# Patient Record
Sex: Male | Born: 1945 | Race: White | Hispanic: No | Marital: Married | Smoking: Former smoker
Health system: Southern US, Community
[De-identification: ages and names within clinical notes are randomized; demographics above are authoritative.]

## PROBLEM LIST (undated history)

## (undated) DIAGNOSIS — R7989 Other specified abnormal findings of blood chemistry: Secondary | ICD-10-CM

## (undated) DIAGNOSIS — R945 Abnormal results of liver function studies: Secondary | ICD-10-CM

## (undated) DIAGNOSIS — I350 Nonrheumatic aortic (valve) stenosis: Secondary | ICD-10-CM

## (undated) DIAGNOSIS — H269 Unspecified cataract: Secondary | ICD-10-CM

## (undated) DIAGNOSIS — R42 Dizziness and giddiness: Secondary | ICD-10-CM

## (undated) DIAGNOSIS — R0602 Shortness of breath: Secondary | ICD-10-CM

## (undated) DIAGNOSIS — H3581 Retinal edema: Secondary | ICD-10-CM

## (undated) DIAGNOSIS — Z953 Presence of xenogenic heart valve: Secondary | ICD-10-CM

## (undated) DIAGNOSIS — M1990 Unspecified osteoarthritis, unspecified site: Secondary | ICD-10-CM

## (undated) DIAGNOSIS — M48061 Spinal stenosis, lumbar region without neurogenic claudication: Secondary | ICD-10-CM

## (undated) DIAGNOSIS — K219 Gastro-esophageal reflux disease without esophagitis: Secondary | ICD-10-CM

## (undated) DIAGNOSIS — K08109 Complete loss of teeth, unspecified cause, unspecified class: Secondary | ICD-10-CM

## (undated) DIAGNOSIS — C61 Malignant neoplasm of prostate: Secondary | ICD-10-CM

## (undated) DIAGNOSIS — I10 Essential (primary) hypertension: Secondary | ICD-10-CM

## (undated) DIAGNOSIS — R0600 Dyspnea, unspecified: Secondary | ICD-10-CM

## (undated) DIAGNOSIS — F419 Anxiety disorder, unspecified: Secondary | ICD-10-CM

## (undated) DIAGNOSIS — R399 Unspecified symptoms and signs involving the genitourinary system: Secondary | ICD-10-CM

## (undated) DIAGNOSIS — I447 Left bundle-branch block, unspecified: Secondary | ICD-10-CM

## (undated) DIAGNOSIS — R739 Hyperglycemia, unspecified: Secondary | ICD-10-CM

## (undated) DIAGNOSIS — I442 Atrioventricular block, complete: Secondary | ICD-10-CM

## (undated) DIAGNOSIS — E039 Hypothyroidism, unspecified: Secondary | ICD-10-CM

## (undated) DIAGNOSIS — R011 Cardiac murmur, unspecified: Secondary | ICD-10-CM

## (undated) DIAGNOSIS — M5126 Other intervertebral disc displacement, lumbar region: Secondary | ICD-10-CM

## (undated) DIAGNOSIS — E785 Hyperlipidemia, unspecified: Secondary | ICD-10-CM

## (undated) HISTORY — DX: Dizziness and giddiness: R42

## (undated) HISTORY — DX: Other specified abnormal findings of blood chemistry: R79.89

## (undated) HISTORY — PX: CHOLECYSTECTOMY: SHX55

## (undated) HISTORY — DX: Abnormal results of liver function studies: R94.5

## (undated) HISTORY — DX: Hypothyroidism, unspecified: E03.9

## (undated) HISTORY — PX: COLONOSCOPY: SHX174

## (undated) HISTORY — DX: Unspecified osteoarthritis, unspecified site: M19.90

## (undated) HISTORY — PX: INGUINAL HERNIA REPAIR: SUR1180

## (undated) HISTORY — DX: Unspecified cataract: H26.9

## (undated) HISTORY — DX: Gastro-esophageal reflux disease without esophagitis: K21.9

## (undated) HISTORY — DX: Retinal edema: H35.81

## (undated) HISTORY — PX: EYE SURGERY: SHX253

## (undated) HISTORY — DX: Essential (primary) hypertension: I10

## (undated) HISTORY — DX: Hyperlipidemia, unspecified: E78.5

## (undated) HISTORY — PX: TONSILLECTOMY: SUR1361

## (undated) HISTORY — DX: Nonrheumatic aortic (valve) stenosis: I35.0

## (undated) HISTORY — DX: Atrioventricular block, complete: I44.2

## (undated) HISTORY — PX: POLYPECTOMY: SHX149

## (undated) HISTORY — DX: Hyperglycemia, unspecified: R73.9

## (undated) HISTORY — PX: CATARACT EXTRACTION W/ INTRAOCULAR LENS IMPLANT: SHX1309

---
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
## 2000-11-05 ENCOUNTER — Encounter: Payer: Self-pay | Admitting: Urology

## 2000-11-05 ENCOUNTER — Inpatient Hospital Stay (HOSPITAL_COMMUNITY): Admission: EM | Admit: 2000-11-05 | Discharge: 2000-11-11 | Payer: Self-pay | Admitting: Urology

## 2000-11-06 ENCOUNTER — Encounter: Payer: Self-pay | Admitting: Urology

## 2000-11-07 ENCOUNTER — Encounter: Payer: Self-pay | Admitting: Internal Medicine

## 2000-11-07 ENCOUNTER — Encounter: Payer: Self-pay | Admitting: Urology

## 2000-11-10 HISTORY — PX: CARDIAC CATHETERIZATION: SHX172

## 2000-12-29 ENCOUNTER — Other Ambulatory Visit: Admission: RE | Admit: 2000-12-29 | Discharge: 2000-12-29 | Payer: Self-pay | Admitting: Gastroenterology

## 2000-12-29 ENCOUNTER — Encounter (INDEPENDENT_AMBULATORY_CARE_PROVIDER_SITE_OTHER): Payer: Self-pay

## 2001-06-26 ENCOUNTER — Observation Stay (HOSPITAL_COMMUNITY): Admission: RE | Admit: 2001-06-26 | Discharge: 2001-06-27 | Payer: Self-pay | Admitting: General Surgery

## 2001-06-26 ENCOUNTER — Encounter: Payer: Self-pay | Admitting: General Surgery

## 2001-06-26 ENCOUNTER — Encounter (INDEPENDENT_AMBULATORY_CARE_PROVIDER_SITE_OTHER): Payer: Self-pay | Admitting: Specialist

## 2001-06-26 HISTORY — PX: LAPAROSCOPIC CHOLECYSTECTOMY: SUR755

## 2004-05-16 ENCOUNTER — Ambulatory Visit: Payer: Self-pay | Admitting: Internal Medicine

## 2004-07-25 ENCOUNTER — Ambulatory Visit: Payer: Self-pay | Admitting: Internal Medicine

## 2004-10-23 ENCOUNTER — Ambulatory Visit: Payer: Self-pay | Admitting: Internal Medicine

## 2004-10-29 ENCOUNTER — Ambulatory Visit: Payer: Self-pay | Admitting: Internal Medicine

## 2005-02-22 ENCOUNTER — Ambulatory Visit: Payer: Self-pay | Admitting: Internal Medicine

## 2005-04-30 ENCOUNTER — Ambulatory Visit: Payer: Self-pay | Admitting: Internal Medicine

## 2005-08-12 ENCOUNTER — Ambulatory Visit: Payer: Self-pay | Admitting: Internal Medicine

## 2005-08-26 ENCOUNTER — Ambulatory Visit: Payer: Self-pay | Admitting: Internal Medicine

## 2006-03-10 ENCOUNTER — Ambulatory Visit: Payer: Self-pay | Admitting: Internal Medicine

## 2006-03-27 ENCOUNTER — Ambulatory Visit: Payer: Self-pay | Admitting: Gastroenterology

## 2006-04-10 ENCOUNTER — Encounter: Payer: Self-pay | Admitting: Internal Medicine

## 2006-04-10 ENCOUNTER — Ambulatory Visit: Payer: Self-pay | Admitting: Gastroenterology

## 2006-05-05 ENCOUNTER — Ambulatory Visit: Payer: Self-pay | Admitting: Internal Medicine

## 2006-05-05 LAB — CONVERTED CEMR LAB
ALT: 39 units/L (ref 0–40)
AST: 41 units/L — ABNORMAL HIGH (ref 0–37)
Chol/HDL Ratio, serum: 3.8
Cholesterol: 163 mg/dL (ref 0–200)
HDL: 42.6 mg/dL (ref >39.0)
LDL DIRECT: 103.4 mg/dL
Total CK: 234 units/L (ref 7–195)
Triglyceride fasting, serum: 115 mg/dL (ref 0–149)
VLDL: 23 mg/dL (ref 0–40)

## 2006-06-06 ENCOUNTER — Ambulatory Visit: Payer: Self-pay | Admitting: Internal Medicine

## 2006-06-06 LAB — CONVERTED CEMR LAB: Total CK: 159 units/L (ref 7–195)

## 2006-06-10 HISTORY — PX: INGUINAL HERNIA REPAIR: SUR1180

## 2006-06-10 HISTORY — PX: CHOLECYSTECTOMY: SHX55

## 2006-07-29 ENCOUNTER — Ambulatory Visit: Payer: Self-pay | Admitting: Family Medicine

## 2006-08-20 ENCOUNTER — Ambulatory Visit: Payer: Self-pay | Admitting: Family Medicine

## 2007-04-08 DIAGNOSIS — I10 Essential (primary) hypertension: Secondary | ICD-10-CM | POA: Insufficient documentation

## 2007-04-13 ENCOUNTER — Ambulatory Visit: Payer: Self-pay | Admitting: Internal Medicine

## 2007-08-24 ENCOUNTER — Telehealth (INDEPENDENT_AMBULATORY_CARE_PROVIDER_SITE_OTHER): Payer: Self-pay | Admitting: *Deleted

## 2007-10-13 ENCOUNTER — Ambulatory Visit: Payer: Self-pay | Admitting: Internal Medicine

## 2007-10-13 DIAGNOSIS — E785 Hyperlipidemia, unspecified: Secondary | ICD-10-CM | POA: Insufficient documentation

## 2007-10-13 DIAGNOSIS — E039 Hypothyroidism, unspecified: Secondary | ICD-10-CM

## 2007-10-15 LAB — CONVERTED CEMR LAB
ALT: 59 units/L — ABNORMAL HIGH (ref 0–53)
AST: 52 units/L — ABNORMAL HIGH (ref 0–37)
Alkaline Phosphatase: 58 units/L (ref 39–117)
Total Bilirubin: 1.6 mg/dL — ABNORMAL HIGH (ref 0.3–1.2)

## 2008-04-07 ENCOUNTER — Ambulatory Visit: Payer: Self-pay | Admitting: Internal Medicine

## 2008-04-12 LAB — CONVERTED CEMR LAB
ALT: 45 units/L (ref 0–53)
AST: 41 units/L — ABNORMAL HIGH (ref 0–37)
Alkaline Phosphatase: 67 units/L (ref 39–117)
Bilirubin, Direct: 0.2 mg/dL (ref 0.0–0.3)
CO2: 27 meq/L (ref 19–32)
Chloride: 106 meq/L (ref 96–112)
Eosinophils Relative: 0.7 % (ref 0.0–5.0)
GFR calc Af Amer: 110 mL/min
Glucose, Bld: 95 mg/dL (ref 70–99)
HDL: 42.3 mg/dL (ref >39.0)
LDL Cholesterol: 79 mg/dL (ref 0–99)
Lymphocytes Relative: 22.8 % (ref 12.0–46.0)
Monocytes Relative: 9 % (ref 3.0–12.0)
Neutrophils Relative %: 67.3 % (ref 43.0–77.0)
Platelets: 240 10*3/uL (ref 150–400)
Potassium: 3.7 meq/L (ref 3.5–5.1)
RDW: 12 % (ref 11.5–14.6)
Saturation Ratios: 18.9 % — ABNORMAL LOW (ref 20.0–50.0)
Sodium: 142 meq/L (ref 135–145)
TSH: 3.08 microintl units/mL (ref 0.35–5.50)
Total CHOL/HDL Ratio: 3.5
Total Protein: 8.1 g/dL (ref 6.0–8.3)
Triglycerides: 126 mg/dL (ref 0–149)
WBC: 5.6 10*3/uL (ref 4.5–10.5)

## 2008-10-10 ENCOUNTER — Ambulatory Visit: Payer: Self-pay | Admitting: Internal Medicine

## 2008-10-13 ENCOUNTER — Telehealth (INDEPENDENT_AMBULATORY_CARE_PROVIDER_SITE_OTHER): Payer: Self-pay | Admitting: *Deleted

## 2008-10-13 LAB — CONVERTED CEMR LAB
PSA: 1 ng/mL (ref 0.10–4.00)
TSH: 4.52 microintl units/mL (ref 0.35–5.50)

## 2008-11-25 ENCOUNTER — Ambulatory Visit: Payer: Self-pay | Admitting: Internal Medicine

## 2009-04-13 ENCOUNTER — Ambulatory Visit: Payer: Self-pay | Admitting: Internal Medicine

## 2009-04-17 ENCOUNTER — Encounter (INDEPENDENT_AMBULATORY_CARE_PROVIDER_SITE_OTHER): Payer: Self-pay | Admitting: *Deleted

## 2009-06-10 DIAGNOSIS — Z8679 Personal history of other diseases of the circulatory system: Secondary | ICD-10-CM | POA: Insufficient documentation

## 2009-06-10 HISTORY — DX: Personal history of other diseases of the circulatory system: Z86.79

## 2009-10-11 ENCOUNTER — Ambulatory Visit: Payer: Self-pay | Admitting: Internal Medicine

## 2009-11-09 ENCOUNTER — Ambulatory Visit: Payer: Self-pay | Admitting: Internal Medicine

## 2009-11-09 ENCOUNTER — Encounter: Payer: Self-pay | Admitting: Internal Medicine

## 2009-11-09 ENCOUNTER — Ambulatory Visit: Payer: Self-pay

## 2009-11-09 ENCOUNTER — Ambulatory Visit (HOSPITAL_COMMUNITY): Admission: RE | Admit: 2009-11-09 | Discharge: 2009-11-09 | Payer: Self-pay | Admitting: Internal Medicine

## 2010-04-13 ENCOUNTER — Ambulatory Visit: Payer: Self-pay | Admitting: Internal Medicine

## 2010-04-13 DIAGNOSIS — M25519 Pain in unspecified shoulder: Secondary | ICD-10-CM | POA: Insufficient documentation

## 2010-04-17 LAB — CONVERTED CEMR LAB
ALT: 31 units/L (ref 0–53)
BUN: 13 mg/dL (ref 6–23)
Basophils Absolute: 0 10*3/uL (ref 0.0–0.1)
Basophils Relative: 0.5 % (ref 0.0–3.0)
Creatinine, Ser: 0.9 mg/dL (ref 0.4–1.5)
Eosinophils Absolute: 0 10*3/uL (ref 0.0–0.7)
Eosinophils Relative: 1.2 % (ref 0.0–5.0)
GFR calc non Af Amer: 89.08 mL/min (ref >60)
HDL: 41.9 mg/dL (ref >39.00)
Lymphs Abs: 1.1 10*3/uL (ref 0.7–4.0)
MCV: 94.1 fL (ref 78.0–100.0)
Monocytes Relative: 10.1 % (ref 3.0–12.0)
Neutro Abs: 2.1 10*3/uL (ref 1.4–7.7)
Neutrophils Relative %: 57.3 % (ref 43.0–77.0)
PSA: 1.16 ng/mL (ref 0.10–4.00)
Platelets: 215 10*3/uL (ref 150.0–400.0)
Total CHOL/HDL Ratio: 4
VLDL: 27.2 mg/dL (ref 0.0–40.0)
WBC: 3.7 10*3/uL — ABNORMAL LOW (ref 4.5–10.5)

## 2010-07-08 LAB — CONVERTED CEMR LAB
ALT: 42 units/L (ref 0–53)
ALT: 69 units/L — ABNORMAL HIGH (ref 0–53)
AST: 63 units/L — ABNORMAL HIGH (ref 0–37)
BUN: 11 mg/dL (ref 6–23)
Basophils Absolute: 0 10*3/uL (ref 0.0–0.1)
Basophils Relative: 0.4 % (ref 0.0–1.0)
CO2: 31 meq/L (ref 19–32)
Calcium: 9.5 mg/dL (ref 8.4–10.5)
Chloride: 103 meq/L (ref 96–112)
Cholesterol: 155 mg/dL (ref 0–200)
Creatinine, Ser: 1 mg/dL (ref 0.4–1.5)
Eosinophils Absolute: 0.1 10*3/uL (ref 0.0–0.6)
Eosinophils Relative: 1 % (ref 0.0–5.0)
Eosinophils Relative: 1.2 % (ref 0.0–5.0)
GFR calc Af Amer: 98 mL/min
GFR calc non Af Amer: 80.15 mL/min (ref >60)
GFR calc non Af Amer: 81 mL/min
Glucose, Bld: 101 mg/dL — ABNORMAL HIGH (ref 70–99)
Glucose, Bld: 109 mg/dL — ABNORMAL HIGH (ref 70–99)
HCT: 42.1 % (ref 39.0–52.0)
HCT: 46.7 % (ref 39.0–52.0)
HDL: 37.9 mg/dL — ABNORMAL LOW (ref >39.0)
Hemoglobin: 14.4 g/dL (ref 13.0–17.0)
Hemoglobin: 15.7 g/dL (ref 13.0–17.0)
LDL Cholesterol: 80 mg/dL (ref 0–99)
LDL Cholesterol: 81 mg/dL (ref 0–99)
Lymphocytes Relative: 15.4 % (ref 12.0–46.0)
Lymphs Abs: 1 10*3/uL (ref 0.7–4.0)
MCHC: 33.6 g/dL (ref 30.0–36.0)
MCV: 93.6 fL (ref 78.0–100.0)
Monocytes Absolute: 0.7 10*3/uL (ref 0.2–0.7)
Monocytes Relative: 11.7 % (ref 3.0–12.0)
Monocytes Relative: 8.8 % (ref 3.0–11.0)
Neutro Abs: 3.1 10*3/uL (ref 1.4–7.7)
Neutro Abs: 6 10*3/uL (ref 1.4–7.7)
Neutrophils Relative %: 74.4 % (ref 43.0–77.0)
PSA, Free Pct: 34 (ref >25)
PSA, Free: 0.3 ng/mL
PSA: 0.87 ng/mL (ref 0.10–4.00)
PSA: 1 ng/mL (ref 0.10–4.00)
Platelets: 251 10*3/uL (ref 150–400)
Potassium: 3.7 meq/L (ref 3.5–5.1)
Potassium: 3.8 meq/L (ref 3.5–5.1)
RBC: 4.98 M/uL (ref 4.22–5.81)
RDW: 11.9 % (ref 11.5–14.6)
RDW: 12.2 % (ref 11.5–14.6)
Sodium: 141 meq/L (ref 135–145)
Sodium: 142 meq/L (ref 135–145)
TSH: 3.05 microintl units/mL (ref 0.35–5.50)
TSH: 5.75 microintl units/mL — ABNORMAL HIGH (ref 0.35–5.50)
Total Bilirubin: 1.7 mg/dL — ABNORMAL HIGH (ref 0.3–1.2)
Total CHOL/HDL Ratio: 3
Total CHOL/HDL Ratio: 4.1
Total CK: 136 units/L (ref 7–195)
Triglycerides: 180 mg/dL — ABNORMAL HIGH (ref 0–149)
VLDL: 20.8 mg/dL (ref 0.0–40.0)
VLDL: 36 mg/dL (ref 0–40)
WBC: 4.7 10*3/uL (ref 4.5–10.5)
WBC: 8 10*3/uL (ref 4.5–10.5)

## 2010-07-10 NOTE — Assessment & Plan Note (Signed)
Summary: 6 MONTH FOLLOWUP///SPH   Vital Signs:  Patient profile:   65 year old male Height:      68 inches Weight:      192.8 pounds BMI:     29.42 Pulse rate:   74 / minute BP sitting:   140 / 92  Vitals Entered By: Shary Decamp (Oct 11, 2009 8:21 AM) CC: rov, fasting Comments  - pt is concerned because he has heard about lawsuits on TV [id number] Shary Decamp  Oct 11, 2009 8:21 AM    History of Present Illness: --pt is concerned because he has heard about lawsuits on TV [id number] --Bp slightly  elevated today, was elevated last time as well   Current Medications (verified): 1)  Levothyroxine Sodium 75 Mcg Tabs (Levothyroxine Sodium) .Marland Kitchen.. 1 By Mouth Once Daily 2)  Metoprolol Tartrate 50 Mg  Tabs (Metoprolol Tartrate) .Marland Kitchen.. 1 By Mouth Bid 3)  Nifedical Xl 30 Mg  Tb24 (Nifedipine) .Marland Kitchen.. 1 By Mouth Qd 4)  Aspirin 81 Mg  Tbec (Aspirin) 5)  Crestor 20 Mg  Tabs (Rosuvastatin Calcium) .Marland Kitchen.. 1 By Mouth Qd  Allergies (verified): 1)  ! * Zetia 2)  ! Lipitor  Past History:  Past Medical History: Reviewed history from 10/10/2008 and no changes required. Hypertension Hyperlipidemia abnormal LFTs Hypothyroidism  Past Surgical History: Reviewed history from 04/13/2007 and no changes required. Cholecystectomy Cardiac Cath. (11-10-00) Tonsillectomy Inguinal herniorrhaphy (B)  Social History: Reviewed history from 04/13/2009 and no changes required. Married 3 children Retired 12-09, enjoying!, traveling  exercise-- remains active diet- does try to eat healthy  tobacco--no ETOH--no   Review of Systems CV:  Denies chest pain or discomfort and swelling of feet; no dyspnea on exertion. Resp:  Denies cough. MS:  Denies muscle aches and cramps.  Physical Exam  General:  alert, well-developed, and well-nourished.   Lungs:  normal respiratory effort, no intercostal retractions, no accessory muscle use, and normal breath sounds.   Heart:  normal rate and regular rhythm.   II/VI systolic murmur, better heard at the  right upper sternum Extremities:  no pretibial edema bilaterally    Impression & Recommendations:  Problem # 1:  SYSTOLIC MURMUR (KXF-818.2)  patient is asymptomatic  check an echocardiogram  Orders: Cardiology Referral (Cardiology)  Problem # 2:  HYPOTHYROIDISM (ICD-244.9)  His updated medication list for this problem includes:    Levothyroxine Sodium 75 Mcg Tabs (Levothyroxine sodium) .Marland Kitchen... 1 by mouth once daily  Orders: Venipuncture (99371) TLB-TSH (Thyroid Stimulating Hormone) (84443-TSH)  Problem # 3:  HYPERLIPIDEMIA (ICD-272.4) printed material provided regards Crestor from up-to-date provided. At this point   I think he gets more benefits  than risks from cholesterol  medication.  His updated medication list for this problem includes:    Crestor 20 Mg Tabs (Rosuvastatin calcium) .Marland Kitchen... 1 by mouth qd  Labs Reviewed: SGOT: 64 (04/13/2009)   SGPT: 42 (04/13/2009)   HDL:44.50 (04/13/2009), 42.3 (04/07/2008)  LDL:80 (04/13/2009), 79 (04/07/2008)  Chol:145 (04/13/2009), 146 (04/07/2008)  Trig:104.0 (04/13/2009), 126 (04/07/2008)  Problem # 4:  HYPERTENSION (ICD-401.9) BP recheck today, 130/70.  No change His updated medication list for this problem includes:    Metoprolol Tartrate 50 Mg Tabs (Metoprolol tartrate) .Marland Kitchen... 1 by mouth bid    Nifedical Xl 30 Mg Tb24 (Nifedipine) .Marland Kitchen... 1 by mouth qd  BP today: 140/92 Prior BP: 160/90 (04/13/2009)  Labs Reviewed: K+: 3.7 (04/13/2009) Creat: : 1.0 (04/13/2009)   Chol: 145 (04/13/2009)   HDL: 44.50 (04/13/2009)  LDL: 80 (04/13/2009)   TG: 104.0 (04/13/2009)  Complete Medication List: 1)  Levothyroxine Sodium 75 Mcg Tabs (Levothyroxine sodium) .Marland Kitchen.. 1 by mouth once daily 2)  Metoprolol Tartrate 50 Mg Tabs (Metoprolol tartrate) .Marland Kitchen.. 1 by mouth bid 3)  Nifedical Xl 30 Mg Tb24 (Nifedipine) .Marland Kitchen.. 1 by mouth qd 4)  Aspirin 81 Mg Tbec (Aspirin) 5)  Crestor 20 Mg Tabs (Rosuvastatin calcium)  .Marland Kitchen.. 1 by mouth qd  Patient Instructions: 1)  Please schedule a follow-up appointment in 6 months .( fasting, yearly checkup)

## 2010-07-10 NOTE — Assessment & Plan Note (Signed)
Summary: yearly check & lab/cbs   Vital Signs:  Patient profile:   65 year old male Height:      68 inches Weight:      195.38 pounds Pulse rate:   78 / minute Pulse rhythm:   regular BP sitting:   126 / 82  (left arm) Cuff size:   large  Vitals Entered By: Army Fossa CMA (April 13, 2010 7:58 AM) CC: CPX, fasting  Comments c/o (L) shoulder pain x 1 year.  [facility] [address]   History of Present Illness: CPX left shoulder pain for one month, on -off,  worse after periods of inactivity.    Preventive Screening-Counseling & Management  Caffeine-Diet-Exercise     Does Patient Exercise: no  Current Medications (verified): 1)  Levothyroxine Sodium 75 Mcg Tabs (Levothyroxine Sodium) .Marland Kitchen.. 1 By Mouth Once Daily 2)  Metoprolol Tartrate 50 Mg  Tabs (Metoprolol Tartrate) .Marland Kitchen.. 1 By Mouth Bid 3)  Nifedical Xl 30 Mg  Tb24 (Nifedipine) .Marland Kitchen.. 1 By Mouth Qd 4)  Aspirin 81 Mg  Tbec (Aspirin) 5)  Crestor 20 Mg  Tabs (Rosuvastatin Calcium) .Marland Kitchen.. 1 By Mouth Qd  Allergies (verified): 1)  ! * Zetia 2)  ! Lipitor  Past History:  Past Medical History: Reviewed history from 10/10/2008 and no changes required. Hypertension Hyperlipidemia abnormal LFTs Hypothyroidism  Past Surgical History: Reviewed history from 04/13/2007 and no changes required. Cholecystectomy Cardiac Cath. (11-10-00) Tonsillectomy Inguinal herniorrhaphy (B)  Family History: Reviewed history from 04/07/2008 and no changes required.  Diabetes-- M , ?B CAD Male 1st degree relative <50, brother had CABG in his 42s stroke-- mother colon ca--no prostate ca--no  Social History: Married 3 children Retired 12-09 exercise-- remains active diet- -- regular  tobacco--no ETOH--no Does Patient Exercise:  no  Review of Systems General:  Denies fatigue, fever, and weight loss. CV:  Denies chest pain or discomfort and swelling of feet. Resp:  Denies cough and shortness of breath. GI:  Denies bloody stools,  nausea, and vomiting. GU:  Denies dysuria, hematuria, urinary frequency, and urinary hesitancy. Psych:  Denies anxiety and depression.  Physical Exam  General:  alert, well-developed, and well-nourished.   Neck:  no masses and no thyromegaly.   Lungs:  normal respiratory effort, no intercostal retractions, no accessory muscle use, and normal breath sounds.   Heart:  normal rate and regular rhythm.  Very mild  systolic murmur, better heard at the  right upper sternum Abdomen:  soft, non-tender, no distention, no masses, and no guarding.   Rectal:  No external abnormalities noted. Normal sphincter tone. No rectal masses or tenderness. Prostate:  Prostate gland firm and smooth, no enlargement, nodularity, tenderness, mass, asymmetry or induration. Msk:  right shoulder normal Left shoulder without deformities, slightly painful with elevation Extremities:  no lower  extremity edema   Impression & Recommendations:  Problem # 1:  HEALTH SCREENING (ICD-V70.0)  Td 2002 got a  flu shot  per  patient: Cscope x 2, last  04-2006, next 2014 (Dr. Arlyce Dice) diet and exercise discussed    Orders: Venipuncture (16109) TLB-BMP (Basic Metabolic Panel-BMET) (80048-METABOL) TLB-CBC Platelet - w/Differential (85025-CBCD) TLB-Lipid Panel (80061-LIPID) TLB-PSA (Prostate Specific Antigen) (84153-PSA) TLB-ALT (SGPT) (84460-ALT) TLB-AST (SGOT) (84450-SGOT) Specimen Handling (60454)  Problem # 2:  SHOULDER PAIN (ICD-719.41) left shoulder pain for one month, suspect rotator cuff tendinitis. Prednisone for a few days, Tylenol or Motrin. Will call in few weeks if no better for  referral His updated medication list for this problem  includes:    Aspirin 81 Mg Tbec (Aspirin)  Complete Medication List: 1)  Levothyroxine Sodium 75 Mcg Tabs (Levothyroxine sodium) .Marland Kitchen.. 1 by mouth once daily 2)  Metoprolol Tartrate 50 Mg Tabs (Metoprolol tartrate) .Marland Kitchen.. 1 by mouth bid 3)  Nifedical Xl 30 Mg Tb24 (Nifedipine)  .Marland Kitchen.. 1 by mouth qd 4)  Aspirin 81 Mg Tbec (Aspirin) 5)  Crestor 20 Mg Tabs (Rosuvastatin calcium) .Marland Kitchen.. 1 by mouth qd 6)  Prednisone 10 Mg Tabs (Prednisone) .... 4 by mouth x 2 days, 3x2,2x2, 1x2  Patient Instructions: 1)  take prednisone as prescribed for 8 days 2)  For pain take either Motrin 200 mg over-the-counter 2 tablets every 8 hours as needed. take with food 3)  You may also try Tylenol 500 mg 2 tablets every 6 hours as needed 4)  Call if the pain is not better in 3 weeks 5)  schedule a follow-up appointment in 6 months .  Prescriptions: PREDNISONE 10 MG TABS (PREDNISONE) 4 by mouth x 2 days, 3x2,2x2, 1x2  #20 x 0   Entered and Authorized by:   Elita Quick E. Paz MD   Signed by:   Nolon Rod. Paz MD on 04/13/2010   Method used:   [facility] to        [facility] [phone][facility] (retail)       [address]       [place], [place]  [address]       Ph: [phone]       Fax: [phone]   RxID:   [id number] CRESTOR 20 MG  TABS (ROSUVASTATIN CALCIUM) 1 by mouth qd  #30 Tablet x 6   Entered by:   Army Fossa CMA   Authorized by:   Nolon Rod. Paz MD   Signed by:   Army Fossa CMA on 04/13/2010   Method used:   [facility] to        [facility] [phone]* (retail)       [address]       [place], [place]  [address]       Ph: [phone]       Fax: [phone]   RxID:   [id number] METOPROLOL TARTRATE 50 MG  TABS (METOPROLOL TARTRATE) 1 by mouth bid  #60 Tablet x 6   Entered by:   Army Fossa CMA   Authorized by:   Nolon Rod. Paz MD   Signed by:   Army Fossa CMA on 04/13/2010   Method used:   [facility]ronically to        [facility] [phone]* (retail)       [address]       [place], [place]  [address]       Ph: [phone]       Fax: [phone]   RxID:   [id number] NIFEDICAL XL 30 MG  TB24 (NIFEDIPINE) 1 by mouth qd  #30 Tablet x 6   Entered by:   Army Fossa  CMA   Authorized by:   Nolon Rod. Paz MD   Signed by:   Army Fossa CMA on 04/13/2010   Method used:   Electronically to        [facility] [phone]* (retail)       [address]       [place],  [place]  [address]       Ph: [phone]       Fax: [phone]   RxID:   [id number] LEVOTHYROXINE SODIUM 75 MCG TABS (LEVOTHYROXINE SODIUM) 1 by mouth once daily  #30 Tablet x 6   Entered by:   Army Fossa CMA   Authorized by:   Nolon Rod. Paz MD   Signed by:   Army Fossa CMA on 04/13/2010   Method used:   Electronically to        [facility] [phone]* (retail)       [address]       [place], [place]  [address]       Ph: [phone]       Fax: [phone]   RxID:   [id number]    Orders Added: 1)  Venipuncture [17616] 2)  TLB-BMP (Basic Metabolic Panel-BMET) [80048-METABOL] 3)  TLB-CBC Platelet - w/Differential [85025-CBCD] 4)  TLB-Lipid Panel [80061-LIPID] 5)  TLB-PSA (Prostate Specific Antigen) [84153-PSA] 6)  TLB-ALT (SGPT) [84460-ALT] 7)  TLB-AST (SGOT) [84450-SGOT] 8)  Specimen Handling [99000] 9)  Est. Patient age 25-64 [99396]   Immunization History:  Influenza Immunization History:    Influenza:  historical (03/09/2010)   Immunization History:  Influenza Immunization History:    Influenza:  Historical (03/09/2010)    Risk Factors:  Exercise:  no

## 2010-08-25 ENCOUNTER — Encounter: Payer: Self-pay | Admitting: Internal Medicine

## 2010-10-15 ENCOUNTER — Encounter: Payer: Self-pay | Admitting: Internal Medicine

## 2010-10-15 ENCOUNTER — Ambulatory Visit (INDEPENDENT_AMBULATORY_CARE_PROVIDER_SITE_OTHER): Payer: 59 | Admitting: Internal Medicine

## 2010-10-15 ENCOUNTER — Telehealth: Payer: Self-pay | Admitting: Internal Medicine

## 2010-10-15 DIAGNOSIS — E785 Hyperlipidemia, unspecified: Secondary | ICD-10-CM

## 2010-10-15 DIAGNOSIS — I10 Essential (primary) hypertension: Secondary | ICD-10-CM

## 2010-10-15 DIAGNOSIS — E039 Hypothyroidism, unspecified: Secondary | ICD-10-CM

## 2010-10-15 NOTE — Telephone Encounter (Signed)
error 

## 2010-10-15 NOTE — Assessment & Plan Note (Signed)
Well controlled, no s/e. No change

## 2010-10-15 NOTE — Assessment & Plan Note (Signed)
BP today slt elevated, amb BPs wnl. No change

## 2010-10-15 NOTE — Progress Notes (Signed)
  Subjective:    Patient ID: Eric [name], male    DOB: 12-11-1945, 65 y.o.   MRN: [id number]  HPI 6 month office visit him a doing well.  Past Medical History  Diagnosis Date  . Hypertension   . Hyperlipidemia   . Abnormal LFTs   . Hypothyroidism     Past Surgical History  Procedure Date  . Cholecystectomy   . Cardiac catheterization 11/10/00  . Tonsillectomy   . Inguinal hernia repair     (B)      Review of Systems Good medication compliance with BP meds, ambulatory blood pressures are checked from time to time, they run ~ 128/75. Good medications compliance with thyroid medicine. Takes Crestor daily, denies any myalgias or other side effects.    Objective:   Physical Exam Alert oriented x3 No apparent distress Lungs, clear to auscultation bilaterally. Cardiovascular, regular rate and rhythm without murmur. Extremities, no edema       Assessment & Plan:

## 2010-10-15 NOTE — Assessment & Plan Note (Signed)
Good compliance Labs

## 2010-10-18 ENCOUNTER — Telehealth: Payer: Self-pay | Admitting: *Deleted

## 2010-10-18 NOTE — Telephone Encounter (Signed)
Pt is aware.  

## 2010-10-18 NOTE — Telephone Encounter (Signed)
Message left for patient to return my call.  

## 2010-10-18 NOTE — Telephone Encounter (Signed)
Message copied by Army Fossa on Thu Oct 18, 2010 10:50 AM ------      Message from: Eric [name]      Created: Wed Oct 17, 2010  5:07 PM       Advise patient:      Thyroid ok, cont same meds, rechecked when he comes back in 6 months

## 2010-10-26 NOTE — Discharge Summary (Signed)
[facility]  Patient:    Eric [name], Eric [name]                       MRN: [id number] Adm. Date:  [id number] Disc. Date: [id number] Attending:  Laqueta Jean                           Discharge Summary  FINAL DIAGNOSES: 1. Escherichia coli, left epididymo-orchitis. 2. Gallstones. 3. Elevated liver function tests. 4. Left inguinal hernia. 5. Abnormal electrocardiogram.  OPERATIONS:  Cardiac catheterization on November 10, 2000.  HISTORY OF PRESENT ILLNESS:  Eric [name] is a 65 year old white married male, [employer] employee, with a several-month history of left groin discomfort, felt secondary to a known left inguinal hernia.  The patient developed acute left testicular pain three days prior to admission, with increasing temperature of 101.3 the day prior to admission, treated with Tylenol.  On the day of admission the patient had a temperature of 104, with severe left testicular pain, inability to walk.  The patient had severe fever and chills and was admitted to the hospital for IV antibiotics.  ALLERGIES:  None known.  MEDICATIONS:  None.  PAST MEDICAL HISTORY: 1. Left inguinal hernia. 2. Recurrent upper respiratory infections.  TOBACCO/ALCOHOL:  Cigarettes, 48-pack-year history.  Alcohol, none.  SOCIAL HISTORY:  The patients mother died secondary to diabetic complications of end-stage renal disease.  Father died secondary to COPD.  PHYSICAL EXAMINATION:  Admission vital signs show a temperature of 104. Remaining physical examination is as noted on H&P of Nov 05, 2000.  ADMISSION LABORATORY DATA:  Office urine culture from Nov 05, 2000, showed E. coli, greater than 100,000, sensitive to Cipro.  In addition, note the patient has a white blood cell count of 11,700, hemoglobin 13.5, hematocrit 39.2, platelet count 241,000.  At discharge his white blood cell count was down at 9900, hemoglobin 12.3, hematocrit 36.4.  The PT on admission is  14.2, PTT is 37.  Serum sodium 133, potassium 3.2, chloride 100, CO2 23, BUN 13, creatinine 1.1.  Note the alkaline phosphatase elevated at 123, bilirubin at 1.9. Amylase 60, lipase 13.  Hemoglobin A1C 5.1, with repeat 5.2.  Cholesterol 225 fasting.  Triglycerides 169.  CK enzymes show an MB band of less than 0.3, repeat of 2.5 (both within normal limits).  The patients HDL cholesterol 40, cholesterol ratio of 5.6, LDL cholesterol 151 (average risk).  His hepatitis surface antigen is negative.  Hepatitis C antibody negative.  Blood cultures show no growth.  Urinalysis shows specific gravity 1.024, pH 5.5, hemoglobin large, ketones mild, nitrates positive, 4-6 white blood cells, 4-6 red blood cells, bacteria rare.  Note, iron is low at 33 mcg, and transferrin levels are low at 173.  ANA qualitative is pending.  HOSPITAL COURSE:  The patient was admitted to the hospital on IV antibiotics. He was seen by Dr. Romero Belling of the [facility] Internal Medicine service.  He was evaluated and found not to be a diabetic, but he did have glucose intolerance because of urosepsis.  In addition, the patient had an abnormal EKG, with cardiac catheterization on November 10, 2000, showing no evidence of cardiac disease.  The patient had liver function tests elevated and was found to have gallstones, with one gallstone possibly plugging the duct.  However, the patient was seen by Dr. Thornton Dales in consultation and will undergo elective cholecystectomy after he completely clears from his  testicular illness.  The patient will be discharged in the a.m. on Cipro 500 mg b.i.d. for three weeks.  He will be reevaluated to see if he can have his cholecystectomy.  He will eventually need to have his left inguinal hernia repaired as well.  The patient will be discharged on Lopressor and one aspirin per day as well as Cipro (Lopressor is 50 mg b.i.d.).  He is also given a prescription for Lorcet Plus p.r.n. pain.  He is  discharged in improved condition.  Planned discharge in the a.m. if cleared by appropriate services. DD:  11/10/00 TD:  11/11/00 Job: [id number]

## 2010-10-26 NOTE — H&P (Signed)
[facility]  Patient:    Eric [name], Eric [name]                       MRN: [id number] Adm. Date:  [id number] Attending:  Laqueta Jean CC:         Titus Dubin. Alwyn Ren, M.D. [facility]   History and Physical  HISTORY OF PRESENT ILLNESS:  Mr. Doubleday is a 65 year old, white, married male, [employer] employee, with a several month history of left groin discomfort, known left inguinal hernia, and acute discomfort on Sunday prior to evaluation, with Sunday night and Monday morning with temperature of 101.3. He treated himself with Tylenol, but because of increasing left testicular pain, saw Dr. Alwyn Ren on Tuesday morning, with a history of orchitis and referred to urology for evaluation.  ALLERGIES:  No known drug allergies.  MEDICATIONS:  Aleve p.r.n. and Tylenol p.r.n.  PAST MEDICAL HISTORY: 1. Left inguinal hernia. 2. Upper respiratory infections.  SOCIAL HISTORY:  Tobacco:  48-pack-year history.  Alcohol:  None.  FAMILY HISTORY:  Mother died secondary to diabetic nephropathy.  Father died secondary to COPD.  ADMISSION PHYSICAL EXAMINATION:  VITAL SIGNS:  Temperature 104.5, heart rate 125, respiratory rate 20, blood pressure 141/78.  Weight 180 pounds.  HEENT:  PERRLA.  EOM full.  NECK:  Supple, nontender, and no nodes.  LUNGS:  Chest clear to auscultation and percussion.  ABDOMEN:  Soft, positive bowel sounds without organomegaly or masses.  GENITALIA:  Testicles descended bilaterally.  The left testicle is erythematous, hard, and very tender.  I cannot separate the epididymis from the testicle.  The mass measures approximately 7 cm.  RECTAL:  Normal sphincter tone.  Prostate 3+, tender.  There is no blood or masses.  EXTREMITIES:  Without cyanosis or edema.  NEUROLOGIC:  Physiologic.  ADMITTING IMPRESSION:  Acute left orchitis. DD:  11/10/00 TD:  11/11/00 Job: [id number]

## 2010-10-26 NOTE — Op Note (Signed)
[facility]  Patient:    Eric [name], Eric [name] Visit Number: [id number] MRN: [id number]          Service Type: SUR Location: [id number] Attending Physician:  Henrene Dodge Dictated by:   Anselm Pancoast. Zachery Dakins, M.D. Proc. Date: 06/26/01 Admit Date:  06/26/2001 Discharge Date: 06/27/2001   CC:         Sigmund I. Patsi Sears, M.D.   Operative Report  PREOPERATIVE DIAGNOSES: 1. Chronic cholecystitis with stones. 2. Left inguinal hernia, direct.  OPERATION: 1. Laparoscopic cholecystectomy with cholangiogram. 2. Left inguinal hernia direct open with mesh reinforcement.  SURGEON:  Anselm Pancoast. Zachery Dakins, M.D.  ASSISTANT:  Zigmund Daniel, M.D.  ANESTHESIA:  General.  HISTORY:  Eric [name] is a 65 year old male, whom I first met approximately six months ago when was admitted by Dr. Patsi Sears with severe left epididymitis.  The patient had had epigastric pain, whether it was though to be referral or not was originally not sure, but he was evaluated and found to have stones within a gallbladder with some impacted in the cystic neck, even though he was not acutely tender in the right upper quadrant.  Because of the severe epididymitis, we elected not to proceed on with surgery at that time. He has ultimately developed a direct left inguinal hernia and has been followed in the office.  His testicle has returned to normal size, and he desired to have procedures done simultaneously if possible.  We elected to proceed with the cholecystectomy first with the laparoscope and if there was one or no bile spillage and felt to be a very low instance of infection, we would proceed on with the left inguinal hernia.  The patient understands this, and he was given 3 g of Unasyn preoperatively.  DESCRIPTION OF PROCEDURE:  The patient was taken to the operative suite, PAS stockings, and then positioned on the OR table, induction of general anesthesia  endotracheal tube.  The abdomen, and he is quite hairy, was shaved around his umbilicus, his subxiphoid area, and the left inguinal hernia and then prepped with Betadine surgical solution and draped in a sterile manner for the gallbladder.  A small incision was made, the fascia identified below the umbilicus and then this picked up with two Kochers, a small opening made into the peritoneal cavity.  Hasson cannula was introduced after a traction suture had been placed and pursestring, and then the upper 10 mm trocar was placed under direct vision in the subxiphoid area and anesthetized the fascia, and the two lateral 5 mm ports were placed by Dr. Orson Slick.  The patient has a gallbladder that is very high-lying because of his body shape, and the gallbladder did have significant adhesions around it, but it was not acutely inflamed at this time.  We carefully peeled the omentum off of the gallbladder, then identified the proximal portion of the gallbladder.  There was a cystic duct stone right at the junction of the cystic duct, and this was kind of pushed back into the gallbladder, and then there was a small cystic duct more proximally.  The cystic artery was identified, doubly clipped proximally, singly distally, and divided.  Then a catheter was inserted in the cystic duct.  We had placed a clip across the neck of the gallbladder after the stone had been pushed back in and then the hook catheter held in place with a clip, and an x-ray was obtained.  There was good flow into the extrahepatic biliary system.  It was slightly dilated but received flow into the duodenum, and the radiologist looked at it on the monitor since we were not able to copy films because of a malfunction in the machine.  He thought it was unremarkable even though the common bile duct was slightly dilated.  The catheter was removed.  The cystic duct was triply clipped, divided, and then the posterior branch of the cystic  artery was identified, doubly clipped proximally, singly distally, divided, and then the gallbladder was removed from its bed using the hook electrocautery.  I placed the gallbladder in an EndoCatch bag, thoroughly irrigated and obtained good hemostasis with the cautery and then switched the camera to the upper 10 mm port and withdrew the gallbladder within the EndoCatch bag at the umbilicus.  I then placed a second figure-of-eight suture in the fascia at the umbilicus and then tied both.  The irrigating fluid was carefully aspirated, and there was no evidence of any bleeding, and then the carbon dioxide was released, and the subcutaneous wounds were closed with 4-0 Vicryl and Benzoin and Steri-Strips on the skin. I did place a fascia in the anterior fascia and the subxiphoid area with 0 Vicryl also.  Next, we completely changed all instruments, re-scrubbed, draped, and gowned, and then I proceeded with the left inguinal herniorrhaphy with the nurse assisting me.  The inguinal incision was made, sharp dissection down through the adipose tissue and external oblique identified.  This was opened through the external ring.  The ilioinguinal nerve was retracted and protected, and the hernia was a direct hernia sac that was separated from the cord structures.  A Penrose drain had been placed around the cord structures, and then we basically closed the floor with kind of a running 2-0 Vicryl in kind of a modified Shouldice type repair, and then a piece of Prolene mesh shaped like a sail was placed, reinforcing the floor, starting at the symphysis pubis, suturing at the inguinal ligament inferiorly with continuous 2-0 Prolene and interrupted 2-0 Prolene on the superior end flap.  The two tails were then placed around the internal ring to create a reinforced internal ring, and I took care to make this tight to prevent swelling of the testicle.  Next the external oblique was closed with a running 3-0  Vicryl. The ilioinguinal, of course, comes out at the internal ring under the superior  flap of the fascia on top of the mesh that had been placed up under this at the conjoined tendon and rectus fascia.  The Scarpas fascia was closed with a running 3-0 Vicryl.  I did anesthetize the floor in the external area to hopefully minimize the pain postoperatively.  The Scarpas was closed with interrupted 4-0 Vicryl, 4-0 Vicryl subcuticular and Benzoin and Steri-Strips on the skin incision of the hernia repair.  The patient tolerated the procedure well and was sent to the recovery room and extubated in satisfactory condition postoperative condition.  The patient will stay overnight, hopefully will not have too much discomfort.  I am planning to keep him on Kefzol for approximately 24 hours, and I did ask them to culture the bile in the gallbladder, even though we did not have any spillage of bile significantly. Dictated by:   Anselm Pancoast. Zachery Dakins, M.D. Attending Physician:  Henrene Dodge DD:  06/26/01 TD:  06/28/01 Job: [id number]

## 2010-10-26 NOTE — Cardiovascular Report (Signed)
[place]  Patient:    Eric [name], Eric [name]                       MRN: [id number] Proc. Date: 11/10/00 Adm. Date:  [id number] Attending:  Laqueta Jean CC:         Lacretia Leigh. Quintella Reichert, M.D.  Nathen May, M.D., [facility]  Sigmund I. Patsi Sears, M.D.  Cardiopulmonary Laboratory   Cardiac Catheterization  PROCEDURES PERFORMED:  Cardiac catheterization.  CLINICAL HISTORY:  The patient is 65 years old and was admitted to the hospital with epididymis.  While in the hospital, he had an abnormal liver function test and I believe was found to have gallstone and plans were made for cholecystectomy at some time in the future by Dr. Zachery Dakins.  We were asked to see him because of an abnormal ECG with fairly significant ST depression in the lateral precordial leads.  He has had no chest pain.  DESCRIPTION OF PROCEDURE:  The procedure was performed via the right femoral artery using an arterial sheath and 6 French preformed coronary catheters.  A front wall arterial puncture was performed and Omnipaque contrast was used.  T The patient tolerated the procedure well and left the laboratory in satisfactory condition.  RESULTS:  The left main coronary artery:  The left main coronary artery is free of significant disease.  Left anterior descending:  The left anterior descending artery gave rise to three diagonal branches and three septal perforators.  These and the LAD proper were free of significant disease.  Circumflex artery:  The circumflex artery gave rise to an intermediate branch, an atrial branch, a small marginal branch and two posterolateral branches. These vessels were free of significant disease.  Right coronary artery:  The right coronary was a moderately large vessel that gave rise to two right ventricular branches, the posterior descending and two posterolateral branches.  This vessel was free of significant disease.  LEFT  VENTRICULOGRAPHY:  The left ventriculogram was performed in the RAO projection showed good wall motion with no evidence of hypokinesis.  The estimated ejection fraction was 60%.  CONCLUSIONS:  Normal coronary angiography and left ventricular wall motion.  RECOMMENDATIONS:  Reassurance.  The patient does have tachycardia and we will plan to obtain a thyroid test to rule out hypothyroidism.  ADDENDUM:  The aortic pressure was 158/97 with a mean of 124, and left ventricular pressure was 158/18. DD:  11/10/00 TD:  11/10/00 Job: [id number]

## 2010-10-26 NOTE — Discharge Summary (Signed)
[facility]  Patient:    Eric [name], Eric [name]                       MRN: [id number] Adm. Date:  [id number] Disc. Date: 11/11/00 Attending:  Laqueta Jean CC:         Sigmund I. Patsi Sears, M.D.  Claretta Fraise, M.D.  Barbette Hair. Arlyce Dice, M.D. [facility]  Anselm Pancoast. Zachery Dakins, M.D.   Discharge Summary  ADDENDUM:  HISTORY OF PRESENT ILLNESS AND HOSPITAL COURSE:  Eric [name] is a 65 year old gentleman who was admitted by Dr. Patsi Sears for epididymitis and had a complicated medical course including evaluation for chest pain with cardiac catheterization and evaluation by primary care for abnormal liver functions and gallstones seen on CT scan.  General medicine consult was obtained by Dr. Patsi Sears for the above problems, and a complete evaluation, including an ultrasound of the liver, which showed no parenchymal lesions, and a laboratory evaluation for hepatitis B and C, were negative.  Further evaluation of his persistently low-grade elevated liver functions included ceruloplasmin, which was negative, and AMA, which was negative, an iron level and TIBC, which revealed iron deficiency with mild anemia.  CEA and PSA are pending at the time of his discharge.  Open issues at the time of discharge include:  Persistently elevated liver functions without known etiology.  A follow-up of the CEA and PSA will be done by Dr. Baldo Daub, who has been set up as his primary doctor, and he is referred to Dr. Melvia Heaps for evaluation of his iron deficiency anemia and probable colonoscopy, given his age and risks.  A further evaluation of his persistently elevated liver functions may include a liver biopsy at the discretion of Dr. Arlyce Dice.  His asymptomatic gallstones will be followed up by Dr. Zachery Dakins.  He was given the number of [facility] to set up a follow-up appointment for an elective cholecystectomy.  He will follow up for  general medical care and for his hypertension as well as his hyperlipidemia with a low HDL cholesterol of 20 with Dr. Baldo Daub at our [facility] office. DD:  11/11/00 TD:  11/11/00 Job: [id number]

## 2010-12-11 ENCOUNTER — Other Ambulatory Visit: Payer: Self-pay | Admitting: Internal Medicine

## 2011-01-22 ENCOUNTER — Other Ambulatory Visit: Payer: Self-pay | Admitting: Internal Medicine

## 2011-01-23 NOTE — Telephone Encounter (Signed)
Rx Done . 

## 2011-04-15 ENCOUNTER — Encounter: Payer: Self-pay | Admitting: Internal Medicine

## 2011-04-15 ENCOUNTER — Ambulatory Visit (INDEPENDENT_AMBULATORY_CARE_PROVIDER_SITE_OTHER): Payer: Medicare Other | Admitting: Internal Medicine

## 2011-04-15 DIAGNOSIS — Z0000 Encounter for general adult medical examination without abnormal findings: Secondary | ICD-10-CM

## 2011-04-15 DIAGNOSIS — Z125 Encounter for screening for malignant neoplasm of prostate: Secondary | ICD-10-CM

## 2011-04-15 DIAGNOSIS — Z23 Encounter for immunization: Secondary | ICD-10-CM

## 2011-04-15 DIAGNOSIS — E039 Hypothyroidism, unspecified: Secondary | ICD-10-CM

## 2011-04-15 DIAGNOSIS — I359 Nonrheumatic aortic valve disorder, unspecified: Secondary | ICD-10-CM

## 2011-04-15 DIAGNOSIS — I10 Essential (primary) hypertension: Secondary | ICD-10-CM

## 2011-04-15 DIAGNOSIS — E785 Hyperlipidemia, unspecified: Secondary | ICD-10-CM

## 2011-04-15 DIAGNOSIS — I350 Nonrheumatic aortic (valve) stenosis: Secondary | ICD-10-CM

## 2011-04-15 HISTORY — DX: Encounter for general adult medical examination without abnormal findings: Z00.00

## 2011-04-15 HISTORY — DX: Nonrheumatic aortic (valve) stenosis: I35.0

## 2011-04-15 LAB — COMPREHENSIVE METABOLIC PANEL
AST: 35 U/L (ref 0–37)
Alkaline Phosphatase: 62 U/L (ref 39–117)
BUN: 12 mg/dL (ref 6–23)
Calcium: 9.2 mg/dL (ref 8.4–10.5)
Creatinine, Ser: 0.9 mg/dL (ref 0.4–1.5)

## 2011-04-15 LAB — LIPID PANEL
Cholesterol: 150 mg/dL (ref 0–200)
LDL Cholesterol: 68 mg/dL (ref 0–99)
Triglycerides: 178 mg/dL — ABNORMAL HIGH (ref 0.0–149.0)
VLDL: 35.6 mg/dL (ref 0.0–40.0)

## 2011-04-15 LAB — CBC WITH DIFFERENTIAL/PLATELET
Eosinophils Relative: 1.7 % (ref 0.0–5.0)
HCT: 45.2 % (ref 39.0–52.0)
Lymphocytes Relative: 24.1 % (ref 12.0–46.0)
Lymphs Abs: 1.2 10*3/uL (ref 0.7–4.0)
Monocytes Relative: 9.5 % (ref 3.0–12.0)
Platelets: 229 10*3/uL (ref 150.0–400.0)
WBC: 5.2 10*3/uL (ref 4.5–10.5)

## 2011-04-15 LAB — PSA: PSA: 1.34 ng/mL (ref 0.10–4.00)

## 2011-04-15 LAB — TSH: TSH: 3.46 u[IU]/mL (ref 0.35–5.50)

## 2011-04-15 NOTE — Assessment & Plan Note (Signed)
mild per echo 11-2009  asx

## 2011-04-15 NOTE — Assessment & Plan Note (Signed)
Due for labs

## 2011-04-15 NOTE — Progress Notes (Signed)
  Subjective:    Patient ID: Eric [name], male    DOB: 05/25/1946, 65 y.o.   MRN: [id number]  HPI Here for Medicare AWV:  1. Risk factors based on Past M, S, F history: reviewed 2. Physical Activities: active, yard work, no regular exercise  3. Depression/mood:  No problems noted or reported 4. Hearing:  No problemss noted or reported  5. ADL's:  Independent  6. Fall Risk: low 7. home Safety: does feel safe at home  8. Height, weight, &visual acuity: see VS, uses glasses , due for a exam , encouraged to visit his eye doctor 9. Counseling: provided 10. Labs ordered based on risk factors: if needed  11. Referral Coordination: if needed 12.  Care Plan, see assessment and plan  13.   Cognitive Assessment: Motor skills and cognition  Normal  In addition, today we discussed the following: Cholesterol--good medication compliance, no apparent side effects Hypothyroidism, good medication compliance, due for labs Hypertension--good medication compliance, ambulatory blood pressures in the 130/70.   Past Medical History  Diagnosis Date  . Hypertension   . Hyperlipidemia   . Abnormal LFTs   . Hypothyroidism   . Aortic stenosis 04/15/2011   Past Surgical History  Procedure Date  . Cholecystectomy   . Cardiac catheterization 11/10/00  . Tonsillectomy   . Inguinal hernia repair     (B)    Family History:  Diabetes-- M , ?B CAD - brother had CABG in his 40s, deceased stroke-- mother colon ca--no prostate ca--no  Social History: Married 3 children, his son does mountain bike Retired 12-09 exercise-- remains active diet- -- regular  tobacco--no ETOH--no    Review of Systems  Respiratory: Negative for cough and shortness of breath.   Cardiovascular: Negative for chest pain, palpitations and leg swelling.  Gastrointestinal: Negative for abdominal pain and blood in stool.  Genitourinary: Negative for dysuria, hematuria and difficulty urinating.       Objective:   Physical  Exam  Constitutional: He is oriented to person, place, and time. He appears well-developed and well-nourished. No distress.  HENT:  Head: Normocephalic and atraumatic.  Neck: No thyromegaly present.       Normal carotid pulses  Cardiovascular: Normal rate, regular rhythm and normal heart sounds.        Trace systolic murmur  Pulmonary/Chest: Effort normal and breath sounds normal. No respiratory distress. He has no wheezes. He has no rales.  Abdominal: Soft. He exhibits no distension. There is no tenderness. There is no rebound and no guarding.  Genitourinary: Rectum normal and prostate normal.  Musculoskeletal: He exhibits no edema.  Neurological: He is alert and oriented to person, place, and time.  Skin: He is not diaphoretic.  Psychiatric: He has a normal mood and affect. His behavior is normal. Thought content normal.       Assessment & Plan:

## 2011-04-15 NOTE — Assessment & Plan Note (Signed)
+  FH CAD, LDL  Goal < 100, labs

## 2011-04-15 NOTE — Assessment & Plan Note (Signed)
Td 2002 got a  flu shot at work per  patient: Cscope x 2, last  04-2006, next 2014 (Dr. Arlyce Dice) diet and exercise discussed  labs

## 2011-04-15 NOTE — Assessment & Plan Note (Signed)
At goal, labs  

## 2011-04-16 ENCOUNTER — Encounter: Payer: Self-pay | Admitting: Internal Medicine

## 2011-07-09 ENCOUNTER — Other Ambulatory Visit: Payer: Self-pay | Admitting: Internal Medicine

## 2011-07-10 NOTE — Telephone Encounter (Signed)
Refill done.  

## 2011-09-05 ENCOUNTER — Other Ambulatory Visit: Payer: Self-pay | Admitting: Internal Medicine

## 2011-09-05 NOTE — Telephone Encounter (Signed)
Refill done.  

## 2011-10-14 ENCOUNTER — Encounter: Payer: Self-pay | Admitting: Internal Medicine

## 2011-10-14 ENCOUNTER — Ambulatory Visit (INDEPENDENT_AMBULATORY_CARE_PROVIDER_SITE_OTHER): Payer: Medicare Other | Admitting: Internal Medicine

## 2011-10-14 VITALS — BP 134/80 | HR 61 | Temp 97.9°F | Wt 192.0 lb

## 2011-10-14 DIAGNOSIS — E785 Hyperlipidemia, unspecified: Secondary | ICD-10-CM

## 2011-10-14 DIAGNOSIS — I10 Essential (primary) hypertension: Secondary | ICD-10-CM

## 2011-10-14 DIAGNOSIS — E039 Hypothyroidism, unspecified: Secondary | ICD-10-CM

## 2011-10-14 LAB — TSH: TSH: 3.21 u[IU]/mL (ref 0.35–5.50)

## 2011-10-14 NOTE — Assessment & Plan Note (Signed)
Good control, no change. 

## 2011-10-14 NOTE — Assessment & Plan Note (Signed)
Good medication compliance, labs 

## 2011-10-14 NOTE — Assessment & Plan Note (Signed)
Good medication compliance, excellent control. Previous cholesterol panel discussed with the patient. No change

## 2011-10-14 NOTE — Progress Notes (Signed)
  Subjective:    Patient ID: Russella Dar, male    DOB: 03/19/1946, 66 y.o.   MRN: [id number]  HPI Routine office visit Good compliance with medications, no apparent side effects, ambulatory BPs around 124/78. He remains physically active.  Past Medical History  Diagnosis Date  . Hypertension   . Hyperlipidemia   . Abnormal LFTs   . Hypothyroidism   . Aortic stenosis 04/15/2011     Review of Systems No chest pain or shortness of breath. No lower extremity edema. No nausea, vomiting, diarrhea.    Objective:   Physical Exam  General -- alert, well-developed, and not overweight appearing. No apparent distress.  lungs -- normal respiratory effort, no intercostal retractions, no accessory muscle use, and normal breath sounds.   Heart-- normal rate, regular rhythm, no murmur, and no gallop.   Extremities-- no pretibial edema bilaterally  Neurologic-- alert & oriented X3 and strength normal in all extremities. Psych-- Cognition and judgment appear intact. Alert and cooperative with normal attention span and concentration.  not anxious appearing and not depressed appearing.       Assessment & Plan:

## 2011-10-15 ENCOUNTER — Encounter: Payer: Self-pay | Admitting: *Deleted

## 2012-02-03 ENCOUNTER — Other Ambulatory Visit: Payer: Self-pay | Admitting: Internal Medicine

## 2012-02-03 NOTE — Telephone Encounter (Signed)
Refill done.  

## 2012-04-15 ENCOUNTER — Encounter: Payer: Self-pay | Admitting: Internal Medicine

## 2012-04-15 ENCOUNTER — Ambulatory Visit (INDEPENDENT_AMBULATORY_CARE_PROVIDER_SITE_OTHER): Payer: Medicare Other | Admitting: Internal Medicine

## 2012-04-15 VITALS — BP 142/86 | HR 70 | Temp 98.0°F | Ht 68.0 in | Wt 196.0 lb

## 2012-04-15 DIAGNOSIS — Z23 Encounter for immunization: Secondary | ICD-10-CM

## 2012-04-15 DIAGNOSIS — I10 Essential (primary) hypertension: Secondary | ICD-10-CM

## 2012-04-15 DIAGNOSIS — I359 Nonrheumatic aortic valve disorder, unspecified: Secondary | ICD-10-CM

## 2012-04-15 DIAGNOSIS — E785 Hyperlipidemia, unspecified: Secondary | ICD-10-CM

## 2012-04-15 DIAGNOSIS — Z0000 Encounter for general adult medical examination without abnormal findings: Secondary | ICD-10-CM

## 2012-04-15 DIAGNOSIS — I350 Nonrheumatic aortic (valve) stenosis: Secondary | ICD-10-CM

## 2012-04-15 DIAGNOSIS — E039 Hypothyroidism, unspecified: Secondary | ICD-10-CM

## 2012-04-15 LAB — ALT: ALT: 28 U/L (ref 0–53)

## 2012-04-15 LAB — TSH: TSH: 1.88 u[IU]/mL (ref 0.35–5.50)

## 2012-04-15 LAB — BASIC METABOLIC PANEL
Calcium: 9.2 mg/dL (ref 8.4–10.5)
GFR: 80.33 mL/min (ref >60.00)
Glucose, Bld: 104 mg/dL — ABNORMAL HIGH (ref 70–99)
Sodium: 141 mEq/L (ref 135–145)

## 2012-04-15 LAB — LIPID PANEL
Cholesterol: 143 mg/dL (ref 0–200)
HDL: 40.9 mg/dL (ref >39.00)

## 2012-04-15 LAB — AST: AST: 32 U/L (ref 0–37)

## 2012-04-15 MED ORDER — ZOSTER VACCINE LIVE 19400 UNT/0.65ML ~~LOC~~ SOLR: 0.6500 mL | Freq: Once | SUBCUTANEOUS | Status: DC

## 2012-04-15 NOTE — Assessment & Plan Note (Addendum)
Td 2012 got a  flu shot already Pneumonia shot zostavax-- Rx provided, benefits discussed  per  patient: Cscope x 2, last  04-2006, next 2014 (Dr. Arlyce Dice) diet and exercise discussed  labs  Has a family history of heart disease, currently asymptomatic, on aspirin, plan is to continue controlling his CV RF

## 2012-04-15 NOTE — Assessment & Plan Note (Addendum)
BP slightly elevated today but not ambulatory BPs. No change, labs , EKG (NSR, no acute changes)

## 2012-04-15 NOTE — Patient Instructions (Addendum)

## 2012-04-15 NOTE — Assessment & Plan Note (Signed)
Good compliance with medication, labs 

## 2012-04-15 NOTE — Assessment & Plan Note (Signed)
History of aortic stenosis per echocardiogram 2011, he does have a murmur, currently asymptomatic

## 2012-04-15 NOTE — Assessment & Plan Note (Signed)
good medication compliance Labs  

## 2012-04-15 NOTE — Progress Notes (Signed)
Subjective:    Patient ID: Eric [name], male    DOB: 1945/09/25, 66 y.o.   MRN: [id number]  HPI Here for Medicare AWV: 1. Risk factors based on Past M, S, F history: reviewed 2. Physical Activities: active, yard work, occ walk  3. Depression/mood:  No problemss noted or reported  4. Hearing:  No problemss noted or reported  5. ADL's:  Independent  6. Fall Risk: low risk, see instructions  7. home Safety: does feelsafe at home  8. Height, weight, &visual acuity: see VS, due for an eye check, plans to schedule one 9. Counseling: provided 10. Labs ordered based on risk factors: if needed  11. Referral Coordination: if needed 12.  Care Plan, see assessment and plan  13.   Cognitive Assessment: motor & cognition intact  In addition, today we discussed the following: Hypertension, good medication compliance, BP today slightly elevated but ambulatory blood pressures within normal. Hypothyroidism, good medication compliance High cholesterol, on Crestor, good compliance, no apparent side effects.  Past Medical History  Diagnosis Date  . Hypertension   . Hyperlipidemia   . Abnormal LFTs   . Hypothyroidism   . Aortic stenosis 04/15/2011   Past Surgical History  Procedure Date  . Cholecystectomy   . Cardiac catheterization 11/10/00  . Tonsillectomy   . Inguinal hernia repair     (B)   History   Social History  . Marital Status: Married    Spouse Name: N/A    Number of Children: 3  . Years of Education: N/A   Occupational History  . retired [employer]    05/2008   Social History Main Topics  . Smoking status: Former Smoker    Quit date: 04/15/1990  . Smokeless tobacco: Never Used     Comment: used to smoke 2 ppd  . Alcohol Use: No     Comment: no ETOH since the 90s  . Drug Use: No  . Sexually Active: Not on file   Other Topics Concern  . Not on file   Social History Narrative   Exercise:remains active ------Diet: trying to improve  ----3 children, one of  them does mountain biking   Family History  Problem Relation Age of Onset  . Diabetes Mother   . Diabetes Brother     ?  . Coronary artery disease Brother     Male 1st degree relative >50, had CABG in his 35s  . Stroke Mother   . Colon cancer Neg Hx   . Prostate cancer Neg Hx      Review of Systems No chest pain or shortness of breath. Does not get dizzy or weak and when he exercises. No nausea, vomiting, diarrhea. No dysuria gross what.     Objective:   Physical Exam General -- alert, well-developed, and well-nourished.   Neck --no thyromegaly , normal carotid pulse Lungs -- normal respiratory effort, no intercostal retractions, no accessory muscle use, and normal breath sounds.   Heart-- normal rate, regular rhythm,+ syst  murmur, and no gallop.   Abdomen--soft, non-tender, no distention, no masses, no HSM, no guarding, and no rigidity. No bruit.   Extremities-- no pretibial edema bilaterally Rectal-- No external abnormalities noted. Normal sphincter tone. No rectal masses or tenderness. Brown stool  Prostate:  Prostate gland firm and smooth, no enlargement, nodularity, tenderness, mass, asymmetry or induration. Neurologic-- alert & oriented X3 and strength normal in all extremities. Psych-- Cognition and judgment appear intact. Alert and cooperative with normal attention span and  concentration.  not anxious appearing and not depressed appearing.       Assessment & Plan:

## 2012-04-16 ENCOUNTER — Encounter: Payer: Self-pay | Admitting: Internal Medicine

## 2012-06-05 ENCOUNTER — Other Ambulatory Visit: Payer: Self-pay | Admitting: Internal Medicine

## 2012-06-08 NOTE — Telephone Encounter (Signed)
Refill done.  

## 2013-03-24 ENCOUNTER — Encounter: Payer: Self-pay | Admitting: Gastroenterology

## 2013-04-15 ENCOUNTER — Other Ambulatory Visit: Payer: Self-pay

## 2013-04-16 ENCOUNTER — Telehealth: Payer: Self-pay

## 2013-04-16 NOTE — Telephone Encounter (Signed)
Medication List and allergies: updated and reviewed  90 day supply/mail order: na Local prescriptions: [facility] [address]  Immunizations due: shingles (will get after first of year with new insurance)  A/P:   No changes to FH or PSH Flu vaccine at work Tdap--04/2011 Pneumonia--04/2012 [facility]--11/07--Dr Kaplan--due--will schedule after first of year with new insurance PSA--04/2011--1.34  To Discuss with Provider: Dr Allyne Gee at [facility] should have e-mail to pcp

## 2013-04-19 ENCOUNTER — Encounter: Payer: Self-pay | Admitting: Internal Medicine

## 2013-04-19 ENCOUNTER — Ambulatory Visit (INDEPENDENT_AMBULATORY_CARE_PROVIDER_SITE_OTHER): Payer: Medicare Other | Admitting: Internal Medicine

## 2013-04-19 VITALS — BP 138/80 | HR 61 | Temp 98.0°F | Ht 68.0 in | Wt 197.2 lb

## 2013-04-19 DIAGNOSIS — E039 Hypothyroidism, unspecified: Secondary | ICD-10-CM

## 2013-04-19 DIAGNOSIS — Z125 Encounter for screening for malignant neoplasm of prostate: Secondary | ICD-10-CM

## 2013-04-19 DIAGNOSIS — Z0000 Encounter for general adult medical examination without abnormal findings: Secondary | ICD-10-CM

## 2013-04-19 DIAGNOSIS — E785 Hyperlipidemia, unspecified: Secondary | ICD-10-CM

## 2013-04-19 DIAGNOSIS — I10 Essential (primary) hypertension: Secondary | ICD-10-CM

## 2013-04-19 LAB — TSH: TSH: 2.18 u[IU]/mL (ref 0.35–5.50)

## 2013-04-19 LAB — CBC WITH DIFFERENTIAL/PLATELET
Eosinophils Relative: 1 % (ref 0.0–5.0)
HCT: 44.8 % (ref 39.0–52.0)
Hemoglobin: 15.6 g/dL (ref 13.0–17.0)
Lymphocytes Relative: 27.8 % (ref 12.0–46.0)
Lymphs Abs: 1.3 10*3/uL (ref 0.7–4.0)
Monocytes Relative: 10.2 % (ref 3.0–12.0)
Neutro Abs: 2.7 10*3/uL (ref 1.4–7.7)
RBC: 4.96 Mil/uL (ref 4.22–5.81)
WBC: 4.5 10*3/uL (ref 4.5–10.5)

## 2013-04-19 LAB — COMPREHENSIVE METABOLIC PANEL
BUN: 10 mg/dL (ref 6–23)
CO2: 27 mEq/L (ref 19–32)
Calcium: 9.4 mg/dL (ref 8.4–10.5)
Creatinine, Ser: 0.9 mg/dL (ref 0.4–1.5)
GFR: 86.07 mL/min (ref >60.00)
Glucose, Bld: 113 mg/dL — ABNORMAL HIGH (ref 70–99)
Total Bilirubin: 0.9 mg/dL (ref 0.3–1.2)

## 2013-04-19 LAB — LIPID PANEL
Cholesterol: 146 mg/dL (ref 0–200)
LDL Cholesterol: 80 mg/dL (ref 0–99)
Triglycerides: 94 mg/dL (ref 0.0–149.0)
VLDL: 18.8 mg/dL (ref 0.0–40.0)

## 2013-04-19 MED ORDER — LEVOTHYROXINE SODIUM 75 MCG PO TABS: ORAL_TABLET | ORAL | Status: DC

## 2013-04-19 MED ORDER — METOPROLOL TARTRATE 50 MG PO TABS: ORAL_TABLET | ORAL | Status: DC

## 2013-04-19 MED ORDER — ROSUVASTATIN CALCIUM 20 MG PO TABS: ORAL_TABLET | ORAL | Status: DC

## 2013-04-19 MED ORDER — NIFEDIPINE ER OSMOTIC RELEASE 30 MG PO TB24: ORAL_TABLET | ORAL | Status: DC

## 2013-04-19 NOTE — Assessment & Plan Note (Addendum)
Td 2012 Had a flu shot   Pneumonia shot 2013 zostavax-- plans to do it next year per  patient: Cscope x 2, last  04-2006, next 2014 (Dr. Myles Lipps plans to schedule after first of year with new insurance DRE (-) today, check a PSA diet and exercise discussed  labs  + FH CAD,currently asymptomatic, on aspirin, plan is to continue controlling his CV RF

## 2013-04-19 NOTE — Patient Instructions (Addendum)
Get your blood work before you leave  Next visit in 1 year for a physical exam  . Fasting Please make an appointment    Check the  blood pressure 2 or 3 times a month   be sure it is between 110/60 and 140/85. Ideal blood pressure is 120/80. If it is consistently higher or lower, let me know    Fall Prevention and Home Safety Falls cause injuries and can affect all age groups. It is possible to use preventive measures to significantly decrease the likelihood of falls. There are many simple measures which can make your home safer and prevent falls. OUTDOORS  Repair cracks and edges of walkways and driveways.  Remove high doorway thresholds.  Trim shrubbery on the main path into your home.  Have good outside lighting.  Clear walkways of tools, rocks, debris, and clutter.  Check that handrails are not broken and are securely fastened. Both sides of steps should have handrails.  Have leaves, snow, and ice cleared regularly.  Use sand or salt on walkways during winter months.  In the garage, clean up grease or oil spills. BATHROOM  Install night lights.  Install grab bars by the toilet and in the tub and shower.  Use non-skid mats or decals in the tub or shower.  Place a plastic non-slip stool in the shower to sit on, if needed.  Keep floors dry and clean up all water on the floor immediately.  Remove soap buildup in the tub or shower on a regular basis.  Secure bath mats with non-slip, double-sided rug tape.  Remove throw rugs and tripping hazards from the floors. BEDROOMS  Install night lights.  Make sure a bedside light is easy to reach.  Do not use oversized bedding.  Keep a telephone by your bedside.  Have a firm chair with side arms to use for getting dressed.  Remove throw rugs and tripping hazards from the floor. KITCHEN  Keep handles on pots and pans turned toward the center of the stove. Use back burners when possible.  Clean up spills quickly and  allow time for drying.  Avoid walking on wet floors.  Avoid hot utensils and knives.  Position shelves so they are not too high or low.  Place commonly used objects within easy reach.  If necessary, use a sturdy step stool with a grab bar when reaching.  Keep electrical cables out of the way.  Do not use floor polish or wax that makes floors slippery. If you must use wax, use non-skid floor wax.  Remove throw rugs and tripping hazards from the floor. STAIRWAYS  Never leave objects on stairs.  Place handrails on both sides of stairways and use them. Fix any loose handrails. Make sure handrails on both sides of the stairways are as long as the stairs.  Check carpeting to make sure it is firmly attached along stairs. Make repairs to worn or loose carpet promptly.  Avoid placing throw rugs at the top or bottom of stairways, or properly secure the rug with carpet tape to prevent slippage. Get rid of throw rugs, if possible.  Have an electrician put in a light switch at the top and bottom of the stairs. OTHER FALL PREVENTION TIPS  Wear low-heel or rubber-soled shoes that are supportive and fit well. Wear closed toe shoes.  When using a stepladder, make sure it is fully opened and both spreaders are firmly locked. Do not climb a closed stepladder.  Add color or contrast paint   or tape to grab bars and handrails in your home. Place contrasting color strips on first and last steps.  Learn and use mobility aids as needed. Install an electrical emergency response system.  Turn on lights to avoid dark areas. Replace light bulbs that burn out immediately. Get light switches that glow.  Arrange furniture to create clear pathways. Keep furniture in the same place.  Firmly attach carpet with non-skid or double-sided tape.  Eliminate uneven floor surfaces.  Select a carpet pattern that does not visually hide the edge of steps.  Be aware of all pets. OTHER HOME SAFETY TIPS  Set the  water temperature for 120 F (48.8 C).  Keep emergency numbers on or near the telephone.  Keep smoke detectors on every level of the home and near sleeping areas. Document Released: 05/17/2002 Document Revised: 11/26/2011 Document Reviewed: 08/16/2011 [employer] Patient Information 2014 [employer].    

## 2013-04-19 NOTE — Progress Notes (Signed)
Subjective:    Patient ID: Eric [name], male    DOB: [birth year], 67 y.o.   MRN: [id number]  HPI  Here for Medicare AWV: 1. Risk factors based on Past M, S, F history: reviewed 2. Physical Activities: active, yard work, occ walk   3. Depression/mood:  Neg screen  4. Hearing:  No problemss noted or reported   5. ADL's:  Independent   6. Fall Risk: low risk, see instructions   7. home Safety: does feelsafe at home   8. Height, weight, &visual acuity: see VS, sees Dr Allyne Gee, dx w/ mac degeneration  9. Counseling: provided 10. Labs ordered based on risk factors: if needed   11. Referral Coordination: if needed 12.  Care Plan, see assessment and plan   13.   Cognitive Assessment: motor & cognition intact  In addition, today we discussed the following: Hypertension--good medication compliance, ambulatory BPs 130/80 High cholesterol-- on Crestor, no apparent side effects.  Past Medical History  Diagnosis Date  . Hypertension   . Hyperlipidemia   . Abnormal LFTs   . Hypothyroidism   . Aortic stenosis 04/15/2011  . Macular degeneration     f/u w/ Dr Allyne Gee   Past Surgical History  Procedure Laterality Date  . Cholecystectomy    . Cardiac catheterization  11/10/00  . Tonsillectomy    . Inguinal hernia repair      (B)   History   Social History  . Marital Status: Married    Spouse Name: N/A    Number of Children: 3  . Years of Education: N/A   Occupational History  . retired [employer]    05/2008   Social History Main Topics  . Smoking status: Former Smoker    Quit date: 04/15/1990  . Smokeless tobacco: Never Used     Comment: used to smoke 2 ppd  . Alcohol Use: No     Comment: no ETOH since the 90s  . Drug Use: No  . Sexual Activity: Not on file   Other Topics Concern  . Not on file   Social History Narrative   3 children, 7 g-kids                Family History  Problem Relation Age of Onset  . Diabetes Mother   . Diabetes Brother     ?   . Coronary artery disease Brother     Male 1st degree relative >50, had CABG in his 15s  . Stroke Mother   . Colon cancer Neg Hx   . Prostate cancer Neg Hx     Review of Systems  No  CP, SOB, lower est edema Denies  nausea, vomiting diarrhea Denies  blood in the stools No dysuria, gross hematuria, difficulty urinating         Objective:   Physical Exam BP 138/80  Pulse 61  Temp(Src) 98 F (36.7 C)  Ht 5\' 8"  (1.727 m)  Wt 197 lb 3.2 oz (89.449 kg)  BMI 29.99 kg/m2  SpO2 97% General -- alert, well-developed, NAD.  Neck --no thyromegaly , normal carotid pulse Lungs -- normal respiratory effort, no intercostal retractions, no accessory muscle use, and normal breath sounds.  Heart-- normal rate, regular rhythm, trace murmur.  Abdomen-- Not distended, good bowel sounds,soft, non-tender. No rebound or rigidity. No mass,organomegaly. Rectal-- No external abnormalities noted. Normal sphincter tone. No rectal masses or tenderness. Brown stool, Hemoccult negative  Prostate--Prostate gland firm and smooth, no enlargement, nodularity, tenderness, mass, asymmetry  or induration. Extremities-- no pretibial edema bilaterally  Neurologic--  alert & oriented X3. Speech normal, gait normal, strength normal in all extremities.  Psych-- Cognition and judgment appear intact. Cooperative with normal attention span and concentration. No anxious appearing, no depressed appearing.     Assessment & Plan:

## 2013-04-19 NOTE — Progress Notes (Signed)
Pre visit review using our clinic review tool, if applicable. No additional management support is needed unless otherwise documented below in the visit note. 

## 2013-04-19 NOTE — Assessment & Plan Note (Signed)
Labs

## 2013-04-19 NOTE — Assessment & Plan Note (Signed)
On crestor, doing well, labs

## 2013-04-19 NOTE — Assessment & Plan Note (Signed)
well controlled , no change, labs

## 2013-04-23 ENCOUNTER — Ambulatory Visit: Payer: 59

## 2013-04-23 DIAGNOSIS — R7309 Other abnormal glucose: Secondary | ICD-10-CM

## 2013-04-23 LAB — HEMOGLOBIN A1C: Hgb A1c MFr Bld: 5.5 % (ref 4.6–6.5)

## 2013-04-27 ENCOUNTER — Telehealth: Payer: Self-pay | Admitting: Gastroenterology

## 2013-04-27 NOTE — Telephone Encounter (Signed)
The recommendations are colonoscopy followup 5-10 years after last negative colonoscopy.  He certainly can wait to 2017 or choose to do it earlier

## 2013-04-27 NOTE — Telephone Encounter (Signed)
Patient aware.

## 2013-04-27 NOTE — Telephone Encounter (Signed)
Pts last colon done in 2007, there were no polyps found. Pt had colon in 2002 and there was an adenomatous polyp. Pt received a recall letter but he was under the impression that he did not need one until 2017. Please advise.

## 2013-06-02 ENCOUNTER — Other Ambulatory Visit: Payer: Self-pay | Admitting: Internal Medicine

## 2014-03-10 ENCOUNTER — Telehealth: Payer: Self-pay | Admitting: *Deleted

## 2014-03-10 NOTE — Telephone Encounter (Signed)
Please advise 

## 2014-03-10 NOTE — Telephone Encounter (Signed)
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  Please state that he is physically OK to drive.   He is supposed to drive 46/10/352 and other times as activities are scheduled, but needs before to be cleared through the church office.  Best number to reach pt, [phone].

## 2014-03-11 NOTE — Telephone Encounter (Signed)
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

## 2014-03-11 NOTE — Telephone Encounter (Signed)
Spoke with Pt, informed him letter was ready. He requested I mail letter to his home address. Letter mailed to Pt as requested.

## 2014-04-21 ENCOUNTER — Encounter: Payer: Self-pay | Admitting: Internal Medicine

## 2014-04-21 ENCOUNTER — Ambulatory Visit (INDEPENDENT_AMBULATORY_CARE_PROVIDER_SITE_OTHER): Payer: 59 | Admitting: Internal Medicine

## 2014-04-21 VITALS — BP 145/83 | HR 66 | Temp 98.0°F | Ht 68.0 in | Wt 196.1 lb

## 2014-04-21 DIAGNOSIS — E785 Hyperlipidemia, unspecified: Secondary | ICD-10-CM

## 2014-04-21 DIAGNOSIS — Z23 Encounter for immunization: Secondary | ICD-10-CM

## 2014-04-21 DIAGNOSIS — I350 Nonrheumatic aortic (valve) stenosis: Secondary | ICD-10-CM

## 2014-04-21 DIAGNOSIS — Z0000 Encounter for general adult medical examination without abnormal findings: Secondary | ICD-10-CM

## 2014-04-21 DIAGNOSIS — E038 Other specified hypothyroidism: Secondary | ICD-10-CM

## 2014-04-21 DIAGNOSIS — R42 Dizziness and giddiness: Secondary | ICD-10-CM

## 2014-04-21 DIAGNOSIS — I10 Essential (primary) hypertension: Secondary | ICD-10-CM

## 2014-04-21 HISTORY — DX: Dizziness and giddiness: R42

## 2014-04-21 LAB — CBC WITH DIFFERENTIAL/PLATELET
BASOS ABS: 0 10*3/uL (ref 0.0–0.1)
Basophils Relative: 0.3 % (ref 0.0–3.0)
EOS ABS: 0 10*3/uL (ref 0.0–0.7)
Eosinophils Relative: 0.7 % (ref 0.0–5.0)
HCT: 45.9 % (ref 39.0–52.0)
Hemoglobin: 15.4 g/dL (ref 13.0–17.0)
Lymphocytes Relative: 25.1 % (ref 12.0–46.0)
Lymphs Abs: 1.3 10*3/uL (ref 0.7–4.0)
MCHC: 33.6 g/dL (ref 30.0–36.0)
MCV: 92.6 fl (ref 78.0–100.0)
Monocytes Absolute: 0.6 10*3/uL (ref 0.1–1.0)
Monocytes Relative: 10.9 % (ref 3.0–12.0)
Neutro Abs: 3.3 10*3/uL (ref 1.4–7.7)
Neutrophils Relative %: 63 % (ref 43.0–77.0)
Platelets: 237 10*3/uL (ref 150.0–400.0)
RBC: 4.96 Mil/uL (ref 4.22–5.81)
RDW: 13.1 % (ref 11.5–15.5)
WBC: 5.2 10*3/uL (ref 4.0–10.5)

## 2014-04-21 LAB — COMPREHENSIVE METABOLIC PANEL
ALT: 42 U/L (ref 0–53)
AST: 44 U/L — AB (ref 0–37)
Albumin: 3.9 g/dL (ref 3.5–5.2)
Alkaline Phosphatase: 66 U/L (ref 39–117)
BILIRUBIN TOTAL: 1.4 mg/dL — AB (ref 0.2–1.2)
BUN: 12 mg/dL (ref 6–23)
CHLORIDE: 106 meq/L (ref 96–112)
CO2: 20 mEq/L (ref 19–32)
CREATININE: 1.1 mg/dL (ref 0.4–1.5)
Calcium: 9.5 mg/dL (ref 8.4–10.5)
GFR: 71.45 mL/min (ref >60.00)
Glucose, Bld: 115 mg/dL — ABNORMAL HIGH (ref 70–99)
Potassium: 3.7 mEq/L (ref 3.5–5.1)
Sodium: 140 mEq/L (ref 135–145)
Total Protein: 7.7 g/dL (ref 6.0–8.3)

## 2014-04-21 LAB — LIPID PANEL
CHOL/HDL RATIO: 4
Cholesterol: 144 mg/dL (ref 0–200)
HDL: 36.9 mg/dL — AB (ref >39.00)
LDL Cholesterol: 79 mg/dL (ref 0–99)
NonHDL: 107.1
TRIGLYCERIDES: 141 mg/dL (ref 0.0–149.0)
VLDL: 28.2 mg/dL (ref 0.0–40.0)

## 2014-04-21 LAB — TSH: TSH: 2.14 u[IU]/mL (ref 0.35–4.50)

## 2014-04-21 MED ORDER — NIFEDIPINE ER OSMOTIC RELEASE 30 MG PO TB24: ORAL_TABLET | ORAL | Status: DC

## 2014-04-21 MED ORDER — LEVOTHYROXINE SODIUM 75 MCG PO TABS: ORAL_TABLET | ORAL | Status: DC

## 2014-04-21 MED ORDER — ROSUVASTATIN CALCIUM 20 MG PO TABS: ORAL_TABLET | ORAL | Status: DC

## 2014-04-21 MED ORDER — METOPROLOL TARTRATE 50 MG PO TABS: ORAL_TABLET | ORAL | Status: DC

## 2014-04-21 NOTE — Assessment & Plan Note (Signed)
Good compliance of medications, check a TSH

## 2014-04-21 NOTE — Progress Notes (Signed)
Subjective:    Patient ID: Eric [name], male    DOB: 1945/11/26, 68 y.o.   MRN: [id number]  DOS:  04/21/2014 Type of visit - description :   Here for Medicare AWV: 1. Risk factors based on Past M, S, F history: reviewed 2. Physical Activities: active, yard work, occ walk   3. Depression/mood:  Neg screen  4. Hearing:  No problemss noted or reported   5. ADL's:  Independent   6. Fall Risk: low risk, see instructions   7. home Safety: does feelsafe at home   8. Height, weight, &visual acuity: see VS, sees Dr Baird Cancer, dx w/ mac degeneration, vision stable, on injections 9. Counseling: provided 10. Labs ordered based on risk factors: if needed   11. Referral Coordination: if needed 12.  Care Plan, see assessment and plan   13.   Cognitive Assessment: motor & cognition intact and appropriate for age   In addition, today we discussed the following: Hyperlipidemia, good compliance with Crestor Hypertension, good compliance with medications, he check his blood pressure from time to time and is always within normal, in the 130/70 range. Thyroid, good compliance with Synthroid 2 weeks ago for one day he felt "off balance", that day when he got up and tried to walk he felt slightly dizzy, symptoms lasted a few seconds and then he was able to walk without problems. Denies headaches, double vision, slurred speech, motor deficits. Symptoms resolved   ROS No  CP, SOB Denies  nausea, vomiting diarrhea, blood in the stools (-) cough, sputum production (-) wheezing, chest congestion No dysuria, gross hematuria, difficulty urinating     Past Medical History  Diagnosis Date  . Hypertension   . Hyperlipidemia   . Abnormal LFTs   . Hypothyroidism   . Aortic stenosis 04/15/2011  . Macular degeneration     f/u w/ Dr Baird Cancer    Past Surgical History  Procedure Laterality Date  . Cholecystectomy    . Cardiac catheterization  11/10/00  . Tonsillectomy    . Inguinal hernia repair      (B)      History   Social History  . Marital Status: Married    Spouse Name: N/A    Number of Children: 3  . Years of Education: N/A   Occupational History  . retired     05/2008   Social History Main Topics  . Smoking status: Former Smoker    Quit date: 04/15/1990  . Smokeless tobacco: Never Used     Comment: used to smoke 2 ppd  . Alcohol Use: No     Comment: no ETOH since the 90s  . Drug Use: No  . Sexual Activity: Not on file   Other Topics Concern  . Not on file   Social History Narrative   3 children, 7 g-kids                  Family History  Problem Relation Age of Onset  . Diabetes Mother   . Diabetes Brother     ?  . Coronary artery disease Brother     Male 1st degree relative >50, had CABG in his 10s  . Stroke Mother   . Colon cancer Neg Hx   . Prostate cancer Neg Hx       Medication List       This list is accurate as of: 04/21/14  2:41 PM.  Always use your most recent med list.  aspirin 81 MG tablet  Take 81 mg by mouth daily.     levothyroxine 75 MCG tablet  Commonly known as:  SYNTHROID, LEVOTHROID  TAKE 1 TABLET BY MOUTH ONCE DAILY     metoprolol 50 MG tablet  Commonly known as:  LOPRESSOR  TAKE 1 TABLET BY MOUTH TWICE A DAY     NIFEdipine 30 MG 24 hr tablet  Commonly known as:  PROCARDIA-XL/ADALAT-CC/NIFEDICAL-XL  TAKE 1 TABLET BY MOUTH EVERY DAY     rosuvastatin 20 MG tablet  Commonly known as:  CRESTOR  TAKE 1 TABLET BY MOUTH EVERY DAY           Objective:   Physical Exam BP 145/83 mmHg  Pulse 66  Temp(Src) 98 F (36.7 C) (Oral)  Ht 5\' 8"  (1.727 m)  Wt 196 lb 2 oz (88.962 kg)  BMI 29.83 kg/m2  SpO2 94%  General -- alert, well-developed, NAD.  Neck --no thyromegaly , normal carotid pulse, auscultation of  the carotids with a murmur, I think radiated  from the heart. HEENT-- Not pale.   Lungs -- normal respiratory effort, no intercostal retractions, no accessory muscle use, and normal breath sounds.   Heart-- normal rate, regular rhythm, + holosystolic soft murmur , slightly more pronounced than last year?Marland Kitchen  Abdomen-- Not distended, good bowel sounds,soft, non-tender. Extremities-- no pretibial edema bilaterally  Neurologic--  alert & oriented X3. Speech normal, gait appropriate for age, strength symmetric and appropriate for age.  DTRs symmetric. EOMI, Romberg negative Psych-- Cognition and judgment appear intact. Cooperative with normal attention span and concentration. No anxious or depressed appearing.     Assessment & Plan:

## 2014-04-21 NOTE — Assessment & Plan Note (Signed)
Continue metoprolol and extended release nifedipine, labs. Reports good ambulatory BPs

## 2014-04-21 NOTE — Assessment & Plan Note (Signed)
Td 2012 Had a flu shot  Pneumonia shot 2013 prevnar-- today zostavax--recommended , plans to get at the pharmacy  per patient: Cscope x 2, last 04-2006, next due 2014 (Dr. 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

## 2014-04-21 NOTE — Assessment & Plan Note (Signed)
Good compliance with Crestor, check a FLP

## 2014-04-21 NOTE — Assessment & Plan Note (Signed)
Dizziness, Episode of dizziness as described above, likely peripheral issue. He has a history of a aortic stenosis, murmurs seems to be slightly more pronounced today. He has a bruit and a carotid arteries likely related to aortic stenosis. Plan: Echo, carotid ultrasound. Otherwise observation

## 2014-04-21 NOTE — Assessment & Plan Note (Signed)
See comments under dizziness, will check an EKG as well

## 2014-04-21 NOTE — Patient Instructions (Signed)
Get your blood work before you leave    Please come back to the office in 6 months for a routine check up       Preventive Care for Adults Ages 49 and over  Blood pressure check.** / Every 1 to 2 years.  Lipid and cholesterol check.**/ Every 5 years beginning at age 68.  Lung cancer screening. / Every year if you are aged 12-80 years and have a 30-pack-year history of smoking and currently smoke or have quit within the past 15 years. Yearly screening is stopped once you have quit smoking for at least 15 years or develop a health problem that would prevent you from having lung cancer treatment.  Fecal occult blood test (FOBT) of stool. / Every year beginning at age 75 and continuing until age 46. You may not have to do this test if you get a colonoscopy every 10 years.  Flexible sigmoidoscopy** or colonoscopy.** / Every 5 years for a flexible sigmoidoscopy or every 10 years for a colonoscopy beginning at age 47 and continuing until age 41.  Hepatitis C blood test.** / For all people born from [birth year] through 1965 and any individual with known risks for hepatitis C.  Abdominal aortic aneurysm (AAA) screening.** / A one-time screening for ages 5 to 70 years who are current or former smokers.  Skin self-exam. / Monthly.  Influenza vaccine. / Every year.  Tetanus, diphtheria, and acellular pertussis (Tdap/Td) vaccine.** / 1 dose of Td every 10 years.  Varicella vaccine.** / Consult your health care provider.  Zoster vaccine.** / 1 dose for adults aged 10 years or older.  Pneumococcal 13-valent conjugate (PCV13) vaccine.** / Consult your health care provider.  Pneumococcal polysaccharide (PPSV23) vaccine.** / 1 dose for all adults aged 65 years and older.  Meningococcal vaccine.** / Consult your health care provider.  Hepatitis A vaccine.** / Consult your health care provider.  Hepatitis B vaccine.** / Consult your health care provider.  Haemophilus influenzae type b (Hib)  vaccine.** / Consult your health care provider. **Family history and personal history of risk and conditions may change your health care provider's recommendations. Document Released: 07/23/2001 Document Revised: 06/01/2013 Document Reviewed: 10/22/2010 [facility] Patient Information 2015 [place], [place]. This information is not intended to replace advice given to you by your health care provider. Make sure you discuss any questions you have with your health care provider.    Fall Prevention and Home Safety Falls cause injuries and can affect all age groups. It is possible to use preventive measures to significantly decrease the likelihood of falls. There are many simple measures which can make your home safer and prevent falls. OUTDOORS  Repair cracks and edges of walkways and driveways.  Remove high doorway thresholds.  Trim shrubbery on the main path into your home.  Have good outside lighting.  Clear walkways of tools, rocks, debris, and clutter.  Check that handrails are not broken and are securely fastened. Both sides of steps should have handrails.  Have leaves, snow, and ice cleared regularly.  Use sand or salt on walkways during winter months.  In the garage, clean up grease or oil spills. BATHROOM  Install night lights.  Install grab bars by the toilet and in the tub and shower.  Use non-skid mats or decals in the tub or shower.  Place a plastic non-slip stool in the shower to sit on, if needed.  Keep floors dry and clean up all water on the floor immediately.  Remove soap buildup in  the tub or shower on a regular basis.  Secure bath mats with non-slip, double-sided rug tape.  Remove throw rugs and tripping hazards from the floors. BEDROOMS  Install night lights.  Make sure a bedside light is easy to reach.  Do not use oversized bedding.  Keep a telephone by your bedside.  Have a firm chair with side arms to use for getting dressed.  Remove throw rugs  and tripping hazards from the floor. KITCHEN  Keep handles on pots and pans turned toward the center of the stove. Use back burners when possible.  Clean up spills quickly and allow time for drying.  Avoid walking on wet floors.  Avoid hot utensils and knives.  Position shelves so they are not too high or low.  Place commonly used objects within easy reach.  If necessary, use a sturdy step stool with a grab bar when reaching.  Keep electrical cables out of the way.  Do not use floor polish or wax that makes floors slippery. If you must use wax, use non-skid floor wax.  Remove throw rugs and tripping hazards from the floor. STAIRWAYS  Never leave objects on stairs.  Place handrails on both sides of stairways and use them. Fix any loose handrails. Make sure handrails on both sides of the stairways are as long as the stairs.  Check carpeting to make sure it is firmly attached along stairs. Make repairs to worn or loose carpet promptly.  Avoid placing throw rugs at the top or bottom of stairways, or properly secure the rug with carpet tape to prevent slippage. Get rid of throw rugs, if possible.  Have an electrician put in a light switch at the top and bottom of the stairs. OTHER FALL PREVENTION TIPS  Wear low-heel or rubber-soled shoes that are supportive and fit well. Wear closed toe shoes.  When using a stepladder, make sure it is fully opened and both spreaders are firmly locked. Do not climb a closed stepladder.  Add color or contrast paint or tape to grab bars and handrails in your home. Place contrasting color strips on first and last steps.  Learn and use mobility aids as needed. Install an electrical emergency response system.  Turn on lights to avoid dark areas. Replace light bulbs that burn out immediately. Get light switches that glow.  Arrange furniture to create clear pathways. Keep furniture in the same place.  Firmly attach carpet with non-skid or  double-sided tape.  Eliminate uneven floor surfaces.  Select a carpet pattern that does not visually hide the edge of steps.  Be aware of all pets. OTHER HOME SAFETY TIPS  Set the water temperature for 120 F (48.8 C).  Keep emergency numbers on or near the telephone.  Keep smoke detectors on every level of the home and near sleeping areas. Document Released: 05/17/2002 Document Revised: 11/26/2011 Document Reviewed: 08/16/2011 [facility] Patient Information 2015 [place], [place]. This information is not intended to replace advice given to you by your health care provider. Make sure you discuss any questions you have with your health care provider.

## 2014-04-21 NOTE — Progress Notes (Signed)
Pre visit review using our clinic review tool, if applicable. No additional management support is needed unless otherwise documented below in the visit note. 

## 2014-05-02 ENCOUNTER — Ambulatory Visit (HOSPITAL_COMMUNITY)
Admission: RE | Admit: 2014-05-02 | Discharge: 2014-05-02 | Disposition: A | Discharge status: Home / Self Care | Payer: Medicare Other | Source: Ambulatory Visit | Attending: Internal Medicine | Admitting: Internal Medicine

## 2014-05-02 ENCOUNTER — Other Ambulatory Visit (HOSPITAL_COMMUNITY): Payer: Self-pay | Admitting: Internal Medicine

## 2014-05-02 DIAGNOSIS — E785 Hyperlipidemia, unspecified: Secondary | ICD-10-CM | POA: Insufficient documentation

## 2014-05-02 DIAGNOSIS — R011 Cardiac murmur, unspecified: Secondary | ICD-10-CM

## 2014-05-02 DIAGNOSIS — I10 Essential (primary) hypertension: Secondary | ICD-10-CM | POA: Insufficient documentation

## 2014-05-02 DIAGNOSIS — R42 Dizziness and giddiness: Secondary | ICD-10-CM

## 2014-05-02 DIAGNOSIS — I359 Nonrheumatic aortic valve disorder, unspecified: Secondary | ICD-10-CM

## 2014-05-02 DIAGNOSIS — I350 Nonrheumatic aortic (valve) stenosis: Secondary | ICD-10-CM | POA: Diagnosis present

## 2014-05-02 NOTE — Progress Notes (Signed)
  Echocardiogram 2D Echocardiogram has been performed.  Darlina Sicilian M 05/02/2014, 2:49 PM

## 2014-05-02 NOTE — Progress Notes (Signed)
VASCULAR LAB PRELIMINARY  PRELIMINARY  PRELIMINARY  PRELIMINARY  Carotid Dopplers completed.    Preliminary report:  1-39% ICA stenosis.  Vertebral artery flow is antegrade.   Burdette Forehand, RVT 05/02/2014, 2:48 PM

## 2014-05-09 ENCOUNTER — Other Ambulatory Visit: Payer: Self-pay | Admitting: Internal Medicine

## 2014-10-17 ENCOUNTER — Ambulatory Visit: Payer: 59 | Admitting: Internal Medicine

## 2014-10-20 ENCOUNTER — Encounter: Payer: Self-pay | Admitting: Internal Medicine

## 2014-10-20 ENCOUNTER — Ambulatory Visit (INDEPENDENT_AMBULATORY_CARE_PROVIDER_SITE_OTHER): Payer: Medicare Other | Admitting: Internal Medicine

## 2014-10-20 VITALS — BP 128/82 | HR 64 | Temp 98.2°F | Ht 68.0 in | Wt 192.4 lb

## 2014-10-20 DIAGNOSIS — R739 Hyperglycemia, unspecified: Secondary | ICD-10-CM

## 2014-10-20 DIAGNOSIS — R42 Dizziness and giddiness: Secondary | ICD-10-CM

## 2014-10-20 DIAGNOSIS — I350 Nonrheumatic aortic (valve) stenosis: Secondary | ICD-10-CM | POA: Diagnosis not present

## 2014-10-20 DIAGNOSIS — E038 Other specified hypothyroidism: Secondary | ICD-10-CM | POA: Diagnosis not present

## 2014-10-20 DIAGNOSIS — I10 Essential (primary) hypertension: Secondary | ICD-10-CM

## 2014-10-20 HISTORY — DX: Hyperglycemia, unspecified: R73.9

## 2014-10-20 LAB — HEMOGLOBIN A1C: HEMOGLOBIN A1C: 5.6 % (ref 4.6–6.5)

## 2014-10-20 LAB — TSH: TSH: 4.74 u[IU]/mL — ABNORMAL HIGH (ref 0.35–4.50)

## 2014-10-20 NOTE — Assessment & Plan Note (Addendum)
Was seen a few months ago with dizziness, symptoms resolved.  A ECHO was done November 2015, aortic stenosis was stable. A carotid ultrasound was also done, showing 0-39% bilaterally.

## 2014-10-20 NOTE — Progress Notes (Signed)
Subjective:    Patient ID: Eric [name], male    DOB: 06-08-1946, 70 y.o.   MRN: [id number]  DOS:  10/20/2014 Type of visit - description : rov Interval history: At the time of his physical exam a few months ago he complained of dizziness, symptoms resolved. Medication list reviewed, good compliance, no apparent side effects. Labs reviewed, due for a TSH, he has hyperglycemia, will recheck the A1c as well.  Review of Systems He remains active without problems, denies chest pain, difficulty breathing, syncope or presyncope. No vomiting or diarrhea. Has experienced nausea a couple of times in the last few months.  Past Medical History  Diagnosis Date  . Hypertension   . Hyperlipidemia   . Abnormal LFTs   . Hypothyroidism   . Aortic stenosis 04/15/2011  . Macular degeneration     f/u w/ Dr Baird Cancer    Past Surgical History  Procedure Laterality Date  . Cholecystectomy    . Cardiac catheterization  11/10/00  . Tonsillectomy    . Inguinal hernia repair      (B)    History   Social History  . Marital Status: Married    Spouse Name: N/A  . Number of Children: 3  . Years of Education: N/A   Occupational History  . retired     05/2008   Social History Main Topics  . Smoking status: Former Smoker    Quit date: 04/15/1990  . Smokeless tobacco: Never Used     Comment: used to smoke 2 ppd  . Alcohol Use: No     Comment: no ETOH since the 90s  . Drug Use: No  . Sexual Activity: Not on file   Other Topics Concern  . Not on file   Social History Narrative   3 children, 7 g-kids                     Medication List       This list is accurate as of: 10/20/14  5:43 PM.  Always use your most recent med list.               aspirin 81 MG tablet  Take 81 mg by mouth daily.     levothyroxine 75 MCG tablet  Commonly known as:  SYNTHROID, LEVOTHROID  TAKE 1 TABLET BY MOUTH ONCE DAILY     metoprolol 50 MG tablet  Commonly known as:  LOPRESSOR  TAKE 1 TABLET  BY MOUTH TWICE A DAY     MULTIVITAMIN MEN PO  Take 1 tablet by mouth daily.     NIFEdipine 30 MG 24 hr tablet  Commonly known as:  PROCARDIA-XL/ADALAT-CC/NIFEDICAL-XL  TAKE 1 TABLET BY MOUTH EVERY DAY     rosuvastatin 20 MG tablet  Commonly known as:  CRESTOR  TAKE 1 TABLET BY MOUTH EVERY DAY           Objective:   Physical Exam BP 128/82 mmHg  Pulse 64  Temp(Src) 98.2 F (36.8 C) (Oral)  Ht 5\' 8"  (1.727 m)  Wt 192 lb 6 oz (87.261 kg)  BMI 29.26 kg/m2  SpO2 97% General:   Well developed, well nourished . NAD.  HEENT:  Normocephalic . Face symmetric, atraumatic Carotid pulses bilaterally Lungs:  CTA B Normal respiratory effort, no intercostal retractions, no accessory muscle use. Heart: RRR,  + syst murmur.  No pretibial edema bilaterally  Skin: Not pale. Not jaundice Neurologic:  alert & oriented X3.  Speech normal, gait appropriate  for age and unassisted Psych--  Cognition and judgment appear intact.  Cooperative with normal attention span and concentration.  Behavior appropriate. No anxious or depressed appearing.       Assessment & Plan:

## 2014-10-20 NOTE — Patient Instructions (Signed)
Get your blood work before you leave    Come back to the office by 04-2015   for a physical exam  Please schedule an appointment at the front desk    Come back fasting        

## 2014-10-20 NOTE — Assessment & Plan Note (Signed)
Well-controlled, no change 

## 2014-10-20 NOTE — Progress Notes (Signed)
Pre visit review using our clinic review tool, if applicable. No additional management support is needed unless otherwise documented below in the visit note. 

## 2014-10-20 NOTE — Assessment & Plan Note (Signed)
Resolved

## 2014-10-20 NOTE — Assessment & Plan Note (Signed)
Mild hyperglycemia noted, check A1c

## 2014-10-20 NOTE — Assessment & Plan Note (Signed)
Check a TSH, refill medications

## 2014-10-24 MED ORDER — LEVOTHYROXINE SODIUM 100 MCG PO TABS: 100.0000 ug | ORAL_TABLET | Freq: Every day | ORAL | Status: DC

## 2014-10-24 NOTE — Addendum Note (Signed)
Addended by: Wilfrid Lund on: 10/24/2014 08:10 AM   Modules accepted: Orders, Medications

## 2014-11-28 ENCOUNTER — Other Ambulatory Visit (INDEPENDENT_AMBULATORY_CARE_PROVIDER_SITE_OTHER): Payer: Medicare Other

## 2014-11-28 DIAGNOSIS — E038 Other specified hypothyroidism: Secondary | ICD-10-CM

## 2014-11-28 LAB — TSH: TSH: 1.6 u[IU]/mL (ref 0.35–4.50)

## 2014-11-29 NOTE — Addendum Note (Signed)
Addended by: Wilfrid Lund on: 11/29/2014 08:17 AM   Modules accepted: Orders, Medications

## 2015-01-20 ENCOUNTER — Other Ambulatory Visit: Payer: Self-pay | Admitting: Internal Medicine

## 2015-01-24 ENCOUNTER — Other Ambulatory Visit (INDEPENDENT_AMBULATORY_CARE_PROVIDER_SITE_OTHER): Payer: Medicare Other

## 2015-01-24 DIAGNOSIS — E038 Other specified hypothyroidism: Secondary | ICD-10-CM

## 2015-02-06 ENCOUNTER — Telehealth: Payer: Self-pay | Admitting: Internal Medicine

## 2015-02-06 NOTE — Telephone Encounter (Signed)
Noted. Awaiting TSH redraw.

## 2015-02-06 NOTE — Telephone Encounter (Signed)
Per chart, TSH was collected on 01/26/2015, but no results in chart. Can we get results from main lab? Thank you.

## 2015-02-06 NOTE — Telephone Encounter (Signed)
Relation to SW:FUXN Call back number:[phone]   Reason for call:  Patient inquiring about lab results

## 2015-02-06 NOTE — Telephone Encounter (Signed)
Labs were drawn but it was not enough blood to do the TSH.  Shanon Brow called and [facility] informing the pt that we need to redraw the lab for the TSH.//AB/CMA

## 2015-02-16 ENCOUNTER — Other Ambulatory Visit (INDEPENDENT_AMBULATORY_CARE_PROVIDER_SITE_OTHER): Payer: Medicare Other

## 2015-02-16 ENCOUNTER — Other Ambulatory Visit: Payer: Self-pay

## 2015-02-16 DIAGNOSIS — Z1159 Encounter for screening for other viral diseases: Secondary | ICD-10-CM

## 2015-02-16 DIAGNOSIS — E038 Other specified hypothyroidism: Secondary | ICD-10-CM

## 2015-02-16 LAB — TSH: TSH: 0.81 u[IU]/mL (ref 0.35–4.50)

## 2015-02-17 LAB — HEPATITIS C ANTIBODY: HCV AB: NEGATIVE

## 2015-04-04 ENCOUNTER — Ambulatory Visit (INDEPENDENT_AMBULATORY_CARE_PROVIDER_SITE_OTHER): Payer: Medicare Other | Admitting: Behavioral Health

## 2015-04-04 DIAGNOSIS — Z23 Encounter for immunization: Secondary | ICD-10-CM | POA: Diagnosis not present

## 2015-04-04 NOTE — Progress Notes (Signed)
Pre visit review using our clinic review tool, if applicable. No additional management support is needed unless otherwise documented below in the visit note. 

## 2015-04-24 ENCOUNTER — Other Ambulatory Visit: Payer: Self-pay | Admitting: Internal Medicine

## 2015-04-26 ENCOUNTER — Telehealth: Payer: Self-pay

## 2015-04-26 NOTE — Telephone Encounter (Signed)
Pre Visit call completed with patient.  

## 2015-04-27 ENCOUNTER — Encounter: Payer: Self-pay | Admitting: Internal Medicine

## 2015-04-27 ENCOUNTER — Ambulatory Visit (INDEPENDENT_AMBULATORY_CARE_PROVIDER_SITE_OTHER): Payer: Medicare Other | Admitting: Internal Medicine

## 2015-04-27 VITALS — BP 126/78 | HR 65 | Temp 97.7°F | Ht 68.0 in | Wt 192.4 lb

## 2015-04-27 DIAGNOSIS — I10 Essential (primary) hypertension: Secondary | ICD-10-CM

## 2015-04-27 DIAGNOSIS — E785 Hyperlipidemia, unspecified: Secondary | ICD-10-CM

## 2015-04-27 DIAGNOSIS — Z0000 Encounter for general adult medical examination without abnormal findings: Secondary | ICD-10-CM

## 2015-04-27 DIAGNOSIS — Z09 Encounter for follow-up examination after completed treatment for conditions other than malignant neoplasm: Secondary | ICD-10-CM

## 2015-04-27 DIAGNOSIS — E039 Hypothyroidism, unspecified: Secondary | ICD-10-CM | POA: Diagnosis not present

## 2015-04-27 DIAGNOSIS — K08109 Complete loss of teeth, unspecified cause, unspecified class: Secondary | ICD-10-CM | POA: Insufficient documentation

## 2015-04-27 HISTORY — DX: Encounter for follow-up examination after completed treatment for conditions other than malignant neoplasm: Z09

## 2015-04-27 LAB — ALT: ALT: 52 U/L (ref 0–53)

## 2015-04-27 LAB — BASIC METABOLIC PANEL
BUN: 11 mg/dL (ref 6–23)
CO2: 25 meq/L (ref 19–32)
CREATININE: 0.97 mg/dL (ref 0.40–1.50)
Calcium: 9.6 mg/dL (ref 8.4–10.5)
Chloride: 105 mEq/L (ref 96–112)
GFR: 81.5 mL/min (ref >60.00)
Glucose, Bld: 106 mg/dL — ABNORMAL HIGH (ref 70–99)
Potassium: 3.9 mEq/L (ref 3.5–5.1)
SODIUM: 140 meq/L (ref 135–145)

## 2015-04-27 LAB — LIPID PANEL
CHOL/HDL RATIO: 3
Cholesterol: 143 mg/dL (ref 0–200)
HDL: 47.4 mg/dL (ref >39.00)
LDL Cholesterol: 71 mg/dL (ref 0–99)
NONHDL: 96.07
Triglycerides: 123 mg/dL (ref 0.0–149.0)
VLDL: 24.6 mg/dL (ref 0.0–40.0)

## 2015-04-27 LAB — TSH: TSH: 0.2 u[IU]/mL — ABNORMAL LOW (ref 0.35–4.50)

## 2015-04-27 LAB — AST: AST: 47 U/L — ABNORMAL HIGH (ref 0–37)

## 2015-04-27 MED ORDER — NIFEDIPINE ER OSMOTIC RELEASE 30 MG PO TB24: 30.0000 mg | ORAL_TABLET | Freq: Every day | ORAL | Status: DC

## 2015-04-27 MED ORDER — METOPROLOL TARTRATE 50 MG PO TABS: 50.0000 mg | ORAL_TABLET | Freq: Two times a day (BID) | ORAL | Status: DC

## 2015-04-27 NOTE — Progress Notes (Signed)
Subjective:    Patient ID: Eric [name], male    DOB: 26-Jul-1945, 69 y.o.   MRN: [id number]  DOS:  04/27/2015 Type of visit - description :     Here for Medicare AWV: 1. Risk factors based on Past M, S, F history: reviewed 2. Physical Activities: active, yard work, occ walk   3. Depression/mood:  Neg screen  4. Hearing:  No problemss noted or reported   5. ADL's:  Independent   6. Fall Risk: no recent falls, see instructions   7. home Safety: does feelsafe at home   8. Height, weight, &visual acuity: see VS, sees Dr Baird Cancer, dx w/ mac degeneration, vision stable, on injections 9. Counseling: provided 10. Labs ordered based on risk factors: if needed   11. Referral Coordination: if needed 12.Care Plan, see assessment and plan   13. Cognitive Assessment: motor & cognition intact and appropriate for age  61. Providers list updated 15. HC-POA discussed   In addition, today we discussed the following: Hypothyroidism: Good compliance with medications High cholesterol: Taking generic Crestor, no apparent side effects HTN: Good compliance of medicines: BPs usually very good, has not check in 2 months    Review of Systems  Constitutional: No fever. No chills. No unexplained wt changes. No unusual sweats  HEENT: No dental problems, no ear discharge, no facial swelling, no voice changes. No eye discharge, no eye  redness , no  intolerance to light   Respiratory: No wheezing , no  difficulty breathing. No cough , no mucus production  Cardiovascular: No CP, no leg swelling , no  Palpitations  GI: no nausea, no vomiting, no diarrhea , no  abdominal pain.  No blood in the stools. No dysphagia, no odynophagia    Endocrine: No polyphagia, no polyuria , no polydipsia  GU: No dysuria, gross hematuria, difficulty urinating. No urinary urgency, no frequency.  Musculoskeletal: No joint swellings or unusual aches or pains  Skin: No change in the color of the skin, palor , no   Rash  Allergic, immunologic: No environmental allergies , no  food allergies  Neurological: No dizziness no  syncope. No headaches. No diplopia, no slurred, no slurred speech, no motor deficits, no facial  Numbness  Hematological: No enlarged lymph nodes, no easy bruising , no unusual bleedings  Psychiatry: No suicidal ideas, no hallucinations, no beavior problems, no confusion.  No unusual/severe anxiety, no depression  Past Medical History  Diagnosis Date  . Hypertension   . Hyperlipidemia   . Abnormal LFTs   . Hypothyroidism   . Aortic stenosis 04/15/2011  . Macular degeneration     f/u w/ Dr Baird Cancer    Past Surgical History  Procedure Laterality Date  . Cholecystectomy    . Cardiac catheterization  11/10/00  . Tonsillectomy    . Inguinal hernia repair      (B)    Social History   Social History  . Marital Status: Married    Spouse Name: N/A  . Number of Children: 3  . Years of Education: N/A   Occupational History  . retired     05/2008   Social History Main Topics  . Smoking status: Former Smoker    Quit date: 04/15/1990  . Smokeless tobacco: Never Used     Comment: used to smoke 2 ppd  . Alcohol Use: No     Comment: no ETOH since the 90s  . Drug Use: No  . Sexual Activity: Not on file   Other  Topics Concern  . Not on file   Social History Narrative   3 children, 7 g-kids                  Family History  Problem Relation Age of Onset  . Diabetes Mother   . Diabetes Brother     ?  . Coronary artery disease Brother     Male 1st degree relative >50, had CABG in his 82s  . Stroke Mother   . Colon cancer Neg Hx   . Prostate cancer Neg Hx        Medication List       This list is accurate as of: 04/27/15  5:15 PM.  Always use your most recent med list.               aspirin 81 MG tablet  Take 81 mg by mouth daily.     levothyroxine 100 MCG tablet  Commonly known as:  SYNTHROID, LEVOTHROID  Take 1 tablet (100 mcg total) by mouth  daily.     metoprolol 50 MG tablet  Commonly known as:  LOPRESSOR  Take 1 tablet (50 mg total) by mouth 2 (two) times daily.     MULTIVITAMIN MEN PO  Take 1 tablet by mouth daily.     NIFEdipine 30 MG 24 hr tablet  Commonly known as:  PROCARDIA-XL/ADALAT-CC/NIFEDICAL-XL  Take 1 tablet (30 mg total) by mouth daily.     rosuvastatin 20 MG tablet  Commonly known as:  CRESTOR  Take 1 tablet (20 mg total) by mouth daily.           Objective:   Physical Exam BP 126/78 mmHg  Pulse 65  Temp(Src) 97.7 F (36.5 C) (Oral)  Ht 5\' 8"  (1.727 m)  Wt 192 lb 6 oz (87.261 kg)  BMI 29.26 kg/m2  SpO2 97% General:   Well developed, well nourished . NAD.  Neck:    No  thyromegaly   HEENT:  Normocephalic . Face symmetric, atraumatic Lungs:  CTA B Normal respiratory effort, no intercostal retractions, no accessory muscle use. Heart: RRR,  + systolic murmur.  No pretibial edema bilaterally  Abdomen:  Not distended, soft, non-tender. No rebound or rigidity. No bruit Skin: Exposed areas without rash. Not pale. Not jaundice Neurologic:  alert & oriented X3.  Speech normal, gait appropriate for age and unassisted Strength symmetric and appropriate for age.  Psych: Cognition and judgment appear intact.  Cooperative with normal attention span and concentration.  Behavior appropriate. No anxious or depressed appearing.    Assessment & Plan:   Assessment > HTN Hyperlipidemia Hypothyroidism Increase LFTs Aortic stenosis dx  2012, last echo 04-2014 AoS stable Macular degeneration +FH CAD Brother age 21s  PLAN HTN: Well-controlled, continue present care, check a BMP Hyperlipidemia: On generic Crestor, tolerates well, check labs Hypothyroidism, due for a TSH Ao stenosis: Asymptomatic Mac.  degeneration: Reportedly responding well to treatment. RTC one year. May need to get his TSH check in few months

## 2015-04-27 NOTE — Patient Instructions (Signed)
Get your blood work before you leave     Please consider visit these websites for more information:  www.begintheconversation.org  theconversationproject.org  Next visit in one year, fasting for a physical exam.    Fall Prevention and Home Safety Falls cause injuries and can affect all age groups. It is possible to use preventive measures to significantly decrease the likelihood of falls. There are many simple measures which can make your home safer and prevent falls. OUTDOORS  Repair cracks and edges of walkways and driveways.  Remove high doorway thresholds.  Trim shrubbery on the main path into your home.  Have good outside lighting.  Clear walkways of tools, rocks, debris, and clutter.  Check that handrails are not broken and are securely fastened. Both sides of steps should have handrails.  Have leaves, snow, and ice cleared regularly.  Use sand or salt on walkways during winter months.  In the garage, clean up grease or oil spills. BATHROOM  Install night lights.  Install grab bars by the toilet and in the tub and shower.  Use non-skid mats or decals in the tub or shower.  Place a plastic non-slip stool in the shower to sit on, if needed.  Keep floors dry and clean up all water on the floor immediately.  Remove soap buildup in the tub or shower on a regular basis.  Secure bath mats with non-slip, double-sided rug tape.  Remove throw rugs and tripping hazards from the floors. BEDROOMS  Install night lights.  Make sure a bedside light is easy to reach.  Do not use oversized bedding.  Keep a telephone by your bedside.  Have a firm chair with side arms to use for getting dressed.  Remove throw rugs and tripping hazards from the floor. KITCHEN  Keep handles on pots and pans turned toward the center of the stove. Use back burners when possible.  Clean up spills quickly and allow time for drying.  Avoid walking on wet floors.  Avoid hot utensils  and knives.  Position shelves so they are not too high or low.  Place commonly used objects within easy reach.  If necessary, use a sturdy step stool with a grab bar when reaching.  Keep electrical cables out of the way.  Do not use floor polish or wax that makes floors slippery. If you must use wax, use non-skid floor wax.  Remove throw rugs and tripping hazards from the floor. STAIRWAYS  Never leave objects on stairs.  Place handrails on both sides of stairways and use them. Fix any loose handrails. Make sure handrails on both sides of the stairways are as long as the stairs.  Check carpeting to make sure it is firmly attached along stairs. Make repairs to worn or loose carpet promptly.  Avoid placing throw rugs at the top or bottom of stairways, or properly secure the rug with carpet tape to prevent slippage. Get rid of throw rugs, if possible.  Have an electrician put in a light switch at the top and bottom of the stairs. OTHER FALL PREVENTION TIPS  Wear low-heel or rubber-soled shoes that are supportive and fit well. Wear closed toe shoes.  When using a stepladder, make sure it is fully opened and both spreaders are firmly locked. Do not climb a closed stepladder.  Add color or contrast paint or tape to grab bars and handrails in your home. Place contrasting color strips on first and last steps.  Learn and use mobility aids as needed. Install an  electrical emergency response system.  Turn on lights to avoid dark areas. Replace light bulbs that burn out immediately. Get light switches that glow.  Arrange furniture to create clear pathways. Keep furniture in the same place.  Firmly attach carpet with non-skid or double-sided tape.  Eliminate uneven floor surfaces.  Select a carpet pattern that does not visually hide the edge of steps.  Be aware of all pets. OTHER HOME SAFETY TIPS  Set the water temperature for 120 F (48.8 C).  Keep emergency numbers on or near  the telephone.  Keep smoke detectors on every level of the home and near sleeping areas. Document Released: 05/17/2002 Document Revised: 11/26/2011 Document Reviewed: 08/16/2011 [facility] Patient Information 2015 [place], [place]. This information is not intended to replace advice given to you by your health care provider. Make sure you discuss any questions you have with your health care provider.   Preventive Care for Adults Ages 52 and over  Blood pressure check.** / Every 1 to 2 years.  Lipid and cholesterol check.**/ Every 5 years beginning at age 48.  Lung cancer screening. / Every year if you are aged 20-80 years and have a 30-pack-year history of smoking and currently smoke or have quit within the past 15 years. Yearly screening is stopped once you have quit smoking for at least 15 years or develop a health problem that would prevent you from having lung cancer treatment.  Fecal occult blood test (FOBT) of stool. / Every year beginning at age 46 and continuing until age 56. You may not have to do this test if you get a colonoscopy every 10 years.  Flexible sigmoidoscopy** or colonoscopy.** / Every 5 years for a flexible sigmoidoscopy or every 10 years for a colonoscopy beginning at age 73 and continuing until age 14.  Hepatitis C blood test.** / For all people born from [birth year] through 1965 and any individual with known risks for hepatitis C.  Abdominal aortic aneurysm (AAA) screening.** / A one-time screening for ages 68 to 91 years who are current or former smokers.  Skin self-exam. / Monthly.  Influenza vaccine. / Every year.  Tetanus, diphtheria, and acellular pertussis (Tdap/Td) vaccine.** / 1 dose of Td every 10 years.  Varicella vaccine.** / Consult your health care provider.  Zoster vaccine.** / 1 dose for adults aged 22 years or older.  Pneumococcal 13-valent conjugate (PCV13) vaccine.** / Consult your health care provider.  Pneumococcal polysaccharide (PPSV23)  vaccine.** / 1 dose for all adults aged 21 years and older.  Meningococcal vaccine.** / Consult your health care provider.  Hepatitis A vaccine.** / Consult your health care provider.  Hepatitis B vaccine.** / Consult your health care provider.  Haemophilus influenzae type b (Hib) vaccine.** / Consult your health care provider. **Family history and personal history of risk and conditions may change your health care provider's recommendations. Document Released: 07/23/2001 Document Revised: 06/01/2013 Document Reviewed: 10/22/2010 [facility] Patient Information 2015 [place], [place]. This information is not intended to replace advice given to you by your health care provider. Make sure you discuss any questions you have with your health care provider.

## 2015-04-27 NOTE — Assessment & Plan Note (Addendum)
Td 2012 Had a flu shot  Pneumonia shot 2013 prevnar-- 2015 zostavax--2016  per patient: Cscope x 2, last 04-2006, next due 2014 (Dr. Deatra Ina pt states he talked w/ GI 10-14, plans to postpone it until 2017, encouraged to do it on early 2017.  DRE and PSA  (-)2014, PSAs stable for years, reassess in 2017. diet and exercise discussed + FH CAD,currently asymptomatic, on aspirin, plan is to continue controlling his CV RF  Labs

## 2015-04-27 NOTE — Assessment & Plan Note (Signed)
HTN: Well-controlled, continue present care, check a BMP Hyperlipidemia: On generic Crestor, tolerates well, check labs Hypothyroidism, due for a TSH Ao stenosis: Asymptomatic Mac.  degeneration: Reportedly responding well to treatment. RTC one year. May need to get his TSH check in few months

## 2015-04-27 NOTE — Progress Notes (Signed)
Pre visit review using our clinic review tool, if applicable. No additional management support is needed unless otherwise documented below in the visit note. 

## 2015-05-01 MED ORDER — LEVOTHYROXINE SODIUM 100 MCG PO TABS: 100.0000 ug | ORAL_TABLET | Freq: Every day | ORAL | Status: DC

## 2015-05-01 NOTE — Addendum Note (Signed)
Addended by: Wilfrid Lund on: 05/01/2015 08:21 AM   Modules accepted: Orders

## 2015-05-20 ENCOUNTER — Other Ambulatory Visit: Payer: Self-pay | Admitting: Internal Medicine

## 2015-05-21 ENCOUNTER — Other Ambulatory Visit: Payer: Self-pay | Admitting: Internal Medicine

## 2015-05-21 DIAGNOSIS — I350 Nonrheumatic aortic (valve) stenosis: Secondary | ICD-10-CM

## 2015-05-25 ENCOUNTER — Other Ambulatory Visit: Payer: Self-pay | Admitting: Internal Medicine

## 2015-05-26 ENCOUNTER — Encounter: Payer: Self-pay | Admitting: Internal Medicine

## 2015-06-11 HISTORY — PX: COLONOSCOPY: SHX174

## 2015-06-11 HISTORY — PX: POLYPECTOMY: SHX149

## 2015-06-27 ENCOUNTER — Other Ambulatory Visit: Payer: Self-pay

## 2015-06-27 ENCOUNTER — Ambulatory Visit (HOSPITAL_COMMUNITY): Payer: Medicare Other | Attending: Cardiology

## 2015-06-27 DIAGNOSIS — I352 Nonrheumatic aortic (valve) stenosis with insufficiency: Secondary | ICD-10-CM | POA: Diagnosis not present

## 2015-06-27 DIAGNOSIS — I10 Essential (primary) hypertension: Secondary | ICD-10-CM | POA: Diagnosis not present

## 2015-06-27 DIAGNOSIS — I350 Nonrheumatic aortic (valve) stenosis: Secondary | ICD-10-CM | POA: Insufficient documentation

## 2015-08-01 ENCOUNTER — Other Ambulatory Visit (INDEPENDENT_AMBULATORY_CARE_PROVIDER_SITE_OTHER): Payer: Medicare Other

## 2015-08-01 DIAGNOSIS — E785 Hyperlipidemia, unspecified: Secondary | ICD-10-CM

## 2015-08-01 DIAGNOSIS — E039 Hypothyroidism, unspecified: Secondary | ICD-10-CM

## 2015-08-01 LAB — AST: AST: 32 U/L (ref 0–37)

## 2015-08-01 LAB — ALT: ALT: 31 U/L (ref 0–53)

## 2015-08-01 LAB — TSH: TSH: 0.24 u[IU]/mL — AB (ref 0.35–4.50)

## 2015-08-04 ENCOUNTER — Ambulatory Visit (INDEPENDENT_AMBULATORY_CARE_PROVIDER_SITE_OTHER): Payer: Medicare Other | Admitting: Medical

## 2015-08-04 ENCOUNTER — Encounter: Payer: Self-pay | Admitting: Medical

## 2015-08-04 VITALS — BP 122/76 | HR 77 | Temp 98.8°F | Ht 68.0 in | Wt 188.8 lb

## 2015-08-04 DIAGNOSIS — R05 Cough: Secondary | ICD-10-CM

## 2015-08-04 DIAGNOSIS — J111 Influenza due to unidentified influenza virus with other respiratory manifestations: Secondary | ICD-10-CM | POA: Diagnosis not present

## 2015-08-04 DIAGNOSIS — R059 Cough, unspecified: Secondary | ICD-10-CM

## 2015-08-04 LAB — POCT INFLUENZA A/B
INFLUENZA B, POC: NEGATIVE
Influenza A, POC: POSITIVE — AB

## 2015-08-04 MED ORDER — AZITHROMYCIN 250 MG PO TABS: ORAL_TABLET | ORAL | Status: DC

## 2015-08-04 MED ORDER — OSELTAMIVIR PHOSPHATE 75 MG PO CAPS: 75.0000 mg | ORAL_CAPSULE | Freq: Two times a day (BID) | ORAL | Status: DC

## 2015-08-04 MED ORDER — BENZONATATE 100 MG PO CAPS
100.0000 mg | ORAL_CAPSULE | Freq: Three times a day (TID) | ORAL | Status: DC | PRN
Start: 2015-08-04 — End: 2016-03-06

## 2015-08-04 MED ORDER — LEVOTHYROXINE SODIUM 88 MCG PO TABS: 88.0000 ug | ORAL_TABLET | Freq: Every day | ORAL | Status: DC

## 2015-08-04 NOTE — Progress Notes (Signed)
Pre visit review using our clinic review tool, if applicable. No additional management support is needed unless otherwise documented below in the visit note. 

## 2015-08-04 NOTE — Addendum Note (Signed)
Addended by: Damita Dunnings D on: 08/04/2015 09:05 AM   Modules accepted: Orders, Medications

## 2015-08-04 NOTE — Patient Instructions (Addendum)
For flu syndrome will go ahead and rx tamiflu. My MA will give you the results of the test. But I am treating you clinically.  For cough benzonatate.  If you have any secondary bacterial type infection symptoms as discussed then start azithromycin.  You mentioned 2 loose stools today. Keep in mind antibiotic may loosen stools further. If you fill azithromycin then recommend starting probiotics. If any severe diarrhea with antibiotic then let us know.  For body aches you could use alleve otc.  Follow up in 7 days any persisting symptoms or as needed.  Note his flu test did come back +.

## 2015-08-04 NOTE — Progress Notes (Signed)
Subjective:    Patient ID: Eric [name], male    DOB: 04/28/1946, 70 y.o.   MRN: [id number]  HPI  Pt with coughing since Wednesday. Cough is dry. Pt has runny nose. No sinus pressure. No ear pain. Pt felt feverish yesterday. Sweating yesterday. Some body aches.  Pt states son sick. But has not been diagnosed.    Review of Systems  Constitutional: Positive for fever, diaphoresis and fatigue.  HENT: Positive for congestion and rhinorrhea. Negative for ear pain, sneezing and sore throat.   Respiratory: Positive for cough. Negative for chest tightness, shortness of breath and wheezing.   Cardiovascular: Negative for chest pain and palpitations.  Gastrointestinal: Negative for abdominal pain.       2 mild loose stools today.  Musculoskeletal: Negative for back pain.  Skin: Negative for rash.  Hematological: Negative for adenopathy. Does not bruise/bleed easily.  Psychiatric/Behavioral: Negative for behavioral problems and confusion.    Past Medical History  Diagnosis Date  . Hypertension   . Hyperlipidemia   . Abnormal LFTs   . Hypothyroidism   . Aortic stenosis 04/15/2011  . Macular degeneration     f/u w/ Dr Baird Cancer    Social History   Social History  . Marital Status: Married    Spouse Name: N/A  . Number of Children: 3  . Years of Education: N/A   Occupational History  . retired     05/2008   Social History Main Topics  . Smoking status: Former Smoker    Quit date: 04/15/1990  . Smokeless tobacco: Never Used     Comment: used to smoke 2 ppd  . Alcohol Use: No     Comment: no ETOH since the 90s  . Drug Use: No  . Sexual Activity: Not on file   Other Topics Concern  . Not on file   Social History Narrative   3 children, 7 g-kids                 Past Surgical History  Procedure Laterality Date  . Cholecystectomy    . Cardiac catheterization  11/10/00  . Tonsillectomy    . Inguinal hernia repair      (B)    Family History  Problem Relation  Age of Onset  . Diabetes Mother   . Diabetes Brother     ?  . Coronary artery disease Brother     Male 1st degree relative >50, had CABG in his 3s  . Stroke Mother   . Colon cancer Neg Hx   . Prostate cancer Neg Hx     Allergies  Allergen Reactions  . Atorvastatin     REACTION: myalgia  . Ezetimibe     REACTION: myalgia    Current Outpatient Prescriptions on File Prior to Visit  Medication Sig Dispense Refill  . aspirin 81 MG tablet Take 81 mg by mouth daily.      Marland Kitchen levothyroxine (SYNTHROID, LEVOTHROID) 88 MCG tablet Take 1 tablet (88 mcg total) by mouth daily before breakfast. 30 tablet 3  . metoprolol (LOPRESSOR) 50 MG tablet Take 1 tablet (50 mg total) by mouth 2 (two) times daily. 60 tablet 12  . NIFEdipine (PROCARDIA-XL/ADALAT-CC/NIFEDICAL-XL) 30 MG 24 hr tablet Take 1 tablet (30 mg total) by mouth daily. 30 tablet 12  . rosuvastatin (CRESTOR) 20 MG tablet Take 1 tablet (20 mg total) by mouth daily. 30 tablet 12   No current facility-administered medications on file prior to visit.    BP 122/76  mmHg  Pulse 77  Temp(Src) 98.8 F (37.1 C) (Oral)  Ht 5\' 8"  (1.727 m)  Wt 188 lb 12.8 oz (85.639 kg)  BMI 28.71 kg/m2  SpO2 98%       Objective:   Physical Exam  General  Mental Status - Alert. General Appearance - Well groomed. Not in acute distress.  Skin Rashes- No Rashes.  HEENT Head- Normal. Ear Auditory Canal - Left- Normal. Right - Normal.Tympanic Membrane- Left- Normal. Right- Normal. Eye Sclera/Conjunctiva- Left- Normal. Right- Normal. Nose & Sinuses Nasal Mucosa- Left-  Boggy and Congested. Right-  Boggy and  Congested.Bilateral maxillary and frontal sinus pressure. Mouth & Throat Lips: Upper Lip- Normal: no dryness, cracking, pallor, cyanosis, or vesicular eruption. Lower Lip-Normal: no dryness, cracking, pallor, cyanosis or vesicular eruption. Buccal Mucosa- Bilateral- No Aphthous ulcers. Oropharynx- No Discharge or Erythema. Tonsils:  Characteristics- Bilateral- No Erythema or Congestion. Size/Enlargement- Bilateral- No enlargement. Discharge- bilateral-None.  Neck Neck- Supple. No Masses.   Chest and Lung Exam Auscultation: Breath Sounds:-Clear even and unlabored.  Cardiovascular Auscultation:Rythm- Regular, rate and rhythm. Murmurs & Other Heart Sounds:Ausculatation of the heart reveal- No Murmurs.  Lymphatic Head & Neck General Head & Neck Lymphatics: Bilateral: Description- faint sore rt submandibar node but not swollen.       Assessment & Plan:  For flu syndrome will go ahead and rx tamiflu. My MA will give you the results of the test. But I am treating you clinically.  For cough benzonatate.  If you have any secondary bacterial type infection symptoms as discussed then start azithromycin.  You mentioned 2 loose stools today. Keep in mind antibiotic may loosen stools further. If you fill azithromycin then recommend starting probiotics. If any severe diarrhea with antibiotic then let us know.  For body aches you could use alleve otc.  Follow up in 7 days any persisting symptoms or as needed.

## 2015-11-16 ENCOUNTER — Telehealth: Payer: Self-pay

## 2015-11-16 DIAGNOSIS — E039 Hypothyroidism, unspecified: Secondary | ICD-10-CM

## 2015-11-16 NOTE — Telephone Encounter (Signed)
TSH ordered  My Chart message sent

## 2015-11-16 NOTE — Telephone Encounter (Signed)
-----   Message from Colon Branch, MD sent at 11/15/2015  5:41 PM EDT ----- Regarding: TSH Due for TSH, please put a order in, send a message via mychart, needs to come to the office for labs.

## 2015-11-17 NOTE — Telephone Encounter (Signed)
Patient scheduled for Monday 11/20/14 at 8am for labs only

## 2015-11-20 ENCOUNTER — Other Ambulatory Visit (INDEPENDENT_AMBULATORY_CARE_PROVIDER_SITE_OTHER): Payer: Medicare Other

## 2015-11-20 DIAGNOSIS — E039 Hypothyroidism, unspecified: Secondary | ICD-10-CM

## 2015-11-20 LAB — TSH: TSH: 0.47 u[IU]/mL (ref 0.35–4.50)

## 2015-11-30 ENCOUNTER — Other Ambulatory Visit: Payer: Self-pay | Admitting: Internal Medicine

## 2016-03-06 ENCOUNTER — Encounter: Payer: Self-pay | Admitting: Internal Medicine

## 2016-03-06 ENCOUNTER — Ambulatory Visit (INDEPENDENT_AMBULATORY_CARE_PROVIDER_SITE_OTHER): Payer: Medicare Other | Admitting: Internal Medicine

## 2016-03-06 VITALS — BP 122/68 | HR 91 | Temp 98.1°F | Resp 14 | Ht 68.0 in | Wt 194.4 lb

## 2016-03-06 DIAGNOSIS — J209 Acute bronchitis, unspecified: Secondary | ICD-10-CM | POA: Diagnosis not present

## 2016-03-06 MED ORDER — ALBUTEROL SULFATE HFA 108 (90 BASE) MCG/ACT IN AERS: 2.0000 | INHALATION_SPRAY | Freq: Four times a day (QID) | RESPIRATORY_TRACT | 1 refills | Status: DC | PRN

## 2016-03-06 MED ORDER — DOXYCYCLINE HYCLATE 100 MG PO TABS: 100.0000 mg | ORAL_TABLET | Freq: Two times a day (BID) | ORAL | 0 refills | Status: DC

## 2016-03-06 NOTE — Progress Notes (Signed)
Pre visit review using our clinic review tool, if applicable. No additional management support is needed unless otherwise documented below in the visit note. 

## 2016-03-06 NOTE — Progress Notes (Signed)
Subjective:    Patient ID: Eric [name], male    DOB: 1945/12/06, 70 y.o.   MRN: [id number]  DOS:  03/06/2016 Type of visit - description : Acute Interval history: Symptoms started approximately 2 weeks ago, wife also affected: Sore throat, then chest and sinus congestion, cough, very intense at times. Occasionally has a small amount of greenish sputum production. Taking OTCs without much help.   Review of Systems Had a low-grade temperature the first day, no chills. No nausea, vomiting, diarrhea Mild wheezing noted.  Past Medical History:  Diagnosis Date  . Abnormal LFTs   . Aortic stenosis 04/15/2011  . Hyperlipidemia   . Hypertension   . Hypothyroidism   . Macular degeneration    f/u w/ Dr Baird Cancer    Past Surgical History:  Procedure Laterality Date  . CARDIAC CATHETERIZATION  11/10/00  . CHOLECYSTECTOMY    . INGUINAL HERNIA REPAIR     (B)  . TONSILLECTOMY      Social History   Social History  . Marital status: Married    Spouse name: N/A  . Number of children: 3  . Years of education: N/A   Occupational History  . retired     05/2008   Social History Main Topics  . Smoking status: Former Smoker    Quit date: 04/15/1990  . Smokeless tobacco: Never Used     Comment: used to smoke 2 ppd  . Alcohol use No     Comment: no ETOH since the 90s  . Drug use: No  . Sexual activity: Not on file   Other Topics Concern  . Not on file   Social History Narrative   3 children, 7 g-kids                     Medication List       Accurate as of 03/06/16  2:47 PM. Always use your most recent med list.          aspirin 81 MG tablet Take 81 mg by mouth daily.   azithromycin 250 MG tablet Commonly known as:  ZITHROMAX Take 2 tablets by mouth on day 1, followed by 1 tablet by mouth daily for 4 days.   benzonatate 100 MG capsule Commonly known as:  TESSALON Take 1 capsule (100 mg total) by mouth 3 (three) times daily as needed.   levothyroxine 88  MCG tablet Commonly known as:  SYNTHROID, LEVOTHROID Take 1 tablet (88 mcg total) by mouth daily before breakfast.   metoprolol 50 MG tablet Commonly known as:  LOPRESSOR Take 1 tablet (50 mg total) by mouth 2 (two) times daily.   NIFEdipine 30 MG 24 hr tablet Commonly known as:  PROCARDIA-XL/ADALAT-CC/NIFEDICAL-XL Take 1 tablet (30 mg total) by mouth daily.   oseltamivir 75 MG capsule Commonly known as:  TAMIFLU Take 1 capsule (75 mg total) by mouth 2 (two) times daily.   rosuvastatin 20 MG tablet Commonly known as:  CRESTOR Take 1 tablet (20 mg total) by mouth daily.          Objective:   Physical Exam BP 122/68 (BP Location: Left Arm, Patient Position: Sitting, Cuff Size: Normal)   Pulse 91   Temp 98.1 F (36.7 C) (Oral)   Resp 14   Ht 5\' 8"  (1.727 m)   Wt 194 lb 6 oz (88.2 kg)   SpO2 96%   BMI 29.55 kg/m  General:   Well developed, well nourished . NAD.  HEENT:  Normocephalic .  Face symmetric, atraumatic. TMs normal, throat symmetric and not red, nose not congested, sinuses no TTP Lungs:  End expiratory wheezing, scattered rhonchi, no crackles. No distress. Normal respiratory effort, no intercostal retractions, no accessory muscle use. Heart: RRR,  + systolic murmur.  No pretibial edema bilaterally  Skin: Not pale. Not jaundice Neurologic:  alert & oriented X3.  Speech normal, gait appropriate for age and unassisted Psych--  Cognition and judgment appear intact.  Cooperative with normal attention span and concentration.  Behavior appropriate. No anxious or depressed appearing.      Assessment & Plan:   Assessment > HTN Hyperlipidemia Hypothyroidism Increase LFTs Aortic stenosis dx  2012, last echo 04-2014 AoS stable Macular degeneration +FH CAD Brother age 57s  PLAN Bronchitis with mild bronchospasm: See instructions.

## 2016-03-06 NOTE — Patient Instructions (Signed)
Rest, fluids , tylenol  For cough:  Take Mucinex DM twice a day as needed until better If wheezing or persistent cough: use albuterol   For nasal congestion: Use OTC Nasocort or Flonase : 2 nasal sprays on each side of the nose in the morning until you feel better    Take the antibiotic as prescribed -- doxycycline   Call if not gradually better over the next  10 days  Call anytime if the symptoms are severe

## 2016-03-07 NOTE — Assessment & Plan Note (Signed)
Bronchitis with mild bronchospasm: See instructions.

## 2016-03-28 ENCOUNTER — Ambulatory Visit (INDEPENDENT_AMBULATORY_CARE_PROVIDER_SITE_OTHER): Payer: Medicare Other | Admitting: Behavioral Health

## 2016-03-28 DIAGNOSIS — Z23 Encounter for immunization: Secondary | ICD-10-CM

## 2016-04-18 ENCOUNTER — Encounter: Payer: Self-pay | Admitting: Gastroenterology

## 2016-04-26 ENCOUNTER — Ambulatory Visit (INDEPENDENT_AMBULATORY_CARE_PROVIDER_SITE_OTHER): Payer: Medicare Other | Admitting: Internal Medicine

## 2016-04-26 ENCOUNTER — Encounter: Payer: Self-pay | Admitting: Internal Medicine

## 2016-04-26 VITALS — BP 126/76 | HR 70 | Temp 97.8°F | Resp 14 | Ht 68.0 in | Wt 193.4 lb

## 2016-04-26 DIAGNOSIS — I10 Essential (primary) hypertension: Secondary | ICD-10-CM | POA: Diagnosis not present

## 2016-04-26 DIAGNOSIS — Z125 Encounter for screening for malignant neoplasm of prostate: Secondary | ICD-10-CM

## 2016-04-26 DIAGNOSIS — E785 Hyperlipidemia, unspecified: Secondary | ICD-10-CM | POA: Diagnosis not present

## 2016-04-26 DIAGNOSIS — E038 Other specified hypothyroidism: Secondary | ICD-10-CM

## 2016-04-26 DIAGNOSIS — Z0000 Encounter for general adult medical examination without abnormal findings: Secondary | ICD-10-CM | POA: Diagnosis not present

## 2016-04-26 LAB — LIPID PANEL
CHOLESTEROL: 152 mg/dL (ref 0–200)
HDL: 53.4 mg/dL (ref >39.00)
LDL Cholesterol: 80 mg/dL (ref 0–99)
NonHDL: 98.99
TRIGLYCERIDES: 94 mg/dL (ref 0.0–149.0)
Total CHOL/HDL Ratio: 3
VLDL: 18.8 mg/dL (ref 0.0–40.0)

## 2016-04-26 LAB — PSA: PSA: 2.21 ng/mL (ref 0.10–4.00)

## 2016-04-26 LAB — TSH: TSH: 1.2 u[IU]/mL (ref 0.35–4.50)

## 2016-04-26 LAB — BASIC METABOLIC PANEL
BUN: 13 mg/dL (ref 6–23)
CHLORIDE: 107 meq/L (ref 96–112)
CO2: 26 mEq/L (ref 19–32)
Calcium: 9.6 mg/dL (ref 8.4–10.5)
Creatinine, Ser: 0.91 mg/dL (ref 0.40–1.50)
GFR: 87.48 mL/min (ref >60.00)
GLUCOSE: 117 mg/dL — AB (ref 70–99)
POTASSIUM: 4 meq/L (ref 3.5–5.1)
SODIUM: 142 meq/L (ref 135–145)

## 2016-04-26 LAB — AST: AST: 35 U/L (ref 0–37)

## 2016-04-26 LAB — ALT: ALT: 25 U/L (ref 0–53)

## 2016-04-26 NOTE — Progress Notes (Signed)
Pre visit review using our clinic review tool, if applicable. No additional management support is needed unless otherwise documented below in the visit note. 

## 2016-04-26 NOTE — Progress Notes (Signed)
Subjective:    Patient ID: Eric [name], male    DOB: 1945-12-21, 70 y.o.   MRN: [id number]  DOS:  04/26/2016 Type of visit - description : cpx Interval history: No concerns, he has been more physically active lately without any problems. We also addressed chronic issues.   Review of Systems Constitutional: No fever. No chills. No unexplained wt changes. No unusual sweats  HEENT: No dental problems, no ear discharge, no facial swelling, no voice changes. No eye discharge, no eye  redness , no  intolerance to light   Respiratory: No wheezing , no  difficulty breathing. No cough , no mucus production  Cardiovascular: No CP, no leg swelling , no  Palpitations  GI: no nausea, no vomiting, no diarrhea , no  abdominal pain.  No blood in the stools. No dysphagia, no odynophagia    Endocrine: No polyphagia, no polyuria , no polydipsia  GU: No dysuria, gross hematuria, difficulty urinating. No urinary urgency, no frequency.  Musculoskeletal: Occasional muscle cramps, he thinks related to increase physical activity, no "flulike" myalgias   Skin: No change in the color of the skin, palor , no  Rash  Allergic, immunologic: No environmental allergies , no  food allergies  Neurological: No dizziness no  syncope. No headaches. No diplopia, no slurred, no slurred speech, no motor deficits, no facial  Numbness  Hematological: No enlarged lymph nodes, no easy bruising , no unusual bleedings  Psychiatry: No suicidal ideas, no hallucinations, no beavior problems, no confusion.  No unusual/severe anxiety, no depression   Past Medical History:  Diagnosis Date  . Abnormal LFTs   . Aortic stenosis 04/15/2011  . Hyperlipidemia   . Hypertension   . Hypothyroidism   . Macular degeneration    f/u w/ Dr Baird Cancer    Past Surgical History:  Procedure Laterality Date  . CARDIAC CATHETERIZATION  11/10/00  . CHOLECYSTECTOMY    . INGUINAL HERNIA REPAIR     (B)  . TONSILLECTOMY      Social  History   Social History  . Marital status: Married    Spouse name: N/A  . Number of children: 3  . Years of education: N/A   Occupational History  . retired     05/2008   Social History Main Topics  . Smoking status: Former Smoker    Quit date: 04/15/1990  . Smokeless tobacco: Never Used     Comment: used to smoke 2 ppd  . Alcohol use No     Comment: no ETOH since the 90s  . Drug use: No  . Sexual activity: Not on file   Other Topics Concern  . Not on file   Social History Narrative   3 children, 7 g-kids                  Family History  Problem Relation Age of Onset  . Diabetes Mother   . Stroke Mother   . Diabetes Brother     ?  . Coronary artery disease Brother     Male 1st degree relative >50, had CABG in his 61s  . Colon cancer Neg Hx   . Prostate cancer Neg Hx       Medication List       Accurate as of 04/26/16  9:05 AM. Always use your most recent med list.          albuterol 108 (90 Base) MCG/ACT inhaler Commonly known as:  VENTOLIN HFA Inhale 2 puffs into the  lungs every 6 (six) hours as needed for wheezing or shortness of breath.   aspirin 81 MG tablet Take 81 mg by mouth daily.   doxycycline 100 MG tablet Commonly known as:  VIBRA-TABS Take 1 tablet (100 mg total) by mouth 2 (two) times daily.   levothyroxine 88 MCG tablet Commonly known as:  SYNTHROID, LEVOTHROID Take 1 tablet (88 mcg total) by mouth daily before breakfast.   metoprolol 50 MG tablet Commonly known as:  LOPRESSOR Take 1 tablet (50 mg total) by mouth 2 (two) times daily.   NIFEdipine 30 MG 24 hr tablet Commonly known as:  PROCARDIA-XL/ADALAT-CC/NIFEDICAL-XL Take 1 tablet (30 mg total) by mouth daily.   rosuvastatin 20 MG tablet Commonly known as:  CRESTOR Take 1 tablet (20 mg total) by mouth daily.          Objective:   Physical Exam BP 126/76 (BP Location: Left Arm, Patient Position: Sitting, Cuff Size: Normal)   Pulse 70   Temp 97.8 F (36.6 C)  (Oral)   Resp 14   Ht 5\' 8"  (1.727 m)   Wt 193 lb 6 oz (87.7 kg)   SpO2 98%   BMI 29.40 kg/m   General:   Well developed, well nourished . NAD.  Neck: No  thyromegaly  HEENT:  Normocephalic . Face symmetric, atraumatic Lungs:  CTA B Normal respiratory effort, no intercostal retractions, no accessory muscle use. Heart: RRR,  + systolic murmur.  No pretibial edema bilaterally  Abdomen:  Not distended, soft, non-tender. No rebound or rigidity.   Rectal:  External abnormalities: none. Normal sphincter tone. No rectal masses or tenderness.  Stool brown  Prostate: Prostate gland firm and smooth, no enlargement, nodularity, tenderness, mass, asymmetry or induration.  Skin: Exposed areas without rash. Not pale. Not jaundice Neurologic:  alert & oriented X3.  Speech normal, gait appropriate for age and unassisted Strength symmetric and appropriate for age.  Psych: Cognition and judgment appear intact.  Cooperative with normal attention span and concentration.  Behavior appropriate. No anxious or depressed appearing.    Assessment & Plan:   Assessment HTN Hyperlipidemia Hypothyroidism Increase LFTs Ultrasound 2002 done for increased LFTs: Normal liver; Hep C negative 02-16-15 Aortic stenosis dx  2012, last echo 04-2014 AoS stable Macular degeneration +FH CAD Brother age 40s  PLAN HTN: Continue Procardia, Lopressor. BP is controlled Hyperlipidemia: Continue Crestor, checking labs Hypothyroidism: Continue Synthroid Aortic stenosis: Asx, murmur seems stable. Encouraged to call if he ever has chest pain, dizziness, DOB, presyncope. RTC one year

## 2016-04-26 NOTE — Assessment & Plan Note (Addendum)
Td 2012; Pneumonia shot 2013;  prevnar-- 2015;  zostavax--2016; had a flu shot  per patient: Cscope x 2, last 04-2006, has a cscope schedule for May 22 2017  DRE normal today, check a PSA  + FH CAD,currently asx, on aspirin  Labs: BMP, AST, ALT, FLP, TSH, PSA  diet and exercise discussed, he is doing great.  Discuss healthcare power of attorney and fall prevention

## 2016-04-26 NOTE — Patient Instructions (Signed)
Get your blood work before you leave   Get a healthcare power of attorney  Next visit in one year   Fall Prevention and [place] cause injuries and can affect all age groups. It is possible to use preventive measures to significantly decrease the likelihood of falls. There are many simple measures which can make your home safer and prevent falls. OUTDOORS  Repair cracks and edges of walkways and driveways.  Remove high doorway thresholds.  Trim shrubbery on the main path into your home.  Have good outside lighting.  Clear walkways of tools, rocks, debris, and clutter.  Check that handrails are not broken and are securely fastened. Both sides of steps should have handrails.  Have leaves, snow, and ice cleared regularly.  Use sand or salt on walkways during winter months.  In the garage, clean up grease or oil spills. BATHROOM  Install night lights.  Install grab bars by the toilet and in the tub and shower.  Use non-skid mats or decals in the tub or shower.  Place a plastic non-slip stool in the shower to sit on, if needed.  Keep floors dry and clean up all water on the floor immediately.  Remove soap buildup in the tub or shower on a regular basis.  Secure bath mats with non-slip, double-sided rug tape.  Remove throw rugs and tripping hazards from the floors. BEDROOMS  Install night lights.  Make sure a bedside light is easy to reach.  Do not use oversized bedding.  Keep a telephone by your bedside.  Have a firm chair with side arms to use for getting dressed.  Remove throw rugs and tripping hazards from the floor. KITCHEN  Keep handles on pots and pans turned toward the center of the stove. Use back burners when possible.  Clean up spills quickly and allow time for drying.  Avoid walking on wet floors.  Avoid hot utensils and knives.  Position shelves so they are not too high or low.  Place commonly used objects within easy reach.  If  necessary, use a sturdy step stool with a grab bar when reaching.  Keep electrical cables out of the way.  Do not use floor polish or wax that makes floors slippery. If you must use wax, use non-skid floor wax.  Remove throw rugs and tripping hazards from the floor. STAIRWAYS  Never leave objects on stairs.  Place handrails on both sides of stairways and use them. Fix any loose handrails. Make sure handrails on both sides of the stairways are as long as the stairs.  Check carpeting to make sure it is firmly attached along stairs. Make repairs to worn or loose carpet promptly.  Avoid placing throw rugs at the top or bottom of stairways, or properly secure the rug with carpet tape to prevent slippage. Get rid of throw rugs, if possible.  Have an electrician put in a light switch at the top and bottom of the stairs. OTHER FALL PREVENTION TIPS  Wear low-heel or rubber-soled shoes that are supportive and fit well. Wear closed toe shoes.  When using a stepladder, make sure it is fully opened and both spreaders are firmly locked. Do not climb a closed stepladder.  Add color or contrast paint or tape to grab bars and handrails in your home. Place contrasting color strips on first and last steps.  Learn and use mobility aids as needed. Install an electrical emergency response system.  Turn on lights to avoid dark areas. Replace light  bulbs that burn out immediately. Get light switches that glow.  Arrange furniture to create clear pathways. Keep furniture in the same place.  Firmly attach carpet with non-skid or double-sided tape.  Eliminate uneven floor surfaces.  Select a carpet pattern that does not visually hide the edge of steps.  Be aware of all pets. OTHER HOME SAFETY TIPS  Set the water temperature for 120 F (48.8 C).  Keep emergency numbers on or near the telephone.  Keep smoke detectors on every level of the home and near sleeping areas. Document Released: 05/17/2002  Document Revised: 11/26/2011 Document Reviewed: 08/16/2011 [facility] Patient Information 2015 [place], [place]. This information is not intended to replace advice given to you by your health care provider. Make sure you discuss any questions you have with your health care provider.   Preventive Care for Adults Ages 74 and over  Blood pressure check.** / Every 1 to 2 years.  Lipid and cholesterol check.**/ Every 5 years beginning at age 35.  Lung cancer screening. / Every year if you are aged 54-80 years and have a 30-pack-year history of smoking and currently smoke or have quit within the past 15 years. Yearly screening is stopped once you have quit smoking for at least 15 years or develop a health problem that would prevent you from having lung cancer treatment.  Fecal occult blood test (FOBT) of stool. / Every year beginning at age 66 and continuing until age 11. You may not have to do this test if you get a colonoscopy every 10 years.  Flexible sigmoidoscopy** or colonoscopy.** / Every 5 years for a flexible sigmoidoscopy or every 10 years for a colonoscopy beginning at age 58 and continuing until age 3.  Hepatitis C blood test.** / For all people born from [birth year] through 1965 and any individual with known risks for hepatitis C.  Abdominal aortic aneurysm (AAA) screening.** / A one-time screening for ages 62 to 12 years who are current or former smokers.  Skin self-exam. / Monthly.  Influenza vaccine. / Every year.  Tetanus, diphtheria, and acellular pertussis (Tdap/Td) vaccine.** / 1 dose of Td every 10 years.  Varicella vaccine.** / Consult your health care provider.  Zoster vaccine.** / 1 dose for adults aged 87 years or older.  Pneumococcal 13-valent conjugate (PCV13) vaccine.** / Consult your health care provider.  Pneumococcal polysaccharide (PPSV23) vaccine.** / 1 dose for all adults aged 68 years and older.  Meningococcal vaccine.** / Consult your health care  provider.  Hepatitis A vaccine.** / Consult your health care provider.  Hepatitis B vaccine.** / Consult your health care provider.  Haemophilus influenzae type b (Hib) vaccine.** / Consult your health care provider. **Family history and personal history of risk and conditions may change your health care provider's recommendations. Document Released: 07/23/2001 Document Revised: 06/01/2013 Document Reviewed: 10/22/2010 [facility] Patient Information 2015 [place], [place]. This information is not intended to replace advice given to you by your health care provider. Make sure you discuss any questions you have with your health care provider.

## 2016-04-28 NOTE — Assessment & Plan Note (Signed)
HTN: Continue Procardia, Lopressor. BP is controlled Hyperlipidemia: Continue Crestor, checking labs Hypothyroidism: Continue Synthroid Aortic stenosis: Asx, murmur seems stable. Encouraged to call if he ever has chest pain, dizziness, DOB, presyncope. RTC one year

## 2016-04-30 ENCOUNTER — Telehealth: Payer: Self-pay | Admitting: Internal Medicine

## 2016-04-30 MED ORDER — LEVOTHYROXINE SODIUM 88 MCG PO TABS: 88.0000 ug | ORAL_TABLET | Freq: Every day | ORAL | 12 refills | Status: DC

## 2016-04-30 MED ORDER — NIFEDIPINE ER OSMOTIC RELEASE 30 MG PO TB24: 30.0000 mg | ORAL_TABLET | Freq: Every day | ORAL | 12 refills | Status: DC

## 2016-04-30 MED ORDER — METOPROLOL TARTRATE 50 MG PO TABS: 50.0000 mg | ORAL_TABLET | Freq: Two times a day (BID) | ORAL | 12 refills | Status: DC

## 2016-04-30 MED ORDER — ROSUVASTATIN CALCIUM 20 MG PO TABS: 20.0000 mg | ORAL_TABLET | Freq: Every day | ORAL | 12 refills | Status: DC

## 2016-04-30 NOTE — Addendum Note (Signed)
Addended by: Damita Dunnings D on: 04/30/2016 11:51 AM   Modules accepted: Orders

## 2016-04-30 NOTE — Telephone Encounter (Signed)
Rxs sent

## 2016-04-30 NOTE — Telephone Encounter (Signed)
Caller name: Tysen Relationship to patient: self Can be reached: [phone] Pharmacy:[facility] #[id number] - [place], [place]  Reason for call: Pt requesting 1 year refills in 30 day supply be sent in for his meds: rosuvastatin, nefedipine, metoprolol, and levothyroxine.

## 2016-05-06 ENCOUNTER — Ambulatory Visit (AMBULATORY_SURGERY_CENTER): Payer: Self-pay | Admitting: *Deleted

## 2016-05-06 VITALS — Ht 68.0 in | Wt 194.6 lb

## 2016-05-06 DIAGNOSIS — Z86010 Personal history of colonic polyps: Secondary | ICD-10-CM

## 2016-05-06 MED ORDER — NA SULFATE-K SULFATE-MG SULF 17.5-3.13-1.6 GM/177ML PO SOLN: 1.0000 | Freq: Once | ORAL | 0 refills | Status: AC

## 2016-05-06 NOTE — Progress Notes (Signed)
No egg or soy allergy known to patient  No issues with past sedation with any surgeries  or procedures, no intubation problems  No diet pills per patient No home 02 use per patient  No blood thinners per patient  Pt denies issues with constipation  No A fib or A flutter   emmi declined'   

## 2016-05-09 ENCOUNTER — Encounter: Payer: Self-pay | Admitting: Gastroenterology

## 2016-05-21 ENCOUNTER — Other Ambulatory Visit: Payer: Self-pay | Admitting: Internal Medicine

## 2016-05-22 ENCOUNTER — Encounter: Payer: Self-pay | Admitting: Gastroenterology

## 2016-05-22 ENCOUNTER — Ambulatory Visit (AMBULATORY_SURGERY_CENTER): Payer: Medicare Other | Admitting: Gastroenterology

## 2016-05-22 VITALS — BP 130/71 | HR 67 | Temp 98.6°F | Resp 12 | Ht 68.0 in | Wt 194.0 lb

## 2016-05-22 DIAGNOSIS — Z1211 Encounter for screening for malignant neoplasm of colon: Secondary | ICD-10-CM

## 2016-05-22 DIAGNOSIS — Z1212 Encounter for screening for malignant neoplasm of rectum: Secondary | ICD-10-CM

## 2016-05-22 DIAGNOSIS — D123 Benign neoplasm of transverse colon: Secondary | ICD-10-CM | POA: Diagnosis not present

## 2016-05-22 DIAGNOSIS — Z86010 Personal history of colonic polyps: Secondary | ICD-10-CM

## 2016-05-22 DIAGNOSIS — D124 Benign neoplasm of descending colon: Secondary | ICD-10-CM

## 2016-05-22 MED ORDER — SODIUM CHLORIDE 0.9 % IV SOLN: 500.0000 mL | INTRAVENOUS | Status: DC

## 2016-05-22 NOTE — Op Note (Signed)
[facility] Patient Name: Eric [name] Procedure Date: 05/22/2016 1:57 PM MRN: [id number] Endoscopist: Mallie Mussel L. Loletha Carrow , MD Age: 70 Referring MD:  Date of Birth: Jun 25, 1945 Gender: Male Account #: [id number] Procedure:                Colonoscopy Indications:              Screening for colorectal malignant neoplasm (no                            polyps on colonoscopy 04/2006) Medicines:                Monitored Anesthesia Care Procedure:                Pre-Anesthesia Assessment:                           - Prior to the procedure, a History and Physical                            was performed, and patient medications and                            allergies were reviewed. The patient's tolerance of                            previous anesthesia was also reviewed. The risks                            and benefits of the procedure and the sedation                            options and risks were discussed with the patient.                            All questions were answered, and informed consent                            was obtained. Anticoagulants: The patient has taken                            aspirin. It was decided not to withhold this                            medication prior to the procedure. ASA Grade                            Assessment: III - A patient with severe systemic                            disease. After reviewing the risks and benefits,                            the patient was deemed in satisfactory condition to  undergo the procedure.                           After obtaining informed consent, the colonoscope                            was passed under direct vision. Throughout the                            procedure, the patient's blood pressure, pulse, and                            oxygen saturations were monitored continuously. The                            Model CF-HQ190L [id number]) scope was introduced                          through the anus and advanced to the the cecum,                            identified by appendiceal orifice and ileocecal                            valve. The colonoscopy was performed without                            difficulty. The patient tolerated the procedure                            well. The quality of the bowel preparation was                            excellent. The ileocecal valve, appendiceal                            orifice, and rectum were photographed. The bowel                            preparation used was SUPREP. Scope In: 2:05:54 PM Scope Out: 2:19:35 PM Scope Withdrawal Time: 0 hours 10 minutes 8 seconds  Total Procedure Duration: 0 hours 13 minutes 41 seconds  Findings:                 The perianal and digital rectal examinations were                            normal.                           Four sessile polyps were found in the proximal                            descending colon, mid transverse colon and distal  transverse colon. The polyps were 4 to 6 mm in                            size. These polyps were removed with a cold snare.                            Resection and retrieval were complete.                           Multiple medium-mouthed diverticula were found from                            transverse colon to rectum.                           Internal hemorrhoids were found during retroflexion                            and during anoscopy. The hemorrhoids were large and                            Grade I (internal hemorrhoids that do not prolapse).                           The exam was otherwise without abnormality. Complications:            No immediate complications. Estimated Blood Loss:     Estimated blood loss: none. Impression:               - Four 4 to 6 mm polyps in the proximal descending                            colon, in the mid transverse colon and in the                             distal transverse colon, removed with a cold snare.                            Resected and retrieved.                           - Diverticulosis from transverse colon to rectum.                           - Internal hemorrhoids.                           - The examination was otherwise normal. Recommendation:           - Patient has a contact number available for                            emergencies. The signs and symptoms of potential  delayed complications were discussed with the                            patient. Return to normal activities tomorrow.                            Written discharge instructions were provided to the                            patient.                           - Resume previous diet.                           - No aspirin, ibuprofen, naproxen, or other                            non-steroidal anti-inflammatory drugs for 3 days                            after polyp removal.                           - Await pathology results.                           - Repeat colonoscopy is recommended for                            surveillance. The colonoscopy date will be                            determined after pathology results from today's                            exam become available for review. Henry L. Loletha Carrow, MD 05/22/2016 2:24:24 PM This report has been signed electronically.

## 2016-05-22 NOTE — Progress Notes (Signed)
Called to room to assist during endoscopic procedure.  Patient ID and intended procedure confirmed with present staff. Received instructions for my participation in the procedure from the performing physician.  

## 2016-05-22 NOTE — Progress Notes (Signed)
Spontaneous respirations throughout. VSS. Resting comfortably. To PACU on room air. Report to  RN. 

## 2016-05-22 NOTE — Patient Instructions (Signed)
YOU HAD AN ENDOSCOPIC PROCEDURE TODAY AT [facility]:   Refer to the procedure report that was given to you for any specific questions about what was found during the examination.  If the procedure report does not answer your questions, please call your gastroenterologist to clarify.  If you requested that your care partner not be given the details of your procedure findings, then the procedure report has been included in a sealed envelope for you to review at your convenience later.  YOU SHOULD EXPECT: Some feelings of bloating in the abdomen. Passage of more gas than usual.  Walking can help get rid of the air that was put into your GI tract during the procedure and reduce the bloating. If you had a lower endoscopy (such as a colonoscopy or flexible sigmoidoscopy) you may notice spotting of blood in your stool or on the toilet paper. If you underwent a bowel prep for your procedure, you may not have a normal bowel movement for a few days.  Please Note:  You might notice some irritation and congestion in your nose or some drainage.  This is from the oxygen used during your procedure.  There is no need for concern and it should clear up in a day or so.  SYMPTOMS TO REPORT IMMEDIATELY:   Following lower endoscopy (colonoscopy or flexible sigmoidoscopy):  Excessive amounts of blood in the stool  Significant tenderness or worsening of abdominal pains  Swelling of the abdomen that is new, acute  Fever of 100F or higher    For urgent or emergent issues, a gastroenterologist can be reached at any hour by calling [phone].   DIET:  We do recommend a small meal at first, but then you may proceed to your regular diet.  Drink plenty of fluids but you should avoid alcoholic beverages for 24 hours.  ACTIVITY:  You should plan to take it easy for the rest of today and you should NOT DRIVE or use heavy machinery until tomorrow (because of the sedation medicines used during the test).     FOLLOW UP: Our staff will call the number listed on your records the next business day following your procedure to check on you and address any questions or concerns that you may have regarding the information given to you following your procedure. If we do not reach you, we will leave a message.  However, if you are feeling well and you are not experiencing any problems, there is no need to return our call.  We will assume that you have returned to your regular daily activities without incident.  If any biopsies were taken you will be contacted by phone or by letter within the next 1-3 weeks.  Please call us at [phone] if you have not heard about the biopsies in 3 weeks.    SIGNATURES/CONFIDENTIALITY: You and/or your care partner have signed paperwork which will be entered into your electronic medical record.  These signatures attest to the fact that that the information above on your After Visit Summary has been reviewed and is understood.  Full responsibility of the confidentiality of this discharge information lies with you and/or your care-partner.   No Aspirin,Ibuprofen,naproxen,or other non-steroidal anti-inflammatory drugs for 3 days resume remainder of medications. Information given on polyps, diverticulosis and hemorrhoids.

## 2016-05-23 ENCOUNTER — Telehealth: Payer: Self-pay

## 2016-05-23 NOTE — Telephone Encounter (Signed)
  Follow up Call-  Call back number 05/22/2016  Post procedure Call Back phone  # [phone]  Permission to leave phone message Yes  Some recent data might be hidden     Patient questions:  Do you have a fever, pain , or abdominal swelling? No. Pain Score  0 *  Have you tolerated food without any problems? Yes.    Have you been able to return to your normal activities? Yes.    Do you have any questions about your discharge instructions: Diet   No. Medications  No. Follow up visit  No.  Do you have questions or concerns about your Care? No.  Actions: * If pain score is 4 or above: No action needed, pain <4.

## 2016-05-28 ENCOUNTER — Other Ambulatory Visit: Payer: Self-pay | Admitting: Internal Medicine

## 2016-05-29 ENCOUNTER — Encounter: Payer: Self-pay | Admitting: Gastroenterology

## 2016-06-10 HISTORY — PX: EYE SURGERY: SHX253

## 2016-06-10 HISTORY — PX: CATARACT EXTRACTION W/ INTRAOCULAR LENS IMPLANT: SHX1309

## 2016-07-15 ENCOUNTER — Telehealth: Payer: Self-pay | Admitting: *Deleted

## 2016-07-15 NOTE — Telephone Encounter (Signed)
Declines

## 2016-09-23 ENCOUNTER — Telehealth: Payer: Self-pay | Admitting: Internal Medicine

## 2016-09-23 NOTE — Telephone Encounter (Signed)
Noted  

## 2016-09-23 NOTE — Telephone Encounter (Signed)
Patient called stating he is experiencing elevated BP's. Taken twice this morning, reading 161/85 and 156/81. Also is having hard time breathing. Transferred to [facility]

## 2016-09-23 NOTE — Telephone Encounter (Signed)
[facility] - Client [place] [facility] Patient Name: Eric [name] DOB: 1946-04-13 Initial Comment Caller states been having trouble breathing, bp 161/85 at 10 this am Nurse Assessment Nurse: Chesley Noon, RN, Lattie Haw Date/Time (Eastern Time): 09/23/2016 10:20:58 AM Confirm and document reason for call. If symptomatic, describe symptoms. ---Caller states he had some difficulty breathing on Saturday night that has resolved, but his blood pressure has been up and down. Last BP was 161/85. He does take blood pressure medication. He does not have SOB at this time or other symptoms he is concerned about. Does the patient have any new or worsening symptoms? ---Yes Will a triage be completed? ---Yes Related visit to physician within the last 2 weeks? ---No Does the PT have any chronic conditions? (i.e. diabetes, asthma, etc.) ---Yes List chronic conditions. ---hypertension Is this a behavioral health or substance abuse call? ---No Guidelines Guideline Title Affirmed Question Affirmed Notes High Blood Pressure Systolic BP >= 790 OR Diastolic >= 383 Final Disposition User See PCP When Office is Open (within 3 days) Chesley Noon, RN, Lattie Haw Comments Caller notified of appt time for tomorrow at 0800 with Mackie Pai, he states he will be there. Referrals REFERRED TO PCP OFFICE Disagree/Comply: Comply

## 2016-09-24 ENCOUNTER — Ambulatory Visit (HOSPITAL_BASED_OUTPATIENT_CLINIC_OR_DEPARTMENT_OTHER)
Admission: RE | Admit: 2016-09-24 | Discharge: 2016-09-24 | Disposition: A | Discharge status: Home / Self Care | Payer: Medicare Other | Source: Ambulatory Visit | Attending: Medical | Admitting: Medical

## 2016-09-24 ENCOUNTER — Encounter: Payer: Self-pay | Admitting: Medical

## 2016-09-24 ENCOUNTER — Ambulatory Visit (INDEPENDENT_AMBULATORY_CARE_PROVIDER_SITE_OTHER): Payer: Medicare Other | Admitting: Medical

## 2016-09-24 VITALS — BP 150/84 | HR 61 | Temp 98.5°F | Resp 16 | Ht 68.0 in | Wt 194.8 lb

## 2016-09-24 DIAGNOSIS — E785 Hyperlipidemia, unspecified: Secondary | ICD-10-CM

## 2016-09-24 DIAGNOSIS — R0600 Dyspnea, unspecified: Secondary | ICD-10-CM

## 2016-09-24 DIAGNOSIS — I10 Essential (primary) hypertension: Secondary | ICD-10-CM

## 2016-09-24 LAB — COMPREHENSIVE METABOLIC PANEL
ALT: 24 U/L (ref 0–53)
AST: 32 U/L (ref 0–37)
Albumin: 4.6 g/dL (ref 3.5–5.2)
Alkaline Phosphatase: 62 U/L (ref 39–117)
BUN: 12 mg/dL (ref 6–23)
CHLORIDE: 105 meq/L (ref 96–112)
CO2: 28 meq/L (ref 19–32)
Calcium: 9.6 mg/dL (ref 8.4–10.5)
Creatinine, Ser: 0.95 mg/dL (ref 0.40–1.50)
GFR: 83.14 mL/min (ref >60.00)
GLUCOSE: 110 mg/dL — AB (ref 70–99)
POTASSIUM: 3.8 meq/L (ref 3.5–5.1)
Sodium: 139 mEq/L (ref 135–145)
Total Bilirubin: 0.9 mg/dL (ref 0.2–1.2)
Total Protein: 7.8 g/dL (ref 6.0–8.3)

## 2016-09-24 LAB — CBC WITH DIFFERENTIAL/PLATELET
BASOS ABS: 0 10*3/uL (ref 0.0–0.1)
Basophils Relative: 0.4 % (ref 0.0–3.0)
EOS ABS: 0 10*3/uL (ref 0.0–0.7)
Eosinophils Relative: 0.7 % (ref 0.0–5.0)
HCT: 45.7 % (ref 39.0–52.0)
HEMOGLOBIN: 15.2 g/dL (ref 13.0–17.0)
Lymphocytes Relative: 21.9 % (ref 12.0–46.0)
Lymphs Abs: 1.2 10*3/uL (ref 0.7–4.0)
MCHC: 33.3 g/dL (ref 30.0–36.0)
MCV: 92.9 fl (ref 78.0–100.0)
MONO ABS: 0.6 10*3/uL (ref 0.1–1.0)
MONOS PCT: 10.6 % (ref 3.0–12.0)
NEUTROS PCT: 66.4 % (ref 43.0–77.0)
Neutro Abs: 3.8 10*3/uL (ref 1.4–7.7)
PLATELETS: 230 10*3/uL (ref 150.0–400.0)
RBC: 4.92 Mil/uL (ref 4.22–5.81)
RDW: 13.1 % (ref 11.5–15.5)
WBC: 5.7 10*3/uL (ref 4.0–10.5)

## 2016-09-24 LAB — LIPID PANEL
Cholesterol: 157 mg/dL (ref 0–200)
HDL: 49.5 mg/dL (ref >39.00)
LDL Cholesterol: 76 mg/dL (ref 0–99)
NONHDL: 107.75
Total CHOL/HDL Ratio: 3
Triglycerides: 157 mg/dL — ABNORMAL HIGH (ref 0.0–149.0)
VLDL: 31.4 mg/dL (ref 0.0–40.0)

## 2016-09-24 LAB — TROPONIN I: TNIDX: 0.01 ug/l (ref 0.00–0.06)

## 2016-09-24 LAB — BRAIN NATRIURETIC PEPTIDE: Pro B Natriuretic peptide (BNP): 96 pg/mL (ref 0.0–100.0)

## 2016-09-24 MED ORDER — LOSARTAN POTASSIUM 50 MG PO TABS: 50.0000 mg | ORAL_TABLET | Freq: Every day | ORAL | 0 refills | Status: DC

## 2016-09-24 NOTE — Progress Notes (Signed)
Subjective:    Patient ID: Eric [name], male    DOB: [birth year], 71 y.o.   MRN: [id number]  HPI   Pt in for some recent bp fluctuations. Most of systolic readings in 176-160 range. But high was 173/85. His low was 140/80. Total readings 14 readings. No obcious cardiac or neurologic type signs or symptoms  Pt states Saturday night he layed down and felt like he could not get a breath(he felt claustrophobic). When he stood up and walked around felt fine. When he partially reclined felt fine. Then later layed flat on his back and he felt fine. Onset of symptoms did not start with falling asleep but rather when he intialy layed down to sleep..    Pt denies any heart burn. Denies any chest pain. No fever.No cough and no wheezing. No gross motor or sensory function deficits.   Remote hx of smoking but stopped smoking at 40 years.  Symptoms of sob lastedd 5-15 minutes at best then resolved completely.  Review of Systems  Constitutional: Negative for chills, fatigue and fever.  HENT: Negative for congestion, ear pain, sinus pain, sinus pressure and sore throat.   Respiratory: Positive for shortness of breath. Negative for cough and wheezing.        None presently.  Cardiovascular: Negative for chest pain, palpitations and leg swelling.  Gastrointestinal: Negative for abdominal pain and blood in stool.  Musculoskeletal: Negative for back pain and neck stiffness.  Skin: Negative for rash.  Neurological: Negative for dizziness, speech difficulty, weakness, numbness and headaches.  Hematological: Negative for adenopathy. Does not bruise/bleed easily.  Psychiatric/Behavioral: Negative for behavioral problems, confusion, dysphoric mood, sleep disturbance and suicidal ideas.    Past Medical History:  Diagnosis Date  . Abnormal LFTs   . Aortic stenosis 04/15/2011  . Hyperlipidemia   . Hypertension   . Hypothyroidism   . Macular edema    Dr  Sherlynn Stalls      Social History   Social  History  . Marital status: Married    Spouse name: N/A  . Number of children: 3  . Years of education: N/A   Occupational History  . 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 Smoking status: Former Smoker    Quit date: 04/15/1990  . Smokeless tobacco: Never Used     Comment: used to smoke 2 ppd  . Alcohol use No     Comment: no ETOH since the 90s  . Drug use: No  . Sexual activity: Not on file   Other Topics Concern  . Not on file   Social History Narrative   3 children, 7 g-kids                 Past Surgical History:  Procedure Laterality Date  . CARDIAC CATHETERIZATION  11/10/00  . CHOLECYSTECTOMY    . INGUINAL HERNIA REPAIR     (B)  . POLYPECTOMY    . TONSILLECTOMY      Family History  Problem Relation Age of Onset  . Diabetes Mother   . Stroke Mother   . Diabetes Brother     ?  . Coronary artery disease Brother     Male 1st degree relative >50, had CABG in his 36s  . Colon cancer Neg Hx   . Prostate cancer Neg Hx   . Colon polyps Neg Hx   . Esophageal cancer Neg Hx   . Rectal  cancer Neg Hx   . Stomach cancer Neg Hx     Allergies  Allergen Reactions  . Atorvastatin     REACTION: myalgia  . Ezetimibe     REACTION: myalgia    Current Outpatient Prescriptions on File Prior to Visit  Medication Sig Dispense Refill  . aspirin 81 MG tablet Take 81 mg by mouth daily.      Marland Kitchen levothyroxine (SYNTHROID, LEVOTHROID) 88 MCG tablet Take 1 tablet (88 mcg total) by mouth daily before breakfast. 30 tablet 12  . metoprolol (LOPRESSOR) 50 MG tablet Take 1 tablet (50 mg total) by mouth 2 (two) times daily. 60 tablet 12  . NIFEdipine (PROCARDIA-XL/ADALAT-CC/NIFEDICAL-XL) 30 MG 24 hr tablet Take 1 tablet (30 mg total) by mouth daily. 30 tablet 12  . rosuvastatin (CRESTOR) 20 MG tablet Take 1 tablet (20 mg total) by mouth daily. 30 tablet 12   Current Facility-Administered Medications on File Prior to Visit  Medication Dose Route  Frequency Provider Last Rate Last Dose  . 0.9 %  sodium chloride infusion  500 mL Intravenous Continuous Nelida Meuse III, MD        BP (!) 154/68 (BP Location: Right Arm, Patient Position: Sitting, Cuff Size: Large)   Pulse 61   Temp 98.5 F (36.9 C) (Oral)   Resp 16   Ht 5\' 8"  (1.727 m)   Wt 194 lb 12.8 oz (88.4 kg)   SpO2 96%   BMI 29.62 kg/m       Objective:   Physical Exam   General- No acute distress. Pleasant patient. Neck- Full range of motion, no jvd Lungs- Clear, even and unlabored. Heart- regular rate and rhythm. Neurologic- CNII- XII grossly intact.  Abdomen- soft, nt, nd, +bs. No rebound or guarding. Back- no cva tenderness. Lower ext- no pedal edema. Negative homans signs. Calfs symmetric.      Assessment & Plan:  Sinus bradycardia. v2 lead looked different than prior/artifact like due to fact lead would not stick on chest. On repeat  ekg v2 looked normal.(no ischemic change)  For recent htn/higher level than normal we need to give other bp med. The trend recently is too high. Rx losartan to use one tab a day.  Check bp daily and bring in reading. Follow up for bp check in 10 days.  For recent dyspnea that was transient we need to get cbc, cmp, bnp, troponin and cxr.  If you get severe sob episode again then recommend ED evaluation. Other test more extensive may need to be done in that event.  Follow up in 10 days or as needed  Pt declined rx of albuterol inhaler. Remote hx of smoking many years ago per pt.   Eric [name], Eric Miller, PA-C

## 2016-09-24 NOTE — Progress Notes (Signed)
Pre visit review using our clinic review tool, if applicable. No additional management support is needed unless otherwise documented below in the visit note. 

## 2016-09-24 NOTE — Patient Instructions (Addendum)
For recent htn/higher level than normal we need to give other bp med. The trend recently is too high. Rx losartan to use one tab a day.  Check bp daily and bring in reading. Follow up for bp check in 10 days.  For recent dyspnea that was transient we need to get cbc, cmp, bnp, troponin and cxr.  If you get severe sob episode again then recommend ED evaluation. Other test more extensive may need to be done in that event.  Follow up in 10 days or as needed

## 2016-09-26 ENCOUNTER — Telehealth: Payer: Self-pay | Admitting: *Deleted

## 2016-09-26 NOTE — Telephone Encounter (Signed)
-----   Message from Mackie Pai, PA-C sent at 09/25/2016 10:49 PM EDT ----- On lab review shows normal kidney function. Mild sugar elevation by 11 points . Cholesterol looks good. Only very mild elevated triglycerides. Heart failure protein is negative/very low/normal. No anemia and normal infection fighting cells. No explanation found for pt transient shortness of breath the other day. How are doing. Any further episodes.

## 2016-09-26 NOTE — Telephone Encounter (Signed)
Patient notified of results.  He is feeling a lot better.  No stumbling and no dizziness.  He wanted to update [place] on his bp.  This morning it was 145/80.  A few readings from other mornings 159/77 and 160/83.  He has been keeping a log.  He stated that it has been going down since he has started the new medication.

## 2016-09-27 NOTE — Telephone Encounter (Signed)
Sounds like on right track. Needs about a week to see full effect. Call pt on Monday and see what weekend bp readings were?

## 2016-10-03 NOTE — Progress Notes (Addendum)
Subjective:   Eric [name] is a 71 y.o. male who presents for Medicare Annual/Subsequent preventive examination.  Review of Systems:  No ROS.  Medicare Wellness Visit.  Cardiac Risk Factors include: advanced age (>53men, >58 women);hypertension;male gender;dyslipidemia  Sleep patterns: gets up 0-1 times nightly to void and sleeps 7 hours nightly. Has occasional restless nights, but overall no sleep issues.  Home Safety/Smoke Alarms: Feels safe in home. Smoke alarms in place.   Living environment; residence and Firearm Safety: Lives w/ wife. 1-story house/ trailer, firearms stored safely. Seat Belt Safety/Bike Helmet: Wears seat belt.   Counseling:   Eye Exam- Dr. Dorise Bullion specialist, and Dr. Eduard Clos Burns-optometrist Dental- Full dentures, does not follow w/ dentist regularly.  Male:   CCS- last 05/22/16. 4 polyps removed, diverticulosis, internal hemorrhoids. 3 year recall.     PSA-  Lab Results  Component Value Date   PSA 2.21 04/26/2016   PSA 1.41 04/19/2013   PSA 1.34 04/15/2011         Objective:    Vitals: BP (!) 144/91 (BP Location: Left Arm, Patient Position: Sitting, Cuff Size: Normal)   Pulse 69   Temp 98.4 F (36.9 C) (Oral)   Resp 16   Ht 5\' 8"  (1.727 m)   Wt 197 lb 12.8 oz (89.7 kg)   SpO2 96%   BMI 30.08 kg/m   Body mass index is 30.08 kg/m.  Tobacco History  Smoking Status  . Former Smoker  . Quit date: 04/15/1990  Smokeless Tobacco  . Never Used    Comment: used to smoke 2 ppd     Counseling given: Not Answered   Past Medical History:  Diagnosis Date  . Abnormal LFTs   . Aortic stenosis 04/15/2011  . Hyperlipidemia   . Hypertension   . Hypothyroidism   . Macular edema    Dr  Sherlynn Stalls    Past Surgical History:  Procedure Laterality Date  . CARDIAC CATHETERIZATION  11/10/00  . CHOLECYSTECTOMY    . INGUINAL HERNIA REPAIR     (B)  . POLYPECTOMY    . TONSILLECTOMY     Family History  Problem Relation Age of Onset  .  Diabetes Mother   . Stroke Mother   . Diabetes Brother     ?  . Coronary artery disease Brother     Male 1st degree relative >50, had CABG in his 6s  . Colon cancer Neg Hx   . Prostate cancer Neg Hx   . Colon polyps Neg Hx   . Esophageal cancer Neg Hx   . Rectal cancer Neg Hx   . Stomach cancer Neg Hx    History  Sexual Activity  . Sexual activity: Not on file    Outpatient Encounter Prescriptions as of 10/04/2016  Medication Sig  . aspirin 81 MG tablet Take 81 mg by mouth daily.    Marland Kitchen levothyroxine (SYNTHROID, LEVOTHROID) 88 MCG tablet Take 1 tablet (88 mcg total) by mouth daily before breakfast.  . metoprolol (LOPRESSOR) 50 MG tablet Take 1 tablet (50 mg total) by mouth 2 (two) times daily.  Marland Kitchen NIFEdipine (PROCARDIA-XL/ADALAT-CC/NIFEDICAL-XL) 30 MG 24 hr tablet Take 1 tablet (30 mg total) by mouth daily.  . rosuvastatin (CRESTOR) 20 MG tablet Take 1 tablet (20 mg total) by mouth daily.  . [DISCONTINUED] losartan (COZAAR) 50 MG tablet Take 1 tablet (50 mg total) by mouth daily.  Marland Kitchen losartan (COZAAR) 100 MG tablet Take 1 tablet (100 mg total) by mouth daily.  . [  DISCONTINUED] 0.9 %  sodium chloride infusion    No facility-administered encounter medications on file as of 10/04/2016.     Activities of Daily Living In your present state of health, do you have any difficulty performing the following activities: 10/04/2016 04/26/2016  Hearing? N N  Vision? N N  Difficulty concentrating or making decisions? N N  Walking or climbing stairs? N N  Dressing or bathing? N N  Doing errands, shopping? N N  Preparing Food and eating ? N -  Using the Toilet? N -  Do you have problems with loss of bowel control? N -  Managing your Medications? N -  Managing your Finances? N -  Housekeeping or managing your Housekeeping? N -  Some recent data might be hidden    Patient Care Team: Colon Branch, MD as PCP - General Sherlynn Stalls, MD as Consulting Physician (Ophthalmology)   Assessment:      Physical assessment deferred to PCP.  Exercise Activities and Dietary recommendations Current Exercise Habits: The patient does not participate in regular exercise at present, Exercise limited by: None identified. During the warmer months he stays active doing yard work. He also tries to park far away when running errands as a means of increasing his activity. He knows about Silver Sneakers benefits but states that type of exercise 'doesn't work for me.'  Diet (meal preparation, eat out, water intake, caffeinated beverages, dairy products, fruits and vegetables): in general, a "healthy" diet  , well balanced, on average, 2-3 meals per day, low salt. Eats out frequently and usually tries to split meal in half and take the rest to go. Has been working on decreasing sweets and overall cleaning up his diet-more fresh fruits and vegetables and more water. He usually eats breakfast, a small/snack lunch, and a larger meal for supper. He is trying to watch portion sizes of meat.   Goals    . Increase physical activity      Fall Risk Fall Risk  10/04/2016 04/26/2016 04/27/2015 04/21/2014 04/19/2013  Falls in the past year? No No No No No   Depression Screen PHQ 2/9 Scores 10/04/2016 04/26/2016 04/27/2015 04/21/2014  PHQ - 2 Score 1 0 0 0    Cognitive Function MMSE - Mini Mental State Exam 10/04/2016  Orientation to time 5  Orientation to Place 5  Registration 3  Attention/ Calculation 5  Recall 2  Language- name 2 objects 2  Language- repeat 1  Language- follow 3 step command 3  Language- read & follow direction 1  Write a sentence 1  Copy design 1  Total score 29        Immunization History  Administered Date(s) Administered  . Influenza Split 03/11/2013, 02/17/2014  . Influenza Whole 04/13/2007, 03/15/2008, 03/10/2012  . Influenza, High Dose Seasonal PF 04/04/2015, 03/28/2016  . Pneumococcal Conjugate-13 04/21/2014  . Pneumococcal Polysaccharide-23 04/15/2012  . Td 06/10/1996,  06/10/2000  . Tdap 04/15/2011  . Zoster 03/29/2015   Screening Tests Health Maintenance  Topic Date Due  . INFLUENZA VACCINE  01/08/2017  . COLONOSCOPY  05/23/2019  . TETANUS/TDAP  04/14/2021  . Hepatitis C Screening  Completed  . PNA vac Low Risk Adult  Completed      Plan:    Follow-up w/ PCP as directed.  Bring a copy of your advance directives to your next office visit.  Eat heart healthy diet (full of fruits, vegetables, whole grains, lean protein, water--limit salt, fat, and sugar intake) and increase physical activity as  tolerated.  I have personally reviewed and noted the following in the patient's chart:   . Medical and social history . Use of alcohol, tobacco or illicit drugs  . Current medications and supplements . Functional ability and status . Nutritional status . Physical activity . Advanced directives . List of other physicians . Vitals . Screenings to include cognitive, depression, and falls . Referrals and appointments  In addition, I have reviewed and discussed with patient certain preventive protocols, quality metrics, and best practice recommendations. A written personalized care plan for preventive services was provided to patient.     Dorrene German, RN  10/04/2016  Kathlene November, MD   Pt has upcoming wellness exam today. Pt bp have improved a little bit with mild decrease in diastolic. Sytolic did not come down much. Pt started on losartan 50 mg a day. Pt bp reading today 141/91. His bp readings high systolic 160-109  range.  Will increase losartan to 100 mg. He states mild dry cough. Maybe allergy. Doubt med cough. Can use claritin otc. If cough lingers or worsen consider stopping losartan. Cough more concern with ACE.  I have reviewed and agree with  RN assessment and plan today.  Very quick assessment of pt bp today before I sent him over for wellness. Decided not to charge separate visit. Just welllness charge.

## 2016-10-04 ENCOUNTER — Encounter: Payer: Self-pay | Admitting: Medical

## 2016-10-04 ENCOUNTER — Ambulatory Visit (INDEPENDENT_AMBULATORY_CARE_PROVIDER_SITE_OTHER): Payer: Medicare Other | Admitting: Medical

## 2016-10-04 VITALS — BP 144/91 | HR 69 | Temp 98.4°F | Resp 16 | Ht 68.0 in | Wt 197.8 lb

## 2016-10-04 DIAGNOSIS — Z0000 Encounter for general adult medical examination without abnormal findings: Secondary | ICD-10-CM | POA: Diagnosis not present

## 2016-10-04 DIAGNOSIS — I10 Essential (primary) hypertension: Secondary | ICD-10-CM

## 2016-10-04 MED ORDER — LOSARTAN POTASSIUM 100 MG PO TABS: 100.0000 mg | ORAL_TABLET | Freq: Every day | ORAL | 0 refills | Status: DC

## 2016-10-04 NOTE — Patient Instructions (Addendum)
  Eric [name] , Thank you for taking time to come for your Medicare Wellness Visit. I appreciate your ongoing commitment to your health goals. Please review the following plan we discussed and let me know if I can assist you in the future.   Bring a copy of your advance directives to your next office visit.  Per [name]: Continue checking your blood pressure at home daily and send the reading via MyChart in 10 days.  These are the goals we discussed: Goals    . Increase physical activity       This is a list of the screening recommended for you and due dates:  Health Maintenance  Topic Date Due  . Flu Shot  01/08/2017  . Colon Cancer Screening  05/23/2019  . Tetanus Vaccine  04/14/2021  .  Hepatitis C: One time screening is recommended by Center for Disease Control  (CDC) for  adults born from [birth year] through 1965.   Completed  . Pneumonia vaccines  Completed

## 2016-10-18 ENCOUNTER — Telehealth: Payer: Self-pay | Admitting: Internal Medicine

## 2016-10-18 ENCOUNTER — Encounter: Payer: Self-pay | Admitting: Medical

## 2016-10-18 NOTE — Telephone Encounter (Signed)
Patient sent a message, BPs are very variable. I haven't seen him since November, recommend to write down his readings and come back for a checkup next week. ER if BP is consistently more than 180 or if he has headache, chest pain, nausea vomiting.

## 2016-10-18 NOTE — Telephone Encounter (Signed)
Saguier, [name], PA-C  to [employer]       1:20 PM  Eric [name],   I am glad it is coming down some however the upper range numbers of 160 still concern. If you could drop off daily readings by Wednesday of this coming week that would be helpful. I am still think losartan is good idea. If your highs were 140-150 top number then questionable. But spikes into 160 range is bit concerning. Keep checking bp's. Have a good weekend.   Thanks,  Saguier, [name], PA-C    This MyChart message has not been read.

## 2016-10-23 ENCOUNTER — Telehealth: Payer: Self-pay | Admitting: Medical

## 2016-10-23 ENCOUNTER — Telehealth: Payer: Self-pay | Admitting: Internal Medicine

## 2016-10-23 NOTE — Telephone Encounter (Signed)
error 

## 2016-10-23 NOTE — Telephone Encounter (Signed)
Pt dropped off white envelope with BP reading for provider to see, since provider asked pt to bring in reading. Document put at front office tray.

## 2016-10-23 NOTE — Telephone Encounter (Signed)
BP Readings document forwarded to Mackie Pai, PA-C/SLS 05/16

## 2016-10-29 NOTE — Telephone Encounter (Signed)
I reviewed all of pt bp readings. Approximated 2/3 reading are good the other 1/3 is on high side. I gave him 30 tabs of losartan and he is about to run out. To get tighter control of his highs  if he is agreeable I could rx combination of losartan with low dose diuretic. Dose 100 mg of losartan and 12.5 mg of hctz. It would be combination tablet. Would you see if he is agreeable to adding the diuretic.   Tell him thanks for getting all the readings.   If by chance he is reluctant to add diuretic then probably good idea to keep checking readings maybe just one time a day but see Dr. Larose Kells in a couple of weeks.   Let me know what he decides.

## 2016-11-01 MED ORDER — LOSARTAN POTASSIUM-HCTZ 100-12.5 MG PO TABS: 1.0000 | ORAL_TABLET | Freq: Every day | ORAL | 3 refills | Status: DC

## 2016-11-01 NOTE — Telephone Encounter (Signed)
Sent in combination losartan/hctz to pt pharmacy.

## 2016-11-01 NOTE — Telephone Encounter (Addendum)
Pt agrees to the combination pill of losartan + hctz.

## 2016-11-12 ENCOUNTER — Telehealth: Payer: Self-pay | Admitting: Internal Medicine

## 2016-11-12 DIAGNOSIS — I10 Essential (primary) hypertension: Secondary | ICD-10-CM

## 2016-11-12 NOTE — Telephone Encounter (Signed)
Recently, BP was elevated and a couple weeks ago HCTZ was added. Please asked patient how he is doing. If he is doing well and BPs are good, then simply schedule a BMP DX HTN, refill medications and follow-up with me November 2018 as planned. If he is not doing well or the BPs are elevated, schedule office visit with me within the next week.

## 2016-11-12 NOTE — Telephone Encounter (Signed)
BP readings were dropped off and forwarded to [place] on 10/23/2016 (see telephone note).

## 2016-11-12 NOTE — Telephone Encounter (Signed)
MyChart message sent to Pt.

## 2016-11-12 NOTE — Telephone Encounter (Signed)
Okay, please schedule a BMP

## 2016-11-18 NOTE — Addendum Note (Signed)
Addended byDamita Dunnings D on: 11/18/2016 01:10 PM   Modules accepted: Orders

## 2016-11-21 ENCOUNTER — Other Ambulatory Visit (INDEPENDENT_AMBULATORY_CARE_PROVIDER_SITE_OTHER): Payer: Medicare Other

## 2016-11-21 DIAGNOSIS — I10 Essential (primary) hypertension: Secondary | ICD-10-CM

## 2016-11-21 LAB — BASIC METABOLIC PANEL
BUN: 18 mg/dL (ref 6–23)
CALCIUM: 9.8 mg/dL (ref 8.4–10.5)
CO2: 29 meq/L (ref 19–32)
CREATININE: 1.1 mg/dL (ref 0.40–1.50)
Chloride: 100 mEq/L (ref 96–112)
GFR: 70.17 mL/min (ref >60.00)
GLUCOSE: 115 mg/dL — AB (ref 70–99)
Potassium: 3.4 mEq/L — ABNORMAL LOW (ref 3.5–5.1)
Sodium: 139 mEq/L (ref 135–145)

## 2017-02-17 ENCOUNTER — Other Ambulatory Visit: Payer: Self-pay | Admitting: Medical

## 2017-03-18 ENCOUNTER — Ambulatory Visit (INDEPENDENT_AMBULATORY_CARE_PROVIDER_SITE_OTHER): Payer: Medicare Other

## 2017-03-18 DIAGNOSIS — Z23 Encounter for immunization: Secondary | ICD-10-CM | POA: Diagnosis not present

## 2017-05-06 ENCOUNTER — Encounter: Payer: Self-pay | Admitting: Internal Medicine

## 2017-05-06 ENCOUNTER — Ambulatory Visit (INDEPENDENT_AMBULATORY_CARE_PROVIDER_SITE_OTHER): Payer: Medicare Other | Admitting: Internal Medicine

## 2017-05-06 VITALS — BP 116/72 | HR 77 | Temp 98.2°F | Resp 14 | Ht 68.0 in | Wt 196.5 lb

## 2017-05-06 DIAGNOSIS — Z0000 Encounter for general adult medical examination without abnormal findings: Secondary | ICD-10-CM | POA: Diagnosis not present

## 2017-05-06 DIAGNOSIS — E038 Other specified hypothyroidism: Secondary | ICD-10-CM | POA: Diagnosis not present

## 2017-05-06 DIAGNOSIS — I10 Essential (primary) hypertension: Secondary | ICD-10-CM | POA: Diagnosis not present

## 2017-05-06 DIAGNOSIS — R739 Hyperglycemia, unspecified: Secondary | ICD-10-CM

## 2017-05-06 LAB — BASIC METABOLIC PANEL
BUN: 16 mg/dL (ref 6–23)
CALCIUM: 9.8 mg/dL (ref 8.4–10.5)
CO2: 27 mEq/L (ref 19–32)
Chloride: 102 mEq/L (ref 96–112)
Creatinine, Ser: 1.11 mg/dL (ref 0.40–1.50)
GFR: 69.35 mL/min (ref >60.00)
Glucose, Bld: 118 mg/dL — ABNORMAL HIGH (ref 70–99)
POTASSIUM: 3.6 meq/L (ref 3.5–5.1)
SODIUM: 140 meq/L (ref 135–145)

## 2017-05-06 LAB — TSH: TSH: 1.87 u[IU]/mL (ref 0.35–4.50)

## 2017-05-06 LAB — HEMOGLOBIN A1C: HEMOGLOBIN A1C: 5.7 % (ref 4.6–6.5)

## 2017-05-06 LAB — MAGNESIUM: Magnesium: 2.2 mg/dL (ref 1.5–2.5)

## 2017-05-06 NOTE — Progress Notes (Signed)
maPre visit review using our clinic review tool, if applicable. No additional management support is needed unless otherwise documented below in the visit note.

## 2017-05-06 NOTE — Progress Notes (Signed)
Subjective:    Patient ID: Eric [name], male    DOB: 30-Jun-1945, 71 y.o.   MRN: [id number]  DOS:  05/06/2017 Type of visit - description : CPX Interval history: In general feels well. Reports increasing cramps over the last 2 months, at the feet and calves, worse at night. HTN: Currently on losartan HCT, Procardia, metoprolol.  Ambulatory BPs very good. Reports occasional back pain when he bends over, no radiation. Aortic stenosis: Denies presyncope, dizziness, weakness  Review of Systems   Other than above, a 14 point review of systems is negative    Past Medical History:  Diagnosis Date  . Abnormal LFTs   . Aortic stenosis 04/15/2011  . Hyperlipidemia   . Hypertension   . Hypothyroidism   . Macular edema    Dr  Sherlynn Stalls     Past Surgical History:  Procedure Laterality Date  . CARDIAC CATHETERIZATION  11/10/00  . CHOLECYSTECTOMY    . INGUINAL HERNIA REPAIR     (B)  . POLYPECTOMY    . TONSILLECTOMY      Social History   Socioeconomic History  . Marital status: Married    Spouse name: Not on file  . Number of children: 3  . Years of education: Not on file  . Highest education level: Not on file  Social Needs  . Financial resource strain: Not on file  . Food insecurity - worry: Not on file  . Food insecurity - inability: Not on file  . Transportation needs - medical: Not on file  . Transportation needs - non-medical: Not on file  Occupational History  . Occupation: retired- used to work for [employer]: 05/2008  Tobacco Use  . Smoking status: Former Smoker    Last attempt to quit: 04/15/1990    Years since quitting: 27.0  . Smokeless tobacco: Never Used  . Tobacco comment: used to smoke 2 ppd  Substance and Sexual Activity  . Alcohol use: No    Comment: no ETOH since the 90s  . Drug use: No  . Sexual activity: Not on file  Other Topics Concern  . Not on file  Social History Narrative   Lives w/ wife   3 children, 7 g-kids              Family History  Problem Relation Age of Onset  . Diabetes Mother   . Stroke Mother   . Diabetes Brother        ?  . Coronary artery disease Brother        Male 1st degree relative >50, had CABG in his 69s  . Colon cancer Neg Hx   . Prostate cancer Neg Hx   . Colon polyps Neg Hx   . Esophageal cancer Neg Hx   . Rectal cancer Neg Hx   . Stomach cancer Neg Hx      Allergies as of 05/06/2017      Reactions   Atorvastatin Other (See Comments)   REACTION: myalgia   Ezetimibe Other (See Comments)   REACTION: myalgia      Medication List        Accurate as of 05/06/17  5:08 PM. Always use your most recent med list.          aspirin 81 MG tablet Take 81 mg by mouth daily.   levothyroxine 88 MCG tablet Commonly known as:  SYNTHROID, LEVOTHROID Take 1 tablet (88 mcg total) by mouth daily before breakfast.  losartan-hydrochlorothiazide 100-12.5 MG tablet Commonly known as:  HYZAAR TAKE 1 TABLET BY MOUTH EVERY DAY   metoprolol tartrate 50 MG tablet Commonly known as:  LOPRESSOR Take 1 tablet (50 mg total) by mouth 2 (two) times daily.   NIFEdipine 30 MG 24 hr tablet Commonly known as:  PROCARDIA-XL/ADALAT-CC/NIFEDICAL-XL Take 1 tablet (30 mg total) by mouth daily.   rosuvastatin 20 MG tablet Commonly known as:  CRESTOR Take 1 tablet (20 mg total) by mouth daily.          Objective:   Physical Exam BP 116/72 (BP Location: Left Arm, Patient Position: Sitting, Cuff Size: Small)   Pulse 77   Temp 98.2 F (36.8 C) (Oral)   Resp 14   Ht 5\' 8"  (1.727 m)   Wt 196 lb 8 oz (89.1 kg)   SpO2 96%   BMI 29.88 kg/m  General:   Well developed, well nourished . NAD.  Neck: No  thyromegaly  HEENT:  Normocephalic . Face symmetric, atraumatic Lungs:  CTA B Normal respiratory effort, no intercostal retractions, no accessory muscle use. Heart: RRR,   + systolic murmur.  No pretibial edema bilaterally  Abdomen:  Not distended, soft, non-tender. No rebound or  rigidity.   Skin: Exposed areas without rash. Not pale. Not jaundice Neurologic:  alert & oriented X3.  Speech normal, gait appropriate for age and unassisted Strength symmetric and appropriate for age.  Psych: Cognition and judgment appear intact.  Cooperative with normal attention span and concentration.  Behavior appropriate. No anxious or depressed appearing.     Assessment & Plan:   Assessment HTN Hyperlipidemia Hypothyroidism Increase LFTs Ultrasound 2002 done for increased LFTs: Normal liver; Hep C negative 02-16-15 Aortic stenosis dx  2012, last echo 06-2015, recheck 2 years.  See report Macular degeneration, cataracts  +FH CAD Brother age 51s  PLAN HTN: Since last year, he continue with Procardia and Lopressor, losartan was added on April 2018 and subsequently HCTZ.  Currently on Hyzaar. BP is well controlled, checking BMP Mg Leg cramps: Symptoms become bothersome 2 months ago.  Checking electrolytes, noting that he started HCTZ few months ago.  Recommend tonic water at night and Mg supplements.  Consider Flexeril Hyperlipidemia: On Crestor, well-controlled per last FLP Hypothyroidism: On Synthroid, check TSH. Aortic stenosis: Asx.  Next echo 06-2017. Recheck A1c RTC 4 months

## 2017-05-06 NOTE — Assessment & Plan Note (Addendum)
Td 2012; Pneumonia shot 2013;  prevnar-- 2015;  zostavax:2016; shingrix discussed; had a flu shot -CCS: per patient: Cscope x 2, last 04-2006, cscope 12- 2018, next per GI -prostate ca screening: DRE normal 2017,  PSA went from 1.42 to 2.2, recheck psa today + FH CAD,currently asx, on aspirin  Labs:   BMP, magnesium, PSA, A1c, TSH -diet and exercise: counseled

## 2017-05-06 NOTE — Patient Instructions (Addendum)
GO TO THE LAB : Get the blood work     GO TO THE FRONT DESK Schedule your next appointment for a checkup in 4 months  For leg cramps:  try tonic water at night, you can also take over-the-counter magnesium supplements.

## 2017-05-06 NOTE — Assessment & Plan Note (Signed)
HTN: Since last year, he continue with Procardia and Lopressor, losartan was added on April 2018 and subsequently HCTZ.  Currently on Hyzaar. BP is well controlled, checking BMP Mg Leg cramps: Symptoms become bothersome 2 months ago.  Checking electrolytes, noting that he started HCTZ few months ago.  Recommend tonic water at night and Mg supplements.  Consider Flexeril Hyperlipidemia: On Crestor, well-controlled per last FLP Hypothyroidism: On Synthroid, check TSH. Aortic stenosis: Asx.  Next echo 06-2017. Recheck A1c RTC 4 months

## 2017-05-19 ENCOUNTER — Other Ambulatory Visit: Payer: Self-pay | Admitting: Internal Medicine

## 2017-06-17 ENCOUNTER — Other Ambulatory Visit: Payer: Self-pay | Admitting: Medical

## 2017-07-23 ENCOUNTER — Telehealth: Payer: Self-pay

## 2017-07-23 MED ORDER — OSELTAMIVIR PHOSPHATE 75 MG PO CAPS: 75.0000 mg | ORAL_CAPSULE | Freq: Every day | ORAL | 0 refills | Status: DC

## 2017-07-23 NOTE — Telephone Encounter (Signed)
Pt's wife today seen w/ flu-like sx's per Dr. Larose Kells send Tamiflu 75mg  1 tab daily, #10 and 0RF. Rx sent to [facility].

## 2017-09-03 ENCOUNTER — Ambulatory Visit: Payer: Medicare Other | Admitting: Internal Medicine

## 2017-09-03 ENCOUNTER — Encounter: Payer: Self-pay | Admitting: Internal Medicine

## 2017-09-03 VITALS — BP 118/68 | HR 76 | Temp 97.8°F | Resp 14 | Ht 68.0 in | Wt 190.5 lb

## 2017-09-03 DIAGNOSIS — E785 Hyperlipidemia, unspecified: Secondary | ICD-10-CM

## 2017-09-03 DIAGNOSIS — I10 Essential (primary) hypertension: Secondary | ICD-10-CM

## 2017-09-03 DIAGNOSIS — I350 Nonrheumatic aortic (valve) stenosis: Secondary | ICD-10-CM

## 2017-09-03 LAB — LIPID PANEL
CHOLESTEROL: 142 mg/dL (ref 0–200)
HDL: 46.9 mg/dL (ref >39.00)
LDL Cholesterol: 66 mg/dL (ref 0–99)
NonHDL: 95.32
Total CHOL/HDL Ratio: 3
Triglycerides: 149 mg/dL (ref 0.0–149.0)
VLDL: 29.8 mg/dL (ref 0.0–40.0)

## 2017-09-03 LAB — CBC WITH DIFFERENTIAL/PLATELET
BASOS PCT: 0.4 % (ref 0.0–3.0)
Basophils Absolute: 0 10*3/uL (ref 0.0–0.1)
EOS PCT: 1.3 % (ref 0.0–5.0)
Eosinophils Absolute: 0.1 10*3/uL (ref 0.0–0.7)
HCT: 40.8 % (ref 39.0–52.0)
HEMOGLOBIN: 14 g/dL (ref 13.0–17.0)
Lymphocytes Relative: 27.4 % (ref 12.0–46.0)
Lymphs Abs: 1.2 10*3/uL (ref 0.7–4.0)
MCHC: 34.4 g/dL (ref 30.0–36.0)
MCV: 91.8 fl (ref 78.0–100.0)
MONO ABS: 0.5 10*3/uL (ref 0.1–1.0)
MONOS PCT: 11 % (ref 3.0–12.0)
Neutro Abs: 2.6 10*3/uL (ref 1.4–7.7)
Neutrophils Relative %: 59.9 % (ref 43.0–77.0)
Platelets: 245 10*3/uL (ref 150.0–400.0)
RBC: 4.44 Mil/uL (ref 4.22–5.81)
RDW: 13.1 % (ref 11.5–15.5)
WBC: 4.4 10*3/uL (ref 4.0–10.5)

## 2017-09-03 LAB — COMPREHENSIVE METABOLIC PANEL
ALK PHOS: 56 U/L (ref 39–117)
ALT: 19 U/L (ref 0–53)
AST: 26 U/L (ref 0–37)
Albumin: 4.1 g/dL (ref 3.5–5.2)
BUN: 16 mg/dL (ref 6–23)
CHLORIDE: 102 meq/L (ref 96–112)
CO2: 32 meq/L (ref 19–32)
Calcium: 9.7 mg/dL (ref 8.4–10.5)
Creatinine, Ser: 1.06 mg/dL (ref 0.40–1.50)
GFR: 73.07 mL/min (ref >60.00)
GLUCOSE: 118 mg/dL — AB (ref 70–99)
POTASSIUM: 3.9 meq/L (ref 3.5–5.1)
SODIUM: 141 meq/L (ref 135–145)
TOTAL PROTEIN: 7.5 g/dL (ref 6.0–8.3)
Total Bilirubin: 0.9 mg/dL (ref 0.2–1.2)

## 2017-09-03 NOTE — Progress Notes (Signed)
Subjective:    Patient ID: Eric [name], male    DOB: [birth year], 72 y.o.   MRN: [id number]  DOS:  09/03/2017 Type of visit - description : rov Interval history: HTN: Reports excellent ambulatory BPs Last visit he had leg cramps, resolved after he increase his water intake. Good compliance with meds.   Review of Systems Denies chest pain, difficulty breathing. No palpitations, no DOE or fainty feeling.  Past Medical History:  Diagnosis Date  . Abnormal LFTs   . Aortic stenosis 04/15/2011  . Hyperlipidemia   . Hypertension   . Hypothyroidism   . Macular edema    Dr  Sherlynn Stalls     Past Surgical History:  Procedure Laterality Date  . CARDIAC CATHETERIZATION  11/10/00  . CHOLECYSTECTOMY    . INGUINAL HERNIA REPAIR     (B)  . POLYPECTOMY    . TONSILLECTOMY      Social History   Socioeconomic History  . Marital status: Married    Spouse name: Not on file  . Number of children: 3  . Years of education: Not on file  . Highest education level: Not on file  Occupational History  . Occupation: retired- used to work for [employer]: 05/2008  Social Needs  . Financial resource strain: Not on file  . Food insecurity:    Worry: Not on file    Inability: Not on file  . Transportation needs:    Medical: Not on file    Non-medical: Not on file  Tobacco Use  . Smoking status: Former Smoker    Last attempt to quit: 04/15/1990    Years since quitting: 27.4  . Smokeless tobacco: Never Used  . Tobacco comment: used to smoke 2 ppd  Substance and Sexual Activity  . Alcohol use: No    Comment: no ETOH since the 90s  . Drug use: No  . Sexual activity: Not on file  Lifestyle  . Physical activity:    Days per week: Not on file    Minutes per session: Not on file  . Stress: Not on file  Relationships  . Social connections:    Talks on phone: Not on file    Gets together: Not on file    Attends religious service: Not on file    Active member of club or  organization: Not on file    Attends meetings of clubs or organizations: Not on file    Relationship status: Not on file  . Intimate partner violence:    Fear of current or ex partner: Not on file    Emotionally abused: Not on file    Physically abused: Not on file    Forced sexual activity: Not on file  Other Topics Concern  . Not on file  Social History Narrative   Lives w/ wife   3 children, 7 g-kids                Allergies as of 09/03/2017      Reactions   Atorvastatin Other (See Comments)   REACTION: myalgia   Ezetimibe Other (See Comments)   REACTION: myalgia      Medication List        Accurate as of 09/03/17  6:31 PM. Always use your most recent med list.          aspirin 81 MG tablet Take 81 mg by mouth daily.   levothyroxine 88 MCG tablet Commonly known as:  SYNTHROID, LEVOTHROID TAKE  1 TABLET BY MOUTH EVERY MORNING BEFORE BREAKFAST   losartan-hydrochlorothiazide 100-12.5 MG tablet Commonly known as:  HYZAAR Take 1 tablet by mouth daily.   metoprolol tartrate 50 MG tablet Commonly known as:  LOPRESSOR TAKE 1 TABLET (50 MG TOTAL) BY MOUTH 2 (TWO) TIMES DAILY.   NIFEdipine 30 MG 24 hr tablet Commonly known as:  PROCARDIA-XL/ADALAT-CC/NIFEDICAL-XL TAKE 1 TABLET BY MOUTH DAILY   rosuvastatin 20 MG tablet Commonly known as:  CRESTOR TAKE 1 TABLET BY MOUTH DAILY          Objective:   Physical Exam BP 118/68 (BP Location: Left Arm, Patient Position: Sitting, Cuff Size: Small)   Pulse 76   Temp 97.8 F (36.6 C) (Oral)   Resp 14   Ht 5\' 8"  (1.727 m)   Wt 190 lb 8 oz (86.4 kg)   SpO2 98%   BMI 28.97 kg/m   General:   Well developed, well nourished . NAD.  HEENT:  Normocephalic . Face symmetric, atraumatic Lungs:  CTA B Normal respiratory effort, no intercostal retractions, no accessory muscle use. Heart: RRR, + systolic murmur.  No pretibial edema bilaterally  Skin: Not pale. Not jaundice Neurologic:  alert & oriented X3.  Speech  normal, gait appropriate for age and unassisted Psych--  Cognition and judgment appear intact.  Cooperative with normal attention span and concentration.  Behavior appropriate. No anxious or depressed appearing.      Assessment & Plan:   Assessment HTN Hyperlipidemia Hypothyroidism Increase LFTs Ultrasound 2002 done for increased LFTs: Normal liver; Hep C negative 02-16-15 Aortic stenosis dx  2012, last echo 06-2015, recheck 2 years.  See report Macular degeneration, cataracts  +FH CAD Brother age 92s  PLAN HTN: Well-controlled on Hyzaar, Lopressor and nifedipine.  Checking a CMP and CBC High cholesterol: On Crestor, checking FLP. Aortic stenosis: Asx, recheck echocardiogram. RTC, CPX by 11 or 05-2018.

## 2017-09-03 NOTE — Assessment & Plan Note (Signed)
HTN: Well-controlled on Hyzaar, Lopressor and nifedipine.  Checking a CMP and CBC High cholesterol: On Crestor, checking FLP. Aortic stenosis: Asx, recheck echocardiogram. RTC, CPX by 11 or 05-2018.

## 2017-09-03 NOTE — Progress Notes (Signed)
Pre visit review using our clinic review tool, if applicable. No additional management support is needed unless otherwise documented below in the visit note. 

## 2017-09-03 NOTE — Patient Instructions (Signed)
GO TO THE LAB : Get the blood work     GO TO THE FRONT DESK Schedule your next appointment for a yearly checkup by November or December 2019  We will schedule a echocardiogram of your heart

## 2017-09-08 ENCOUNTER — Ambulatory Visit (HOSPITAL_COMMUNITY): Payer: Medicare Other | Attending: Internal Medicine

## 2017-09-08 ENCOUNTER — Other Ambulatory Visit: Payer: Self-pay

## 2017-09-08 DIAGNOSIS — I350 Nonrheumatic aortic (valve) stenosis: Secondary | ICD-10-CM | POA: Diagnosis not present

## 2017-09-08 DIAGNOSIS — E785 Hyperlipidemia, unspecified: Secondary | ICD-10-CM | POA: Insufficient documentation

## 2017-09-08 DIAGNOSIS — I083 Combined rheumatic disorders of mitral, aortic and tricuspid valves: Secondary | ICD-10-CM | POA: Insufficient documentation

## 2017-09-08 DIAGNOSIS — I10 Essential (primary) hypertension: Secondary | ICD-10-CM | POA: Diagnosis not present

## 2017-09-24 DIAGNOSIS — H3581 Retinal edema: Secondary | ICD-10-CM | POA: Insufficient documentation

## 2017-09-24 DIAGNOSIS — R7989 Other specified abnormal findings of blood chemistry: Secondary | ICD-10-CM | POA: Insufficient documentation

## 2017-09-24 DIAGNOSIS — R945 Abnormal results of liver function studies: Secondary | ICD-10-CM | POA: Insufficient documentation

## 2017-09-29 NOTE — Progress Notes (Signed)
Cardiology Office Note:    Date:  09/30/2017   ID:  Eric [name], DOB 12/11/1945, MRN [id number]  PCP:  Colon Branch, MD  Cardiologist:  Shirlee More, MD   Referring MD: Colon Branch, MD  ASSESSMENT:    1. Nonrheumatic aortic valve stenosis   2. Essential hypertension   3. Hyperlipidemia, unspecified hyperlipidemia type   4. Bicuspid aortic valve   5. Preop cardiovascular exam    PLAN:    In order of problems listed above:  1. Clinically he appears to have a bicuspid aortic valve first onset of murmur 15 years ago and recently has progressed rapidly in the last 2 years.  Hemodynamically valve stenosis is severe and he is symptomatic with shortness of breath exercise intolerance and subtle symptoms of heart failure.  After review of options risk and benefits he will undergo right and left heart catheterization with Dr. Burt Knack and then clinical decision making regarding valvular intervention with his low risk and probable bicuspid valve.  He had previous coronary angiography in 2012 that was normal.  He has no anginal discomfort.  He has no teeth and does not require oral surgery evaluation.  He may require CTA of the chest and surgery is being considered.  I looked at the echocardiogram and just could not see the descending aorta well. 2. Stable continue current treatment including ARB diuretic and calcium channel blocker 3. Stable continue treatment with high intensity statin 4. See discussion above 5. EKG and labs will be done prior to cardiac catheterization.  Next appointment one month   Medication Adjustments/Labs and Tests Ordered: Current medicines are reviewed at length with the patient today.  Concerns regarding medicines are outlined above.  Orders Placed This Encounter  Procedures  . DG Chest 2 View  . CBC  . Basic metabolic panel  . Protime-INR  . EKG 12-Lead   No orders of the defined types were placed in this encounter.    Chief Complaint  Patient presents  with  . New Patient (Initial Visit)  . Cardiac Valve Problem    History of Present Illness:    Eric [name] is a 72 y.o. male who is being seen today for the evaluation of hemodynamically severe aortic stenosis which has progressed from moderate 2 yrs ago at the request of Colon Branch, MD. He was noted to have mild AS on echo 11/09/09 with P/M 20/10 mm hg and a murmur noted 04/15/12. He had normal coronary arteriography in 2012.  First time he is aware he had a heart murmur was approximately 15 years ago.  There is no family history of a bicuspid aortic valve.  In the last year he has not felt as well he had attributed to aging and now is unable to cut the grass without having to stop and rest with fatigue and shortness of breath and notices intermittent edema.  He also has had lightheadedness but no syncope he has had no chest pain.  He has had difficulty being breathless supine and previously he has had episodes of fairly typical PND.  His physical examination is consistent with his echocardiogram severe aortic stenosis and he is symptomatic.  Clinically and especially after looking at his echo I think he has a bicuspid valve and the difficult issue will be the best intervention as he will be low risk from a bicuspid valve and may be best served with surgical aortic valve replacement over T AVR.  He will be scheduled for right and  left heart catheterization.  He has dentures and has had no fever or chills Past Medical History:  Diagnosis Date  . Abnormal LFTs   . Aortic stenosis 04/15/2011  . Dizziness 04/21/2014  . Hyperglycemia 10/20/2014  . Hyperlipidemia   . Hypertension   . Hypothyroidism   . Macular edema    Dr  Sherlynn Stalls     Past Surgical History:  Procedure Laterality Date  . CARDIAC CATHETERIZATION  11/10/00  . CHOLECYSTECTOMY    . INGUINAL HERNIA REPAIR     (B)  . POLYPECTOMY    . TONSILLECTOMY      Current Medications: Current Meds  Medication Sig  . aspirin 81 MG  tablet Take 81 mg by mouth daily.    Marland Kitchen levothyroxine (SYNTHROID, LEVOTHROID) 88 MCG tablet TAKE 1 TABLET BY MOUTH EVERY MORNING BEFORE BREAKFAST  . losartan-hydrochlorothiazide (HYZAAR) 100-12.5 MG tablet Take 1 tablet by mouth daily.  . metoprolol tartrate (LOPRESSOR) 50 MG tablet TAKE 1 TABLET (50 MG TOTAL) BY MOUTH 2 (TWO) TIMES DAILY.  Marland Kitchen NIFEdipine (PROCARDIA-XL/ADALAT-CC/NIFEDICAL-XL) 30 MG 24 hr tablet TAKE 1 TABLET BY MOUTH DAILY  . rosuvastatin (CRESTOR) 20 MG tablet TAKE 1 TABLET BY MOUTH DAILY  . tobramycin (TOBREX) 0.3 % ophthalmic solution PUT 1 DROP IN RIGHT EYE 4X DAILY, BEGIN 1 DAY PRIOR TO TREATMENT,DAY OF, & DAY AFTER     Allergies:   Atorvastatin and Ezetimibe   Social History   Socioeconomic History  . Marital status: Married    Spouse name: Not on file  . Number of children: 3  . Years of education: Not on file  . Highest education level: Not on file  Occupational History  . Occupation: retired- used to work for [employer]: 05/2008  Social Needs  . Financial resource strain: Not on file  . Food insecurity:    Worry: Not on file    Inability: Not on file  . Transportation needs:    Medical: Not on file    Non-medical: Not on file  Tobacco Use  . Smoking status: Former Smoker    Last attempt to quit: 04/15/1990    Years since quitting: 27.4  . Smokeless tobacco: Never Used  . Tobacco comment: used to smoke 2 ppd  Substance and Sexual Activity  . Alcohol use: No    Comment: no ETOH since the 90s  . Drug use: No  . Sexual activity: Not on file  Lifestyle  . Physical activity:    Days per week: Not on file    Minutes per session: Not on file  . Stress: Not on file  Relationships  . Social connections:    Talks on phone: Not on file    Gets together: Not on file    Attends religious service: Not on file    Active member of club or organization: Not on file    Attends meetings of clubs or organizations: Not on file    Relationship status: Not  on file  Other Topics Concern  . Not on file  Social History Narrative   Lives w/ wife   3 children, 7 g-kids               Family History: The patient's family history includes COPD in his father; Coronary artery disease in his brother; Diabetes in his brother and mother; Emphysema in his father; Stroke in his mother. There is no history of Colon cancer, Prostate cancer, Colon polyps, Esophageal cancer, Rectal cancer, or  Stomach cancer.  ROS:   ROS Please see the history of present illness.     All other systems reviewed and are negative.  EKGs/Labs/Other Studies Reviewed:    The following studies were reviewed today:  ECHO TTE 09/08/17:  I reviewed the echo and the valve appears bicuspid on SAX, the ascending aorta is not well visualized - Left ventricle: The cavity size was normal. Wall thickness was   normal. Systolic function was normal. The estimated ejection   fraction was in the range of 55% to 60%. - Aortic valve: AV is thickened, calcified with restricted motion   Peak and mean gradients through the valve are 68 and 43 mm Hg   respectively consistent with severe AS. There was mild   regurgitation. -Aortic valve:  AV is thickened, calcified with restricted motion Peak and mean gradients through the valve are 68 and 43 mm Hg respectively consistent with severe AS.  Doppler:  There was mild regurgitation.    VTI ratio of LVOT to aortic valve: 0.23. Valve area (VTI): 0.74 cm^2. Indexed valve area (VTI): 0.36 cm^2/m^2. Peak velocity ratio of LVOT to aortic valve: 0.22. Valve area (Vmax): 0.69 cm^2. Indexed valve area (Vmax): 0.34 cm^2/m^2. Mean velocity ratio of LVOT to aortic valve: 0.21. Valve area (Vmean): 0.67 cm^2. Indexed valve area (Vmean): 0.32 cm^2/m^2.    Mean gradient (S): 39 mm Hg. Peak gradient (S): 68 mm Hg. - Mitral valve: There was mild regurgitation. Impressions:  - Compared to echo from 2017, mean gradient is increased (23 to 43   mm Hg) AS now  severe.  Left heart cath 10/23/10:  LEFT VENTRICULOGRAPHY:  The left ventriculogram was performed in the RAO projection showed good wall motion with no evidence of hypokinesis.  The estimated ejection fraction was 60%. CONCLUSIONS:  Normal coronary angiography and left ventricular wall motion.  EKG:  EKG is  ordered today.  The ekg ordered today demonstrates sinus rhythm normal  Recent Labs: 05/06/2017: Magnesium 2.2; TSH 1.87 09/03/2017: ALT 19; BUN 16; Creatinine, Ser 1.06; Hemoglobin 14.0; Platelets 245.0; Potassium 3.9; Sodium 141  Recent Lipid Panel    Component Value Date/Time   CHOL 142 09/03/2017 1037   TRIG 149.0 09/03/2017 1037   TRIG 115 05/05/2006 0913   HDL 46.90 09/03/2017 1037   CHOLHDL 3 09/03/2017 1037   VLDL 29.8 09/03/2017 1037   LDLCALC 66 09/03/2017 1037   LDLDIRECT 76.5 04/07/2008 1057    Physical Exam:    VS:  BP 124/76 (BP Location: Right Arm, Patient Position: Sitting, Cuff Size: Normal)   Pulse 63   Ht 5\' 8"  (1.727 m)   Wt 189 lb 1.9 oz (85.8 kg)   SpO2 98%   BMI 28.76 kg/m     Wt Readings from Last 3 Encounters:  09/30/17 189 lb 1.9 oz (85.8 kg)  09/03/17 190 lb 8 oz (86.4 kg)  05/06/17 196 lb 8 oz (89.1 kg)     GEN:  Well nourished, well developed in no acute distress HEENT: Normal NECK: No JVD; No carotid bruits LYMPHATICS: No lymphadenopathy CARDIAC: 3/6 harsh late peaking SEM aortic area to carotids with single P2 and decreased carotid upstroke RRR,  RESPIRATORY:  Clear to auscultation without rales, wheezing or rhonchi  ABDOMEN: Soft, non-tender, non-distended MUSCULOSKELETAL:  No edema; No deformity  SKIN: Warm and dry NEUROLOGIC:  Alert and oriented x 3 PSYCHIATRIC:  Normal affect     Signed, Shirlee More, MD  09/30/2017 11:47 AM    [facility] 

## 2017-09-29 NOTE — H&P (View-Only) (Signed)
Cardiology Office Note:    Date:  09/30/2017   ID:  Eric [name], DOB [birth year], MRN [id number]  PCP:  Colon Branch, MD  Cardiologist:  Shirlee More, MD   Referring MD: Colon Branch, MD  ASSESSMENT:    1. Nonrheumatic aortic valve stenosis   2. Essential hypertension   3. Hyperlipidemia, unspecified hyperlipidemia type   4. Bicuspid aortic valve   5. Preop cardiovascular exam    PLAN:    In order of problems listed above:  1. Clinically he appears to have a bicuspid aortic valve first onset of murmur 15 years ago and recently has progressed rapidly in the last 2 years.  Hemodynamically valve stenosis is severe and he is symptomatic with shortness of breath exercise intolerance and subtle symptoms of heart failure.  After review of options risk and benefits he will undergo right and left heart catheterization with Dr. Burt Knack and then clinical decision making regarding valvular intervention with his low risk and probable bicuspid valve.  He had previous coronary angiography in 2012 that was normal.  He has no anginal discomfort.  He has no teeth and does not require oral surgery evaluation.  He may require CTA of the chest and surgery is being considered.  I looked at the echocardiogram and just could not see the descending aorta well. 2. Stable continue current treatment including ARB diuretic and calcium channel blocker 3. Stable continue treatment with high intensity statin 4. See discussion above 5. EKG and labs will be done prior to cardiac catheterization.  Next appointment one month   Medication Adjustments/Labs and Tests Ordered: Current medicines are reviewed at length with the patient today.  Concerns regarding medicines are outlined above.  Orders Placed This Encounter  Procedures  . DG Chest 2 View  . CBC  . Basic metabolic panel  . Protime-INR  . EKG 12-Lead   No orders of the defined types were placed in this encounter.    Chief Complaint  Patient presents  with  . New Patient (Initial Visit)  . Cardiac Valve Problem    History of Present Illness:    Eric [name] is a 72 y.o. male who is being seen today for the evaluation of hemodynamically severe aortic stenosis which has progressed from moderate 2 yrs ago at the request of Colon Branch, MD. He was noted to have mild AS on echo 11/09/09 with P/M 20/10 mm hg and a murmur noted 04/15/12. He had normal coronary arteriography in 2012.  First time he is aware he had a heart murmur was approximately 15 years ago.  There is no family history of a bicuspid aortic valve.  In the last year he has not felt as well he had attributed to aging and now is unable to cut the grass without having to stop and rest with fatigue and shortness of breath and notices intermittent edema.  He also has had lightheadedness but no syncope he has had no chest pain.  He has had difficulty being breathless supine and previously he has had episodes of fairly typical PND.  His physical examination is consistent with his echocardiogram severe aortic stenosis and he is symptomatic.  Clinically and especially after looking at his echo I think he has a bicuspid valve and the difficult issue will be the best intervention as he will be low risk from a bicuspid valve and may be best served with surgical aortic valve replacement over T AVR.  He will be scheduled for right and  left heart catheterization.  He has dentures and has had no fever or chills Past Medical History:  Diagnosis Date  . Abnormal LFTs   . Aortic stenosis 04/15/2011  . Dizziness 04/21/2014  . Hyperglycemia 10/20/2014  . Hyperlipidemia   . Hypertension   . Hypothyroidism   . Macular edema    Dr  Sherlynn Stalls     Past Surgical History:  Procedure Laterality Date  . CARDIAC CATHETERIZATION  11/10/00  . CHOLECYSTECTOMY    . INGUINAL HERNIA REPAIR     (B)  . POLYPECTOMY    . TONSILLECTOMY      Current Medications: Current Meds  Medication Sig  . aspirin 81 MG  tablet Take 81 mg by mouth daily.    Marland Kitchen levothyroxine (SYNTHROID, LEVOTHROID) 88 MCG tablet TAKE 1 TABLET BY MOUTH EVERY MORNING BEFORE BREAKFAST  . losartan-hydrochlorothiazide (HYZAAR) 100-12.5 MG tablet Take 1 tablet by mouth daily.  . metoprolol tartrate (LOPRESSOR) 50 MG tablet TAKE 1 TABLET (50 MG TOTAL) BY MOUTH 2 (TWO) TIMES DAILY.  Marland Kitchen NIFEdipine (PROCARDIA-XL/ADALAT-CC/NIFEDICAL-XL) 30 MG 24 hr tablet TAKE 1 TABLET BY MOUTH DAILY  . rosuvastatin (CRESTOR) 20 MG tablet TAKE 1 TABLET BY MOUTH DAILY  . tobramycin (TOBREX) 0.3 % ophthalmic solution PUT 1 DROP IN RIGHT EYE 4X DAILY, BEGIN 1 DAY PRIOR TO TREATMENT,DAY OF, & DAY AFTER     Allergies:   Atorvastatin and Ezetimibe   Social History   Socioeconomic History  . Marital status: Married    Spouse name: Not on file  . Number of children: 3  . Years of education: Not on file  . Highest education level: Not on file  Occupational History  . Occupation: retired- used to work for [employer]: 05/2008  Social Needs  . Financial resource strain: Not on file  . Food insecurity:    Worry: Not on file    Inability: Not on file  . Transportation needs:    Medical: Not on file    Non-medical: Not on file  Tobacco Use  . Smoking status: Former Smoker    Last attempt to quit: 04/15/1990    Years since quitting: 27.4  . Smokeless tobacco: Never Used  . Tobacco comment: used to smoke 2 ppd  Substance and Sexual Activity  . Alcohol use: No    Comment: no ETOH since the 90s  . Drug use: No  . Sexual activity: Not on file  Lifestyle  . Physical activity:    Days per week: Not on file    Minutes per session: Not on file  . Stress: Not on file  Relationships  . Social connections:    Talks on phone: Not on file    Gets together: Not on file    Attends religious service: Not on file    Active member of club or organization: Not on file    Attends meetings of clubs or organizations: Not on file    Relationship status: Not  on file  Other Topics Concern  . Not on file  Social History Narrative   Lives w/ wife   3 children, 7 g-kids               Family History: The patient's family history includes COPD in his father; Coronary artery disease in his brother; Diabetes in his brother and mother; Emphysema in his father; Stroke in his mother. There is no history of Colon cancer, Prostate cancer, Colon polyps, Esophageal cancer, Rectal cancer, or  Stomach cancer.  ROS:   ROS Please see the history of present illness.     All other systems reviewed and are negative.  EKGs/Labs/Other Studies Reviewed:    The following studies were reviewed today:  ECHO TTE 09/08/17:  I reviewed the echo and the valve appears bicuspid on SAX, the ascending aorta is not well visualized - Left ventricle: The cavity size was normal. Wall thickness was   normal. Systolic function was normal. The estimated ejection   fraction was in the range of 55% to 60%. - Aortic valve: AV is thickened, calcified with restricted motion   Peak and mean gradients through the valve are 68 and 43 mm Hg   respectively consistent with severe AS. There was mild   regurgitation. -Aortic valve:  AV is thickened, calcified with restricted motion Peak and mean gradients through the valve are 68 and 43 mm Hg respectively consistent with severe AS.  Doppler:  There was mild regurgitation.    VTI ratio of LVOT to aortic valve: 0.23. Valve area (VTI): 0.74 cm^2. Indexed valve area (VTI): 0.36 cm^2/m^2. Peak velocity ratio of LVOT to aortic valve: 0.22. Valve area (Vmax): 0.69 cm^2. Indexed valve area (Vmax): 0.34 cm^2/m^2. Mean velocity ratio of LVOT to aortic valve: 0.21. Valve area (Vmean): 0.67 cm^2. Indexed valve area (Vmean): 0.32 cm^2/m^2.    Mean gradient (S): 39 mm Hg. Peak gradient (S): 68 mm Hg. - Mitral valve: There was mild regurgitation. Impressions:  - Compared to echo from 2017, mean gradient is increased (23 to 43   mm Hg) AS now  severe.  Left heart cath 10/23/10:  LEFT VENTRICULOGRAPHY:  The left ventriculogram was performed in the RAO projection showed good wall motion with no evidence of hypokinesis.  The estimated ejection fraction was 60%. CONCLUSIONS:  Normal coronary angiography and left ventricular wall motion.  EKG:  EKG is  ordered today.  The ekg ordered today demonstrates sinus rhythm normal  Recent Labs: 05/06/2017: Magnesium 2.2; TSH 1.87 09/03/2017: ALT 19; BUN 16; Creatinine, Ser 1.06; Hemoglobin 14.0; Platelets 245.0; Potassium 3.9; Sodium 141  Recent Lipid Panel    Component Value Date/Time   CHOL 142 09/03/2017 1037   TRIG 149.0 09/03/2017 1037   TRIG 115 05/05/2006 0913   HDL 46.90 09/03/2017 1037   CHOLHDL 3 09/03/2017 1037   VLDL 29.8 09/03/2017 1037   LDLCALC 66 09/03/2017 1037   LDLDIRECT 76.5 04/07/2008 1057    Physical Exam:    VS:  BP 124/76 (BP Location: Right Arm, Patient Position: Sitting, Cuff Size: Normal)   Pulse 63   Ht 5\' 8"  (1.727 m)   Wt 189 lb 1.9 oz (85.8 kg)   SpO2 98%   BMI 28.76 kg/m     Wt Readings from Last 3 Encounters:  09/30/17 189 lb 1.9 oz (85.8 kg)  09/03/17 190 lb 8 oz (86.4 kg)  05/06/17 196 lb 8 oz (89.1 kg)     GEN:  Well nourished, well developed in no acute distress HEENT: Normal NECK: No JVD; No carotid bruits LYMPHATICS: No lymphadenopathy CARDIAC: 3/6 harsh late peaking SEM aortic area to carotids with single P2 and decreased carotid upstroke RRR,  RESPIRATORY:  Clear to auscultation without rales, wheezing or rhonchi  ABDOMEN: Soft, non-tender, non-distended MUSCULOSKELETAL:  No edema; No deformity  SKIN: Warm and dry NEUROLOGIC:  Alert and oriented x 3 PSYCHIATRIC:  Normal affect     Signed, Shirlee More, MD  09/30/2017 11:47 AM    [facility] 

## 2017-09-30 ENCOUNTER — Ambulatory Visit: Payer: Medicare Other | Admitting: Cardiology

## 2017-09-30 ENCOUNTER — Encounter: Payer: Self-pay | Admitting: Cardiology

## 2017-09-30 ENCOUNTER — Ambulatory Visit (HOSPITAL_BASED_OUTPATIENT_CLINIC_OR_DEPARTMENT_OTHER)
Admission: RE | Admit: 2017-09-30 | Discharge: 2017-09-30 | Disposition: A | Discharge status: Home / Self Care | Payer: Medicare Other | Source: Ambulatory Visit | Attending: Cardiology | Admitting: Cardiology

## 2017-09-30 VITALS — BP 124/76 | HR 63 | Ht 68.0 in | Wt 189.1 lb

## 2017-09-30 DIAGNOSIS — Q231 Congenital insufficiency of aortic valve: Secondary | ICD-10-CM

## 2017-09-30 DIAGNOSIS — I350 Nonrheumatic aortic (valve) stenosis: Secondary | ICD-10-CM | POA: Diagnosis not present

## 2017-09-30 DIAGNOSIS — E785 Hyperlipidemia, unspecified: Secondary | ICD-10-CM

## 2017-09-30 DIAGNOSIS — I10 Essential (primary) hypertension: Secondary | ICD-10-CM | POA: Diagnosis not present

## 2017-09-30 DIAGNOSIS — Z01810 Encounter for preprocedural cardiovascular examination: Secondary | ICD-10-CM | POA: Diagnosis not present

## 2017-09-30 DIAGNOSIS — Q2381 Bicuspid aortic valve: Secondary | ICD-10-CM

## 2017-09-30 NOTE — Patient Instructions (Addendum)
Medication Instructions:  Your physician recommends that you continue on your current medications as directed. Please refer to the Current Medication list given to you today.  Labwork: Your physician recommends that you return for lab work in: 3 days. CBC, BMP, pt/inr  Testing/Procedures: You had an EKG today.  A chest x-ray takes a picture of the organs and structures inside the chest, including the heart, lungs, and blood vessels. This test can show several things, including, whether the heart is enlarges; whether fluid is building up in the lungs; and whether pacemaker / defibrillator leads are still in place.  Your physician has requested that you have a cardiac catheterization. Cardiac catheterization is used to diagnose and/or treat various heart conditions. Doctors may recommend this procedure for a number of different reasons. The most common reason is to evaluate chest pain. Chest pain can be a symptom of coronary artery disease (CAD), and cardiac catheterization can show whether plaque is narrowing or blocking your heart's arteries. This procedure is also used to evaluate the valves, as well as measure the blood flow and oxygen levels in different parts of your heart. For further information please visit [employer]uge[employer]iesta.tn. Please follow instruction sheet, as given.    [facility] [address], [place] [address] Dept: [phone] Loc: [phone]  Hicks Feick  09/30/2017  You are scheduled for a Cardiac Catheterization on Thursday, May 9 with Dr. [name].  1. Please arrive at the [facility] (Main Entrance A) at [facility]: [address] [place], [place] [address] at 5:30 AM (two hours before your procedure to ensure your preparation). Free valet parking service is available.   Special note: Every effort is made to have your procedure done on time. Please  understand that emergencies sometimes delay scheduled procedures.  2. Diet: Do not eat or drink anything after midnight prior to your procedure except sips of water to take medications.  3. Labs: You will return Friday for lab work at our office.  4. Medication instructions in preparation for your procedure:  On the morning of your procedure, take your Aspirin and any morning medicines NOT listed above.  You may use sips of water.  5. Plan for one night stay--bring personal belongings. 6. Bring a current list of your medications and current insurance cards. 7. You MUST have a responsible person to drive you home. 8. Someone MUST be with you the first 24 hours after you arrive home or your discharge will be delayed. 9. Please wear clothes that are easy to get on and off and wear slip-on shoes.  Thank you for allowing [place] to care for you!   -- [facility]  Follow-Up: Your physician recommends that you schedule a follow-up appointment in: 5 weeks.  Any Other Special Instructions Will Be Listed Below (If Applicable).     If you need a refill on your cardiac medications before your next appointment, please call your pharmacy.    Aortic Valve Stenosis Aortic valve stenosis is a narrowing of the aortic valve. The aortic valve opens and closes to regulate blood flow between the lower left chamber of the heart (left ventricle) and the blood vessel that leads away from the heart (aorta). When the aortic valve becomes narrow, it makes it difficult for the heart to pump blood into the aorta, which causes the heart to work harder. The extra work can weaken the heart over  time. Aortic valve stenosis can range from mild to severe. If untreated, it can become more severe over time and can lead to heart failure. What are the causes? This condition may be caused by:  Buildup of calcium around and on the valve. This can occur with aging. This is the most common cause  of aortic valve stenosis.  Birth defect.  Rheumatic fever.  Radiation to the chest.  What increases the risk? You may be more likely to develop this condition if:  You are over the age of 73.  You were born with an abnormal bicuspid valve.  What are the signs or symptoms? You may have no symptoms until your condition becomes severe. It may take 10-20 years for mild or moderate aortic valve stenosis to become severe. Symptoms may include:  Shortness of breath. This may get worse during physical activity.  Feeling unusually weak and tired (fatigue).  Extreme discomfort in the chest, neck, or arm (angina).  A heartbeat that is irregular or faster than normal (palpitations).  Dizziness or fainting. This may happen when you get physically tired or after you take certain heart medicines, such as nitroglycerin.  How is this diagnosed? This condition may be diagnosed with:  A physical exam.  Echocardiogram. This is a type of imaging test that uses sound waves (ultrasound) to make an image of your heart. There are two types that may be used: ? Transthoracic echocardiogram (TTE). This type of echocardiogram is noninvasive, and it is usually done first. ? Transesophageal echocardiogram (TEE). This type of echocardiogram is done by passing a flexible tube down your esophagus. The heart and the esophagus are close to each other, so your health care provider can take very clear, detailed pictures of the heart using this type of test.  Cardiac catheterization. In this procedure, a thin, flexible tube (catheter) is passed through a large vein in your neck, groin, or arm. This procedure provides information about arteries, structures, blood pressure, and oxygen levels in your heart.  Electrocardiogram (ECG). This records the electrical impulses of your heart and assesses heart function.  Stress tests. These are tests that evaluate the blood supply to your heart and your heart's response to  exercise.  Blood tests.  You may work with a health care provider who specializes in the heart (cardiologist). How is this treated? Treatment depends on how severe your condition is and what your symptoms are. You will need to have your heart checked regularly to make sure that your condition is not getting worse or causing serious problems. If your condition is mild, no treatment may be needed. Treatment may include:  Medicines that help keep your heart rate regular.  Medicines that thin your blood (anticoagulants) to prevent the formation of blood clots.  Antibiotic medicines to help prevent infection.  Surgery to replace your aortic valve. This is the most common treatment for aortic valve stenosis. Several types of surgeries are available. The surgery may be done through a large incision over your heart (open heart surgery), or it may be done using a minimally invasive technique (transcatheter aortic valve replacement, or TAVR).  Follow these instructions at home: Lifestyle   Limit alcohol intake to no more than 1 drink per day for nonpregnant women and 2 drinks per day for men. One drink equals 12 oz of beer, 5 oz of wine, or 1 oz of hard liquor.  Do not use any tobacco products, such as cigarettes, chewing tobacco, or e-cigarettes. If you need help  quitting, ask your health care provider.  Work with your health care provider to manage your blood pressure and cholesterol.  Maintain a healthy weight. Eating and drinking  Follow instructions from your health care provider about eating or drinking restrictions. ? Limit how much caffeine you drink. Caffeine can affect your heart's rate and rhythm.  Drink enough fluid to keep your urine clear or pale yellow.  Eat a heart-healthy diet. This should include plenty of fresh fruits and vegetables. If you eat meat, it should be lean cuts. Avoid foods that are: ? High in salt, saturated fat, or sugar. ? Canned or highly  processed. ? Fried. Activity  Return to your normal activities as told by your health care provider. Ask your health care provider what activities are safe for you.  Exercise regularly, as told by your health care provider. Ask your health care provider what types of exercise are safe for you.  If your aortic valve stenosis is mild, you may need to avoid only very intense physical activity. The more severe your aortic valve stenosis is, the more activities you may need to avoid. General instructions  Take over-the-counter and prescription medicines only as told by your health care provider.  If you are a woman and you plan to become pregnant, talk with your health care provider before you become pregnant.  Tell all health care providers who care for you that you have aortic valve stenosis.  Keep all follow-up visits as told by your health care provider. This is important. Contact a health care provider if:  You have a fever. Get help right away if:  You develop chest pain or tightness.  You develop shortness of breath or difficulty breathing.  You feel light-headed.  You feel like you might faint.  Your heartbeat is irregular or faster than normal. These symptoms may represent a serious problem that is an emergency. Do not wait to see if the symptoms will go away. Get medical help right away. Call your local emergency services (911 in the [place] Do not drive yourself to the hospital. This information is not intended to replace advice given to you by your health care provider. Make sure you discuss any questions you have with your health care provider. Document Released: 02/23/2003 Document Revised: 11/02/2015 Document Reviewed: 04/30/2015 Elsevier Interactive Patient Education  2017 [employer].

## 2017-10-04 LAB — BASIC METABOLIC PANEL
BUN / CREAT RATIO: 12 (ref 10–24)
BUN: 14 mg/dL (ref 8–27)
CHLORIDE: 102 mmol/L (ref 96–106)
CO2: 24 mmol/L (ref 20–29)
CREATININE: 1.18 mg/dL (ref 0.76–1.27)
Calcium: 9.3 mg/dL (ref 8.6–10.2)
GFR calc non Af Amer: 62 mL/min/{1.73_m2} (ref >59)
GFR, EST AFRICAN AMERICAN: 71 mL/min/{1.73_m2} (ref >59)
GLUCOSE: 106 mg/dL — AB (ref 65–99)
Potassium: 3.4 mmol/L — ABNORMAL LOW (ref 3.5–5.2)
SODIUM: 141 mmol/L (ref 134–144)

## 2017-10-04 LAB — CBC
Hematocrit: 41.4 % (ref 37.5–51.0)
Hemoglobin: 13.6 g/dL (ref 13.0–17.7)
MCH: 31.3 pg (ref 26.6–33.0)
MCHC: 32.9 g/dL (ref 31.5–35.7)
MCV: 95 fL (ref 79–97)
PLATELETS: 226 10*3/uL (ref 150–379)
RBC: 4.34 x10E6/uL (ref 4.14–5.80)
RDW: 13.8 % (ref 12.3–15.4)
WBC: 4.4 10*3/uL (ref 3.4–10.8)

## 2017-10-04 LAB — PROTIME-INR
INR: 1 (ref 0.8–1.2)
Prothrombin Time: 10.8 s (ref 9.1–12.0)

## 2017-10-06 ENCOUNTER — Ambulatory Visit: Payer: Medicare Other | Admitting: *Deleted

## 2017-10-14 ENCOUNTER — Telehealth: Payer: Self-pay | Admitting: *Deleted

## 2017-10-14 NOTE — Telephone Encounter (Addendum)
Catheterization scheduled at [facility] for: Thursday Oct 16, 2017 7:30 AM Verify arrival time and place: [place] Entrance A at: 5:30 AM  No solid food after midnight prior to cath, clear liquids until 5 AM day of procedure. Verify allergies in Epic Verify no diabetes medications.  Hold: Losartan /HCTZ AM of procedure.  AM meds can be  taken pre-cath with sip of water including: ASA 81 mg  Confirm patient has responsible person to drive home post procedure and observe patient for 24 hours: yes

## 2017-10-14 NOTE — Telephone Encounter (Signed)
Pt contacted pre-catheterization scheduled at [facility] for: Thursday Oct 16, 2017 7:30 AM Verified arrival time and place: [place] Entrance A at: 5:30 AM  No solid food after midnight prior to cath, clear liquids until 5 AM day of procedure. Verified allergies in Epic Verified no diabetes medications.  Hold: Losartan-HCTZ AM of cath  AM meds can be  taken pre-cath with sip of water including: ASA 81 mg  Confirmed patient has responsible person to drive home post procedure and observe patient for 24 hours: yes

## 2017-10-16 ENCOUNTER — Ambulatory Visit (HOSPITAL_COMMUNITY)
Admission: RE | Admit: 2017-10-16 | Discharge: 2017-10-16 | Disposition: A | Discharge status: Home / Self Care | Payer: Medicare Other | Source: Ambulatory Visit | Attending: Cardiovascular Disease | Admitting: Cardiovascular Disease

## 2017-10-16 ENCOUNTER — Ambulatory Visit (HOSPITAL_COMMUNITY)
Admission: RE | Disposition: A | Discharge status: Home / Self Care | Payer: Self-pay | Source: Ambulatory Visit | Attending: Cardiovascular Disease

## 2017-10-16 ENCOUNTER — Encounter (HOSPITAL_COMMUNITY): Payer: Self-pay | Admitting: Cardiovascular Disease

## 2017-10-16 DIAGNOSIS — Z9889 Other specified postprocedural states: Secondary | ICD-10-CM | POA: Diagnosis not present

## 2017-10-16 DIAGNOSIS — Z79890 Hormone replacement therapy (postmenopausal): Secondary | ICD-10-CM | POA: Diagnosis not present

## 2017-10-16 DIAGNOSIS — Z823 Family history of stroke: Secondary | ICD-10-CM | POA: Insufficient documentation

## 2017-10-16 DIAGNOSIS — Z87891 Personal history of nicotine dependence: Secondary | ICD-10-CM | POA: Insufficient documentation

## 2017-10-16 DIAGNOSIS — Z79899 Other long term (current) drug therapy: Secondary | ICD-10-CM | POA: Diagnosis not present

## 2017-10-16 DIAGNOSIS — E039 Hypothyroidism, unspecified: Secondary | ICD-10-CM | POA: Insufficient documentation

## 2017-10-16 DIAGNOSIS — Z7982 Long term (current) use of aspirin: Secondary | ICD-10-CM | POA: Diagnosis not present

## 2017-10-16 DIAGNOSIS — Q231 Congenital insufficiency of aortic valve: Secondary | ICD-10-CM

## 2017-10-16 DIAGNOSIS — Z9049 Acquired absence of other specified parts of digestive tract: Secondary | ICD-10-CM | POA: Diagnosis not present

## 2017-10-16 DIAGNOSIS — Z8249 Family history of ischemic heart disease and other diseases of the circulatory system: Secondary | ICD-10-CM | POA: Insufficient documentation

## 2017-10-16 DIAGNOSIS — I350 Nonrheumatic aortic (valve) stenosis: Secondary | ICD-10-CM | POA: Diagnosis present

## 2017-10-16 DIAGNOSIS — I10 Essential (primary) hypertension: Secondary | ICD-10-CM | POA: Insufficient documentation

## 2017-10-16 DIAGNOSIS — E785 Hyperlipidemia, unspecified: Secondary | ICD-10-CM | POA: Insufficient documentation

## 2017-10-16 HISTORY — PX: RIGHT/LEFT HEART CATH AND CORONARY ANGIOGRAPHY: CATH118266

## 2017-10-16 LAB — POCT I-STAT 3, ART BLOOD GAS (G3+)
BICARBONATE: 26.2 mmol/L (ref 20.0–28.0)
O2 Saturation: 71 %
PO2 ART: 39 mmHg — AB (ref 83.0–108.0)
TCO2: 28 mmol/L (ref 22–32)
pCO2 arterial: 47.2 mmHg (ref 32.0–48.0)
pH, Arterial: 7.352 (ref 7.350–7.450)

## 2017-10-16 LAB — POCT I-STAT 3, VENOUS BLOOD GAS (G3P V)
Acid-Base Excess: 1 mmol/L (ref 0.0–2.0)
BICARBONATE: 26.7 mmol/L (ref 20.0–28.0)
O2 Saturation: 97 %
PH VEN: 7.371 (ref 7.250–7.430)
TCO2: 28 mmol/L (ref 22–32)
pCO2, Ven: 46.1 mmHg (ref 44.0–60.0)
pO2, Ven: 90 mmHg — ABNORMAL HIGH (ref 32.0–45.0)

## 2017-10-16 LAB — POTASSIUM: Potassium: 3.7 mmol/L (ref 3.5–5.1)

## 2017-10-16 SURGERY — RIGHT/LEFT HEART CATH AND CORONARY ANGIOGRAPHY
Anesthesia: LOCAL

## 2017-10-16 MED ORDER — ASPIRIN 81 MG PO CHEW: 81.0000 mg | CHEWABLE_TABLET | ORAL | Status: DC

## 2017-10-16 MED ORDER — VERAPAMIL HCL 2.5 MG/ML IV SOLN
INTRAVENOUS | Status: AC
  Filled 2017-10-16: qty 2

## 2017-10-16 MED ORDER — SODIUM CHLORIDE 0.9% FLUSH: 3.0000 mL | INTRAVENOUS | Status: DC | PRN

## 2017-10-16 MED ORDER — LIDOCAINE HCL (PF) 1 % IJ SOLN
INTRAMUSCULAR | Status: DC | PRN
  Administered 2017-10-16: 2 mL via INTRADERMAL
  Administered 2017-10-16: 1 mL via INTRADERMAL

## 2017-10-16 MED ORDER — MIDAZOLAM HCL 2 MG/2ML IJ SOLN
INTRAMUSCULAR | Status: AC
  Filled 2017-10-16: qty 2

## 2017-10-16 MED ORDER — FENTANYL CITRATE (PF) 100 MCG/2ML IJ SOLN
INTRAMUSCULAR | Status: DC | PRN
  Administered 2017-10-16 (×2): 25 ug via INTRAVENOUS

## 2017-10-16 MED ORDER — HEPARIN (PORCINE) IN NACL 2-0.9 UNITS/ML
INTRAMUSCULAR | Status: AC | PRN
  Administered 2017-10-16 (×2): 500 mL

## 2017-10-16 MED ORDER — SODIUM CHLORIDE 0.9 % WEIGHT BASED INFUSION
3.0000 mL/kg/h | INTRAVENOUS | Status: AC
  Administered 2017-10-16: 3 mL/kg/h via INTRAVENOUS

## 2017-10-16 MED ORDER — SODIUM CHLORIDE 0.9 % IV SOLN: 250.0000 mL | INTRAVENOUS | Status: DC | PRN

## 2017-10-16 MED ORDER — VERAPAMIL HCL 2.5 MG/ML IV SOLN
INTRAVENOUS | Status: DC | PRN
  Administered 2017-10-16: 10 mL via INTRA_ARTERIAL

## 2017-10-16 MED ORDER — MIDAZOLAM HCL 2 MG/2ML IJ SOLN
INTRAMUSCULAR | Status: DC | PRN
  Administered 2017-10-16 (×2): 1 mg via INTRAVENOUS

## 2017-10-16 MED ORDER — SODIUM CHLORIDE 0.9 % WEIGHT BASED INFUSION: 1.0000 mL/kg/h | INTRAVENOUS | Status: DC

## 2017-10-16 MED ORDER — HEPARIN (PORCINE) IN NACL 1000-0.9 UT/500ML-% IV SOLN
INTRAVENOUS | Status: AC
  Filled 2017-10-16: qty 1000

## 2017-10-16 MED ORDER — HEPARIN SODIUM (PORCINE) 1000 UNIT/ML IJ SOLN
INTRAMUSCULAR | Status: AC
  Filled 2017-10-16: qty 1

## 2017-10-16 MED ORDER — SODIUM CHLORIDE 0.9% FLUSH: 3.0000 mL | Freq: Two times a day (BID) | INTRAVENOUS | Status: DC

## 2017-10-16 MED ORDER — LIDOCAINE HCL (PF) 1 % IJ SOLN
INTRAMUSCULAR | Status: AC
  Filled 2017-10-16: qty 30

## 2017-10-16 MED ORDER — IOHEXOL 350 MG/ML SOLN
INTRAVENOUS | Status: DC | PRN
  Administered 2017-10-16: 30 mL via INTRA_ARTERIAL

## 2017-10-16 MED ORDER — HEPARIN SODIUM (PORCINE) 1000 UNIT/ML IJ SOLN
INTRAMUSCULAR | Status: DC | PRN
  Administered 2017-10-16: 5000 [IU] via INTRAVENOUS

## 2017-10-16 MED ORDER — FENTANYL CITRATE (PF) 100 MCG/2ML IJ SOLN
INTRAMUSCULAR | Status: AC
  Filled 2017-10-16: qty 2

## 2017-10-16 SURGICAL SUPPLY — 13 items
CATH BALLN WEDGE 5F 110CM (CATHETERS) ×2 IMPLANT
CATH INFINITI 5 FR JL3.5 (CATHETERS) ×2 IMPLANT
CATH INFINITI JR4 5F (CATHETERS) ×2 IMPLANT
DEVICE RAD COMP TR BAND LRG (VASCULAR PRODUCTS) ×2 IMPLANT
GUIDEWIRE .025 260CM (WIRE) ×2 IMPLANT
GUIDEWIRE INQWIRE 1.5J.035X260 (WIRE) ×1 IMPLANT
INQWIRE 1.5J .035X260CM (WIRE) ×2
KIT HEART LEFT (KITS) ×2 IMPLANT
PACK CARDIAC CATHETERIZATION (CUSTOM PROCEDURE TRAY) ×2 IMPLANT
SHEATH GLIDE SLENDER 4/5FR (SHEATH) ×2 IMPLANT
SHEATH RAIN RADIAL 21G 6FR (SHEATH) ×2 IMPLANT
TRANSDUCER W/STOPCOCK (MISCELLANEOUS) ×2 IMPLANT
TUBING CIL FLEX 10 FLL-RA (TUBING) ×2 IMPLANT

## 2017-10-16 NOTE — Interval H&P Note (Signed)
History and Physical Interval Note:  10/16/2017 8:29 AM  Eric [name] Cancer  has presented today for surgery, with the diagnosis of as  The various methods of treatment have been discussed with the patient and family. After consideration of risks, benefits and other options for treatment, the patient has consented to  Procedure(s): RIGHT/LEFT HEART CATH AND CORONARY ANGIOGRAPHY (N/A) as a surgical intervention .  The patient's history has been reviewed, patient examined, no change in status, stable for surgery.  I have reviewed the patient's chart and labs.  Questions were answered to the patient's satisfaction.     [name]

## 2017-10-16 NOTE — Discharge Instructions (Signed)

## 2017-10-17 ENCOUNTER — Telehealth: Payer: Self-pay

## 2017-10-17 DIAGNOSIS — I350 Nonrheumatic aortic (valve) stenosis: Secondary | ICD-10-CM

## 2017-10-17 NOTE — Telephone Encounter (Signed)
TAVR CT studies ordered. Once scheduled, will arrange TCTS appointment with Dr. Roxy Manns or [facility].

## 2017-10-17 NOTE — Telephone Encounter (Signed)
-----   Message from [name], MD sent at 10/16/2017 12:47 PM EDT ----- Eric [name] - this is a Shirlee More patient who was referred for cath this morning. He has severe AS, probably bicuspid. Family had lots of questions about TAVR. I set expectations that he would like be treated with conventional surgery.   Can you order TAVR CTA studies and then refer to Dr Roxy Manns or Cyndia Bent for surgical eval?  thx - Ronalee Belts

## 2017-10-21 MED FILL — Heparin Sod (Porcine)-NaCl IV Soln 1000 Unit/500ML-0.9%: INTRAVENOUS | Qty: 1000 | Status: AC

## 2017-10-21 NOTE — Telephone Encounter (Signed)
Scheduled patient for TAVR CTA studies 6/3 at 1100. Patient will come prior to appointment for BMET on 5/31 and pick up instruction letter.  Referral placed for Dr. Roxy Manns or Dr. Cyndia Bent to be scheduled after CT scan. Patient agrees with treatment plan.

## 2017-10-22 NOTE — Telephone Encounter (Signed)
Patient has OV with Dr. Roxy Manns 6/4.

## 2017-10-23 ENCOUNTER — Telehealth: Payer: Self-pay | Admitting: Cardiology

## 2017-10-23 NOTE — Telephone Encounter (Signed)
No need he is in good hands

## 2017-10-23 NOTE — Telephone Encounter (Signed)
New message:        Pt's wife is calling to see if appt on 5/29 is still necessary since everything is going a different way than planned. Pt has not had his heart cath.

## 2017-10-23 NOTE — Telephone Encounter (Signed)
Patient advised no need to follow-up with Dr. Bettina Gavia at this time. Advised appointment has been cancelled. Patient verbalized understanding. No further questions.

## 2017-10-23 NOTE — Addendum Note (Signed)
Addended by: Harland German A on: 10/23/2017 01:08 PM   Modules accepted: Orders

## 2017-10-23 NOTE — Telephone Encounter (Signed)
Patient had cardiac catheterization on 5/9. Patient is scheduled for cardiac CTA and appointment with Dr. Roxy Manns. Patient wants to know if he still needs to follow-up with Dr. Bettina Gavia as he is being referred to cardiothoracic surgery. Please advise.

## 2017-11-05 ENCOUNTER — Ambulatory Visit: Payer: Medicare Other | Admitting: Cardiology

## 2017-11-07 ENCOUNTER — Other Ambulatory Visit: Payer: Medicare Other | Admitting: *Deleted

## 2017-11-07 DIAGNOSIS — I350 Nonrheumatic aortic (valve) stenosis: Secondary | ICD-10-CM

## 2017-11-07 LAB — BASIC METABOLIC PANEL
BUN / CREAT RATIO: 13 (ref 10–24)
BUN: 16 mg/dL (ref 8–27)
CALCIUM: 9.6 mg/dL (ref 8.6–10.2)
CHLORIDE: 99 mmol/L (ref 96–106)
CO2: 26 mmol/L (ref 20–29)
Creatinine, Ser: 1.27 mg/dL (ref 0.76–1.27)
GFR calc non Af Amer: 56 mL/min/{1.73_m2} — ABNORMAL LOW (ref >59)
GFR, EST AFRICAN AMERICAN: 65 mL/min/{1.73_m2} (ref >59)
GLUCOSE: 106 mg/dL — AB (ref 65–99)
POTASSIUM: 3.7 mmol/L (ref 3.5–5.2)
Sodium: 141 mmol/L (ref 134–144)

## 2017-11-10 ENCOUNTER — Ambulatory Visit (HOSPITAL_COMMUNITY)
Admission: RE | Admit: 2017-11-10 | Discharge: 2017-11-10 | Disposition: A | Discharge status: Home / Self Care | Payer: Medicare Other | Source: Ambulatory Visit | Attending: Cardiovascular Disease | Admitting: Cardiovascular Disease

## 2017-11-10 DIAGNOSIS — I350 Nonrheumatic aortic (valve) stenosis: Secondary | ICD-10-CM

## 2017-11-10 DIAGNOSIS — N400 Enlarged prostate without lower urinary tract symptoms: Secondary | ICD-10-CM | POA: Insufficient documentation

## 2017-11-10 DIAGNOSIS — I700 Atherosclerosis of aorta: Secondary | ICD-10-CM | POA: Diagnosis not present

## 2017-11-10 DIAGNOSIS — R918 Other nonspecific abnormal finding of lung field: Secondary | ICD-10-CM | POA: Diagnosis not present

## 2017-11-10 DIAGNOSIS — K5730 Diverticulosis of large intestine without perforation or abscess without bleeding: Secondary | ICD-10-CM | POA: Diagnosis not present

## 2017-11-10 DIAGNOSIS — I2510 Atherosclerotic heart disease of native coronary artery without angina pectoris: Secondary | ICD-10-CM | POA: Diagnosis not present

## 2017-11-10 DIAGNOSIS — I77811 Abdominal aortic ectasia: Secondary | ICD-10-CM | POA: Diagnosis not present

## 2017-11-10 MED ORDER — IOPAMIDOL (ISOVUE-370) INJECTION 76%
INTRAVENOUS | Status: AC
  Filled 2017-11-10: qty 100

## 2017-11-10 MED ORDER — IOPAMIDOL (ISOVUE-370) INJECTION 76%
100.0000 mL | Freq: Once | INTRAVENOUS | Status: AC | PRN
  Administered 2017-11-10: 100 mL via INTRAVENOUS

## 2017-11-11 ENCOUNTER — Encounter: Payer: Self-pay | Admitting: Thoracic Surgery (Cardiothoracic Vascular Surgery)

## 2017-11-11 ENCOUNTER — Institutional Professional Consult (permissible substitution): Payer: Medicare Other | Admitting: Thoracic Surgery (Cardiothoracic Vascular Surgery)

## 2017-11-11 VITALS — BP 132/76 | HR 70 | Resp 20 | Ht 68.0 in | Wt 192.0 lb

## 2017-11-11 DIAGNOSIS — I350 Nonrheumatic aortic (valve) stenosis: Secondary | ICD-10-CM

## 2017-11-11 DIAGNOSIS — Q231 Congenital insufficiency of aortic valve: Secondary | ICD-10-CM

## 2017-11-11 NOTE — Patient Instructions (Addendum)
Avoid all strenuous activity and avoid excessive heat between now and the time of surgery  Stop taking aspirin 2 weeks prior to your surgery  Continue taking all other medications without change through the day before surgery.  Have nothing to eat or drink after midnight the night before surgery.  On the morning of surgery take only Synthroid and Metoprolol with a sip of water.

## 2017-11-11 NOTE — Progress Notes (Signed)
[facility]  MULTIDISCIPLINARY HEART VALVE CLINIC  CARDIOTHORACIC SURGERY CONSULTATION REPORT  Referring Provider is Bettina Gavia, Hilton Cork, MD PCP is Colon Branch, MD  Chief Complaint  Patient presents with  . Aortic Stenosis    Surgical eval, Cardiac Cath 10/16/17, ECHO 09/08/17, Cardiac CT 11/10/17, CTA C/A/P 11/10/17    HPI:  Patient is a 72 year old male with history of aortic stenosis, hypertension, hyperlipidemia, and hypothyroidism who has been referred for surgical consultation to discuss treatment options for management of severe symptomatic aortic stenosis.  Patient states that he was first noted to have a heart murmur on routine physical exam more than 15 years ago.  He has been followed regularly by his primary care physician, Dr. Kathlene November.  Echocardiogram performed in 2011 reportedly revealed mild aortic stenosis with normal left ventricular function.  Over the past 6 months the patient has developed progressive symptoms of exertional shortness of breath and fatigue.  Follow-up transthoracic echocardiogram performed September 08, 2017 revealed severe aortic stenosis with preserved left ventricular systolic function.  Peak velocity across aortic valve was reported 4.1 m/s corresponding to mean transvalvular gradient estimated 39 mmHg.  The DVI was reported 0.22 with aortic valve area calculated 0.69 cm.  Patient was referred for cardiology consultation and evaluated recently by Dr. Bettina Gavia.  He was subsequently referred to the multidisciplinary heart valve clinic and underwent diagnostic cardiac catheterization by Dr. Burt Knack on Oct 16, 2017.  Catheterization revealed widely patent coronary arteries with no significant coronary artery disease.  There are normal right-sided pressures.  CT angiography was performed and the patient was referred for elective surgical consultation.  The patient is married and lives locally in [place] with his wife.  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  He has remained entirely functionally independent and reasonably active physically although he admits that he does not exercise regularly.  He states that he has not had any significant physical problems until over the past 6 months when he has developed progressive symptoms of exertional shortness of breath and fatigue.  He now gets short of breath intermittently with relatively low level activity and occasionally if he tries to lay flat in bed.  He has had frequent dizzy spells without any syncopal episodes.  He denies any history of chest pain or chest tightness either with activity or at rest.  He has had some mild lower extremity edema.  Past Medical History:  Diagnosis Date  . Abnormal LFTs   . Aortic stenosis 04/15/2011  . Dizziness 04/21/2014  . Hyperglycemia 10/20/2014  . Hyperlipidemia   . Hypertension   . Hypothyroidism   . Macular edema    Dr  Sherlynn Stalls     Past Surgical History:  Procedure Laterality Date  . CARDIAC CATHETERIZATION  11/10/00  . CHOLECYSTECTOMY    . INGUINAL HERNIA REPAIR     (B)  . POLYPECTOMY    . RIGHT/LEFT HEART CATH AND CORONARY ANGIOGRAPHY N/A 10/16/2017   Procedure: RIGHT/LEFT HEART CATH AND CORONARY ANGIOGRAPHY;  Surgeon: [name], MD;  Location: [facility];  Service: Cardiovascular;  Laterality: N/A;  . TONSILLECTOMY      Family History  Problem Relation Age of Onset  . Diabetes Mother   . Stroke Mother   . Diabetes Brother        ?  . Coronary artery disease Brother        Male 1st  degree relative >50, had CABG in his 72s  . Emphysema Father   . COPD Father   . Colon cancer Neg Hx   . Prostate cancer Neg Hx   . Colon polyps Neg Hx   . Esophageal cancer Neg Hx   . Rectal cancer Neg Hx   . Stomach cancer Neg Hx     Social History   Socioeconomic History  . Marital status: Married    Spouse name: Not on file  . Number of children: 3  .  Years of education: Not on file  . Highest education level: Not on file  Occupational History  . Occupation: retired- used to work for [employer]: 05/2008  Social Needs  . Financial resource strain: Not on file  . Food insecurity:    Worry: Not on file    Inability: Not on file  . Transportation needs:    Medical: Not on file    Non-medical: Not on file  Tobacco Use  . Smoking status: Former Smoker    Last attempt to quit: 04/15/1990    Years since quitting: 27.5  . Smokeless tobacco: Never Used  . Tobacco comment: used to smoke 2 ppd  Substance and Sexual Activity  . Alcohol use: No    Comment: no ETOH since the 90s  . Drug use: No  . Sexual activity: Not on file  Lifestyle  . Physical activity:    Days per week: Not on file    Minutes per session: Not on file  . Stress: Not on file  Relationships  . Social connections:    Talks on phone: Not on file    Gets together: Not on file    Attends religious service: Not on file    Active member of club or organization: Not on file    Attends meetings of clubs or organizations: Not on file    Relationship status: Not on file  . Intimate partner violence:    Fear of current or ex partner: Not on file    Emotionally abused: Not on file    Physically abused: Not on file    Forced sexual activity: Not on file  Other Topics Concern  . Not on file  Social History Narrative   Lives w/ wife   3 children, 7 g-kids              Current Outpatient Medications  Medication Sig Dispense Refill  . aspirin 81 MG tablet Take 81 mg by mouth daily.      Marland Kitchen levothyroxine (SYNTHROID, LEVOTHROID) 88 MCG tablet TAKE 1 TABLET BY MOUTH EVERY MORNING BEFORE BREAKFAST 30 tablet 6  . losartan-hydrochlorothiazide (HYZAAR) 100-12.5 MG tablet Take 1 tablet by mouth daily. 30 tablet 5  . metoprolol tartrate (LOPRESSOR) 50 MG tablet TAKE 1 TABLET (50 MG TOTAL) BY MOUTH 2 (TWO) TIMES DAILY. 60 tablet 6  . NIFEdipine  (PROCARDIA-XL/ADALAT-CC/NIFEDICAL-XL) 30 MG 24 hr tablet TAKE 1 TABLET BY MOUTH DAILY 30 tablet 6  . rosuvastatin (CRESTOR) 20 MG tablet TAKE 1 TABLET BY MOUTH DAILY 30 tablet 6  . tobramycin (TOBREX) 0.3 % ophthalmic solution PUT 1 DROP IN RIGHT EYE 4X DAILY, BEGIN 1 DAY PRIOR TO TREATMENT,DAY OF, & DAY AFTER  5   No current facility-administered medications for this visit.     Allergies  Allergen Reactions  . Atorvastatin Other (See Comments)    REACTION: myalgia  . Ezetimibe Other (See Comments)    REACTION: myalgia  Review of Systems:   General:  normal appetite, decreased energy, no weight gain, no weight loss, no fever  Cardiac:  no chest pain with exertion, no chest pain at rest, + SOB with exertion, no resting SOB, no PND, + orthopnea, no palpitations, no arrhythmia, no atrial fibrillation, + LE edema, + dizzy spells, no syncope  Respiratory:  + exertional shortness of breath, no home oxygen, no productive cough, + dry cough, no bronchitis, no wheezing, no hemoptysis, no asthma, no pain with inspiration or cough, no sleep apnea, no CPAP at night  GI:   no difficulty swallowing, no reflux, no frequent heartburn, no hiatal hernia, no abdominal pain, no constipation, no diarrhea, no hematochezia, no hematemesis, no melena  GU:   no dysuria,  + frequency, no urinary tract infection, no hematuria, no enlarged prostate, no kidney stones, no kidney disease  Vascular:  no pain suggestive of claudication, + pain in feet, + leg cramps, no varicose veins, no DVT, no non-healing foot ulcer  Neuro:   no stroke, no TIA's, no seizures, no headaches, no temporary blindness one eye,  no slurred speech, no peripheral neuropathy, no chronic pain, no instability of gait, no memory/cognitive dysfunction  Musculoskeletal: no arthritis, no joint swelling, no myalgias, no difficulty walking, normal mobility   Skin:   no rash, no itching, no skin infections, no pressure sores or  ulcerations  Psych:   no anxiety, no depression, no nervousness, no unusual recent stress  Eyes:   + blurry vision, no floaters, + recent vision changes, + wears glasses or contacts  ENT:   no hearing loss, no loose or painful teeth, full set dentures  Hematologic:  + easy bruising, no abnormal bleeding, no clotting disorder, no frequent epistaxis  Endocrine:  no diabetes, does not check CBG's at home           Physical Exam:   BP 132/76   Pulse 70   Resp 20   Ht 5\' 8"  (1.727 m)   Wt 192 lb (87.1 kg)   SpO2 97% Comment: RA  BMI 29.19 kg/m   General:    well-appearing  HEENT:  Unremarkable   Neck:   no JVD, no bruits, no adenopathy   Chest:   clear to auscultation, symmetrical breath sounds, no wheezes, no rhonchi   CV:   RRR, grade IV/VI crescendo/decrescendo murmur heard best at RSB,  no diastolic murmur  Abdomen:  soft, non-tender, no masses   Extremities:  warm, well-perfused, pulses diminished but palpable, no LE edema  Rectal/GU  Deferred  Neuro:   Grossly non-focal and symmetrical throughout  Skin:   Clean and dry, no rashes, no breakdown   Diagnostic Tests:  Transthoracic Echocardiography  Patient:    Eric [name], [place] MR #:       [id number] Study Date: 09/08/2017 Gender:     M Age:        51 Height:     172.7 cm Weight:     86.4 kg BSA:        2.06 m^2 Pt. Status: Room:   ATTENDING    Dorris Carnes, M.D.  ORDERING     [facility] [place], [facility]  cc:  ------------------------------------------------------------------- LV EF: 55% -   60%  ------------------------------------------------------------------- Indications:      I35.0 Aortic valve stenosis.  ------------------------------------------------------------------- History:   Risk factors:  Hypertension. Dyslipidemia.  ------------------------------------------------------------------- Study Conclusions  -  Left  ventricle: The cavity size was normal. Wall thickness was   normal. Systolic function was normal. The estimated ejection   fraction was in the range of 55% to 60%. - Aortic valve: AV is thickened, calcified with restricted motion   Peak and mean gradients through the valve are 68 and 43 mm Hg   respectively consistent with severe AS. There was mild   regurgitation. - Mitral valve: There was mild regurgitation.  Impressions:  - Compared to echo from 2017, mean gradient is increased (23 to 43   mm Hg) AS now severe.  ------------------------------------------------------------------- Study data:  Comparison was made to the study of 06/27/2015.  Study status:  Routine.  Procedure:  The patient reported no pain pre or post test. Transthoracic echocardiography. Image quality was adequate.  Study completion:  There were no complications. Transthoracic echocardiography.  M-mode, complete 2D, spectral Doppler, and color Doppler.  Birthdate:  Patient birthdate: 1946-05-31.  Age:  Patient is 72 yr old.  Sex:  Gender: male. BMI: 29 kg/m^2.  Blood pressure:     134/77  Patient status: Outpatient.  Study date:  Study date: 09/08/2017. Study time: 01:11 PM.  Location:  [facility]  -------------------------------------------------------------------  ------------------------------------------------------------------- Left ventricle:  The cavity size was normal. Wall thickness was normal. Systolic function was normal. The estimated ejection fraction was in the range of 55% to 60%.  ------------------------------------------------------------------- Aortic valve:  AV is thickened, calcified with restricted motion Peak and mean gradients through the valve are 68 and 43 mm Hg respectively consistent with severe AS.  Doppler:  There was mild regurgitation.    VTI ratio of LVOT to aortic valve: 0.23. Valve area (VTI): 0.74 cm^2. Indexed valve area (VTI): 0.36 cm^2/m^2. Peak velocity  ratio of LVOT to aortic valve: 0.22. Valve area (Vmax): 0.69 cm^2. Indexed valve area (Vmax): 0.34 cm^2/m^2. Mean velocity ratio of LVOT to aortic valve: 0.21. Valve area (Vmean): 0.67 cm^2. Indexed valve area (Vmean): 0.32 cm^2/m^2.    Mean gradient (S): 39 mm Hg. Peak gradient (S): 68 mm Hg.  ------------------------------------------------------------------- Aorta:  The aorta was normal, not dilated, and non-diseased.  ------------------------------------------------------------------- Mitral valve:   Mildly thickened leaflets .  Doppler:  There was mild regurgitation.    Valve area by pressure half-time: 3.33 cm^2. Indexed valve area by pressure half-time: 1.62 cm^2/m^2.    Peak gradient (D): 3 mm Hg.  ------------------------------------------------------------------- Left atrium:  The atrium was normal in size.  ------------------------------------------------------------------- Right ventricle:  The cavity size was normal. Wall thickness was normal. Systolic function was normal.  ------------------------------------------------------------------- Pulmonic valve:    Structurally normal valve.   Cusp separation was normal.  Doppler:  Transvalvular velocity was within the normal range. There was no regurgitation.  ------------------------------------------------------------------- Tricuspid valve:   Structurally normal valve.   Leaflet separation was normal.  Doppler:  Transvalvular velocity was within the normal range. There was mild regurgitation.  ------------------------------------------------------------------- Right atrium:  The atrium was normal in size.  ------------------------------------------------------------------- Pericardium:  There was no pericardial effusion.   ------------------------------------------------------------------- Post procedure conclusions Ascending Aorta:  - The aorta was normal, not dilated, and  non-diseased.  ------------------------------------------------------------------- Measurements   Left ventricle                           Value          Reference  LV ID, ED, PLAX chordal  43    mm       43 - 52  LV ID, ES, PLAX chordal                  29    mm       23 - 38  LV fx shortening, PLAX chordal           33    %        >=29  LV PW thickness, ED                      11    mm       ----------  IVS/LV PW ratio, ED                      0.91           <=1.3  Stroke volume, 2D                        75    ml       ----------  Stroke volume/bsa, 2D                    36    ml/m^2   ----------  LV e&', lateral                           9.57  cm/s     ----------  LV E/e&', lateral                         8.57           ----------  LV e&', medial                            6.09  cm/s     ----------  LV E/e&', medial                          13.46          ----------  LV e&', average                           7.83  cm/s     ----------  LV E/e&', average                         10.47          ----------    Ventricular septum                       Value          Reference  IVS thickness, ED                        10    mm       ----------    LVOT                                     Value          Reference  LVOT ID, S  20    mm       ----------  LVOT area                                3.14  cm^2     ----------  LVOT peak velocity, S                    90.2  cm/s     ----------  LVOT mean velocity, S                    61.9  cm/s     ----------  LVOT VTI, S                              23.9  cm       ----------    Aortic valve                             Value          Reference  Aortic valve peak velocity, S            412   cm/s     ----------  Aortic valve mean velocity, S            291   cm/s     ----------  Aortic valve VTI, S                      102   cm       ----------  Aortic mean gradient, S                  39    mm Hg     ----------  Aortic peak gradient, S                  68    mm Hg    ----------  VTI ratio, LVOT/AV                       0.23           ----------  Aortic valve area, VTI                   0.74  cm^2     ----------  Aortic valve area/bsa, VTI               0.36  cm^2/m^2 ----------  Velocity ratio, peak, LVOT/AV            0.22           ----------  Aortic valve area, peak velocity         0.69  cm^2     ----------  Aortic valve area/bsa, peak              0.34  cm^2/m^2 ----------  velocity  Velocity ratio, mean, LVOT/AV            0.21           ----------  Aortic valve area, mean velocity         0.67  cm^2     ----------  Aortic valve area/bsa, mean              0.32  cm^2/m^2 ----------  velocity  Aortic regurg peak velocity              270   cm/s     ----------  Aortic regurg pressure half-time         609   ms       ----------  Aortic regurg peak gradient              29    mm Hg    ----------    Aorta                                    Value          Reference  Aortic root ID, ED                       27    mm       ----------    Left atrium                              Value          Reference  LA ID, A-P, ES                           35    mm       ----------  LA ID/bsa, A-P                           1.7   cm/m^2   <=2.2  LA volume, S                             32    ml       ----------  LA volume/bsa, S                         15.6  ml/m^2   ----------  LA volume, ES, 1-p A4C                   32.6  ml       ----------  LA volume/bsa, ES, 1-p A4C               15.8  ml/m^2   ----------  LA volume, ES, 1-p A2C                   31.6  ml       ----------  LA volume/bsa, ES, 1-p A2C               15.4  ml/m^2   ----------    Mitral valve                             Value          Reference  Mitral E-wave peak velocity              82    cm/s     ----------  Mitral A-wave peak velocity              82    cm/s     ----------  Mitral deceleration time  218   ms        150 - 230  Mitral peak gradient, D                  3     mm Hg    ----------  Mitral E/A ratio, peak                   1              ----------  Mitral valve area, PHT, DP               3.33  cm^2     ----------  Mitral valve area/bsa, PHT, DP           1.62  cm^2/m^2 ----------    Pulmonary arteries                       Value          Reference  PA pressure, S, DP                       29    mm Hg    <=30    Tricuspid valve                          Value          Reference  Tricuspid regurg peak velocity           253   cm/s     ----------  Tricuspid peak RV-RA gradient            26    mm Hg    ----------    Right atrium                             Value          Reference  RA ID, S-I, ES, A4C              (H)     51.3  mm       34 - 49  RA area, ES, A4C                         11.6  cm^2     8.3 - 19.5  RA volume, ES, A/L                       22.5  ml       ----------  RA volume/bsa, ES, A/L                   10.9  ml/m^2   ----------    Systemic veins                           Value          Reference  Estimated CVP                            3     mm Hg    ----------    Right ventricle                          Value  Reference  RV pressure, S, DP                       29    mm Hg    <=30  RV s&', lateral, S                        8.38  cm/s     ----------  Legend: (L)  and  (H)  mark values outside specified reference range.  ------------------------------------------------------------------- Prepared and Electronically Authenticated by  Dorris Carnes, M.D. 2019-04-01T19:32:07   RIGHT/LEFT HEART CATH AND CORONARY ANGIOGRAPHY  Conclusion   1. Known severe aortic stenosis by noninvasive assessment 2. Widely patent coronary arteries with minor luminal irregularity (right dominant) 3. Normal right-sided intracardiac pressures  Plan: CTA studies, cardiac surgical evaluation for aortic valve replacement  Indications   Severe aortic stenosis [I35.0  (ICD-10-CM)]  Procedural Details/Technique   Technical Details INDICATION: Severe symptomatic aortic stenosis. 72 yo male with bicuspid aortic valve who has developed severe, Stage D1, aortic stenosis with exertional lightheadedness and dyspnea. Echo shows peak and mean transvalvular gradients of 68 and 43 mmHg demonstrating progression of aortic stenosis into the severe range associated with classic symptoms.   PROCEDURAL DETAILS: The right wrist was prepped, draped, and anesthetized with 1% lidocaine. Using the modified Seldinger technique, a 5/6 French Slender sheath was introduced into the right radial artery. 3 mg of verapamil was administered through the sheath, weight-based unfractionated heparin was administered intravenously. Standard Judkins catheters were used for selective coronary angiography. Attempts were not made to cross the aortic valve. Catheter exchanges were performed over an exchange length guidewire. There were no immediate procedural complications. A TR band was used for radial hemostasis at the completion of the procedure. The patient was transferred to the post catheterization recovery area for further monitoring.   Estimated blood loss <50 mL.  During this procedure the patient was administered the following to achieve and maintain moderate conscious sedation: Versed 2 mg, Fentanyl 50 mcg, while the patient's heart rate, blood pressure, and oxygen saturation were continuously monitored. The period of conscious sedation was 23 minutes, of which I was present face-to-face 100% of this time.  Coronary Findings   Diagnostic  Dominance: Right  Left Main  Vessel is angiographically normal.  Left Anterior Descending  The vessel exhibits minimal luminal irregularities. The LAD wraps the LV apex. The vessel has minor irregularity and is patent throughout.  Ramus Intermedius  The vessel exhibits minimal luminal irregularities.  Left Circumflex  The vessel exhibits minimal  luminal irregularities.  Right Coronary Artery  Vessel is moderate in size. Vessel is angiographically normal. Dominant RCA, no significant stenosis  Intervention   No interventions have been documented.  Left Heart   Aortic Valve There is severe aortic valve stenosis. The aortic valve is calcified. There is restricted aortic valve motion. Known severe AS by echo. The valve is heavily calcified and restricted on plane fluoroscopy. The valve is not crossed during the procedure.  Coronary Diagrams   Diagnostic Diagram       Implants    No implant documentation for this case.  MERGE Images   Show images for CARDIAC CATHETERIZATION   Link to Procedure Log   Procedure Log    Hemo Data    Most Recent Value  Fick Cardiac Output 5.61 L/min  Fick Cardiac Output Index 2.76 (L/min)/BSA  RA A Wave 11 mmHg  RA V Wave 8 mmHg  RA Mean 7 mmHg  RV Systolic Pressure 32 mmHg  RV Diastolic Pressure 7 mmHg  RV EDP 11 mmHg  PA Systolic Pressure 34 mmHg  PA Diastolic Pressure 13 mmHg  PA Mean 20 mmHg  PW A Wave 22 mmHg  PW V Wave 20 mmHg  PW Mean 17 mmHg  AO Systolic Pressure 751 mmHg  AO Diastolic Pressure 63 mmHg  AO Mean 85 mmHg  QP/QS 1  TPVR Index 7.24 HRUI  TSVR Index 30.74 HRUI  PVR SVR Ratio 0.04  TPVR/TSVR Ratio 0.24    Cardiac TAVR CT  TECHNIQUE: The patient was scanned on a Siemens 192 scanner. A 120 kV retrospective scan was triggered in the descending thoracic aorta at 111 HU's. Gantry rotation speed was 270 msecs and collimation was .9 mm. No beta blockade or nitro were given. The 3D data set was reconstructed in 5% intervals of the R-R cycle. Systolic and diastolic phases were analyzed on a dedicated work station using MPR, MIP and VRT modes. The patient received 80 cc of contrast.  FINDINGS: Aortic Valve: Tri leaflet and calcified with restricted leaflet motion  Aorta: Non aneurysmal with mild calcific atherosclerosis Arch not visualized on this  study  Sino-tubular Junction: 25.5 mm  Ascending Thoracic Aorta: 28 mm  Sinus of Valsalva Measurements:  Non-coronary: 28.2 mm  Right  -coronary: 27 mm  Left - coronary: 28.4 mm  Coronary Artery Height above Annulus:  Left Main: 14.4 mm above annulus  Right Coronary: 16.5 mm above annulus  Virtual Basal Annulus Measurements:  Maximum/Minimum Diameter: 27.1 mm x 22.2 mm  Perimeter: 77.7 mm  Area: 449 mm2  Coronary Arteries: Sufficient height above annulus for deployment  Optimum Fluoroscopic Angle for Delivery: LAO 10 degrees Caudal 2 degrees  IMPRESSION: 1. Aortic Stenosis with tri leaflet valve annulus of 449 mm2 suitable for a 26 mm Sapien 3 valve  2. Normal aortic root with no aneurysm and mild atherosclerosis 2.8 cm  3. Optimum angiographic angle for deployment LAO 10 Caudal 2 degrees  4.  Coronary arteries sufficient height above annulus for deployment  5.  No LAA thrombus  Jenkins Rouge   Electronically Signed   By: Jenkins Rouge M.D.   On: 11/10/2017 12:06       CTA ABDOMEN AND PELVIS WITHOUT AND WITH CONTRAST  TECHNIQUE: Multidetector CT imaging of the abdomen and pelvis was performed using the standard protocol during bolus administration of intravenous contrast. Multiplanar reconstructed images and MIPs were obtained and reviewed to evaluate the vascular anatomy.  CONTRAST:  100 cc ISOVUE-370 IOPAMIDOL (ISOVUE-370) INJECTION 76%, 168mL ISOVUE-370 IOPAMIDOL (ISOVUE-370) INJECTION 76%  COMPARISON:  09/30/2017 chest radiograph.  FINDINGS: CTA CHEST FINDINGS  Cardiovascular: Normal heart size. No significant pericardial effusion/thickening. Three-vessel coronary atherosclerosis. Severe thickening and calcification of the aortic valve. Atherosclerotic nonaneurysmal thoracic aorta. Normal caliber pulmonary arteries. No central pulmonary emboli.  Mediastinum/Nodes: Subcentimeter hypodense right thyroid  lobe nodule. Unremarkable esophagus. No pathologically enlarged axillary, mediastinal or hilar lymph nodes.  Lungs/Pleura: No pneumothorax. No pleural effusion. No acute consolidative airspace disease or lung masses. Two tiny solid pulmonary nodules, largest 3 mm in the left upper lobe (series 16/image 48).  Musculoskeletal: No aggressive appearing focal osseous lesions. Moderate thoracic spondylosis.  CTA ABDOMEN AND PELVIS FINDINGS  Hepatobiliary: Normal liver size. A few scattered subcentimeter hypervascular liver foci (for example series 15/image 65 at the liver dome) are too small to characterize. No additional liver lesions. Cholecystectomy. Bile ducts are within normal post cholecystectomy limits.  CBD diameter 8 mm.  Pancreas: Normal, with no mass or duct dilation.  Spleen: Normal size. No mass.  Adrenals/Urinary Tract: Normal adrenals. Subcentimeter hypodense renal cortical lesion in the anterior upper left kidney is too small to characterize. Otherwise normal kidneys, with no hydronephrosis. Normal bladder.  Stomach/Bowel: Normal non-distended stomach. Normal caliber small bowel with no small bowel wall thickening. Normal visualized appendix. Scattered mild colonic diverticulosis, most prominent in the proximal sigmoid colon, with no large bowel wall thickening or pericolonic fat stranding.  Vascular/Lymphatic: Atherosclerotic abdominal aorta with ectatic 2.8 cm infrarenal abdominal aorta. Retroaortic left renal vein. No pathologically enlarged lymph nodes in the abdomen or pelvis.  Reproductive: Mildly enlarged prostate with nonspecific internal prostatic calcifications.  Other: No pneumoperitoneum, ascites or focal fluid collection. Small fat containing right inguinal hernia.  Musculoskeletal: No aggressive appearing focal osseous lesions. Mild lumbar spondylosis.  VASCULAR MEASUREMENTS PERTINENT TO TAVR:  AORTA:  Minimal Aortic  Diameter-13.8 x 13.7 mm  Severity of Aortic Calcification-moderate  RIGHT PELVIS:  Right Common Iliac Artery -  Minimal Diameter-7.7 x 7.7 mm  Tortuosity-mild  Calcification-mild  Right External Iliac Artery -  Minimal Diameter-7.4 x 7.3 mm  Tortuosity-moderate  Calcification-mild  Right Common Femoral Artery -  Minimal Diameter-8.2 x 8.0 mm  Tortuosity-mild  Calcification-mild  LEFT PELVIS:  Left Common Iliac Artery -  Minimal Diameter-8.2 x 6.8 mm  Tortuosity-moderate  Calcification-moderate  Left External Iliac Artery -  Minimal Diameter-7.5 x 7.5 mm  Tortuosity-mild  Calcification-mild  Left Common Femoral Artery -  Minimal Diameter-7.1 x 5.5 mm  Tortuosity-mild  Calcification-mild  Review of the MIP images confirms the above findings.  IMPRESSION: 1. Vascular findings and measurements pertinent to potential TAVR procedure, as detailed above. 2. Severe thickening and calcification of the aortic valve, compatible with the reported clinical history of severe aortic stenosis. 3. Tiny scattered solid pulmonary nodules, largest 3 mm. No follow-up needed if patient is low-risk (and has no known or suspected primary neoplasm). Non-contrast chest CT can be considered in 12 months if patient is high-risk. This recommendation follows the consensus statement: Guidelines for Management of Incidental Pulmonary Nodules Detected on CT Images:From the Fleischner Society 2017; published online before print (10.1148/radiol.[id number]). 4. Three-vessel coronary atherosclerosis. 5. Aortic Atherosclerosis (ICD10-I70.0). 6. 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 Recommend follow-up aortic ultrasound in 5 years. This recommendation follows ACR consensus guidelines: White Paper of the ACR Incidental Findings Committee II on Vascular Findings. J Am Coll Radiol 2013; 10:789-794. 7. Subcentimeter  hypervascular liver foci, too small to characterize, probably benign transient foci of enhancement, for which no follow-up is required unless the patient has risk factors for liver malignancy, in which case a follow-up MRI abdomen without and with IV contrast may be performed in 3-6 months. This recommendation follows ACR consensus guidelines: Managing Incidental Findings on Abdominal CT: White Paper of the ACR Incidental Findings Committee. J Am Coll Radiol 2010;7:754-773. 8. Mild colonic diverticulosis. 9. Mildly enlarged prostate.   Electronically Signed   By: Ilona Sorrel M.D.   On: 11/10/2017 12:15    STS Risk Calculator  Procedure: Isolated AVR CALCULATE   Risk of Mortality:  0.727% Renal Failure:  0.769% Permanent Stroke:  0.673% Prolonged Ventilation:  3.577% DSW Infection:  0.108% Reoperation:  3.050% Morbidity or Mortality:  6.485% Short Length of Stay:  60.054% Long Length of Stay:  2.065%     Impression:  Patient has stage D severe symptomatic aortic stenosis.  He presents with  progressive symptoms of exertional shortness of breath and fatigue consistent with chronic diastolic congestive heart failure, New York Heart Association functional class IIb.  He has also been having some intermittent dizzy spells without syncope.  I have personally reviewed the patient's recent transthoracic echocardiogram, diagnostic cardiac catheterization, and CT angiograms.  Echocardiogram reveals normal left ventricular systolic function with severe aortic stenosis.  The aortic valve appears trileaflet with severe thickening, calcification, and restricted leaflet mobility involving all 3 leaflets.  Peak velocity across aortic valve measured greater than 4.1 m/s corresponding to mean transvalvular gradient estimated 39 mmHg and aortic valve area calculated less than 0.7 cm.  Diagnostic cardiac catheterization revealed normal coronary artery anatomy with no significant  coronary artery disease.  Right-sided pressures were normal.  There is no question the patient needs aortic valve replacement.  Risks associated with conventional surgery should be relatively low.  CT angiography reveals normal-appearing ascending thoracic aorta with normal size aortic root and no contraindications to peripheral arterial cannulation for surgery.   Plan:  The patient and his family were counseled at length regarding treatment alternatives for management of severe aortic stenosis including continued medical therapy versus proceeding with aortic valve replacement in the near future.  The natural history of aortic stenosis was reviewed, as was long term prognosis with medical therapy alone.  Surgical options were discussed at length including conventional surgical aortic valve replacement through either a full median sternotomy or using minimally invasive techniques.  Other alternatives including rapid-deployment bioprosthetic tissue valve replacement, transcatheter aortic valve replacement, patch enlargement of the aortic root, and stentless porcine aortic root replacement were discussed.  Current indications for use of transcatheter aortic valve replacement were explained at length.  Discussion was held comparing the relative risks of mechanical valve replacement with need for lifelong anticoagulation versus use of a bioprosthetic tissue valve and the associated potential for late structural valve deterioration and failure.  This discussion was placed in the context of the patient's particular circumstances, and as a result the patient specifically requests that their valve be replaced using a bioprosthetic tissue valve .  The potential advantages and disadvantages associated with use of a rapid-deployment bioprosthetic aortic valve were discussed, including the risks of paravalvular leak, need for permanent pacemaker placement, and expectations for long-term durability.  The patient understands  and accepts all potential associated risks of surgery including but not limited to risk of death, stroke, myocardial infarction, congestive heart failure, respiratory failure, renal failure, pneumonia, bleeding requiring blood transfusion and or reexploration, arrhythmia, heart block or bradycardia requiring permanent pacemaker, aortic dissection or other major vascular complication, pleural effusions or other delayed complications related to continued congestive heart failure, and other late complications related to valve replacement including structural valve deterioration and failure, thrombosis, endocarditis, or paravalvular leak.  We tentatively plan to proceed with aortic valve replacement using a stented bioprosthetic tissue valve via right minithoracotomy approach on December 17, 2017.  Patient will return to our office for follow-up prior to surgery on December 15, 2017.  All questions answered.  I spent in excess of 90 minutes during the conduct of this office consultation and >50% of this time involved direct face-to-face encounter with the patient for counseling and/or coordination of their care.    Valentina Gu. Roxy Manns, MD 11/11/2017 2:03 PM

## 2017-11-12 ENCOUNTER — Other Ambulatory Visit: Payer: Self-pay | Admitting: *Deleted

## 2017-11-12 DIAGNOSIS — I350 Nonrheumatic aortic (valve) stenosis: Secondary | ICD-10-CM

## 2017-12-08 DIAGNOSIS — Z8679 Personal history of other diseases of the circulatory system: Secondary | ICD-10-CM

## 2017-12-08 DIAGNOSIS — R399 Unspecified symptoms and signs involving the genitourinary system: Secondary | ICD-10-CM | POA: Insufficient documentation

## 2017-12-08 HISTORY — DX: Personal history of other diseases of the circulatory system: Z86.79

## 2017-12-12 NOTE — Pre-Procedure Instructions (Signed)
Eric [name]  12/12/2017    Your procedure is scheduled on Wednesday, July 10.  Report to [facility] Admitting at 8:30 AM                 Your surgery or procedure is scheduled for 8:30 AM   Call this number if you have problems the morning of surgery:[phone] pre- op desk   Remember:  Do not eat or drink after midnight Tuesday, July 9.  Take these medicines the morning of surgery with A SIP OF WATER : levothyroxine (SYNTHROID, LEVOTHROID) metoprolol tartrate (LOPRESSOR)   NIFEdipine (PROCARDIA-XL/ADALAT-CC/NIFEDICAL-XL)  Aspirin per Dr Eric [name] instructions.  STOP taking  Aspirin Products (Goody Powder, Excedrin Migraine), Ibuprofen (Advil), Naproxen (Aleve), Vitamins and Herbal Products (ie Fish Oil) Special instructions:   [name]- Preparing For Surgery  Before surgery, you can play an important role. Because skin is not sterile, your skin needs to be as free of germs as possible. You can reduce the number of germs on your skin by washing with CHG (chlorahexidine gluconate) Soap before surgery.  CHG is an antiseptic cleaner which kills germs and bonds with the skin to continue killing germs even after washing.    Oral Hygiene is also important to reduce your risk of infection.  Remember - BRUSH YOUR TEETH THE MORNING OF SURGERY WITH YOUR REGULAR TOOTHPASTE  Please do not use if you have an allergy to CHG or antibacterial soaps. If your skin becomes reddened/irritated stop using the CHG.  Do not shave (including legs and underarms) for at least 48 hours prior to first CHG shower. It is OK to shave your face.  Please follow these instructions carefully.   1. Shower the NIGHT BEFORE SURGERY and the MORNING OF SURGERY with CHG.   2. If you chose to wash your hair, wash your hair first as usual with your normal shampoo.  3. After you shampoo, rinse your hair and body thoroughly to remove the shampoo.    Wash your face and private area with the soap you use at  home, then rinse. 4. Use CHG as you would any other liquid soap. You can apply CHG directly to the skin and wash gently with a scrungie or a clean washcloth.   5. Apply the CHG Soap to your body ONLY FROM THE NECK DOWN.  Do not use on open wounds or open sores. Avoid contact with your eyes, ears, mouth and genitals (private parts).   6. Wash thoroughly, paying special attention to the area where your surgery will be performed.  7. Thoroughly rinse your body with warm water from the neck down.  8. DO NOT shower/wash with your normal soap after using and rinsing off the CHG Soap.  9. Pat yourself dry with a CLEAN TOWEL.  10. Wear CLEAN PAJAMAS to bed the night before surgery, wear comfortable clothes the morning of surgery  11. Place CLEAN SHEETS on your bed the night of your first shower and DO NOT SLEEP WITH PETS.  Day of Surgery: Shower as above Do not apply any deodorants/lotions, powders or colognes..  Please wear clean clothes to the hospital/surgery center.   Remember to brush your teeth WITH YOUR REGULAR TOOTHPASTE.  Do not wear jewelry, make-up or nail polish.  Do not shave 48 hours prior to surgery.  Men may shave face and neck.  Do not bring valuables to the hospital.  [facility] is not responsible for any belongings or valuables.  Contacts, dentures  or bridgework may not be worn into surgery.  Leave your suitcase in the car.  After surgery it may be brought to your room.  For patients admitted to the hospital, discharge time will be determined by your treatment team.  Please read over the following fact sheets that you were given.  Patient Instructions for Mupirocin Application, Surgical Site Infections.

## 2017-12-12 NOTE — Pre-Procedure Instructions (Signed)
Eric [name]  12/12/2017      [id number] [place], [place] [address] [place] [address] Phone: [phone] Fax: [phone]    Your procedure is scheduled on  Wednesday 12/17/17  Report to [facility] Admitting at 630 A.M.  Call this number if you have problems the morning of surgery:  [phone]   Remember:  Do not eat or drink after midnight.     Take these medicines the morning of surgery with A SIP OF WATER- LEVOTHYROXINE, METOPROLOL(LOPRESSOR), NIFEDIPINE (PROCARDIA), EYE DROPS   7 days prior to surgery STOP taking any Aspirin(unless otherwise instructed by your surgeon), Aleve, Naproxen, Ibuprofen, Motrin, Advil, Goody's, BC's, all herbal medications, fish oil, and all vitamins   Do not wear jewelry, make-up or nail polish.  Do not wear lotions, powders, or perfumes, or deodorant.  Do not shave 48 hours prior to surgery.  Men may shave face and neck.  Do not bring valuables to the hospital.  [facility] is not responsible for any belongings or valuables.  Contacts, dentures or bridgework may not be worn into surgery.  Leave your suitcase in the car.  After surgery it may be brought to your room.  For patients admitted to the hospital, discharge time will be determined by your treatment team.  Patients discharged the day of surgery will not be allowed to drive home.   Name and phone number of your driver:    Special instructions:  [place] - Preparing for Surgery  Before surgery, you can play an important role.  Because skin is not sterile, your skin needs to be as free of germs as possible.  You can reduce the number of germs on you skin by washing with CHG (chlorahexidine gluconate) soap before surgery.  CHG is an antiseptic cleaner which kills germs and bonds with the skin to continue killing germs even after washing.  Oral Hygiene is also important in reducing the risk of infection.  Remember to brush your  teeth with your regular toothpaste the morning of surgery.  Please DO NOT use if you have an allergy to CHG or antibacterial soaps.  If your skin becomes reddened/irritated stop using the CHG and inform your nurse when you arrive at Short Stay.  Do not shave (including legs and underarms) for at least 48 hours prior to the first CHG shower.  You may shave your face.  Please follow these instructions carefully:   1.  Shower with CHG Soap the night before surgery and the morning of Surgery.  2.  If you choose to wash your hair, wash your hair first as usual with your normal shampoo.  3.  After you shampoo, rinse your hair and body thoroughly to remove the shampoo. 4.  Use CHG as you would any other liquid soap.  You can apply chg directly to the skin and wash gently with a      scrungie or washcloth.           5.  Apply the CHG Soap to your body ONLY FROM THE NECK DOWN.   Do not use on open wounds or open sores. Avoid contact with your eyes, ears, mouth and genitals (private parts).  Wash genitals (private parts) with your normal soap.  6.  Wash thoroughly, paying special attention to the area where your surgery will be performed.  7.  Thoroughly rinse your body with warm water from the neck down.  8.  DO  NOT shower/wash with your normal soap after using and rinsing off the CHG Soap.  9.  Pat yourself dry with a clean towel.            10.  Wear clean pajamas.            11.  Place clean sheets on your bed the night of your first shower and do not sleep with pets.  Day of Surgery  Do not apply any lotions/deoderants the morning of surgery.   Please wear clean clothes to the hospital/surgery center. Remember to brush your teeth with toothpaste.     Please read over the following fact sheets that you were given. MRSA Information and Surgical Site Infection Prevention

## 2017-12-15 ENCOUNTER — Other Ambulatory Visit: Payer: Self-pay

## 2017-12-15 ENCOUNTER — Encounter (HOSPITAL_COMMUNITY): Payer: Self-pay | Admitting: *Deleted

## 2017-12-15 ENCOUNTER — Ambulatory Visit (HOSPITAL_BASED_OUTPATIENT_CLINIC_OR_DEPARTMENT_OTHER)
Admission: RE | Admit: 2017-12-15 | Discharge: 2017-12-15 | Disposition: A | Discharge status: Home / Self Care | Payer: Medicare Other | Source: Ambulatory Visit | Attending: Thoracic Surgery (Cardiothoracic Vascular Surgery) | Admitting: Thoracic Surgery (Cardiothoracic Vascular Surgery)

## 2017-12-15 ENCOUNTER — Encounter: Payer: Self-pay | Admitting: Thoracic Surgery (Cardiothoracic Vascular Surgery)

## 2017-12-15 ENCOUNTER — Encounter (HOSPITAL_COMMUNITY)
Admission: RE | Admit: 2017-12-15 | Discharge: 2017-12-15 | Disposition: A | Discharge status: Home / Self Care | Payer: Medicare Other | Source: Ambulatory Visit | Attending: Thoracic Surgery (Cardiothoracic Vascular Surgery) | Admitting: Thoracic Surgery (Cardiothoracic Vascular Surgery)

## 2017-12-15 ENCOUNTER — Ambulatory Visit: Payer: Medicare Other | Admitting: Thoracic Surgery (Cardiothoracic Vascular Surgery)

## 2017-12-15 ENCOUNTER — Ambulatory Visit (HOSPITAL_COMMUNITY)
Admission: RE | Admit: 2017-12-15 | Discharge: 2017-12-15 | Disposition: A | Discharge status: Home / Self Care | Payer: Medicare Other | Source: Ambulatory Visit | Attending: Thoracic Surgery (Cardiothoracic Vascular Surgery) | Admitting: Thoracic Surgery (Cardiothoracic Vascular Surgery)

## 2017-12-15 VITALS — BP 112/71 | HR 71 | Resp 20 | Ht 68.0 in | Wt 189.0 lb

## 2017-12-15 DIAGNOSIS — I350 Nonrheumatic aortic (valve) stenosis: Secondary | ICD-10-CM

## 2017-12-15 DIAGNOSIS — Z87891 Personal history of nicotine dependence: Secondary | ICD-10-CM | POA: Insufficient documentation

## 2017-12-15 DIAGNOSIS — E039 Hypothyroidism, unspecified: Secondary | ICD-10-CM

## 2017-12-15 DIAGNOSIS — Z01818 Encounter for other preprocedural examination: Secondary | ICD-10-CM | POA: Insufficient documentation

## 2017-12-15 DIAGNOSIS — I10 Essential (primary) hypertension: Secondary | ICD-10-CM | POA: Insufficient documentation

## 2017-12-15 DIAGNOSIS — Z953 Presence of xenogenic heart valve: Secondary | ICD-10-CM | POA: Insufficient documentation

## 2017-12-15 DIAGNOSIS — E785 Hyperlipidemia, unspecified: Secondary | ICD-10-CM | POA: Insufficient documentation

## 2017-12-15 DIAGNOSIS — R942 Abnormal results of pulmonary function studies: Secondary | ICD-10-CM | POA: Insufficient documentation

## 2017-12-15 DIAGNOSIS — Z7982 Long term (current) use of aspirin: Secondary | ICD-10-CM | POA: Insufficient documentation

## 2017-12-15 DIAGNOSIS — R739 Hyperglycemia, unspecified: Secondary | ICD-10-CM | POA: Insufficient documentation

## 2017-12-15 DIAGNOSIS — Z79890 Hormone replacement therapy (postmenopausal): Secondary | ICD-10-CM

## 2017-12-15 DIAGNOSIS — F419 Anxiety disorder, unspecified: Secondary | ICD-10-CM

## 2017-12-15 DIAGNOSIS — Q231 Congenital insufficiency of aortic valve: Secondary | ICD-10-CM

## 2017-12-15 DIAGNOSIS — Z79899 Other long term (current) drug therapy: Secondary | ICD-10-CM

## 2017-12-15 LAB — PULMONARY FUNCTION TEST
DL/VA % pred: 93 %
DL/VA: 4.19 ml/min/mmHg/L
DLCO UNC % PRED: 68 %
DLCO unc: 20.85 ml/min/mmHg
FEF 25-75 Post: 2.58 L/sec
FEF 25-75 Pre: 3.12 L/sec
FEF2575-%Change-Post: -17 %
FEF2575-%PRED-POST: 115 %
FEF2575-%Pred-Pre: 139 %
FEV1-%CHANGE-POST: -1 %
FEV1-%PRED-POST: 92 %
FEV1-%Pred-Pre: 93 %
FEV1-POST: 2.76 L
FEV1-PRE: 2.8 L
FEV1FVC-%CHANGE-POST: -3 %
FEV1FVC-%Pred-Pre: 114 %
FEV6-%Change-Post: 1 %
FEV6-%PRED-PRE: 86 %
FEV6-%Pred-Post: 88 %
FEV6-PRE: 3.35 L
FEV6-Post: 3.41 L
FEV6FVC-%Change-Post: 0 %
FEV6FVC-%PRED-PRE: 106 %
FEV6FVC-%Pred-Post: 105 %
FVC-%CHANGE-POST: 2 %
FVC-%PRED-PRE: 81 %
FVC-%Pred-Post: 83 %
FVC-POST: 3.43 L
FVC-PRE: 3.35 L
POST FEV6/FVC RATIO: 99 %
PRE FEV6/FVC RATIO: 100 %
Post FEV1/FVC ratio: 80 %
Pre FEV1/FVC ratio: 83 %
RV % PRED: 94 %
RV: 2.28 L
TLC % pred: 83 %
TLC: 5.62 L

## 2017-12-15 LAB — URINALYSIS, ROUTINE W REFLEX MICROSCOPIC
Bacteria, UA: NONE SEEN
Bilirubin Urine: NEGATIVE
GLUCOSE, UA: NEGATIVE mg/dL
Ketones, ur: NEGATIVE mg/dL
Leukocytes, UA: NEGATIVE
Nitrite: NEGATIVE
PROTEIN: NEGATIVE mg/dL
SPECIFIC GRAVITY, URINE: 1.009 (ref 1.005–1.030)
pH: 6 (ref 5.0–8.0)

## 2017-12-15 LAB — COMPREHENSIVE METABOLIC PANEL
ALK PHOS: 58 U/L (ref 38–126)
ALT: 23 U/L (ref 0–44)
AST: 41 U/L (ref 15–41)
Albumin: 4.2 g/dL (ref 3.5–5.0)
Anion gap: 11 (ref 5–15)
BILIRUBIN TOTAL: 1.2 mg/dL (ref 0.3–1.2)
BUN: 20 mg/dL (ref 8–23)
CALCIUM: 9.9 mg/dL (ref 8.9–10.3)
CHLORIDE: 106 mmol/L (ref 98–111)
CO2: 24 mmol/L (ref 22–32)
CREATININE: 1.21 mg/dL (ref 0.61–1.24)
GFR, EST NON AFRICAN AMERICAN: 58 mL/min — AB (ref >60)
Glucose, Bld: 115 mg/dL — ABNORMAL HIGH (ref 70–99)
Potassium: 3.6 mmol/L (ref 3.5–5.1)
Sodium: 141 mmol/L (ref 135–145)
Total Protein: 7.6 g/dL (ref 6.5–8.1)

## 2017-12-15 LAB — BLOOD GAS, ARTERIAL
ACID-BASE EXCESS: 2.3 mmol/L — AB (ref 0.0–2.0)
Bicarbonate: 26.2 mmol/L (ref 20.0–28.0)
DRAWN BY: 4498211
FIO2: 21
O2 Saturation: 97.2 %
PH ART: 7.44 (ref 7.350–7.450)
Patient temperature: 98.6
pCO2 arterial: 39.1 mmHg (ref 32.0–48.0)
pO2, Arterial: 90.2 mmHg (ref 83.0–108.0)

## 2017-12-15 LAB — ABO/RH: ABO/RH(D): A POS

## 2017-12-15 LAB — TYPE AND SCREEN
ABO/RH(D): A POS
Antibody Screen: NEGATIVE

## 2017-12-15 LAB — PROTIME-INR
INR: 1.02
Prothrombin Time: 13.3 seconds (ref 11.4–15.2)

## 2017-12-15 LAB — CBC
HEMATOCRIT: 41.5 % (ref 39.0–52.0)
HEMOGLOBIN: 13.7 g/dL (ref 13.0–17.0)
MCH: 30.7 pg (ref 26.0–34.0)
MCHC: 33 g/dL (ref 30.0–36.0)
MCV: 93 fL (ref 78.0–100.0)
PLATELETS: 228 10*3/uL (ref 150–400)
RBC: 4.46 MIL/uL (ref 4.22–5.81)
RDW: 12.4 % (ref 11.5–15.5)
WBC: 5.4 10*3/uL (ref 4.0–10.5)

## 2017-12-15 LAB — SURGICAL PCR SCREEN
MRSA, PCR: NEGATIVE
Staphylococcus aureus: POSITIVE — AB

## 2017-12-15 LAB — APTT: APTT: 28 s (ref 24–36)

## 2017-12-15 LAB — HEMOGLOBIN A1C
HEMOGLOBIN A1C: 5.5 % (ref 4.8–5.6)
MEAN PLASMA GLUCOSE: 111.15 mg/dL

## 2017-12-15 MED ORDER — ALBUTEROL SULFATE (2.5 MG/3ML) 0.083% IN NEBU
2.5000 mg | INHALATION_SOLUTION | Freq: Once | RESPIRATORY_TRACT | Status: AC
  Administered 2017-12-15: 2.5 mg via RESPIRATORY_TRACT

## 2017-12-15 MED ORDER — DIAZEPAM 5 MG PO TABS: 5.0000 mg | ORAL_TABLET | Freq: Four times a day (QID) | ORAL | 0 refills | Status: DC | PRN

## 2017-12-15 NOTE — Progress Notes (Signed)
[address]       [place] [address]             [phone]     CARDIOTHORACIC SURGERY OFFICE NOTE  Referring Provider is Josue Hector, MD PCP is Colon Branch, MD   HPI:  Patient returns the office today with tentative plans to proceed with aortic valve replacement later this week.  He was originally seen in consultation on November 11, 2017.  He reports no new problems or complaints.   Current Outpatient Medications  Medication Sig Dispense Refill  . aspirin 81 MG tablet Take 81 mg by mouth daily.      Marland Kitchen levothyroxine (SYNTHROID, LEVOTHROID) 88 MCG tablet TAKE 1 TABLET BY MOUTH EVERY MORNING BEFORE BREAKFAST 30 tablet 6  . losartan-hydrochlorothiazide (HYZAAR) 100-12.5 MG tablet Take 1 tablet by mouth daily. 30 tablet 5  . metoprolol tartrate (LOPRESSOR) 50 MG tablet TAKE 1 TABLET (50 MG TOTAL) BY MOUTH 2 (TWO) TIMES DAILY. 60 tablet 6  . NIFEdipine (PROCARDIA-XL/ADALAT-CC/NIFEDICAL-XL) 30 MG 24 hr tablet TAKE 1 TABLET BY MOUTH DAILY 30 tablet 6  . rosuvastatin (CRESTOR) 20 MG tablet TAKE 1 TABLET BY MOUTH DAILY 30 tablet 6  . tobramycin (TOBREX) 0.3 % ophthalmic solution PUT 1 DROP IN RIGHT EYE 4X DAILY, BEGIN 1 DAY PRIOR TO TREATMENT,DAY OF, & DAY AFTER  5   No current facility-administered medications for this visit.       Physical Exam:   BP 112/71   Pulse 71   Resp 20   Ht 5\' 8"  (1.727 m)   Wt 189 lb (85.7 kg)   SpO2 96% Comment: RA  BMI 28.74 kg/m   General:  Well-appearing  Chest:   Clear to auscultation  CV:   Regular rate and rhythm with prominent systolic murmur  Incisions:  n/a  Abdomen:  Soft nontender  Extremities:  Warm and well-perfused  Diagnostic Tests:  n/a   Impression:  Patient has stage D severe symptomatic aortic stenosis.  He presents with progressive symptoms of exertional shortness of breath and fatigue consistent with chronic diastolic congestive heart failure, New York Heart Association functional class IIb.   He has also been having some intermittent dizzy spells without syncope.  I have personally reviewed the patient's recent transthoracic echocardiogram, diagnostic cardiac catheterization, and CT angiograms.  Echocardiogram reveals normal left ventricular systolic function with severe aortic stenosis.  The aortic valve appears trileaflet with severe thickening, calcification, and restricted leaflet mobility involving all 3 leaflets.  Peak velocity across aortic valve measured greater than 4.1 m/s corresponding to mean transvalvular gradient estimated 39 mmHg and aortic valve area calculated less than 0.7 cm.  Diagnostic cardiac catheterization revealed normal coronary artery anatomy with no significant coronary artery disease.  Right-sided pressures were normal.  There is no question the patient needs aortic valve replacement.  Risks associated with conventional surgery should be relatively low.  CT angiography reveals normal-appearing ascending thoracic aorta with normal size aortic root and no contraindications to peripheral arterial cannulation for surgery.   Plan:  The patient and his family were again counseled at length regarding treatment alternatives for management of severe aortic stenosis including continued medical therapy versus proceeding with aortic valve replacement in the near future.  The natural history of aortic stenosis was reviewed, as was long term prognosis with medical therapy alone.  Surgical options were discussed at length including conventional surgical aortic valve replacement through either a full median sternotomy or  using minimally invasive techniques.  Other alternatives including rapid-deployment bioprosthetic tissue valve replacement, transcatheter aortic valve replacement, patch enlargement of the aortic root, and stentless porcine aortic root replacement were discussed.  Current indications for use of transcatheter aortic valve replacement were explained at length.   Discussion was held comparing the relative risks of mechanical valve replacement with need for lifelong anticoagulation versus use of a bioprosthetic tissue valve and the associated potential for late structural valve deterioration and failure.  This discussion was placed in the context of the patient's particular circumstances, and as a result the patient specifically requests that their valve be replaced using a bioprosthetic tissue valve .  The potential advantages and disadvantages associated with use of a rapid-deployment bioprosthetic aortic valve were discussed, including the risks of paravalvular leak, need for permanent pacemaker placement, and expectations for long-term durability.  The patient understands and accepts all potential associated risks of surgery including but not limited to risk of death, stroke, myocardial infarction, congestive heart failure, respiratory failure, renal failure, pneumonia, bleeding requiring blood transfusion and or reexploration, arrhythmia, heart block or bradycardia requiring permanent pacemaker, aortic dissection or other major vascular complication, pleural effusions or other delayed complications related to continued congestive heart failure, and other late complications related to valve replacement including structural valve deterioration and failure, thrombosis, endocarditis, or paravalvular leak.  Expectations for the day of surgery and his postoperative convalescence have been reviewed at length.  All the questions have been answered.  I spent in excess of 15 minutes during the conduct of this office consultation and >50% of this time involved direct face-to-face encounter with the patient for counseling and/or coordination of their care.    Valentina Gu. Roxy Manns, MD 12/15/2017 1:03 PM

## 2017-12-15 NOTE — Progress Notes (Signed)
Mupirocin Ointment [facility] for positive PCR of Staph. Pt notified of results and need to pick up Rx.

## 2017-12-15 NOTE — Patient Instructions (Signed)
Stop taking aspirin  Continue taking all other medications without change through the day before surgery.  Have nothing to eat or drink after midnight the night before surgery.  On the morning of surgery take only levothyroxine and metoprolol with a sip of water.  You may use your eye drops.

## 2017-12-15 NOTE — Progress Notes (Signed)
Pre-op Cardiac Surgery  Carotid Findings:  Bilateral 1-39% ICA stenosis, antegrade vertebral flow.   Upper Extremity Right Left  Brachial Pressures 140, Tri 134, Tri  Radial Waveforms Tri Tri  Ulnar Waveforms Tri Tri  Palmar Arch (Allen's Test) waveform reverses with radial compression and is nearly unchanged with ulnar compression.   waveform reverses with radial compression and is nearly unchanged with ulnar compression.     [place], RVT 11:00 AM  12/15/2017

## 2017-12-16 ENCOUNTER — Other Ambulatory Visit: Payer: Self-pay | Admitting: Internal Medicine

## 2017-12-16 MED ORDER — MAGNESIUM SULFATE 50 % IJ SOLN
40.0000 meq | INTRAMUSCULAR | Status: DC
  Filled 2017-12-16: qty 9.85

## 2017-12-16 MED ORDER — SODIUM CHLORIDE 0.9 % IV SOLN
INTRAVENOUS | Status: AC
  Administered 2017-12-17: 1.2 [IU]/h via INTRAVENOUS
  Filled 2017-12-16: qty 1

## 2017-12-16 MED ORDER — KENNESTONE BLOOD CARDIOPLEGIA (KBC) MANNITOL SYRINGE (20%, 32ML)
32.0000 mL | Freq: Once | INTRAVENOUS | Status: DC
  Filled 2017-12-16: qty 32

## 2017-12-16 MED ORDER — KENNESTONE BLOOD CARDIOPLEGIA VIAL
13.0000 mL | Freq: Once | Status: DC
  Filled 2017-12-16: qty 13

## 2017-12-16 MED ORDER — PLASMA-LYTE 148 IV SOLN
INTRAVENOUS | Status: DC
  Filled 2017-12-16: qty 2.5

## 2017-12-16 MED ORDER — TRANEXAMIC ACID 1000 MG/10ML IV SOLN
1.5000 mg/kg/h | INTRAVENOUS | Status: AC
  Administered 2017-12-17: 1.5 mg/kg/h via INTRAVENOUS
  Filled 2017-12-16: qty 25

## 2017-12-16 MED ORDER — VANCOMYCIN HCL 1000 MG IV SOLR
INTRAVENOUS | Status: DC
  Filled 2017-12-16: qty 1000

## 2017-12-16 MED ORDER — SODIUM CHLORIDE 0.9 % IV SOLN
30.0000 ug/min | INTRAVENOUS | Status: AC
  Administered 2017-12-17: 20 ug/min via INTRAVENOUS
  Filled 2017-12-16: qty 2

## 2017-12-16 MED ORDER — DOPAMINE-DEXTROSE 3.2-5 MG/ML-% IV SOLN
0.0000 ug/kg/min | INTRAVENOUS | Status: DC
  Filled 2017-12-16: qty 250

## 2017-12-16 MED ORDER — DEXMEDETOMIDINE HCL IN NACL 400 MCG/100ML IV SOLN
0.1000 ug/kg/h | INTRAVENOUS | Status: AC
  Administered 2017-12-17: .4 ug/kg/h via INTRAVENOUS
  Filled 2017-12-16: qty 100

## 2017-12-16 MED ORDER — MILRINONE LACTATE IN DEXTROSE 20-5 MG/100ML-% IV SOLN
0.3750 ug/kg/min | INTRAVENOUS | Status: DC
  Filled 2017-12-16: qty 100

## 2017-12-16 MED ORDER — NITROGLYCERIN IN D5W 200-5 MCG/ML-% IV SOLN
2.0000 ug/min | INTRAVENOUS | Status: DC
  Filled 2017-12-16: qty 250

## 2017-12-16 MED ORDER — POTASSIUM CHLORIDE 2 MEQ/ML IV SOLN
80.0000 meq | INTRAVENOUS | Status: DC
  Filled 2017-12-16: qty 40

## 2017-12-16 MED ORDER — VANCOMYCIN HCL 10 G IV SOLR
1250.0000 mg | INTRAVENOUS | Status: AC
  Administered 2017-12-17: 1250 mg via INTRAVENOUS
  Filled 2017-12-16: qty 1250

## 2017-12-16 MED ORDER — CEFUROXIME SODIUM 1.5 G IV SOLR
1.5000 g | INTRAVENOUS | Status: AC
  Administered 2017-12-17: 1.5 g via INTRAVENOUS
  Filled 2017-12-16: qty 1.5

## 2017-12-16 MED ORDER — HEPARIN SODIUM (PORCINE) 1000 UNIT/ML IJ SOLN
INTRAMUSCULAR | Status: DC
  Filled 2017-12-16: qty 30

## 2017-12-16 MED ORDER — TRANEXAMIC ACID (OHS) BOLUS VIA INFUSION
15.0000 mg/kg | INTRAVENOUS | Status: AC
  Administered 2017-12-17: 1285.5 mg via INTRAVENOUS
  Filled 2017-12-16: qty 1286

## 2017-12-16 MED ORDER — SODIUM CHLORIDE 0.9 % IV SOLN
750.0000 mg | INTRAVENOUS | Status: AC
  Administered 2017-12-17: 750 mg via INTRAVENOUS
  Filled 2017-12-16: qty 750

## 2017-12-16 MED ORDER — TRANEXAMIC ACID (OHS) PUMP PRIME SOLUTION
2.0000 mg/kg | INTRAVENOUS | Status: DC
  Filled 2017-12-16: qty 1.71

## 2017-12-16 MED ORDER — EPINEPHRINE PF 1 MG/ML IJ SOLN
0.0000 ug/min | INTRAVENOUS | Status: DC
  Filled 2017-12-16: qty 4

## 2017-12-16 NOTE — Anesthesia Preprocedure Evaluation (Addendum)
Anesthesia Evaluation  Patient identified by MRN, date of birth, ID band Patient awake    Reviewed: Allergy & Precautions, NPO status , Patient's Chart, lab work & pertinent test results  History of Anesthesia Complications Negative for: history of anesthetic complications  Airway Mallampati: I  TM Distance: >3 FB Neck ROM: Full    Dental  (+) Edentulous Upper, Edentulous Lower   Pulmonary former smoker,    breath sounds clear to auscultation       Cardiovascular hypertension, Pt. on medications and Pt. on home beta blockers (-) angina(-) CAD + Valvular Problems/Murmurs (severe AS) AS  Rhythm:Regular Rate:Normal + Systolic murmurs 6/06 ECHO: EF 55-60%, severe AS with peak grad 68 mmHg, mean grad 43 mmHg, mild AI   Neuro/Psych Anxiety    GI/Hepatic negative GI ROS, Neg liver ROS,   Endo/Other  Hypothyroidism   Renal/GU negative Renal ROS     Musculoskeletal   Abdominal   Peds  Hematology negative hematology ROS (+)   Anesthesia Other Findings   Reproductive/Obstetrics                            Anesthesia Physical Anesthesia Plan  ASA: III  Anesthesia Plan: General   Post-op Pain Management:    Induction: Intravenous  PONV Risk Score and Plan: 2 and Treatment may vary due to age or medical condition  Airway Management Planned: Double Lumen EBT  Additional Equipment: Arterial line, PA Cath, TEE and Ultrasound Guidance Line Placement  Intra-op Plan:   Post-operative Plan: Post-operative intubation/ventilation  Informed Consent: I have reviewed the patients History and Physical, chart, labs and discussed the procedure including the risks, benefits and alternatives for the proposed anesthesia with the patient or authorized representative who has indicated his/her understanding and acceptance.   Dental advisory given  Plan Discussed with: CRNA and Surgeon  Anesthesia Plan  Comments: (Plan routine monitors, A line, PA cath , GETA with TEE and post op ventilation)        Anesthesia Quick Evaluation

## 2017-12-17 ENCOUNTER — Inpatient Hospital Stay (HOSPITAL_COMMUNITY)
Admission: RE | Admit: 2017-12-17 | Discharge: 2017-12-23 | DRG: 220 | Disposition: A | Discharge status: Home / Self Care | Payer: Medicare Other | Source: Ambulatory Visit | Attending: Thoracic Surgery (Cardiothoracic Vascular Surgery) | Admitting: Thoracic Surgery (Cardiothoracic Vascular Surgery)

## 2017-12-17 ENCOUNTER — Encounter (HOSPITAL_COMMUNITY)
Admission: RE | Disposition: A | Discharge status: Home / Self Care | Payer: Self-pay | Source: Ambulatory Visit | Attending: Thoracic Surgery (Cardiothoracic Vascular Surgery)

## 2017-12-17 ENCOUNTER — Other Ambulatory Visit: Payer: Self-pay

## 2017-12-17 ENCOUNTER — Inpatient Hospital Stay (HOSPITAL_COMMUNITY): Payer: Medicare Other | Admitting: Anesthesiology

## 2017-12-17 ENCOUNTER — Encounter (HOSPITAL_COMMUNITY): Payer: Self-pay | Admitting: Anesthesiology

## 2017-12-17 ENCOUNTER — Inpatient Hospital Stay (HOSPITAL_COMMUNITY): Payer: Medicare Other

## 2017-12-17 DIAGNOSIS — D696 Thrombocytopenia, unspecified: Secondary | ICD-10-CM | POA: Diagnosis not present

## 2017-12-17 DIAGNOSIS — R109 Unspecified abdominal pain: Secondary | ICD-10-CM

## 2017-12-17 DIAGNOSIS — E785 Hyperlipidemia, unspecified: Secondary | ICD-10-CM | POA: Diagnosis present

## 2017-12-17 DIAGNOSIS — Z79899 Other long term (current) drug therapy: Secondary | ICD-10-CM

## 2017-12-17 DIAGNOSIS — Z888 Allergy status to other drugs, medicaments and biological substances status: Secondary | ICD-10-CM

## 2017-12-17 DIAGNOSIS — I10 Essential (primary) hypertension: Secondary | ICD-10-CM | POA: Diagnosis present

## 2017-12-17 DIAGNOSIS — Z87891 Personal history of nicotine dependence: Secondary | ICD-10-CM | POA: Diagnosis not present

## 2017-12-17 DIAGNOSIS — J9811 Atelectasis: Secondary | ICD-10-CM | POA: Diagnosis not present

## 2017-12-17 DIAGNOSIS — R7989 Other specified abnormal findings of blood chemistry: Secondary | ICD-10-CM | POA: Diagnosis not present

## 2017-12-17 DIAGNOSIS — D62 Acute posthemorrhagic anemia: Secondary | ICD-10-CM | POA: Diagnosis not present

## 2017-12-17 DIAGNOSIS — I350 Nonrheumatic aortic (valve) stenosis: Secondary | ICD-10-CM

## 2017-12-17 DIAGNOSIS — Q231 Congenital insufficiency of aortic valve: Secondary | ICD-10-CM

## 2017-12-17 DIAGNOSIS — E039 Hypothyroidism, unspecified: Secondary | ICD-10-CM | POA: Diagnosis present

## 2017-12-17 DIAGNOSIS — I110 Hypertensive heart disease with heart failure: Secondary | ICD-10-CM | POA: Diagnosis present

## 2017-12-17 DIAGNOSIS — Z7982 Long term (current) use of aspirin: Secondary | ICD-10-CM | POA: Diagnosis not present

## 2017-12-17 DIAGNOSIS — Z9889 Other specified postprocedural states: Secondary | ICD-10-CM

## 2017-12-17 DIAGNOSIS — Z953 Presence of xenogenic heart valve: Secondary | ICD-10-CM

## 2017-12-17 DIAGNOSIS — R112 Nausea with vomiting, unspecified: Secondary | ICD-10-CM | POA: Diagnosis not present

## 2017-12-17 DIAGNOSIS — I447 Left bundle-branch block, unspecified: Secondary | ICD-10-CM | POA: Diagnosis present

## 2017-12-17 DIAGNOSIS — I5032 Chronic diastolic (congestive) heart failure: Secondary | ICD-10-CM | POA: Diagnosis present

## 2017-12-17 HISTORY — DX: Anxiety disorder, unspecified: F41.9

## 2017-12-17 HISTORY — PX: AORTIC VALVE REPLACEMENT: SHX41

## 2017-12-17 HISTORY — DX: Dyspnea, unspecified: R06.00

## 2017-12-17 HISTORY — DX: Presence of xenogenic heart valve: Z95.3

## 2017-12-17 HISTORY — PX: TEE WITHOUT CARDIOVERSION: SHX5443

## 2017-12-17 HISTORY — DX: Cardiac murmur, unspecified: R01.1

## 2017-12-17 LAB — POCT I-STAT, CHEM 8
BUN: 18 mg/dL (ref 8–23)
BUN: 15 mg/dL (ref 8–23)
BUN: 16 mg/dL (ref 8–23)
BUN: 15 mg/dL (ref 8–23)
BUN: 16 mg/dL (ref 8–23)
BUN: 14 mg/dL (ref 8–23)
BUN: 14 mg/dL (ref 8–23)
CALCIUM ION: 1.25 mmol/L (ref 1.15–1.40)
CALCIUM ION: 1.18 mmol/L (ref 1.15–1.40)
CALCIUM ION: 1.01 mmol/L — AB (ref 1.15–1.40)
CALCIUM ION: 1.08 mmol/L — AB (ref 1.15–1.40)
CHLORIDE: 101 mmol/L (ref 98–111)
CREATININE: 0.8 mg/dL (ref 0.61–1.24)
CREATININE: 0.9 mg/dL (ref 0.61–1.24)
CREATININE: 0.9 mg/dL (ref 0.61–1.24)
CREATININE: 1 mg/dL (ref 0.61–1.24)
Calcium, Ion: 1.05 mmol/L — ABNORMAL LOW (ref 1.15–1.40)
Calcium, Ion: 1.07 mmol/L — ABNORMAL LOW (ref 1.15–1.40)
Calcium, Ion: 1.1 mmol/L — ABNORMAL LOW (ref 1.15–1.40)
Chloride: 101 mmol/L (ref 98–111)
Chloride: 103 mmol/L (ref 98–111)
Chloride: 100 mmol/L (ref 98–111)
Chloride: 99 mmol/L (ref 98–111)
Chloride: 101 mmol/L (ref 98–111)
Chloride: 106 mmol/L (ref 98–111)
Creatinine, Ser: 1.1 mg/dL (ref 0.61–1.24)
Creatinine, Ser: 1 mg/dL (ref 0.61–1.24)
Creatinine, Ser: 0.9 mg/dL (ref 0.61–1.24)
GLUCOSE: 118 mg/dL — AB (ref 70–99)
GLUCOSE: 130 mg/dL — AB (ref 70–99)
GLUCOSE: 123 mg/dL — AB (ref 70–99)
Glucose, Bld: 104 mg/dL — ABNORMAL HIGH (ref 70–99)
Glucose, Bld: 113 mg/dL — ABNORMAL HIGH (ref 70–99)
Glucose, Bld: 135 mg/dL — ABNORMAL HIGH (ref 70–99)
Glucose, Bld: 108 mg/dL — ABNORMAL HIGH (ref 70–99)
HCT: 29 % — ABNORMAL LOW (ref 39.0–52.0)
HCT: 22 % — ABNORMAL LOW (ref 39.0–52.0)
HCT: 21 % — ABNORMAL LOW (ref 39.0–52.0)
HEMATOCRIT: 26 % — AB (ref 39.0–52.0)
HEMATOCRIT: 29 % — AB (ref 39.0–52.0)
HEMATOCRIT: 22 % — AB (ref 39.0–52.0)
HEMATOCRIT: 25 % — AB (ref 39.0–52.0)
HEMOGLOBIN: 9.9 g/dL — AB (ref 13.0–17.0)
HEMOGLOBIN: 8.8 g/dL — AB (ref 13.0–17.0)
HEMOGLOBIN: 9.9 g/dL — AB (ref 13.0–17.0)
HEMOGLOBIN: 7.5 g/dL — AB (ref 13.0–17.0)
HEMOGLOBIN: 7.1 g/dL — AB (ref 13.0–17.0)
Hemoglobin: 7.5 g/dL — ABNORMAL LOW (ref 13.0–17.0)
Hemoglobin: 8.5 g/dL — ABNORMAL LOW (ref 13.0–17.0)
POTASSIUM: 3.6 mmol/L (ref 3.5–5.1)
POTASSIUM: 3.8 mmol/L (ref 3.5–5.1)
Potassium: 3.3 mmol/L — ABNORMAL LOW (ref 3.5–5.1)
Potassium: 3.5 mmol/L (ref 3.5–5.1)
Potassium: 4 mmol/L (ref 3.5–5.1)
Potassium: 4.1 mmol/L (ref 3.5–5.1)
Potassium: 3.4 mmol/L — ABNORMAL LOW (ref 3.5–5.1)
SODIUM: 143 mmol/L (ref 135–145)
SODIUM: 141 mmol/L (ref 135–145)
SODIUM: 141 mmol/L (ref 135–145)
SODIUM: 137 mmol/L (ref 135–145)
Sodium: 137 mmol/L (ref 135–145)
Sodium: 140 mmol/L (ref 135–145)
Sodium: 142 mmol/L (ref 135–145)
TCO2: 30 mmol/L (ref 22–32)
TCO2: 27 mmol/L (ref 22–32)
TCO2: 28 mmol/L (ref 22–32)
TCO2: 29 mmol/L (ref 22–32)
TCO2: 29 mmol/L (ref 22–32)
TCO2: 23 mmol/L (ref 22–32)
TCO2: 24 mmol/L (ref 22–32)

## 2017-12-17 LAB — GLUCOSE, CAPILLARY
GLUCOSE-CAPILLARY: 125 mg/dL — AB (ref 70–99)
GLUCOSE-CAPILLARY: 109 mg/dL — AB (ref 70–99)
GLUCOSE-CAPILLARY: 115 mg/dL — AB (ref 70–99)
Glucose-Capillary: 122 mg/dL — ABNORMAL HIGH (ref 70–99)
Glucose-Capillary: 105 mg/dL — ABNORMAL HIGH (ref 70–99)
Glucose-Capillary: 109 mg/dL — ABNORMAL HIGH (ref 70–99)

## 2017-12-17 LAB — POCT I-STAT 3, ART BLOOD GAS (G3+)
Acid-Base Excess: 2 mmol/L (ref 0.0–2.0)
Bicarbonate: 26.4 mmol/L (ref 20.0–28.0)
O2 Saturation: 100 %
PH ART: 7.457 — AB (ref 7.350–7.450)
PO2 ART: 406 mmHg — AB (ref 83.0–108.0)
TCO2: 28 mmol/L (ref 22–32)
pCO2 arterial: 37.4 mmHg (ref 32.0–48.0)

## 2017-12-17 LAB — CBC
HEMATOCRIT: 29.7 % — AB (ref 39.0–52.0)
HEMATOCRIT: 27.7 % — AB (ref 39.0–52.0)
HEMOGLOBIN: 9.8 g/dL — AB (ref 13.0–17.0)
Hemoglobin: 9 g/dL — ABNORMAL LOW (ref 13.0–17.0)
MCH: 30.8 pg (ref 26.0–34.0)
MCH: 30.4 pg (ref 26.0–34.0)
MCHC: 33 g/dL (ref 30.0–36.0)
MCHC: 32.5 g/dL (ref 30.0–36.0)
MCV: 93.4 fL (ref 78.0–100.0)
MCV: 93.6 fL (ref 78.0–100.0)
Platelets: 155 10*3/uL (ref 150–400)
Platelets: 132 10*3/uL — ABNORMAL LOW (ref 150–400)
RBC: 3.18 MIL/uL — AB (ref 4.22–5.81)
RBC: 2.96 MIL/uL — ABNORMAL LOW (ref 4.22–5.81)
RDW: 12.5 % (ref 11.5–15.5)
RDW: 12.7 % (ref 11.5–15.5)
WBC: 10.3 10*3/uL (ref 4.0–10.5)
WBC: 7.5 10*3/uL (ref 4.0–10.5)

## 2017-12-17 LAB — MAGNESIUM: MAGNESIUM: 3 mg/dL — AB (ref 1.7–2.4)

## 2017-12-17 LAB — HEMOGLOBIN AND HEMATOCRIT, BLOOD
HEMATOCRIT: 24 % — AB (ref 39.0–52.0)
Hemoglobin: 8.2 g/dL — ABNORMAL LOW (ref 13.0–17.0)

## 2017-12-17 LAB — CREATININE, SERUM
CREATININE: 1.11 mg/dL (ref 0.61–1.24)
GFR calc Af Amer: >60 mL/min (ref >60)

## 2017-12-17 LAB — POCT I-STAT 4, (NA,K, GLUC, HGB,HCT)
GLUCOSE: 117 mg/dL — AB (ref 70–99)
HCT: 28 % — ABNORMAL LOW (ref 39.0–52.0)
Hemoglobin: 9.5 g/dL — ABNORMAL LOW (ref 13.0–17.0)
POTASSIUM: 3.6 mmol/L (ref 3.5–5.1)
SODIUM: 143 mmol/L (ref 135–145)

## 2017-12-17 LAB — PLATELET COUNT: PLATELETS: 138 10*3/uL — AB (ref 150–400)

## 2017-12-17 LAB — PROTIME-INR
INR: 1.53
Prothrombin Time: 18.3 seconds — ABNORMAL HIGH (ref 11.4–15.2)

## 2017-12-17 LAB — APTT: aPTT: 39 seconds — ABNORMAL HIGH (ref 24–36)

## 2017-12-17 SURGERY — REPLACEMENT, AORTIC VALVE, MINIMALLY INVASIVE
Anesthesia: General | Site: Chest

## 2017-12-17 MED ORDER — ONDANSETRON HCL 4 MG/2ML IJ SOLN
INTRAMUSCULAR | Status: AC
  Filled 2017-12-17: qty 2

## 2017-12-17 MED ORDER — FENTANYL CITRATE (PF) 250 MCG/5ML IJ SOLN
INTRAMUSCULAR | Status: DC | PRN
  Administered 2017-12-17: 50 ug via INTRAVENOUS
  Administered 2017-12-17: 500 ug via INTRAVENOUS
  Administered 2017-12-17: 50 ug via INTRAVENOUS
  Administered 2017-12-17: 150 ug via INTRAVENOUS
  Administered 2017-12-17: 100 ug via INTRAVENOUS
  Administered 2017-12-17: 50 ug via INTRAVENOUS

## 2017-12-17 MED ORDER — POTASSIUM CHLORIDE 10 MEQ/50ML IV SOLN
10.0000 meq | INTRAVENOUS | Status: AC
  Administered 2017-12-17 (×3): 10 meq via INTRAVENOUS

## 2017-12-17 MED ORDER — ALBUMIN HUMAN 5 % IV SOLN
INTRAVENOUS | Status: DC | PRN
  Administered 2017-12-17 (×2): via INTRAVENOUS

## 2017-12-17 MED ORDER — PROTAMINE SULFATE 10 MG/ML IV SOLN
INTRAVENOUS | Status: DC | PRN
  Administered 2017-12-17 (×2): 40 mg via INTRAVENOUS
  Administered 2017-12-17: 30 mg via INTRAVENOUS
  Administered 2017-12-17: 40 mg via INTRAVENOUS
  Administered 2017-12-17: 50 mg via INTRAVENOUS
  Administered 2017-12-17: 20 mg via INTRAVENOUS
  Administered 2017-12-17 (×2): 40 mg via INTRAVENOUS

## 2017-12-17 MED ORDER — PHENYLEPHRINE 40 MCG/ML (10ML) SYRINGE FOR IV PUSH (FOR BLOOD PRESSURE SUPPORT)
PREFILLED_SYRINGE | INTRAVENOUS | Status: DC | PRN
  Administered 2017-12-17 (×7): 80 ug via INTRAVENOUS

## 2017-12-17 MED ORDER — ACETAMINOPHEN 650 MG RE SUPP
650.0000 mg | Freq: Once | RECTAL | Status: AC
  Administered 2017-12-17: 650 mg via RECTAL

## 2017-12-17 MED ORDER — LACTATED RINGERS IV SOLN
INTRAVENOUS | Status: DC | PRN
  Administered 2017-12-17: 08:00:00 via INTRAVENOUS

## 2017-12-17 MED ORDER — MIDAZOLAM HCL 2 MG/2ML IJ SOLN: 2.0000 mg | INTRAMUSCULAR | Status: DC | PRN

## 2017-12-17 MED ORDER — FENTANYL CITRATE (PF) 250 MCG/5ML IJ SOLN
INTRAMUSCULAR | Status: AC
  Filled 2017-12-17: qty 5

## 2017-12-17 MED ORDER — PHENYLEPHRINE 40 MCG/ML (10ML) SYRINGE FOR IV PUSH (FOR BLOOD PRESSURE SUPPORT)
PREFILLED_SYRINGE | INTRAVENOUS | Status: AC
  Filled 2017-12-17: qty 10

## 2017-12-17 MED ORDER — PROPOFOL 10 MG/ML IV BOLUS
INTRAVENOUS | Status: AC
  Filled 2017-12-17: qty 20

## 2017-12-17 MED ORDER — ONDANSETRON HCL 4 MG/2ML IJ SOLN
INTRAMUSCULAR | Status: DC | PRN
  Administered 2017-12-17: 4 mg via INTRAVENOUS

## 2017-12-17 MED ORDER — MAGNESIUM SULFATE 4 GM/100ML IV SOLN
4.0000 g | Freq: Once | INTRAVENOUS | Status: AC
  Administered 2017-12-17: 4 g via INTRAVENOUS
  Filled 2017-12-17: qty 100

## 2017-12-17 MED ORDER — SODIUM CHLORIDE 0.9 % IV SOLN
INTRAVENOUS | Status: DC
  Administered 2017-12-17: 15:00:00 via INTRAVENOUS

## 2017-12-17 MED ORDER — LACTATED RINGERS IV SOLN: 500.0000 mL | Freq: Once | INTRAVENOUS | Status: DC | PRN

## 2017-12-17 MED ORDER — ACETAMINOPHEN 160 MG/5ML PO SOLN: 1000.0000 mg | Freq: Four times a day (QID) | ORAL | Status: DC

## 2017-12-17 MED ORDER — MORPHINE SULFATE (PF) 2 MG/ML IV SOLN: 1.0000 mg | INTRAVENOUS | Status: DC | PRN

## 2017-12-17 MED ORDER — SODIUM CHLORIDE 0.9% FLUSH
3.0000 mL | Freq: Two times a day (BID) | INTRAVENOUS | Status: DC
  Administered 2017-12-18: 10 mL via INTRAVENOUS
  Administered 2017-12-18 – 2017-12-22 (×4): 3 mL via INTRAVENOUS

## 2017-12-17 MED ORDER — SODIUM CHLORIDE 0.9% FLUSH: 3.0000 mL | INTRAVENOUS | Status: DC | PRN

## 2017-12-17 MED ORDER — DEXMEDETOMIDINE HCL IN NACL 200 MCG/50ML IV SOLN
0.0000 ug/kg/h | INTRAVENOUS | Status: DC
  Filled 2017-12-17: qty 50

## 2017-12-17 MED ORDER — LACTATED RINGERS IV SOLN
INTRAVENOUS | Status: DC | PRN
  Administered 2017-12-17 (×2): via INTRAVENOUS

## 2017-12-17 MED ORDER — SODIUM CHLORIDE 0.9 % IV SOLN
INTRAVENOUS | Status: DC
  Filled 2017-12-17: qty 1

## 2017-12-17 MED ORDER — CHLORHEXIDINE GLUCONATE 0.12 % MT SOLN
15.0000 mL | Freq: Two times a day (BID) | OROMUCOSAL | Status: DC
  Administered 2017-12-18 – 2017-12-19 (×3): 15 mL via OROMUCOSAL
  Filled 2017-12-17 (×2): qty 15

## 2017-12-17 MED ORDER — ARTIFICIAL TEARS OPHTHALMIC OINT
TOPICAL_OINTMENT | OPHTHALMIC | Status: DC | PRN
  Administered 2017-12-17: 1 via OPHTHALMIC

## 2017-12-17 MED ORDER — LEVOTHYROXINE SODIUM 88 MCG PO TABS
88.0000 ug | ORAL_TABLET | Freq: Every day | ORAL | Status: DC
  Administered 2017-12-18 – 2017-12-23 (×6): 88 ug via ORAL
  Filled 2017-12-17 (×6): qty 1

## 2017-12-17 MED ORDER — DEXAMETHASONE SODIUM PHOSPHATE 10 MG/ML IJ SOLN
INTRAMUSCULAR | Status: AC
  Filled 2017-12-17: qty 1

## 2017-12-17 MED ORDER — PANTOPRAZOLE SODIUM 40 MG PO TBEC
40.0000 mg | DELAYED_RELEASE_TABLET | Freq: Every day | ORAL | Status: DC
  Administered 2017-12-19 – 2017-12-23 (×5): 40 mg via ORAL
  Filled 2017-12-17 (×5): qty 1

## 2017-12-17 MED ORDER — ALBUMIN HUMAN 5 % IV SOLN
250.0000 mL | INTRAVENOUS | Status: AC | PRN
  Administered 2017-12-17 – 2017-12-18 (×3): 250 mL via INTRAVENOUS
  Filled 2017-12-17: qty 250

## 2017-12-17 MED ORDER — HEMOSTATIC AGENTS (NO CHARGE) OPTIME
TOPICAL | Status: DC | PRN
  Administered 2017-12-17 (×2): 1 via TOPICAL

## 2017-12-17 MED ORDER — BISACODYL 10 MG RE SUPP: 10.0000 mg | Freq: Every day | RECTAL | Status: DC

## 2017-12-17 MED ORDER — DEXAMETHASONE SODIUM PHOSPHATE 10 MG/ML IJ SOLN
INTRAMUSCULAR | Status: DC | PRN
  Administered 2017-12-17: 4 mg via INTRAVENOUS

## 2017-12-17 MED ORDER — LACTATED RINGERS IV SOLN: INTRAVENOUS | Status: DC

## 2017-12-17 MED ORDER — METOPROLOL TARTRATE 12.5 MG HALF TABLET: 12.5000 mg | ORAL_TABLET | Freq: Once | ORAL | Status: DC

## 2017-12-17 MED ORDER — MORPHINE SULFATE (PF) 2 MG/ML IV SOLN
1.0000 mg | INTRAVENOUS | Status: DC | PRN
  Administered 2017-12-17 – 2017-12-18 (×3): 2 mg via INTRAVENOUS
  Filled 2017-12-17 (×3): qty 1

## 2017-12-17 MED ORDER — SUGAMMADEX SODIUM 500 MG/5ML IV SOLN
INTRAVENOUS | Status: DC | PRN
  Administered 2017-12-17: 200 mg via INTRAVENOUS

## 2017-12-17 MED ORDER — DEXTROSE 5 % IV SOLN
INTRAVENOUS | Status: DC | PRN
  Administered 2017-12-17: 25 ug/min via INTRAVENOUS

## 2017-12-17 MED ORDER — ORAL CARE MOUTH RINSE: 15.0000 mL | Freq: Two times a day (BID) | OROMUCOSAL | Status: DC

## 2017-12-17 MED ORDER — CHLORHEXIDINE GLUCONATE CLOTH 2 % EX PADS
6.0000 | MEDICATED_PAD | Freq: Every day | CUTANEOUS | Status: AC
  Administered 2017-12-18 – 2017-12-22 (×4): 6 via TOPICAL

## 2017-12-17 MED ORDER — NITROGLYCERIN IN D5W 200-5 MCG/ML-% IV SOLN: 0.0000 ug/min | INTRAVENOUS | Status: DC

## 2017-12-17 MED ORDER — HEPARIN SODIUM (PORCINE) 1000 UNIT/ML IJ SOLN
INTRAMUSCULAR | Status: DC | PRN
  Administered 2017-12-17: 30000 [IU] via INTRAVENOUS

## 2017-12-17 MED ORDER — CHLORHEXIDINE GLUCONATE 0.12 % MT SOLN
15.0000 mL | Freq: Once | OROMUCOSAL | Status: AC
  Administered 2017-12-17: 15 mL via OROMUCOSAL
  Filled 2017-12-17: qty 15

## 2017-12-17 MED ORDER — DOCUSATE SODIUM 100 MG PO CAPS
200.0000 mg | ORAL_CAPSULE | Freq: Every day | ORAL | Status: DC
  Administered 2017-12-18 – 2017-12-23 (×6): 200 mg via ORAL
  Filled 2017-12-17 (×6): qty 2

## 2017-12-17 MED ORDER — ROCURONIUM BROMIDE 10 MG/ML (PF) SYRINGE
PREFILLED_SYRINGE | INTRAVENOUS | Status: AC
  Filled 2017-12-17: qty 10

## 2017-12-17 MED ORDER — CHLORHEXIDINE GLUCONATE 0.12 % MT SOLN
15.0000 mL | OROMUCOSAL | Status: AC
  Administered 2017-12-17: 15 mL via OROMUCOSAL

## 2017-12-17 MED ORDER — PROTAMINE SULFATE 10 MG/ML IV SOLN
INTRAVENOUS | Status: AC
  Filled 2017-12-17: qty 5

## 2017-12-17 MED ORDER — GLYCOPYRROLATE PF 0.2 MG/ML IJ SOSY
PREFILLED_SYRINGE | INTRAMUSCULAR | Status: AC
  Filled 2017-12-17: qty 1

## 2017-12-17 MED ORDER — CHLORHEXIDINE GLUCONATE 4 % EX LIQD: 30.0000 mL | CUTANEOUS | Status: DC

## 2017-12-17 MED ORDER — FENTANYL CITRATE (PF) 250 MCG/5ML IJ SOLN
INTRAMUSCULAR | Status: AC
  Filled 2017-12-17: qty 20

## 2017-12-17 MED ORDER — ASPIRIN 81 MG PO CHEW: 324.0000 mg | CHEWABLE_TABLET | Freq: Every day | ORAL | Status: DC

## 2017-12-17 MED ORDER — FAMOTIDINE IN NACL 20-0.9 MG/50ML-% IV SOLN
20.0000 mg | Freq: Two times a day (BID) | INTRAVENOUS | Status: DC
  Administered 2017-12-17: 20 mg via INTRAVENOUS

## 2017-12-17 MED ORDER — METOPROLOL TARTRATE 5 MG/5ML IV SOLN
2.5000 mg | INTRAVENOUS | Status: DC | PRN
  Administered 2017-12-20 – 2017-12-21 (×3): 5 mg via INTRAVENOUS
  Filled 2017-12-17 (×3): qty 5

## 2017-12-17 MED ORDER — SODIUM CHLORIDE 0.9 % IV SOLN
250.0000 mL | INTRAVENOUS | Status: DC
  Administered 2017-12-19: 250 mL via INTRAVENOUS

## 2017-12-17 MED ORDER — TRAMADOL HCL 50 MG PO TABS
50.0000 mg | ORAL_TABLET | ORAL | Status: DC | PRN
  Administered 2017-12-18 – 2017-12-20 (×2): 100 mg via ORAL
  Filled 2017-12-17 (×2): qty 2

## 2017-12-17 MED ORDER — VANCOMYCIN HCL 1000 MG IV SOLR
INTRAVENOUS | Status: DC | PRN
  Administered 2017-12-17: 1000 mL

## 2017-12-17 MED ORDER — INSULIN REGULAR BOLUS VIA INFUSION
0.0000 [IU] | Freq: Three times a day (TID) | INTRAVENOUS | Status: DC
  Filled 2017-12-17: qty 10

## 2017-12-17 MED ORDER — MIDAZOLAM HCL 5 MG/5ML IJ SOLN
INTRAMUSCULAR | Status: DC | PRN
  Administered 2017-12-17: 2 mg via INTRAVENOUS
  Administered 2017-12-17: 1 mg via INTRAVENOUS
  Administered 2017-12-17: 2 mg via INTRAVENOUS
  Administered 2017-12-17: 3 mg via INTRAVENOUS

## 2017-12-17 MED ORDER — ROCURONIUM BROMIDE 10 MG/ML (PF) SYRINGE
PREFILLED_SYRINGE | INTRAVENOUS | Status: DC | PRN
  Administered 2017-12-17 (×3): 50 mg via INTRAVENOUS

## 2017-12-17 MED ORDER — SODIUM CHLORIDE 0.9 % IV SOLN
INTRAVENOUS | Status: DC | PRN
  Administered 2017-12-17: 13:00:00 via INTRAVENOUS

## 2017-12-17 MED ORDER — PHENYLEPHRINE HCL-NACL 20-0.9 MG/250ML-% IV SOLN
0.0000 ug/min | INTRAVENOUS | Status: DC
  Filled 2017-12-17: qty 250

## 2017-12-17 MED ORDER — ACETAMINOPHEN 500 MG PO TABS
1000.0000 mg | ORAL_TABLET | Freq: Four times a day (QID) | ORAL | Status: AC
  Administered 2017-12-17 – 2017-12-22 (×13): 1000 mg via ORAL
  Filled 2017-12-17 (×13): qty 2

## 2017-12-17 MED ORDER — SUGAMMADEX SODIUM 500 MG/5ML IV SOLN
INTRAVENOUS | Status: AC
  Filled 2017-12-17: qty 5

## 2017-12-17 MED ORDER — HEPARIN SODIUM (PORCINE) 1000 UNIT/ML IJ SOLN
INTRAMUSCULAR | Status: AC
  Filled 2017-12-17: qty 1

## 2017-12-17 MED ORDER — 0.9 % SODIUM CHLORIDE (POUR BTL) OPTIME
TOPICAL | Status: DC | PRN
  Administered 2017-12-17: 5000 mL

## 2017-12-17 MED ORDER — MIDAZOLAM HCL 10 MG/2ML IJ SOLN
INTRAMUSCULAR | Status: AC
  Filled 2017-12-17: qty 2

## 2017-12-17 MED ORDER — VANCOMYCIN HCL IN DEXTROSE 1-5 GM/200ML-% IV SOLN
1000.0000 mg | Freq: Once | INTRAVENOUS | Status: AC
  Administered 2017-12-17: 1000 mg via INTRAVENOUS
  Filled 2017-12-17: qty 200

## 2017-12-17 MED ORDER — BISACODYL 5 MG PO TBEC
10.0000 mg | DELAYED_RELEASE_TABLET | Freq: Every day | ORAL | Status: DC
Start: 2017-12-18 — End: 2017-12-23
  Administered 2017-12-18 – 2017-12-23 (×6): 10 mg via ORAL
  Filled 2017-12-17 (×7): qty 2

## 2017-12-17 MED ORDER — ACETAMINOPHEN 160 MG/5ML PO SOLN: 650.0000 mg | Freq: Once | ORAL | Status: AC

## 2017-12-17 MED ORDER — ONDANSETRON HCL 4 MG/2ML IJ SOLN
4.0000 mg | Freq: Four times a day (QID) | INTRAMUSCULAR | Status: DC | PRN
  Administered 2017-12-18 – 2017-12-20 (×5): 4 mg via INTRAVENOUS
  Filled 2017-12-17 (×5): qty 2

## 2017-12-17 MED ORDER — PROPOFOL 10 MG/ML IV BOLUS
INTRAVENOUS | Status: DC | PRN
  Administered 2017-12-17: 30 mg via INTRAVENOUS

## 2017-12-17 MED ORDER — PROTAMINE SULFATE 10 MG/ML IV SOLN
INTRAVENOUS | Status: AC
  Filled 2017-12-17: qty 25

## 2017-12-17 MED ORDER — ASPIRIN EC 325 MG PO TBEC
325.0000 mg | DELAYED_RELEASE_TABLET | Freq: Every day | ORAL | Status: DC
  Administered 2017-12-18 – 2017-12-23 (×6): 325 mg via ORAL
  Filled 2017-12-17 (×6): qty 1

## 2017-12-17 MED ORDER — OXYCODONE HCL 5 MG PO TABS
5.0000 mg | ORAL_TABLET | ORAL | Status: DC | PRN
  Administered 2017-12-17 – 2017-12-20 (×6): 10 mg via ORAL
  Filled 2017-12-17 (×6): qty 2

## 2017-12-17 MED ORDER — GLYCOPYRROLATE PF 0.2 MG/ML IJ SOSY
PREFILLED_SYRINGE | INTRAMUSCULAR | Status: DC | PRN
  Administered 2017-12-17: .2 mg via INTRAVENOUS

## 2017-12-17 MED ORDER — SODIUM CHLORIDE 0.45 % IV SOLN
INTRAVENOUS | Status: DC | PRN
  Administered 2017-12-17: 15:00:00 via INTRAVENOUS
  Administered 2017-12-17: 20 mL/h via INTRAVENOUS

## 2017-12-17 MED ORDER — SODIUM CHLORIDE 0.9 % IV SOLN
1.5000 g | Freq: Two times a day (BID) | INTRAVENOUS | Status: AC
  Administered 2017-12-17 – 2017-12-19 (×4): 1.5 g via INTRAVENOUS
  Filled 2017-12-17 (×4): qty 1.5

## 2017-12-17 SURGICAL SUPPLY — 93 items
ADAPTER CARDIO PERF ANTE/RETRO (ADAPTER) ×3 IMPLANT
ADH SKN CLS APL DERMABOND .7 (GAUZE/BANDAGES/DRESSINGS) ×2
BAG DECANTER FOR FLEXI CONT (MISCELLANEOUS) ×3 IMPLANT
BATTERY MAXDRIVER (MISCELLANEOUS) ×3 IMPLANT
BLADE SURG ROTATE 9660 (MISCELLANEOUS) ×3 IMPLANT
CANISTER SUCT 3000ML PPV (MISCELLANEOUS) ×3 IMPLANT
CANNULA FEM VENOUS REMOTE 22FR (CANNULA) ×3 IMPLANT
CANNULA GUNDRY RCSP 15FR (MISCELLANEOUS) ×3 IMPLANT
CANNULA OPTISITE PERFUSION 16F (CANNULA) ×3 IMPLANT
CANNULA OPTISITE PERFUSION 18F (CANNULA) IMPLANT
CANNULA SUMP PERICARDIAL (CANNULA) ×6 IMPLANT
CATH ENDOVENT PULMONARY (CATHETERS) IMPLANT
CATH HEART VENT LEFT (CATHETERS) ×2 IMPLANT
CELLS DAT CNTRL 66122 CELL SVR (MISCELLANEOUS) ×2 IMPLANT
CONN ST 1/4X3/8  BEN (MISCELLANEOUS) ×2
CONN ST 1/4X3/8 BEN (MISCELLANEOUS) ×4 IMPLANT
CONNECTOR 1/2X3/8X1/2 3 WAY (MISCELLANEOUS) ×1
CONNECTOR 1/2X3/8X1/2 3WAY (MISCELLANEOUS) ×2 IMPLANT
CONT SPEC 4OZ CLIKSEAL STRL BL (MISCELLANEOUS) ×6 IMPLANT
COVER BACK TABLE 24X17X13 BIG (DRAPES) ×3 IMPLANT
CRADLE DONUT ADULT HEAD (MISCELLANEOUS) ×3 IMPLANT
DERMABOND ADVANCED (GAUZE/BANDAGES/DRESSINGS) ×2
DERMABOND ADVANCED .7 DNX12 (GAUZE/BANDAGES/DRESSINGS) ×4 IMPLANT
DEVICE PMI PUNCTURE CLOSURE (MISCELLANEOUS) ×3 IMPLANT
DEVICE TROCAR PUNCTURE CLOSURE (ENDOMECHANICALS) ×3 IMPLANT
DRAIN CHANNEL 32F RND 10.7 FF (WOUND CARE) ×6 IMPLANT
DRAPE BILATERAL SPLIT (DRAPES) ×3 IMPLANT
DRAPE CV SPLIT W-CLR ANES SCRN (DRAPES) ×3 IMPLANT
DRAPE INCISE IOBAN 66X45 STRL (DRAPES) ×9 IMPLANT
DRAPE SLUSH/WARMER DISC (DRAPES) ×3 IMPLANT
DRSG AQUACEL AG ADV 3.5X10 (GAUZE/BANDAGES/DRESSINGS) ×3 IMPLANT
ELECT BLADE 4.0 EZ CLEAN MEGAD (MISCELLANEOUS) ×3
ELECT BLADE 6.5 EXT (BLADE) ×3 IMPLANT
ELECT REM PT RETURN 9FT ADLT (ELECTROSURGICAL) ×6
ELECTRODE BLDE 4.0 EZ CLN MEGD (MISCELLANEOUS) ×2 IMPLANT
ELECTRODE REM PT RTRN 9FT ADLT (ELECTROSURGICAL) ×4 IMPLANT
FELT TEFLON 1X6 (MISCELLANEOUS) ×6 IMPLANT
FEMORAL VENOUS CANN RAP (CANNULA) IMPLANT
GAUZE SPONGE 4X4 12PLY STRL (GAUZE/BANDAGES/DRESSINGS) ×3 IMPLANT
GLOVE BIO SURGEON STRL SZ 6.5 (GLOVE) ×18 IMPLANT
GLOVE ORTHO TXT STRL SZ7.5 (GLOVE) ×9 IMPLANT
GLOVE SURG SS PI 6.0 STRL IVOR (GLOVE) ×3 IMPLANT
GOWN STRL REUS W/ TWL LRG LVL3 (GOWN DISPOSABLE) ×8 IMPLANT
GOWN STRL REUS W/TWL LRG LVL3 (GOWN DISPOSABLE) ×12
IV NS 1000ML (IV SOLUTION) ×3
IV NS 1000ML BAXH (IV SOLUTION) ×2 IMPLANT
IV NS IRRIG 3000ML ARTHROMATIC (IV SOLUTION) ×3 IMPLANT
KIT BASIN OR (CUSTOM PROCEDURE TRAY) ×6 IMPLANT
KIT CATH SUCT 8FR (CATHETERS) ×3 IMPLANT
KIT DILATOR VASC 18G NDL (KITS) ×3 IMPLANT
KIT DRAINAGE VACCUM ASSIST (KITS) ×3 IMPLANT
KIT SUCTION CATH 14FR (SUCTIONS) ×3 IMPLANT
KIT SUT CK MINI COMBO 4X17 (Prosthesis & Implant Heart) ×3 IMPLANT
KIT TURNOVER KIT B (KITS) ×3 IMPLANT
LEAD PACING MYOCARDI (MISCELLANEOUS) ×3 IMPLANT
LINE VENT (MISCELLANEOUS) ×3 IMPLANT
NEEDLE AORTIC ROOT 14G 7F (CATHETERS) ×3 IMPLANT
NS IRRIG 1000ML POUR BTL (IV SOLUTION) ×15 IMPLANT
PACK OPEN HEART (CUSTOM PROCEDURE TRAY) ×3 IMPLANT
PAD ARMBOARD 7.5X6 YLW CONV (MISCELLANEOUS) ×6 IMPLANT
PAD ELECT DEFIB RADIOL ZOLL (MISCELLANEOUS) ×3 IMPLANT
PLATE RIB X SHAPE 10HOLE (Plate) ×3 IMPLANT
RTRCTR WOUND ALEXIS 18CM MED (MISCELLANEOUS) ×3
SCREW STERNAL 2.3X17MM (Screw) ×3 IMPLANT
SCREW STERNAL LOCK 2.3MM (Screw) ×24 IMPLANT
SET CANNULATION TOURNIQUET (MISCELLANEOUS) ×3 IMPLANT
SET CARDIOPLEGIA MPS 5001102 (MISCELLANEOUS) ×3 IMPLANT
SET IRRIG TUBING LAPAROSCOPIC (IRRIGATION / IRRIGATOR) ×3 IMPLANT
SOLUTION ANTI FOG 6CC (MISCELLANEOUS) ×3 IMPLANT
SUT BONE WAX W31G (SUTURE) ×3 IMPLANT
SUT ETHIBOND X763 2 0 SH 1 (SUTURE) ×21 IMPLANT
SUT GORETEX CV 4 TH 22 36 (SUTURE) ×3 IMPLANT
SUT GORETEX CV4 TH-18 (SUTURE) ×6 IMPLANT
SUT PROLENE 3 0 SH1 36 (SUTURE) ×9 IMPLANT
SUT PROLENE 4 0 RB 1 (SUTURE) ×4
SUT PROLENE 4-0 RB1 .5 CRCL 36 (SUTURE) ×8 IMPLANT
SUT PROLENE 6 0 C 1 30 (SUTURE) ×3 IMPLANT
SUT SILK  1 MH (SUTURE) ×1
SUT SILK 1 MH (SUTURE) ×2 IMPLANT
SUT TEM PAC WIRE 2 0 SH (SUTURE) ×6 IMPLANT
SYSTEM SAHARA CHEST DRAIN ATS (WOUND CARE) ×3 IMPLANT
TAPE CLOTH SURG 4X10 WHT LF (GAUZE/BANDAGES/DRESSINGS) ×3 IMPLANT
TAPE PAPER 2X10 WHT MICROPORE (GAUZE/BANDAGES/DRESSINGS) ×3 IMPLANT
TOWEL GREEN STERILE (TOWEL DISPOSABLE) ×3 IMPLANT
TOWEL GREEN STERILE FF (TOWEL DISPOSABLE) ×3 IMPLANT
TRAY FOLEY SLVR 16FR TEMP STAT (SET/KITS/TRAYS/PACK) ×3 IMPLANT
TROCAR XCEL BLADELESS 5X75MML (TROCAR) IMPLANT
TROCAR XCEL NON-BLD 11X100MML (ENDOMECHANICALS) ×6 IMPLANT
TUBE SUCT INTRACARD DLP 20F (MISCELLANEOUS) ×3 IMPLANT
UNDERPAD 30X30 (UNDERPADS AND DIAPERS) ×3 IMPLANT
VALVE SYSTEM EDWS INTUITY 23A (Prosthesis & Implant Heart) ×3 IMPLANT
VENT LEFT HEART 12002 (CATHETERS) ×3
WATER STERILE IRR 1000ML POUR (IV SOLUTION) ×6 IMPLANT

## 2017-12-17 NOTE — Progress Notes (Signed)
  Echocardiogram Echocardiogram Transesophageal has been performed.  [name] 12/17/2017, 10:40 AM

## 2017-12-17 NOTE — Anesthesia Procedure Notes (Signed)
Arterial Line Insertion Start/End7/03/2018 7:45 AM, 12/17/2017 8:00 AM Performed by: CRNA  Patient location: Pre-op. Preanesthetic checklist: patient identified, IV checked, site marked, risks and benefits discussed, surgical consent, monitors and equipment checked, pre-op evaluation, timeout performed and anesthesia consent Lidocaine 1% used for infiltration and patient sedated Left, radial was placed Catheter size: 20 G Hand hygiene performed , maximum sterile barriers used  and Seldinger technique used Allen's test indicative of satisfactory collateral circulation Attempts: 2 Procedure performed without using ultrasound guided technique. Following insertion, dressing applied and Biopatch. Post procedure assessment: normal  Patient tolerated the procedure well with no immediate complications.

## 2017-12-17 NOTE — Anesthesia Procedure Notes (Signed)
Procedure Name: Intubation Date/Time: 12/17/2017 9:10 AM Performed by: Renato Shin, CRNA Pre-anesthesia Checklist: Patient identified, Emergency Drugs available, Suction available and Patient being monitored Patient Re-evaluated:Patient Re-evaluated prior to induction Oxygen Delivery Method: Circle system utilized Preoxygenation: Pre-oxygenation with 100% oxygen Induction Type: IV induction Ventilation: Oral airway inserted - appropriate to patient size and Two handed mask ventilation required Laryngoscope Size: Miller and 3 Grade View: Grade I Endobronchial tube: Double lumen EBT and EBT position confirmed by fiberoptic bronchoscope and 39 Fr Number of attempts: 1 Airway Equipment and Method: Rigid stylet and Fiberoptic brochoscope Placement Confirmation: ETT inserted through vocal cords under direct vision,  positive ETCO2,  CO2 detector and breath sounds checked- equal and bilateral Tube secured with: Tape Dental Injury: Teeth and Oropharynx as per pre-operative assessment

## 2017-12-17 NOTE — Brief Op Note (Signed)
12/17/2017  2:06 PM  PATIENT:  Eric [name]  72 y.o. male  PRE-OPERATIVE DIAGNOSIS:  AS  POST-OPERATIVE DIAGNOSIS:  AS  PROCEDURE:  Procedure(s) with comments: MINIMALLY INVASIVE AORTIC VALVE REPLACEMENT (AVR) [ using Edwards Intuity Valve Size 7mm (N/A) - MINI THORACOTOMY TRANSESOPHAGEAL ECHOCARDIOGRAM (TEE) (N/A)  SURGEON:  Surgeon(s) and Role:    Rexene Alberts, MD - Primary  PHYSICIAN ASSISTANT:  Nicholes Rough, PA-C   ANESTHESIA:   general  EBL:  650 mL    BLOOD ADMINISTERED:none  DRAINS: ROUTINE   LOCAL MEDICATIONS USED:  NONE  SPECIMEN:  Source of Specimen:  AORTIC VALVE LEAFLETS  DISPOSITION OF SPECIMEN:  PATHOLOGY  COUNTS:  YES  TOURNIQUET:  * No tourniquets in log *  DICTATION: .Dragon Dictation  PLAN OF CARE: Admit to inpatient   PATIENT DISPOSITION:  ICU - intubated and hemodynamically stable.   Delay start of Pharmacological VTE agent (>24hrs) due to surgical blood loss or risk of bleeding: yes

## 2017-12-17 NOTE — Anesthesia Postprocedure Evaluation (Signed)
Anesthesia Post Note  Patient: Eric [name]  Procedure(s) Performed: MINIMALLY INVASIVE AORTIC VALVE REPLACEMENT (AVR) [ using Edwards Intuity Valve Size 41mm (N/A Chest) TRANSESOPHAGEAL ECHOCARDIOGRAM (TEE) (N/A )     Patient location during evaluation: SICU Anesthesia Type: General Level of consciousness: awake and alert, oriented and patient cooperative Pain management: pain level controlled Vital Signs Assessment: post-procedure vital signs reviewed and stable Respiratory status: spontaneous breathing, nonlabored ventilation, respiratory function stable and patient connected to nasal cannula oxygen Cardiovascular status: stable (remains on low-dose phenylephrine infusion) Postop Assessment: no apparent nausea or vomiting Anesthetic complications: no    Last Vitals:  Vitals:   12/17/17 1529 12/17/17 1530  BP:  (!) 154/71  Pulse:  91  Resp:  (!) 27  Temp:  36.7 C  SpO2: 97% 93%    Last Pain:  Vitals:   12/17/17 0654  TempSrc:   PainSc: 0-No pain                 Bhavya Grand,E. Deaunna Olarte

## 2017-12-17 NOTE — OR Nursing (Signed)
13:30 - 45 minute call to SICU charge nurse

## 2017-12-17 NOTE — Transfer of Care (Signed)
Immediate Anesthesia Transfer of Care Note  Patient: Eric [name]  Procedure(s) Performed: MINIMALLY INVASIVE AORTIC VALVE REPLACEMENT (AVR) [ using Edwards Intuity Valve Size 66mm (N/A Chest) TRANSESOPHAGEAL ECHOCARDIOGRAM (TEE) (N/A )  Patient Location: SICU  Anesthesia Type:General  Level of Consciousness: awake, drowsy and patient cooperative  Airway & Oxygen Therapy: Patient Spontanous Breathing and Patient connected to face mask oxygen  Post-op Assessment: Report given to RN, Post -op Vital signs reviewed and stable and Patient moving all extremities X 4  Post vital signs: Reviewed and stable  Last Vitals:  Vitals Value Taken Time  BP    Temp    Pulse    Resp    SpO2      Last Pain:  Vitals:   12/17/17 0654  TempSrc:   PainSc: 0-No pain      Patients Stated Pain Goal: 3 (36/92/23 0097)  Complications: No apparent anesthesia complications

## 2017-12-17 NOTE — H&P (Signed)
[address]       [place] [address]             [phone]          CARDIOTHORACIC SURGERY HISTORY AND PHYSICAL EXAM  Referring Provider is [name], MD PCP is Colon Branch, MD      Chief Complaint  Patient presents with  . Aortic Stenosis    Surgical eval, Cardiac Cath 10/16/17, ECHO 09/08/17, Cardiac CT 11/10/17, CTA C/A/P 11/10/17    HPI:  Patient is a 72 year old male with history of aortic stenosis, hypertension, hyperlipidemia, and hypothyroidism who has been referred for surgical consultation to discuss treatment options for management of severe symptomatic aortic stenosis.  Patient states that he was first noted to have a heart murmur on routine physical exam more than 15 years ago.  He has been followed regularly by his primary care physician, Dr. Kathlene November.  Echocardiogram performed in 2011 reportedly revealed mild aortic stenosis with normal left ventricular function.  Over the past 6 months the patient has developed progressive symptoms of exertional shortness of breath and fatigue.  Follow-up transthoracic echocardiogram performed September 08, 2017 revealed severe aortic stenosis with preserved left ventricular systolic function.  Peak velocity across aortic valve was reported 4.1 m/s corresponding to mean transvalvular gradient estimated 39 mmHg.  The DVI was reported 0.22 with aortic valve area calculated 0.69 cm.  Patient was referred for cardiology consultation and evaluated recently by Dr. Bettina Gavia.  He was subsequently referred to the multidisciplinary heart valve clinic and underwent diagnostic cardiac catheterization by Dr. Burt Knack on Oct 16, 2017.  Catheterization revealed widely patent coronary arteries with no significant coronary artery disease.  There are normal right-sided pressures.  CT angiography was performed and the patient was referred for elective surgical consultation.  The patient is married and lives locally in [place] with his  wife.  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  He has remained entirely functionally independent and reasonably active physically although he admits that he does not exercise regularly.  He states that he has not had any significant physical problems until over the past 6 months when he has developed progressive symptoms of exertional shortness of breath and fatigue.  He now gets short of breath intermittently with relatively low level activity and occasionally if he tries to lay flat in bed.  He has had frequent dizzy spells without any syncopal episodes.  He denies any history of chest pain or chest tightness either with activity or at rest.  He has had some mild lower extremity edema.  Patient returns the office today with tentative plans to proceed with aortic valve replacement later this week.  He was originally seen in consultation on November 11, 2017.  He reports no new problems or complaints.     Past Medical History:  Diagnosis Date  . Abnormal LFTs   . Anxiety   . Aortic stenosis 04/15/2011  . Dizziness 04/21/2014  . Dyspnea   . Heart murmur   . Hyperglycemia 10/20/2014  . Hyperlipidemia   . Hypertension   . Hypothyroidism   . Macular edema    Dr  Sherlynn Stalls     Past Surgical History:  Procedure Laterality Date  . CARDIAC CATHETERIZATION  11/10/00  . CATARACT EXTRACTION W/ INTRAOCULAR LENS IMPLANT     RIGHT EYE  . CHOLECYSTECTOMY    .  INGUINAL HERNIA REPAIR     (B)  . POLYPECTOMY    . RIGHT/LEFT HEART CATH AND CORONARY ANGIOGRAPHY N/A 10/16/2017   Procedure: RIGHT/LEFT HEART CATH AND CORONARY ANGIOGRAPHY;  Surgeon: [name], MD;  Location: [facility];  Service: Cardiovascular;  Laterality: N/A;  . TONSILLECTOMY      Family History  Problem Relation Age of Onset  . Diabetes Mother   . Stroke Mother   . Diabetes Brother        ?  . Coronary artery disease Brother         Male 1st degree relative >50, had CABG in his 24s  . Emphysema Father   . COPD Father   . Colon cancer Neg Hx   . Prostate cancer Neg Hx   . Colon polyps Neg Hx   . Esophageal cancer Neg Hx   . Rectal cancer Neg Hx   . Stomach cancer Neg Hx     Social History Social History   Tobacco Use  . Smoking status: Former Smoker    Last attempt to quit: 04/15/1990    Years since quitting: 27.6  . Smokeless tobacco: Never Used  . Tobacco comment: used to smoke 2 ppd  Substance Use Topics  . Alcohol use: No    Comment: no ETOH since the 90s  . Drug use: No    Prior to Admission medications   Medication Sig Start Date End Date Taking? Authorizing Provider  aspirin 81 MG tablet Take 81 mg by mouth daily.     Yes [provider]  tobramycin (TOBREX) 0.3 % ophthalmic solution PUT 1 DROP IN RIGHT EYE 4X DAILY, BEGIN 1 DAY PRIOR TO TREATMENT,DAY OF, & DAY AFTER 09/10/17  Yes [provider]  diazepam (VALIUM) 5 MG tablet Take 1 tablet (5 mg total) by mouth every 6 (six) hours as needed for anxiety (on the night before surgery). 12/15/17   Rexene Alberts, MD  levothyroxine (SYNTHROID, LEVOTHROID) 88 MCG tablet Take 1 tablet (88 mcg total) by mouth daily before breakfast. 12/16/17   Colon Branch, MD  losartan-hydrochlorothiazide (HYZAAR) 100-12.5 MG tablet Take 1 tablet by mouth daily. 12/16/17   Colon Branch, MD  metoprolol tartrate (LOPRESSOR) 50 MG tablet Take 1 tablet (50 mg total) by mouth 2 (two) times daily. 12/16/17   Colon Branch, MD  NIFEdipine (PROCARDIA-XL/ADALAT-CC/NIFEDICAL-XL) 30 MG 24 hr tablet Take 1 tablet (30 mg total) by mouth daily. 12/16/17   Colon Branch, MD  rosuvastatin (CRESTOR) 20 MG tablet Take 1 tablet (20 mg total) by mouth daily. 12/16/17   Colon Branch, MD    Allergies  Allergen Reactions  . Atorvastatin Other (See Comments)    REACTION: myalgia  . Ezetimibe Other (See Comments)    REACTION: myalgia     Review of Systems:              General:                       normal appetite, decreased energy, no weight gain, no weight loss, no fever             Cardiac:                       no chest pain with exertion, no chest pain at rest, + SOB with exertion, no resting SOB, no PND, + orthopnea, no palpitations, no arrhythmia, no atrial fibrillation, + LE  edema, + dizzy spells, no syncope             Respiratory:                 + exertional shortness of breath, no home oxygen, no productive cough, + dry cough, no bronchitis, no wheezing, no hemoptysis, no asthma, no pain with inspiration or cough, no sleep apnea, no CPAP at night             GI:                               no difficulty swallowing, no reflux, no frequent heartburn, no hiatal hernia, no abdominal pain, no constipation, no diarrhea, no hematochezia, no hematemesis, no melena             GU:                              no dysuria,  + frequency, no urinary tract infection, no hematuria, no enlarged prostate, no kidney stones, no kidney disease             Vascular:                     no pain suggestive of claudication, + pain in feet, + leg cramps, no varicose veins, no DVT, no non-healing foot ulcer             Neuro:                         no stroke, no TIA's, no seizures, no headaches, no temporary blindness one eye,  no slurred speech, no peripheral neuropathy, no chronic pain, no instability of gait, no memory/cognitive dysfunction             Musculoskeletal:         no arthritis, no joint swelling, no myalgias, no difficulty walking, normal mobility              Skin:                            no rash, no itching, no skin infections, no pressure sores or ulcerations             Psych:                         no anxiety, no depression, no nervousness, no unusual recent stress             Eyes:                           + blurry vision, no floaters, + recent vision changes, + wears glasses or contacts             ENT:                            no hearing loss, no loose or painful  teeth, full set dentures             Hematologic:               + easy bruising, no abnormal bleeding, no clotting disorder, no frequent epistaxis  Endocrine:                   no diabetes, does not check CBG's at home                                                       Physical Exam:              BP 132/76   Pulse 70   Resp 20   Ht 5\' 8"  (1.727 m)   Wt 192 lb (87.1 kg)   SpO2 97% Comment: RA  BMI 29.19 kg/m              General:                        well-appearing             HEENT:                       Unremarkable              Neck:                           no JVD, no bruits, no adenopathy              Chest:                          clear to auscultation, symmetrical breath sounds, no wheezes, no rhonchi              CV:                              RRR, grade IV/VI crescendo/decrescendo murmur heard best at RSB,  no diastolic murmur             Abdomen:                    soft, non-tender, no masses              Extremities:                 warm, well-perfused, pulses diminished but palpable, no LE edema             Rectal/GU                   Deferred             Neuro:                         Grossly non-focal and symmetrical throughout             Skin:                            Clean and dry, no rashes, no breakdown   Diagnostic Tests:  Transthoracic Echocardiography  Patient: Shaw, Dobek MR #: [id number] Study Date: 09/08/2017 Gender: M Age: 27 Height: 172.7 cm Weight: 86.4 kg BSA: 2.06 m^2 Pt. Status: Room:  ATTENDING Dorris Carnes, M.D. [facility]  PERFORMING [facility], Outpatient  cc:  ------------------------------------------------------------------- LV EF: 55% - 60%  ------------------------------------------------------------------- Indications: I35.0 Aortic valve  stenosis.  ------------------------------------------------------------------- History: Risk factors: Hypertension. Dyslipidemia.  ------------------------------------------------------------------- Study Conclusions  - Left ventricle: The cavity size was normal. Wall thickness was normal. Systolic function was normal. The estimated ejection fraction was in the range of 55% to 60%. - Aortic valve: AV is thickened, calcified with restricted motion Peak and mean gradients through the valve are 68 and 43 mm Hg respectively consistent with severe AS. There was mild regurgitation. - Mitral valve: There was mild regurgitation.  Impressions:  - Compared to echo from 2017, mean gradient is increased (23 to 43 mm Hg) AS now severe.  ------------------------------------------------------------------- Study data: Comparison was made to the study of 06/27/2015. Study status: Routine. Procedure: The patient reported no pain pre or post test. Transthoracic echocardiography. Image quality was adequate. Study completion: There were no complications. Transthoracic echocardiography. M-mode, complete 2D, spectral Doppler, and color Doppler. Birthdate: Patient birthdate: 04-Jan-1946. Age: Patient is 72 yr old. Sex: Gender: male. BMI: 29 kg/m^2. Blood pressure: 134/77 Patient status: Outpatient. Study date: Study date: 09/08/2017. Study time: 01:11 PM. Location: [facility]  -------------------------------------------------------------------  ------------------------------------------------------------------- Left ventricle: The cavity size was normal. Wall thickness was normal. Systolic function was normal. The estimated ejection fraction was in the range of 55% to 60%.  ------------------------------------------------------------------- Aortic valve: AV is thickened, calcified with restricted motion Peak and mean gradients through the  valve are 68 and 43 mm Hg respectively consistent with severe AS. Doppler: There was mild regurgitation. VTI ratio of LVOT to aortic valve: 0.23. Valve area (VTI): 0.74 cm^2. Indexed valve area (VTI): 0.36 cm^2/m^2. Peak velocity ratio of LVOT to aortic valve: 0.22. Valve area (Vmax): 0.69 cm^2. Indexed valve area (Vmax): 0.34 cm^2/m^2. Mean velocity ratio of LVOT to aortic valve: 0.21. Valve area (Vmean): 0.67 cm^2. Indexed valve area (Vmean): 0.32 cm^2/m^2. Mean gradient (S): 39 mm Hg. Peak gradient (S): 68 mm Hg.  ------------------------------------------------------------------- Aorta: The aorta was normal, not dilated, and non-diseased.  ------------------------------------------------------------------- Mitral valve: Mildly thickened leaflets . Doppler: There was mild regurgitation. Valve area by pressure half-time: 3.33 cm^2. Indexed valve area by pressure half-time: 1.62 cm^2/m^2. Peak gradient (D): 3 mm Hg.  ------------------------------------------------------------------- Left atrium: The atrium was normal in size.  ------------------------------------------------------------------- Right ventricle: The cavity size was normal. Wall thickness was normal. Systolic function was normal.  ------------------------------------------------------------------- Pulmonic valve: Structurally normal valve. Cusp separation was normal. Doppler: Transvalvular velocity was within the normal range. There was no regurgitation.  ------------------------------------------------------------------- Tricuspid valve: Structurally normal valve. Leaflet separation was normal. Doppler: Transvalvular velocity was within the normal range. There was mild regurgitation.  ------------------------------------------------------------------- Right atrium: The atrium was normal in  size.  ------------------------------------------------------------------- Pericardium: There was no pericardial effusion.  ------------------------------------------------------------------- Post procedure conclusions Ascending Aorta:  - The aorta was normal, not dilated, and non-diseased.  ------------------------------------------------------------------- Measurements  Left ventricle Value Reference LV ID, ED, PLAX chordal 43 mm 43 - 52 LV ID, ES, PLAX chordal 29 mm 23 - 38 LV fx shortening, PLAX chordal 33 % >=29 LV PW thickness, ED 11 mm ---------- IVS/LV PW ratio, ED 0.91 <=1.3 Stroke volume, 2D 75 ml ---------- Stroke volume/bsa, 2D 36 ml/m^2 ---------- LV e&', lateral 9.57 cm/s ---------- LV E/e&', lateral 8.57 ---------- LV e&', medial 6.09 cm/s ---------- LV E/e&', medial 13.46 ---------- LV e&', average 7.83 cm/s ---------- LV E/e&', average 10.47 ----------  Ventricular septum Value Reference IVS thickness, ED 10 mm ----------  LVOT Value Reference LVOT ID, S 20 mm ---------- LVOT area 3.14 cm^2 ---------- LVOT peak velocity, S 90.2 cm/s ---------- LVOT mean velocity, S 61.9 cm/s ---------- LVOT VTI, S 23.9 cm ----------  Aortic valve  Value Reference Aortic valve peak velocity, S 412 cm/s ---------- Aortic valve mean velocity, S 291 cm/s ---------- Aortic valve VTI, S 102 cm ---------- Aortic mean gradient, S 39 mm Hg ---------- Aortic peak gradient, S 68 mm Hg ---------- VTI ratio, LVOT/AV 0.23 ---------- Aortic valve area, VTI 0.74 cm^2 ---------- Aortic valve area/bsa, VTI 0.36 cm^2/m^2 ---------- Velocity ratio, peak, LVOT/AV 0.22 ---------- Aortic valve area, peak velocity 0.69 cm^2 ---------- Aortic valve area/bsa, peak 0.34 cm^2/m^2 ---------- velocity Velocity ratio, mean, LVOT/AV 0.21 ---------- Aortic valve area, mean velocity 0.67 cm^2 ---------- Aortic valve area/bsa, mean 0.32 cm^2/m^2 ---------- velocity Aortic regurg peak velocity 270 cm/s ---------- Aortic regurg pressure half-time 609 ms ---------- Aortic regurg peak gradient 29 mm Hg ----------  Aorta Value Reference Aortic root ID, ED 27 mm ----------  Left atrium Value Reference LA ID, A-P, ES 35 mm ---------- LA ID/bsa, A-P 1.7 cm/m^2 <=2.2 LA volume, S 32 ml ---------- LA volume/bsa, S 15.6 ml/m^2 ---------- LA volume, ES, 1-p A4C 32.6 ml ---------- LA volume/bsa, ES, 1-p A4C 15.8 ml/m^2 ---------- LA volume, ES, 1-p A2C 31.6 ml ---------- LA  volume/bsa, ES, 1-p A2C 15.4 ml/m^2 ----------  Mitral valve Value Reference Mitral E-wave peak velocity 82 cm/s ---------- Mitral A-wave peak velocity 82 cm/s ---------- Mitral deceleration time 218 ms 150 - 230 Mitral peak gradient, D 3 mm Hg ---------- Mitral E/A ratio, peak 1 ---------- Mitral valve area, PHT, DP 3.33 cm^2 ---------- Mitral valve area/bsa, PHT, DP 1.62 cm^2/m^2 ----------  Pulmonary arteries Value Reference PA pressure, S, DP 29 mm Hg <=30  Tricuspid valve Value Reference Tricuspid regurg peak velocity 253 cm/s ---------- Tricuspid peak RV-RA gradient 26 mm Hg ----------  Right atrium Value Reference RA ID, S-I, ES, A4C (H) 51.3 mm 34 - 49 RA area, ES, A4C 11.6 cm^2 8.3 - 19.5 RA volume, ES, A/L 22.5 ml ---------- RA volume/bsa, ES, A/L 10.9 ml/m^2 ----------  Systemic veins Value Reference Estimated CVP 3 mm Hg ----------  Right ventricle Value Reference RV pressure, S, DP 29 mm Hg <=30 RV s&', lateral, S 8.38 cm/s ----------  Legend: (L) and (H) mark values outside specified reference range.  ------------------------------------------------------------------- Prepared and Electronically Authenticated by  Dorris Carnes, M.D. 2019-04-01T19:32:07   RIGHT/LEFT HEART CATH AND CORONARY ANGIOGRAPHY  Conclusion    1. Known severe aortic stenosis by noninvasive assessment 2. Widely patent coronary arteries with minor luminal irregularity (right dominant) 3. Normal right-sided intracardiac pressures  Plan: CTA studies, cardiac surgical evaluation for aortic valve replacement  Indications   Severe aortic stenosis [I35.0 (ICD-10-CM)]  Procedural Details/Technique   Technical Details INDICATION: Severe symptomatic aortic stenosis. 72 yo male with bicuspid aortic valve who has developed severe, Stage D1, aortic stenosis with exertional lightheadedness and dyspnea. Echo shows peak and mean transvalvular gradients of 68 and 43 mmHg demonstrating progression of aortic stenosis into the severe range associated with classic symptoms.   PROCEDURAL DETAILS: The right wrist was prepped, draped, and anesthetized with 1% lidocaine. Using the modified Seldinger technique, a 5/6 French Slender sheath was introduced into the right radial artery. 3 mg of verapamil was administered through the sheath, weight-based unfractionated heparin was administered intravenously. Standard Judkins catheters were used for selective coronary angiography. Attempts were not made to cross the aortic valve.  Catheter exchanges were performed over an exchange length guidewire. There were no immediate procedural complications. A TR band was used for radial hemostasis at the completion of the procedure. The patient was transferred to the post catheterization recovery area for further monitoring.   Estimated blood loss <50 mL.  During this procedure the patient was administered the following to achieve and maintain moderate conscious sedation: Versed 2 mg, Fentanyl 50 mcg, while the patient's heart rate, blood pressure, and oxygen saturation were continuously monitored. The period of conscious sedation was 23 minutes, of which I was present face-to-face 100% of this time.  Coronary Findings   Diagnostic  Dominance: Right  Left Main  Vessel  is angiographically normal.  Left Anterior Descending  The vessel exhibits minimal luminal irregularities. The LAD wraps the LV apex. The vessel has minor irregularity and is patent throughout.  Ramus Intermedius  The vessel exhibits minimal luminal irregularities.  Left Circumflex  The vessel exhibits minimal luminal irregularities.  Right Coronary Artery  Vessel is moderate in size. Vessel is angiographically normal. Dominant RCA, no significant stenosis  Intervention   No interventions have been documented.  Left Heart   Aortic Valve There is severe aortic valve stenosis. The aortic valve is calcified. There is restricted aortic valve motion. Known severe AS by echo. The valve is heavily calcified and restricted on plane fluoroscopy. The valve is not crossed during the procedure.  Coronary Diagrams   Diagnostic Diagram       Implants    No implant documentation for this case.  MERGE Images   Show images for CARDIAC CATHETERIZATION   Link to Procedure Log   Procedure Log    Hemo Data    Most Recent Value  Fick Cardiac Output 5.61 L/min  Fick Cardiac Output Index 2.76 (L/min)/BSA  RA A Wave 11 mmHg  RA V Wave 8 mmHg  RA Mean 7 mmHg  RV Systolic Pressure 32 mmHg  RV Diastolic Pressure 7 mmHg  RV EDP 11 mmHg  PA Systolic Pressure 34 mmHg  PA Diastolic Pressure 13 mmHg  PA Mean 20 mmHg  PW A Wave 22 mmHg  PW V Wave 20 mmHg  PW Mean 17 mmHg  AO Systolic Pressure 338 mmHg  AO Diastolic Pressure 63 mmHg  AO Mean 85 mmHg  QP/QS 1  TPVR Index 7.24 HRUI  TSVR Index 30.74 HRUI  PVR SVR Ratio 0.04  TPVR/TSVR Ratio 0.24    Cardiac TAVR CT  TECHNIQUE: The patient was scanned on a Siemens 192 scanner. A 120 kV retrospective scan was triggered in the descending thoracic aorta at 111 HU's. Gantry rotation speed was 270 msecs and collimation was .9 mm. No beta blockade or nitro were given. The 3D data set was reconstructed in 5% intervals of the R-R cycle.  Systolic and diastolic phases were analyzed on a dedicated work station using MPR, MIP and VRT modes. The patient received 80 cc of contrast.  FINDINGS: Aortic Valve: Tri leaflet and calcified with restricted leaflet motion  Aorta: Non aneurysmal with mild calcific atherosclerosis Arch not visualized on this study  Sino-tubular Junction: 25.5 mm  Ascending Thoracic Aorta: 28 mm  Sinus of Valsalva Measurements:  Non-coronary: 28.2 mm  Right -coronary: 27 mm  Left - coronary: 28.4 mm  Coronary Artery Height above Annulus:  Left Main: 14.4 mm above annulus  Right Coronary: 16.5 mm above annulus  Virtual Basal Annulus Measurements:  Maximum/Minimum Diameter: 27.1 mm x 22.2 mm  Perimeter: 77.7 mm  Area:  449 mm2  Coronary Arteries: Sufficient height above annulus for deployment  Optimum Fluoroscopic Angle for Delivery: LAO 10 degrees Caudal 2 degrees  IMPRESSION: 1. Aortic Stenosis with tri leaflet valve annulus of 449 mm2 suitable for a 26 mm Sapien 3 valve  2. Normal aortic root with no aneurysm and mild atherosclerosis 2.8 cm  3. Optimum angiographic angle for deployment LAO 10 Caudal 2 degrees  4. Coronary arteries sufficient height above annulus for deployment  5. No LAA thrombus  Jenkins Rouge   Electronically Signed By: Jenkins Rouge M.D. On: 11/10/2017 12:06       CTA ABDOMEN AND PELVIS WITHOUT AND WITH CONTRAST  TECHNIQUE: Multidetector CT imaging of the abdomen and pelvis was performed using the standard protocol during bolus administration of intravenous contrast. Multiplanar reconstructed images and MIPs were obtained and reviewed to evaluate the vascular anatomy.  CONTRAST: 100 cc ISOVUE-370 IOPAMIDOL (ISOVUE-370) INJECTION 76%, 131mL ISOVUE-370 IOPAMIDOL (ISOVUE-370) INJECTION 76%  COMPARISON: 09/30/2017 chest radiograph.  FINDINGS: CTA CHEST FINDINGS  Cardiovascular: Normal heart size.  No significant pericardial effusion/thickening. Three-vessel coronary atherosclerosis. Severe thickening and calcification of the aortic valve. Atherosclerotic nonaneurysmal thoracic aorta. Normal caliber pulmonary arteries. No central pulmonary emboli.  Mediastinum/Nodes: Subcentimeter hypodense right thyroid lobe nodule. Unremarkable esophagus. No pathologically enlarged axillary, mediastinal or hilar lymph nodes.  Lungs/Pleura: No pneumothorax. No pleural effusion. No acute consolidative airspace disease or lung masses. Two tiny solid pulmonary nodules, largest 3 mm in the left upper lobe (series 16/image 48).  Musculoskeletal: No aggressive appearing focal osseous lesions. Moderate thoracic spondylosis.  CTA ABDOMEN AND PELVIS FINDINGS  Hepatobiliary: Normal liver size. A few scattered subcentimeter hypervascular liver foci (for example series 15/image 65 at the liver dome) are too small to characterize. No additional liver lesions. Cholecystectomy. Bile ducts are within normal post cholecystectomy limits. CBD diameter 8 mm.  Pancreas: Normal, with no mass or duct dilation.  Spleen: Normal size. No mass.  Adrenals/Urinary Tract: Normal adrenals. Subcentimeter hypodense renal cortical lesion in the anterior upper left kidney is too small to characterize. Otherwise normal kidneys, with no hydronephrosis. Normal bladder.  Stomach/Bowel: Normal non-distended stomach. Normal caliber small bowel with no small bowel wall thickening. Normal visualized appendix. Scattered mild colonic diverticulosis, most prominent in the proximal sigmoid colon, with no large bowel wall thickening or pericolonic fat stranding.  Vascular/Lymphatic: Atherosclerotic abdominal aorta with ectatic 2.8 cm infrarenal abdominal aorta. Retroaortic left renal vein. No pathologically enlarged lymph nodes in the abdomen or pelvis.  Reproductive: Mildly enlarged prostate with nonspecific  internal prostatic calcifications.  Other: No pneumoperitoneum, ascites or focal fluid collection. Small fat containing right inguinal hernia.  Musculoskeletal: No aggressive appearing focal osseous lesions. Mild lumbar spondylosis.  VASCULAR MEASUREMENTS PERTINENT TO TAVR:  AORTA:  Minimal Aortic Diameter-13.8 x 13.7 mm  Severity of Aortic Calcification-moderate  RIGHT PELVIS:  Right Common Iliac Artery -  Minimal Diameter-7.7 x 7.7 mm  Tortuosity-mild  Calcification-mild  Right External Iliac Artery -  Minimal Diameter-7.4 x 7.3 mm  Tortuosity-moderate  Calcification-mild  Right Common Femoral Artery -  Minimal Diameter-8.2 x 8.0 mm  Tortuosity-mild  Calcification-mild  LEFT PELVIS:  Left Common Iliac Artery -  Minimal Diameter-8.2 x 6.8 mm  Tortuosity-moderate  Calcification-moderate  Left External Iliac Artery -  Minimal Diameter-7.5 x 7.5 mm  Tortuosity-mild  Calcification-mild  Left Common Femoral Artery -  Minimal Diameter-7.1 x 5.5 mm  Tortuosity-mild  Calcification-mild  Review of the MIP images confirms the above findings.  IMPRESSION: 1. Vascular findings and  measurements pertinent to potential TAVR procedure, as detailed above. 2. Severe thickening and calcification of the aortic valve, compatible with the reported clinical history of severe aortic stenosis. 3. Tiny scattered solid pulmonary nodules, largest 3 mm. No follow-up needed if patient is low-risk (and has no known or suspected primary neoplasm). Non-contrast chest CT can be considered in 12 months if patient is high-risk. This recommendation follows the consensus statement: Guidelines for Management of Incidental Pulmonary Nodules Detected on CT Images:From the Fleischner Society 2017; published online before print (10.1148/radiol.[id number]). 4. Three-vessel coronary atherosclerosis. 5. Aortic Atherosclerosis  (ICD10-I70.0). 6. 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 Recommend follow-up aortic ultrasound in 5 years. This recommendation follows ACR consensus guidelines: White Paper of the ACR Incidental Findings Committee II on Vascular Findings. J Am Coll Radiol 2013; 10:789-794. 7. Subcentimeter hypervascular liver foci, too small to characterize, probably benign transient foci of enhancement, for which no follow-up is required unless the patient has risk factors for liver malignancy, in which case a follow-up MRI abdomen without and with IV contrast may be performed in 3-6 months. This recommendation follows ACR consensus guidelines: Managing Incidental Findings on Abdominal CT: White Paper of the ACR Incidental Findings Committee. J Am Coll Radiol 2010;7:754-773. 8. Mild colonic diverticulosis. 9. Mildly enlarged prostate.   Electronically Signed By: Ilona Sorrel M.D. On: 11/10/2017 12:15    STS Risk Calculator  Procedure: Isolated AVR CALCULATE   Risk of Mortality:  0.727% Renal Failure:  0.769% Permanent Stroke:  0.673% Prolonged Ventilation:  3.577% DSW Infection:  0.108% Reoperation:  3.050% Morbidity or Mortality:  6.485% Short Length of Stay:  60.054% Long Length of Stay:  2.065%     Impression:  Patient has stage D severe symptomatic aortic stenosis. He presents with progressive symptoms of exertional shortness of breath and fatigue consistent with chronic diastolic congestive heart failure, [place] Heart Association functional class IIb.He has also been having some intermittent dizzy spells without syncope. I have personally reviewed the patient's recent transthoracic echocardiogram, diagnostic cardiac catheterization, and CT angiograms. Echocardiogram reveals normal left ventricular systolic function with severe aortic stenosis. The aortic valve appears trileaflet with severe thickening,  calcification, and restricted leaflet mobility involving all 3 leaflets. Peak velocity across aortic valve measured greater than 4.1 m/s corresponding to mean transvalvular gradient estimated 39 mmHg and aortic valve area calculated less than 0.7 cm. Diagnostic cardiac catheterization revealed normal coronary artery anatomy with no significant coronary artery disease. Right-sided pressures were normal. There is no question the patient needs aortic valve replacement. Risks associated with conventional surgery should be relatively low. CT angiography reveals normal-appearing ascending thoracic aorta with normal size aortic root and no contraindications to peripheral arterial cannulation for surgery.   Plan:  The patientand his family were againcounseled at length regarding treatment alternatives for management of severe aortic stenosis including continued medical therapy versus proceeding with aortic valve replacement in the near future. The natural history of aortic stenosis was reviewed, as was long term prognosis with medical therapy alone. Surgical options were discussed at length including conventional surgical aortic valve replacement through either a full median sternotomy or using minimally invasive techniques. Other alternatives including rapid-deployment bioprosthetic tissue valve replacement, transcatheter aortic valve replacement, patch enlargement of the aortic root, andstentless porcine aortic root replacement were discussed.Current indications for use of transcatheter aortic valve replacement were explained at length. Discussion was held comparing the relative risks of mechanical valve replacement with need for lifelong anticoagulation versus use of  a bioprosthetic tissue valve and the associated potential for late structural valve deterioration and failure. This discussion was placed in the context of the patient's particular circumstances, and as a result the patient  specifically requests that their valve be replaced using a bioprosthetic tissue valve. The potential advantages and disadvantages associated with use of a rapid-deployment bioprosthetic aortic valve were discussed, including the risks of paravalvular leak, need for permanent pacemaker placement, and expectations for long-term durability. The patient understands and accepts all potential associated risks of surgery including but not limited to risk of death, stroke, myocardial infarction, congestive heart failure, respiratory failure, renal failure, pneumonia, bleeding requiring blood transfusion and or reexploration, arrhythmia, heart block or bradycardia requiring permanent pacemaker, aortic dissection or other major vascular complication, pleural effusions or other delayed complications related to continued congestive heart failure, and other late complications related to valve replacement including structural valve deterioration and failure, thrombosis, endocarditis, or paravalvular leak.  Expectations for the day of surgery and his postoperative convalescence have been reviewed at length.  All the questions have been answered.    Valentina Gu. Roxy Manns, MD 12/15/2017 1:03 PM

## 2017-12-17 NOTE — Anesthesia Procedure Notes (Addendum)
Central Venous Catheter Insertion Performed by: Annye Asa, MD, anesthesiologist Start/End7/03/2018 7:49 AM, 12/17/2017 8:25 AM Patient location: Pre-op. Preanesthetic checklist: patient identified, IV checked, site marked, risks and benefits discussed, surgical consent, monitors and equipment checked, pre-op evaluation, timeout performed and anesthesia consent Position: Trendelenburg Lidocaine 1% used for infiltration and patient sedated Hand hygiene performed , maximum sterile barriers used  and Seldinger technique used Catheter size: 8.5 Fr PA cath was placed.Sheath introducer Swan type:thermodilution Procedure performed using ultrasound guided (LIJ [place] and stick, but unable to pass guidewire on multiple attempts) technique. Ultrasound Notes:anatomy identified, needle tip was noted to be adjacent to the nerve/plexus identified and image(s) printed for medical record Attempts: 3 Following insertion, line sutured, dressing applied and Biopatch. Post procedure assessment: blood return through all ports, free fluid flow and no air  Patient tolerated the procedure well with no immediate complications. Additional procedure comments: PA catheter:  Routine monitors. Timeout, sterile prep, drape, FBP R neck.  Trendelenburg position.  1% Lido local, finder and trocar LIJ 1st pass with US guidance, unable to pass guidewire on multiple attempts. Re-prep and drape L subclavian, 1% lidocaine local and trocar into subclavia easily, Cordis placed over J wire. PA catheter in easily.  Sterile dressing applied.  Patient tolerated well, VSS.  Jenita Seashore, MD.          Dema Severin cannulation

## 2017-12-17 NOTE — Interval H&P Note (Signed)
History and Physical Interval Note:  12/17/2017 7:24 AM  Eric [name]  has presented today for surgery, with the diagnosis of AS  The various methods of treatment have been discussed with the patient and family. After consideration of risks, benefits and other options for treatment, the patient has consented to  Procedure(s) with comments: MINIMALLY INVASIVE AORTIC VALVE REPLACEMENT (AVR) (N/A) - MINI THORACOTOMY TRANSESOPHAGEAL ECHOCARDIOGRAM (TEE) (N/A) as a surgical intervention .  The patient's history has been reviewed, patient examined, no change in status, stable for surgery.  I have reviewed the patient's chart and labs.  Questions were answered to the patient's satisfaction.     Rexene Alberts

## 2017-12-17 NOTE — Op Note (Addendum)
CARDIOTHORACIC SURGERY OPERATIVE NOTE  Date of Procedure:  12/17/2017  Preoperative Diagnosis: Severe Aortic Stenosis   Postoperative Diagnosis: Same   Procedure:    Minimally Invasive Aortic Valve Replacement Right Anterior Mini-thoracotomy Edwards Intuity Elite Rapid Deployment Stented Bovine Pericardial Tissue Valve (size 23 mm, catalog #[id number], serial #[id number]) Plating of right 2nd costal cartilage (KLS titanium plate)   Surgeon: Valentina Gu. Roxy Manns, MD  Assistant: Nicholes Rough, PA-C  Anesthesia: Midge Minium, MD  Operative Findings:  Severe aortic stenosis  Mild aortic insufficiency  Normal LV systolic function            BRIEF CLINICAL NOTE AND INDICATIONS FOR SURGERY  Patient is a 72 year old male with history of aortic stenosis, hypertension, hyperlipidemia, and hypothyroidism who has been referred for surgical consultation to discuss treatment options for management of severe symptomatic aortic stenosis.  Patient states that he was first noted to have a heart murmur on routine physical exam more than 15 years ago. He has been followed regularly by his primary care physician, Dr. Kathlene November.Echocardiogram performed in 2011 reportedly revealed mild aortic stenosis with normal left ventricular function. Over the past 6 months the patient has developed progressive symptoms of exertional shortness of breath and fatigue. Follow-up transthoracic echocardiogram performed September 08, 2017 revealed severe aortic stenosis with preserved left ventricular systolic function. Peak velocity across aortic valve was reported 4.1 m/s corresponding to mean transvalvular gradient estimated 39 mmHg. The DVI was reported 0.22 with aortic valve area calculated 0.69 cm. Patient was referred for cardiology consultation and evaluated recently by Dr. Bettina Gavia. He was subsequently referred to the multidisciplinary heart valve clinic and underwent diagnostic cardiac catheterization by Dr.  Burt Knack on Oct 16, 2017. Catheterization revealed widely patent coronary arteries with no significant coronary artery disease. There are normal right-sided pressures. CT angiography was performed and the patient was referred for elective surgical consultation.  The patient has been seen in consultation and counseled at length regarding the indications, risks and potential benefits of surgery.  All questions have been answered, and the patient provides full informed consent for the operation as described.    DETAILS OF THE OPERATIVE PROCEDURE  Preparation:  The patient is brought to the operating room on the above mentioned date and central monitoring was established by the anesthesia team including placement of Swan-Ganz catheter through the left internal jugular vein.  A radial arterial line is placed. The patient is placed in the supine position on the operating table.  Intravenous antibiotics are administered. General endotracheal anesthesia is induced uneventfully. The patient is initially intubated using a dual lumen endotracheal tube.  A Foley catheter is placed.  Baseline transesophageal echocardiogram was performed.  Findings were notable for severe aortic stenosis, mild aortic insufficiency and normal LV systolic function.  The patient is placed in the supine position with their neck gently extended and turned to the left.   The patient's right neck, chest, abdomen, both groins, and both lower extremities are prepared and draped in a sterile manner. A time out procedure is performed.   Surgical Approach:  A right miniature anteriorthoracotomy incision is performed. The incision is placed immediately over the 3rd intercostal space.  The muscle fibers of the pectoralis major muscle are split longitudinally and the right pleural space is entered through the 3rd intercostal space. The 2nd costal cartilage is divided at it's insertion onto the sternum.  The right internal mammary artery and  veins are ligated and divided.  A soft tissue retractor is placed.  Two 11 mm ports are placed through separate stab incisions inferiorly. The right pleural space is insufflated continuously with carbon dioxide gas through the posterior port during the remainder of the operation.     Extracorporeal Cardiopulmonary Bypass and Myocardial Protection:  A small incision is made in the right inguinal crease and the anterior surface of the right common femoral artery and right common femoral vein are identified.  The patient is placed in Trendelenburg position. The right internal jugular vein is cannulated with Seldinger technique and a guidewire advanced into the right atrium. The patient is heparinized systemically.  A 14 French pediatric femoral venous cannula is placed through the right internal jugular vein into the superior vena cava.  Pursestring sutures are placed on the anterior surface of the right common femoral vein and right common femoral artery. The right common femoral vein is cannulated with the Seldinger technique and a guidewire is advanced under transesophageal echocardiogram guidance through the right atrium. The femoral vein is cannulated with a long 22 French femoral venous cannula. The right common femoral artery is cannulated with Seldinger technique and a flexible guidewire is advanced until it can be appreciated intraluminally in the descending thoracic aorta on transesophageal echocardiogram. The femoral artery is cannulated with an 18 French femoral arterial cannula.  Adequate heparinization is verified.      The entire pre-bypass portion of the operation was notable for stable hemodynamics.  Cardiopulmonary bypass was begun.  Vacuum assist venous drainage is utilized. An incision is made in the pericardium over the ascending aorta and extended in both directions. Silk traction sutures are placed to retract the pericardium.  Venous drainage and exposure are notably excellent. A  retrograde cardioplegia cannula is placed through the right atrium into the coronary sinus using transesophageal echocardiogram guidance.  A left ventricular vent is placed through the right superior pulmonary vein.  An antegrade cardioplegia cannula is placed in the ascending aorta.    The patient is cooled to 28C systemic temperature.  The aortic cross clamp is applied and cardioplegia is delivered initially in an antegrade fashion through the aortic root using modified [place] cold blood cardioplegia ([place] blood cardioplegia protocol).   The initial cardioplegic arrest is rapid with early diastolic arrest.  Additional cardioplegia is administered through the coronary sinus catheter.  Myocardial protection was felt to be excellent.   Aortic Valve Replacement:  An oblique transverse aortotomy incision was performed.   The aortic valve was inspected and notable for a trileaflet aortic valve with severe aortic stenosis.  The aortic valve leaflets were excised sharply and the aortic annulus decalcified.  Decalcification was notably straightforward.  The aortic annulus was sized to accept a 23 mm prosthesis.  The aortic root and left ventricle were irrigated with copious cold saline solution.  Aortic valve replacement was performed using an [employer] rapid deployment pericardial tissue valve (size 23 mm, model #8300AB, serial # [id number]).  The valve was rinsed in saline for manufacture recommendations. A total of 4 individual guiding sutures are placed through the aortic annulus, each at the corresponding nadir of each sinus of Valsalva with 2 sutures straddling the nadir of the non-coronary sinus.  The valve was attached to the delivery device and the 4 guiding sutures placed through the valve sewing cuff. The valve is lowered into place. Care is taken to insure that the valve is completely seated in the annulus. Each of the guide sutures are secured using Cor knot clips.  The valve stent  is deployed by inflating the deployment balloon to 4.5 atm pressure and holding for 10 seconds. The balloon is deflated the valve holder sutures cut In the deployment system removed.   The valve is carefully inspected to make sure that it is seated appropriately. Endoscopic visualization through the valve is utilized to inspect the left ventricular outflow tract. Aortic root was filled with saline to make sure the valve is competent. Rewarming is begun.   Procedure Completion:  The aortotomy was closed using a 2-layer closure of running 4-0 Prolene suture.  One final dose of warm retrograde "reanimation dose" cardioplegia was administered retrograde through the coronary sinus catheter while all air was evacuated through the aortic root.  The aortic cross clamp was removed after a total cross clamp time of 73 minutes.  Epicardial pacing wires are fixed to the right ventricular outflow tract and to the right atrial appendage. The patient is rewarmed to 37C temperature.  The pericardial sac was drained using a 28 French Bard drain placed through the anterior port incision.   The patient is weaned and disconnected from cardiopulmonary bypass.  The patient's rhythm at separation from bypass was AV paced.  The patient was weaned from bypass without any inotropic support. Total cardiopulmonary bypass time for the operation was 115 minutes.  Followup transesophageal echocardiogram performed after separation from bypass revealed   The femoral arterial and venous cannulae were removed uneventfully. There was a palpable pulse in the distal right common femoral artery after removal of the cannula.  Protamine was administered to reverse the anticoagulation.   The right internal jugular venous cannula is removed and manual pressure held for 15 minutes.  Single lung ventilation was begun. The aortotomy closure was inspected for hemostasis. The right pleural space is irrigated with saline solution and inspected for  hemostasis. The right pleural space was drained using a 28 French Bard drain placed through the posterior port incision. The 2nd costal cartilage is replaced using a small universal [employer] plate.  The miniature thoracotomy incision was closed in multiple layers in routine fashion. The right groin incision was inspected for hemostasis and closed in multiple layers in routine fashion.   The post-bypass portion of the operation was notable for stable rhythm and hemodynamics.   No blood products were administered during the operation.   Disposition:  The patient tolerated the procedure well.  The patient was extubated in the operating room and transported to the surgical intensive care unit in stable condition. There were no intraoperative complications. All sponge instrument and needle counts are verified correct at completion of the operation.     Valentina Gu. Roxy Manns MD 12/17/2017 2:07 PM

## 2017-12-17 NOTE — Progress Notes (Signed)
Patient ID: Eric [name], male   DOB: 1946-04-07, 72 y.o.   MRN: [id number]  T[facility] Evening Rounds:   Hemodynamically stable  CI = 2.1  Extubated in the OR, awake and alert  Urine output good  CT output low  CBC    Component Value Date/Time   WBC 10.3 12/17/2017 1449   RBC 3.18 (L) 12/17/2017 1449   HGB 9.8 (L) 12/17/2017 1449   HGB 13.6 10/03/2017 0830   HCT 29.7 (L) 12/17/2017 1449   HCT 41.4 10/03/2017 0830   PLT 155 12/17/2017 1449   PLT 226 10/03/2017 0830   MCV 93.4 12/17/2017 1449   MCV 95 10/03/2017 0830   MCH 30.8 12/17/2017 1449   MCHC 33.0 12/17/2017 1449   RDW 12.5 12/17/2017 1449   RDW 13.8 10/03/2017 0830   LYMPHSABS 1.2 09/03/2017 1037   MONOABS 0.5 09/03/2017 1037   EOSABS 0.1 09/03/2017 1037   BASOSABS 0.0 09/03/2017 1037     BMET    Component Value Date/Time   NA 143 12/17/2017 1448   NA 141 11/07/2017 0809   K 3.6 12/17/2017 1448   CL 101 12/17/2017 1312   CO2 24 12/15/2017 1153   GLUCOSE 117 (H) 12/17/2017 1448   BUN 14 12/17/2017 1312   BUN 16 11/07/2017 0809   CREATININE 0.90 12/17/2017 1312   CALCIUM 9.9 12/15/2017 1153   GFRNONAA 58 (L) 12/15/2017 1153   GFRAA >60 12/15/2017 1153     A/P:  Stable postop course. Continue current plans

## 2017-12-18 ENCOUNTER — Encounter (HOSPITAL_COMMUNITY): Payer: Self-pay | Admitting: Thoracic Surgery (Cardiothoracic Vascular Surgery)

## 2017-12-18 ENCOUNTER — Inpatient Hospital Stay (HOSPITAL_COMMUNITY): Payer: Medicare Other

## 2017-12-18 DIAGNOSIS — I447 Left bundle-branch block, unspecified: Secondary | ICD-10-CM

## 2017-12-18 HISTORY — DX: Left bundle-branch block, unspecified: I44.7

## 2017-12-18 LAB — POCT I-STAT 3, ART BLOOD GAS (G3+)
ACID-BASE DEFICIT: 2 mmol/L (ref 0.0–2.0)
Bicarbonate: 24.6 mmol/L (ref 20.0–28.0)
O2 SAT: 96 %
PCO2 ART: 50 mmHg — AB (ref 32.0–48.0)
PH ART: 7.298 — AB (ref 7.350–7.450)
TCO2: 26 mmol/L (ref 22–32)
pO2, Arterial: 94 mmHg (ref 83.0–108.0)

## 2017-12-18 LAB — GLUCOSE, CAPILLARY
GLUCOSE-CAPILLARY: 132 mg/dL — AB (ref 70–99)
Glucose-Capillary: 98 mg/dL (ref 70–99)
Glucose-Capillary: 120 mg/dL — ABNORMAL HIGH (ref 70–99)
Glucose-Capillary: 141 mg/dL — ABNORMAL HIGH (ref 70–99)
Glucose-Capillary: 153 mg/dL — ABNORMAL HIGH (ref 70–99)
Glucose-Capillary: 130 mg/dL — ABNORMAL HIGH (ref 70–99)

## 2017-12-18 LAB — CBC
HCT: 25.1 % — ABNORMAL LOW (ref 39.0–52.0)
HCT: 26.6 % — ABNORMAL LOW (ref 39.0–52.0)
HEMOGLOBIN: 8.2 g/dL — AB (ref 13.0–17.0)
Hemoglobin: 8.4 g/dL — ABNORMAL LOW (ref 13.0–17.0)
MCH: 30.7 pg (ref 26.0–34.0)
MCH: 30.7 pg (ref 26.0–34.0)
MCHC: 32.7 g/dL (ref 30.0–36.0)
MCHC: 31.6 g/dL (ref 30.0–36.0)
MCV: 94 fL (ref 78.0–100.0)
MCV: 97.1 fL (ref 78.0–100.0)
PLATELETS: 142 10*3/uL — AB (ref 150–400)
Platelets: 109 10*3/uL — ABNORMAL LOW (ref 150–400)
RBC: 2.67 MIL/uL — AB (ref 4.22–5.81)
RBC: 2.74 MIL/uL — ABNORMAL LOW (ref 4.22–5.81)
RDW: 13 % (ref 11.5–15.5)
RDW: 13.4 % (ref 11.5–15.5)
WBC: 5.6 10*3/uL (ref 4.0–10.5)
WBC: 11.2 10*3/uL — AB (ref 4.0–10.5)

## 2017-12-18 LAB — BASIC METABOLIC PANEL
Anion gap: 9 (ref 5–15)
BUN: 14 mg/dL (ref 8–23)
CHLORIDE: 107 mmol/L (ref 98–111)
CO2: 23 mmol/L (ref 22–32)
Calcium: 7.2 mg/dL — ABNORMAL LOW (ref 8.9–10.3)
Creatinine, Ser: 1.18 mg/dL (ref 0.61–1.24)
GFR calc non Af Amer: >60 mL/min (ref >60)
Glucose, Bld: 135 mg/dL — ABNORMAL HIGH (ref 70–99)
POTASSIUM: 3.8 mmol/L (ref 3.5–5.1)
SODIUM: 139 mmol/L (ref 135–145)

## 2017-12-18 LAB — CREATININE, SERUM
Creatinine, Ser: 1.72 mg/dL — ABNORMAL HIGH (ref 0.61–1.24)
GFR, EST AFRICAN AMERICAN: 44 mL/min — AB (ref >60)
GFR, EST NON AFRICAN AMERICAN: 38 mL/min — AB (ref >60)

## 2017-12-18 LAB — POCT I-STAT, CHEM 8
BUN: 20 mg/dL (ref 8–23)
CHLORIDE: 99 mmol/L (ref 98–111)
CREATININE: 1.6 mg/dL — AB (ref 0.61–1.24)
Calcium, Ion: 1.1 mmol/L — ABNORMAL LOW (ref 1.15–1.40)
GLUCOSE: 155 mg/dL — AB (ref 70–99)
HCT: 24 % — ABNORMAL LOW (ref 39.0–52.0)
Hemoglobin: 8.2 g/dL — ABNORMAL LOW (ref 13.0–17.0)
Potassium: 4.2 mmol/L (ref 3.5–5.1)
Sodium: 137 mmol/L (ref 135–145)
TCO2: 23 mmol/L (ref 22–32)

## 2017-12-18 LAB — MAGNESIUM
MAGNESIUM: 2.6 mg/dL — AB (ref 1.7–2.4)
MAGNESIUM: 2.6 mg/dL — AB (ref 1.7–2.4)

## 2017-12-18 MED ORDER — FUROSEMIDE 10 MG/ML IJ SOLN
20.0000 mg | Freq: Once | INTRAMUSCULAR | Status: AC
  Administered 2017-12-18: 20 mg via INTRAVENOUS
  Filled 2017-12-18: qty 2

## 2017-12-18 MED ORDER — POTASSIUM CHLORIDE 10 MEQ/50ML IV SOLN
10.0000 meq | INTRAVENOUS | Status: AC
  Administered 2017-12-18 (×3): 10 meq via INTRAVENOUS
  Filled 2017-12-18 (×3): qty 50

## 2017-12-18 MED ORDER — KETOROLAC TROMETHAMINE 15 MG/ML IJ SOLN
15.0000 mg | Freq: Four times a day (QID) | INTRAMUSCULAR | Status: DC
  Administered 2017-12-18 (×2): 15 mg via INTRAVENOUS
  Filled 2017-12-18 (×2): qty 1

## 2017-12-18 MED ORDER — INSULIN ASPART 100 UNIT/ML ~~LOC~~ SOLN
0.0000 [IU] | SUBCUTANEOUS | Status: DC
  Administered 2017-12-18 – 2017-12-19 (×7): 2 [IU] via SUBCUTANEOUS

## 2017-12-18 MED FILL — Heparin Sodium (Porcine) Inj 1000 Unit/ML: INTRAMUSCULAR | Qty: 30 | Status: AC

## 2017-12-18 MED FILL — Heparin Sodium (Porcine) Inj 1000 Unit/ML: INTRAMUSCULAR | Qty: 2500 | Status: AC

## 2017-12-18 MED FILL — Potassium Chloride Inj 2 mEq/ML: INTRAVENOUS | Qty: 40 | Status: AC

## 2017-12-18 MED FILL — Magnesium Sulfate Inj 50%: INTRAMUSCULAR | Qty: 10 | Status: AC

## 2017-12-18 NOTE — Progress Notes (Signed)
Pt c/o nausea while walking and looked like he felt a little light headed.  Pt sat down in chair and zofran was administered. Pt thinks it may have been due to the food he ate at lunch. He ambulated the entire unit 370 ft and is back in bed visiting with family at bedside.

## 2017-12-18 NOTE — Progress Notes (Signed)
      [address]       [place] [address]             [phone]      POD # 1 AVR  Nausea earlier  BP (!) 94/58   Pulse 84   Temp 97.9 F (36.6 C) (Oral)   Resp (!) 34   Ht 5\' 8"  (1.727 m)   Wt 196 lb 10.4 oz (89.2 kg)   SpO2 (!) 87%   BMI 29.90 kg/m    4L [place]= 93%  Intake/Output Summary (Last 24 hours) at 12/18/2017 1831 Last data filed at 12/18/2017 1800 Gross per 24 hour  Intake 1697.56 ml  Output 1945 ml  Net -247.44 ml   Creatinine 1.6 Hct= 24  Low UO  Will bladder scan and replace Foley if needed Dc Toradol  Remo Lipps C. Roxan Hockey, MD [facility] [phone]

## 2017-12-18 NOTE — Progress Notes (Signed)
Per rounding TCTS surgeon, if patient requires I&O, it is OK to place foley since pt has had low UOP and creat is up.

## 2017-12-18 NOTE — Progress Notes (Signed)
[address]       [place] [address]             [phone]        CARDIOTHORACIC SURGERY PROGRESS NOTE   R1 Day Post-Op Procedure(s) (LRB): MINIMALLY INVASIVE AORTIC VALVE REPLACEMENT (AVR) [ using Edwards Intuity Valve Size 71mm (N/A) TRANSESOPHAGEAL ECHOCARDIOGRAM (TEE) (N/A)  Subjective: Looks great.  Feels sore in chest and upper back.  No SOB  Objective: Vital signs: BP Readings from Last 1 Encounters:  12/18/17 (!) 101/54   Pulse Readings from Last 1 Encounters:  12/18/17 80   Resp Readings from Last 1 Encounters:  12/18/17 (!) 27   Temp Readings from Last 1 Encounters:  12/18/17 99.1 F (37.3 C)    Hemodynamics: PAP: (11-34)/(-4-14) 25/8 CO:  [4.2 L/min-8.7 L/min] 8.7 L/min CI:  [2.1 L/min/m2-4.3 L/min/m2] 4.3 L/min/m2  Physical Exam:  Rhythm:   sinus  Breath sounds: clear  Heart sounds:  RRR  Incisions:  Dressings dry, intact  Abdomen:  Soft, non-distended, non-tender  Extremities:  Warm, well-perfused  Chest tubes:  low volume thin serosanguinous output, no air leak    Intake/Output from previous day: 07/10 0701 - 07/11 0700 In: 6385.8 [I.V.:5072.9; Blood:225; IV Piggyback:1087.9] Out: 9147 [Urine:3150; Blood:650; Chest Tube:750] Intake/Output this shift: No intake/output data recorded.  Lab Results:  CBC: Recent Labs    12/17/17 2030 12/17/17 2046 12/18/17 0334  WBC 7.5  --  5.6  HGB 9.0* 8.5* 8.2*  HCT 27.7* 25.0* 25.1*  PLT 132*  --  109*    BMET:  Recent Labs    12/15/17 1153  12/17/17 2046 12/18/17 0334  NA 141   < > 142 139  K 3.6   < > 3.8 3.8  CL 106   < > 106 107  CO2 24  --   --  23  GLUCOSE 115*   < > 108* 135*  BUN 20   < > 14 14  CREATININE 1.21   < > 1.00 1.18  CALCIUM 9.9  --   --  7.2*   < > = values in this interval not displayed.     PT/INR:   Recent Labs    12/17/17 1449  LABPROT 18.3*  INR 1.53    CBG (last 3)  Recent Labs    12/17/17 2045 12/17/17 2128  12/17/17 2233  GLUCAP 105* 109* 98    ABG    Component Value Date/Time   PHART 7.457 (H) 12/17/2017 1105   PCO2ART 37.4 12/17/2017 1105   PO2ART 406.0 (H) 12/17/2017 1105   HCO3 26.4 12/17/2017 1105   TCO2 24 12/17/2017 2046   O2SAT 100.0 12/17/2017 1105    CXR: Looks clear, mild bibasilar ATX  EKG: NSR w/out acute ischemic changes, LBBB (new)   Assessment/Plan: S/P Procedure(s) (LRB): MINIMALLY INVASIVE AORTIC VALVE REPLACEMENT (AVR) [ using Edwards Intuity Valve Size 70mm (N/A) TRANSESOPHAGEAL ECHOCARDIOGRAM (TEE) (N/A)  Overall doing very well POD1 Maintaining NSR w/ stable hemodynamics, no drips Breathing comfortably w/ O2 sats 97% on 3 L/min and CXR looks good Expected post op acute blood loss anemia, Hgb 8.2 this morning Expected post op atelectasis, very mild Chronic diastolic CHF with expected post-op volume excess, weight reportedly 7 lbs > preop LBBB (new) Post op thrombocytopenia, mild   Mobilize  Diuresis  D/C lines  Add toradol for pain control  Hold beta blockers for now  Watch anemia   Rexene Alberts, MD 12/18/2017 8:13  AM

## 2017-12-19 ENCOUNTER — Inpatient Hospital Stay (HOSPITAL_COMMUNITY): Payer: Medicare Other

## 2017-12-19 LAB — CBC
HEMATOCRIT: 25.6 % — AB (ref 39.0–52.0)
Hemoglobin: 8.2 g/dL — ABNORMAL LOW (ref 13.0–17.0)
MCH: 31.1 pg (ref 26.0–34.0)
MCHC: 32 g/dL (ref 30.0–36.0)
MCV: 97 fL (ref 78.0–100.0)
PLATELETS: 133 10*3/uL — AB (ref 150–400)
RBC: 2.64 MIL/uL — AB (ref 4.22–5.81)
RDW: 13.7 % (ref 11.5–15.5)
WBC: 9.6 10*3/uL (ref 4.0–10.5)

## 2017-12-19 LAB — BASIC METABOLIC PANEL
Anion gap: 6 (ref 5–15)
BUN: 22 mg/dL (ref 8–23)
CHLORIDE: 104 mmol/L (ref 98–111)
CO2: 26 mmol/L (ref 22–32)
CREATININE: 1.6 mg/dL — AB (ref 0.61–1.24)
Calcium: 7.5 mg/dL — ABNORMAL LOW (ref 8.9–10.3)
GFR calc non Af Amer: 42 mL/min — ABNORMAL LOW (ref >60)
GFR, EST AFRICAN AMERICAN: 48 mL/min — AB (ref >60)
Glucose, Bld: 132 mg/dL — ABNORMAL HIGH (ref 70–99)
POTASSIUM: 3.8 mmol/L (ref 3.5–5.1)
Sodium: 136 mmol/L (ref 135–145)

## 2017-12-19 LAB — GLUCOSE, CAPILLARY
GLUCOSE-CAPILLARY: 147 mg/dL — AB (ref 70–99)
Glucose-Capillary: 140 mg/dL — ABNORMAL HIGH (ref 70–99)

## 2017-12-19 MED ORDER — SODIUM CHLORIDE 0.9% FLUSH: 3.0000 mL | INTRAVENOUS | Status: DC | PRN

## 2017-12-19 MED ORDER — SODIUM CHLORIDE 0.9 % IV SOLN: 250.0000 mL | INTRAVENOUS | Status: DC | PRN

## 2017-12-19 MED ORDER — POTASSIUM CHLORIDE 10 MEQ/50ML IV SOLN
10.0000 meq | INTRAVENOUS | Status: AC
  Administered 2017-12-19 (×3): 10 meq via INTRAVENOUS
  Filled 2017-12-19 (×3): qty 50

## 2017-12-19 MED ORDER — METOCLOPRAMIDE HCL 5 MG/ML IJ SOLN
10.0000 mg | Freq: Four times a day (QID) | INTRAMUSCULAR | Status: AC
  Administered 2017-12-19 – 2017-12-20 (×3): 10 mg via INTRAVENOUS
  Filled 2017-12-19 (×5): qty 2

## 2017-12-19 MED ORDER — MOVING RIGHT ALONG BOOK
Freq: Once | Status: AC
  Administered 2017-12-19: 07:00:00
  Filled 2017-12-19: qty 1

## 2017-12-19 MED ORDER — SODIUM CHLORIDE 0.9% FLUSH
3.0000 mL | Freq: Two times a day (BID) | INTRAVENOUS | Status: DC
  Administered 2017-12-19: 3 mL via INTRAVENOUS
  Administered 2017-12-19: 10 mL via INTRAVENOUS
  Administered 2017-12-20 – 2017-12-22 (×6): 3 mL via INTRAVENOUS

## 2017-12-19 MED ORDER — FUROSEMIDE 10 MG/ML IJ SOLN
20.0000 mg | Freq: Once | INTRAMUSCULAR | Status: AC
  Administered 2017-12-19: 20 mg via INTRAVENOUS
  Filled 2017-12-19: qty 2

## 2017-12-19 MED ORDER — ROSUVASTATIN CALCIUM 10 MG PO TABS
20.0000 mg | ORAL_TABLET | Freq: Every day | ORAL | Status: DC
  Administered 2017-12-20 – 2017-12-23 (×4): 20 mg via ORAL
  Filled 2017-12-19 (×4): qty 2

## 2017-12-19 MED FILL — Lidocaine HCl(Cardiac) IV PF Soln Pref Syr 100 MG/5ML (2%): INTRAVENOUS | Qty: 25 | Status: AC

## 2017-12-19 MED FILL — Mannitol IV Soln 20%: INTRAVENOUS | Qty: 1000 | Status: AC

## 2017-12-19 MED FILL — Sodium Chloride IV Soln 0.9%: INTRAVENOUS | Qty: 2000 | Status: AC

## 2017-12-19 MED FILL — Heparin Sodium (Porcine) Inj 1000 Unit/ML: INTRAMUSCULAR | Qty: 10 | Status: AC

## 2017-12-19 MED FILL — Sodium Bicarbonate IV Soln 8.4%: INTRAVENOUS | Qty: 50 | Status: AC

## 2017-12-19 MED FILL — Electrolyte-R (PH 7.4) Solution: INTRAVENOUS | Qty: 4000 | Status: AC

## 2017-12-19 NOTE — Discharge Instructions (Signed)
Aortic Valve Replacement, Care After °Refer to this sheet in the next few weeks. These instructions provide you with information about caring for yourself after your procedure. Your health care provider may also give you more specific instructions. Your treatment has been planned according to current medical practices, but problems sometimes occur. Call your health care provider if you have any problems or questions after your procedure. °What can I expect after the procedure? °After the procedure, it is common to have: °· Pain around your incision area. °· A small amount of blood or clear fluid coming from your incision. ° °Follow these instructions at home: °Eating and drinking ° °· Follow instructions from your health care provider about eating or drinking restrictions. °? Limit alcohol intake to no more than 1 drink per day for nonpregnant women and 2 drinks per day for men. One drink equals 12 oz of beer, 5 oz of wine, or 1½ oz of hard liquor. °? Limit how much caffeine you drink. Caffeine can affect your heart's rate and rhythm. °· Drink enough fluid to keep your urine clear or pale yellow. °· Eat a heart-healthy diet. This should include plenty of fresh fruits and vegetables. If you eat meat, it should be lean cuts. Avoid foods that are: °? High in salt, saturated fat, or sugar. °? Canned or highly processed. °? Fried. °Activity °· Return to your normal activities as told by your health care provider. Ask your health care provider what activities are safe for you. °· Exercise regularly once you have recovered, as told by your health care provider. °· Avoid sitting for more than 2 hours at a time without moving. Get up and move around at least once every 1-2 hours. This helps to prevent blood clots in the legs. °· Do not lift anything that is heavier than 10 lb (4.5 kg) until your health care provider approves. °· Avoid pushing or pulling things with your arms until your health care provider approves. This  includes pulling on handrails to help you climb stairs. °Incision care ° °· Follow instructions from your health care provider about how to take care of your incision. Make sure you: °? Wash your hands with soap and water before you change your bandage (dressing). If soap and water are not available, use hand sanitizer. °? Change your dressing as told by your health care provider. °? Leave stitches (sutures), skin glue, or adhesive strips in place. These skin closures may need to stay in place for 2 weeks or longer. If adhesive strip edges start to loosen and curl up, you may trim the loose edges. Do not remove adhesive strips completely unless your health care provider tells you to do that. °· Check your incision area every day for signs of infection. Check for: °? More redness, swelling, or pain. °? More fluid or blood. °? Warmth. °? Pus or a bad smell. °Medicines °· Take over-the-counter and prescription medicines only as told by your health care provider. °· If you were prescribed an antibiotic medicine, take it as told by your health care provider. Do not stop taking the antibiotic even if you start to feel better. °Travel °· Avoid airplane travel for as long as told by your health care provider. °· When you travel, bring a list of your medicines and a record of your medical history with you. Carry your medicines with you. °Driving °· Ask your health care provider when it is safe for you to drive. Do not drive until your health   care provider approves. °· Do not drive or operate heavy machinery while taking prescription pain medicine. °Lifestyle ° °· Do not use any tobacco products, such as cigarettes, chewing tobacco, or e-cigarettes. If you need help quitting, ask your health care provider. °· Resume sexual activity as told by your health care provider. Do not use medicines for erectile dysfunction unless your health care provider approves, if this applies. °· Work with your health care provider to keep your  blood pressure and cholesterol under control, and to manage any other heart conditions that you have. °· Maintain a healthy weight. °General instructions °· Do not take baths, swim, or use a hot tub until your health care provider approves. °· Do not strain to have a bowel movement. °· Avoid crossing your legs while sitting down. °· Check your temperature every day for a fever. A fever may be a sign of infection. °· If you are a woman and you plan to become pregnant, talk with your health care provider before you become pregnant. °· Wear compression stockings if your health care provider instructs you to do this. These stockings help to prevent blood clots and reduce swelling in your legs. °· Tell all health care providers who care for you that you have an artificial (prosthetic) aortic valve. If you have or have had heart disease or endocarditis, tell all health care providers about these conditions as well. °· Keep all follow-up visits as told by your health care provider. This is important. °Contact a health care provider if: °· You develop a skin rash. °· You experience sudden, unexplained changes in your weight. °· You have more redness, swelling, or pain around your incision. °· You have more fluid or blood coming from your incision. °· Your incision feels warm to the touch. °· You have pus or a bad smell coming from your incision. °· You have a fever. °Get help right away if: °· You develop chest pain that is different from the pain coming from your incision. °· You develop shortness of breath or difficulty breathing. °· You start to feel light-headed. °These symptoms may represent a serious problem that is an emergency. Do not wait to see if the symptoms will go away. Get medical help right away. Call your local emergency services (911 in the [place] Do not drive yourself to the hospital. °This information is not intended to replace advice given to you by your health care provider. Make sure you discuss any  questions you have with your health care provider. °Document Released: 12/13/2004 Document Revised: 11/02/2015 Document Reviewed: 04/30/2015 °Elsevier Interactive Patient Education © 2017 [employer] ° °

## 2017-12-19 NOTE — Discharge Summary (Signed)
Physician Discharge Summary  Patient ID: Eric [name] MRN: [id number] DOB/AGE: Nov 04, 1945 72 y.o.  Admit date: 12/17/2017 Discharge date: 12/24/2017  Admission Diagnoses: Patient Active Problem List   Diagnosis Date Noted  . 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 Abnormal LFTs   . Macular edema   . PCP NOTES >>>>>> 04/27/2015  . Hyperglycemia 10/20/2014  . Dizziness 04/21/2014  . Annual physical exam 04/15/2011  . Aortic stenosis 04/15/2011  . Hypothyroidism 10/13/2007  . Hyperlipidemia 10/13/2007  . 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  Patient states that he was first noted to have a heart murmur on routine physical exam more than 15 years ago. He has been followed regularly by his primary care physician, Dr. Kathlene November.Echocardiogram performed in 2011 reportedly revealed mild aortic stenosis with normal left ventricular function. Over the past 6 months the patient has developed progressive symptoms of exertional shortness of breath and fatigue. Follow-up transthoracic echocardiogram performed September 08, 2017 revealed severe aortic stenosis with preserved left ventricular systolic function. Peak velocity across aortic valve was reported 4.1 m/s corresponding to mean transvalvular gradient estimated 39 mmHg. The DVI was reported 0.22 with aortic valve area calculated 0.69 cm. Patient was referred for cardiology consultation and evaluated recently by Dr. Bettina Gavia. He was subsequently referred to the  multidisciplinary heart valve clinic and underwent diagnostic cardiac catheterization by Dr. Burt Knack on Oct 16, 2017. Catheterization revealed widely patent coronary arteries with no significant coronary artery disease. There are normal right-sided pressures. CT angiography was performed and the patient was referred for elective surgical consultation.  The patient is married and lives locally in [place] with his wife. 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 He has remained entirely functionally independent and reasonably active physically although he admits that he does not exercise regularly. He states that he has not had any significant physical problems until over the past 6 months when he has developed progressive symptoms of exertional shortness of breath and fatigue. He now gets short of breath intermittently with relatively low level activity and occasionally if he tries to lay flat in bed. He has had frequent dizzy spells without any syncopal episodes. He denies any history of chest pain or chest tightness either with activity or at rest. He has had some mild lower extremity edema.  Patient returns the office today with tentative plans to proceed with aortic valve replacement later this week. He was originally seen in consultation on November 11, 2017. He reports no new problems or complaints.    Hospital Course:   On 12/17/2017 Mr. Cada underwent a minimally invasive aortic valve replacement with Dr. Roxy Manns.  He tolerated the procedure well and was transferred to the surgical ICU for continued care.  He was extubated in a timely manner. Postop day 1 he continued to progress.  We discontinued his lines began to mobilize the patient.  We added Toradol for pain control.  He remained hemodynamically stable on no vasodilators.  He did have some expected acute blood loss anemia.  He also  had some expected  postoperative volume excess.  He was diagnosed with a new left bundle branch block and we held beta-blockers at this time.  Postop day 2 we discontinued his chest tubes.  He did have some postoperative nausea and vomiting, therefore we continue clear liquids and watch his abdominal exam.  We ordered a KUB for further evaluation.  We started the patient on Reglan.  Postop day 3 he went into atrial fibrillation with RVR.  He spontaneously converted without treatment.  We started a low-dose metoprolol and continue to monitor his rhythm closely.  We continue to wean his oxygen requirements.  Postop day 4 we were able to increase his metoprolol for better heart rate and rhythm control.  We were able to advance his diet since he had not had any nausea or vomiting for over 12 hours.  Postop day 5 we discontinued his epicardial pacing wires since his rhythm had remained stable.  We continued a diuretic regimen for fluid overload.  Postop day 6 we increase the metoprolol to 50 mg twice daily.  We held off on starting his losartan/HCTZ for now.  He now is down to his baseline weight therefore we did not discharge him on any diuretics.  Today, he was ambulating with limited assistance, his incisions were healing well, he was tolerating room air, and he is ready for discharge home.   Consults: None  Significant Diagnostic Studies:   CLINICAL DATA:  Chest pain  EXAM: PORTABLE CHEST 1 VIEW  COMPARISON:  December 18, 2017  FINDINGS: Swan-Ganz catheter has been removed. Cordis tip is in the left innominate vein. There is a chest tube on the right. Temporary pacemaker wires are attached to the right heart. There is no evident pneumothorax. There is bibasilar atelectasis. No consolidation. Heart is mildly enlarged with pulmonary vascularity normal. Patient is status post aortic valve replacement. Postoperative changes noted in the right medial mid thorax with screw and plate fixation.  IMPRESSION: Bibasilar  atelectasis. Tube and catheter positions as described without pneumothorax. Stable cardiac prominence. No new opacity.   Electronically Signed   By: Lowella Grip III M.D.   On: 12/19/2017 08:24  Treatments:  CARDIOTHORACIC SURGERY OPERATIVE NOTE  Date of Procedure:                12/17/2017  Preoperative Diagnosis:      Severe Aortic Stenosis   Postoperative Diagnosis:    Same   Procedure:        Minimally Invasive Aortic Valve Replacement Right Anterior Mini-thoracotomy Edwards Intuity Elite Rapid Deployment Stented Bovine Pericardial Tissue Valve (size 23 mm, catalog #[id number], serial #[id number]) Plating of right 2nd costal cartilage (KLS titanium plate)              Surgeon:        Valentina Gu. Roxy Manns, MD  Assistant:       Nicholes Rough, PA-C  Anesthesia:    Midge Minium, MD  Operative Findings: ? Severe aortic stenosis ? Mild aortic insufficiency ? Normal LV systolic function      Discharge Exam: Blood pressure (!) 156/84, pulse (!) 118, temperature 99.5 F (37.5 C), temperature source Oral, resp. rate (!) 23, height 5\' 8"  (1.727 m), weight 85.6 kg (188 lb 11.2 oz), SpO2 98 %.   General appearance: alert, cooperative and no distress Heart: regular rate and rhythm, S1, S2 normal, no murmur, click, rub or gallop Lungs: clear to auscultation bilaterally Abdomen: soft, non-tender; bowel sounds normal; no masses,  no organomegaly Extremities: extremities normal, atraumatic, no cyanosis or edema Wound: clean and dry    Disposition: Discharge disposition: 01-Home or Self Care       Discharge Instructions    Discharge patient   Complete by:  As directed    Discharge disposition:  01-Home or Self Care   Discharge patient date:  12/23/2017     Allergies as of 12/23/2017      Reactions   Atorvastatin Other (See Comments)   REACTION: myalgia   Ezetimibe Other (See Comments)   REACTION: myalgia      Medication List    STOP taking these  medications   aspirin 81 MG tablet Replaced by:  aspirin 325 MG EC tablet   diazepam 5 MG tablet Commonly known as:  VALIUM   losartan-hydrochlorothiazide 100-12.5 MG tablet Commonly known as:  HYZAAR   NIFEdipine 30 MG 24 hr tablet Commonly known as:  PROCARDIA-XL/ADALAT-CC/NIFEDICAL-XL   tobramycin 0.3 % ophthalmic solution Commonly known as:  TOBREX     TAKE these medications   aspirin 325 MG EC tablet Take 1 tablet (325 mg total) by mouth daily. Replaces:  aspirin 81 MG tablet   levothyroxine 88 MCG tablet Commonly known as:  SYNTHROID, LEVOTHROID Take 1 tablet (88 mcg total) by mouth daily before breakfast.   metoprolol tartrate 50 MG tablet Commonly known as:  LOPRESSOR Take 1 tablet (50 mg total) by mouth 2 (two) times daily.   oxyCODONE 5 MG immediate release tablet Commonly known as:  Oxy IR/ROXICODONE Take 1 tablet (5 mg total) by mouth every 6 (six) hours as needed for severe pain.   rosuvastatin 20 MG tablet Commonly known as:  CRESTOR Take 1 tablet (20 mg total) by mouth daily.      Follow-up Information    Colon Branch, MD. Call in 1 day(s).   Specialty:  Internal Medicine Contact information: [address] [place] [address] [phone]        Rexene Alberts, MD Follow up.   Specialty:  Cardiothoracic Surgery Why:  Your routine follow-up appointment is on 01/12/2018 at 2:30pm. Please arrive at 2:00pm for a chest xray which is located at [facility] which is on the first floor of our building Contact information: [address] [place] [address] [phone]        [name], MD Follow up.   Specialty:  Cardiology Why:  Dr. Bettina Gavia 7/24 @4 :20 pm ([facility]).  Contact information: [place] [address] [phone]          The patient has been discharged on:   1.Beta Blocker:  Yes [  X ]                              No   [   ]                               If No, reason:  2.Ace Inhibitor/ARB: Yes [   ]                                     No  [ X   ]  If No, reason: titration of beta blocker  3.Statin:   Yes [ x  ]                  No  [   ]                  If No, reason:  4.Ecasa:  Yes  [ x  ]                  No   [   ]                  If No, reason:    Signed: Elgie Collard 12/24/2017, 12:26 PM

## 2017-12-19 NOTE — Plan of Care (Signed)
  Problem: Health Behavior/Discharge Planning: Goal: Ability to manage health-related needs will improve Outcome: Progressing   Problem: Clinical Measurements: Goal: Diagnostic test results will improve Outcome: Progressing Goal: Respiratory complications will improve Outcome: Progressing Goal: Cardiovascular complication will be avoided Outcome: Progressing   Problem: Elimination: Goal: Will not experience complications related to urinary retention Outcome: Progressing   Problem: Activity: Goal: Risk for activity intolerance will decrease Outcome: Progressing   Problem: Skin Integrity: Goal: Wound healing without signs and symptoms of infection Outcome: Progressing   Problem: Nutrition: Goal: Adequate nutrition will be maintained Outcome: Not Progressing   Problem: Elimination: Goal: Will not experience complications related to bowel motility Outcome: Not Progressing

## 2017-12-19 NOTE — Plan of Care (Signed)
  Problem: Health Behavior/Discharge Planning: Goal: Ability to manage health-related needs will improve Outcome: Progressing   Problem: Clinical Measurements: Goal: Ability to maintain clinical measurements within normal limits will improve Outcome: Progressing Goal: Diagnostic test results will improve Outcome: Progressing Goal: Respiratory complications will improve Outcome: Progressing Goal: Cardiovascular complication will be avoided Outcome: Progressing   Problem: Activity: Goal: Risk for activity intolerance will decrease Outcome: Progressing   Problem: Nutrition: Goal: Adequate nutrition will be maintained Outcome: Progressing   Problem: Elimination: Goal: Will not experience complications related to urinary retention Outcome: Progressing   Problem: Education: Goal: Knowledge of the prescribed therapeutic regimen will improve Outcome: Progressing   Problem: Activity: Goal: Risk for activity intolerance will decrease Outcome: Progressing   Problem: Clinical Measurements: Goal: Postoperative complications will be avoided or minimized Outcome: Progressing   Problem: Respiratory: Goal: Respiratory status will improve Outcome: Progressing   Problem: Skin Integrity: Goal: Wound healing without signs and symptoms of infection Outcome: Progressing Goal: Risk for impaired skin integrity will decrease Outcome: Progressing   Problem: Urinary Elimination: Goal: Ability to achieve and maintain adequate renal perfusion and functioning will improve Outcome: Progressing

## 2017-12-19 NOTE — Progress Notes (Signed)
[address]       [place] [address]             [phone]        CARDIOTHORACIC SURGERY PROGRESS NOTE   R2 Days Post-Op Procedure(s) (LRB): MINIMALLY INVASIVE AORTIC VALVE REPLACEMENT (AVR) [ using [employer] Valve Size 63mm (N/A) TRANSESOPHAGEAL ECHOCARDIOGRAM (TEE) (N/A)  Subjective: Looks good and feels well.  Minimal pain.  Some nausea overnight.  Denies abdominal pain  Objective: Vital signs: BP Readings from Last 1 Encounters:  12/19/17 (!) 119/57   Pulse Readings from Last 1 Encounters:  12/19/17 75   Resp Readings from Last 1 Encounters:  12/19/17 (!) 24   Temp Readings from Last 1 Encounters:  12/19/17 97.8 F (36.6 C) (Oral)    Hemodynamics: PAP: (37)/(21) 37/21 CO:  [6.4 L/min] 6.4 L/min CI:  [3.1 L/min/m2] 3.1 L/min/m2  Physical Exam:  Rhythm:   sinus  Breath sounds: clear  Heart sounds:  RRR  Incisions:  Dressings dry, intact  Abdomen:  Soft, non-distended, non-tender  Extremities:  Warm, well-perfused  Chest tubes:  decreasing volume thin serosanguinous output, no air leak    Intake/Output from previous day: 07/11 0701 - 07/12 0700 In: 1890 [P.O.:1440; IV Piggyback:450] Out: 1090 [Urine:650; Chest Tube:440] Intake/Output this shift: No intake/output data recorded.  Lab Results:  CBC: Recent Labs    12/18/17 1707 12/18/17 1725 12/19/17 0429  WBC 11.2*  --  9.6  HGB 8.4* 8.2* 8.2*  HCT 26.6* 24.0* 25.6*  PLT 142*  --  133*    BMET:  Recent Labs    12/18/17 0334  12/18/17 1725 12/19/17 0429  NA 139  --  137 136  K 3.8  --  4.2 3.8  CL 107  --  99 104  CO2 23  --   --  26  GLUCOSE 135*  --  155* 132*  BUN 14  --  20 22  CREATININE 1.18   < > 1.60* 1.60*  CALCIUM 7.2*  --   --  7.5*   < > = values in this interval not displayed.     PT/INR:   Recent Labs    12/17/17 1449  LABPROT 18.3*  INR 1.53    CBG (last 3)  Recent Labs    12/18/17 2038 12/19/17 0011 12/19/17 0346  GLUCAP  130* 140* 147*    ABG    Component Value Date/Time   PHART 7.298 (L) 12/17/2017 1518   PCO2ART 50.0 (H) 12/17/2017 1518   PO2ART 94.0 12/17/2017 1518   HCO3 24.6 12/17/2017 1518   TCO2 23 12/18/2017 1725   ACIDBASEDEF 2.0 12/17/2017 1518   O2SAT 96.0 12/17/2017 1518    CXR: Clear. Some gaseous distension of stomach  Assessment/Plan: S/P Procedure(s) (LRB): MINIMALLY INVASIVE AORTIC VALVE REPLACEMENT (AVR) [ using [employer] Valve Size 48mm (N/A) TRANSESOPHAGEAL ECHOCARDIOGRAM (TEE) (N/A)  Doing well POD2 Maintaining NSR w/ stable BP Breathing comfortably w/ O2 sats 92% on 2 L/min Expected post op acute blood loss anemia, Hgb stable 8.2  Expected post op atelectasis, very mild Chronic diastolic CHF with expected post-op volume excess, weight up and reportedly 12 lbs > preop Post op elevated serum creatinine, likely due to prerenal azotemia +/- acute kidney injury caused by ATN, UOP adequate LBBB (new) Post op thrombocytopenia, mild   Mobilize  Diuresis D/C tubes later today or tomorrow, depending on output Restart low dose metoprolol in 1-2 days and watch for increased heart  block if rhythm stable Try Reglan Continue clear liquids for now and watch abdominal exam Transfer 4E   Rexene Alberts, MD 12/19/2017 8:15 AM

## 2017-12-20 ENCOUNTER — Inpatient Hospital Stay (HOSPITAL_COMMUNITY): Payer: Medicare Other

## 2017-12-20 LAB — CBC
HCT: 29.8 % — ABNORMAL LOW (ref 39.0–52.0)
Hemoglobin: 9.5 g/dL — ABNORMAL LOW (ref 13.0–17.0)
MCH: 31 pg (ref 26.0–34.0)
MCHC: 31.9 g/dL (ref 30.0–36.0)
MCV: 97.4 fL (ref 78.0–100.0)
PLATELETS: 221 10*3/uL (ref 150–400)
RBC: 3.06 MIL/uL — AB (ref 4.22–5.81)
RDW: 13.5 % (ref 11.5–15.5)
WBC: 12.6 10*3/uL — ABNORMAL HIGH (ref 4.0–10.5)

## 2017-12-20 LAB — BASIC METABOLIC PANEL
ANION GAP: 8 (ref 5–15)
BUN: 15 mg/dL (ref 8–23)
CALCIUM: 8.1 mg/dL — AB (ref 8.9–10.3)
CO2: 27 mmol/L (ref 22–32)
Chloride: 103 mmol/L (ref 98–111)
Creatinine, Ser: 1.26 mg/dL — ABNORMAL HIGH (ref 0.61–1.24)
GFR calc Af Amer: >60 mL/min (ref >60)
GFR, EST NON AFRICAN AMERICAN: 56 mL/min — AB (ref >60)
GLUCOSE: 141 mg/dL — AB (ref 70–99)
Potassium: 3.9 mmol/L (ref 3.5–5.1)
SODIUM: 138 mmol/L (ref 135–145)

## 2017-12-20 LAB — MAGNESIUM: MAGNESIUM: 2.4 mg/dL (ref 1.7–2.4)

## 2017-12-20 MED ORDER — AMIODARONE LOAD VIA INFUSION
150.0000 mg | Freq: Once | INTRAVENOUS | Status: DC
  Filled 2017-12-20: qty 83.34

## 2017-12-20 MED ORDER — AMIODARONE HCL IN DEXTROSE 360-4.14 MG/200ML-% IV SOLN: 60.0000 mg/h | INTRAVENOUS | Status: DC

## 2017-12-20 MED ORDER — AMIODARONE HCL IN DEXTROSE 360-4.14 MG/200ML-% IV SOLN
INTRAVENOUS | Status: AC
  Filled 2017-12-20: qty 200

## 2017-12-20 MED ORDER — METOPROLOL TARTRATE 12.5 MG HALF TABLET
12.5000 mg | ORAL_TABLET | Freq: Two times a day (BID) | ORAL | Status: DC
  Administered 2017-12-20 (×2): 12.5 mg via ORAL
  Filled 2017-12-20 (×2): qty 1

## 2017-12-20 MED ORDER — AMIODARONE HCL IN DEXTROSE 360-4.14 MG/200ML-% IV SOLN: 30.0000 mg/h | INTRAVENOUS | Status: DC

## 2017-12-20 NOTE — Progress Notes (Signed)
Both CT removed per order. Pt tolerated well. Pt instructed on one hour bedrest. Will continue to monitor.  Clyde Canterbury, RN

## 2017-12-20 NOTE — Progress Notes (Addendum)
[address]       [place] [address]             [phone]      3 Days Post-Op Procedure(s) (LRB): MINIMALLY INVASIVE AORTIC VALVE REPLACEMENT (AVR) [ using [employer] Valve Size 17mm (N/A) TRANSESOPHAGEAL ECHOCARDIOGRAM (TEE) (N/A) Subjective: Feels nauseated this morning and isn't having much success with clears.   Objective: Vital signs in last 24 hours: Temp:  [97.9 F (36.6 C)-98.5 F (36.9 C)] 98.5 F (36.9 C) (07/13 0821) Pulse Rate:  [73-93] 80 (07/13 0821) Cardiac Rhythm: Normal sinus rhythm;Bundle branch block (07/13 0700) Resp:  [14-30] 17 (07/13 0821) BP: (104-150)/(51-73) 127/67 (07/13 0821) SpO2:  [91 %-96 %] 96 % (07/13 0821)     Intake/Output from previous day: 07/12 0701 - 07/13 0700 In: 510 [P.O.:360; IV Piggyback:150] Out: 1380 [Urine:1250; Chest Tube:130] Intake/Output this shift: Total I/O In: -  Out: 400 [Urine:400]  General appearance: alert, cooperative and no distress Heart: regular rate and rhythm, S1, S2 normal, no murmur, click, rub or gallop Lungs: clear to auscultation bilaterally Abdomen: soft, non-tender; bowel sounds normal; no masses,  no organomegaly Extremities: extremities normal, atraumatic, no cyanosis or edema Wound: clean and dry  Lab Results: Recent Labs    12/19/17 0429 12/20/17 0500  WBC 9.6 12.6*  HGB 8.2* 9.5*  HCT 25.6* 29.8*  PLT 133* 221   BMET:  Recent Labs    12/19/17 0429 12/20/17 0500  NA 136 138  K 3.8 3.9  CL 104 103  CO2 26 27  GLUCOSE 132* 141*  BUN 22 15  CREATININE 1.60* 1.26*  CALCIUM 7.5* 8.1*    PT/INR:  Recent Labs    12/17/17 1449  LABPROT 18.3*  INR 1.53   ABG    Component Value Date/Time   PHART 7.298 (L) 12/17/2017 1518   HCO3 24.6 12/17/2017 1518   TCO2 23 12/18/2017 1725   ACIDBASEDEF 2.0 12/17/2017 1518   O2SAT 96.0 12/17/2017 1518   CBG (last 3)  Recent Labs    12/18/17 2038 12/19/17 0011 12/19/17 0346  GLUCAP 130* 140* 147*     Assessment/Plan: S/P Procedure(s) (LRB): MINIMALLY INVASIVE AORTIC VALVE REPLACEMENT (AVR) [ using [employer] Valve Size 28mm (N/A) TRANSESOPHAGEAL ECHOCARDIOGRAM (TEE) (N/A)  1. CV-Afib with RVR this morning. Orders for Amio bolus and drip given but patient converted spontaneously without treatment. Now in NSR in the 80s with stable LBBB. Will start low-dose metoprolol and monitor rhythm closely.  2. Pulm-tolerating 2L [place]. CXR shows right pleural effusion with atelectasis. Await official read. 3. Renal-No patient weights since 7/11. Will reorder. Creatinine 1.26, electrolytes okay. Will order another Mag level.  4. Endo-blood glucose level well controlled on current regimen. Continue Synthroid for Hypothyroidism 5. GI-continue clear liquids. Still having n/v. KUB negative for obstruction. Continue reglan 6. H and H stable at 9.5/29.8, platelets 221k  Plan: Start low-dose BB for better rhythm control. Continue clears and treat nausea with zofran. Continue reglan for GI mobility. Hold off on Amio for now...this will likely not help his GI symptoms.    LOS: 3 days    Elgie Collard 12/20/2017  Cr decreased slightly Still with nausea Brief afib with rvr last night, converted before any rx, started beta blocker  D/c chest tubes Dg Chest 2 View  Result Date: 12/20/2017 CLINICAL DATA:  Status post aortic valve replacement. EXAM: CHEST - 2 VIEW COMPARISON:  12/19/2017 FINDINGS: The heart size and mediastinal contours  are stable. Stable appearance of prosthetic aortic valve and reconstruction plate after mini thoracotomy. Right chest tube remains without pneumothorax. Stable right basilar atelectasis. Slightly improved aeration of the left lung base. Tiny bilateral posterior pleural effusions. No pneumothorax. Stable appearance of epicardial pacing leads. IMPRESSION: Stable right basilar atelectasis. Improved aeration at the left lung base. Tiny bilateral posterior pleural effusions. No  pneumothorax. Electronically Signed   By: Aletta Edouard M.D.   On: 12/20/2017 09:47   Dg Abd 2 Views  Result Date: 12/20/2017 CLINICAL DATA:  Nausea and vomiting after aortic valve replacement surgery. EXAM: ABDOMEN - 2 VIEW COMPARISON:  None. FINDINGS: Scattered air in nondilated colon and small bowel without evidence of bowel obstruction or significant ileus. No free air identified. No abnormal calcifications. Bony structures are unremarkable. IMPRESSION: No significant findings. Electronically Signed   By: Aletta Edouard M.D.   On: 12/20/2017 09:49    I have seen and examined Eric [name] and agree with the above assessment  and plan.  Grace Isaac MD Beeper [phone] Office [phone] 12/20/2017 11:27 AM

## 2017-12-20 NOTE — Progress Notes (Signed)
  Amiodarone Drug - Drug Interaction Consult Note  Recommendations: Monitor for muscle pain/weakness.  Amiodarone is metabolized by the cytochrome P450 system and therefore has the potential to cause many drug interactions. Amiodarone has an average plasma half-life of 50 days (range 20 to 100 days).   There is potential for drug interactions to occur several weeks or months after stopping treatment and the onset of drug interactions may be slow after initiating amiodarone.   [x]  Statins: [ROSUVASTATIN] Increased risk of myopathy. Simvastatin- restrict dose to 20mg  daily. Other statins: counsel patients to report any muscle pain or weakness immediately.  []  Anticoagulants: Amiodarone can increase anticoagulant effect. Consider warfarin dose reduction. Patients should be monitored closely and the dose of anticoagulant altered accordingly, remembering that amiodarone levels take several weeks to stabilize.  []  Antiepileptics: Amiodarone can increase plasma concentration of phenytoin, the dose should be reduced. Note that small changes in phenytoin dose can result in large changes in levels. Monitor patient and counsel on signs of toxicity.  []  Beta blockers: increased risk of bradycardia, AV block and myocardial depression. Sotalol - avoid concomitant use.  []   Calcium channel blockers (diltiazem and verapamil): increased risk of bradycardia, AV block and myocardial depression.  []   Cyclosporine: Amiodarone increases levels of cyclosporine. Reduced dose of cyclosporine is recommended.  []  Digoxin dose should be halved when amiodarone is started.  []  Diuretics: increased risk of cardiotoxicity if hypokalemia occurs.  []  Oral hypoglycemic agents (glyburide, glipizide, glimepiride): increased risk of hypoglycemia. Patient's glucose levels should be monitored closely when initiating amiodarone therapy.   []  Drugs that prolong the QT interval:  Torsades de pointes risk may be increased with  concurrent use - avoid if possible.  Monitor QTc, also keep magnesium/potassium WNL if concurrent therapy can't be avoided. Marland Kitchen Antibiotics: e.g. fluoroquinolones, erythromycin. . Antiarrhythmics: e.g. quinidine, procainamide, disopyramide, sotalol. . Antipsychotics: e.g. phenothiazines, haloperidol.  . Lithium, tricyclic antidepressants, and methadone.   Thank you,  Wynona Neat, PharmD, BCPS  12/20/2017 5:21 AM

## 2017-12-20 NOTE — Progress Notes (Signed)
CARDIAC REHAB PHASE I   PRE:  Rate/Rhythm: 90 sinus rhythm  BP:  Supine:   Sitting: 140/68  Standing:    SaO2: 98% 2L  MODE:  Ambulation: 150  ft   POST:  Rate/Rhythem: 95 sinus, brief afib RVR-166 upon sitting  BP:  Supine:   Sitting: 136/78  Standing:    SaO2: 95% 2L  1058-1144 Pt ambulated in hallway x1 assist using rolling walker, Chest tube in place.  Pt asymptomatic. Pt denies N/V during rehab visit.  Pt returned to bed, call light in reach.  Brief afib RVR run upon returning to bed.  Pt asymptomatic, RN aware. Pt instructed in proper IS use. Understanding verbalized.     [employer]

## 2017-12-20 NOTE — Progress Notes (Signed)
While attempting to get patient's daily weight, patient sat on side of bed, became nauseous and started heaving bringing up clear sputum. Patient went into rapid afib with HR up to 180s. HR fluctuated between 150's-170's. Patient given 5mg  metoprolol and scheduled reglan. Notified Dr. Servando Snare. Orders for amio gtt given. Patient converted to sinus rhythm before amio gtt could be started. HR remains 80's. Patient resting at this time. Daily weight was not done at this time d/t rapid HR. amio supplies in room in case it's needed. Will continue to monitor.

## 2017-12-21 ENCOUNTER — Inpatient Hospital Stay (HOSPITAL_COMMUNITY): Payer: Medicare Other

## 2017-12-21 MED ORDER — METOPROLOL TARTRATE 25 MG PO TABS
25.0000 mg | ORAL_TABLET | Freq: Two times a day (BID) | ORAL | Status: DC
  Administered 2017-12-21 – 2017-12-22 (×4): 25 mg via ORAL
  Filled 2017-12-21 (×4): qty 1

## 2017-12-21 NOTE — Progress Notes (Signed)
Pt went to bathroom and went into a.fib RVR 160's sustaining. Pt only complaint is " a little tightness in chest". 5mg  IV metoprolol given. Pt converted to NSR rate 80-90's. Will continue to monitor.  Clyde Canterbury, RN

## 2017-12-21 NOTE — Progress Notes (Signed)
Patient HR sustaining in the 150's; PRN Metoprolol 5mg  given; VSS; Will continue to monitor

## 2017-12-21 NOTE — Progress Notes (Addendum)
      [address]       [name] [address]             [phone]      4 Days Post-Op Procedure(s) (LRB): MINIMALLY INVASIVE AORTIC VALVE REPLACEMENT (AVR) [ using [employer] Valve Size 65mm (N/A) TRANSESOPHAGEAL ECHOCARDIOGRAM (TEE) (N/A) Subjective: Feels better this morning. Asking to advance diet. Plans to be more active today.   Objective: Vital signs in last 24 hours: Temp:  [98.2 F (36.8 C)-98.4 F (36.9 C)] 98.3 F (36.8 C) (07/14 0432) Pulse Rate:  [82-95] 91 (07/14 0432) Cardiac Rhythm: Normal sinus rhythm;Bundle branch block (07/14 0700) Resp:  [19-28] 19 (07/14 0432) BP: (119-145)/(66-103) 127/67 (07/14 0432) SpO2:  [92 %-99 %] 94 % (07/14 0432) Weight:  [88 kg (194 lb 0.1 oz)-89.9 kg (198 lb 3.1 oz)] 88 kg (194 lb 0.1 oz) (07/14 0432)     Intake/Output from previous day: 07/13 0701 - 07/14 0700 In: -  Out: 820 [Urine:800; Chest Tube:20] Intake/Output this shift: No intake/output data recorded.  General appearance: alert, cooperative and no distress Heart: regular rate and rhythm, S1, S2 normal, no murmur, click, rub or gallop Lungs: clear to auscultation bilaterally Abdomen: soft, non-tender; bowel sounds normal; no masses,  no organomegaly Extremities: extremities normal, atraumatic, no cyanosis or edema Wound: clean and dry  Lab Results: Recent Labs    12/19/17 0429 12/20/17 0500  WBC 9.6 12.6*  HGB 8.2* 9.5*  HCT 25.6* 29.8*  PLT 133* 221   BMET:  Recent Labs    12/19/17 0429 12/20/17 0500  NA 136 138  K 3.8 3.9  CL 104 103  CO2 26 27  GLUCOSE 132* 141*  BUN 22 15  CREATININE 1.60* 1.26*  CALCIUM 7.5* 8.1*    PT/INR: No results for input(s): LABPROT, INR in the last 72 hours. ABG    Component Value Date/Time   PHART 7.298 (L) 12/17/2017 1518   HCO3 24.6 12/17/2017 1518   TCO2 23 12/18/2017 1725   ACIDBASEDEF 2.0 12/17/2017 1518   O2SAT 96.0 12/17/2017 1518   CBG (last 3)  Recent Labs     12/18/17 2038 12/19/17 0011 12/19/17 0346  GLUCAP 130* 140* 147*    Assessment/Plan: S/P Procedure(s) (LRB): MINIMALLY INVASIVE AORTIC VALVE REPLACEMENT (AVR) [ using [employer] Valve Size 23mm (N/A) TRANSESOPHAGEAL ECHOCARDIOGRAM (TEE) (N/A)  1. CV-Afib this morning rate 150s. Converted with IV lopressor.  Now in NSR in the 80s with stable LBBB. Increase BB to 25mg  BID.  2. Pulm-tolerating room air with good oxygen saturation. CXR shows no pneumothorax s/p pleural chest tube removal.  3. Renal-Remains 5 lbs fluid overloaded. Creatinine 1.26, electrolytes okay.  Mag 2.4.  4. Endo-blood glucose level well controlled on current regimen. Continue Synthroid for Hypothyroidism 5. GI-advance diet today. No vomiting/nausea for 12 hours.  KUB negative for obstruction. Continue reglan 6. H and H stable at 9.5/29.8, platelets 221k  Plan: increase metoprolol for better rhythm and HR control. Continue to work on [employer]. Ambulate in the halls. Encouraged incentive spirometer. Possibly home Tuesday if he continued to progress. EPW out tomorrow if rhythm remains stable.      LOS: 4 days    Elgie Collard 12/21/2017  Recurrent afib , now sinus  Nausea has improved  I have seen and examined Eric [name] and agree with the above assessment  and plan.  Grace Isaac MD Beeper [phone] Office [phone] 12/21/2017 1:15 PM

## 2017-12-22 MED ORDER — POTASSIUM CHLORIDE CRYS ER 20 MEQ PO TBCR
40.0000 meq | EXTENDED_RELEASE_TABLET | Freq: Once | ORAL | Status: AC
  Administered 2017-12-22: 40 meq via ORAL
  Filled 2017-12-22: qty 2

## 2017-12-22 MED ORDER — FUROSEMIDE 10 MG/ML IJ SOLN
40.0000 mg | Freq: Once | INTRAMUSCULAR | Status: AC
  Administered 2017-12-22: 40 mg via INTRAVENOUS
  Filled 2017-12-22: qty 4

## 2017-12-22 NOTE — Progress Notes (Addendum)
Pt EPW removed per MD order. Upon first attempt unable to pull wires. Chest tube suture was removed to be able to pull wires out successfully. Wires removed successfully by Charge,RN. Pt tolerated well;VSS. Steri strips applied. Pt educated to remain on bedrest until 1:56pm.  Will continue to monitor pt.   Chauncey Reading

## 2017-12-22 NOTE — Progress Notes (Signed)
0511-0211 Pt walked earlier with daughter and I observed him walking again with daughter after our education. No rolling walker needed. Ed completed with pt and daughter who voiced understanding. Reviewed restrictions, IS, ex ed and gave heart healthy diet and recommended low sodium foods. Discussed CRP 2 and pt would like referral to [facility]. Cannot write order for Dr Bettina Gavia so will send card of interest to [facility] program. Graylon Good RN BSN 12/22/2017 11:15 AM

## 2017-12-22 NOTE — Care Management Important Message (Signed)
Important Message  Patient Details  Name: Eric [name] MRN: [id number] Date of Birth: 03-18-1946   Medicare Important Message Given:  Yes    Eric [name] P Alvo 12/22/2017, 3:15 PM

## 2017-12-22 NOTE — Progress Notes (Addendum)
      [address]       [place] [address]             [phone]     CARDIOTHORACIC SURGERY PROGRESS NOTE  5 Days Post-Op  S/P Procedure(s) (LRB): MINIMALLY INVASIVE AORTIC VALVE REPLACEMENT (AVR) [ using [employer] Valve Size 75mm (N/A) TRANSESOPHAGEAL ECHOCARDIOGRAM (TEE) (N/A)  Subjective: Looks good and reports feeling much better.  Appetite improved.  Ambulating well.  Objective: Vital signs in last 24 hours: Temp:  [98.1 F (36.7 C)-98.8 F (37.1 C)] 98.6 F (37 C) (07/15 0834) Pulse Rate:  [72-89] 84 (07/15 0834) Cardiac Rhythm: Normal sinus rhythm (07/15 0834) Resp:  [13-24] 13 (07/15 0834) BP: (106-141)/(61-74) 138/64 (07/15 0834) SpO2:  [91 %-95 %] 95 % (07/15 0834) Weight:  [193 lb 2 oz (87.6 kg)] 193 lb 2 oz (87.6 kg) (07/15 0300)  Physical Exam:  Rhythm:   sinus  Breath sounds: clear  Heart sounds:  RRR  Incisions:  Clean and dry  Abdomen:  Soft, non-distended, non-tender  Extremities:  Warm, well-perfused    Intake/Output from previous day: 07/14 0701 - 07/15 0700 In: 480 [P.O.:480] Out: 501 [Urine:500; Stool:1] Intake/Output this shift: No intake/output data recorded.  Lab Results: Recent Labs    12/20/17 0500  WBC 12.6*  HGB 9.5*  HCT 29.8*  PLT 221   BMET:  Recent Labs    12/20/17 0500  NA 138  K 3.9  CL 103  CO2 27  GLUCOSE 141*  BUN 15  CREATININE 1.26*  CALCIUM 8.1*    CBG (last 3)  No results for input(s): GLUCAP in the last 72 hours. PT/INR:  No results for input(s): LABPROT, INR in the last 72 hours.  CXR:  CHEST - 2 VIEW  COMPARISON:  12/20/2017  FINDINGS: Normal heart size. Right chest tube has been removed. No pneumothorax visualized. Areas of platelike atelectasis noted within the right midlung and right base. There is a left pleural effusion which appears unchanged from previous exam.  IMPRESSION: 1. No pneumothorax after chest tube removal. 2. Left lung platelike  atelectasis.   Electronically Signed   By: Kerby Moors M.D.   On: 12/21/2017 08:22  Assessment/Plan: S/P Procedure(s) (LRB): MINIMALLY INVASIVE AORTIC VALVE REPLACEMENT (AVR) [ using [employer] Valve Size 68mm (N/A) TRANSESOPHAGEAL ECHOCARDIOGRAM (TEE) (N/A)  Doing well Maintaining NSR - no further Afib last 24 hrs BP well controlled Ambulating well   D/C pacing wires  Mobilize  Diuresis  Continue metoprolol 25 mg bid but will not restart losartan/HCTZ unless BP increases further  D/C home tomorrow if rhythm stable  Start warfarin if post-op Afib recurrs  Rexene Alberts, MD 12/22/2017 8:46 AM

## 2017-12-23 LAB — BASIC METABOLIC PANEL
Anion gap: 8 (ref 5–15)
BUN: 12 mg/dL (ref 8–23)
CO2: 30 mmol/L (ref 22–32)
CREATININE: 1.06 mg/dL (ref 0.61–1.24)
Calcium: 8.4 mg/dL — ABNORMAL LOW (ref 8.9–10.3)
Chloride: 104 mmol/L (ref 98–111)
GFR calc non Af Amer: >60 mL/min (ref >60)
Glucose, Bld: 102 mg/dL — ABNORMAL HIGH (ref 70–99)
POTASSIUM: 3.3 mmol/L — AB (ref 3.5–5.1)
Sodium: 142 mmol/L (ref 135–145)

## 2017-12-23 LAB — CBC
HEMATOCRIT: 26 % — AB (ref 39.0–52.0)
HEMOGLOBIN: 8.3 g/dL — AB (ref 13.0–17.0)
MCH: 30.7 pg (ref 26.0–34.0)
MCHC: 31.9 g/dL (ref 30.0–36.0)
MCV: 96.3 fL (ref 78.0–100.0)
PLATELETS: 229 10*3/uL (ref 150–400)
RBC: 2.7 MIL/uL — AB (ref 4.22–5.81)
RDW: 13.5 % (ref 11.5–15.5)
WBC: 5 10*3/uL (ref 4.0–10.5)

## 2017-12-23 MED ORDER — METOPROLOL TARTRATE 50 MG PO TABS
50.0000 mg | ORAL_TABLET | Freq: Two times a day (BID) | ORAL | Status: DC
  Administered 2017-12-23: 50 mg via ORAL
  Filled 2017-12-23: qty 1

## 2017-12-23 MED ORDER — ASPIRIN 325 MG PO TBEC: 325.0000 mg | DELAYED_RELEASE_TABLET | Freq: Every day | ORAL | 0 refills | Status: DC

## 2017-12-23 MED ORDER — POTASSIUM CHLORIDE CRYS ER 20 MEQ PO TBCR
40.0000 meq | EXTENDED_RELEASE_TABLET | Freq: Once | ORAL | Status: AC
  Administered 2017-12-23: 40 meq via ORAL
  Filled 2017-12-23: qty 2

## 2017-12-23 MED ORDER — OXYCODONE HCL 5 MG PO TABS: 5.0000 mg | ORAL_TABLET | Freq: Four times a day (QID) | ORAL | 0 refills | Status: DC | PRN

## 2017-12-23 NOTE — Progress Notes (Signed)
D/c instructions given to patient and family. Prescriptions given. All questions answered. IV and central line removed. Telemetry removed. Wife to escort pt home.  Clyde Canterbury, RN

## 2017-12-23 NOTE — Care Management Note (Signed)
Case Management Note Marvetta Gibbons RN, BSN Unit 4E-Case Manager [phone]  Patient Details  Name: Eric [name] MRN: [id number] Date of Birth: [birth year]  Subjective/Objective:    Pt admitted s/p mini AVR                Action/Plan: PTA pt lived at home with spouse, plan to return home no CM needs noted for transition home.   Expected Discharge Date:  12/23/17               Expected Discharge Plan:  Home/Self Care  In-House Referral:  NA  Discharge planning Services  CM Consult  Post Acute Care Choice:  NA Choice offered to:  NA  DME Arranged:    DME Agency:     HH Arranged:    HH Agency:     Status of Service:  Completed, signed off  If discussed at [employer] of Stay Meetings, dates discussed:    Additional Comments:  Dawayne Patricia, RN 12/23/2017, 3:09 PM

## 2017-12-23 NOTE — Progress Notes (Addendum)
      [address]       ,[place] [address]             [phone]      6 Days Post-Op Procedure(s) (LRB): MINIMALLY INVASIVE AORTIC VALVE REPLACEMENT (AVR) [ using [employer]uity Valve Size 72mm (N/A) TRANSESOPHAGEAL ECHOCARDIOGRAM (TEE) (N/A) Subjective: Feels okay this morning and ready to go home.  Objective: Vital signs in last 24 hours: Temp:  [98.3 F (36.8 C)-99.2 F (37.3 C)] 98.5 F (36.9 C) (07/16 0525) Pulse Rate:  [84-98] 98 (07/16 0525) Cardiac Rhythm: Normal sinus rhythm (07/16 0100) Resp:  [13-33] 16 (07/16 0525) BP: (121-152)/(55-76) 152/76 (07/16 0525) SpO2:  [94 %-98 %] 98 % (07/16 0525) Weight:  [85.6 kg (188 lb 11.2 oz)] 85.6 kg (188 lb 11.2 oz) (07/16 0525)     Intake/Output from previous day: 07/15 0701 - 07/16 0700 In: 118 [P.O.:118] Out: 400 [Urine:400] Intake/Output this shift: No intake/output data recorded.  General appearance: alert, cooperative and no distress Heart: regular rate and rhythm, S1, S2 normal, no murmur, click, rub or gallop Lungs: clear to auscultation bilaterally Abdomen: soft, non-tender; bowel sounds normal; no masses,  no organomegaly Extremities: extremities normal, atraumatic, no cyanosis or edema Wound: clean and dry  Lab Results: Recent Labs    12/23/17 0536  WBC 5.0  HGB 8.3*  HCT 26.0*  PLT 229   BMET:  Recent Labs    12/23/17 0536  NA 142  K 3.3*  CL 104  CO2 30  GLUCOSE 102*  BUN 12  CREATININE 1.06  CALCIUM 8.4*    PT/INR: No results for input(s): LABPROT, INR in the last 72 hours. ABG    Component Value Date/Time   PHART 7.298 (L) 12/17/2017 1518   HCO3 24.6 12/17/2017 1518   TCO2 23 12/18/2017 1725   ACIDBASEDEF 2.0 12/17/2017 1518   O2SAT 96.0 12/17/2017 1518   CBG (last 3)  No results for input(s): GLUCAP in the last 72 hours.  Assessment/Plan: S/P Procedure(s) (LRB): MINIMALLY INVASIVE AORTIC VALVE REPLACEMENT (AVR) [ using [employer] Valve Size 38mm  (N/A) TRANSESOPHAGEAL ECHOCARDIOGRAM (TEE) (N/A)   1. CV-Post-op afib over the weekend. Now in NSR in the 80s with stable LBBB. Continue Metoprolol 25mg  BID. BP has been climbing a little 152/76 this morning. May need to start low-dose losartan-hydrochlorothiazide. 2. Pulm-tolerating room air with good oxygen saturation. CXR shows no pneumothorax s/p pleural chest tube removal.  3. Renal-Now down to baseline weight. Creatinine 1.06, electrolytes okay. 4. Endo-blood glucose level well controlled on current regimen. Continue Synthroid for Hypothyroidism 5. GI-tolerating most foods.  KUB negative for obstruction.Continue reglan 6. H and H stable at 8.3/26.0, platelets 229k  Plan: Home today with family. No coumadin since rhythm is stable. May need better BP control.     LOS: 6 days    Elgie Collard 12/23/2017  I have seen and examined the patient and agree with the assessment and plan as outlined.  D/C home today.  Increase metoprolol to 50 mg bid.  Continue to hold Losartan/HCTZ for now and watch BP - may need to resume soon.  Instructions given.  Rexene Alberts, MD 12/23/2017 8:17 AM

## 2017-12-25 ENCOUNTER — Telehealth: Payer: Self-pay

## 2017-12-25 NOTE — Telephone Encounter (Signed)
thx

## 2017-12-25 NOTE — Telephone Encounter (Signed)
Hospital follow up call made to patient per Dr. Kathlene November. Patient states he is doing well at this time. Would like to thank Dr. Larose Kells for referring him to Cardiology. States the referral was "Right on Time". Patient states he is just trying to rebuild his energy. Has appointments scheduled with Cardiology and Surgeon in the near future, Patient states he does not need to follow up with this office at this time but will call us when he feels he does.

## 2017-12-26 ENCOUNTER — Telehealth: Payer: Self-pay

## 2017-12-26 NOTE — Telephone Encounter (Signed)
Patient called this morning concerned about his temperature.  He is s/p Mini AVR 12/17/17 and was just discharged from the hospital.  States that it was 99.6 last night.  Checked again when he woke up 99.3, and lastly before he called the office 98.6.  Advised patient that temperature was not high enough to be of concern at this point.  He states that his incision sites look great with no signs/ symptoms of infection.  Advised patient to give [place] a call back if his temperature changes or his incision sites change.  He acknowledged receipt.

## 2017-12-27 ENCOUNTER — Encounter (HOSPITAL_COMMUNITY): Payer: Self-pay | Admitting: Emergency Medicine

## 2017-12-27 ENCOUNTER — Emergency Department (HOSPITAL_COMMUNITY)
Admission: EM | Admit: 2017-12-27 | Discharge: 2017-12-27 | Disposition: A | Discharge status: Home / Self Care | Payer: Medicare Other | Source: Home / Self Care | Attending: Emergency Medicine | Admitting: Emergency Medicine

## 2017-12-27 ENCOUNTER — Emergency Department (HOSPITAL_COMMUNITY): Payer: Medicare Other

## 2017-12-27 DIAGNOSIS — T148XXA Other injury of unspecified body region, initial encounter: Secondary | ICD-10-CM

## 2017-12-27 DIAGNOSIS — Y658 Other specified misadventures during surgical and medical care: Secondary | ICD-10-CM

## 2017-12-27 DIAGNOSIS — Z7982 Long term (current) use of aspirin: Secondary | ICD-10-CM

## 2017-12-27 DIAGNOSIS — I10 Essential (primary) hypertension: Secondary | ICD-10-CM

## 2017-12-27 DIAGNOSIS — Z79899 Other long term (current) drug therapy: Secondary | ICD-10-CM | POA: Insufficient documentation

## 2017-12-27 DIAGNOSIS — I952 Hypotension due to drugs: Secondary | ICD-10-CM | POA: Diagnosis not present

## 2017-12-27 DIAGNOSIS — E039 Hypothyroidism, unspecified: Secondary | ICD-10-CM

## 2017-12-27 DIAGNOSIS — Z954 Presence of other heart-valve replacement: Secondary | ICD-10-CM

## 2017-12-27 DIAGNOSIS — Z950 Presence of cardiac pacemaker: Secondary | ICD-10-CM | POA: Insufficient documentation

## 2017-12-27 DIAGNOSIS — J90 Pleural effusion, not elsewhere classified: Secondary | ICD-10-CM

## 2017-12-27 DIAGNOSIS — T8189XA Other complications of procedures, not elsewhere classified, initial encounter: Secondary | ICD-10-CM | POA: Insufficient documentation

## 2017-12-27 DIAGNOSIS — L24A9 Irritant contact dermatitis due friction or contact with other specified body fluids: Secondary | ICD-10-CM

## 2017-12-27 MED ORDER — FUROSEMIDE 20 MG PO TABS
40.0000 mg | ORAL_TABLET | Freq: Every day | ORAL | Status: DC
  Administered 2017-12-27: 40 mg via ORAL
  Filled 2017-12-27: qty 2

## 2017-12-27 NOTE — ED Notes (Addendum)
Chuck placed over bandage between gown and bandage to prevent weeping from soiling sheets.

## 2017-12-27 NOTE — Discharge Instructions (Signed)
The fluid draining from your incision

## 2017-12-27 NOTE — ED Provider Notes (Signed)
[facility] EMERGENCY DEPARTMENT Provider Note   CSN: [id number] Arrival date & time: 12/27/17  2025     History   Chief Complaint Chief Complaint  Patient presents with  . Post-op Problem    HPI Eric [name] is a 72 y.o. male with a medical history of HTN, hypothyroidism, and aortic stenosis s/p AV replacement via a mini thoracotomy approach on 12/17/2017 who presents for profuse drainage from his surgical site that began this morning. He reports that he was discharged from the hospital 7/17 without any post-op complications. On 7/15 his epicardial pacemaker wires were removed and steri-strips were applied to the incisions. He had scant drainage from his incisions after this procedure. He states that this morning he noticed a large volume of drainage coming from one incision in particular on his chest. He describes the fluid as a couple drops of blood diluted by a lot of water. The fluid soaked through his shirt and his sheets. Since coming to the ED, it has also soaked through an ABD. He denies any pain, redness, or pus at the incisions. He denies fevers and chills. Has some residual pain from surgery, but no worsening in his pain level since discharge. He reports following post-op wound care instructions and gently increasing physical activity as tolerated.   Past Medical History:  Diagnosis Date  . Abnormal LFTs   . Anxiety   . Aortic stenosis 04/15/2011  . Dizziness 04/21/2014  . Dyspnea   . Heart murmur   . Hyperglycemia 10/20/2014  . Hyperlipidemia   . Hypertension   . Hypothyroidism   . Macular edema    Dr  Sherlynn Stalls   . 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 Abnormal LFTs   . Macular edema   . PCP NOTES >>>>>> 04/27/2015  . Hyperglycemia 10/20/2014  . Dizziness 04/21/2014  . Annual physical exam 04/15/2011  . Aortic stenosis 04/15/2011  . Hypothyroidism 10/13/2007  . Hyperlipidemia 10/13/2007  . Hypertension 04/08/2007    Past Surgical History:  Procedure Laterality Date  . AORTIC VALVE REPLACEMENT N/A 12/17/2017   Procedure: MINIMALLY INVASIVE AORTIC VALVE REPLACEMENT (AVR) [ using Edwards Intuity Valve Size 41mm;  Surgeon: Rexene Alberts, MD;  Location: [facility];  Service: Open Heart Surgery;  Laterality: N/A;  MINI THORACOTOMY  . CARDIAC CATHETERIZATION  11/10/00  . CATARACT EXTRACTION W/ INTRAOCULAR LENS IMPLANT     RIGHT EYE  . CHOLECYSTECTOMY    . INGUINAL HERNIA REPAIR     (B)  . POLYPECTOMY    . RIGHT/LEFT HEART CATH AND CORONARY ANGIOGRAPHY N/A 10/16/2017   Procedure: RIGHT/LEFT HEART CATH AND CORONARY ANGIOGRAPHY;  Surgeon: [name], MD;  Location: [facility];  Service: Cardiovascular;  Laterality: N/A;  . TEE WITHOUT CARDIOVERSION N/A 12/17/2017   Procedure: TRANSESOPHAGEAL ECHOCARDIOGRAM (TEE);  Surgeon: Rexene Alberts, MD;  Location: [facility];  Service: Open Heart Surgery;  Laterality: N/A;  . TONSILLECTOMY          Home Medications    Prior to Admission medications   Medication Sig Start Date End Date Taking? Authorizing Provider  aspirin EC 325 MG EC tablet Take 1 tablet (325 mg total) by mouth daily. 12/23/17   Harriet Pho,  Terance Hart, PA-C  levothyroxine (SYNTHROID, LEVOTHROID) 88 MCG tablet Take 1 tablet (88 mcg total) by mouth daily before breakfast. 12/16/17   Colon Branch, MD  metoprolol tartrate (LOPRESSOR) 50 MG tablet Take 1 tablet (50 mg total) by mouth 2 (two) times daily. 12/16/17   Colon Branch, MD  oxyCODONE (OXY IR/ROXICODONE) 5 MG immediate release tablet Take 1 tablet (5 mg total) by mouth every 6 (six) hours as needed for severe pain. 12/23/17   Elgie Collard, PA-C  rosuvastatin (CRESTOR) 20  MG tablet Take 1 tablet (20 mg total) by mouth daily. 12/16/17   Colon Branch, MD    Family History Family History  Problem Relation Age of Onset  . Diabetes Mother   . Stroke Mother   . Diabetes Brother        ?  . Coronary artery disease Brother        Male 1st degree relative >50, had CABG in his 55s  . Emphysema Father   . COPD Father   . Colon cancer Neg Hx   . Prostate cancer Neg Hx   . Colon polyps Neg Hx   . Esophageal cancer Neg Hx   . Rectal cancer Neg Hx   . Stomach cancer Neg Hx     Social History Social History   Tobacco Use  . Smoking status: Former Smoker    Last attempt to quit: 04/15/1990    Years since quitting: 27.7  . Smokeless tobacco: Never Used  . Tobacco comment: used to smoke 2 ppd  Substance Use Topics  . Alcohol use: No    Comment: no ETOH since the 90s  . Drug use: No     Allergies   Atorvastatin and Ezetimibe   Review of Systems Review of Systems  Constitutional: Negative for chills and fever.  HENT: Negative for rhinorrhea and sore throat.   Respiratory: Positive for cough and shortness of breath.   Cardiovascular:       Mild post-op chest wall pain. Endorses ankle swelling.  Gastrointestinal: Negative for abdominal pain, nausea and vomiting.  Genitourinary: Negative for difficulty urinating and dysuria.  Musculoskeletal: Negative for back pain and neck stiffness.  Skin: Positive for wound. Negative for rash.  Neurological: Positive for headaches. Negative for dizziness and light-headedness.     Physical Exam Updated Vital Signs BP (!) 152/72   Pulse 91   Temp 98.1 F (36.7 C) (Oral)   Resp (!) 26   Ht 5' 8.5" (1.74 m)   Wt 85.7 kg (189 lb)   SpO2 96%   BMI 28.32 kg/m   Physical Exam  Constitutional: He is oriented to person, place, and time. He appears well-developed and well-nourished. No distress.  HENT:  Head: Normocephalic and atraumatic.  Eyes: Pupils are equal, round, and reactive to light. EOM are normal.    Neck: Normal range of motion.  Healing ecchymoses on right neck  Cardiovascular: Normal rate and regular rhythm. Exam reveals no gallop and no friction rub.  No murmur heard. Pulmonary/Chest: Effort normal and breath sounds normal. He has no wheezes. He has no rales.  Musculoskeletal: Normal range of motion.  Trace pedal edema.  Neurological: He is alert and oriented to person, place, and time.  Skin: Skin is warm and dry.  Four incisions on right chest wall. Two with steri-strips intact. All four wounds appear clean, dry, and intact without erythema, pus, induration, or fluctuance. Healing ecchymoses on chest wall.  Psychiatric: He has a normal  mood and affect. His behavior is normal.     ED Treatments / Results  Labs (all labs ordered are listed, but only abnormal results are displayed) Labs Reviewed - No data to display  EKG None  Radiology Dg Chest 2 View  Result Date: 12/27/2017 CLINICAL DATA:  Recent aortic valve replacement. Postop drainage from the right chest. EXAM: CHEST - 2 VIEW COMPARISON:  12/21/2017 FINDINGS: Sequelae of aortic valve replacement are again identified. Epicardial pacing wires have been removed. The cardiomediastinal silhouette is unchanged with persistent increased soft tissue density in the right paratracheal and right hilar regions. Underlying heart size is likely normal. Aortic atherosclerosis is noted. There is improved aeration of the lung bases with decreased atelectasis. Small pleural effusions are present, and there is some loculated fluid in the right minor fissure. No pneumothorax is identified. A plate and screws are noted at the level of the right second costal cartilage, and right upper quadrant abdominal surgical clips are noted. IMPRESSION: Postsurgical changes with improved aeration of the lung bases. Persistent small pleural effusions. Electronically Signed   By: Logan Bores M.D.   On: 12/27/2017 08:32    Procedures Procedures (including  critical care time)  Medications Ordered in ED Medications  furosemide (LASIX) tablet 40 mg (40 mg Oral Given 12/27/17 9163)     Initial Impression / Assessment and Plan / ED Course  I have reviewed the triage vital signs and the nursing notes.  Pertinent labs & imaging results that were available during my care of the patient were reviewed by me and considered in my medical decision making (see chart for details).  Eric [name] is a 72 y.o. male with a medical history of HTN, hypothyroidism, and aortic stenosis s/p AV replacement via a mini thoracotomy approach on 12/17/2017 who presents for profuse drainage from a surgical incision. Upon arrival to the ED, pt is afebrile and hemodynamically stable. Physical exam shows four incisions on right chest wall that appear clean, dry, and intact without erythema, pus, induration, or fluctuance. No evidence of infection. CXR showed postsurgical changes with persistent small pleural effusions. Wound culture obtained and pending. Pt evaluated by Dr. Prescott Gum of cardiothoracic surgery who believes the fluid is coming from the pleural effusions and prescribed the patient 5 days of lasix therapy. He also provided the patient with wound care materials and f/u instructions.   Final Clinical Impressions(s) / ED Diagnoses   Final diagnoses:  Wound drainage  Pleural effusion    ED Discharge Orders    None       Dorrell, Andree Elk, MD 12/27/17 1116    Isla Pence, MD 12/27/17 [phone]

## 2017-12-27 NOTE — ED Notes (Signed)
Patient transported to X-ray 

## 2017-12-27 NOTE — Progress Notes (Signed)
  Subjective: Patient examined, chest x-ray images reviewed. Patient recently discharged after aortic valve replacement for aortic stenosis via anterior small thoracotomy.  Patient has been doing well and progressing but had sudden onset of serosanguineous drainage from chest tube sites at the lower right costal margin.  The drainage saturated his clothing, sheets. No  history of fever.  Good appetite.   Mild edema.  His blood pressures been elevated at home 155/70 he will resume his preop losartan dose.  He was not discharged home on Lasix and he will be given 40 mg daily for 5 days.  Normal sinus rhythm Objective: Vital signs in last 24 hours: Temp:  [98.1 F (36.7 C)] 98.1 F (36.7 C) (07/20 0630) Pulse Rate:  [81-102] 89 (07/20 0939) Cardiac Rhythm: Normal sinus rhythm (07/20 0702) Resp:  [18-29] 20 (07/20 0939) BP: (147-160)/(65-84) 155/76 (07/20 0939) SpO2:  [94 %-97 %] 96 % (07/20 0939) Weight:  [189 lb (85.7 kg)] 189 lb (85.7 kg) (07/20 0630)  Hemodynamic parameters for last 24 hours:    Intake/Output from previous day: No intake/output data recorded. Intake/Output this shift: No intake/output data recorded.  Normal sinus rhythm Aortic valve sounds fine chest x-ray shows minimal pleural effusion.  CXR with min R effusion change the patient's dressing.  Lab Results: No results for input(s): WBC, HGB, HCT, PLT in the last 72 hours. BMET: No results for input(s): NA, K, CL, CO2, GLUCOSE, BUN, CREATININE, CALCIUM in the last 72 hours.  PT/INR: No results for input(s): LABPROT, INR in the last 72 hours. ABG    Component Value Date/Time   PHART 7.298 (L) 12/17/2017 1518   HCO3 24.6 12/17/2017 1518   TCO2 23 12/18/2017 1725   ACIDBASEDEF 2.0 12/17/2017 1518   O2SAT 96.0 12/17/2017 1518   CBG (last 3)  No results for input(s): GLUCAP in the last 72 hours.  Assessment/Plan: S/P  I changed the patients dressing and provided him with materials for home Lasix 40 mg/day  called to his pharm resume preop losartan dose Move up his postop office appt to July 29   LOS: 0 days    Eric [name] 12/27/2017

## 2017-12-27 NOTE — ED Triage Notes (Signed)
Pt c/o serosanguinous fluid leaking from surgical site to R chest. Aortic Valve replacement 12/17/17, pt discharged 12/23/17. No complications noted, no leakage until last night. Pt arrived with tshirt and bandage saturated, ABD pad applied in triage, dressing saturated upon arrival to room. Pt denies pain, denied shob. A & O.

## 2017-12-29 LAB — AEROBIC CULTURE W GRAM STAIN (SUPERFICIAL SPECIMEN): Gram Stain: NONE SEEN

## 2017-12-30 ENCOUNTER — Encounter (HOSPITAL_COMMUNITY): Payer: Self-pay | Admitting: Emergency Medicine

## 2017-12-30 ENCOUNTER — Inpatient Hospital Stay (HOSPITAL_COMMUNITY)
Admission: EM | Admit: 2017-12-30 | Discharge: 2018-01-01 | DRG: 312 | Disposition: A | Discharge status: Home / Self Care | Payer: Medicare Other | Attending: Internal Medicine | Admitting: Internal Medicine

## 2017-12-30 ENCOUNTER — Emergency Department (HOSPITAL_COMMUNITY): Payer: Medicare Other

## 2017-12-30 ENCOUNTER — Other Ambulatory Visit: Payer: Self-pay

## 2017-12-30 DIAGNOSIS — N179 Acute kidney failure, unspecified: Secondary | ICD-10-CM | POA: Diagnosis present

## 2017-12-30 DIAGNOSIS — N182 Chronic kidney disease, stage 2 (mild): Secondary | ICD-10-CM | POA: Diagnosis present

## 2017-12-30 DIAGNOSIS — I5032 Chronic diastolic (congestive) heart failure: Secondary | ICD-10-CM | POA: Diagnosis present

## 2017-12-30 DIAGNOSIS — E039 Hypothyroidism, unspecified: Secondary | ICD-10-CM | POA: Diagnosis present

## 2017-12-30 DIAGNOSIS — Z7982 Long term (current) use of aspirin: Secondary | ICD-10-CM | POA: Diagnosis not present

## 2017-12-30 DIAGNOSIS — Y658 Other specified misadventures during surgical and medical care: Secondary | ICD-10-CM

## 2017-12-30 DIAGNOSIS — E785 Hyperlipidemia, unspecified: Secondary | ICD-10-CM | POA: Diagnosis present

## 2017-12-30 DIAGNOSIS — T8189XA Other complications of procedures, not elsewhere classified, initial encounter: Secondary | ICD-10-CM | POA: Diagnosis present

## 2017-12-30 DIAGNOSIS — R000 Tachycardia, unspecified: Secondary | ICD-10-CM

## 2017-12-30 DIAGNOSIS — I959 Hypotension, unspecified: Secondary | ICD-10-CM | POA: Diagnosis not present

## 2017-12-30 DIAGNOSIS — T501X5A Adverse effect of loop [high-ceiling] diuretics, initial encounter: Secondary | ICD-10-CM | POA: Diagnosis present

## 2017-12-30 DIAGNOSIS — E860 Dehydration: Secondary | ICD-10-CM | POA: Diagnosis present

## 2017-12-30 DIAGNOSIS — Z79890 Hormone replacement therapy (postmenopausal): Secondary | ICD-10-CM | POA: Diagnosis not present

## 2017-12-30 DIAGNOSIS — I350 Nonrheumatic aortic (valve) stenosis: Secondary | ICD-10-CM | POA: Diagnosis present

## 2017-12-30 DIAGNOSIS — I130 Hypertensive heart and chronic kidney disease with heart failure and stage 1 through stage 4 chronic kidney disease, or unspecified chronic kidney disease: Secondary | ICD-10-CM | POA: Diagnosis present

## 2017-12-30 DIAGNOSIS — Z87891 Personal history of nicotine dependence: Secondary | ICD-10-CM

## 2017-12-30 DIAGNOSIS — E861 Hypovolemia: Secondary | ICD-10-CM | POA: Diagnosis present

## 2017-12-30 DIAGNOSIS — R740 Nonspecific elevation of levels of transaminase and lactic acid dehydrogenase [LDH]: Secondary | ICD-10-CM | POA: Diagnosis not present

## 2017-12-30 DIAGNOSIS — I952 Hypotension due to drugs: Principal | ICD-10-CM | POA: Diagnosis present

## 2017-12-30 DIAGNOSIS — Z8249 Family history of ischemic heart disease and other diseases of the circulatory system: Secondary | ICD-10-CM | POA: Diagnosis not present

## 2017-12-30 DIAGNOSIS — I472 Ventricular tachycardia: Secondary | ICD-10-CM | POA: Diagnosis not present

## 2017-12-30 DIAGNOSIS — Z953 Presence of xenogenic heart valve: Secondary | ICD-10-CM

## 2017-12-30 DIAGNOSIS — D649 Anemia, unspecified: Secondary | ICD-10-CM | POA: Diagnosis present

## 2017-12-30 DIAGNOSIS — I447 Left bundle-branch block, unspecified: Secondary | ICD-10-CM | POA: Diagnosis not present

## 2017-12-30 DIAGNOSIS — A419 Sepsis, unspecified organism: Secondary | ICD-10-CM | POA: Diagnosis not present

## 2017-12-30 DIAGNOSIS — I361 Nonrheumatic tricuspid (valve) insufficiency: Secondary | ICD-10-CM | POA: Diagnosis not present

## 2017-12-30 HISTORY — DX: Left bundle-branch block, unspecified: I44.7

## 2017-12-30 HISTORY — DX: Ventricular tachycardia: I47.2

## 2017-12-30 HISTORY — DX: Tachycardia, unspecified: R00.0

## 2017-12-30 LAB — I-STAT CHEM 8, ED
BUN: 20 mg/dL (ref 8–23)
CALCIUM ION: 1.05 mmol/L — AB (ref 1.15–1.40)
CREATININE: 1.7 mg/dL — AB (ref 0.61–1.24)
Chloride: 99 mmol/L (ref 98–111)
GLUCOSE: 145 mg/dL — AB (ref 70–99)
HEMATOCRIT: 32 % — AB (ref 39.0–52.0)
Hemoglobin: 10.9 g/dL — ABNORMAL LOW (ref 13.0–17.0)
Potassium: 3.8 mmol/L (ref 3.5–5.1)
Sodium: 137 mmol/L (ref 135–145)
TCO2: 29 mmol/L (ref 22–32)

## 2017-12-30 LAB — URINALYSIS, ROUTINE W REFLEX MICROSCOPIC
BILIRUBIN URINE: NEGATIVE
GLUCOSE, UA: NEGATIVE mg/dL
HGB URINE DIPSTICK: NEGATIVE
KETONES UR: NEGATIVE mg/dL
Leukocytes, UA: NEGATIVE
Nitrite: NEGATIVE
PH: 5 (ref 5.0–8.0)
Protein, ur: NEGATIVE mg/dL
Specific Gravity, Urine: 1.009 (ref 1.005–1.030)

## 2017-12-30 LAB — COMPREHENSIVE METABOLIC PANEL
ALBUMIN: 3.1 g/dL — AB (ref 3.5–5.0)
ALK PHOS: 157 U/L — AB (ref 38–126)
ALT: 282 U/L — AB (ref 0–44)
AST: 257 U/L — AB (ref 15–41)
Anion gap: 13 (ref 5–15)
BILIRUBIN TOTAL: 0.9 mg/dL (ref 0.3–1.2)
BUN: 18 mg/dL (ref 8–23)
CALCIUM: 8.9 mg/dL (ref 8.9–10.3)
CO2: 24 mmol/L (ref 22–32)
CREATININE: 1.68 mg/dL — AB (ref 0.61–1.24)
Chloride: 101 mmol/L (ref 98–111)
GFR calc Af Amer: 46 mL/min — ABNORMAL LOW (ref >60)
GFR calc non Af Amer: 39 mL/min — ABNORMAL LOW (ref >60)
GLUCOSE: 143 mg/dL — AB (ref 70–99)
Potassium: 3.8 mmol/L (ref 3.5–5.1)
SODIUM: 138 mmol/L (ref 135–145)
Total Protein: 6.5 g/dL (ref 6.5–8.1)

## 2017-12-30 LAB — I-STAT CG4 LACTIC ACID, ED
Lactic Acid, Venous: 2.07 mmol/L (ref 0.5–1.9)
Lactic Acid, Venous: 1.14 mmol/L (ref 0.5–1.9)

## 2017-12-30 LAB — BRAIN NATRIURETIC PEPTIDE: B Natriuretic Peptide: 217.9 pg/mL — ABNORMAL HIGH (ref 0.0–100.0)

## 2017-12-30 LAB — PROTIME-INR
INR: 1.14
Prothrombin Time: 14.5 seconds (ref 11.4–15.2)

## 2017-12-30 LAB — TYPE AND SCREEN
ABO/RH(D): A POS
Antibody Screen: NEGATIVE

## 2017-12-30 LAB — CBC
HCT: 38.8 % — ABNORMAL LOW (ref 39.0–52.0)
HEMOGLOBIN: 12.1 g/dL — AB (ref 13.0–17.0)
MCH: 29.9 pg (ref 26.0–34.0)
MCHC: 31.2 g/dL (ref 30.0–36.0)
MCV: 95.8 fL (ref 78.0–100.0)
Platelets: 423 10*3/uL — ABNORMAL HIGH (ref 150–400)
RBC: 4.05 MIL/uL — ABNORMAL LOW (ref 4.22–5.81)
RDW: 13.8 % (ref 11.5–15.5)
WBC: 7.1 10*3/uL (ref 4.0–10.5)

## 2017-12-30 LAB — MRSA PCR SCREENING: MRSA by PCR: NEGATIVE

## 2017-12-30 LAB — I-STAT TROPONIN, ED: Troponin i, poc: 0 ng/mL (ref 0.00–0.08)

## 2017-12-30 MED ORDER — VANCOMYCIN HCL IN DEXTROSE 750-5 MG/150ML-% IV SOLN
750.0000 mg | Freq: Two times a day (BID) | INTRAVENOUS | Status: DC
  Administered 2017-12-30 – 2017-12-31 (×2): 750 mg via INTRAVENOUS
  Filled 2017-12-30 (×3): qty 150

## 2017-12-30 MED ORDER — METOPROLOL TARTRATE 25 MG PO TABS
25.0000 mg | ORAL_TABLET | Freq: Two times a day (BID) | ORAL | Status: DC
  Administered 2017-12-30 – 2017-12-31 (×3): 25 mg via ORAL
  Filled 2017-12-30 (×3): qty 1

## 2017-12-30 MED ORDER — HEPARIN SODIUM (PORCINE) 5000 UNIT/ML IJ SOLN: 5000.0000 [IU] | Freq: Three times a day (TID) | INTRAMUSCULAR | Status: DC

## 2017-12-30 MED ORDER — METOPROLOL TARTRATE 50 MG PO TABS: 50.0000 mg | ORAL_TABLET | Freq: Two times a day (BID) | ORAL | Status: DC

## 2017-12-30 MED ORDER — HEPARIN SODIUM (PORCINE) 5000 UNIT/ML IJ SOLN
5000.0000 [IU] | Freq: Three times a day (TID) | INTRAMUSCULAR | Status: DC
  Filled 2017-12-30: qty 1

## 2017-12-30 MED ORDER — LEVOTHYROXINE SODIUM 88 MCG PO TABS
88.0000 ug | ORAL_TABLET | Freq: Every day | ORAL | Status: DC
  Administered 2017-12-31 – 2018-01-01 (×2): 88 ug via ORAL
  Filled 2017-12-30 (×2): qty 1

## 2017-12-30 MED ORDER — SODIUM CHLORIDE 0.9 % IV BOLUS
1000.0000 mL | Freq: Once | INTRAVENOUS | Status: AC
  Administered 2017-12-30: 1000 mL via INTRAVENOUS

## 2017-12-30 MED ORDER — PIPERACILLIN-TAZOBACTAM 3.375 G IVPB 30 MIN
3.3750 g | Freq: Once | INTRAVENOUS | Status: AC
  Administered 2017-12-30: 3.375 g via INTRAVENOUS
  Filled 2017-12-30: qty 50

## 2017-12-30 MED ORDER — VANCOMYCIN HCL IN DEXTROSE 1-5 GM/200ML-% IV SOLN: 1000.0000 mg | Freq: Once | INTRAVENOUS | Status: DC

## 2017-12-30 MED ORDER — SODIUM CHLORIDE 0.9 % IV BOLUS
500.0000 mL | Freq: Once | INTRAVENOUS | Status: AC
  Administered 2017-12-30: 1000 mL via INTRAVENOUS

## 2017-12-30 MED ORDER — SODIUM CHLORIDE 0.9 % IV SOLN
INTRAVENOUS | Status: DC
  Administered 2017-12-30 – 2017-12-31 (×2): via INTRAVENOUS

## 2017-12-30 MED ORDER — PIPERACILLIN-TAZOBACTAM 3.375 G IVPB
3.3750 g | Freq: Three times a day (TID) | INTRAVENOUS | Status: DC
  Administered 2017-12-30 – 2018-01-01 (×5): 3.375 g via INTRAVENOUS
  Filled 2017-12-30 (×7): qty 50

## 2017-12-30 MED ORDER — ROSUVASTATIN CALCIUM 20 MG PO TABS
20.0000 mg | ORAL_TABLET | Freq: Every day | ORAL | Status: DC
  Administered 2017-12-31 – 2018-01-01 (×2): 20 mg via ORAL
  Filled 2017-12-30 (×2): qty 1

## 2017-12-30 MED ORDER — VANCOMYCIN HCL IN DEXTROSE 1-5 GM/200ML-% IV SOLN
1000.0000 mg | Freq: Once | INTRAVENOUS | Status: AC
  Administered 2017-12-30: 1000 mg via INTRAVENOUS
  Filled 2017-12-30: qty 200

## 2017-12-30 MED ORDER — PIPERACILLIN-TAZOBACTAM 3.375 G IVPB 30 MIN
3.3750 g | Freq: Once | INTRAVENOUS | Status: AC
  Administered 2017-12-30: 3.375 g via INTRAVENOUS

## 2017-12-30 NOTE — Progress Notes (Signed)
Pharmacy Antibiotic Note  Eric [name] is a 72 y.o. male admitted on 12/30/2017 with sepsis.   Plan: Zosyn iv 3.375 gm iv q8h Vanc 750 mg q12h Monitor renal fx cx vt prn  Height: 5' 8.5" (174 cm) Weight: 189 lb (85.7 kg) IBW/kg (Calculated) : 69.55  No data recorded.  Recent Labs  Lab 12/30/17 1250 12/30/17 1310 12/30/17 1614  WBC 7.1  --   --   CREATININE 1.68* 1.70*  --   LATICACIDVEN  --  2.07* 1.14    Estimated Creatinine Clearance: 42.8 mL/min (A) (by C-G formula based on SCr of 1.7 mg/dL (H)).    Allergies  Allergen Reactions  . Atorvastatin Other (See Comments)    REACTION: myalgia  . Ezetimibe Other (See Comments)    REACTION: myalgia   Levester Fresh, PharmD, BCPS, BCCCP Clinical Pharmacist [phone]  Please check AMION for all [facility] numbers  12/30/2017 4:42 PM

## 2017-12-30 NOTE — ED Triage Notes (Signed)
Pt to ED via [facility] from home with c/o low blood pressure and fast heart rate.  Pt was cardioverted per EMS enroute after receiving VERSED 5mg  IM- due to no IV access On arrival rhythm is sinus tach, converted enroute after cardioversion at 100 j.

## 2017-12-30 NOTE — Progress Notes (Signed)
While rounding in ED. Visited with patient to provide spiritual support.  Wife at bedside. Patient requested prayer.  Request was met. Continued support with ministry of presence, hospitality and empathetic listening.  Will follow as needed.    12/30/17 1600  Clinical Encounter Type  Visited With Patient and family together;Health care provider  Visit Type Initial;Spiritual support;ED;Trauma  Referral From Nurse  Spiritual Encounters  Spiritual Needs Prayer;Emotional  Stress Factors  Patient Stress Factors None identified  Family Stress Factors None identified  Cristopher [place], [facility], Pager [phone]

## 2017-12-30 NOTE — ED Provider Notes (Signed)
[facility] EMERGENCY DEPARTMENT Provider Note   CSN: [id number] Arrival date & time: 12/30/17  1238     History   Chief Complaint Chief Complaint  Patient presents with  . Hypotension  . SVT    HPI Eric [name] is a 72 y.o. male.  HPI Had aortic valve replacement with a bio prosthetic valve on 7/10.  Was seen in the emergency department 3 days ago for drainage from his incision sites.  Noted to have bilateral pleural effusions.  Was evaluated by cardiothoracic surgery at that time and recommended starting him on Lasix.  Patient states that today he felt lightheaded and dizzy especially with sitting or standing.  He checked his blood pressure and noted that it was low.  EMS was called.  When EMS arrived patient's blood pressure was in the 60s and 70s.  Had heart rate in the 170s with wide-complex rhythm.  Given IM Versed and electrically converted to normal sinus rhythm with 100 J.  Patient states his dizziness is improved.  He denies any chest pain or shortness of breath.  No recent fever or chills. Past Medical History:  Diagnosis Date  . Abnormal LFTs   . Anxiety   . Aortic stenosis 04/15/2011  . Dizziness 04/21/2014  . Dyspnea   . Heart murmur   . Hyperglycemia 10/20/2014  . Hyperlipidemia   . Hypertension   . Hypothyroidism   . Macular edema    Dr  Sherlynn Stalls   . 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 Abnormal LFTs   . Macular edema   . PCP NOTES >>>>>> 04/27/2015  . Hyperglycemia 10/20/2014  . Dizziness 04/21/2014  . Annual physical exam 04/15/2011  . Aortic stenosis 04/15/2011  . Hypothyroidism 10/13/2007  . Hyperlipidemia 10/13/2007  .  Hypertension 04/08/2007    Past Surgical History:  Procedure Laterality Date  . AORTIC VALVE REPLACEMENT N/A 12/17/2017   Procedure: MINIMALLY INVASIVE AORTIC VALVE REPLACEMENT (AVR) [ using Edwards Intuity Valve Size 58mm;  Surgeon: Rexene Alberts, MD;  Location: [facility];  Service: Open Heart Surgery;  Laterality: N/A;  MINI THORACOTOMY  . CARDIAC CATHETERIZATION  11/10/00  . CATARACT EXTRACTION W/ INTRAOCULAR LENS IMPLANT     RIGHT EYE  . CHOLECYSTECTOMY    . INGUINAL HERNIA REPAIR     (B)  . POLYPECTOMY    . RIGHT/LEFT HEART CATH AND CORONARY ANGIOGRAPHY N/A 10/16/2017   Procedure: RIGHT/LEFT HEART CATH AND CORONARY ANGIOGRAPHY;  Surgeon: [name], MD;  Location: [facility];  Service: Cardiovascular;  Laterality: N/A;  . TEE WITHOUT CARDIOVERSION N/A 12/17/2017   Procedure: TRANSESOPHAGEAL ECHOCARDIOGRAM (TEE);  Surgeon: Rexene Alberts, MD;  Location: [facility];  Service: Open Heart Surgery;  Laterality: N/A;  . TONSILLECTOMY          Home Medications    Prior to Admission medications   Medication Sig Start Date End Date Taking? Authorizing Provider  aspirin EC 325 MG EC tablet Take 1 tablet (325 mg total) by mouth daily. 12/23/17  Yes Conte, Tessa N, PA-C  furosemide (LASIX) 40 MG tablet Take 40 mg by mouth daily. For 5 days Started on Sunday 12/28/17 12/27/17  Yes [provider]  levothyroxine (SYNTHROID, [place])  88 MCG tablet Take 1 tablet (88 mcg total) by mouth daily before breakfast. 12/16/17  Yes Paz, Alda Berthold, MD  losartan-hydrochlorothiazide (HYZAAR) 100-12.5 MG tablet Take 1 tablet by mouth daily.   Yes [provider]  metoprolol tartrate (LOPRESSOR) 50 MG tablet Take 1 tablet (50 mg total) by mouth 2 (two) times daily. 12/16/17  Yes Paz, Alda Berthold, MD  oxyCODONE (OXY IR/ROXICODONE) 5 MG immediate release tablet Take 1 tablet (5 mg total) by mouth every 6 (six) hours as needed for severe pain. 12/23/17  Yes Conte, Tessa N, PA-C  rosuvastatin (CRESTOR)  20 MG tablet Take 1 tablet (20 mg total) by mouth daily. 12/16/17  Yes Colon Branch, MD    Family History Family History  Problem Relation Age of Onset  . Diabetes Mother   . Stroke Mother   . Diabetes Brother        ?  . Coronary artery disease Brother        Male 1st degree relative >50, had CABG in his 62s  . Emphysema Father   . COPD Father   . Colon cancer Neg Hx   . Prostate cancer Neg Hx   . Colon polyps Neg Hx   . Esophageal cancer Neg Hx   . Rectal cancer Neg Hx   . Stomach cancer Neg Hx     Social History Social History   Tobacco Use  . Smoking status: Former Smoker    Last attempt to quit: 04/15/1990    Years since quitting: 27.7  . Smokeless tobacco: Never Used  . Tobacco comment: used to smoke 2 ppd  Substance Use Topics  . Alcohol use: No    Comment: no ETOH since the 90s  . Drug use: No     Allergies   Atorvastatin and Ezetimibe   Review of Systems Review of Systems  Constitutional: Positive for fatigue. Negative for chills and fever.  HENT: Negative for trouble swallowing.   Eyes: Negative for visual disturbance.  Respiratory: Negative for cough and shortness of breath.   Cardiovascular: Negative for chest pain, palpitations and leg swelling.  Gastrointestinal: Negative for abdominal pain, diarrhea, nausea and vomiting.  Genitourinary: Positive for frequency. Negative for dysuria and flank pain.  Musculoskeletal: Negative for back pain, myalgias, neck pain and neck stiffness.  Skin: Positive for wound. Negative for rash.  Neurological: Positive for dizziness, weakness and light-headedness. Negative for numbness and headaches.  All other systems reviewed and are negative.    Physical Exam Updated Vital Signs BP 93/63   Pulse 81   Resp 19   Ht 5' 8.5" (1.74 m)   Wt 85.7 kg (189 lb)   SpO2 100%   BMI 28.32 kg/m   Physical Exam  Constitutional: He is oriented to person, place, and time. He appears well-developed and well-nourished. No  distress.  HENT:  Head: Normocephalic and atraumatic.  Mouth/Throat: Oropharynx is clear and moist.  Eyes: Pupils are equal, round, and reactive to light. EOM are normal.  Neck: Normal range of motion. Neck supple. No JVD present.  Cardiovascular: Regular rhythm.  Tachycardia  Pulmonary/Chest: Effort normal and breath sounds normal. No stridor. No respiratory distress. He has no wheezes. He has no rales. He exhibits no tenderness.  Abdominal: Soft. Bowel sounds are normal. There is no tenderness. There is no rebound and no guarding.  Musculoskeletal: Normal range of motion. He exhibits no edema or tenderness.  Distal pulses are 2+.  No lower extremity swelling, asymmetry or tenderness.  Lymphadenopathy:    He has no cervical adenopathy.  Neurological: He is alert and oriented to person, place, and time.  Moves all extremities without focal deficit.  Sensation intact.  Skin: Skin is warm and dry. Capillary refill takes less than 2 seconds. No rash noted. He is not diaphoretic. No erythema. There is pallor.  Psychiatric: He has a normal mood and affect. His behavior is normal.  Nursing note and vitals reviewed.    ED Treatments / Results  Labs (all labs ordered are listed, but only abnormal results are displayed) Labs Reviewed  CBC - Abnormal; Notable for the following components:      Result Value   RBC 4.05 (*)    Hemoglobin 12.1 (*)    HCT 38.8 (*)    Platelets 423 (*)    All other components within normal limits  COMPREHENSIVE METABOLIC PANEL - Abnormal; Notable for the following components:   Glucose, Bld 143 (*)    Creatinine, Ser 1.68 (*)    Albumin 3.1 (*)    AST 257 (*)    ALT 282 (*)    Alkaline Phosphatase 157 (*)    GFR calc non Af Amer 39 (*)    GFR calc Af Amer 46 (*)    All other components within normal limits  BRAIN NATRIURETIC PEPTIDE - Abnormal; Notable for the following components:   B Natriuretic Peptide 217.9 (*)    All other components within normal  limits  I-STAT CHEM 8, ED - Abnormal; Notable for the following components:   Creatinine, Ser 1.70 (*)    Glucose, Bld 145 (*)    Calcium, Ion 1.05 (*)    Hemoglobin 10.9 (*)    HCT 32.0 (*)    All other components within normal limits  I-STAT CG4 LACTIC ACID, ED - Abnormal; Notable for the following components:   Lactic Acid, Venous 2.07 (*)    All other components within normal limits  CULTURE, BLOOD (ROUTINE X 2)  CULTURE, BLOOD (ROUTINE X 2)  PROTIME-INR  URINALYSIS, ROUTINE W REFLEX MICROSCOPIC  I-STAT TROPONIN, ED  I-STAT CG4 LACTIC ACID, ED  TYPE AND SCREEN    EKG EKG Interpretation  Date/Time:  Tuesday December 30 2017 14:16:26 EDT Ventricular Rate:  83 PR Interval:    QRS Duration: 144 QT Interval:  443 QTC Calculation: 521 R Axis:   51 Text Interpretation:   NSR LBBB  llation Left bundle branch block Confirmed by Julianne Rice [id number]) on 12/30/2017 3:14:03 PM   Radiology Dg Chest Portable 1 View  Result Date: 12/30/2017 CLINICAL DATA:  Tachycardia and hypotension. Status post cardioversion. History of aortic valve replacement. EXAM: PORTABLE CHEST 1 VIEW COMPARISON:  PA and lateral chest x-ray of December 27, 2017 FINDINGS: The lungs are reasonably well inflated. There is a small left pleural effusion. There is persistent density adjacent to the minor fissure. The heart is top-normal in size. The pulmonary vascularity is normal. There is calcification in the wall of the aortic arch. A. plate and screw are present over the right second costo sternal junction. IMPRESSION: Improving appearance of the infiltrate or atelectasis in the right mid lung. Persistent small left pleural effusion. No CHF. Electronically Signed   By: Arissa Fagin  [place] M.D.   On: 12/30/2017 13:11    Procedures Procedures (including critical care time)  Medications Ordered in ED Medications  sodium chloride 0.9 % bolus 1,000 mL (1,000 mLs Intravenous New Bag/Given 12/30/17 1414)  vancomycin (VANCOCIN)  IVPB 1000 mg/200 mL premix (1,000  mg Intravenous New Bag/Given 12/30/17 1432)  sodium chloride 0.9 % bolus 500 mL (0 mLs Intravenous Stopped 12/30/17 1413)  piperacillin-tazobactam (ZOSYN) IVPB 3.375 g (3.375 g Intravenous New Bag/Given 12/30/17 1425)   CRITICAL CARE Performed by: Julianne Rice Total critical care time: 40 minutes Critical care time was exclusive of separately billable procedures and treating other patients. Critical care was necessary to treat or prevent imminent or life-threatening deterioration. Critical care was time spent personally by me on the following activities: development of treatment plan with patient and/or surrogate as well as nursing, discussions with consultants, evaluation of patient's response to treatment, examination of patient, obtaining history from patient or surrogate, ordering and performing treatments and interventions, ordering and review of laboratory studies, ordering and review of radiographic studies, pulse oximetry and re-evaluation of patient's condition.   Initial Impression / Assessment and Plan / ED Course  I have reviewed the triage vital signs and the nursing notes.  Pertinent labs & imaging results that were available during my care of the patient were reviewed by me and considered in my medical decision making (see chart for details).     Blood pressure has significantly improved with IV fluids.  Believe patient is likely very dehydrated given acute kidney injury and hemoconcentration.  Patient had culture drawn from pleural fluid when he was seen 3 days ago.  Grew Pseudomonas.  Wife says patient has been having low-grade fever.  Blood cultures drawn and broad coverage with antibiotics initiated.  Discussed with hospitalist will see patient in emergency department and admit.  Final Clinical Impressions(s) / ED Diagnoses   Final diagnoses:  Hypotension, unspecified hypotension type  AKI (acute kidney injury) [facility])  Dehydration    ED  Discharge Orders    None       Julianne Rice, MD 12/30/17 1515

## 2017-12-30 NOTE — Consult Note (Addendum)
[address]       [place], [address]             [phone]          CARDIOTHORACIC SURGERY CONSULTATION REPORT  PCP is Colon Branch, MD Primary Cardiologist is Shirlee More, MD  Reason for consultation:  tachycardia  HPI:  Patient is a 72 year old male well known to me with history of aortic stenosis, hypertension, hyperlipidemia, and hypothyroidism who recently underwent aortic valve replacement using a stented bovine pericardial bioprosthetic tissue valve on December 17, 2017 via right anterior mini thoracotomy for severe symptomatic aortic stenosis.  The patient's early postoperative recovery was notable for the development of left bundle branch block.  For this reason his beta-blocker was slowly resumed.  Other medications for hypertension utilized preoperatively were not resumed because his blood pressure remained relatively low.  He otherwise recovered uneventfully and was discharged home on the sixth postoperative day.  By the time he was discharged from the hospital his weight was at baseline and there was no sign of fluid overload.  He was seen in the emergency department by Dr. Prescott Gum on December 27, 2017 after a sudden onset of spontaneous drainage of serosanguineous fluid from his right chest via previous chest tube insertion site.  The patient states that he was feeling quite well prior to that time but fluid simply drained spontaneously, causing some concern.  Chest x-ray performed at that time revealed improved radiographic appearance of the lungs with minimal residual pleural effusion.  Swab cultures were obtained from the skin surrounding the chest tube incision which subsequently grew Pseudomonas aeruginosa.  The patient was instructed to restart taking losartan at that time and he was given a prescription for Lasix.  Since that time the patient has reported feeling more tired and weak.  This morning he suddenly became very weak and dizzy.  He checked his blood  pressure and it was low.  EMS was called.  On arrival the patient was notably tachycardic in a wide-complex rhythm with heart rate in 170s, blood pressure 60s and 70s.  He was cardioverted to sinus rhythm with 100 J and his dizziness immediately improved.  He was brought to the emergency department and has been admitted by the medical team.  He has not been seen by cardiology.  He denies fevers or chills.  He has had scant drainage from his chest tube insertion site since he was seen in the emergency department on July 20.  He has no pain in his chest.  He feels much better since cardioversion.  He has been made NPO and started on broad-spectrum antibiotics.  Cardiothoracic surgical consultation was requested.    Past Medical History:  Diagnosis Date  . Abnormal LFTs   . Anxiety   . Aortic stenosis 04/15/2011  . Dizziness 04/21/2014  . Dyspnea   . Heart murmur   . Hyperglycemia 10/20/2014  . Hyperlipidemia   . Hypertension   . Hypothyroidism   . Macular edema    Dr  Sherlynn Stalls   . 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 AORTIC VALVE REPLACEMENT N/A 12/17/2017   Procedure: MINIMALLY INVASIVE AORTIC VALVE REPLACEMENT (AVR) [ using Edwards Intuity Valve Size 6mm;  Surgeon: Rexene Alberts, MD;  Location: [facility]  OR;  Service: Open Heart Surgery;  Laterality: N/A;  MINI THORACOTOMY  . CARDIAC CATHETERIZATION  11/10/00  . CATARACT EXTRACTION W/ INTRAOCULAR LENS IMPLANT     RIGHT EYE  . CHOLECYSTECTOMY    . INGUINAL HERNIA REPAIR     (B)  . POLYPECTOMY    . RIGHT/LEFT HEART CATH AND CORONARY ANGIOGRAPHY N/A 10/16/2017   Procedure: RIGHT/LEFT HEART CATH AND CORONARY ANGIOGRAPHY;  Surgeon: [name], MD;  Location: [facility];  Service: Cardiovascular;  Laterality: N/A;    . TEE WITHOUT CARDIOVERSION N/A 12/17/2017   Procedure: TRANSESOPHAGEAL ECHOCARDIOGRAM (TEE);  Surgeon: Rexene Alberts, MD;  Location: [facility];  Service: Open Heart Surgery;  Laterality: N/A;  . TONSILLECTOMY      Family History  Problem Relation Age of Onset  . Diabetes Mother   . Stroke Mother   . Diabetes Brother        ?  . Coronary artery disease Brother        Male 1st degree relative >50, had CABG in his 45s  . Emphysema Father   . COPD Father   . Colon cancer Neg Hx   . Prostate cancer Neg Hx   . Colon polyps Neg Hx   . Esophageal cancer Neg Hx   . Rectal cancer Neg Hx   . Stomach cancer Neg Hx     Social History   Socioeconomic History  . Marital status: Married    Spouse name: Not on file  . Number of children: 3  . Years of education: Not on file  . Highest education level: Not on file  Occupational History  . Occupation: retired- used to work for [employer]: 05/2008  Social Needs  . Financial resource strain: Not on file  . Food insecurity:    Worry: Not on file    Inability: Not on file  . Transportation needs:    Medical: Not on file    Non-medical: Not on file  Tobacco Use  . Smoking status: Former Smoker    Last attempt to quit: 04/15/1990    Years since quitting: 27.7  . Smokeless tobacco: Never Used  . Tobacco comment: used to smoke 2 ppd  Substance and Sexual Activity  . Alcohol use: No    Comment: no ETOH since the 90s  . Drug use: No  . Sexual activity: Not on file  Lifestyle  . Physical activity:    Days per week: Not on file    Minutes per session: Not on file  . Stress: Not on file  Relationships  . Social connections:    Talks on phone: Not on file    Gets together: Not on file    Attends religious service: Not on file    Active member of club or organization: Not on file    Attends meetings of clubs or organizations: Not on file    Relationship status: Not on file  . Intimate partner violence:    Fear of current or  ex partner: Not on file    Emotionally abused: Not on file    Physically abused: Not on file    Forced sexual activity: Not on file  Other Topics Concern  . Not on file  Social History Narrative   Lives w/ wife   3 children, 7 g-kids              Prior to Admission medications   Medication Sig Start Date End Date Taking?  Authorizing Provider  aspirin EC 325 MG EC tablet Take 1 tablet (325 mg total) by mouth daily. 12/23/17  Yes Conte, Tessa N, PA-C  furosemide (LASIX) 40 MG tablet Take 40 mg by mouth daily. For 5 days Started on Sunday 12/28/17 12/27/17  Yes [provider]  levothyroxine (SYNTHROID, LEVOTHROID) 88 MCG tablet Take 1 tablet (88 mcg total) by mouth daily before breakfast. 12/16/17  Yes Paz, Alda Berthold, MD  losartan-hydrochlorothiazide (HYZAAR) 100-12.5 MG tablet Take 1 tablet by mouth daily.   Yes [provider]  metoprolol tartrate (LOPRESSOR) 50 MG tablet Take 1 tablet (50 mg total) by mouth 2 (two) times daily. 12/16/17  Yes Paz, Alda Berthold, MD  oxyCODONE (OXY IR/ROXICODONE) 5 MG immediate release tablet Take 1 tablet (5 mg total) by mouth every 6 (six) hours as needed for severe pain. 12/23/17  Yes Conte, Tessa N, PA-C  rosuvastatin (CRESTOR) 20 MG tablet Take 1 tablet (20 mg total) by mouth daily. 12/16/17  Yes Colon Branch, MD    Current Facility-Administered Medications  Medication Dose Route Frequency Provider Last Rate Last Dose  . 0.9 %  sodium chloride infusion   Intravenous Continuous Kayleen Memos, DO 75 mL/hr at 12/30/17 1803    . heparin injection 5,000 Units  5,000 Units Subcutaneous Q8H Hall, Carole N, DO      . [START ON 12/31/2017] levothyroxine (SYNTHROID, LEVOTHROID) tablet 88 mcg  88 mcg Oral QAC breakfast Hall, Carole N, DO      . piperacillin-tazobactam (ZOSYN) IVPB 3.375 g  3.375 g Intravenous Once Dunstan, DO 100 mL/hr at 12/30/17 1805 3.375 g at 12/30/17 1805  . piperacillin-tazobactam (ZOSYN) IVPB 3.375 g  3.375 g Intravenous Q8H  Wynell Balloon, RPH      . [START ON 12/31/2017] rosuvastatin (CRESTOR) tablet 20 mg  20 mg Oral Daily Hall, Carole N, DO      . vancomycin (VANCOCIN) IVPB 750 mg/150 ml premix  750 mg Intravenous Q12H Wynell Balloon, RPH 150 mL/hr at 12/30/17 1807 750 mg at 12/30/17 1807    Allergies  Allergen Reactions  . Atorvastatin Other (See Comments)    REACTION: myalgia  . Ezetimibe Other (See Comments)    REACTION: myalgia      Review of Systems:   Per HPI - remainder non-contributory     Physical Exam:   BP 107/67 (BP Location: Left Arm)   Pulse 85   Temp 97.8 F (36.6 C) (Oral)   Resp 20   Ht 5\' 9"  (1.753 m)   Wt 181 lb (82.1 kg)   SpO2 100%   BMI 26.73 kg/m   General:    well-appearing  HEENT:  Unremarkable   Neck:   no JVD, no bruits, no adenopathy   Chest:   clear to auscultation, symmetrical breath sounds, no wheezes, no rhonchi   Incisions:  Mini-thoracotomy healing nicely, very slight erythema around chest tube insertion sites with scant serous drainage noted on dressing  CV:   RRR, no murmur   Abdomen:  soft, non-tender, no masses   Extremities:  warm, well-perfused, pulses palpable, no lower extremity edema  Rectal/GU  Deferred  Neuro:   Grossly non-focal and symmetrical throughout  Skin:   Clean and dry, no rashes, no breakdown  Diagnostic Tests:  Lab Results: Recent Labs    12/30/17 1250 12/30/17 1310  WBC 7.1  --   HGB 12.1* 10.9*  HCT 38.8* 32.0*  PLT 423*  --    BMET:  Recent Labs    12/30/17 1250 12/30/17 1310  NA 138 137  K 3.8 3.8  CL 101 99  CO2 24  --   GLUCOSE 143* 145*  BUN 18 20  CREATININE 1.68* 1.70*  CALCIUM 8.9  --     CBG (last 3)  No results for input(s): GLUCAP in the last 72 hours. PT/INR:   Recent Labs    12/30/17 1250  LABPROT 14.5  INR 1.14    CXR:  PORTABLE CHEST 1 VIEW  COMPARISON:  PA and lateral chest x-ray of December 27, 2017  FINDINGS: The lungs are reasonably well inflated. There is a small  left pleural effusion. There is persistent density adjacent to the minor fissure. The heart is top-normal in size. The pulmonary vascularity is normal. There is calcification in the wall of the aortic arch. A. plate and screw are present over the right second costo sternal junction.  IMPRESSION: Improving appearance of the infiltrate or atelectasis in the right mid lung. Persistent small left pleural effusion. No CHF.   Electronically Signed   By: [name]  Martinique M.D.   On: 12/30/2017 13:11    Impression:  Patient with sudden onset wide-complex tachycardia in the setting of recent cardiac surgery and pre-existing left bundle branch block.  This seems most likely atrial tachycardia, atrial fibrillation or atrial flutter, but ventricular tachycardia remains a possibility.  Patient underwent cardioversion by EMS and has been maintaining sinus rhythm ever since.  There are absolutely no signs to suggest the presence of infection.  Acute kidney injury is likely secondary to hypotension related to recent resumption of ARB and Lasix several days ago and subsequent tachycardia.   Plan:  We will ask for cardiology consultation and get ECHO to r/o pericardial effusion.  Consider starting heparin or DOAC if no effusion. Resume beta-blocker at reduced dose and stop both losartan and Lasix.  Consider adding amiodarone.  Will get chest CT scan to rule out the presence of significant residual pleural effusion or other signs of infection.  Continue empiric antibiotics for now, but I really do not think the patient has any signs of infection and antibiotics should be stopped within 72 hours if all cultures remain negative.  I am reluctant to treat anything based upon a swab culture of skin surrounding an incision that does not have other signs of cellulitis or purulent drainage.  Will follow closely.   Valentina Gu. Roxy Manns, MD 12/30/2017 6:15 PM

## 2017-12-30 NOTE — Consult Note (Signed)
Cardiology Consultation:   Patient ID: Eric [name]; [id number]; 28-Apr-1946   Admit date: 12/30/2017 Date of Consult: 12/30/2017  Primary Care Provider: Colon Branch, MD Primary Cardiologist: Shirlee More, MD  Primary Electrophysiologist:  none   Patient Profile:   Eric [name] is a 72 y.o. male with a hx of mini thoracotomy aortic valve replacement who is being seen today for the evaluation of wide-complex tachycardia at the request of Dr. Roxy Manns.  History of Present Illness:   Eric [name] 72 year old male with recent minithoracotomy aortic valve replacement by Dr. Roxy Manns discharged on day 6 postoperative doing well until serous drainage was noted from wound site.  Came into the emergency room.  Seen by T CTS and restarted losartan and Lasix.  Began to feel increasingly weak and ended up getting evaluated by EMS where his blood pressure was 70 systolic and his heart rate was approximately 170 bpm.  He was shocked in the field.  This led to restoration of normal sinus rhythm.  Post aortic valve replacement, new left bundle branch block was noted with no other significant conduction defects.  He was placed on metoprolol postoperatively with reasonable blood pressure.  His losartan immediately postoperatively as well as hydrochlorothiazide were held.  No fevers, no leukocytosis.  There was concern originally for possible sepsis however this does not seem to be the case.  He is surrounded by his family, wife, son, grandkids.  Overall is feeling much better.  Past Medical History:  Diagnosis Date  . Abnormal LFTs   . Anxiety   . Aortic stenosis 04/15/2011  . Dizziness 04/21/2014  . Dyspnea   . Heart murmur   . Hyperglycemia 10/20/2014  . Hyperlipidemia   . Hypertension   . Hypothyroidism   . Left bundle branch block (LBBB) 12/18/2017  . Macular edema    Dr  Eric [name]   . 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 AORTIC VALVE REPLACEMENT N/A 12/17/2017   Procedure: MINIMALLY INVASIVE AORTIC VALVE REPLACEMENT (AVR) [ using Edwards Intuity Valve Size 36mm;  Surgeon: Rexene Alberts, MD;  Location: [place];  Service: Open Heart Surgery;  Laterality: N/A;  MINI THORACOTOMY  . CARDIAC CATHETERIZATION  11/10/00  . CATARACT EXTRACTION W/ INTRAOCULAR LENS IMPLANT     RIGHT EYE  . CHOLECYSTECTOMY    . INGUINAL HERNIA REPAIR     (B)  . POLYPECTOMY    . RIGHT/LEFT HEART CATH AND CORONARY ANGIOGRAPHY N/A 10/16/2017   Procedure: RIGHT/LEFT HEART CATH AND CORONARY ANGIOGRAPHY;  Surgeon: [name], MD;  Location: [place] [facility];  Service: Cardiovascular;  Laterality: N/A;  . TEE WITHOUT CARDIOVERSION N/A 12/17/2017   Procedure: TRANSESOPHAGEAL ECHOCARDIOGRAM (TEE);  Surgeon: Rexene Alberts, MD;  Location: [place];  Service: Open Heart Surgery;  Laterality: N/A;  . TONSILLECTOMY       Home Medications:  Prior to Admission medications   Medication Sig Start Date End Date Taking? Authorizing Provider  aspirin EC 325 MG EC tablet Take 1 tablet (325 mg total) by mouth daily. 12/23/17  Yes Conte, Tessa N, PA-C  furosemide (LASIX) 40 MG tablet Take 40 mg by mouth daily. For 5 days Started on Sunday 12/28/17 12/27/17  Yes [provider]  levothyroxine (SYNTHROID, LEVOTHROID) 88 MCG tablet Take 1 tablet (88 mcg total) by mouth  daily before breakfast. 12/16/17  Yes Paz, Alda Berthold, MD  losartan-hydrochlorothiazide (HYZAAR) 100-12.5 MG tablet Take 1 tablet by mouth daily.   Yes [provider]  metoprolol tartrate (LOPRESSOR) 50 MG tablet Take 1 tablet (50 mg total) by mouth 2 (two) times daily. 12/16/17  Yes Paz, Alda Berthold, MD  oxyCODONE (OXY IR/ROXICODONE) 5 MG immediate release tablet Take 1 tablet (5 mg total) by mouth every 6 (six) hours as needed for severe  pain. 12/23/17  Yes Conte, Tessa N, PA-C  rosuvastatin (CRESTOR) 20 MG tablet Take 1 tablet (20 mg total) by mouth daily. 12/16/17  Yes Colon Branch, MD    Inpatient Medications: Scheduled Meds: . [START ON 12/31/2017] levothyroxine  88 mcg Oral QAC breakfast  . metoprolol tartrate  25 mg Oral BID  . [START ON 12/31/2017] rosuvastatin  20 mg Oral Daily   Continuous Infusions: . sodium chloride 75 mL/hr at 12/30/17 1803  . piperacillin-tazobactam (ZOSYN)  IV    . vancomycin 750 mg (12/30/17 1807)   PRN Meds:   Allergies:    Allergies  Allergen Reactions  . Atorvastatin Other (See Comments)    REACTION: myalgia  . Ezetimibe Other (See Comments)    REACTION: myalgia    Social History:   Social History   Socioeconomic History  . Marital status: Married    Spouse name: Not on file  . Number of children: 3  . Years of education: Not on file  . Highest education level: Not on file  Occupational History  . Occupation: retired- used to work for [employer]: 05/2008  Social Needs  . Financial resource strain: Not on file  . Food insecurity:    Worry: Not on file    Inability: Not on file  . Transportation needs:    Medical: Not on file    Non-medical: Not on file  Tobacco Use  . Smoking status: Former Smoker    Last attempt to quit: 04/15/1990    Years since quitting: 27.7  . Smokeless tobacco: Never Used  . Tobacco comment: used to smoke 2 ppd  Substance and Sexual Activity  . Alcohol use: No    Comment: no ETOH since the 90s  . Drug use: No  . Sexual activity: Not on file  Lifestyle  . Physical activity:    Days per week: Not on file    Minutes per session: Not on file  . Stress: Not on file  Relationships  . Social connections:    Talks on phone: Not on file    Gets together: Not on file    Attends religious service: Not on file    Active member of club or organization: Not on file    Attends meetings of clubs or organizations: Not on file     Relationship status: Not on file  . Intimate partner violence:    Fear of current or ex partner: Not on file    Emotionally abused: Not on file    Physically abused: Not on file    Forced sexual activity: Not on file  Other Topics Concern  . Not on file  Social History Narrative   Lives w/ wife   3 children, 7 g-kids              Family History:    Family History  Problem Relation Age of Onset  . Diabetes Mother   . Stroke Mother   . Diabetes Brother        ?  Marland Kitchen  Coronary artery disease Brother        Male 1st degree relative >50, had CABG in his 44s  . Emphysema Father   . COPD Father   . Colon cancer Neg Hx   . Prostate cancer Neg Hx   . Colon polyps Neg Hx   . Esophageal cancer Neg Hx   . Rectal cancer Neg Hx   . Stomach cancer Neg Hx      ROS:  Please see the history of present illness.  All other ROS reviewed and negative.     Physical Exam/Data:   Vitals:   12/30/17 1625 12/30/17 1630 12/30/17 1715 12/30/17 1815  BP: 97/64 (!) 104/59 107/67 (!) 108/59  Pulse: 72 81 85 88  Resp: 17 (!) 21 20 20   Temp:   97.8 F (36.6 C)   TempSrc:   Oral   SpO2: 98% 100% 100% 99%  Weight:   181 lb (82.1 kg)   Height:   5\' 9"  (1.753 m)     Intake/Output Summary (Last 24 hours) at 12/30/2017 1906 Last data filed at 12/30/2017 1807 Gross per 24 hour  Intake 2000 ml  Output 380 ml  Net 1620 ml   Filed Weights   12/30/17 1310 12/30/17 1715  Weight: 189 lb (85.7 kg) 181 lb (82.1 kg)   Body mass index is 26.73 kg/m.  General:  Well nourished, well developed, in no acute distress HEENT: normal Lymph: no adenopathy Neck: no JVD Endocrine:  No thryomegaly Vascular: No carotid bruits; FA pulses 2+ bilaterally without bruits  Cardiac:  normal S1, S2; RRR; no murmur, thoracotomy site healing, minimal drainage Lungs:  clear to auscultation bilaterally, no wheezing, rhonchi or rales  Abd: soft, nontender, no hepatomegaly  Ext: no edema Musculoskeletal:  No deformities,  BUE and BLE strength normal and equal Skin: warm and dry  Neuro:  CNs 2-12 intact, no focal abnormalities noted Psych:  Normal affect   EKG:  The EKG was personally reviewed and demonstrates: Sinus rhythm 83 with left bundle branch block on 12/30/2017.  Similar ECG noted on 12/18/2017.  Telemetry:  Telemetry was personally reviewed and demonstrates: Interventricular conduction delay  Relevant CV Studies: Preop echocardiogram showed severe aortic stenosis ejection fraction with mild mitral regurgitation.  Laboratory Data:  Chemistry Recent Labs  Lab 12/30/17 1250 12/30/17 1310  NA 138 137  K 3.8 3.8  CL 101 99  CO2 24  --   GLUCOSE 143* 145*  BUN 18 20  CREATININE 1.68* 1.70*  CALCIUM 8.9  --   GFRNONAA 39*  --   GFRAA 46*  --   ANIONGAP 13  --     Recent Labs  Lab 12/30/17 1250  PROT 6.5  ALBUMIN 3.1*  AST 257*  ALT 282*  ALKPHOS 157*  BILITOT 0.9   Hematology Recent Labs  Lab 12/30/17 1250 12/30/17 1310  WBC 7.1  --   RBC 4.05*  --   HGB 12.1* 10.9*  HCT 38.8* 32.0*  MCV 95.8  --   MCH 29.9  --   MCHC 31.2  --   RDW 13.8  --   PLT 423*  --    Cardiac EnzymesNo results for input(s): TROPONINI in the last 168 hours.  Recent Labs  Lab 12/30/17 1305  TROPIPOC 0.00    BNP Recent Labs  Lab 12/30/17 1250  BNP 217.9*    DDimer No results for input(s): DDIMER in the last 168 hours.  Radiology/Studies:  Dg Chest 2 View  Result  Date: 12/27/2017 CLINICAL DATA:  Recent aortic valve replacement. Postop drainage from the right chest. EXAM: CHEST - 2 VIEW COMPARISON:  12/21/2017 FINDINGS: Sequelae of aortic valve replacement are again identified. Epicardial pacing wires have been removed. The cardiomediastinal silhouette is unchanged with persistent increased soft tissue density in the right paratracheal and right hilar regions. Underlying heart size is likely normal. Aortic atherosclerosis is noted. There is improved aeration of the lung bases with decreased  atelectasis. Small pleural effusions are present, and there is some loculated fluid in the right minor fissure. No pneumothorax is identified. A plate and screws are noted at the level of the right second costal cartilage, and right upper quadrant abdominal surgical clips are noted. IMPRESSION: Postsurgical changes with improved aeration of the lung bases. Persistent small pleural effusions. Electronically Signed   By: Logan Bores M.D.   On: 12/27/2017 08:32   Dg Chest Portable 1 View  Result Date: 12/30/2017 CLINICAL DATA:  Tachycardia and hypotension. Status post cardioversion. History of aortic valve replacement. EXAM: PORTABLE CHEST 1 VIEW COMPARISON:  PA and lateral chest x-ray of December 27, 2017 FINDINGS: The lungs are reasonably well inflated. There is a small left pleural effusion. There is persistent density adjacent to the minor fissure. The heart is top-normal in size. The pulmonary vascularity is normal. There is calcification in the wall of the aortic arch. A. plate and screw are present over the right second costo sternal junction. IMPRESSION: Improving appearance of the infiltrate or atelectasis in the right mid lung. Persistent small left pleural effusion. No CHF. Electronically Signed   By: [name]  Martinique M.D.   On: 12/30/2017 13:11    Assessment and Plan:   Wide-complex tachycardia - We do not have access to EMS strips but it was thought that his heart rate was 170 bpm at the time of DC cardioversion.  His son verifies this heart rate.  He does have an underlying left bundle branch block and it is certainly possible that he may have been experiencing a supraventricular tachycardia or perhaps atrial arrhythmia that was regular and mimicked ventricular tachycardia hence prompting DC cardioversion (although 170 bpm is unusual atrial arrhythmia rate).  Also, with the addition of his at home antihypertensives, losartan, diuretic, it is possible at this may have exacerbated his hypotension in the  setting of this arrhythmia.  Currently he is in sinus rhythm.  If we could prove that he was in fact in atrial flutter for instance, we would place him on full anticoagulation.  I am hesitant to do this without explicit proof.  We will also check an echocardiogram to ensure that he has proper function and no evidence of pericardial effusion also which could exacerbate hypotension.  I do not want to start anticoagulation in case there is an effusion present.  Continue to hold losartan and diuretic.  I agree with use of metoprolol at this time.  Aortic valve replacement bioprosthetic -Appears stable.    Left bundle branch block -New conduction abnormality following aortic valve surgery, known potential aberration following this surgery.  Watch for any signs of further deterioration of conduction disease.  We will follow  For questions or updates, please contact [place] Please consult [email or web address] for contact info under Cardiology/STEMI.   Signed, Candee Furbish, MD  12/30/2017 7:06 PM

## 2017-12-30 NOTE — H&P (Addendum)
History and Physical  Eric [name] HDQ:[id number] DOB: [birth year] DOA: 12/30/2017  Referring physician: Dr Lita Mains  PCP: Colon Branch, MD  Outpatient Specialists: Cardiothoracic Surgery Patient coming from: Home  Chief Complaint: Generalized weakness and hypotension  HPI: Eric [name] is a 72 y.o. male with medical history significant for severe aortic stenosis status post aortic valve replacement, hypothyroidism, hypertension, presented to ED [facility] from home via EMS with complaints of generalized weakness and hypotension.  States his current medications are being adjusted by his CTS outpatient and has been checking his blood pressure at home daily. This morning it was low in the 11H systolically felt dizzy.  Denies any chest pain. Reports drainage from his right chest wall sutures.  When EMS arrived heart rate was in the 170s with wide-complex tachycardia and blood pressure in the 70s. Was cardioverted successfully to sinus rhythm.  ED Course: Upon presentation to the ED patient appears dry with hypotension systolic blood pressure less than 90, heart rate greater than 110 and respiratory rate greater than 30. IV fluid boluses given.  Body fluid from chest wall done on 12/17/17 grew Pseudomonas.  Patient started on IV antibiotics empirically for suspected sepsis.  Review of Systems: Review of systems as noted in the HPI. All other systems reviewed and are negative.   Past Medical History:  Diagnosis Date  . Abnormal LFTs   . Anxiety   . Aortic stenosis 04/15/2011  . Dizziness 04/21/2014  . Dyspnea   . Heart murmur   . Hyperglycemia 10/20/2014  . Hyperlipidemia   . Hypertension   . Hypothyroidism   . Macular edema    Dr  Eric [name]   . 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 AORTIC VALVE REPLACEMENT N/A 12/17/2017   Procedure: MINIMALLY INVASIVE AORTIC VALVE REPLACEMENT (AVR) [ using Edwards Intuity Valve Size 79mm;  Surgeon: Eric Alberts, MD;  Location: [place];  Service: Open Heart Surgery;  Laterality: N/A;  MINI THORACOTOMY  . CARDIAC CATHETERIZATION  11/10/00  . CATARACT EXTRACTION W/ INTRAOCULAR LENS IMPLANT     RIGHT EYE  . CHOLECYSTECTOMY    . INGUINAL HERNIA REPAIR     (B)  . POLYPECTOMY    . RIGHT/LEFT HEART CATH AND CORONARY ANGIOGRAPHY N/A 10/16/2017   Procedure: RIGHT/LEFT HEART CATH AND CORONARY ANGIOGRAPHY;  Surgeon: [name], MD;  Location: [place] [facility];  Service: Cardiovascular;  Laterality: N/A;  . TEE WITHOUT CARDIOVERSION N/A 12/17/2017   Procedure: TRANSESOPHAGEAL ECHOCARDIOGRAM (TEE);  Surgeon: Eric Alberts, MD;  Location: [place];  Service: Open Heart Surgery;  Laterality: N/A;  . TONSILLECTOMY      Social History:  reports that he quit smoking about 27 years ago. He has never used smokeless tobacco. He reports that he does not drink alcohol or use drugs.   Allergies  Allergen Reactions  . Atorvastatin Other (See Comments)    REACTION: myalgia  . Ezetimibe Other (See Comments)    REACTION: myalgia    Family History  Problem Relation Age of Onset  . Diabetes Mother   . Stroke Mother   . Diabetes Brother        ?  . Coronary artery disease Brother        Male 1st degree relative >50, had CABG in his 15s  . Emphysema Father   .  COPD Father   . Colon cancer Neg Hx   . Prostate cancer Neg Hx   . Colon polyps Neg Hx   . Esophageal cancer Neg Hx   . Rectal cancer Neg Hx   . Stomach cancer Neg Hx      Prior to Admission medications   Medication Sig Start Date End Date Taking? Authorizing Provider  aspirin EC 325 MG EC tablet Take 1 tablet (325 mg total) by mouth daily. 12/23/17  Yes Conte, Tessa N, PA-C  furosemide (LASIX) 40 MG tablet Take 40 mg by mouth daily. For 5 days Started on Sunday 12/28/17 12/27/17   Yes [provider]  levothyroxine (SYNTHROID, LEVOTHROID) 88 MCG tablet Take 1 tablet (88 mcg total) by mouth daily before breakfast. 12/16/17  Yes Paz, Alda Berthold, MD  losartan-hydrochlorothiazide (HYZAAR) 100-12.5 MG tablet Take 1 tablet by mouth daily.   Yes [provider]  metoprolol tartrate (LOPRESSOR) 50 MG tablet Take 1 tablet (50 mg total) by mouth 2 (two) times daily. 12/16/17  Yes Paz, Alda Berthold, MD  oxyCODONE (OXY IR/ROXICODONE) 5 MG immediate release tablet Take 1 tablet (5 mg total) by mouth every 6 (six) hours as needed for severe pain. 12/23/17  Yes Conte, Tessa N, PA-C  rosuvastatin (CRESTOR) 20 MG tablet Take 1 tablet (20 mg total) by mouth daily. 12/16/17  Yes Colon Branch, MD    Physical Exam: BP 102/62   Pulse 85   Resp (!) 25   Ht 5' 8.5" (1.74 m)   Wt 85.7 kg (189 lb)   SpO2 100%   BMI 28.32 kg/m   . General: 72 y.o. year-old male well developed well nourished in no acute distress.  Alert and oriented x3. . Cardiovascular: Regular rate and rhythm with no rubs or gallops.  No thyromegaly or JVD noted.  No lower extremity edema. 2/4 pulses in all 4 extremities. Chest wall scars post cardiothoracic surgery. Marland Kitchen Respiratory: Clear to auscultation with no wheezes or rales. Good inspiratory effort. . Abdomen: Soft nontender nondistended with normal bowel sounds x4 quadrants. . Muskuloskeletal: No cyanosis, clubbing or edema noted bilaterally . Neuro: CN II-XII intact, strength, sensation, reflexes . Skin: No ulcerative lesions noted. Scars noted on right side of chest wall with mild serosanguinous drainage. Marland Kitchen Psychiatry: Judgement and insight appear normal. Mood is appropriate for condition and setting          Labs on Admission:  Basic Metabolic Panel: Recent Labs  Lab 12/30/17 1250 12/30/17 1310  NA 138 137  K 3.8 3.8  CL 101 99  CO2 24  --   GLUCOSE 143* 145*  BUN 18 20  CREATININE 1.68* 1.70*  CALCIUM 8.9  --    Liver Function Tests: Recent Labs    Lab 12/30/17 1250  AST 257*  ALT 282*  ALKPHOS 157*  BILITOT 0.9  PROT 6.5  ALBUMIN 3.1*   No results for input(s): LIPASE, AMYLASE in the last 168 hours. No results for input(s): AMMONIA in the last 168 hours. CBC: Recent Labs  Lab 12/30/17 1250 12/30/17 1310  WBC 7.1  --   HGB 12.1* 10.9*  HCT 38.8* 32.0*  MCV 95.8  --   PLT 423*  --    Cardiac Enzymes: No results for input(s): CKTOTAL, CKMB, CKMBINDEX, TROPONINI in the last 168 hours.  BNP (last 3 results) Recent Labs    12/30/17 1250  BNP 217.9*    ProBNP (last 3 results) No results for input(s): PROBNP in the last  8760 hours.  CBG: No results for input(s): GLUCAP in the last 168 hours.  Radiological Exams on Admission: Dg Chest Portable 1 View  Result Date: 12/30/2017 CLINICAL DATA:  Tachycardia and hypotension. Status post cardioversion. History of aortic valve replacement. EXAM: PORTABLE CHEST 1 VIEW COMPARISON:  PA and lateral chest x-ray of December 27, 2017 FINDINGS: The lungs are reasonably well inflated. There is a small left pleural effusion. There is persistent density adjacent to the minor fissure. The heart is top-normal in size. The pulmonary vascularity is normal. There is calcification in the wall of the aortic arch. A. plate and screw are present over the right second costo sternal junction. IMPRESSION: Improving appearance of the infiltrate or atelectasis in the right mid lung. Persistent small left pleural effusion. No CHF. Electronically Signed   By: [name]  Martinique M.D.   On: 12/30/2017 13:11    EKG: I independently viewed the EKG done and my findings are as followed:  SR 87, prolonged QTC 521 and left bundle branch block.  Assessment/Plan Present on Admission: . Sepsis ([place])  Active Problems:   Sepsis ([place])  Sepsis with unclear etiology Recent cardiothoracic surgery with chest wall body fluid growing Pseudomonas IV Zosyn started empirically; IV vancomycin also started until MRSA screen is  negative then will DC vancomycin Maintain map greater than 65 IV fluid hydration Monitor vital signs closely Blood cultures x 2 in process Obtain CBC in the am  Generalized weakness/hypotension Suspect 2/2 to sepsis and dehydration Obtain ortosthatic vital signs PT/OT to assess Fall precautions  Status post aortic valve replacement Recent surgery Cardiothoracic surgery consulted by ED physician  Recent wide-complex tachycardia status post cardioversion en route via EMS CTS consulted by ED physician due to recent procedure Continue to monitor closely on telemetry  QTC prolongation Twelve-lead EKG done on 12/30/2017 independently reviewed revealed sinus rhythm rate of 87 with left bundle branch block and QTC 521 Avoid QTC prolonging agents Repeat 12 lead EKG in the am  Small left pleural effusion Independently reviewed CXR done on 12/30/17 which revealed clear lungs with smalll left pleural effusion.  AKI on CKD 2 Suspect prerenal from dehydration Baseline creatinine 1.0 with GFR greater than 60 Creatinine on presentation 1.70 Avoid nephrotoxic agents/hypotension/dehydration Continue IV fluid hydration Repeat BMP in the morning  Hypothyroidism Continue levothyroxine  Hypertension Hypotension at this time Hold all antihypertensive medications  Chronic diastolic CHF Last 2D echo done on 12/17/2017 revealed LVEF 60 to 65% with normal wall motion  Risk: High risk for decompensation due to severe sepsis of unknown etiology, wide-complex tachycardia status post cardioversion, multiple comorbidities and advanced age.   DVT prophylaxis: Subcu heparin 3 times daily   Code Status: Full code  Family Communication: Wife at bedside.  All questions answered to her satisfaction.  Disposition Plan: Stepdown unit  Consults called: Cardiothoracic surgery consulted by ED physician  Admission status: Inpatient status  Patient will require at least 2 midnights for further testing  and treatment of present condition.    Kayleen Memos MD [facility] Pager [phone]  If 7PM-7AM, please contact night-coverage [email or web address] Password [facility]  12/30/2017, 4:02 PM

## 2017-12-31 ENCOUNTER — Inpatient Hospital Stay (HOSPITAL_COMMUNITY): Payer: Medicare Other

## 2017-12-31 ENCOUNTER — Ambulatory Visit: Payer: Medicare Other | Admitting: Cardiology

## 2017-12-31 DIAGNOSIS — A419 Sepsis, unspecified organism: Secondary | ICD-10-CM

## 2017-12-31 DIAGNOSIS — I472 Ventricular tachycardia: Secondary | ICD-10-CM

## 2017-12-31 DIAGNOSIS — I361 Nonrheumatic tricuspid (valve) insufficiency: Secondary | ICD-10-CM

## 2017-12-31 DIAGNOSIS — R740 Nonspecific elevation of levels of transaminase and lactic acid dehydrogenase [LDH]: Secondary | ICD-10-CM

## 2017-12-31 DIAGNOSIS — N179 Acute kidney failure, unspecified: Secondary | ICD-10-CM

## 2017-12-31 DIAGNOSIS — I959 Hypotension, unspecified: Secondary | ICD-10-CM

## 2017-12-31 LAB — BASIC METABOLIC PANEL
Anion gap: 8 (ref 5–15)
BUN: 17 mg/dL (ref 8–23)
CHLORIDE: 103 mmol/L (ref 98–111)
CO2: 28 mmol/L (ref 22–32)
CREATININE: 1.55 mg/dL — AB (ref 0.61–1.24)
Calcium: 8.2 mg/dL — ABNORMAL LOW (ref 8.9–10.3)
GFR calc Af Amer: 50 mL/min — ABNORMAL LOW (ref >60)
GFR calc non Af Amer: 43 mL/min — ABNORMAL LOW (ref >60)
Glucose, Bld: 110 mg/dL — ABNORMAL HIGH (ref 70–99)
Potassium: 4.1 mmol/L (ref 3.5–5.1)
Sodium: 139 mmol/L (ref 135–145)

## 2017-12-31 LAB — CBC
HEMATOCRIT: 29.1 % — AB (ref 39.0–52.0)
HEMOGLOBIN: 9 g/dL — AB (ref 13.0–17.0)
MCH: 30.1 pg (ref 26.0–34.0)
MCHC: 30.9 g/dL (ref 30.0–36.0)
MCV: 97.3 fL (ref 78.0–100.0)
Platelets: 360 10*3/uL (ref 150–400)
RBC: 2.99 MIL/uL — ABNORMAL LOW (ref 4.22–5.81)
RDW: 13.7 % (ref 11.5–15.5)
WBC: 7.7 10*3/uL (ref 4.0–10.5)

## 2017-12-31 LAB — MAGNESIUM: Magnesium: 2.3 mg/dL (ref 1.7–2.4)

## 2017-12-31 LAB — ECHOCARDIOGRAM COMPLETE
Height: 69 in
Weight: 2895.96 oz

## 2017-12-31 NOTE — Progress Notes (Signed)
[facility] PROGRESS NOTE  Eric [name] YTK:[id number] DOB: 11/19/1945 DOA: 12/30/2017  PCP: Colon Branch, MD  Brief History/Interval Summary: 72 year old Caucasian male with a past medical history of severe aortic stenosis status post aortic valve replacement earlier this year, hypothyroidism, hypertension presented with generalized weakness and hypotension.  He apparently was started on his Lasix recently and his outpatient medications were adjusted recently.  Patient also had some drainage from his right chest wall sutures.  Some of the cultures positive for Pseudomonas.  However patient denied any fever.  While EMS was transporting him they found that his heart rate was in the 170s.  They noted wide-complex tachycardia.  He was apparently cardioverted and converted to sinus rhythm.  Cardiology and cardiothoracic surgery consulted.  Reason for Visit: Hypotension likely induced by medications.  Tachyarrhythmia  Consultants: Cardiology.  Cardiothoracic surgery  Procedures:  Transthoracic echocardiogram Study Conclusions  - Left ventricle: Abnormal septal motion Systolic function was   normal. The estimated ejection fraction was in the range of 50%   to 55%. - Aortic valve: Intuity Elite pericardial tissue valve. Normal   appearing prosthetic AVR with no peri valvular regurgitation   Valve area (VTI): 1.4 cm^2. Valve area (Vmean): 1.55 cm^2. - Mitral valve: Calcified annulus. Mildly thickened leaflets . - Left atrium: The atrium was mildly dilated. - Right atrium: The atrium was mildly dilated. - Atrial septum: No defect or patent foramen ovale was identified. - Pulmonic valve: Peak gradient (S): 11 mm Hg  Antibiotics: Patient started on vancomycin and Zosyn at admission  Subjective/Interval History: Patient states that he feels much better this morning.  He ambulated with physical therapy without any problems.  Denies any chest pain shortness of breath.  Oozing from his  chest wall wounds has significantly improved.  ROS: Denies any nausea vomiting.  Objective:  Vital Signs  Vitals:   12/31/17 0318 12/31/17 0733 12/31/17 0900 12/31/17 1134  BP: 123/64 (!) 114/54 117/62 113/65  Pulse: 80 78 93 85  Resp: 18 (!) 23  (!) 27  Temp: 97.9 F (36.6 C) 97.6 F (36.4 C)  97.8 F (36.6 C)  TempSrc: Oral Oral  Oral  SpO2: 97% 98%  99%  Weight:      Height:        Intake/Output Summary (Last 24 hours) at 12/31/2017 1201 Last data filed at 12/31/2017 1100 Gross per 24 hour  Intake 2050 ml  Output 1680 ml  Net 370 ml   Filed Weights   12/30/17 1310 12/30/17 1715  Weight: 85.7 kg (189 lb) 82.1 kg (181 lb)    General appearance: alert, cooperative, appears stated age and no distress Head: Normocephalic, without obvious abnormality, atraumatic Resp: clear to auscultation bilaterally Cardio: regular rate and rhythm, S1, S2 normal, no murmur, click, rub or gallop GI: soft, non-tender; bowel sounds normal; no masses,  no organomegaly Extremities: extremities normal, atraumatic, no cyanosis or edema Pulses: 2+ and symmetric Skin: Wounds over the right chest wall anteriorly without any active oozing.  No surrounding erythema.  No warmth to touch. Neurologic: No focal neurological deficits.  Lab Results:  Data Reviewed: I have personally reviewed following labs and imaging studies  CBC: Recent Labs  Lab 12/30/17 1250 12/30/17 1310 12/31/17 0243  WBC 7.1  --  7.7  HGB 12.1* 10.9* 9.0*  HCT 38.8* 32.0* 29.1*  MCV 95.8  --  97.3  PLT 423*  --  557    Basic Metabolic Panel: Recent Labs  Lab 12/30/17 1250  12/30/17 1310 12/31/17 0243  NA 138 137 139  K 3.8 3.8 4.1  CL 101 99 103  CO2 24  --  28  GLUCOSE 143* 145* 110*  BUN 18 20 17   CREATININE 1.68* 1.70* 1.55*  CALCIUM 8.9  --  8.2*  MG  --   --  2.3    GFR: Estimated Creatinine Clearance: 43.7 mL/min (A) (by C-G formula based on SCr of 1.55 mg/dL (H)).  Liver Function  Tests: Recent Labs  Lab 12/30/17 1250  AST 257*  ALT 282*  ALKPHOS 157*  BILITOT 0.9  PROT 6.5  ALBUMIN 3.1*    Coagulation Profile: Recent Labs  Lab 12/30/17 1250  INR 1.14     Recent Results (from the past 240 hour(s))  Aerobic Culture (superficial specimen)     Status: None   Collection Time: 12/27/17  9:38 AM  Result Value Ref Range Status   Specimen Description CHEST  Final   Special Requests RIGHT  Final   Gram Stain   Final    NO WBC SEEN NO ORGANISMS SEEN Performed at [facility], [place] [address] [place], [place] [address]    Culture FEW PSEUDOMONAS AERUGINOSA  Final   Report Status 12/29/2017 FINAL  Final   Organism ID, Bacteria PSEUDOMONAS AERUGINOSA  Final      Susceptibility   Pseudomonas aeruginosa - MIC*    CEFTAZIDIME <=1 SENSITIVE Sensitive     CIPROFLOXACIN <=0.25 SENSITIVE Sensitive     GENTAMICIN <=1 SENSITIVE Sensitive     IMIPENEM 2 SENSITIVE Sensitive     PIP/TAZO <=4 SENSITIVE Sensitive     CEFEPIME <=1 SENSITIVE Sensitive     * FEW PSEUDOMONAS AERUGINOSA  MRSA PCR Screening     Status: None   Collection Time: 12/30/17  5:14 PM  Result Value Ref Range Status   MRSA by PCR NEGATIVE NEGATIVE Final    Comment:        The GeneXpert MRSA Assay (FDA approved for NASAL specimens only), is one component of a comprehensive MRSA colonization surveillance program. It is not intended to diagnose MRSA infection nor to guide or monitor treatment for MRSA infections. Performed at [facility], [place] [address] [place], [place] [address]       Radiology Studies: Ct Chest Wo Contrast  Result Date: 12/31/2017 CLINICAL DATA:  Possible pleural effusion EXAM: CT CHEST WITHOUT CONTRAST TECHNIQUE: Multidetector CT imaging of the chest was performed following the standard protocol without IV contrast. COMPARISON:  Radiograph 12/30/2017, CT 11/10/2017 FINDINGS: Cardiovascular: Limited evaluation without intravenous contrast. Nonaneurysmal  aorta. Moderate aortic atherosclerosis. Coronary vascular calcification. Aortic valve replacement. Borderline cardiomegaly. Trace pericardial effusion. Mediastinum/Nodes: Midline trachea. No thyroid mass. Soft tissue stranding within the right anterior mediastinum, likely due to recent operative status. Mildly prominent right paratracheal lymph node measuring up to 1 cm. Low right paratracheal lymph node measures 8 mm. Esophagus within normal limits. Small foci of gas within the right cardio phrenic fat with linear gas tracking to the right pleural surface anteriorly and into the subcutaneous soft tissues of the anterior chest wall. Lungs/Pleura: Small bilateral pleural effusions. Pleural effusion on the left appears slightly dense. No significant pneumothorax. Mild apical scarring. Small amount of loculated fluid along the right pulmonary fissure. Upper Abdomen: Surgical clips at the gallbladder fossa. No acute abnormality. Musculoskeletal: Status post plate and screw fixation across the right anterior second rib cartilage and sternum. Adjacent soft tissue thickening consistent with operative change. IMPRESSION: 1. Small bilateral  pleural effusions. Small amount of loculated pleural effusion along the right pulmonary fissure. Slight increased density in left pleural fluid possibly due to small amount of hemorrhagic material. 2. Interval postsurgical changes of the mediastinum. Linear gas collection extending from the right cardio phrenic fat to the soft tissues of the right anterior chest wall, likely related to previous chest tube placement. No significant right pneumothorax. Aortic Atherosclerosis (ICD10-I70.0). Electronically Signed   By: Donavan Foil M.D.   On: 12/31/2017 01:28   Dg Chest Portable 1 View  Result Date: 12/30/2017 CLINICAL DATA:  Tachycardia and hypotension. Status post cardioversion. History of aortic valve replacement. EXAM: PORTABLE CHEST 1 VIEW COMPARISON:  PA and lateral chest x-ray of  December 27, 2017 FINDINGS: The lungs are reasonably well inflated. There is a small left pleural effusion. There is persistent density adjacent to the minor fissure. The heart is top-normal in size. The pulmonary vascularity is normal. There is calcification in the wall of the aortic arch. A. plate and screw are present over the right second costo sternal junction. IMPRESSION: Improving appearance of the infiltrate or atelectasis in the right mid lung. Persistent small left pleural effusion. No CHF. Electronically Signed   By: [name]  Martinique M.D.   On: 12/30/2017 13:11     Medications:  Scheduled: . heparin injection (subcutaneous)  5,000 Units Subcutaneous Q8H  . levothyroxine  88 mcg Oral QAC breakfast  . metoprolol tartrate  25 mg Oral BID  . rosuvastatin  20 mg Oral Daily   Continuous: . sodium chloride 75 mL/hr at 12/30/17 1924  . piperacillin-tazobactam (ZOSYN)  IV 3.375 g (12/31/17 0631)  . vancomycin 750 mg (12/31/17 0603)   PRN:  Assessment/Plan:    Concern for sepsis Patient presented with generalized weakness and hypotension.  Considering recent positive wound cultures there was concern that this could be sepsis.  However patient does not have any erythema around his wounds.  These wounds do not look actively infected.  It is quite possible his hypotension was medication induced.  Patient was started on broad-spectrum antibiotics.  His MRSA PCR is negative.  We will stop the vancomycin.  Continue Zosyn until results of blood cultures are available.  Hypotension Most likely secondary to medications.  Patient was started on furosemide recently.  He was also started back on his ARB recently.  His creatinine is noted to be higher than his baseline so there might be an element of dehydration as well.  Blood pressures have improved.  Inciting agents have been held.  Status post aortic valve replacement This was done recently by Dr. Ricard Dillon with cardiothoracic surgery.  They are  following.  Wide-complex tachycardia status post cardioversion Unfortunately rhythm strips not available.  Cardiology is following.  Echocardiogram is as above.  Patient is currently on beta-blocker.  No further recurrence.  QT prolongation Monitor.  Left pleural effusion Stable compared to previous films.  Management per cardiothoracic surgery.  Acute kidney injury on chronic kidney disease stage II Baseline creatinine around 1.  Creatinine on presentation was 1.7.  Patient recently started on furosemide.  These have been held.  Creatinine shows improvement.  Continue to monitor urine output.  Normocytic anemia Drop in hemoglobin is dilutional.  His hemoglobin at presentation was likely hemoconcentrated.  No evidence of overt bleeding.  Hypothyroidism Continue home medications.  Essential hypertension Patient was hypotensive at presentation.  Medications on hold.  Chronic diastolic CHF Seems to be stable currently.  He was actually more on the hypovolemic side at  the time of admission.  Abnormal LFTs Most likely secondary to hypotension.  Recheck tomorrow morning.   DVT Prophylaxis: Cutaneous heparin    Code Status: Full code Family Communication: Discussed with the patient Disposition Plan: Management as outlined above.    LOS: 1 day   [facility] Pager [phone] 12/31/2017, 12:01 PM  If 7PM-7AM, please contact night-coverage at [email or web address], password [facility]

## 2017-12-31 NOTE — Progress Notes (Addendum)
[address]       [place] [address]             [phone]     CARDIOTHORACIC SURGERY PROGRESS NOTE  Subjective: Reports feeling "great".  No pain.  No SOB.  No fevers.  Objective: Vital signs in last 24 hours: Temp:  [97.6 F (36.4 C)-98.7 F (37.1 C)] 97.6 F (36.4 C) (07/24 0733) Pulse Rate:  [72-91] 78 (07/24 0733) Cardiac Rhythm: Normal sinus rhythm;Bundle branch block (07/24 0702) Resp:  [14-34] 23 (07/24 0733) BP: (81-123)/(51-67) 114/54 (07/24 0733) SpO2:  [95 %-100 %] 98 % (07/24 0733) Weight:  [181 lb (82.1 kg)-189 lb (85.7 kg)] 181 lb (82.1 kg) (07/23 1715)  Physical Exam:  Rhythm:   sinus  Breath sounds: clear  Heart sounds:  RRR  Incisions:  Clean and dry, healing nicely.  No drainage from chest tube incision overnight.  No significant surrounding cellulitis nor other signs of infection.  Abdomen:  Soft, non-distended, non-tender  Extremities:  Warm, well-perfused    Intake/Output from previous day: 07/23 0701 - 07/24 0700 In: 2050 [I.V.:1500; IV Piggyback:550] Out: 27 [Urine:830] Intake/Output this shift: Total I/O In: -  Out: 250 [Urine:250]  Lab Results: Recent Labs    12/30/17 1250 12/30/17 1310 12/31/17 0243  WBC 7.1  --  7.7  HGB 12.1* 10.9* 9.0*  HCT 38.8* 32.0* 29.1*  PLT 423*  --  360   BMET:  Recent Labs    12/30/17 1250 12/30/17 1310 12/31/17 0243  NA 138 137 139  K 3.8 3.8 4.1  CL 101 99 103  CO2 24  --  28  GLUCOSE 143* 145* 110*  BUN 18 20 17   CREATININE 1.68* 1.70* 1.55*  CALCIUM 8.9  --  8.2*    CBG (last 3)  No results for input(s): GLUCAP in the last 72 hours. PT/INR:   Recent Labs    12/30/17 1250  LABPROT 14.5  INR 1.14    CXR:  CT CHEST WITHOUT CONTRAST  TECHNIQUE: Multidetector CT imaging of the chest was performed following the standard protocol without IV contrast.  COMPARISON:  Radiograph 12/30/2017, CT 11/10/2017  FINDINGS: Cardiovascular: Limited evaluation  without intravenous contrast. Nonaneurysmal aorta. Moderate aortic atherosclerosis. Coronary vascular calcification. Aortic valve replacement. Borderline cardiomegaly. Trace pericardial effusion.  Mediastinum/Nodes: Midline trachea. No thyroid mass. Soft tissue stranding within the right anterior mediastinum, likely due to recent operative status. Mildly prominent right paratracheal lymph node measuring up to 1 cm. Low right paratracheal lymph node measures 8 mm. Esophagus within normal limits. Small foci of gas within the right cardio phrenic fat with linear gas tracking to the right pleural surface anteriorly and into the subcutaneous soft tissues of the anterior chest wall.  Lungs/Pleura: Small bilateral pleural effusions. Pleural effusion on the left appears slightly dense. No significant pneumothorax. Mild apical scarring. Small amount of loculated fluid along the right pulmonary fissure.  Upper Abdomen: Surgical clips at the gallbladder fossa. No acute abnormality.  Musculoskeletal: Status post plate and screw fixation across the right anterior second rib cartilage and sternum. Adjacent soft tissue thickening consistent with operative change.  IMPRESSION: 1. Small bilateral pleural effusions. Small amount of loculated pleural effusion along the right pulmonary fissure. Slight increased density in left pleural fluid possibly due to small amount of hemorrhagic material. 2. Interval postsurgical changes of the mediastinum. Linear gas collection extending from the right cardio phrenic fat to the soft tissues of the right anterior chest  wall, likely related to previous chest tube placement. No significant right pneumothorax.  Aortic Atherosclerosis (ICD10-I70.0).   Electronically Signed   By: Donavan Foil M.D.   On: 12/31/2017 01:28  Assessment/Plan:  Await f/u echocardiogram.  Agree w/ plan outlined by Dr Marlou Porch.  No heparin nor other anticoagulation until ECHO  done and reviewed.  Mobilize.  Rexene Alberts, MD 12/31/2017 8:12 AM

## 2017-12-31 NOTE — Care Management Note (Signed)
Case Management Note  Patient Details  Name: Eric [name] MRN: [id number] Date of Birth: 12/05/1945  Subjective/Objective:                 Patient admitted from home w wife, wide complex tachycardia, recent TAVR, chest wall sutures oozing but not appearing infected. Per notes patient feels better today, continuing with zosyn until cultures read out. Cleared by PT OT. No home needs identified at this time.    Action/Plan:   Expected Discharge Date:                  Expected Discharge Plan:  Home/Self Care  In-House Referral:     Discharge planning Services  CM Consult  Post Acute Care Choice:    Choice offered to:     DME Arranged:    DME Agency:     HH Arranged:    HH Agency:     Status of Service:  In process, will continue to follow  If discussed at Long Length of Stay Meetings, dates discussed:    Additional Comments:  Carles Collet, RN 12/31/2017, 2:12 PM

## 2017-12-31 NOTE — Progress Notes (Signed)
  Echocardiogram 2D Echocardiogram has been performed.  Eric [name] 12/31/2017, 9:04 AM

## 2017-12-31 NOTE — Evaluation (Signed)
Occupational Therapy Evaluation Patient Details Name: Eric [name] MRN: [id number] DOB: 01-02-1946 Today's Date: 12/31/2017    History of Present Illness 72 y.o. male with medical history significant for severe aortic stenosis status post aortic valve replacement, hypothyroidism, hypertension, presented to ED [facility] from home via EMS with complaints of generalized weakness and hypotension.   Clinical Impression   PTA, pt was living with his wife and was independent. Pt currently performing ADLs and functional mobility at supervision level near baseline function. Pt denies any SOB, pain, and dizziness. VSS throughout (see general comments). Recommend dc home once medically stable per physician. All acute OT needs met and will sign off.     Follow Up Recommendations  No OT follow up;Supervision - Intermittent    Equipment Recommendations  None recommended by OT    Recommendations for Other Services PT consult     Precautions / Restrictions Precautions Precautions: None Restrictions Weight Bearing Restrictions: No      Mobility Bed Mobility Overal bed mobility: Independent                Transfers Overall transfer level: Needs assistance Equipment used: None Transfers: Sit to/from Stand Sit to Stand: Supervision         General transfer comment: Supervision for safety during first time OOB    Balance Overall balance assessment: No apparent balance deficits (not formally assessed)                                         ADL either performed or assessed with clinical judgement   ADL Overall ADL's : Needs assistance/impaired                                       General ADL Comments: Pt performing ADLs at supervision level. Pt presenting near baseline level but reports he just feels more tired that normal.       Vision Baseline Vision/History: Wears glasses Patient Visual Report: No change from baseline       Perception      Praxis      Pertinent Vitals/Pain Pain Assessment: No/denies pain     Hand Dominance Right   Extremity/Trunk Assessment Upper Extremity Assessment Upper Extremity Assessment: Overall WFL for tasks assessed   Lower Extremity Assessment Lower Extremity Assessment: Defer to PT evaluation   Cervical / Trunk Assessment Cervical / Trunk Assessment: Normal   Communication Communication Communication: No difficulties   Cognition Arousal/Alertness: Awake/alert Behavior During Therapy: WFL for tasks assessed/performed Overall Cognitive Status: Within Functional Limits for tasks assessed                                 General Comments: Very pleasant and motivated to participate in therapy   General Comments  BP 133/69 after activity. SpO2 97 O2 on RA. HR 94-85. RR 20-30s    Exercises     Shoulder Instructions      Home Living Family/patient expects to be discharged to:: Private residence Living Arrangements: Spouse/significant other Available Help at Discharge: Family;Available 24 hours/day Type of Home: House Home Access: Level entry     Home Layout: One level     Bathroom Shower/Tub: Tub/shower unit;Curtain   Biochemist, clinical: Standard     Home Equipment:  Walker - 2 wheels;Bedside commode;Shower seat(All DME is in storage and was pt's mothers)          Prior Functioning/Environment Level of Independence: Independent        Comments: ADLs, IADLs, and driving. 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[employer] Problem List: Decreased activity tolerance      OT Treatment/Interventions:      OT Goals(Current goals can be found in the care plan section) Acute Rehab OT Goals Patient Stated Goal: "Return to home soon." OT Goal Formulation: All assessment and education complete, DC therapy  OT Frequency:     Barriers to D/C:            Co-evaluation              AM-PAC PT "6 Clicks" Daily Activity     Outcome Measure Help from  another person eating meals?: None Help from another person taking care of personal grooming?: None Help from another person toileting, which includes using toliet, bedpan, or urinal?: None Help from another person bathing (including washing, rinsing, drying)?: None Help from another person to put on and taking off regular upper body clothing?: None Help from another person to put on and taking off regular lower body clothing?: None 6 Click Score: 24   End of Session Nurse Communication: Mobility status  Activity Tolerance: Patient tolerated treatment well Patient left: in chair;with call bell/phone within reach  OT Visit Diagnosis: Muscle weakness (generalized) (M62.81)                Time: 9892-1194 OT Time Calculation (min): 20 min Charges:  OT General Charges $OT Visit: 1 Visit OT Evaluation $OT Eval Low Complexity: 1 Low G-Codes:     [place], OTR/L Acute Rehab Pager: [phone] Office: [place] 12/31/2017, 9:41 AM

## 2017-12-31 NOTE — Plan of Care (Signed)
Continue with care plan

## 2017-12-31 NOTE — Evaluation (Signed)
Physical Therapy Evaluation and Discharge Patient Details Name: Eric [name] MRN: [id number] DOB: 12-24-1945 Today's Date: 12/31/2017   History of Present Illness  72 y.o. male with medical history significant for severe aortic stenosis status post aortic valve replacement, hypothyroidism, hypertension, presented to ED [facility] from home via EMS with complaints of generalized weakness and hypotension.  Clinical Impression  Pt functioning near baseline. Pt able to tolerate ambulation outside and back with VSS and no episodes of LOB or instability. Pt safe to discharge home from mobility stand point. Pt with no further acute PT needs at this time. PT signing off. Please re-consult if needed in future.    Follow Up Recommendations No PT follow up    Equipment Recommendations  None recommended by PT    Recommendations for Other Services       Precautions / Restrictions Precautions Precautions: Other (comment) Precaution Comments: R chest incision from s/p AVR Restrictions Weight Bearing Restrictions: No      Mobility  Bed Mobility Overal bed mobility: Independent             General bed mobility comments: pt up in chair upon PT arrival  Transfers Overall transfer level: Modified independent Equipment used: None Transfers: Sit to/from Stand Sit to Stand: Modified independent (Device/Increase time)         General transfer comment: pt with good technique, no instability  Ambulation/Gait Ambulation/Gait assistance: Supervision Gait Distance (Feet): 500 Feet(x2) Assistive device: None Gait Pattern/deviations: WFL(Within Functional Limits) Gait velocity: wfl Gait velocity interpretation: >2.62 ft/sec, indicative of community ambulatory General Gait Details: no episodes of LOB or instability, VSS stable, SpO2 >95% on RA,  HR <95  Stairs            Wheelchair Mobility    Modified Rankin (Stroke Patients Only)       Balance Overall balance assessment: No  apparent balance deficits (not formally assessed)                                           Pertinent Vitals/Pain Pain Assessment: No/denies pain    Home Living Family/patient expects to be discharged to:: Private residence Living Arrangements: Spouse/significant other Available Help at Discharge: Family;Available 24 hours/day Type of Home: House Home Access: Level entry     Home Layout: One level Home Equipment: Walker - 2 wheels;Bedside commode;Shower seat(All DME is in storage and was pt's mothers)      Prior Function Level of Independence: Independent         Comments: ADLs, IADLs, and driving. 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   Dominant Hand: Right    Extremity/Trunk Assessment   Upper Extremity Assessment Upper Extremity Assessment: Overall WFL for tasks assessed    Lower Extremity Assessment Lower Extremity Assessment: Overall WFL for tasks assessed    Cervical / Trunk Assessment Cervical / Trunk Assessment: Normal  Communication   Communication: No difficulties  Cognition Arousal/Alertness: Awake/alert Behavior During Therapy: WFL for tasks assessed/performed Overall Cognitive Status: Within Functional Limits for tasks assessed                                 General Comments: Very pleasant and motivated to participate in therapy      General Comments General comments (skin integrity, edema, etc.): VSS  Exercises     Assessment/Plan    PT Assessment Patent does not need any further PT services  PT Problem List         PT Treatment Interventions      PT Goals (Current goals can be found in the Care Plan section)  Acute Rehab PT Goals Patient Stated Goal: "Return to home soon." PT Goal Formulation: All assessment and education complete, DC therapy    Frequency     Barriers to discharge        Co-evaluation               AM-PAC PT "6 Clicks" Daily Activity   Outcome Measure Difficulty turning over in bed (including adjusting bedclothes, sheets and blankets)?: None Difficulty moving from lying on back to sitting on the side of the bed? : None Difficulty sitting down on and standing up from a chair with arms (e.g., wheelchair, bedside commode, etc,.)?: None Help needed moving to and from a bed to chair (including a wheelchair)?: None Help needed walking in hospital room?: None Help needed climbing 3-5 steps with a railing? : A Little 6 Click Score: 23    End of Session Equipment Utilized During Treatment: (none) Activity Tolerance: Patient tolerated treatment well Patient left: (sitting EOB with MD) Nurse Communication: Mobility status(staff to amb outside with wife) PT Visit Diagnosis: Muscle weakness (generalized) (M62.81)    Time: 9935-7017 PT Time Calculation (min) (ACUTE ONLY): 23 min   Charges:         PT G CodesKittie Plater, PT, DPT Pager #: [phone] Office #: [phone]   B[place]ter 12/31/2017, 10:17 AM

## 2017-12-31 NOTE — Progress Notes (Signed)
Progress Note  Patient Name: Eric [name] Date of Encounter: 12/31/2017  Primary Cardiologist: Shirlee More, MD   Subjective   Feels well, no CP, no SOB  Inpatient Medications    Scheduled Meds: . heparin injection (subcutaneous)  5,000 Units Subcutaneous Q8H  . levothyroxine  88 mcg Oral QAC breakfast  . metoprolol tartrate  25 mg Oral BID  . rosuvastatin  20 mg Oral Daily   Continuous Infusions: . sodium chloride 75 mL/hr at 12/30/17 1924  . piperacillin-tazobactam (ZOSYN)  IV 3.375 g (12/30/17 2120)  . vancomycin 750 mg (12/31/17 0603)   PRN Meds:    Vital Signs    Vitals:   12/30/17 2300 12/30/17 2321 12/31/17 0300 12/31/17 0318  BP: (!) 106/56 (!) 106/56  123/64  Pulse: 88 87 81 80  Resp: (!) 27 14 20 18   Temp:  98 F (36.7 C)  97.9 F (36.6 C)  TempSrc:  Oral  Oral  SpO2: 97% 97% 98% 97%  Weight:      Height:        Intake/Output Summary (Last 24 hours) at 12/31/2017 0625 Last data filed at 12/31/2017 0300 Gross per 24 hour  Intake 2050 ml  Output 830 ml  Net 1220 ml   Filed Weights   12/30/17 1310 12/30/17 1715  Weight: 189 lb (85.7 kg) 181 lb (82.1 kg)    Telemetry    NSR, LBBB - Personally Reviewed  ECG    NSR LBBB - Personally Reviewed  Physical Exam   GEN: No acute distress.   Neck: No JVD Cardiac: RRR, no murmurs, rubs, or gallops. Dressing noted Respiratory: Clear to auscultation bilaterally. GI: Soft, nontender, non-distended  MS: No edema; No deformity. Neuro:  Nonfocal  Psych: Normal affect   Labs    Chemistry Recent Labs  Lab 12/30/17 1250 12/30/17 1310 12/31/17 0243  NA 138 137 139  K 3.8 3.8 4.1  CL 101 99 103  CO2 24  --  28  GLUCOSE 143* 145* 110*  BUN 18 20 17   CREATININE 1.68* 1.70* 1.55*  CALCIUM 8.9  --  8.2*  PROT 6.5  --   --   ALBUMIN 3.1*  --   --   AST 257*  --   --   ALT 282*  --   --   ALKPHOS 157*  --   --   BILITOT 0.9  --   --   GFRNONAA 39*  --  43*  GFRAA 46*  --  50*  ANIONGAP 13   --  8     Hematology Recent Labs  Lab 12/30/17 1250 12/30/17 1310 12/31/17 0243  WBC 7.1  --  7.7  RBC 4.05*  --  2.99*  HGB 12.1* 10.9* 9.0*  HCT 38.8* 32.0* 29.1*  MCV 95.8  --  97.3  MCH 29.9  --  30.1  MCHC 31.2  --  30.9  RDW 13.8  --  13.7  PLT 423*  --  360    Cardiac EnzymesNo results for input(s): TROPONINI in the last 168 hours.  Recent Labs  Lab 12/30/17 1305  TROPIPOC 0.00     BNP Recent Labs  Lab 12/30/17 1250  BNP 217.9*     DDimer No results for input(s): DDIMER in the last 168 hours.   Radiology    Ct Chest Wo Contrast  Result Date: 12/31/2017 CLINICAL DATA:  Possible pleural effusion EXAM: CT CHEST WITHOUT CONTRAST TECHNIQUE: Multidetector CT imaging of the chest was performed following  the standard protocol without IV contrast. COMPARISON:  Radiograph 12/30/2017, CT 11/10/2017 FINDINGS: Cardiovascular: Limited evaluation without intravenous contrast. Nonaneurysmal aorta. Moderate aortic atherosclerosis. Coronary vascular calcification. Aortic valve replacement. Borderline cardiomegaly. Trace pericardial effusion. Mediastinum/Nodes: Midline trachea. No thyroid mass. Soft tissue stranding within the right anterior mediastinum, likely due to recent operative status. Mildly prominent right paratracheal lymph node measuring up to 1 cm. Low right paratracheal lymph node measures 8 mm. Esophagus within normal limits. Small foci of gas within the right cardio phrenic fat with linear gas tracking to the right pleural surface anteriorly and into the subcutaneous soft tissues of the anterior chest wall. Lungs/Pleura: Small bilateral pleural effusions. Pleural effusion on the left appears slightly dense. No significant pneumothorax. Mild apical scarring. Small amount of loculated fluid along the right pulmonary fissure. Upper Abdomen: Surgical clips at the gallbladder fossa. No acute abnormality. Musculoskeletal: Status post plate and screw fixation across the right  anterior second rib cartilage and sternum. Adjacent soft tissue thickening consistent with operative change. IMPRESSION: 1. Small bilateral pleural effusions. Small amount of loculated pleural effusion along the right pulmonary fissure. Slight increased density in left pleural fluid possibly due to small amount of hemorrhagic material. 2. Interval postsurgical changes of the mediastinum. Linear gas collection extending from the right cardio phrenic fat to the soft tissues of the right anterior chest wall, likely related to previous chest tube placement. No significant right pneumothorax. Aortic Atherosclerosis (ICD10-I70.0). Electronically Signed   By: Donavan Foil M.D.   On: 12/31/2017 01:28   Dg Chest Portable 1 View  Result Date: 12/30/2017 CLINICAL DATA:  Tachycardia and hypotension. Status post cardioversion. History of aortic valve replacement. EXAM: PORTABLE CHEST 1 VIEW COMPARISON:  PA and lateral chest x-ray of December 27, 2017 FINDINGS: The lungs are reasonably well inflated. There is a small left pleural effusion. There is persistent density adjacent to the minor fissure. The heart is top-normal in size. The pulmonary vascularity is normal. There is calcification in the wall of the aortic arch. A. plate and screw are present over the right second costo sternal junction. IMPRESSION: Improving appearance of the infiltrate or atelectasis in the right mid lung. Persistent small left pleural effusion. No CHF. Electronically Signed   By: [name]  Martinique M.D.   On: 12/30/2017 13:11    Cardiac Studies   ECHO P  Patient Profile     72 y.o. male with recent mini thoracotomy aortic valve replacement, Dr. Roxy Manns who had cardioversion of wide-complex tachycardia (has underlying LBBB) in the setting of hypotension by EMS   Assessment & Plan    Wide complex tachycardia  - TELE with no adverse rhythms  - Son states EMS HR 170 prior to cardioversion. Would fit VT HR but could be atypical atrial flutter or  atrial tachycardia with underlying LBBB and hypotension. Can not confirm.   - Continue metoprolol  - Would not anticoagulate unless afib or flutter is confirmed  AVR s/p mini thoracotomy  - serous drainage reported at home  - per Dr. Roxy Manns. CT reviewed  - No evidence of pericardial effusion  Essential hypertension  - holding losartan HCT and Lasix  - perhaps this combination post op/ post ER evist contributed to hypotension as well  LBBB  - post op AVR  - stable  - ECHO pending.   CKD  - creat 1.5 from 1.7. Was 1.06 on 7/16. Hypotension AKI likely.   Anemia -  Hg 9.0 from 10.9. (hydration overnight)  Elevated LFT's  -  250. Likely hypoperfusion  So far reassuring      For questions or updates, please contact [place] Please consult [email or web address] for contact info under Cardiology/STEMI.      Signed, Candee Furbish, MD  12/31/2017, 6:25 AM

## 2018-01-01 LAB — COMPREHENSIVE METABOLIC PANEL
ALK PHOS: 135 U/L — AB (ref 38–126)
ALT: 204 U/L — ABNORMAL HIGH (ref 0–44)
AST: 136 U/L — ABNORMAL HIGH (ref 15–41)
Albumin: 3.2 g/dL — ABNORMAL LOW (ref 3.5–5.0)
Anion gap: 11 (ref 5–15)
BUN: 13 mg/dL (ref 8–23)
CO2: 25 mmol/L (ref 22–32)
Calcium: 8.7 mg/dL — ABNORMAL LOW (ref 8.9–10.3)
Chloride: 105 mmol/L (ref 98–111)
Creatinine, Ser: 1.4 mg/dL — ABNORMAL HIGH (ref 0.61–1.24)
GFR calc Af Amer: 57 mL/min — ABNORMAL LOW (ref >60)
GFR calc non Af Amer: 49 mL/min — ABNORMAL LOW (ref >60)
Glucose, Bld: 95 mg/dL (ref 70–99)
Potassium: 3.7 mmol/L (ref 3.5–5.1)
Sodium: 141 mmol/L (ref 135–145)
Total Bilirubin: 1 mg/dL (ref 0.3–1.2)
Total Protein: 6.7 g/dL (ref 6.5–8.1)

## 2018-01-01 LAB — CBC
HCT: 34.7 % — ABNORMAL LOW (ref 39.0–52.0)
HEMOGLOBIN: 10.7 g/dL — AB (ref 13.0–17.0)
MCH: 29.8 pg (ref 26.0–34.0)
MCHC: 30.8 g/dL (ref 30.0–36.0)
MCV: 96.7 fL (ref 78.0–100.0)
Platelets: 420 10*3/uL — ABNORMAL HIGH (ref 150–400)
RBC: 3.59 MIL/uL — ABNORMAL LOW (ref 4.22–5.81)
RDW: 13.5 % (ref 11.5–15.5)
WBC: 7.1 10*3/uL (ref 4.0–10.5)

## 2018-01-01 MED ORDER — METOPROLOL TARTRATE 50 MG PO TABS
50.0000 mg | ORAL_TABLET | Freq: Two times a day (BID) | ORAL | Status: DC
  Administered 2018-01-01: 50 mg via ORAL
  Filled 2018-01-01: qty 1

## 2018-01-01 NOTE — Progress Notes (Signed)
Discharge note. Patient and wife educated at bedside. RN educated on medications and when to take them, follow-up appointments, when to call the MD, and diet and activity recommendations. PIV removed without complications.   Pt discharged in wheelchair with NT.

## 2018-01-01 NOTE — Discharge Summary (Signed)
[facility]  Physician Discharge Summary   Patient ID: Eric [name] MRN: [id number] DOB/AGE: 1946-01-29 72 y.o.  Admit date: 12/30/2017 Discharge date: 01/01/2018  PCP: Colon Branch, MD  DISCHARGE DIAGNOSES:  Hypotension secondary to medications, resolved Suspected wide-complex tachycardia status post cardioversion Recent aortic valve replacement, stable Acute on chronic disease stage II, improved   RECOMMENDATIONS FOR OUTPATIENT FOLLOW UP: 1. Outpatient follow-up with cardiology 2. Outpatient monitoring of renal function  DISCHARGE CONDITION: fair  Diet recommendation: As before  Filed Weights   12/30/17 1310 12/30/17 1715  Weight: 85.7 kg (189 lb) 82.1 kg (181 lb)    INITIAL HISTORY: 72 year old Caucasian male with a past medical history of severe aortic stenosis status post aortic valve replacement earlier this year, hypothyroidism, hypertension presented with generalized weakness and hypotension.  He apparently was started on his Lasix recently and his outpatient medications were adjusted recently.  Patient also had some drainage from his right chest wall sutures.  Some of the cultures positive for Pseudomonas.  However patient denied any fever.  While EMS was transporting him they found that his heart rate was in the 170s.  They noted wide-complex tachycardia.  He was apparently cardioverted and converted to sinus rhythm.  Cardiology and cardiothoracic surgery consulted.  Consultants: Cardiology.  Cardiothoracic surgery  Procedures:  Transthoracic echocardiogram Study Conclusions  - Left ventricle: Abnormal septal motion Systolic function was normal. The estimated ejection fraction was in the range of 50% to 55%. - Aortic valve: Intuity Elite pericardial tissue valve. Normal appearing prosthetic AVR with no peri valvular regurgitation Valve area (VTI): 1.4 cm^2. Valve area (Vmean): 1.55 cm^2. - Mitral valve: Calcified annulus. Mildly thickened  leaflets . - Left atrium: The atrium was mildly dilated. - Right atrium: The atrium was mildly dilated. - Atrial septum: No defect or patent foramen ovale was identified. - Pulmonic valve: Peak gradient (S): 11 mm Hg   HOSPITAL COURSE:   Concern for sepsis Patient presented with generalized weakness and hypotension.  Considering recent positive wound cultures there was concern that this could be sepsis.  However patient did not have any erythema around his wounds.  These wounds do not look actively infected.    Cardiothoracic surgery also felt the same.  Patient was empirically started on broad-spectrum antibiotics.  Vancomycin was discontinued.  He was left on Zosyn.  Blood cultures are negative so far.  No clear indication to continue antibiotics at discharge.  It is quite possible his hypotension was medication induced.   Hypotension Most likely secondary to medications.  Patient was started on furosemide recently.  He was also started back on his ARB recently.  His creatinine was noted to be higher than his baseline so there might be an element of dehydration as well.  Blood pressures have improved.  Inciting agents have been held.  Status post aortic valve replacement This was done recently by Dr. Ricard Dillon with cardiothoracic surgery.    Echocardiogram showed stable findings.  Wide-complex tachycardia status post cardioversion/QT prolongation Unfortunately rhythm strips not available.  Patient was seen by cardiology.  Echocardiogram as above.  He should continue with his metoprolol.  No indication for anticoagulation or LifeVest at this time.  Telemetry did not show any recurrence.  Left pleural effusion Stable compared to previous films.    Outpatient monitoring.  Acute kidney injury on chronic kidney disease stage II Baseline creatinine around 1.  Creatinine on presentation was 1.7.  Patient recently started on furosemide.    And he was started  back on his ARB.  These medications  were held.  Creatinine has improved.  Outpatient monitoring.  Continue to hold the Lasix and the ARB.  Normocytic anemia Drop in hemoglobin is dilutional.  His hemoglobin at presentation was likely hemoconcentrated.  No evidence of overt bleeding.  Hypothyroidism Continue home medications.  Essential hypertension Patient was hypotensive at presentation.  Medications on hold.  Chronic diastolic CHF Seems to be stable currently.  He was actually more on the hypovolemic side at the time of admission.  Continue to hold diuretics as well as ARB.  Abnormal LFTs Most likely secondary to hypotension.  LFTs are improving.  Overall stable.  Cleared by cardiothoracic surgery as well as cardiology.  Okay for discharge home today.       PERTINENT LABS:  The results of significant diagnostics from this hospitalization (including imaging, microbiology, ancillary and laboratory) are listed below for reference.    Microbiology: Recent Results (from the past 240 hour(s))  Aerobic Culture (superficial specimen)     Status: None   Collection Time: 12/27/17  9:38 AM  Result Value Ref Range Status   Specimen Description CHEST  Final   Special Requests RIGHT  Final   Gram Stain   Final    NO WBC SEEN NO ORGANISMS SEEN Performed at [facility], [address] [place], [place] [address]    Culture FEW PSEUDOMONAS AERUGINOSA  Final   Report Status 12/29/2017 FINAL  Final   Organism ID, Bacteria PSEUDOMONAS AERUGINOSA  Final      Susceptibility   Pseudomonas aeruginosa - MIC*    CEFTAZIDIME <=1 SENSITIVE Sensitive     CIPROFLOXACIN <=0.25 SENSITIVE Sensitive     GENTAMICIN <=1 SENSITIVE Sensitive     IMIPENEM 2 SENSITIVE Sensitive     PIP/TAZO <=4 SENSITIVE Sensitive     CEFEPIME <=1 SENSITIVE Sensitive     * FEW PSEUDOMONAS AERUGINOSA  Culture, blood (Routine X 2) w Reflex to ID Panel     Status: None (Preliminary result)   Collection Time: 12/30/17  2:14 PM  Result Value  Ref Range Status   Specimen Description BLOOD RIGHT HAND  Final   Special Requests   Final    BOTTLES DRAWN AEROBIC ONLY Blood Culture adequate volume   Culture   Final    NO GROWTH 1 DAY Performed at [facility], [place] [address], [place], [place] [address]    Report Status PENDING  Incomplete  MRSA PCR Screening     Status: None   Collection Time: 12/30/17  5:14 PM  Result Value Ref Range Status   MRSA by PCR NEGATIVE NEGATIVE Final    Comment:        The GeneXpert MRSA Assay (FDA approved for NASAL specimens only), is one component of a comprehensive MRSA colonization surveillance program. It is not intended to diagnose MRSA infection nor to guide or monitor treatment for MRSA infections. Performed at [facility], [place] [address], [place], [place] [address]      Labs: Basic Metabolic Panel: Recent Labs  Lab 12/30/17 1250 12/30/17 1310 12/31/17 0243 01/01/18 0243  NA 138 137 139 141  K 3.8 3.8 4.1 3.7  CL 101 99 103 105  CO2 24  --  28 25  GLUCOSE 143* 145* 110* 95  BUN 18 20 17 13   CREATININE 1.68* 1.70* 1.55* 1.40*  CALCIUM 8.9  --  8.2* 8.7*  MG  --   --  2.3  --  Liver Function Tests: Recent Labs  Lab 12/30/17 1250 01/01/18 0243  AST 257* 136*  ALT 282* 204*  ALKPHOS 157* 135*  BILITOT 0.9 1.0  PROT 6.5 6.7  ALBUMIN 3.1* 3.2*   CBC: Recent Labs  Lab 12/30/17 1250 12/30/17 1310 12/31/17 0243 01/01/18 0243  WBC 7.1  --  7.7 7.1  HGB 12.1* 10.9* 9.0* 10.7*  HCT 38.8* 32.0* 29.1* 34.7*  MCV 95.8  --  97.3 96.7  PLT 423*  --  360 420*   BNP: BNP (last 3 results) Recent Labs    12/30/17 1250  BNP 217.9*      IMAGING STUDIES Dg Chest 2 View  Result Date: 12/27/2017 CLINICAL DATA:  Recent aortic valve replacement. Postop drainage from the right chest. EXAM: CHEST - 2 VIEW COMPARISON:  12/21/2017 FINDINGS: Sequelae of aortic valve replacement are again identified. Epicardial pacing wires have been removed. The  cardiomediastinal silhouette is unchanged with persistent increased soft tissue density in the right paratracheal and right hilar regions. Underlying heart size is likely normal. Aortic atherosclerosis is noted. There is improved aeration of the lung bases with decreased atelectasis. Small pleural effusions are present, and there is some loculated fluid in the right minor fissure. No pneumothorax is identified. A plate and screws are noted at the level of the right second costal cartilage, and right upper quadrant abdominal surgical clips are noted. IMPRESSION: Postsurgical changes with improved aeration of the lung bases. Persistent small pleural effusions. Electronically Signed   By: Logan Bores M.D.   On: 12/27/2017 08:32   Dg Chest 2 View  Result Date: 12/21/2017 CLINICAL DATA:  Chest tube placement EXAM: CHEST - 2 VIEW COMPARISON:  12/20/2017 FINDINGS: Normal heart size. Right chest tube has been removed. No pneumothorax visualized. Areas of platelike atelectasis noted within the right midlung and right base. There is a left pleural effusion which appears unchanged from previous exam. IMPRESSION: 1. No pneumothorax after chest tube removal. 2. Left lung platelike atelectasis. Electronically Signed   By: Kerby Moors M.D.   On: 12/21/2017 08:22   Dg Chest 2 View  Result Date: 12/20/2017 CLINICAL DATA:  Status post aortic valve replacement. EXAM: CHEST - 2 VIEW COMPARISON:  12/19/2017 FINDINGS: The heart size and mediastinal contours are stable. Stable appearance of prosthetic aortic valve and reconstruction plate after mini thoracotomy. Right chest tube remains without pneumothorax. Stable right basilar atelectasis. Slightly improved aeration of the left lung base. Tiny bilateral posterior pleural effusions. No pneumothorax. Stable appearance of epicardial pacing leads. IMPRESSION: Stable right basilar atelectasis. Improved aeration at the left lung base. Tiny bilateral posterior pleural effusions. No  pneumothorax. Electronically Signed   By: Aletta Edouard M.D.   On: 12/20/2017 09:47   Dg Chest 2 View  Result Date: 12/15/2017 CLINICAL DATA:  Preoperative evaluation for AVR, history hypertension, aortic stenosis, former smoker EXAM: CHEST - 2 VIEW COMPARISON:  09/30/2017 FINDINGS: Normal heart size, mediastinal contours, and pulmonary vascularity. Lungs clear. No pleural effusion or pneumothorax. Bones unremarkable. IMPRESSION: Normal exam. Electronically Signed   By: Lavonia Dana M.D.   On: 12/15/2017 15:22   Ct Chest Wo Contrast  Result Date: 12/31/2017 CLINICAL DATA:  Possible pleural effusion EXAM: CT CHEST WITHOUT CONTRAST TECHNIQUE: Multidetector CT imaging of the chest was performed following the standard protocol without IV contrast. COMPARISON:  Radiograph 12/30/2017, CT 11/10/2017 FINDINGS: Cardiovascular: Limited evaluation without intravenous contrast. Nonaneurysmal aorta. Moderate aortic atherosclerosis. Coronary vascular calcification. Aortic valve replacement. Borderline cardiomegaly. Trace pericardial effusion. Mediastinum/Nodes:  Midline trachea. No thyroid mass. Soft tissue stranding within the right anterior mediastinum, likely due to recent operative status. Mildly prominent right paratracheal lymph node measuring up to 1 cm. Low right paratracheal lymph node measures 8 mm. Esophagus within normal limits. Small foci of gas within the right cardio phrenic fat with linear gas tracking to the right pleural surface anteriorly and into the subcutaneous soft tissues of the anterior chest wall. Lungs/Pleura: Small bilateral pleural effusions. Pleural effusion on the left appears slightly dense. No significant pneumothorax. Mild apical scarring. Small amount of loculated fluid along the right pulmonary fissure. Upper Abdomen: Surgical clips at the gallbladder fossa. No acute abnormality. Musculoskeletal: Status post plate and screw fixation across the right anterior second rib cartilage and  sternum. Adjacent soft tissue thickening consistent with operative change. IMPRESSION: 1. Small bilateral pleural effusions. Small amount of loculated pleural effusion along the right pulmonary fissure. Slight increased density in left pleural fluid possibly due to small amount of hemorrhagic material. 2. Interval postsurgical changes of the mediastinum. Linear gas collection extending from the right cardio phrenic fat to the soft tissues of the right anterior chest wall, likely related to previous chest tube placement. No significant right pneumothorax. Aortic Atherosclerosis (ICD10-I70.0). Electronically Signed   By: Donavan Foil M.D.   On: 12/31/2017 01:28   Dg Chest Portable 1 View  Result Date: 12/30/2017 CLINICAL DATA:  Tachycardia and hypotension. Status post cardioversion. History of aortic valve replacement. EXAM: PORTABLE CHEST 1 VIEW COMPARISON:  PA and lateral chest x-ray of December 27, 2017 FINDINGS: The lungs are reasonably well inflated. There is a small left pleural effusion. There is persistent density adjacent to the minor fissure. The heart is top-normal in size. The pulmonary vascularity is normal. There is calcification in the wall of the aortic arch. A. plate and screw are present over the right second costo sternal junction. IMPRESSION: Improving appearance of the infiltrate or atelectasis in the right mid lung. Persistent small left pleural effusion. No CHF. Electronically Signed   By: [name]  Martinique M.D.   On: 12/30/2017 13:11   Dg Chest Port 1 View  Result Date: 12/19/2017 CLINICAL DATA:  Chest pain EXAM: PORTABLE CHEST 1 VIEW COMPARISON:  December 18, 2017 FINDINGS: Swan-Ganz catheter has been removed. Cordis tip is in the left innominate vein. There is a chest tube on the right. Temporary pacemaker wires are attached to the right heart. There is no evident pneumothorax. There is bibasilar atelectasis. No consolidation. Heart is mildly enlarged with pulmonary vascularity normal. Patient  is status post aortic valve replacement. Postoperative changes noted in the right medial mid thorax with screw and plate fixation. IMPRESSION: Bibasilar atelectasis. Tube and catheter positions as described without pneumothorax. Stable cardiac prominence. No new opacity. Electronically Signed   By: Lowella Grip III M.D.   On: 12/19/2017 08:24   Dg Chest Port 1 View  Result Date: 12/18/2017 CLINICAL DATA:  Status post aortic valve replacement. EXAM: PORTABLE CHEST 1 VIEW COMPARISON:  Radiograph of December 17, 2017. FINDINGS: Stable cardiomegaly. Aortic valve prosthesis is noted. Left subclavian Swan-Ganz catheter is noted with tip in expected position of main pulmonary artery. Right-sided chest tube is unchanged in position. No pneumothorax is noted. Mild bibasilar subsegmental listhesis is noted. Bony thorax is unremarkable. IMPRESSION: Stable position of right-sided chest tube without pneumothorax. Mild bibasilar subsegmental atelectasis. Electronically Signed   By: Marijo Conception, M.D.   On: 12/18/2017 09:19   Dg Chest Port 1 View  Result Date:  12/17/2017 CLINICAL DATA:  72 y/o M; status post minimally invasive aortic valve replacement. EXAM: PORTABLE CHEST 1 VIEW COMPARISON:  12/15/2017 chest radiograph FINDINGS: Aortic valve replacement. Swan catheter tip projects over the mid mediastinum. Right upper quadrant surgical clips. Low lung volumes, bilateral minor atelectasis, small bilateral effusions. No pneumothorax. No acute fracture identified IMPRESSION: Low lung volumes, minor atelectasis of lungs bilaterally, small bilateral effusions. No pneumothorax. Electronically Signed   By: Kristine Garbe M.D.   On: 12/17/2017 14:51   Dg Abd 2 Views  Result Date: 12/20/2017 CLINICAL DATA:  Nausea and vomiting after aortic valve replacement surgery. EXAM: ABDOMEN - 2 VIEW COMPARISON:  None. FINDINGS: Scattered air in nondilated colon and small bowel without evidence of bowel obstruction or  significant ileus. No free air identified. No abnormal calcifications. Bony structures are unremarkable. IMPRESSION: No significant findings. Electronically Signed   By: Aletta Edouard M.D.   On: 12/20/2017 09:49    DISCHARGE EXAMINATION: Vitals:   01/01/18 0700 01/01/18 0746 01/01/18 1019 01/01/18 1100  BP: 134/75 134/75  129/75  Pulse: 90 92 (!) 101 81  Resp: (!) 22 (!) 25  18  Temp: 97.7 F (36.5 C)   97.7 F (36.5 C)  TempSrc: Oral   Oral  SpO2: 97% 97%  98%  Weight:      Height:       General appearance: alert, cooperative, appears stated age and no distress Resp: clear to auscultation bilaterally Cardio: regular rate and rhythm, S1, S2 normal, no murmur, click, rub or gallop GI: soft, non-tender; bowel sounds normal; no masses,  no organomegaly  DISPOSITION: Home  Discharge Instructions    Call MD for:  difficulty breathing, headache or visual disturbances   Complete by:  As directed    Call MD for:  extreme fatigue   Complete by:  As directed    Call MD for:  persistant dizziness or light-headedness   Complete by:  As directed    Call MD for:  persistant nausea and vomiting   Complete by:  As directed    Call MD for:  severe uncontrolled pain   Complete by:  As directed    Call MD for:  temperature >100.4   Complete by:  As directed    Diet - low sodium heart healthy   Complete by:  As directed    Discharge instructions   Complete by:  As directed    Follow up with your cardiologist as scheduled. Take medications as instructed.  You were cared for by a hospitalist during your hospital stay. If you have any questions about your discharge medications or the care you received while you were in the hospital after you are discharged, you can call the unit and asked to speak with the hospitalist on call if the hospitalist that took care of you is not available. Once you are discharged, your primary care physician will handle any further medical issues. Please note that NO  REFILLS for any discharge medications will be authorized once you are discharged, as it is imperative that you return to your primary care physician (or establish a relationship with a primary care physician if you do not have one) for your aftercare needs so that they can reassess your need for medications and monitor your lab values. If you do not have a primary care physician, you can call [phone] for a physician referral.   Increase activity slowly   Complete by:  As directed  Allergies as of 01/01/2018      Reactions   Atorvastatin Other (See Comments)   REACTION: myalgia   Ezetimibe Other (See Comments)   REACTION: myalgia      Medication List    STOP taking these medications   furosemide 40 MG tablet Commonly known as:  LASIX   losartan-hydrochlorothiazide 100-12.5 MG tablet Commonly known as:  HYZAAR     TAKE these medications   aspirin 325 MG EC tablet Take 1 tablet (325 mg total) by mouth daily.   levothyroxine 88 MCG tablet Commonly known as:  SYNTHROID, LEVOTHROID Take 1 tablet (88 mcg total) by mouth daily before breakfast.   metoprolol tartrate 50 MG tablet Commonly known as:  LOPRESSOR Take 1 tablet (50 mg total) by mouth 2 (two) times daily.   oxyCODONE 5 MG immediate release tablet Commonly known as:  Oxy IR/ROXICODONE Take 1 tablet (5 mg total) by mouth every 6 (six) hours as needed for severe pain.   rosuvastatin 20 MG tablet Commonly known as:  CRESTOR Take 1 tablet (20 mg total) by mouth daily.        Follow-up Information    [name], MD Follow up on 01/13/2018.   Specialty:  Cardiology Why:  at 4:00 PM Contact information: [place] [address] [phone]           TOTAL DISCHARGE TIME: 35 minutes  [facility] Pager [phone]  01/01/2018, 12:31 PM

## 2018-01-01 NOTE — Progress Notes (Addendum)
      [address]       [place] [address]             [phone]     CARDIOTHORACIC SURGERY PROGRESS NOTE  Subjective: No complaints.  Feels fine.  Slept well.  Objective: Vital signs in last 24 hours: Temp:  [97.7 F (36.5 C)-98.1 F (36.7 C)] 97.7 F (36.5 C) (07/25 0700) Pulse Rate:  [82-97] 90 (07/25 0700) Cardiac Rhythm: Normal sinus rhythm (07/25 0300) Resp:  [16-31] 22 (07/25 0700) BP: (108-134)/(61-75) 134/75 (07/25 0700) SpO2:  [97 %-99 %] 97 % (07/25 0700)  Physical Exam:  Rhythm:   sinus  Breath sounds: clear  Heart sounds:  RRR  Incisions:  Clean and dry.  No drainage from chest tube site  Abdomen:  Soft, non-distended, non-tender  Extremities:  Warm, well-perfused   Intake/Output from previous day: 07/24 0701 - 07/25 0700 In: 780 [P.O.:480; I.V.:300] Out: 2250 [Urine:2250] Intake/Output this shift: No intake/output data recorded.  Lab Results: Recent Labs    12/31/17 0243 01/01/18 0243  WBC 7.7 7.1  HGB 9.0* 10.7*  HCT 29.1* 34.7*  PLT 360 420*   BMET:  Recent Labs    12/31/17 0243 01/01/18 0243  NA 139 141  K 4.1 3.7  CL 103 105  CO2 28 25  GLUCOSE 110* 95  BUN 17 13  CREATININE 1.55* 1.40*  CALCIUM 8.2* 8.7*    CBG (last 3)  No results for input(s): GLUCAP in the last 72 hours. PT/INR:   Recent Labs    12/30/17 1250  LABPROT 14.5  INR 1.14    CXR:  N/A  Assessment/Plan:  No further arrhythmias, maintaining NSR w/ stable BP on reduced dose metoprolol, ARB and diuretics on hold No signs of infection whatsoever Renal function returning to baseline   Probably okay for d/c home but will defer to Dr Marlou Porch and cardiology team whether or not medications should be adjusted, anticoagulation started, or LifeVest should be considered.  I favor none of the above although it might be reasonable to increase Metoprolol back to home dose 50 mg bid, either now or later this week depending on HR and BP  Rexene Alberts, MD 01/01/2018 7:59 AM

## 2018-01-01 NOTE — Progress Notes (Signed)
CARDIAC REHAB PHASE I   PRE:  Rate/Rhythm: 97 SR  BP:  Supine: 99/63  Sitting:   Standing:    SaO2: 97%RA  MODE:  Ambulation: 580 ft   POST:  Rate/Rhythm: 109 ST  BP:  Supine:   Sitting: 141/74  Standing:    SaO2: 98%RA 0940-1000 Pt walked 580 ft on RA with steady gait and tolerated well. No dizziness.    Graylon Good, RN BSN  01/01/2018 9:53 AM

## 2018-01-01 NOTE — Progress Notes (Signed)
Progress Note  Patient Name: Eric [name] Date of Encounter: 01/01/2018  Primary Cardiologist: Shirlee More, MD   Subjective   Feels great, no SOB, no CP  Inpatient Medications    Scheduled Meds: . heparin injection (subcutaneous)  5,000 Units Subcutaneous Q8H  . levothyroxine  88 mcg Oral QAC breakfast  . metoprolol tartrate  50 mg Oral BID  . rosuvastatin  20 mg Oral Daily   Continuous Infusions: . sodium chloride 75 mL/hr at 01/01/18 0300  . piperacillin-tazobactam (ZOSYN)  IV 3.375 g (01/01/18 0520)   PRN Meds:    Vital Signs    Vitals:   12/31/17 2342 01/01/18 0300 01/01/18 0345 01/01/18 0700  BP: 108/65  127/68 134/75  Pulse: 82 90  90  Resp: (!) 27 (!) 22  (!) 22  Temp: 98.1 F (36.7 C)  98.1 F (36.7 C) 97.7 F (36.5 C)  TempSrc: Oral  Oral Oral  SpO2: 97% 97% 98% 97%  Weight:      Height:        Intake/Output Summary (Last 24 hours) at 01/01/2018 0816 Last data filed at 01/01/2018 0300 Gross per 24 hour  Intake 780 ml  Output 2000 ml  Net -1220 ml   Filed Weights   12/30/17 1310 12/30/17 1715  Weight: 189 lb (85.7 kg) 181 lb (82.1 kg)    Telemetry    LBBB, NSR, occasional PAC's (trigeminy pattern) NO VT, heart rate 90's at times - Personally Reviewed  ECG    No new - Personally Reviewed  Physical Exam   GEN: No acute distress.   Neck: No JVD Cardiac: RRR, no murmurs, rubs, or gallops. Post op changes Respiratory: Clear to auscultation bilaterally. GI: Soft, nontender, non-distended  MS: No edema; No deformity. Neuro:  Nonfocal  Psych: Normal affect   Labs    Chemistry Recent Labs  Lab 12/30/17 1250 12/30/17 1310 12/31/17 0243 01/01/18 0243  NA 138 137 139 141  K 3.8 3.8 4.1 3.7  CL 101 99 103 105  CO2 24  --  28 25  GLUCOSE 143* 145* 110* 95  BUN 18 20 17 13   CREATININE 1.68* 1.70* 1.55* 1.40*  CALCIUM 8.9  --  8.2* 8.7*  PROT 6.5  --   --  6.7  ALBUMIN 3.1*  --   --  3.2*  AST 257*  --   --  136*  ALT 282*  --    --  204*  ALKPHOS 157*  --   --  135*  BILITOT 0.9  --   --  1.0  GFRNONAA 39*  --  43* 49*  GFRAA 46*  --  50* 57*  ANIONGAP 13  --  8 11     Hematology Recent Labs  Lab 12/30/17 1250 12/30/17 1310 12/31/17 0243 01/01/18 0243  WBC 7.1  --  7.7 7.1  RBC 4.05*  --  2.99* 3.59*  HGB 12.1* 10.9* 9.0* 10.7*  HCT 38.8* 32.0* 29.1* 34.7*  MCV 95.8  --  97.3 96.7  MCH 29.9  --  30.1 29.8  MCHC 31.2  --  30.9 30.8  RDW 13.8  --  13.7 13.5  PLT 423*  --  360 420*    Cardiac EnzymesNo results for input(s): TROPONINI in the last 168 hours.  Recent Labs  Lab 12/30/17 1305  TROPIPOC 0.00     BNP Recent Labs  Lab 12/30/17 1250  BNP 217.9*     DDimer No results for input(s): DDIMER in the  last 168 hours.   Radiology    Ct Chest Wo Contrast  Result Date: 12/31/2017 CLINICAL DATA:  Possible pleural effusion EXAM: CT CHEST WITHOUT CONTRAST TECHNIQUE: Multidetector CT imaging of the chest was performed following the standard protocol without IV contrast. COMPARISON:  Radiograph 12/30/2017, CT 11/10/2017 FINDINGS: Cardiovascular: Limited evaluation without intravenous contrast. Nonaneurysmal aorta. Moderate aortic atherosclerosis. Coronary vascular calcification. Aortic valve replacement. Borderline cardiomegaly. Trace pericardial effusion. Mediastinum/Nodes: Midline trachea. No thyroid mass. Soft tissue stranding within the right anterior mediastinum, likely due to recent operative status. Mildly prominent right paratracheal lymph node measuring up to 1 cm. Low right paratracheal lymph node measures 8 mm. Esophagus within normal limits. Small foci of gas within the right cardio phrenic fat with linear gas tracking to the right pleural surface anteriorly and into the subcutaneous soft tissues of the anterior chest wall. Lungs/Pleura: Small bilateral pleural effusions. Pleural effusion on the left appears slightly dense. No significant pneumothorax. Mild apical scarring. Small amount of  loculated fluid along the right pulmonary fissure. Upper Abdomen: Surgical clips at the gallbladder fossa. No acute abnormality. Musculoskeletal: Status post plate and screw fixation across the right anterior second rib cartilage and sternum. Adjacent soft tissue thickening consistent with operative change. IMPRESSION: 1. Small bilateral pleural effusions. Small amount of loculated pleural effusion along the right pulmonary fissure. Slight increased density in left pleural fluid possibly due to small amount of hemorrhagic material. 2. Interval postsurgical changes of the mediastinum. Linear gas collection extending from the right cardio phrenic fat to the soft tissues of the right anterior chest wall, likely related to previous chest tube placement. No significant right pneumothorax. Aortic Atherosclerosis (ICD10-I70.0). Electronically Signed   By: Donavan Foil M.D.   On: 12/31/2017 01:28   Dg Chest Portable 1 View  Result Date: 12/30/2017 CLINICAL DATA:  Tachycardia and hypotension. Status post cardioversion. History of aortic valve replacement. EXAM: PORTABLE CHEST 1 VIEW COMPARISON:  PA and lateral chest x-ray of December 27, 2017 FINDINGS: The lungs are reasonably well inflated. There is a small left pleural effusion. There is persistent density adjacent to the minor fissure. The heart is top-normal in size. The pulmonary vascularity is normal. There is calcification in the wall of the aortic arch. A. plate and screw are present over the right second costo sternal junction. IMPRESSION: Improving appearance of the infiltrate or atelectasis in the right mid lung. Persistent small left pleural effusion. No CHF. Electronically Signed   By: [name]  Martinique M.D.   On: 12/30/2017 13:11    Cardiac Studies   ECHO - normal EF - Left ventricle: Abnormal septal motion Systolic function was   normal. The estimated ejection fraction was in the range of 50%   to 55%. - Aortic valve: Intuity Elite pericardial tissue  valve. Normal   appearing prosthetic AVR with no peri valvular regurgitation   Valve area (VTI): 1.4 cm^2. Valve area (Vmean): 1.55 cm^2. - Mitral valve: Calcified annulus. Mildly thickened leaflets . - Left atrium: The atrium was mildly dilated. - Right atrium: The atrium was mildly dilated. - Atrial septum: No defect or patent foramen ovale was identified. - Pulmonic valve: Peak gradient (S): 11 mm Hg.  Patient Profile     72 y.o. male with recent mini thoracotomy aortic valve replacement, Dr. Roxy Manns who had cardioversion of wide-complex tachycardia (has underlying LBBB) in the setting of hypotension by EMS     Assessment & Plan     Wide complex tachycardia  - TELE with no adverse  rhythms  - Son states EMS HR 170 prior to cardioversion. Would fit VT HR but could be atypical atrial flutter or atrial tachycardia with underlying LBBB and hypotension. Can not confirm.   - Continue metoprolol with increase back to 50 BID  - Would not anticoagulate unless afib or flutter is confirmed. No LifeVest.   AVR s/p mini thoracotomy  - serous drainage reported at home  - per Dr. Roxy Manns. CT reviewed  - No evidence of pericardial effusion, reassuring  Essential hypertension  - STOPPED losartan HCT and Lasix continue to HOLD. DO NOT restart as outpatient  - perhaps this combination post op/ post ER evist contributed to hypotension as well  LBBB  - post op AVR  - stable  - ECHO reassuring.   CKD  - creat 1.90from 1.7. Was 1.06 on 7/16. Hypotension AKI likely.   Anemia -  Mild post op 10.7  Elevated LFT's  - 250 to 204 improving. Likely hypoperfusion  OK with DC  [facility] will sign off.   Medication Recommendations:  As in bold above.  Other recommendations (labs, testing, etc):  none Follow up as an outpatient:  Dr. Bettina Gavia, will set up appt.   For questions or updates, please contact [place] Please consult [email or web address] for contact info under Cardiology/STEMI.        Signed, Candee Furbish, MD  01/01/2018, 8:16 AM

## 2018-01-04 LAB — CULTURE, BLOOD (ROUTINE X 2)
CULTURE: NO GROWTH
Special Requests: ADEQUATE

## 2018-01-05 ENCOUNTER — Telehealth: Payer: Self-pay

## 2018-01-05 NOTE — Telephone Encounter (Signed)
Called patient who states he has follow up appointments with Surgeon and Cardiologist. States he will schedule with Dr. Larose Kells if needed before next scheduled appointement.

## 2018-01-05 NOTE — Telephone Encounter (Signed)
Returned patoemts call. States he had Copied from [place] [id number]. Topic: Inquiry >> Jan 05, 2018 12:49 PM Mylinda Latina, NT wrote: Reason for CRM: Patient called and states he missed a call from Santiago Glad on an [place] F/u. Please call CB# [phone]

## 2018-01-06 ENCOUNTER — Ambulatory Visit: Payer: Medicare Other

## 2018-01-12 ENCOUNTER — Ambulatory Visit (INDEPENDENT_AMBULATORY_CARE_PROVIDER_SITE_OTHER): Payer: Self-pay | Admitting: Physician Assistant

## 2018-01-12 ENCOUNTER — Other Ambulatory Visit: Payer: Self-pay

## 2018-01-12 ENCOUNTER — Ambulatory Visit
Admission: RE | Admit: 2018-01-12 | Discharge: 2018-01-12 | Disposition: A | Discharge status: Home / Self Care | Payer: Medicare Other | Source: Ambulatory Visit | Attending: Thoracic Surgery (Cardiothoracic Vascular Surgery) | Admitting: Thoracic Surgery (Cardiothoracic Vascular Surgery)

## 2018-01-12 ENCOUNTER — Ambulatory Visit: Payer: Medicare Other

## 2018-01-12 ENCOUNTER — Other Ambulatory Visit: Payer: Self-pay | Admitting: Thoracic Surgery (Cardiothoracic Vascular Surgery)

## 2018-01-12 VITALS — BP 150/82 | HR 84 | Resp 18 | Ht 68.0 in | Wt 181.2 lb

## 2018-01-12 DIAGNOSIS — Z952 Presence of prosthetic heart valve: Secondary | ICD-10-CM

## 2018-01-12 DIAGNOSIS — Z953 Presence of xenogenic heart valve: Secondary | ICD-10-CM

## 2018-01-12 MED ORDER — LOSARTAN POTASSIUM 25 MG PO TABS: 25.0000 mg | ORAL_TABLET | Freq: Every day | ORAL | 5 refills | Status: DC

## 2018-01-12 NOTE — Progress Notes (Signed)
HPI:  Patient returns for routine postoperative follow-up having undergone Mini AVR.  The patient's early postoperative recovery while in the hospital was notable for brief episode of Atrial Fibrillation with RVR. He converted spontaneously without intervention.  Since hospital discharge the patient reports he was in the ED on 2 occasions.  He presented 2 days after discharge due to drainage of serous fluid from chest tube sites.  He was evaluated by Dr. Lucianne Lei trigt who started the patient on lasix and restarted his home antihypertensive medications.  A few days later he again presented to the ED with hypotension and palpitations.  He was found to be in wide complex tachycardia and was admitted for further care. He underwent cardioversion and has been maintaining NSR ever since and his antihypertensive medications were stopped.  Since that time patient states he has been doing very well.  He states he feels better than he did prior to surgery.  He has kept track of his blood pressures since hospital discharge which have all been high.  His has no lower extremity edema, denies any further palpitations, and is ambulating without difficulty.   Current Outpatient Medications  Medication Sig Dispense Refill  . aspirin EC 325 MG EC tablet Take 1 tablet (325 mg total) by mouth daily. 30 tablet 0  . levothyroxine (SYNTHROID, LEVOTHROID) 88 MCG tablet Take 1 tablet (88 mcg total) by mouth daily before breakfast. 30 tablet 5  . metoprolol tartrate (LOPRESSOR) 50 MG tablet Take 1 tablet (50 mg total) by mouth 2 (two) times daily. 60 tablet 5  . oxyCODONE (OXY IR/ROXICODONE) 5 MG immediate release tablet Take 1 tablet (5 mg total) by mouth every 6 (six) hours as needed for severe pain. 30 tablet 0  . rosuvastatin (CRESTOR) 20 MG tablet Take 1 tablet (20 mg total) by mouth daily. 30 tablet 5  . losartan (COZAAR) 25 MG tablet Take 1 tablet (25 mg total) by mouth daily. 30 tablet 5   No current facility-administered  medications for this visit.     Physical Exam:  BP (!) 150/82 (BP Location: Right Arm, Patient Position: Sitting, Cuff Size: Normal)   Pulse 84   Resp 18   Ht 5\' 8"  (1.727 m)   Wt 181 lb 3.2 oz (82.2 kg)   SpO2 96% Comment: RA  BMI 27.55 kg/m   Gen: no apparent distress Heart: RRR Lungs: CTA bilaterally Ext: no edema Incisions: well healed  Diagnostic Tests:  CXR; no pleural effusions, rib plating present, valve prosthesis present  A/P;  1. S/p AVR doing very well, ok at start cardiac rehab 2. HTN- unfortunately he was restarted on home Cozaar at 100 with 12.5 mg HCTZ..  This was too high of a dosage for him.  He remains hypertensive with SBP in the 160-170s.  Will restart Cozaar at 25 mg daily.  Instructed patient to continue recording his BP readings 3. Activity- increase as tolerated, may resume driving, would avoid strenuous activity for another 2 weeks 4. RTC in October with Dr. Geraldo Docker, PA-C [facility] [phone]

## 2018-01-12 NOTE — Patient Instructions (Signed)
You may return to driving an automobile as long as you are no longer requiring oral narcotic pain relievers during the daytime.  It would be wise to start driving only short distances during the daylight and gradually increase from there as you feel comfortable.   Endocarditis is a potentially serious infection of heart valves or inside lining of the heart.  It occurs more commonly in patients with diseased heart valves (such as patient's with aortic or mitral valve disease) and in patients who have undergone heart valve repair or replacement.  Certain surgical and dental procedures may put you at risk, such as dental cleaning, other dental procedures, or any surgery involving the respiratory, urinary, gastrointestinal tract, gallbladder or prostate gland.   To minimize your chances for develooping endocarditis, maintain good oral health and seek prompt medical attention for any infections involving the mouth, teeth, gums, skin or urinary tract.    Always notify your doctor or dentist about your underlying heart valve condition before having any invasive procedures. You will need to take antibiotics before certain procedures, including all routine dental cleanings or other dental procedures.  Your cardiologist or dentist should prescribe these antibiotics for you to be taken ahead of time.      Make every effort to maintain a "heart-healthy" lifestyle with regular physical exercise and adherence to a low-fat, low-carbohydrate diet.  Continue to seek regular follow-up appointments with your primary care physician and/or cardiologist.   You may resume unrestricted physical activity without any particular limitations at this time.

## 2018-01-13 ENCOUNTER — Telehealth (HOSPITAL_COMMUNITY): Payer: Self-pay

## 2018-01-13 ENCOUNTER — Encounter: Payer: Self-pay | Admitting: Cardiology

## 2018-01-13 ENCOUNTER — Ambulatory Visit: Payer: Medicare Other | Admitting: Cardiology

## 2018-01-13 ENCOUNTER — Ambulatory Visit (INDEPENDENT_AMBULATORY_CARE_PROVIDER_SITE_OTHER): Payer: Medicare Other | Admitting: Cardiology

## 2018-01-13 VITALS — BP 144/70 | HR 86 | Ht 68.0 in | Wt 182.4 lb

## 2018-01-13 DIAGNOSIS — I472 Ventricular tachycardia: Secondary | ICD-10-CM | POA: Diagnosis not present

## 2018-01-13 DIAGNOSIS — R000 Tachycardia, unspecified: Secondary | ICD-10-CM

## 2018-01-13 DIAGNOSIS — I952 Hypotension due to drugs: Secondary | ICD-10-CM

## 2018-01-13 DIAGNOSIS — I447 Left bundle-branch block, unspecified: Secondary | ICD-10-CM | POA: Diagnosis not present

## 2018-01-13 DIAGNOSIS — I10 Essential (primary) hypertension: Secondary | ICD-10-CM | POA: Diagnosis not present

## 2018-01-13 DIAGNOSIS — Z953 Presence of xenogenic heart valve: Secondary | ICD-10-CM | POA: Diagnosis not present

## 2018-01-13 NOTE — Patient Instructions (Signed)
Medication Instructions:  Your physician recommends that you continue on your current medications as directed. Please refer to the Current Medication list given to you today.   Labwork: Your physician recommends that you return for lab work today: CBC, BMP.   Testing/Procedures: You had an EKG today.   Your physician has recommended that you wear a holter monitor. Holter monitors are medical devices that record the heart's electrical activity. Doctors most often use these monitors to diagnose arrhythmias. Arrhythmias are problems with the speed or rhythm of the heartbeat. The monitor is a small, portable device. You can wear one while you do your normal daily activities. This is usually used to diagnose what is causing palpitations/syncope (passing out). Wear for 14 days.   Follow-Up: Your physician recommends that you schedule a follow-up appointment in: 6 weeks.   If you need a refill on your cardiac medications before your next appointment, please call your pharmacy.   Thank you for choosing [facility]! Robyne Peers, RN [phone]     Holter Monitoring A Holter monitor is a small device that is used to detect abnormal heart rhythms. It clips to your clothing and is connected by wires to flat, sticky disks (electrodes) that attach to your chest. It is worn continuously for 24-48 hours. Follow these instructions at home:  Wear your Holter monitor at all times, even while exercising and sleeping, for as long as directed by your health care provider.  Make sure that the Holter monitor is safely clipped to your clothing or close to your body as recommended by your health care provider.  Do not get the monitor or wires wet.  Do not put body lotion or moisturizer on your chest.  Keep your skin clean.  Keep a diary of your daily activities, such as walking and doing chores. If you feel that your heartbeat is abnormal or that your heart is fluttering or skipping a  beat: ? Record what you are doing when it happens. ? Record what time of day the symptoms occur.  Return your Holter monitor as directed by your health care provider.  Keep all follow-up visits as directed by your health care provider. This is important. Get help right away if:  You feel lightheaded or you faint.  You have trouble breathing.  You feel pain in your chest, upper arm, or jaw.  You feel sick to your stomach and your skin is pale, cool, or damp.  You heartbeat feels unusual or abnormal. This information is not intended to replace advice given to you by your health care provider. Make sure you discuss any questions you have with your health care provider. Document Released: 02/23/2004 Document Revised: 11/02/2015 Document Reviewed: 01/03/2014 Elsevier Interactive Patient Education  [employer].

## 2018-01-13 NOTE — Telephone Encounter (Signed)
Called pt to see if he would like to participate in CR, he stated he was on the way to a doctor appt. And would like for me to give him a call back tomorrow.  Will follow up with pt.

## 2018-01-13 NOTE — Telephone Encounter (Signed)
Pt insurance is active and benefits verified through [place] $0.00, DED $0.00/$0.00 met, out of pocket $3,200.00/$3,112.13 met, co-insurance 10%. No pre-authorization. Passport, 01/13/18 @ 1:58PM, SZJ#[id number]

## 2018-01-13 NOTE — Progress Notes (Signed)
Cardiology Office Note:    Date:  01/13/2018   ID:  Eric [name], DOB June 20, 1945, MRN [id number]  PCP:  Colon Branch, MD  Cardiologist:  Shirlee More, MD    Referring MD: Colon Branch, MD    ASSESSMENT:    1. 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 Essential hypertension   3. Wide-complex tachycardia ([place])   4. Hypotension due to drugs   5. Left bundle branch block (LBBB)    PLAN:    In order of problems listed above:  1. Stable he seems to have made a good recovery will start cardiac rehabilitation has had no further wound drainage.  His postoperative echocardiogram shows normal valvular structure and function is baseline. 2. Poorly controlled just started an ARB.  I will recheck renal function and CBC with renal insufficiency during his hospitalization 3. Likely postoperative atrial fibrillation or flutter continue beta-blocker and you utilize a 14-day long-term monitor to screen for ventricular arrhythmia 4. Resolved 5. Stable postoperative EKG pattern   Next appointment: 6 weeks   Medication Adjustments/Labs and Tests Ordered: Current medicines are reviewed at length with the patient today.  Concerns regarding medicines are outlined above.  No orders of the defined types were placed in this encounter.  No orders of the defined types were placed in this encounter.   Chief Complaint  Patient presents with  . Hospitalization Follow-up    History of Present Illness:    Eric [name] is a 72 y.o. male with a hx of aortic stensosis last seen by me 09/30/17. He underwent minimally invasive aortic valve replacement with bioprosthesis 12/17/2017. He had a readmission to the hospital 12/30/2017 discharge 01/01/2018 with wide-complex tachycardia with cardioversion prior to hospitalization and hypotension secondary to medications.  Echocardiogram during that admission showed ejection fraction of 50 to 55% and normal function of the  pericardial tissue valve.  Compliance with diet, lifestyle and medications: Yes he has flares since his hospital discharge  Regained his strength has had no angina dyspnea palpitation or syncope.  He had wound drainage ceased was seen by CT surgery yesterday.  EKG in my office today shows stable pattern left bundle branch block sinus rhythm Past Medical History:  Diagnosis Date  . Abnormal LFTs   . Anxiety   . Aortic stenosis 04/15/2011  . Dizziness 04/21/2014  . Dyspnea   . Heart murmur   . Hyperglycemia 10/20/2014  . Hyperlipidemia   . Hypertension   . Hypothyroidism   . Left bundle branch block (LBBB) 12/18/2017  . Macular edema    Dr  Sherlynn Stalls   . 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 AORTIC VALVE REPLACEMENT N/A 12/17/2017   Procedure: MINIMALLY INVASIVE AORTIC VALVE REPLACEMENT (AVR) [ using Edwards Intuity Valve Size 16mm;  Surgeon: Rexene Alberts, MD;  Location: [place];  Service: Open Heart Surgery;  Laterality: N/A;  MINI THORACOTOMY  . CARDIAC CATHETERIZATION  11/10/00  . CATARACT EXTRACTION W/ INTRAOCULAR LENS IMPLANT     RIGHT EYE  . CHOLECYSTECTOMY    . INGUINAL HERNIA REPAIR     (B)  . POLYPECTOMY    . RIGHT/LEFT HEART CATH AND CORONARY ANGIOGRAPHY N/A 10/16/2017   Procedure: RIGHT/LEFT HEART CATH AND CORONARY ANGIOGRAPHY;  Surgeon: [name], MD;  Location: [facility]  INVASIVE CV LAB;  Service: Cardiovascular;  Laterality: N/A;  . TEE WITHOUT CARDIOVERSION N/A 12/17/2017   Procedure: TRANSESOPHAGEAL ECHOCARDIOGRAM (TEE);  Surgeon: Rexene Alberts, MD;  Location: [place];  Service: Open Heart Surgery;  Laterality: N/A;  . TONSILLECTOMY      Current Medications: Current Meds  Medication Sig  . aspirin EC 325 MG EC tablet Take 1 tablet (325 mg total) by  mouth daily.  Marland Kitchen levothyroxine (SYNTHROID, LEVOTHROID) 88 MCG tablet Take 1 tablet (88 mcg total) by mouth daily before breakfast.  . losartan (COZAAR) 25 MG tablet Take 1 tablet (25 mg total) by mouth daily.  . metoprolol tartrate (LOPRESSOR) 50 MG tablet Take 1 tablet (50 mg total) by mouth 2 (two) times daily.  Marland Kitchen oxyCODONE (OXY IR/ROXICODONE) 5 MG immediate release tablet Take 1 tablet (5 mg total) by mouth every 6 (six) hours as needed for severe pain.  . rosuvastatin (CRESTOR) 20 MG tablet Take 1 tablet (20 mg total) by mouth daily.     Allergies:   Atorvastatin and Ezetimibe   Social History   Socioeconomic History  . Marital status: Married    Spouse name: Not on file  . Number of children: 3  . Years of education: Not on file  . Highest education level: Not on file  Occupational History  . Occupation: retired- used to work for [employer]: 05/2008  Social Needs  . Financial resource strain: Not on file  . Food insecurity:    Worry: Not on file    Inability: Not on file  . Transportation needs:    Medical: Not on file    Non-medical: Not on file  Tobacco Use  . Smoking status: Former Smoker    Last attempt to quit: 04/15/1990    Years since quitting: 27.7  . Smokeless tobacco: Never Used  . Tobacco comment: used to smoke 2 ppd  Substance and Sexual Activity  . Alcohol use: No    Comment: no ETOH since the 90s  . Drug use: No  . Sexual activity: Not on file  Lifestyle  . Physical activity:    Days per week: Not on file    Minutes per session: Not on file  . Stress: Not on file  Relationships  . Social connections:    Talks on phone: Not on file    Gets together: Not on file    Attends religious service: Not on file    Active member of club or organization: Not on file    Attends meetings of clubs or organizations: Not on file    Relationship status: Not on file  Other Topics Concern  . Not on file  Social History Narrative   Lives w/ wife   3  children, 7 g-kids               Family History: The patient's family history includes COPD in his father; Coronary artery disease in his brother; Diabetes in his brother and mother; Emphysema in his father; Stroke in his mother. There is no history of Colon cancer, Prostate cancer, Colon polyps, Esophageal cancer, Rectal cancer, or Stomach cancer. ROS:   Please see the history of present illness.    All other systems reviewed and are negative.  EKGs/Labs/Other Studies Reviewed:    The following studies were reviewed today:  EKG:  EKG ordered today.  The ekg ordered today demonstrates sinus rhythm left bundle branch block left atrial enlargement  Recent Labs: 05/06/2017: TSH  1.87 12/30/2017: B Natriuretic Peptide 217.9 12/31/2017: Magnesium 2.3 01/01/2018: ALT 204; BUN 13; Creatinine, Ser 1.40; Hemoglobin 10.7; Platelets 420; Potassium 3.7; Sodium 141  Recent Lipid Panel    Component Value Date/Time   CHOL 142 09/03/2017 1037   TRIG 149.0 09/03/2017 1037   TRIG 115 05/05/2006 0913   HDL 46.90 09/03/2017 1037   CHOLHDL 3 09/03/2017 1037   VLDL 29.8 09/03/2017 1037   LDLCALC 66 09/03/2017 1037   LDLDIRECT 76.5 04/07/2008 1057    Physical Exam:    VS:  BP (!) 144/70   Pulse 86   Ht 5\' 8"  (1.727 m)   Wt 182 lb 6.4 oz (82.7 kg)   SpO2 96%   BMI 27.73 kg/m     Wt Readings from Last 3 Encounters:  01/13/18 182 lb 6.4 oz (82.7 kg)  01/12/18 181 lb 3.2 oz (82.2 kg)  12/30/17 181 lb (82.1 kg)     GEN:  Well nourished, well developed in no acute distress HEENT: Normal NECK: No JVD; No carotid bruits LYMPHATICS: No lymphadenopathy CARDIAC: RRR, no murmurs, rubs, gallops RESPIRATORY:  Clear to auscultation without rales, wheezing or rhonchi  ABDOMEN: Soft, non-tender, non-distended MUSCULOSKELETAL:  No edema; No deformity  SKIN: Warm and dry NEUROLOGIC:  Alert and oriented x 3 PSYCHIATRIC:  Normal affect    Signed, Shirlee More, MD  01/13/2018 4:44 PM    [facility]

## 2018-01-14 ENCOUNTER — Telehealth (HOSPITAL_COMMUNITY): Payer: Self-pay

## 2018-01-14 NOTE — Telephone Encounter (Signed)
Patient returned phone call in regards to Cardiac Rehab. Scheduled orientation on 02/24/18 at 7:30am. Patient will attend the 8:15am exc class. Went over insurance with patient, verbalized understanding. Mailed packet.

## 2018-01-14 NOTE — Telephone Encounter (Signed)
Attempted to call patient in regards to Cardiac Rehab - LM on VM 

## 2018-01-15 ENCOUNTER — Ambulatory Visit: Payer: Medicare Other

## 2018-01-15 DIAGNOSIS — I472 Ventricular tachycardia: Secondary | ICD-10-CM | POA: Diagnosis not present

## 2018-01-15 LAB — BASIC METABOLIC PANEL
BUN/Creatinine Ratio: 13 (ref 10–24)
BUN: 12 mg/dL (ref 8–27)
CALCIUM: 9.2 mg/dL (ref 8.6–10.2)
CO2: 22 mmol/L (ref 20–29)
CREATININE: 0.95 mg/dL (ref 0.76–1.27)
Chloride: 103 mmol/L (ref 96–106)
GFR calc Af Amer: 93 mL/min/{1.73_m2} (ref >59)
GFR calc non Af Amer: 80 mL/min/{1.73_m2} (ref >59)
GLUCOSE: 135 mg/dL — AB (ref 65–99)
Potassium: 4 mmol/L (ref 3.5–5.2)
SODIUM: 143 mmol/L (ref 134–144)

## 2018-01-15 LAB — CBC
HEMATOCRIT: 35.4 % — AB (ref 37.5–51.0)
Hemoglobin: 11.1 g/dL — ABNORMAL LOW (ref 13.0–17.7)
MCH: 29.1 pg (ref 26.6–33.0)
MCHC: 31.4 g/dL — ABNORMAL LOW (ref 31.5–35.7)
MCV: 93 fL (ref 79–97)
PLATELETS: 257 10*3/uL (ref 150–450)
RBC: 3.81 x10E6/uL — ABNORMAL LOW (ref 4.14–5.80)
RDW: 14.6 % (ref 12.3–15.4)
WBC: 3.8 10*3/uL (ref 3.4–10.8)

## 2018-01-21 ENCOUNTER — Telehealth: Payer: Self-pay | Admitting: *Deleted

## 2018-01-21 MED ORDER — TELMISARTAN 20 MG PO TABS: 20.0000 mg | ORAL_TABLET | Freq: Every day | ORAL | 3 refills | Status: DC

## 2018-01-21 MED ORDER — CARVEDILOL 6.25 MG PO TABS: 6.2500 mg | ORAL_TABLET | Freq: Two times a day (BID) | ORAL | 3 refills | Status: DC

## 2018-01-21 NOTE — Telephone Encounter (Signed)
Patient reports that Dr. Roxy Manns restarted his losartan 25 mg daily on 01/12/18. Patient states he has not taken his morning medication yet today including his metoprolol tartrate 50 mg and losartan 25 mg. Advised patient to take his morning medications now and check his blood pressure in one hour and call back with the reading. Informed patient to take his blood pressure twice daily, once in the morning and once in the evening around the same time each day. Advised patient to keep a log of his blood pressures to bring to his next follow up visit with Dr. Bettina Gavia on 02/24/18. Patient verbalized understanding. Patient has no further complaints at this time.

## 2018-01-21 NOTE — Telephone Encounter (Signed)
Left message on patients cell phone to return call.  

## 2018-01-21 NOTE — Addendum Note (Signed)
Addended by: Austin Miles on: 01/21/2018 02:55 PM   Modules accepted: Orders

## 2018-01-21 NOTE — Telephone Encounter (Signed)
Pt phoned this am with blood pressure readings from yesterday and this morning. Yesterday blood pressures were: 172/102 P96, 169/102 P97, 169/95 P88, 176/87 P78 179/101 P82, 174/96 P84. This AM 170/101 P110, and 168/104 P109. Pt stated was just started back on Losartin 25 mg. Wondered if he needed to go up on the dose. Please advise. Pt states he feels fine.

## 2018-01-21 NOTE — Telephone Encounter (Signed)
1. Stop loasrtin, new telmisartin 20 mg day 2. Stop metoprolol, new cardvedilol 6.25 BID

## 2018-01-21 NOTE — Telephone Encounter (Signed)
Pt called back after taking medicine with a BP of 159/92 and pulse of 89.

## 2018-01-21 NOTE — Telephone Encounter (Signed)
Patient advised that Dr. Bettina Gavia recommends he stop taking telmisartan and metoprolol and start telmisartan 20 mg daily along with carvedilol 6.25 mg twice daily. Patient verbalized understanding. Sent prescriptions in to [facility] in [place]. No further questions.

## 2018-01-21 NOTE — Telephone Encounter (Signed)
Please advise 

## 2018-02-19 ENCOUNTER — Telehealth (HOSPITAL_COMMUNITY): Payer: Self-pay | Admitting: Internal Medicine

## 2018-02-19 ENCOUNTER — Telehealth (HOSPITAL_COMMUNITY): Payer: Self-pay

## 2018-02-19 NOTE — Telephone Encounter (Signed)
Thayer Headings spoke with patient in regards to Cardiac Rehab - Patient called to cancel appts due to lower back pain. Patient will call to reschedule.

## 2018-02-20 ENCOUNTER — Telehealth: Payer: Self-pay

## 2018-02-20 ENCOUNTER — Other Ambulatory Visit: Payer: Self-pay

## 2018-02-20 MED ORDER — TELMISARTAN 80 MG PO TABS: 40.0000 mg | ORAL_TABLET | Freq: Every day | ORAL | 0 refills | Status: DC

## 2018-02-20 NOTE — Telephone Encounter (Signed)
Duplicate message, staff message has been sent to Dr Feliz Beam his response.

## 2018-02-20 NOTE — Telephone Encounter (Signed)
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 What is the alternative? PLease advise.

## 2018-02-20 NOTE — Telephone Encounter (Signed)
Patient is currently taking telmisartan 20mg  one tablet daily, but all doses of this medication are on backorder except 80mg .     Per Dr Joya Gaskins verbal order patient was advised to take telmisartan 80mg  0.5 tablet daily.  Rx was sent to pharmacy.    Patient aware of plan and agrees.

## 2018-02-24 ENCOUNTER — Ambulatory Visit (HOSPITAL_COMMUNITY): Payer: Medicare Other

## 2018-02-24 ENCOUNTER — Ambulatory Visit (INDEPENDENT_AMBULATORY_CARE_PROVIDER_SITE_OTHER): Payer: Medicare Other | Admitting: Cardiology

## 2018-02-24 ENCOUNTER — Telehealth: Payer: Self-pay | Admitting: Internal Medicine

## 2018-02-24 VITALS — BP 146/96 | HR 86 | Ht 68.0 in | Wt 176.1 lb

## 2018-02-24 DIAGNOSIS — I10 Essential (primary) hypertension: Secondary | ICD-10-CM

## 2018-02-24 DIAGNOSIS — R42 Dizziness and giddiness: Secondary | ICD-10-CM

## 2018-02-24 DIAGNOSIS — I472 Ventricular tachycardia: Secondary | ICD-10-CM | POA: Diagnosis not present

## 2018-02-24 DIAGNOSIS — D649 Anemia, unspecified: Secondary | ICD-10-CM

## 2018-02-24 DIAGNOSIS — I447 Left bundle-branch block, unspecified: Secondary | ICD-10-CM | POA: Diagnosis not present

## 2018-02-24 DIAGNOSIS — Z953 Presence of xenogenic heart valve: Secondary | ICD-10-CM | POA: Diagnosis not present

## 2018-02-24 DIAGNOSIS — E785 Hyperlipidemia, unspecified: Secondary | ICD-10-CM

## 2018-02-24 DIAGNOSIS — R000 Tachycardia, unspecified: Secondary | ICD-10-CM

## 2018-02-24 DIAGNOSIS — R350 Frequency of micturition: Secondary | ICD-10-CM

## 2018-02-24 MED ORDER — AMLODIPINE BESYLATE 5 MG PO TABS: 5.0000 mg | ORAL_TABLET | Freq: Every day | ORAL | 3 refills | Status: DC

## 2018-02-24 MED ORDER — CARVEDILOL 12.5 MG PO TABS: 12.5000 mg | ORAL_TABLET | Freq: Two times a day (BID) | ORAL | 3 refills | Status: DC

## 2018-02-24 NOTE — Telephone Encounter (Signed)
Called pt and left voicemail pt needs appt for surgical clearance.

## 2018-02-24 NOTE — Telephone Encounter (Signed)
Pt dropped off form for surgical clearance. Pt states the form needs to be completed asap. He states he has a herniated disc and needs surgery. I did explain paperwork usually takes 5-7 business days but I will give the paperwork directly to [facility].

## 2018-02-24 NOTE — Telephone Encounter (Signed)
Needs appt for surgical clearance.

## 2018-02-24 NOTE — Addendum Note (Signed)
Addended by: Austin Miles on: 02/24/2018 11:45 AM   Modules accepted: Orders

## 2018-02-24 NOTE — Telephone Encounter (Signed)
Patient will be in tomorrow at 9:20

## 2018-02-24 NOTE — Patient Instructions (Signed)
Medication Instructions:  Your physician has recommended you make the following change in your medication:   START amlodipine (novasc) 5 mg daily  INCREASE carvedilol (coreg) 12.5 mg twice daily   Labwork: Your physician recommends that you return for lab work today: CMP, CBC, lipid panel.   Testing/Procedures: None  Follow-Up: Your physician recommends that you schedule a follow-up appointment in: 6 weeks.  If you need a refill on your cardiac medications before your next appointment, please call your pharmacy.   Thank you for choosing [facility]! Robyne Peers, RN [phone]   Amlodipine tablets What is this medicine? AMLODIPINE (am LOE di peen) is a calcium-channel blocker. It affects the amount of calcium found in your heart and muscle cells. This relaxes your blood vessels, which can reduce the amount of work the heart has to do. This medicine is used to lower high blood pressure. It is also used to prevent chest pain. This medicine may be used for other purposes; ask your health care provider or pharmacist if you have questions. COMMON BRAND NAME(S): Norvasc What should I tell my health care provider before I take this medicine? They need to know if you have any of these conditions: -heart problems like heart failure or aortic stenosis -liver disease -an unusual or allergic reaction to amlodipine, other medicines, foods, dyes, or preservatives -pregnant or trying to get pregnant -breast-feeding How should I use this medicine? Take this medicine by mouth with a glass of water. Follow the directions on the prescription label. Take your medicine at regular intervals. Do not take more medicine than directed. Talk to your pediatrician regarding the use of this medicine in children. Special care may be needed. This medicine has been used in children as young as 6. Persons over 39 years old may have a stronger reaction to this medicine and need smaller  doses. Overdosage: If you think you have taken too much of this medicine contact a poison control center or emergency room at once. NOTE: This medicine is only for you. Do not share this medicine with others. What if I miss a dose? If you miss a dose, take it as soon as you can. If it is almost time for your next dose, take only that dose. Do not take double or extra doses. What may interact with this medicine? -herbal or dietary supplements -local or general anesthetics -medicines for high blood pressure -medicines for prostate problems -rifampin This list may not describe all possible interactions. Give your health care provider a list of all the medicines, herbs, non-prescription drugs, or dietary supplements you use. Also tell them if you smoke, drink alcohol, or use illegal drugs. Some items may interact with your medicine. What should I watch for while using this medicine? Visit your doctor or health care professional for regular check ups. Check your blood pressure and pulse rate regularly. Ask your health care professional what your blood pressure and pulse rate should be, and when you should contact him or her. This medicine may make you feel confused, dizzy or lightheaded. Do not drive, use machinery, or do anything that needs mental alertness until you know how this medicine affects you. To reduce the risk of dizzy or fainting spells, do not sit or stand up quickly, especially if you are an older patient. Avoid alcoholic drinks; they can make you more dizzy. Do not suddenly stop taking amlodipine. Ask your doctor or health care professional how you can gradually reduce the dose. What side effects may I  notice from receiving this medicine? Side effects that you should report to your doctor or health care professional as soon as possible: -allergic reactions like skin rash, itching or hives, swelling of the face, lips, or tongue -breathing problems -changes in vision or hearing -chest  pain -fast, irregular heartbeat -swelling of legs or ankles Side effects that usually do not require medical attention (report to your doctor or health care professional if they continue or are bothersome): -dry mouth -facial flushing -nausea, vomiting -stomach gas, pain -tired, weak -trouble sleeping This list may not describe all possible side effects. Call your doctor for medical advice about side effects. You may report side effects to FDA at 1-800-FDA-1088. Where should I keep my medicine? Keep out of the reach of children. Store at room temperature between 59 and 86 degrees F (15 and 30 degrees C). Protect from light. Keep container tightly closed. Throw away any unused medicine after the expiration date. NOTE: This sheet is a summary. It may not cover all possible information. If you have questions about this medicine, talk to your doctor, pharmacist, or health care provider.  2018 Elsevier/Gold Standard (2012-04-24 11:40:58)

## 2018-02-24 NOTE — Progress Notes (Signed)
Cardiology Office Note:    Date:  02/24/2018   ID:  Eric [name], DOB 09/22/1945, MRN [id number]  PCP:  Colon Branch, MD  Cardiologist:  Shirlee More, MD    Referring MD: Colon Branch, MD    ASSESSMENT:    1. 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 Left bundle branch block (LBBB)   3. Essential hypertension   4. Wide-complex tachycardia ([place])   5. Hyperlipidemia, unspecified hyperlipidemia type   6. Dizziness   7. Anemia, unspecified type    PLAN:    In order of problems listed above:  1. Stable he is recovered 2. Stable pattern on EKGs 3. Unfortunately not well controlled likely pain is a significant issue we will increase his beta-blocker add calcium channel blocker and recheck labs today 4. Recurrence resolved 5. Normal labs with hemoglobin at 10 elevated creatinine abnormal liver function recheck those today.   Next appointment: 6 weeks for blood pressure control   Medication Adjustments/Labs and Tests Ordered: Current medicines are reviewed at length with the patient today.  Concerns regarding medicines are outlined above.  Orders Placed This Encounter  Procedures  . Comprehensive Metabolic Panel (CMET)  . CBC  . Lipid Profile   Meds ordered this encounter  Medications  . carvedilol (COREG) 12.5 MG tablet    Sig: Take 1 tablet (12.5 mg total) by mouth 2 (two) times daily with a meal.    Dispense:  60 tablet    Refill:  3  . amLODipine (NORVASC) 5 MG tablet    Sig: Take 1 tablet (5 mg total) by mouth daily.    Dispense:  30 tablet    Refill:  3    Chief Complaint  Patient presents with  . Aortic Stenosis  . Hypertension  . Hyperlipidemia    History of Present Illness:    Eric [name] is a 72 y.o. male with a hx of aortic stenosis and AVR last seen 01/13/18. He underwent minimally invasive aortic valve replacement with bioprosthesis 12/17/2017. He had a readmission to the hospital 12/30/2017 discharge  01/01/2018 with wide-complex tachycardia with cardioversion prior to hospitalization and hypotension secondary to medications.  Echocardiogram during that admission showed ejection fraction of 50 to 55% and normal function of the pericardial tissue valve.   Compliance with diet, lifestyle and medications: yes  Is not doing well his predominant problem is severe low back and radicular pain he has had an MRI I cannot access the report any seeing neurosurgery this afternoon.  I told him if surgery is required I think he is an acceptable candidate from the perspective of cardiology.  Blood pressure at home in general is running 1 59-1 60 systolic and at times up to 190.  Note he continues to be in severe pain.  No palpitations syncope chest pain or shortness of breath.  Review shows a hemoglobin of 10 mildly elevated creatinine and abnormal liver function in July and I will recheck those labs today.  [place] blood pressure control add a calcium channel blocker keep on a higher dose of ARB and increase his beta-blocker dose. Past Medical History:  Diagnosis Date  . Abnormal LFTs   . Anxiety   . Aortic stenosis 04/15/2011  . Dizziness 04/21/2014  . Dyspnea   . Heart murmur   . Hyperglycemia 10/20/2014  . Hyperlipidemia   . Hypertension   . Hypothyroidism   . Left bundle branch block (LBBB) 12/18/2017  . Macular edema  Dr  Sherlynn Stalls   . 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 AORTIC VALVE REPLACEMENT N/A 12/17/2017   Procedure: MINIMALLY INVASIVE AORTIC VALVE REPLACEMENT (AVR) [ using Edwards Intuity Valve Size 82mm;  Surgeon: Rexene Alberts, MD;  Location: [place];  Service: Open Heart Surgery;  Laterality: N/A;  MINI THORACOTOMY  . CARDIAC CATHETERIZATION  11/10/00  . CATARACT  EXTRACTION W/ INTRAOCULAR LENS IMPLANT     RIGHT EYE  . CHOLECYSTECTOMY    . INGUINAL HERNIA REPAIR     (B)  . POLYPECTOMY    . RIGHT/LEFT HEART CATH AND CORONARY ANGIOGRAPHY N/A 10/16/2017   Procedure: RIGHT/LEFT HEART CATH AND CORONARY ANGIOGRAPHY;  Surgeon: [name], MD;  Location: [facility];  Service: Cardiovascular;  Laterality: N/A;  . TEE WITHOUT CARDIOVERSION N/A 12/17/2017   Procedure: TRANSESOPHAGEAL ECHOCARDIOGRAM (TEE);  Surgeon: Rexene Alberts, MD;  Location: [place];  Service: Open Heart Surgery;  Laterality: N/A;  . TONSILLECTOMY      Current Medications: Current Meds  Medication Sig  . aspirin EC 325 MG EC tablet Take 1 tablet (325 mg total) by mouth daily.  . carvedilol (COREG) 12.5 MG tablet Take 1 tablet (12.5 mg total) by mouth 2 (two) times daily with a meal.  . HYDROcodone-acetaminophen (NORCO/VICODIN) 5-325 MG tablet Take 1 tablet by mouth every 8 (eight) hours as needed.  Marland Kitchen ibuprofen (ADVIL,MOTRIN) 200 MG tablet Take 200 mg by mouth every 8 (eight) hours as needed.  Marland Kitchen levothyroxine (SYNTHROID, LEVOTHROID) 88 MCG tablet Take 1 tablet (88 mcg total) by mouth daily before breakfast.  . rosuvastatin (CRESTOR) 20 MG tablet Take 1 tablet (20 mg total) by mouth daily.  Marland Kitchen telmisartan (MICARDIS) 80 MG tablet Take 0.5 tablets (40 mg total) by mouth daily.  . [DISCONTINUED] carvedilol (COREG) 6.25 MG tablet Take 1 tablet (6.25 mg total) by mouth 2 (two) times daily with a meal.     Allergies:   Atorvastatin and Ezetimibe   Social History   Socioeconomic History  . Marital status: Married    Spouse name: Not on file  . Number of children: 3  . Years of education: Not on file  . Highest education level: Not on file  Occupational History  . Occupation: retired- used to work for [employer]: 05/2008  Social Needs  . Financial resource strain: Not on file  . Food insecurity:    Worry: Not on file    Inability: Not on file  . Transportation  needs:    Medical: Not on file    Non-medical: Not on file  Tobacco Use  . Smoking status: Former Smoker    Last attempt to quit: 04/15/1990    Years since quitting: 27.8  . Smokeless tobacco: Never Used  . Tobacco comment: used to smoke 2 ppd  Substance and Sexual Activity  . Alcohol use: No    Comment: no ETOH since the 90s  . Drug use: No  . Sexual activity: Not on file  Lifestyle  . Physical activity:    Days per week: Not on file    Minutes per session: Not on file  . Stress: Not on file  Relationships  . Social connections:    Talks on phone: Not on file    Gets together: Not  on file    Attends religious service: Not on file    Active member of club or organization: Not on file    Attends meetings of clubs or organizations: Not on file    Relationship status: Not on file  Other Topics Concern  . Not on file  Social History Narrative   Lives w/ wife   3 children, 7 g-kids               Family History: The patient's family history includes COPD in his father; Coronary artery disease in his brother; Diabetes in his brother and mother; Emphysema in his father; Stroke in his mother. There is no history of Colon cancer, Prostate cancer, Colon polyps, Esophageal cancer, Rectal cancer, or Stomach cancer. ROS:   Please see the history of present illness.    All other systems reviewed and are negative.  EKGs/Labs/Other Studies Reviewed:    The following studies were reviewed today:   Event monitor 01/15/18 to 01/29/18 : Conclusion, left bundle branch block occasional APCs no sustained supraventricular or ventricular tachycardia noted Recent Labs: 05/06/2017: TSH 1.87 12/30/2017: B Natriuretic Peptide 217.9 12/31/2017: Magnesium 2.3 01/01/2018: ALT 204 01/14/2018: BUN 12; Creatinine, Ser 0.95; Hemoglobin 11.1; Platelets 257; Potassium 4.0; Sodium 143  Recent Lipid Panel    Component Value Date/Time   CHOL 142 09/03/2017 1037   TRIG 149.0 09/03/2017 1037   TRIG 115  05/05/2006 0913   HDL 46.90 09/03/2017 1037   CHOLHDL 3 09/03/2017 1037   VLDL 29.8 09/03/2017 1037   LDLCALC 66 09/03/2017 1037   LDLDIRECT 76.5 04/07/2008 1057    Physical Exam:    VS:  BP (!) 146/96 (BP Location: Right Arm, Patient Position: Sitting, Cuff Size: Normal)   Pulse 86   Ht 5\' 8"  (1.727 m)   Wt 176 lb 1.9 oz (79.9 kg)   SpO2 97%   BMI 26.78 kg/m     Wt Readings from Last 3 Encounters:  02/24/18 176 lb 1.9 oz (79.9 kg)  01/13/18 182 lb 6.4 oz (82.7 kg)  01/12/18 181 lb 3.2 oz (82.2 kg)     GEN:  Well nourished, well developed in no acute distress HEENT: Normal NECK: No JVD; No carotid bruits LYMPHATICS: No lymphadenopathy CARDIAC: RRR, no murmurs, rubs, gallops RESPIRATORY:  Clear to auscultation without rales, wheezing or rhonchi  ABDOMEN: Soft, non-tender, non-distended MUSCULOSKELETAL:  No edema; No deformity  SKIN: Warm and dry NEUROLOGIC:  Alert and oriented x 3 PSYCHIATRIC:  Normal affect    Signed, Shirlee More, MD  02/24/2018 11:35 AM    [place]

## 2018-02-25 ENCOUNTER — Encounter: Payer: Self-pay | Admitting: Internal Medicine

## 2018-02-25 ENCOUNTER — Ambulatory Visit (INDEPENDENT_AMBULATORY_CARE_PROVIDER_SITE_OTHER): Payer: Medicare Other | Admitting: Internal Medicine

## 2018-02-25 VITALS — BP 142/80 | HR 80 | Temp 98.1°F | Resp 16 | Ht 68.0 in | Wt 179.2 lb

## 2018-02-25 DIAGNOSIS — Z01818 Encounter for other preprocedural examination: Secondary | ICD-10-CM | POA: Diagnosis not present

## 2018-02-25 DIAGNOSIS — I10 Essential (primary) hypertension: Secondary | ICD-10-CM

## 2018-02-25 DIAGNOSIS — R739 Hyperglycemia, unspecified: Secondary | ICD-10-CM

## 2018-02-25 DIAGNOSIS — I350 Nonrheumatic aortic (valve) stenosis: Secondary | ICD-10-CM

## 2018-02-25 DIAGNOSIS — E038 Other specified hypothyroidism: Secondary | ICD-10-CM

## 2018-02-25 DIAGNOSIS — R399 Unspecified symptoms and signs involving the genitourinary system: Secondary | ICD-10-CM | POA: Diagnosis not present

## 2018-02-25 LAB — URINALYSIS
Bilirubin, UA: NEGATIVE
Glucose, UA: NEGATIVE
KETONES UA: NEGATIVE
Leukocytes, UA: NEGATIVE
Nitrite, UA: NEGATIVE
PH UA: 5.5 (ref 5.0–7.5)
PROTEIN UA: NEGATIVE
SPEC GRAV UA: 1.007 (ref 1.005–1.030)
Urobilinogen, Ur: 0.2 mg/dL (ref 0.2–1.0)

## 2018-02-25 LAB — CBC
Hematocrit: 41.8 % (ref 37.5–51.0)
Hemoglobin: 13.3 g/dL (ref 13.0–17.7)
MCH: 27.9 pg (ref 26.6–33.0)
MCHC: 31.8 g/dL (ref 31.5–35.7)
MCV: 88 fL (ref 79–97)
Platelets: 233 10*3/uL (ref 150–450)
RBC: 4.76 x10E6/uL (ref 4.14–5.80)
RDW: 15.2 % (ref 12.3–15.4)
WBC: 5.9 10*3/uL (ref 3.4–10.8)

## 2018-02-25 LAB — LIPID PANEL
CHOLESTEROL TOTAL: 172 mg/dL (ref 100–199)
Chol/HDL Ratio: 2.8 ratio (ref 0.0–5.0)
HDL: 61 mg/dL (ref >39)
LDL Calculated: 84 mg/dL (ref 0–99)
Triglycerides: 135 mg/dL (ref 0–149)
VLDL CHOLESTEROL CAL: 27 mg/dL (ref 5–40)

## 2018-02-25 LAB — COMPREHENSIVE METABOLIC PANEL
ALT: 31 IU/L (ref 0–44)
AST: 34 IU/L (ref 0–40)
Albumin/Globulin Ratio: 1.8 (ref 1.2–2.2)
Albumin: 4.6 g/dL (ref 3.5–4.8)
Alkaline Phosphatase: 69 IU/L (ref 39–117)
BUN/Creatinine Ratio: 14 (ref 10–24)
BUN: 15 mg/dL (ref 8–27)
Bilirubin Total: 0.6 mg/dL (ref 0.0–1.2)
CO2: 27 mmol/L (ref 20–29)
Calcium: 9.8 mg/dL (ref 8.6–10.2)
Chloride: 99 mmol/L (ref 96–106)
Creatinine, Ser: 1.04 mg/dL (ref 0.76–1.27)
GFR calc Af Amer: 83 mL/min/{1.73_m2} (ref >59)
GFR calc non Af Amer: 71 mL/min/{1.73_m2} (ref >59)
Globulin, Total: 2.5 g/dL (ref 1.5–4.5)
Glucose: 94 mg/dL (ref 65–99)
Potassium: 4.7 mmol/L (ref 3.5–5.2)
Sodium: 140 mmol/L (ref 134–144)
Total Protein: 7.1 g/dL (ref 6.0–8.5)

## 2018-02-25 LAB — TSH: TSH: 3.93 u[IU]/mL (ref 0.35–4.50)

## 2018-02-25 LAB — PSA: PSA: 2.32 ng/mL (ref 0.10–4.00)

## 2018-02-25 NOTE — Telephone Encounter (Signed)
Form completed- PCP recommends postponing surgery until cardio clears Pt. Form faxed to [facility] at [phone]. Form sent for scanning.

## 2018-02-25 NOTE — Patient Instructions (Signed)
GO TO THE LAB : Get the blood work     GO TO THE FRONT DESK Schedule your next appointment for a   checkup in 4 months  

## 2018-02-25 NOTE — Assessment & Plan Note (Signed)
HTN: Not well controlled, saw cardiology  today, labs were okay, he is on Micardis, metoprolol was switched to carvedilol and started amlodipine.  To follow-up with cardiology as recommended in 6 weeks. Hyperlipidemia: On Crestor, last FLP reviewed and satisfactory Hypothyroidism: Check a TSH.  Good medication compliance. Aortic stenosis: Status post surgery, seems to be doing great. LUTS: DRE normal, UA yesterday negative, check a PSA, conservative treatment.  Patient to call if symptoms not better, flomax?. Back pain: Started around 01/08/2018, has been evaluated by orthopedic surgery, had a MRI yesterday.  They will try a local injection first but the patient reports that he is very likely he will need surgery.  If that is the case, will need a clearance from cardiology. RTC 4 months, sooner if needed.

## 2018-02-25 NOTE — Progress Notes (Signed)
Pre visit review using our clinic review tool, if applicable. No additional management support is needed unless otherwise documented below in the visit note. 

## 2018-02-25 NOTE — Progress Notes (Signed)
Subjective:    Patient ID: Eric [name], male    DOB: 07/23/1945, 72 y.o.   MRN: [id number]  DOS:  02/25/2018 Type of visit - description : Follow-up Interval history: Since the last office visit, he had heart surgery.  Chart reviewed HTN: BP was elevated today, saw cardiology, medications changed. LUTS: Started with problems with urinary frequency few weeks ago. Some nocturia as well. Hypothyroidism: Due for a TSH. Severe low back pain started approximately 01/08/2018.  Evaluated by surgery.  Review of Systems Denies any dysuria, gross hematuria or difficulty with a urinary flow.  Is just frequency. Denies chest pain or difficulty breathing Occasionally lower extremity edema   Past Medical History:  Diagnosis Date  . Abnormal LFTs   . Anxiety   . Aortic stenosis 04/15/2011  . Dizziness 04/21/2014  . Dyspnea   . Heart murmur   . Hyperglycemia 10/20/2014  . Hyperlipidemia   . Hypertension   . Hypothyroidism   . Left bundle branch block (LBBB) 12/18/2017  . Macular edema    Dr  Sherlynn Stalls   . 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 AORTIC VALVE REPLACEMENT N/A 12/17/2017   Procedure: MINIMALLY INVASIVE AORTIC VALVE REPLACEMENT (AVR) [ using Edwards Intuity Valve Size 84mm;  Surgeon: Rexene Alberts, MD;  Location: [facility];  Service: Open Heart Surgery;  Laterality: N/A;  MINI THORACOTOMY  . CARDIAC CATHETERIZATION  11/10/00  . CATARACT EXTRACTION W/ INTRAOCULAR LENS IMPLANT     RIGHT EYE  . CHOLECYSTECTOMY    . INGUINAL HERNIA REPAIR     (B)  . POLYPECTOMY    . RIGHT/LEFT HEART CATH AND CORONARY ANGIOGRAPHY N/A 10/16/2017   Procedure: RIGHT/LEFT HEART CATH AND CORONARY ANGIOGRAPHY;  Surgeon: [name], MD;  Location: [facility];  Service:  Cardiovascular;  Laterality: N/A;  . TEE WITHOUT CARDIOVERSION N/A 12/17/2017   Procedure: TRANSESOPHAGEAL ECHOCARDIOGRAM (TEE);  Surgeon: Rexene Alberts, MD;  Location: [facility];  Service: Open Heart Surgery;  Laterality: N/A;  . TONSILLECTOMY      Social History   Socioeconomic History  . Marital status: Married    Spouse name: Not on file  . Number of children: 3  . Years of education: Not on file  . Highest education level: Not on file  Occupational History  . Occupation: retired- used to work for [employer]: 05/2008  Social Needs  . Financial resource strain: Not on file  . Food insecurity:    Worry: Not on file    Inability: Not on file  . Transportation needs:    Medical: Not on file    Non-medical: Not on file  Tobacco Use  . Smoking status: Former Smoker    Last attempt to quit: 04/15/1990    Years since quitting: 27.8  . Smokeless tobacco: Never Used  . Tobacco comment: used to smoke 2 ppd  Substance and Sexual Activity  . Alcohol use: No    Comment: no ETOH since the 90s  . Drug use: No  . Sexual activity: Not on file  Lifestyle  . Physical activity:    Days per week: Not on file    Minutes per session: Not on file  . Stress: Not on file  Relationships  . Social connections:    Talks  on phone: Not on file    Gets together: Not on file    Attends religious service: Not on file    Active member of club or organization: Not on file    Attends meetings of clubs or organizations: Not on file    Relationship status: Not on file  . Intimate partner violence:    Fear of current or ex partner: Not on file    Emotionally abused: Not on file    Physically abused: Not on file    Forced sexual activity: Not on file  Other Topics Concern  . Not on file  Social History Narrative   Lives w/ wife   3 children, 7 g-kids                Allergies as of 02/25/2018      Reactions   Atorvastatin Other (See Comments)   REACTION: myalgia   Ezetimibe Other  (See Comments)   REACTION: myalgia      Medication List        Accurate as of 02/25/18  7:25 PM. Always use your most recent med list.          amLODipine 5 MG tablet Commonly known as:  NORVASC Take 1 tablet (5 mg total) by mouth daily.   aspirin 325 MG EC tablet Take 1 tablet (325 mg total) by mouth daily.   carvedilol 12.5 MG tablet Commonly known as:  COREG Take 1 tablet (12.5 mg total) by mouth 2 (two) times daily with a meal.   HYDROcodone-acetaminophen 5-325 MG tablet Commonly known as:  NORCO/VICODIN Take 1 tablet by mouth every 8 (eight) hours as needed.   ibuprofen 200 MG tablet Commonly known as:  ADVIL,MOTRIN Take 200 mg by mouth every 8 (eight) hours as needed.   levothyroxine 88 MCG tablet Commonly known as:  SYNTHROID, LEVOTHROID Take 1 tablet (88 mcg total) by mouth daily before breakfast.   rosuvastatin 20 MG tablet Commonly known as:  CRESTOR Take 1 tablet (20 mg total) by mouth daily.   telmisartan 80 MG tablet Commonly known as:  MICARDIS Take 0.5 tablets (40 mg total) by mouth daily.          Objective:   Physical Exam BP (!) 142/80 (BP Location: Left Arm, Patient Position: Sitting, Cuff Size: Small)   Pulse 80   Temp 98.1 F (36.7 C) (Oral)   Resp 16   Ht 5\' 8"  (1.727 m)   Wt 179 lb 4 oz (81.3 kg)   SpO2 97%   BMI 27.25 kg/m  General:   Well developed, NAD, see BMI.  HEENT:  Normocephalic . Face symmetric, atraumatic Lungs:  CTA B Normal respiratory effort, no intercostal retractions, no accessory muscle use. Heart: RRR,  no murmur.  No pretibial edema bilaterally  Rectal: External abnormalities: none. Normal sphincter tone. No rectal masses or tenderness.  Brown stools Prostate: Prostate gland firm and smooth, no enlargement, nodularity, tenderness, mass, asymmetry or induration Skin: Not pale. Not jaundice Neurologic:  alert & oriented X3.  Speech normal, gait appropriate for age and unassisted Psych--  Cognition and  judgment appear intact.  Cooperative with normal attention span and concentration.  Behavior appropriate. No anxious or depressed appearing.      Assessment & Plan:    Assessment HTN Hyperlipidemia Hypothyroidism Increase LFTs Ultrasound 2002 done for increased LFTs: Normal liver; Hep C negative 02-16-15 Aortic stenosis , dx  2012 , s/p AoVR 12/2017 Macular degeneration, cataracts  +FH CAD Brother age  72s  PLAN HTN: Not well controlled, saw cardiology  today, labs were okay, he is on Micardis, metoprolol was switched to carvedilol and started amlodipine.  To follow-up with cardiology as recommended in 6 weeks. Hyperlipidemia: On Crestor, last FLP reviewed and satisfactory Hypothyroidism: Check a TSH.  Good medication compliance. Aortic stenosis: Status post surgery, seems to be doing great. LUTS: DRE normal, UA yesterday negative, check a PSA, conservative treatment.  Patient to call if symptoms not better, flomax?. Back pain: Started around 01/08/2018, has been evaluated by orthopedic surgery, had a MRI yesterday.  They will try a local injection first but the patient reports that he is very likely he will need surgery.  If that is the case, will need a clearance from cardiology. RTC 4 months, sooner if needed.

## 2018-03-02 ENCOUNTER — Ambulatory Visit (HOSPITAL_COMMUNITY): Payer: Medicare Other

## 2018-03-03 DIAGNOSIS — M5126 Other intervertebral disc displacement, lumbar region: Secondary | ICD-10-CM

## 2018-03-03 HISTORY — DX: Other intervertebral disc displacement, lumbar region: M51.26

## 2018-03-04 ENCOUNTER — Ambulatory Visit (HOSPITAL_COMMUNITY): Payer: Medicare Other

## 2018-03-06 ENCOUNTER — Ambulatory Visit (HOSPITAL_COMMUNITY): Payer: Medicare Other

## 2018-03-09 ENCOUNTER — Ambulatory Visit (HOSPITAL_COMMUNITY): Payer: Medicare Other

## 2018-03-11 ENCOUNTER — Ambulatory Visit (HOSPITAL_COMMUNITY): Payer: Medicare Other

## 2018-03-13 ENCOUNTER — Ambulatory Visit (HOSPITAL_COMMUNITY): Payer: Medicare Other

## 2018-03-16 ENCOUNTER — Ambulatory Visit (HOSPITAL_COMMUNITY): Payer: Medicare Other

## 2018-03-16 ENCOUNTER — Ambulatory Visit (INDEPENDENT_AMBULATORY_CARE_PROVIDER_SITE_OTHER): Payer: Medicare Other

## 2018-03-16 DIAGNOSIS — Z23 Encounter for immunization: Secondary | ICD-10-CM | POA: Diagnosis not present

## 2018-03-18 ENCOUNTER — Ambulatory Visit (HOSPITAL_COMMUNITY): Payer: Medicare Other

## 2018-03-20 ENCOUNTER — Ambulatory Visit (HOSPITAL_COMMUNITY): Payer: Medicare Other

## 2018-03-23 ENCOUNTER — Ambulatory Visit (HOSPITAL_COMMUNITY): Payer: Medicare Other

## 2018-03-23 ENCOUNTER — Ambulatory Visit (INDEPENDENT_AMBULATORY_CARE_PROVIDER_SITE_OTHER): Payer: Medicare Other | Admitting: Thoracic Surgery (Cardiothoracic Vascular Surgery)

## 2018-03-23 ENCOUNTER — Encounter: Payer: Self-pay | Admitting: Thoracic Surgery (Cardiothoracic Vascular Surgery)

## 2018-03-23 ENCOUNTER — Other Ambulatory Visit: Payer: Self-pay

## 2018-03-23 VITALS — BP 128/70 | HR 82 | Resp 18 | Ht 68.0 in | Wt 181.8 lb

## 2018-03-23 DIAGNOSIS — Z953 Presence of xenogenic heart valve: Secondary | ICD-10-CM | POA: Diagnosis not present

## 2018-03-23 NOTE — Patient Instructions (Signed)

## 2018-03-23 NOTE — Progress Notes (Signed)
[address]       [place] [address]             [phone]     CARDIOTHORACIC SURGERY OFFICE NOTE  Referring Provider is [name], MD PCP is Colon Branch, MD   HPI:  Patient is a 72 year old male with history of aortic stenosis, hypertension, hyperlipidemia, and hypothyroidism who returns to the office today for routine follow-up status post aortic valve replacement using a rapid deployment stented bovine pericardial tissue valve on December 17, 2017 for severe symptomatic aortic stenosis.  The patient's early postoperative recovery was uneventful, although he was seen in the emergency department by Dr. Prescott Gum on December 27, 2017 after a sudden onset of spontaneous drainage of serosanguineous fluid from his right chest via previous chest tube insertion site.  The patient was instructed to restart taking losartan at that time and he was given a prescription for Lasix.  After that the patient reported feeling very tired and weak.  On December 31, 2017 the patient woke up feeling very poor and his blood pressure was low.  EMS was called and on arrival the patient was notably tachycardic in a wide-complex rhythm with heart rate in 170s, blood pressure 60s and 70s.  He was cardioverted to sinus rhythm with 100 J and his dizziness immediately improved.    He was hospitalized for 48 hours and follow-up echocardiogram revealed normal left ventricular function with normal function bioprosthetic tissue valve in aortic position.  There was no pericardial effusion.  Blood pressure medications were stopped and the patient was ultimately discharged home.  He was last seen in follow-up in our office on January 12, 2018 at which time he was doing well.  Since then he has been followed carefully both by Dr. Bettina Gavia and by Dr. Larose Kells.  He has had considerable problems with back pain related to degenerative disc disease, and he is being followed by Dr. Maxie Better for possible need to proceed with discectomy  for treatment of HNP.  He returns to our office today for routine follow-up.  He reports that overall he is doing very well from a cardiac standpoint.  He no longer has any soreness in his chest.  He denies any shortness of breath.  His activity level is pretty good.  He does state that last week he had an episode where he got dizzy and fell down.  At the time he was outdoors leaning against a fence when he became lightheaded.  He did not black out.  He has not had associated chest pain or chest tightness.  He has not had palpitations.   Current Outpatient Medications  Medication Sig Dispense Refill  . amLODipine (NORVASC) 5 MG tablet Take 1 tablet (5 mg total) by mouth daily. 30 tablet 3  . aspirin EC 325 MG EC tablet Take 1 tablet (325 mg total) by mouth daily. 30 tablet 0  . carvedilol (COREG) 12.5 MG tablet Take 1 tablet (12.5 mg total) by mouth 2 (two) times daily with a meal. 60 tablet 3  . HYDROcodone-acetaminophen (NORCO/VICODIN) 5-325 MG tablet Take 1 tablet by mouth every 8 (eight) hours as needed.  0  . ibuprofen (ADVIL,MOTRIN) 200 MG tablet Take 200 mg by mouth every 8 (eight) hours as needed.    Marland Kitchen levothyroxine (SYNTHROID, LEVOTHROID) 88 MCG tablet Take 1 tablet (88 mcg total) by mouth daily before breakfast. 30 tablet 5  . rosuvastatin (CRESTOR) 20 MG tablet Take  1 tablet (20 mg total) by mouth daily. 30 tablet 5  . telmisartan (MICARDIS) 80 MG tablet Take 0.5 tablets (40 mg total) by mouth daily. 15 tablet 0   No current facility-administered medications for this visit.       Physical Exam:   BP 128/70 (BP Location: Left Arm, Patient Position: Sitting, Cuff Size: Normal)   Pulse 82   Resp 18   Ht 5\' 8"  (1.727 m)   Wt 181 lb 12.8 oz (82.5 kg)   SpO2 98% Comment: ra  BMI 27.64 kg/m   General:  Well-appearing  Chest:   Clear to auscultation  CV:   Regular rate and rhythm without murmur  Incisions:  Completely healed  Abdomen:  Soft nontender  Extremities:  Warm and  well-perfused  Diagnostic Tests:  Transthoracic Echocardiography  Patient:    Eric [name], Eric [name] MR #:       [id number] Study Date: 12/31/2017 Gender:     M Age:        38 Height:     175.3 cm Weight:     82.1 kg BSA:        2.01 m^2 Pt. Status: Room:       [facility]   Andrena Mews, M.D.  REFERRING    Darylene Price, M.D.  ATTENDING    Julianne Rice [id number]  PERFORMING   [facility], Inpatient  ADMITTING    Kayleen Memos  SONOGRAPHER  Madelaine Etienne  cc:  ------------------------------------------------------------------- LV EF: 50% -   55%  ------------------------------------------------------------------- Indications:      Aortic stenosis 424.1.  ------------------------------------------------------------------- Study Conclusions  - Left ventricle: Abnormal septal motion Systolic function was   normal. The estimated ejection fraction was in the range of 50%   to 55%. - Aortic valve: Intuity Elite pericardial tissue valve. Normal   appearing prosthetic AVR with no peri valvular regurgitation   Valve area (VTI): 1.4 cm^2. Valve area (Vmean): 1.55 cm^2. - Mitral valve: Calcified annulus. Mildly thickened leaflets . - Left atrium: The atrium was mildly dilated. - Right atrium: The atrium was mildly dilated. - Atrial septum: No defect or patent foramen ovale was identified. - Pulmonic valve: Peak gradient (S): 11 mm Hg.  ------------------------------------------------------------------- Study data:  Comparison was made to the study of 09/08/2017.  Study status:  Routine.  Procedure:  Transthoracic echocardiography. Image quality was adequate.  Study completion:  There were no complications.          Transthoracic echocardiography.  M-mode, complete 2D, spectral Doppler, and color Doppler.  Birthdate: Patient birthdate: 14-Mar-1946.  Age:  Patient is 72 yr old.  Sex: Gender: male.    BMI: 26.7 kg/m^2.  Blood pressure:     118/68 Patient status:   Inpatient.  Study date:  Study date: 12/31/2017. Study time: 08:23 AM.  Location:  ICU/CCU  -------------------------------------------------------------------  ------------------------------------------------------------------- Left ventricle:  Abnormal septal motion  Wall thickness was normal.   Systolic function was normal. The estimated ejection fraction was in the range of 50% to 55%.  ------------------------------------------------------------------- Aortic valve:  Intuity Elite pericardial tissue valve. Normal appearing prosthetic AVR with no peri valvular regurgitation Doppler:     VTI ratio of LVOT to aortic valve: 0.45. Valve area (VTI): 1.4 cm^2. Indexed valve area (VTI): 0.7 cm^2/m^2. Mean velocity ratio of LVOT to aortic valve: 0.49. Valve area (Vmean): 1.55 cm^2. Indexed valve area (Vmean): 0.77 cm^2/m^2.    Mean gradient (S): 9 mm Hg.  ------------------------------------------------------------------- Aorta:  The aorta was  normal, not dilated, and non-diseased.  ------------------------------------------------------------------- Mitral valve:   Calcified annulus. Mildly thickened leaflets . Doppler:  There was no significant regurgitation.    Peak gradient (D): 3 mm Hg.  ------------------------------------------------------------------- Left atrium:  The atrium was mildly dilated.  ------------------------------------------------------------------- Atrial septum:  No defect or patent foramen ovale was identified.   ------------------------------------------------------------------- Pulmonic valve:    Doppler:  There was mild regurgitation. Peak gradient (S): 11 mm Hg.  ------------------------------------------------------------------- Tricuspid valve:   Doppler:  There was mild regurgitation.  ------------------------------------------------------------------- Right atrium:  The atrium was mildly  dilated.  ------------------------------------------------------------------- Pericardium:  The pericardium was normal in appearance.  ------------------------------------------------------------------- Systemic veins: Inferior vena cava: The vessel was normal in size. The respirophasic diameter changes were in the normal range (>= 50%), consistent with normal central venous pressure.  ------------------------------------------------------------------- Post procedure conclusions Ascending Aorta:  - The aorta was normal, not dilated, and non-diseased.  ------------------------------------------------------------------- Measurements   Left ventricle                            Value          Reference  LV ID, ED, PLAX chordal            (L)    39.5  mm       43 - 52  LV ID, ES, PLAX chordal                   29.2  mm       23 - 38  LV fx shortening, PLAX chordal     (L)    26    %        >=29  LV PW thickness, ED                       7.56  mm       ---------  IVS/LV PW ratio, ED                (H)    1.43           <=1.3  Stroke volume, 2D                         57    ml       ---------  Stroke volume/bsa, 2D                     28    ml/m^2   ---------  LV ejection fraction, 1-p A4C             42    %        ---------  LV end-diastolic volume, 2-p              121   ml       ---------  LV end-systolic volume, 2-p               60    ml       ---------  LV ejection fraction, 2-p                 50    %        ---------  Stroke volume, 2-p                        61    ml       ---------  LV end-diastolic volume/bsa, 2-p          60    ml/m^2   ---------  LV end-systolic volume/bsa, 2-p           30    ml/m^2   ---------  Stroke volume/bsa, 2-p                    30.2  ml/m^2   ---------  LV e&', lateral                            7.62  cm/s     ---------  LV E/e&', lateral                          10.56          ---------  LV s&', lateral                            8.38   cm/s     ---------  LV e&', medial                             4.9   cm/s     ---------  LV E/e&', medial                           16.43          ---------  LV e&', average                            6.26  cm/s     ---------  LV E/e&', average                          12.86          ---------  Longitudinal strain, TDI                  14    %        ---------    Ventricular septum                        Value          Reference  IVS thickness, ED                         10.8  mm       ---------    LVOT                                      Value          Reference  LVOT ID, S                                20    mm       ---------  LVOT area                                 3.14  cm^2     ---------  LVOT mean velocity, S                     69.2  cm/s     ---------  LVOT VTI, S                               18.1  cm       ---------    Aortic valve                              Value          Reference  Aortic valve mean velocity, S             140   cm/s     ---------  Aortic valve VTI, S                       40.5  cm       ---------  Aortic mean gradient, S                   9     mm Hg    ---------  VTI ratio, LVOT/AV                        0.45           ---------  Aortic valve area, VTI                    1.4   cm^2     ---------  Aortic valve area/bsa, VTI                0.7   cm^2/m^2 ---------  Velocity ratio, mean, LVOT/AV             0.49           ---------  Aortic valve area, mean velocity          1.55  cm^2     ---------  Aortic valve area/bsa, mean               0.77  cm^2/m^2 ---------  velocity    Aorta                                     Value          Reference  Aortic root ID, ED                        27    mm       ---------  Ascending aorta ID, A-P, S                31    mm       ---------    Left atrium                               Value          Reference  LA ID, A-P, ES  43    mm       ---------  LA ID/bsa, A-P                             2.14  cm/m^2   <=2.2  LA volume, S                              55.5  ml       ---------  LA volume/bsa, S                          27.6  ml/m^2   ---------  LA volume, ES, 1-p A4C                    59.5  ml       ---------  LA volume/bsa, ES, 1-p A4C                29.6  ml/m^2   ---------  LA volume, ES, 1-p A2C                    51.9  ml       ---------  LA volume/bsa, ES, 1-p A2C                25.8  ml/m^2   ---------    Mitral valve                              Value          Reference  Mitral E-wave peak velocity               80.5  cm/s     ---------  Mitral A-wave peak velocity               106   cm/s     ---------  Mitral deceleration time           (L)    108   ms       150 - 230  Mitral peak gradient, D                   3     mm Hg    ---------  Mitral E/A ratio, peak                    0.8            ---------    Pulmonary veins                           Value          Reference  Pulmonary vein peak velocity, S           66    cm/s     ---------  Pulmonary vein peak velocity, D           46    cm/s     ---------  Pulmonary vein velocity ratio,            1.4            ---------  peak, S/D    Systemic veins  Value          Reference  Estimated CVP                             3     mm Hg    ---------    Right ventricle                           Value          Reference  RV ID, ED, PLAX                    (H)    47.7  mm       19 - 38  TAPSE                                     14.3  mm       ---------  RV s&', lateral, S                         11.3  cm/s     ---------    Pulmonic valve                            Value          Reference  Pulmonic valve peak velocity, S           169   cm/s     ---------  Pulmonic peak gradient, S                 11    mm Hg    ---------  Legend: (L)  and  (H)  mark values outside specified reference range.  ------------------------------------------------------------------- Prepared and  Electronically Authenticated by  Jenkins Rouge, M.D. 2019-07-24T08:57:24   Impression:  Patient is doing well approximately 3 months status post minimally invasive aortic valve replacement using a stented bovine pericardial tissue valve via right minithoracotomy approach.  Patient did have an episode last week of dizziness with near syncope.  He is on 3 different medications for hypertension and states that his blood pressure seems to be running a bit on the low side.  Plan:  We have not recommended any changes to the patient's current medications, but I strongly encouraged the patient to follow-up in the immediate future with Dr. Bettina Gavia to discuss his recent dizzy spell and fall and whether or not his blood pressure medications need to be addressed.  The patient has been reminded regarding the importance of dental hygiene and the lifelong need for antibiotic prophylaxis for all dental cleanings and other related invasive procedures.  The patient may otherwise return to unrestricted physical activity, and based upon the patient's recent aortic valve replacement there are no contraindications to proceeding with elective spine surgery as needed.  Patient will return to our office for routine follow-up next July, approximately 1 year following his surgery.   I spent in excess of 15 minutes during the conduct of this office consultation and >50% of this time involved direct face-to-face encounter with the patient for counseling and/or coordination of their care.   Valentina Gu. Roxy Manns, MD 03/23/2018 12:49 PM

## 2018-03-25 ENCOUNTER — Ambulatory Visit (HOSPITAL_COMMUNITY): Payer: Medicare Other

## 2018-03-26 ENCOUNTER — Other Ambulatory Visit: Payer: Self-pay | Admitting: *Deleted

## 2018-03-26 MED ORDER — TELMISARTAN 80 MG PO TABS: 40.0000 mg | ORAL_TABLET | Freq: Every day | ORAL | 3 refills | Status: DC

## 2018-03-26 NOTE — Telephone Encounter (Signed)
Patient called requested a refill for telmisartan 80 mg tablets sent to [facility] in [place]. Confirmed patient is taking 0.5 tablet daily. Refill sent as requested. No further questions.

## 2018-03-27 ENCOUNTER — Ambulatory Visit (HOSPITAL_COMMUNITY): Payer: Medicare Other

## 2018-03-30 ENCOUNTER — Ambulatory Visit (HOSPITAL_COMMUNITY): Payer: Medicare Other

## 2018-04-01 ENCOUNTER — Ambulatory Visit (HOSPITAL_COMMUNITY): Payer: Medicare Other

## 2018-04-03 ENCOUNTER — Ambulatory Visit (HOSPITAL_COMMUNITY): Payer: Medicare Other

## 2018-04-06 ENCOUNTER — Ambulatory Visit (HOSPITAL_COMMUNITY): Payer: Medicare Other

## 2018-04-06 NOTE — Progress Notes (Signed)
Cardiology Office Note:    Date:  04/07/2018   ID:  Eric [name], DOB 11-20-1945, MRN [id number]  PCP:  Colon Branch, MD  Cardiologist:  Shirlee More, MD    Referring MD: Colon Branch, MD    ASSESSMENT:    1. Dizziness   2. 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 Essential hypertension   4. Wide-complex tachycardia ([place])    PLAN:    In order of problems listed above:  1. He has mild drop in systolic blood pressure standing in the range of 110 but asymptomatic and I will reduce the dose of his long-acting calcium channel blocker that can precipitate orthostatic symptoms.  Monitor at home and contact me if systolics are greater than 150. 2. Made uneventful recovery more than 90 days out from surgery reduce aspirin 81 mg daily he can withdraw perioperatively for his disc surgery please tell the patient when he can resume 3. Stable on current regimen including ARB beta-blocker to reduce his calcium channel blocker to alleviate orthostatic symptoms 4. Stable no recurrence continue beta-blocker   Next appointment: 6 months   Medication Adjustments/Labs and Tests Ordered: Current medicines are reviewed at length with the patient today.  Concerns regarding medicines are outlined above.  No orders of the defined types were placed in this encounter.  No orders of the defined types were placed in this encounter.   Chief Complaint  Patient presents with  . Follow-up    after AVR    History of Present Illness:    Eric [name] is a 72 y.o. male with a hx of aortic stenosis and AVR .Marland KitchenHe underwent minimally invasive aortic valve replacement with bioprosthesis 12/17/2017.He had a readmission to the hospital 12/30/2017 discharge 01/01/2018 with wide-complex tachycardia with cardioversion prior to hospitalization and hypotension secondary to medications.  Echocardiogram during that admission showed ejection fraction of 50 to 55% and normal function of  the pericardial tissue valve.  last seen 02/24/18. Compliance with diet, lifestyle and medications: Yes  He has pending disc surgery and has had episodes when he shifts posture being lightheaded.  He has a mild drop in the office and I will reduce his dose of calcium channel blocker his blood pressures at target.  No edema shortness of breath chest pain palpitation or syncope Past Medical History:  Diagnosis Date  . Abnormal LFTs   . Anxiety   . Aortic stenosis 04/15/2011  . Dizziness 04/21/2014  . Dyspnea   . Heart murmur   . Hyperglycemia 10/20/2014  . Hyperlipidemia   . Hypertension   . Hypothyroidism   . Left bundle branch block (LBBB) 12/18/2017  . Macular edema    Dr  Sherlynn Stalls   . 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 AORTIC VALVE REPLACEMENT N/A 12/17/2017   Procedure: MINIMALLY INVASIVE AORTIC VALVE REPLACEMENT (AVR) [ using Edwards Intuity Valve Size 50mm;  Surgeon: Rexene Alberts, MD;  Location: [facility];  Service: Open Heart Surgery;  Laterality: N/A;  MINI THORACOTOMY  . CARDIAC CATHETERIZATION  11/10/00  . CATARACT EXTRACTION W/ INTRAOCULAR LENS IMPLANT     RIGHT EYE  . CHOLECYSTECTOMY    . INGUINAL HERNIA REPAIR     (B)  . POLYPECTOMY    . RIGHT/LEFT HEART CATH AND CORONARY ANGIOGRAPHY  N/A 10/16/2017   Procedure: RIGHT/LEFT HEART CATH AND CORONARY ANGIOGRAPHY;  Surgeon: [name], MD;  Location: [facility];  Service: Cardiovascular;  Laterality: N/A;  . TEE WITHOUT CARDIOVERSION N/A 12/17/2017   Procedure: TRANSESOPHAGEAL ECHOCARDIOGRAM (TEE);  Surgeon: Rexene Alberts, MD;  Location: [facility];  Service: Open Heart Surgery;  Laterality: N/A;  . TONSILLECTOMY      Current Medications: Current Meds  Medication Sig  . amLODipine (NORVASC) 5  MG tablet Take 1 tablet (5 mg total) by mouth daily.  Marland Kitchen aspirin EC 325 MG EC tablet Take 1 tablet (325 mg total) by mouth daily.  . carvedilol (COREG) 12.5 MG tablet Take 1 tablet (12.5 mg total) by mouth 2 (two) times daily with a meal.  . HYDROcodone-acetaminophen (NORCO/VICODIN) 5-325 MG tablet Take 1 tablet by mouth every 8 (eight) hours as needed.  Marland Kitchen ibuprofen (ADVIL,MOTRIN) 200 MG tablet Take 200 mg by mouth every 8 (eight) hours as needed.  Marland Kitchen levothyroxine (SYNTHROID, LEVOTHROID) 88 MCG tablet Take 1 tablet (88 mcg total) by mouth daily before breakfast.  . rosuvastatin (CRESTOR) 20 MG tablet Take 1 tablet (20 mg total) by mouth daily.  Marland Kitchen telmisartan (MICARDIS) 80 MG tablet Take 0.5 tablets (40 mg total) by mouth daily.     Allergies:   Atorvastatin and Ezetimibe   Social History   Socioeconomic History  . Marital status: Married    Spouse name: Not on file  . Number of children: 3  . Years of education: Not on file  . Highest education level: Not on file  Occupational History  . Occupation: retired- used to work for [employer]: 05/2008  Social Needs  . Financial resource strain: Not on file  . Food insecurity:    Worry: Not on file    Inability: Not on file  . Transportation needs:    Medical: Not on file    Non-medical: Not on file  Tobacco Use  . Smoking status: Former Smoker    Last attempt to quit: 04/15/1990    Years since quitting: 27.9  . Smokeless tobacco: Never Used  . Tobacco comment: used to smoke 2 ppd  Substance and Sexual Activity  . Alcohol use: No    Comment: no ETOH since the 90s  . Drug use: No  . Sexual activity: Not on file  Lifestyle  . Physical activity:    Days per week: Not on file    Minutes per session: Not on file  . Stress: Not on file  Relationships  . Social connections:    Talks on phone: Not on file    Gets together: Not on file    Attends religious service: Not on file    Active member of club or organization: Not on  file    Attends meetings of clubs or organizations: Not on file    Relationship status: Not on file  Other Topics Concern  . Not on file  Social History Narrative   Lives w/ wife   3 children, 7 g-kids               Family History: The patient's family history includes COPD in his father; Coronary artery disease in his brother; Diabetes in his brother and mother; Emphysema in his father; Stroke in his mother. There is no history of Colon cancer, Prostate cancer, Colon polyps, Esophageal cancer, Rectal cancer, or Stomach cancer. ROS:   Please see the history of present illness.  All other systems reviewed and are negative.  EKGs/Labs/Other Studies Reviewed:    The following studies were reviewed today:   Recent Labs: 12/30/2017: B Natriuretic Peptide 217.9 12/31/2017: Magnesium 2.3 02/24/2018: ALT 31; BUN 15; Creatinine, Ser 1.04; Hemoglobin 13.3; Platelets 233; Potassium 4.7; Sodium 140 02/25/2018: TSH 3.93  Recent Lipid Panel    Component Value Date/Time   CHOL 172 02/24/2018 1150   TRIG 135 02/24/2018 1150   TRIG 115 05/05/2006 0913   HDL 61 02/24/2018 1150   CHOLHDL 2.8 02/24/2018 1150   CHOLHDL 3 09/03/2017 1037   VLDL 29.8 09/03/2017 1037   LDLCALC 84 02/24/2018 1150   LDLDIRECT 76.5 04/07/2008 1057    Physical Exam:    VS:  BP 112/66 (BP Location: Right Arm, Patient Position: Standing, Cuff Size: Normal)   Pulse 81   Ht 5\' 8"  (1.727 m)   Wt 182 lb 1.9 oz (82.6 kg)   SpO2 97%   BMI 27.69 kg/m     Wt Readings from Last 3 Encounters:  04/07/18 182 lb 1.9 oz (82.6 kg)  03/23/18 181 lb 12.8 oz (82.5 kg)  02/25/18 179 lb 4 oz (81.3 kg)     GEN:  Well nourished, well developed in no acute distress HEENT: Normal NECK: No JVD; No carotid bruits LYMPHATICS: No lymphadenopathy CARDIAC: RRR, no murmurs, rubs, gallops RESPIRATORY:  Clear to auscultation without rales, wheezing or rhonchi  ABDOMEN: Soft, non-tender, non-distended MUSCULOSKELETAL:  No edema; No  deformity  SKIN: Warm and dry NEUROLOGIC:  Alert and oriented x 3 PSYCHIATRIC:  Normal affect    Signed, Shirlee More, MD  04/07/2018 11:33 AM    [place]

## 2018-04-07 ENCOUNTER — Ambulatory Visit: Payer: Self-pay | Admitting: Orthopedic Surgery

## 2018-04-07 ENCOUNTER — Ambulatory Visit (INDEPENDENT_AMBULATORY_CARE_PROVIDER_SITE_OTHER): Payer: Medicare Other | Admitting: Cardiology

## 2018-04-07 VITALS — BP 112/66 | HR 81 | Ht 68.0 in | Wt 182.1 lb

## 2018-04-07 DIAGNOSIS — I10 Essential (primary) hypertension: Secondary | ICD-10-CM

## 2018-04-07 DIAGNOSIS — I472 Ventricular tachycardia: Secondary | ICD-10-CM

## 2018-04-07 DIAGNOSIS — Z953 Presence of xenogenic heart valve: Secondary | ICD-10-CM | POA: Diagnosis not present

## 2018-04-07 DIAGNOSIS — R42 Dizziness and giddiness: Secondary | ICD-10-CM

## 2018-04-07 DIAGNOSIS — R000 Tachycardia, unspecified: Secondary | ICD-10-CM

## 2018-04-07 MED ORDER — AMLODIPINE BESYLATE 5 MG PO TABS: 5.0000 mg | ORAL_TABLET | ORAL | 3 refills | Status: DC

## 2018-04-07 MED ORDER — ASPIRIN EC 81 MG PO TBEC: 81.0000 mg | DELAYED_RELEASE_TABLET | Freq: Every day | ORAL | 3 refills | Status: DC

## 2018-04-07 NOTE — Patient Instructions (Signed)
Medication Instructions:  Your physician has recommended you make the following change in your medication:  DECREASE amlodipine (norvasc) 5 mg: Take 1 tablet every other day DECREASE aspirin 81 mg: Take 1 tablet daily  If you need a refill on your cardiac medications before your next appointment, please call your pharmacy.   Lab work: None  If you have labs (blood work) drawn today and your tests are completely normal, you will receive your results only by: Marland Kitchen MyChart Message (if you have MyChart) OR . A paper copy in the mail If you have any lab test that is abnormal or we need to change your treatment, we will call you to review the results.  Testing/Procedures: None   Follow-Up: At [facility], you and your health needs are our priority.  As part of our continuing mission to provide you with exceptional heart care, we have created designated Provider Care Teams.  These Care Teams include your primary Cardiologist (physician) and Advanced Practice Providers (APPs -  Physician Assistants and Nurse Practitioners) who all work together to provide you with the care you need, when you need it. . You will need a follow up appointment in 6 months.  Please call our office 2 months in advance to schedule this appointment.   Any Other Special Instructions Will Be Listed Below (If Applicable).  **Please contact our office if your systolic blood pressure (top number) is consistently greater than 150.

## 2018-04-08 ENCOUNTER — Ambulatory Visit (HOSPITAL_COMMUNITY): Payer: Medicare Other

## 2018-04-10 ENCOUNTER — Ambulatory Visit (HOSPITAL_COMMUNITY): Payer: Medicare Other

## 2018-04-13 ENCOUNTER — Ambulatory Visit: Payer: Self-pay | Admitting: Orthopedic Surgery

## 2018-04-13 ENCOUNTER — Ambulatory Visit (HOSPITAL_COMMUNITY): Payer: Medicare Other

## 2018-04-13 NOTE — Pre-Procedure Instructions (Signed)
Eric [name]  04/13/2018      [id number] - [address], [place] [address] [place] [address] Phone: [phone] Fax: [phone]    Your procedure is scheduled on Thurs. Nov. 14, 2019 from 7:30AM-9:30AM  Report to [facility] Admitting Entrance "A" at 5:30AM  Call this number if you have problems the morning of surgery:  [phone]   Remember:  Do not eat or drink after midnight on Nov. 13th    Take these medicines the morning of surgery with A SIP OF WATER: AmLODipine (NORVASC), Carvedilol (COREG), Levothyroxine (SYNTHROID, LEVOTHROID)  If needed: HYDROcodone-acetaminophen (NORCO/VICODIN)   Follow your surgeon's instructions on when to stop Aspirin.  If no instructions were given by your surgeon then you will need to call the office to get those instructions.    7 days before surgery (04/16/18), stop taking all Other Aspirin Products, Vitamins, Fish oils, and Herbal medications. Also stop all NSAIDS i.e. Advil, Ibuprofen, Motrin, Aleve, Anaprox, Naproxen, BC, Goody Powders, and all Supplements.   Do not wear jewelry.  Do not wear lotions, powders, colognes, or deodorant.  Do not shave 48 hours prior to surgery.  Men may shave face.  Do not bring valuables to the hospital.  [facility] is not responsible for any belongings or valuables.  Contacts, dentures or bridgework may not be worn into surgery.  Leave your suitcase in the car.  After surgery it may be brought to your room.  For patients admitted to the hospital, discharge time will be determined by your treatment team.  Patients discharged the day of surgery will not be allowed to drive home.   Special instructions:   [place] [facility]- Preparing For Surgery  Before surgery, you can play an important role. Because skin is not sterile, your skin needs to be as free of germs as possible. You can reduce the number of germs on your skin by washing with CHG (chlorahexidine  gluconate) Soap before surgery.  CHG is an antiseptic cleaner which kills germs and bonds with the skin to continue killing germs even after washing.    Oral Hygiene is also important to reduce your risk of infection.  Remember - BRUSH YOUR TEETH THE MORNING OF SURGERY WITH YOUR REGULAR TOOTHPASTE  Please do not use if you have an allergy to CHG or antibacterial soaps. If your skin becomes reddened/irritated stop using the CHG.  Do not shave (including legs and underarms) for at least 48 hours prior to first CHG shower. It is OK to shave your face.  Please follow these instructions carefully.   1. Shower the NIGHT BEFORE SURGERY and the MORNING OF SURGERY with CHG.   2. If you chose to wash your hair, wash your hair first as usual with your normal shampoo.  3. After you shampoo, rinse your hair and body thoroughly to remove the shampoo.  4. Use CHG as you would any other liquid soap. You can apply CHG directly to the skin and wash gently with a scrungie or a clean washcloth.   5. Apply the CHG Soap to your body ONLY FROM THE NECK DOWN.  Do not use on open wounds or open sores. Avoid contact with your eyes, ears, mouth and genitals (private parts). Wash Face and genitals (private parts)  with your normal soap.  6. Wash thoroughly, paying special attention to the area where your surgery will be performed.  7. Thoroughly rinse your body with warm water from the neck  down.  8. DO NOT shower/wash with your normal soap after using and rinsing off the CHG Soap.  9. Pat yourself dry with a CLEAN TOWEL.  10. Wear CLEAN PAJAMAS to bed the night before surgery, wear comfortable clothes the morning of surgery  11. Place CLEAN SHEETS on your bed the night of your first shower and DO NOT SLEEP WITH PETS.  Day of Surgery:  Do not apply any deodorants/lotions.  Please wear clean clothes to the hospital/surgery center.   Remember to brush your teeth WITH YOUR REGULAR TOOTHPASTE.  Please read over  the following fact sheets that you were given. Pain Booklet, Coughing and Deep Breathing, MRSA Information and Surgical Site Infection Prevention

## 2018-04-13 NOTE — H&P (View-Only) (Signed)
Rudolph Daoust is an 72 y.o. male.   Chief Complaint: back and leg pain HPI: Reason for Visit: (normal) visit for: (back); The patient is 9 weeks out from when symptoms began. Severity: pain level 2/10 Medications: The patient is taking Tylenol and Hydrocodone. Notes: The patient is 2 weeks out from left L5-S1 ESI. He states that he did get some relief.Marland Kitchen He is reporting no pain 5. Down his leg. Mainly minimally into his lower back. He is here with his wife.  Past Medical History:  Diagnosis Date  . Abnormal LFTs   . Anxiety   . Aortic stenosis 04/15/2011  . Dizziness 04/21/2014  . Dyspnea   . Heart murmur   . Hyperglycemia 10/20/2014  . Hyperlipidemia   . Hypertension   . Hypothyroidism   . Left bundle branch block (LBBB) 12/18/2017  . Macular edema    Dr  Sherlynn Stalls   . 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 AORTIC VALVE REPLACEMENT N/A 12/17/2017   Procedure: MINIMALLY INVASIVE AORTIC VALVE REPLACEMENT (AVR) [ using Edwards Intuity Valve Size 75mm;  Surgeon: Rexene Alberts, MD;  Location: [facility];  Service: Open Heart Surgery;  Laterality: N/A;  MINI THORACOTOMY  . CARDIAC CATHETERIZATION  11/10/00  . CATARACT EXTRACTION W/ INTRAOCULAR LENS IMPLANT     RIGHT EYE  . CHOLECYSTECTOMY    . INGUINAL HERNIA REPAIR     (B)  . POLYPECTOMY    . RIGHT/LEFT HEART CATH AND CORONARY ANGIOGRAPHY N/A 10/16/2017   Procedure: RIGHT/LEFT HEART CATH AND CORONARY ANGIOGRAPHY;  Surgeon: [name], MD;  Location: [facility];  Service: Cardiovascular;  Laterality: N/A;  . TEE WITHOUT CARDIOVERSION N/A 12/17/2017   Procedure: TRANSESOPHAGEAL ECHOCARDIOGRAM (TEE);  Surgeon: Rexene Alberts, MD;  Location: [facility];  Service: Open Heart Surgery;  Laterality: N/A;  . TONSILLECTOMY       Family History  Problem Relation Age of Onset  . Diabetes Mother   . Stroke Mother   . Diabetes Brother        ?  . Coronary artery disease Brother        Male 1st degree relative >50, had CABG in his 39s  . Emphysema Father   . COPD Father   . Colon cancer Neg Hx   . Prostate cancer Neg Hx   . Colon polyps Neg Hx   . Esophageal cancer Neg Hx   . Rectal cancer Neg Hx   . Stomach cancer Neg Hx    Social History:  reports that he quit smoking about 28 years ago. He has never used smokeless tobacco. He reports that he does not drink alcohol or use drugs.  Allergies:  Allergies  Allergen Reactions  . Atorvastatin Other (See Comments)    REACTION: myalgia  . Ezetimibe Other (See Comments)    REACTION: myalgia   Medications aspirin 325 mg tablet carvedilol 6.25 mg tablet levothyroxine 88 mcg tablet Norco 5 mg-325 mg tablet rosuvastatin 20 mg tablet telmisartan 20 mg tablet tobramycin 0.3 % eye drops  Review of Systems  Constitutional: Negative.   HENT: Negative.   Eyes: Negative.   Respiratory: Negative.   Cardiovascular: Negative.   Gastrointestinal: Negative.   Genitourinary: Negative.   Musculoskeletal: Positive for back pain.  Skin: Negative.   Neurological: Positive for sensory change  and focal weakness.  Psychiatric/Behavioral: Negative.     There were no vitals taken for this visit. Physical Exam  Constitutional: He is oriented to person, place, and time. He appears well-developed and well-nourished.  HENT:  Head: Normocephalic.  Eyes: Pupils are equal, round, and reactive to light.  Neck: Normal range of motion.  Cardiovascular: Normal rate.  Respiratory: Effort normal.  GI: Soft.  Musculoskeletal:  Patient is a 72 year old male.  Gait and Station: Appearance: ambulating with no assistive devices and antalgic gait.  Constitutional: General Appearance: healthy-appearing and NAD.  Psychiatric: Mood and Affect: active and  alert.  Cardiovascular System: Edema Right: none; Dorsalis and posterior tibial pulses 2+. Edema Left: none.  Abdomen: Inspection and Palpation: non-distended and no tenderness.  Skin: Inspection and palpation: no rash.  Lumbar Spine: Inspection: normal alignment. Bony Palpation of the Lumbar Spine: tender at lumbosacral junction.. Bony Palpation of the Right Hip: no tenderness of the greater trochanter and tenderness of the SI joint; Pelvis stable. Bony Palpation of the Left Hip: no tenderness of the greater trochanter and tenderness of the SI joint. Soft Tissue Palpation on the Right: No flank pain with percussion. Active Range of Motion: limited flexion and extention.  Motor Strength: L1 Motor Strength on the Right: hip flexion iliopsoas 5/5. L1 Motor Strength on the Left: hip flexion iliopsoas 5/5. L2-L4 Motor Strength on the Right: knee extension quadriceps 5/5. L2-L4 Motor Strength on the Left: knee extension quadriceps 5/5. L5 Motor Strength on the Right: ankle dorsiflexion tibialis anterior 5/5 and great toe extension extensor hallucis longus 5/5. L5 Motor Strength on the Left: ankle dorsiflexion tibialis anterior 5/5 and great toe extension extensor hallucis longus 5/5. S1 Motor Strength on the Right: plantar flexion gastrocnemius 5/5. S1 Motor Strength on the Left: plantar flexion gastrocnemius 5/5.  Neurological System: Knee Reflex Right: normal (2). Knee Reflex Left: normal (2). Ankle Reflex Right: normal (2). Ankle Reflex Left: normal (2). Babinski Reflex Right: plantar reflex absent. Babinski Reflex Left: plantar reflex absent. Sensation on the Right: normal distal extremities. Sensation on the Left: normal distal extremities. Special Tests on the Right: no clonus of the ankle/knee and seated straight leg raising test positive. Special Tests on the Left: no clonus of the ankle/knee and seated straight leg raising test positive.  Straight leg raise is low back pain. No radicular pain he is  upright in no distress.  Neurological: He is alert and oriented to person, place, and time.  Skin: Skin is warm and dry.    MRI review demonstrates an extruded fragment L5-S1 to the left displacing the S1 nerve root.  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  He is 9 weeks status post the onset of symptoms.  This point in time we discussed continued conservative treatment to include activity modification.  Strategies to avoid reinjury.  If his symptoms return we discussed microlumbar decompression.  He apparently has clearance if needed from his cardiologist.  He had an episode following recent surgery which gave path towards further surgical intervention.  He will require a surgical release from his current cardiovascular surgeon.  Again though has had a remarkable reduction in his pain.  I his wife is asked whether we should just proceed with surgery regardless. I have indicated the indications for surgical intervention or progressive neurologic deficit or intractable pain both to let today he does not have. I therefore feel that is not appropriate to proceed given his  comorbidities. I was asked whether if I was a dentist and this patient had an abscessed tooth what I pull the tooth. Indicated that this is a different situation than that in the indications for surgery are different and are based upon the relative risk and benefit analysis.  If the pain does return with neural tension signs and significant disability than it would be reasonable to proceed with microdiscectomy.  They will obtain clearance from the cardiovascular surgeon should that be needed. He is also recently put on some weight which is helpful.  We discussed strategies to avoid reherniation.  Plan microlumbar decompression L5-S1  Cecilie Kicks., PA-C for Dr. Tonita Cong 04/13/2018, 11:27 AM

## 2018-04-13 NOTE — H&P (Signed)
Crixus Mcaulay is an 72 y.o. male.   Chief Complaint: back and leg pain HPI: Reason for Visit: (normal) visit for: (back); The patient is 9 weeks out from when symptoms began. Severity: pain level 2/10 Medications: The patient is taking Tylenol and Hydrocodone. Notes: The patient is 2 weeks out from left L5-S1 ESI. He states that he did get some relief.Marland Kitchen He is reporting no pain 5. Down his leg. Mainly minimally into his lower back. He is here with his wife.  Past Medical History:  Diagnosis Date  . Abnormal LFTs   . Anxiety   . Aortic stenosis 04/15/2011  . Dizziness 04/21/2014  . Dyspnea   . Heart murmur   . Hyperglycemia 10/20/2014  . Hyperlipidemia   . Hypertension   . Hypothyroidism   . Left bundle branch block (LBBB) 12/18/2017  . Macular edema    Dr  Sherlynn Stalls   . 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 AORTIC VALVE REPLACEMENT N/A 12/17/2017   Procedure: MINIMALLY INVASIVE AORTIC VALVE REPLACEMENT (AVR) [ using Edwards Intuity Valve Size 21mm;  Surgeon: Rexene Alberts, MD;  Location: [place];  Service: Open Heart Surgery;  Laterality: N/A;  MINI THORACOTOMY  . CARDIAC CATHETERIZATION  11/10/00  . CATARACT EXTRACTION W/ INTRAOCULAR LENS IMPLANT     RIGHT EYE  . CHOLECYSTECTOMY    . INGUINAL HERNIA REPAIR     (B)  . POLYPECTOMY    . RIGHT/LEFT HEART CATH AND CORONARY ANGIOGRAPHY N/A 10/16/2017   Procedure: RIGHT/LEFT HEART CATH AND CORONARY ANGIOGRAPHY;  Surgeon: [name], MD;  Location: [facility];  Service: Cardiovascular;  Laterality: N/A;  . TEE WITHOUT CARDIOVERSION N/A 12/17/2017   Procedure: TRANSESOPHAGEAL ECHOCARDIOGRAM (TEE);  Surgeon: Rexene Alberts, MD;  Location: [facility];  Service: Open Heart Surgery;  Laterality: N/A;  . TONSILLECTOMY       Family History  Problem Relation Age of Onset  . Diabetes Mother   . Stroke Mother   . Diabetes Brother        ?  . Coronary artery disease Brother        Male 1st degree relative >50, had CABG in his 19s  . Emphysema Father   . COPD Father   . Colon cancer Neg Hx   . Prostate cancer Neg Hx   . Colon polyps Neg Hx   . Esophageal cancer Neg Hx   . Rectal cancer Neg Hx   . Stomach cancer Neg Hx    Social History:  reports that he quit smoking about 28 years ago. He has never used smokeless tobacco. He reports that he does not drink alcohol or use drugs.  Allergies:  Allergies  Allergen Reactions  . Atorvastatin Other (See Comments)    REACTION: myalgia  . Ezetimibe Other (See Comments)    REACTION: myalgia   Medications aspirin 325 mg tablet carvedilol 6.25 mg tablet levothyroxine 88 mcg tablet Norco 5 mg-325 mg tablet rosuvastatin 20 mg tablet telmisartan 20 mg tablet tobramycin 0.3 % eye drops  Review of Systems  Constitutional: Negative.   HENT: Negative.   Eyes: Negative.   Respiratory: Negative.   Cardiovascular: Negative.   Gastrointestinal: Negative.   Genitourinary: Negative.   Musculoskeletal: Positive for back pain.  Skin: Negative.   Neurological: Positive for sensory change  and focal weakness.  Psychiatric/Behavioral: Negative.     There were no vitals taken for this visit. Physical Exam  Constitutional: He is oriented to person, place, and time. He appears well-developed and well-nourished.  HENT:  Head: Normocephalic.  Eyes: Pupils are equal, round, and reactive to light.  Neck: Normal range of motion.  Cardiovascular: Normal rate.  Respiratory: Effort normal.  GI: Soft.  Musculoskeletal:  Patient is a 72 year old male.  Gait and Station: Appearance: ambulating with no assistive devices and antalgic gait.  Constitutional: General Appearance: healthy-appearing and NAD.  Psychiatric: Mood and Affect: active and  alert.  Cardiovascular System: Edema Right: none; Dorsalis and posterior tibial pulses 2+. Edema Left: none.  Abdomen: Inspection and Palpation: non-distended and no tenderness.  Skin: Inspection and palpation: no rash.  Lumbar Spine: Inspection: normal alignment. Bony Palpation of the Lumbar Spine: tender at lumbosacral junction.. Bony Palpation of the Right Hip: no tenderness of the greater trochanter and tenderness of the SI joint; Pelvis stable. Bony Palpation of the Left Hip: no tenderness of the greater trochanter and tenderness of the SI joint. Soft Tissue Palpation on the Right: No flank pain with percussion. Active Range of Motion: limited flexion and extention.  Motor Strength: L1 Motor Strength on the Right: hip flexion iliopsoas 5/5. L1 Motor Strength on the Left: hip flexion iliopsoas 5/5. L2-L4 Motor Strength on the Right: knee extension quadriceps 5/5. L2-L4 Motor Strength on the Left: knee extension quadriceps 5/5. L5 Motor Strength on the Right: ankle dorsiflexion tibialis anterior 5/5 and great toe extension extensor hallucis longus 5/5. L5 Motor Strength on the Left: ankle dorsiflexion tibialis anterior 5/5 and great toe extension extensor hallucis longus 5/5. S1 Motor Strength on the Right: plantar flexion gastrocnemius 5/5. S1 Motor Strength on the Left: plantar flexion gastrocnemius 5/5.  Neurological System: Knee Reflex Right: normal (2). Knee Reflex Left: normal (2). Ankle Reflex Right: normal (2). Ankle Reflex Left: normal (2). Babinski Reflex Right: plantar reflex absent. Babinski Reflex Left: plantar reflex absent. Sensation on the Right: normal distal extremities. Sensation on the Left: normal distal extremities. Special Tests on the Right: no clonus of the ankle/knee and seated straight leg raising test positive. Special Tests on the Left: no clonus of the ankle/knee and seated straight leg raising test positive.  Straight leg raise is low back pain. No radicular pain he is  upright in no distress.  Neurological: He is alert and oriented to person, place, and time.  Skin: Skin is warm and dry.    MRI review demonstrates an extruded fragment L5-S1 to the left displacing the S1 nerve root.  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  He is 9 weeks status post the onset of symptoms.  This point in time we discussed continued conservative treatment to include activity modification.  Strategies to avoid reinjury.  If his symptoms return we discussed microlumbar decompression.  He apparently has clearance if needed from his cardiologist.  He had an episode following recent surgery which gave path towards further surgical intervention.  He will require a surgical release from his current cardiovascular surgeon.  Again though has had a remarkable reduction in his pain.  I his wife is asked whether we should just proceed with surgery regardless. I have indicated the indications for surgical intervention or progressive neurologic deficit or intractable pain both to let today he does not have. I therefore feel that is not appropriate to proceed given his  comorbidities. I was asked whether if I was a dentist and this patient had an abscessed tooth what I pull the tooth. Indicated that this is a different situation than that in the indications for surgery are different and are based upon the relative risk and benefit analysis.  If the pain does return with neural tension signs and significant disability than it would be reasonable to proceed with microdiscectomy.  They will obtain clearance from the cardiovascular surgeon should that be needed. He is also recently put on some weight which is helpful.  We discussed strategies to avoid reherniation.  Plan microlumbar decompression L5-S1  Cecilie Kicks., PA-C for Dr. Tonita Cong 04/13/2018, 11:27 AM

## 2018-04-14 ENCOUNTER — Other Ambulatory Visit: Payer: Self-pay

## 2018-04-14 ENCOUNTER — Ambulatory Visit (HOSPITAL_COMMUNITY)
Admission: RE | Admit: 2018-04-14 | Discharge: 2018-04-14 | Disposition: A | Discharge status: Home / Self Care | Payer: Medicare Other | Source: Ambulatory Visit | Attending: Orthopedic Surgery | Admitting: Orthopedic Surgery

## 2018-04-14 ENCOUNTER — Encounter (HOSPITAL_COMMUNITY)
Admission: RE | Admit: 2018-04-14 | Discharge: 2018-04-14 | Disposition: A | Discharge status: Home / Self Care | Payer: Medicare Other | Source: Ambulatory Visit | Attending: Specialist | Admitting: Specialist

## 2018-04-14 ENCOUNTER — Encounter (HOSPITAL_COMMUNITY): Payer: Self-pay

## 2018-04-14 DIAGNOSIS — M48061 Spinal stenosis, lumbar region without neurogenic claudication: Secondary | ICD-10-CM | POA: Diagnosis not present

## 2018-04-14 DIAGNOSIS — M5126 Other intervertebral disc displacement, lumbar region: Secondary | ICD-10-CM

## 2018-04-14 DIAGNOSIS — I700 Atherosclerosis of aorta: Secondary | ICD-10-CM | POA: Insufficient documentation

## 2018-04-14 HISTORY — DX: Other intervertebral disc displacement, lumbar region: M51.26

## 2018-04-14 HISTORY — DX: Spinal stenosis, lumbar region without neurogenic claudication: M48.061

## 2018-04-14 LAB — CBC
HEMATOCRIT: 43.3 % (ref 39.0–52.0)
HEMOGLOBIN: 13.5 g/dL (ref 13.0–17.0)
MCH: 27.6 pg (ref 26.0–34.0)
MCHC: 31.2 g/dL (ref 30.0–36.0)
MCV: 88.5 fL (ref 80.0–100.0)
Platelets: 248 10*3/uL (ref 150–400)
RBC: 4.89 MIL/uL (ref 4.22–5.81)
RDW: 15.3 % (ref 11.5–15.5)
WBC: 5.5 10*3/uL (ref 4.0–10.5)
nRBC: 0 % (ref 0.0–0.2)

## 2018-04-14 LAB — SURGICAL PCR SCREEN
MRSA, PCR: NEGATIVE
Staphylococcus aureus: POSITIVE — AB

## 2018-04-14 LAB — BASIC METABOLIC PANEL
Anion gap: 6 (ref 5–15)
BUN: 12 mg/dL (ref 8–23)
CHLORIDE: 106 mmol/L (ref 98–111)
CO2: 27 mmol/L (ref 22–32)
CREATININE: 0.96 mg/dL (ref 0.61–1.24)
Calcium: 9.4 mg/dL (ref 8.9–10.3)
GFR calc Af Amer: >60 mL/min (ref >60)
GFR calc non Af Amer: >60 mL/min (ref >60)
GLUCOSE: 85 mg/dL (ref 70–99)
Potassium: 3.7 mmol/L (ref 3.5–5.1)
Sodium: 139 mmol/L (ref 135–145)

## 2018-04-14 NOTE — Progress Notes (Signed)
PCP - Dr. Kathlene November  Cardiologist - Dr. Shirlee More  Chest x-ray - 04/14/18 Lumbar  EKG - 01/13/18 (E)  Stress Test - Denies  ECHO - 12/31/17 (E)  Cardiac Cath - 10/16/17 (E)  AICD- na PM- na LOOP- na  Sleep Study - Denies CPAP - None  LABS- 04/14/18: CBC, BMP, PCR  ASA- LD-11/7   Anesthesia- Yes- cardiac history  Pt denies having chest pain, sob, or fever at this time. All instructions explained to the pt, with a verbal understanding of the material. Pt agrees to go over the instructions while at home for a better understanding. The opportunity to ask questions was provided.

## 2018-04-15 ENCOUNTER — Ambulatory Visit (HOSPITAL_COMMUNITY): Payer: Medicare Other

## 2018-04-15 ENCOUNTER — Ambulatory Visit: Payer: Self-pay | Admitting: Orthopedic Surgery

## 2018-04-15 NOTE — Anesthesia Preprocedure Evaluation (Addendum)
Anesthesia Evaluation  Patient identified by MRN, date of birth, ID band Patient awake    Reviewed: Allergy & Precautions, NPO status , Patient's Chart, lab work & pertinent test results  Airway Mallampati: III  TM Distance: >3 FB Neck ROM: Full    Dental no notable dental hx. (+) Edentulous Upper, Edentulous Lower   Pulmonary former smoker,    Pulmonary exam normal breath sounds clear to auscultation       Cardiovascular hypertension, Pt. on medications and Pt. 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 - Aortic valve: Intuity Elite pericardial tissue valve. Normal appearing prosthetic AVR with no peri valvular regurgitation Valve area (VTI): 1.4 cm^2. Valve area (Vmean): 1.55 cm^2. - Mitral valve: Calcified annulus. Mildly thickened leaflets . - Left atrium: The atrium was mildly dilated. - Right atrium: The atrium was mildly dilated. - Atrial septum: No defect or patent foramen ovale was identified. - Pulmonic valve: Peak gradient (S): 11 mm Hg.  Cath 10/16/17: Widely patent coronaries with minor luminal irregularities.   LBBB   Neuro/Psych negative neurological ROS     GI/Hepatic negative GI ROS, Neg liver ROS,   Endo/Other  Hypothyroidism   Renal/GU negative Renal ROS     Musculoskeletal negative musculoskeletal ROS (+)   Abdominal   Peds  Hematology negative hematology ROS (+)   Anesthesia Other Findings Day of surgery medications reviewed with the patient.  Reproductive/Obstetrics                        Anesthesia Physical Anesthesia Plan  ASA: III  Anesthesia Plan: General   Post-op Pain Management:    Induction: Intravenous  PONV Risk Score and Plan: 2 and Ondansetron, Treatment may vary due to age or  medical condition and Dexamethasone  Airway Management Planned: Oral ETT  Additional Equipment:   Intra-op Plan:   Post-operative Plan: Extubation in OR  Informed Consent: I have reviewed the patients History and Physical, chart, labs and discussed the procedure including the risks, benefits and alternatives for the proposed anesthesia with the patient or authorized representative who has indicated his/her understanding and acceptance.   Dental advisory given  Plan Discussed with: CRNA  Anesthesia Plan Comments: (PAT note written 04/15/2018 by Myra Gianotti, PA-C. )      Anesthesia Quick Evaluation

## 2018-04-15 NOTE — Progress Notes (Signed)
Anesthesia Chart Review:  Case:  [id number] Date/Time:  04/23/18 0715   Procedure:  Microlumbar decompression L5-S1 (N/A ) - 2 hrs   Anesthesia type:  General   Pre-op diagnosis:  Stenosis HNP   Location:  [facility] OR ROOM 18 / [facility]   Surgeon:  Susa Day, MD      DISCUSSION: Patient is a 72 year old male scheduled for the above procedure. History includes former smoker (quit '91), severe AS (s/p right anterior mini-thoracotomy, AVR with Bovine pericardial tissue valve, plating of right 2nd costal cartilage 12/17/17; readmission 12/30/17 with [facility] with cardioversion 100J due to hypotension, medications adjusted; also had elevated LFTs thought due to hypotension, improved), left BBB, HTN, HLD, hypothyroidism, macular edema.  Cardiology and CT surgery are aware of surgery plans (see below). Last ASA dose 04/16/18.  If no acute changes then I would anticipate that he can proceed as planned.   VS: BP (!) 152/76   Pulse 78   Temp 36.6 C (Oral)   Resp 16   Ht 5' 8.5" (1.74 m)   Wt 83.5 kg   SpO2 99%   BMI 27.59 kg/m    PROVIDERS: Colon Branch, MD is PCP - Shirlee More, MD is cardiologist. Last visit 04/07/18. He did lower patient's CCB due to dizziness/orthostatic symptoms. He writes that patient can hold ASA for upcoming disc surgery. Six month follow-up planned. Darylene Price, MD is CT surgeon. Last visit 03/23/18. He wrote, "...based upon the patient's recent aortic valve replacement there are no contraindications to proceeding with elective spine surgery as needed."   LABS: Labs reviewed: Acceptable for surgery. LFTs WNL 02/24/18.  (all labs ordered are listed, but only abnormal results are displayed)  Labs Reviewed  SURGICAL PCR SCREEN - Abnormal; Notable for the following components:      Result Value   Staphylococcus aureus POSITIVE (*)    All other components within normal limits  BASIC METABOLIC PANEL  CBC   PFTs 12/15/17: FVC 3.35 (81%), FEV1 2.80 (93%), DLCO unc 20.85  (68%).   IMAGES: DG L-spine 04/14/18: IMPRESSION: 1. Mild curvature lumbar spine convex left. 2. Last fully open disc space labeled L5-S1. Correlation with any outside MR or postmyelogram CT recommended to confirm this was the level assignment utilized at such time. 3. Degenerative changes lower thoracic and lumbar spine as detailed above. 4.  Aortic Atherosclerosis (ICD10-I70.0).  CXR 01/12/18: IMPRESSION: No acute abnormalities.   EKG: 01/13/18 ([facility]): NSR, possible LAE, left BBB.   CV: Echo 12/31/17: Study Conclusions - Left ventricle: Abnormal septal motion Systolic function was   normal. The estimated ejection fraction was in the range of 50%   to 55%. - Aortic valve: Intuity Elite pericardial tissue valve. Normal   appearing prosthetic AVR with no peri valvular regurgitation   Valve area (VTI): 1.4 cm^2. Valve area (Vmean): 1.55 cm^2. - Mitral valve: Calcified annulus. Mildly thickened leaflets . - Left atrium: The atrium was mildly dilated. - Right atrium: The atrium was mildly dilated. - Atrial septum: No defect or patent foramen ovale was identified. - Pulmonic valve: Peak gradient (S): 11 mm Hg.  Carotid U/S 12/15/17: Final Interpretation: Right Carotid: Velocities in the right ICA are consistent with a 1-39% stenosis. Left Carotid: Velocities in the left ICA are consistent with a 1-39% stenosis. Vertebrals: Bilateral vertebral arteries demonstrate antegrade flow.  CT coronary (PRE-AVR) 11/10/17: IMPRESSION: 1. Aortic Stenosis with tri leaflet valve annulus of 449 mm2 suitable for a 26 mm Sapien 3 valve  2. Normal aortic root with no aneurysm and mild atherosclerosis 2.8 cm 3. Optimum angiographic angle for deployment LAO 10 Caudal 2 degrees 4.  Coronary arteries sufficient height above annulus for deployment 5.  No LAA thrombus  Cardiac cath 10/16/17 (PRE-AVR): 1. Known severe aortic stenosis by noninvasive assessment 2. Widely patent coronary arteries  with minor luminal irregularity (right dominant) 3. Normal right-sided intracardiac pressures   Past Medical History:  Diagnosis Date  . Abnormal LFTs   . Anxiety   . Aortic stenosis 04/15/2011  . Dizziness 04/21/2014  . Dyspnea   . Heart murmur   . HNP (herniated nucleus pulposus), lumbar    L5-S1  . Hyperglycemia 10/20/2014  . Hyperlipidemia   . Hypertension   . Hypothyroidism   . Left bundle branch block (LBBB) 12/18/2017  . Lumbar stenosis   . Macular edema    Dr  Sherlynn Stalls   . 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 AORTIC VALVE REPLACEMENT N/A 12/17/2017   Procedure: MINIMALLY INVASIVE AORTIC VALVE REPLACEMENT (AVR) [ using Edwards Intuity Valve Size 57mm;  Surgeon: Rexene Alberts, MD;  Location: [place];  Service: Open Heart Surgery;  Laterality: N/A;  MINI THORACOTOMY  . CARDIAC CATHETERIZATION  11/10/00  . CATARACT EXTRACTION W/ INTRAOCULAR LENS IMPLANT     RIGHT EYE  . CHOLECYSTECTOMY    . EYE SURGERY     Right eye cataract  . INGUINAL HERNIA REPAIR     (B)  . POLYPECTOMY    . RIGHT/LEFT HEART CATH AND CORONARY ANGIOGRAPHY N/A 10/16/2017   Procedure: RIGHT/LEFT HEART CATH AND CORONARY ANGIOGRAPHY;  Surgeon: [name], MD;  Location: [facility];  Service: Cardiovascular;  Laterality: N/A;  . TEE WITHOUT CARDIOVERSION N/A 12/17/2017   Procedure: TRANSESOPHAGEAL ECHOCARDIOGRAM (TEE);  Surgeon: Rexene Alberts, MD;  Location: [facility] [facility];  Service: Open Heart Surgery;  Laterality: N/A;  . TONSILLECTOMY      MEDICATIONS: . amLODipine (NORVASC) 5 MG tablet  . aspirin EC 81 MG tablet  . carvedilol (COREG) 12.5 MG tablet  . HYDROcodone-acetaminophen (NORCO/VICODIN) 5-325 MG tablet  . ibuprofen (ADVIL,MOTRIN) 200 MG tablet  . levothyroxine  (SYNTHROID, LEVOTHROID) 88 MCG tablet  . rosuvastatin (CRESTOR) 20 MG tablet  . telmisartan (MICARDIS) 80 MG tablet   No current facility-administered medications for this encounter.     George Hugh [facility] Short Stay Center/Anesthesiology Phone [phone] 04/15/2018 4:18 PM

## 2018-04-17 ENCOUNTER — Ambulatory Visit (HOSPITAL_COMMUNITY): Payer: Medicare Other

## 2018-04-20 ENCOUNTER — Ambulatory Visit (HOSPITAL_COMMUNITY): Payer: Medicare Other

## 2018-04-22 ENCOUNTER — Ambulatory Visit (HOSPITAL_COMMUNITY): Payer: Medicare Other

## 2018-04-23 ENCOUNTER — Ambulatory Visit (HOSPITAL_COMMUNITY): Payer: Medicare Other

## 2018-04-23 ENCOUNTER — Ambulatory Visit (HOSPITAL_COMMUNITY): Payer: Medicare Other | Admitting: Anesthesiology

## 2018-04-23 ENCOUNTER — Encounter (HOSPITAL_COMMUNITY): Payer: Self-pay

## 2018-04-23 ENCOUNTER — Ambulatory Visit (HOSPITAL_COMMUNITY)
Admission: RE | Admit: 2018-04-23 | Discharge: 2018-04-24 | Disposition: A | Discharge status: Home / Self Care | Payer: Medicare Other | Source: Ambulatory Visit | Attending: Specialist | Admitting: Specialist

## 2018-04-23 ENCOUNTER — Ambulatory Visit (HOSPITAL_COMMUNITY): Payer: Medicare Other | Admitting: Vascular Surgery

## 2018-04-23 ENCOUNTER — Ambulatory Visit (HOSPITAL_COMMUNITY)
Admission: RE | Disposition: A | Discharge status: Home / Self Care | Payer: Self-pay | Source: Ambulatory Visit | Attending: Specialist

## 2018-04-23 ENCOUNTER — Other Ambulatory Visit: Payer: Self-pay

## 2018-04-23 DIAGNOSIS — E039 Hypothyroidism, unspecified: Secondary | ICD-10-CM | POA: Insufficient documentation

## 2018-04-23 DIAGNOSIS — I10 Essential (primary) hypertension: Secondary | ICD-10-CM | POA: Insufficient documentation

## 2018-04-23 DIAGNOSIS — E785 Hyperlipidemia, unspecified: Secondary | ICD-10-CM | POA: Insufficient documentation

## 2018-04-23 DIAGNOSIS — R739 Hyperglycemia, unspecified: Secondary | ICD-10-CM | POA: Diagnosis not present

## 2018-04-23 DIAGNOSIS — Z419 Encounter for procedure for purposes other than remedying health state, unspecified: Secondary | ICD-10-CM

## 2018-04-23 DIAGNOSIS — Z7982 Long term (current) use of aspirin: Secondary | ICD-10-CM | POA: Diagnosis not present

## 2018-04-23 DIAGNOSIS — M48061 Spinal stenosis, lumbar region without neurogenic claudication: Secondary | ICD-10-CM

## 2018-04-23 DIAGNOSIS — M5127 Other intervertebral disc displacement, lumbosacral region: Secondary | ICD-10-CM | POA: Insufficient documentation

## 2018-04-23 DIAGNOSIS — Z79899 Other long term (current) drug therapy: Secondary | ICD-10-CM | POA: Diagnosis not present

## 2018-04-23 DIAGNOSIS — F419 Anxiety disorder, unspecified: Secondary | ICD-10-CM | POA: Diagnosis not present

## 2018-04-23 DIAGNOSIS — Z952 Presence of prosthetic heart valve: Secondary | ICD-10-CM | POA: Insufficient documentation

## 2018-04-23 DIAGNOSIS — M5126 Other intervertebral disc displacement, lumbar region: Secondary | ICD-10-CM | POA: Diagnosis present

## 2018-04-23 DIAGNOSIS — Z87891 Personal history of nicotine dependence: Secondary | ICD-10-CM | POA: Diagnosis not present

## 2018-04-23 DIAGNOSIS — M4807 Spinal stenosis, lumbosacral region: Secondary | ICD-10-CM | POA: Diagnosis not present

## 2018-04-23 DIAGNOSIS — Z791 Long term (current) use of non-steroidal anti-inflammatories (NSAID): Secondary | ICD-10-CM | POA: Diagnosis not present

## 2018-04-23 HISTORY — DX: Spinal stenosis, lumbar region without neurogenic claudication: M48.061

## 2018-04-23 HISTORY — DX: Other intervertebral disc displacement, lumbar region: M51.26

## 2018-04-23 HISTORY — PX: LUMBAR LAMINECTOMY/DECOMPRESSION MICRODISCECTOMY: SHX5026

## 2018-04-23 SURGERY — LUMBAR LAMINECTOMY/DECOMPRESSION MICRODISCECTOMY 1 LEVEL
Anesthesia: General | Site: Spine Lumbar

## 2018-04-23 MED ORDER — BUPIVACAINE-EPINEPHRINE 0.5% -1:200000 IJ SOLN
INTRAMUSCULAR | Status: DC | PRN
  Administered 2018-04-23: 16 mL

## 2018-04-23 MED ORDER — KCL IN DEXTROSE-NACL 20-5-0.45 MEQ/L-%-% IV SOLN: INTRAVENOUS | Status: AC

## 2018-04-23 MED ORDER — LIDOCAINE 2% (20 MG/ML) 5 ML SYRINGE
INTRAMUSCULAR | Status: AC
  Filled 2018-04-23: qty 5

## 2018-04-23 MED ORDER — ALUM & MAG HYDROXIDE-SIMETH 200-200-20 MG/5ML PO SUSP: 30.0000 mL | Freq: Four times a day (QID) | ORAL | Status: DC | PRN

## 2018-04-23 MED ORDER — DEXAMETHASONE SODIUM PHOSPHATE 10 MG/ML IJ SOLN
INTRAMUSCULAR | Status: DC | PRN
  Administered 2018-04-23: 10 mg via INTRAVENOUS

## 2018-04-23 MED ORDER — OXYCODONE HCL 5 MG PO TABS: 5.0000 mg | ORAL_TABLET | ORAL | 0 refills | Status: DC | PRN

## 2018-04-23 MED ORDER — CLINDAMYCIN PHOSPHATE 900 MG/50ML IV SOLN
900.0000 mg | INTRAVENOUS | Status: AC
  Administered 2018-04-23: 900 mg via INTRAVENOUS
  Filled 2018-04-23: qty 50

## 2018-04-23 MED ORDER — SUCCINYLCHOLINE CHLORIDE 200 MG/10ML IV SOSY
PREFILLED_SYRINGE | INTRAVENOUS | Status: AC
  Filled 2018-04-23: qty 10

## 2018-04-23 MED ORDER — ASPIRIN EC 81 MG PO TBEC: 81.0000 mg | DELAYED_RELEASE_TABLET | Freq: Every day | ORAL | 3 refills | Status: DC

## 2018-04-23 MED ORDER — HYDROMORPHONE HCL 1 MG/ML IJ SOLN: 0.2500 mg | INTRAMUSCULAR | Status: DC | PRN

## 2018-04-23 MED ORDER — MIDAZOLAM HCL 5 MG/5ML IJ SOLN
INTRAMUSCULAR | Status: DC | PRN
  Administered 2018-04-23: 2 mg via INTRAVENOUS

## 2018-04-23 MED ORDER — ONDANSETRON HCL 4 MG/2ML IJ SOLN: 4.0000 mg | Freq: Four times a day (QID) | INTRAMUSCULAR | Status: DC | PRN

## 2018-04-23 MED ORDER — LIDOCAINE HCL (CARDIAC) PF 100 MG/5ML IV SOSY
PREFILLED_SYRINGE | INTRAVENOUS | Status: DC | PRN
  Administered 2018-04-23: 100 mg via INTRATRACHEAL

## 2018-04-23 MED ORDER — HYDROMORPHONE HCL 1 MG/ML IJ SOLN: 1.0000 mg | INTRAMUSCULAR | Status: DC | PRN

## 2018-04-23 MED ORDER — CEFAZOLIN SODIUM-DEXTROSE 2-4 GM/100ML-% IV SOLN
2.0000 g | Freq: Three times a day (TID) | INTRAVENOUS | Status: AC
  Administered 2018-04-23 (×2): 2 g via INTRAVENOUS
  Filled 2018-04-23 (×2): qty 100

## 2018-04-23 MED ORDER — EPHEDRINE 5 MG/ML INJ
INTRAVENOUS | Status: AC
  Filled 2018-04-23: qty 10

## 2018-04-23 MED ORDER — PHENOL 1.4 % MT LIQD: 1.0000 | OROMUCOSAL | Status: DC | PRN

## 2018-04-23 MED ORDER — METHOCARBAMOL 500 MG PO TABS
500.0000 mg | ORAL_TABLET | Freq: Four times a day (QID) | ORAL | Status: DC | PRN
  Administered 2018-04-23 – 2018-04-24 (×2): 500 mg via ORAL
  Filled 2018-04-23 (×3): qty 1

## 2018-04-23 MED ORDER — PROPOFOL 10 MG/ML IV BOLUS
INTRAVENOUS | Status: DC | PRN
  Administered 2018-04-23: 50 mg via INTRAVENOUS
  Administered 2018-04-23: 150 mg via INTRAVENOUS

## 2018-04-23 MED ORDER — MENTHOL 3 MG MT LOZG: 1.0000 | LOZENGE | OROMUCOSAL | Status: DC | PRN

## 2018-04-23 MED ORDER — OXYCODONE HCL 5 MG PO TABS
5.0000 mg | ORAL_TABLET | ORAL | Status: DC | PRN
  Administered 2018-04-23: 5 mg via ORAL
  Filled 2018-04-23: qty 1

## 2018-04-23 MED ORDER — ONDANSETRON HCL 4 MG/2ML IJ SOLN
INTRAMUSCULAR | Status: DC | PRN
  Administered 2018-04-23: 4 mg via INTRAVENOUS

## 2018-04-23 MED ORDER — PHENYLEPHRINE 40 MCG/ML (10ML) SYRINGE FOR IV PUSH (FOR BLOOD PRESSURE SUPPORT)
PREFILLED_SYRINGE | INTRAVENOUS | Status: AC
  Filled 2018-04-23: qty 10

## 2018-04-23 MED ORDER — DEXAMETHASONE SODIUM PHOSPHATE 10 MG/ML IJ SOLN
INTRAMUSCULAR | Status: AC
  Filled 2018-04-23: qty 1

## 2018-04-23 MED ORDER — THROMBIN 20000 UNITS EX SOLR
CUTANEOUS | Status: DC | PRN
  Administered 2018-04-23: 20 mL via TOPICAL

## 2018-04-23 MED ORDER — FENTANYL CITRATE (PF) 250 MCG/5ML IJ SOLN
INTRAMUSCULAR | Status: AC
  Filled 2018-04-23: qty 5

## 2018-04-23 MED ORDER — ACETAMINOPHEN 10 MG/ML IV SOLN
1000.0000 mg | INTRAVENOUS | Status: AC
  Administered 2018-04-23: 1000 mg via INTRAVENOUS
  Filled 2018-04-23: qty 100

## 2018-04-23 MED ORDER — AMLODIPINE BESYLATE 5 MG PO TABS: 5.0000 mg | ORAL_TABLET | ORAL | Status: DC

## 2018-04-23 MED ORDER — EPHEDRINE SULFATE 50 MG/ML IJ SOLN
INTRAMUSCULAR | Status: DC | PRN
  Administered 2018-04-23 (×4): 10 mg via INTRAVENOUS

## 2018-04-23 MED ORDER — LEVOTHYROXINE SODIUM 88 MCG PO TABS
88.0000 ug | ORAL_TABLET | Freq: Every day | ORAL | Status: DC
  Administered 2018-04-24: 88 ug via ORAL
  Filled 2018-04-23: qty 1

## 2018-04-23 MED ORDER — OXYCODONE HCL 5 MG PO TABS: 5.0000 mg | ORAL_TABLET | Freq: Once | ORAL | Status: DC | PRN

## 2018-04-23 MED ORDER — MAGNESIUM CITRATE PO SOLN: 1.0000 | Freq: Once | ORAL | Status: DC | PRN

## 2018-04-23 MED ORDER — ACETAMINOPHEN 325 MG PO TABS
650.0000 mg | ORAL_TABLET | ORAL | Status: DC | PRN
  Administered 2018-04-24: 650 mg via ORAL
  Filled 2018-04-23: qty 2

## 2018-04-23 MED ORDER — BUPIVACAINE-EPINEPHRINE 0.5% -1:200000 IJ SOLN
INTRAMUSCULAR | Status: AC
  Filled 2018-04-23: qty 1

## 2018-04-23 MED ORDER — POLYETHYLENE GLYCOL 3350 17 G PO PACK: 17.0000 g | PACK | Freq: Every day | ORAL | Status: DC | PRN

## 2018-04-23 MED ORDER — RISAQUAD PO CAPS
1.0000 | ORAL_CAPSULE | Freq: Every day | ORAL | Status: DC
  Administered 2018-04-23: 1 via ORAL
  Filled 2018-04-23 (×2): qty 1

## 2018-04-23 MED ORDER — THROMBIN (RECOMBINANT) 20000 UNITS EX SOLR
CUTANEOUS | Status: AC
  Filled 2018-04-23: qty 20000

## 2018-04-23 MED ORDER — ROCURONIUM BROMIDE 50 MG/5ML IV SOSY
PREFILLED_SYRINGE | INTRAVENOUS | Status: DC | PRN
  Administered 2018-04-23: 50 mg via INTRAVENOUS

## 2018-04-23 MED ORDER — CEFAZOLIN SODIUM-DEXTROSE 2-4 GM/100ML-% IV SOLN
2.0000 g | INTRAVENOUS | Status: AC
  Administered 2018-04-23: 2 g via INTRAVENOUS
  Filled 2018-04-23: qty 100

## 2018-04-23 MED ORDER — METHOCARBAMOL 1000 MG/10ML IJ SOLN
500.0000 mg | Freq: Four times a day (QID) | INTRAVENOUS | Status: DC | PRN
  Filled 2018-04-23: qty 5

## 2018-04-23 MED ORDER — SODIUM CHLORIDE 0.9 % IV SOLN
INTRAVENOUS | Status: DC | PRN
  Administered 2018-04-23: 500 mL

## 2018-04-23 MED ORDER — FENTANYL CITRATE (PF) 100 MCG/2ML IJ SOLN
INTRAMUSCULAR | Status: DC | PRN
  Administered 2018-04-23 (×2): 50 ug via INTRAVENOUS

## 2018-04-23 MED ORDER — PROMETHAZINE HCL 25 MG/ML IJ SOLN: 6.2500 mg | INTRAMUSCULAR | Status: DC | PRN

## 2018-04-23 MED ORDER — ONDANSETRON HCL 4 MG PO TABS: 4.0000 mg | ORAL_TABLET | Freq: Four times a day (QID) | ORAL | Status: DC | PRN

## 2018-04-23 MED ORDER — PROPOFOL 10 MG/ML IV BOLUS
INTRAVENOUS | Status: AC
  Filled 2018-04-23: qty 40

## 2018-04-23 MED ORDER — BISACODYL 5 MG PO TBEC: 5.0000 mg | DELAYED_RELEASE_TABLET | Freq: Every day | ORAL | Status: DC | PRN

## 2018-04-23 MED ORDER — SODIUM CHLORIDE 0.9 % IV SOLN: INTRAVENOUS | Status: DC | PRN

## 2018-04-23 MED ORDER — MIDAZOLAM HCL 2 MG/2ML IJ SOLN
INTRAMUSCULAR | Status: AC
  Filled 2018-04-23: qty 2

## 2018-04-23 MED ORDER — OXYCODONE HCL 5 MG PO TABS
10.0000 mg | ORAL_TABLET | ORAL | Status: DC | PRN
  Filled 2018-04-23: qty 2

## 2018-04-23 MED ORDER — OXYCODONE HCL 5 MG/5ML PO SOLN: 5.0000 mg | Freq: Once | ORAL | Status: DC | PRN

## 2018-04-23 MED ORDER — ACETAMINOPHEN 650 MG RE SUPP: 650.0000 mg | RECTAL | Status: DC | PRN

## 2018-04-23 MED ORDER — DOCUSATE SODIUM 100 MG PO CAPS: 100.0000 mg | ORAL_CAPSULE | Freq: Two times a day (BID) | ORAL | 1 refills | Status: DC | PRN

## 2018-04-23 MED ORDER — SUGAMMADEX SODIUM 200 MG/2ML IV SOLN
INTRAVENOUS | Status: AC
  Filled 2018-04-23: qty 2

## 2018-04-23 MED ORDER — CARVEDILOL 12.5 MG PO TABS
12.5000 mg | ORAL_TABLET | Freq: Two times a day (BID) | ORAL | Status: DC
  Administered 2018-04-23: 12.5 mg via ORAL
  Filled 2018-04-23: qty 1

## 2018-04-23 MED ORDER — IRBESARTAN 300 MG PO TABS
300.0000 mg | ORAL_TABLET | Freq: Every day | ORAL | Status: DC
  Administered 2018-04-23: 300 mg via ORAL
  Filled 2018-04-23 (×2): qty 1

## 2018-04-23 MED ORDER — POLYETHYLENE GLYCOL 3350 17 G PO PACK: 17.0000 g | PACK | Freq: Every day | ORAL | 0 refills | Status: DC

## 2018-04-23 MED ORDER — DOCUSATE SODIUM 100 MG PO CAPS
100.0000 mg | ORAL_CAPSULE | Freq: Two times a day (BID) | ORAL | Status: DC
  Administered 2018-04-23 (×2): 100 mg via ORAL
  Filled 2018-04-23 (×2): qty 1

## 2018-04-23 MED ORDER — ONDANSETRON HCL 4 MG/2ML IJ SOLN
INTRAMUSCULAR | Status: AC
  Filled 2018-04-23: qty 2

## 2018-04-23 MED ORDER — 0.9 % SODIUM CHLORIDE (POUR BTL) OPTIME
TOPICAL | Status: DC | PRN
  Administered 2018-04-23: 1000 mL

## 2018-04-23 MED ORDER — SUCCINYLCHOLINE CHLORIDE 20 MG/ML IJ SOLN
INTRAMUSCULAR | Status: DC | PRN
  Administered 2018-04-23: 100 mg via INTRAVENOUS

## 2018-04-23 MED ORDER — ROCURONIUM BROMIDE 50 MG/5ML IV SOSY
PREFILLED_SYRINGE | INTRAVENOUS | Status: AC
  Filled 2018-04-23: qty 5

## 2018-04-23 MED ORDER — LACTATED RINGERS IV SOLN
INTRAVENOUS | Status: DC
  Administered 2018-04-23: 07:00:00 via INTRAVENOUS

## 2018-04-23 MED ORDER — SUGAMMADEX SODIUM 200 MG/2ML IV SOLN
INTRAVENOUS | Status: DC | PRN
  Administered 2018-04-23: 150 mg via INTRAVENOUS

## 2018-04-23 MED ORDER — PHENYLEPHRINE HCL 10 MG/ML IJ SOLN
INTRAMUSCULAR | Status: DC | PRN
  Administered 2018-04-23 (×3): 80 ug via INTRAVENOUS
  Administered 2018-04-23: 40 ug via INTRAVENOUS

## 2018-04-23 MED ORDER — ACETAMINOPHEN 10 MG/ML IV SOLN: 1000.0000 mg | Freq: Once | INTRAVENOUS | Status: DC | PRN

## 2018-04-23 SURGICAL SUPPLY — 60 items
BAG DECANTER FOR FLEXI CONT (MISCELLANEOUS) ×3 IMPLANT
CLEANER TIP ELECTROSURG 2X2 (MISCELLANEOUS) ×3 IMPLANT
CLOSURE STERI-STRIP 1/2X4 (GAUZE/BANDAGES/DRESSINGS) ×1
CLOSURE WOUND 1/2 X4 (GAUZE/BANDAGES/DRESSINGS) ×1
CLOTH 2% CHLOROHEXIDINE 3PK (PERSONAL CARE ITEMS) ×3 IMPLANT
CLSR STERI-STRIP ANTIMIC 1/2X4 (GAUZE/BANDAGES/DRESSINGS) ×2 IMPLANT
CONT SPEC 4OZ CLIKSEAL STRL BL (MISCELLANEOUS) ×6 IMPLANT
COVER WAND RF STERILE (DRAPES) ×3 IMPLANT
DRAPE LAPAROTOMY 100X72X124 (DRAPES) ×3 IMPLANT
DRAPE MICROSCOPE LEICA (MISCELLANEOUS) ×3 IMPLANT
DRAPE SHEET LG 3/4 BI-LAMINATE (DRAPES) ×3 IMPLANT
DRAPE SURG 17X11 SM STRL (DRAPES) ×3 IMPLANT
DRAPE UTILITY XL STRL (DRAPES) ×3 IMPLANT
DRESSING AQUACEL AG SP 3.5X4 (GAUZE/BANDAGES/DRESSINGS) ×1 IMPLANT
DRSG AQUACEL AG ADV 3.5X 4 (GAUZE/BANDAGES/DRESSINGS) IMPLANT
DRSG AQUACEL AG ADV 3.5X 6 (GAUZE/BANDAGES/DRESSINGS) IMPLANT
DRSG AQUACEL AG SP 3.5X4 (GAUZE/BANDAGES/DRESSINGS) ×3
DRSG TELFA 3X8 NADH (GAUZE/BANDAGES/DRESSINGS) IMPLANT
DURAPREP 26ML APPLICATOR (WOUND CARE) ×3 IMPLANT
DURASEAL SPINE SEALANT 3ML (MISCELLANEOUS) IMPLANT
ELECT BLADE 4.0 EZ CLEAN MEGAD (MISCELLANEOUS) ×3
ELECT REM PT RETURN 9FT ADLT (ELECTROSURGICAL) ×3
ELECTRODE BLDE 4.0 EZ CLN MEGD (MISCELLANEOUS) ×1 IMPLANT
ELECTRODE REM PT RTRN 9FT ADLT (ELECTROSURGICAL) ×1 IMPLANT
GLOVE BIOGEL PI IND STRL 6.5 (GLOVE) ×2 IMPLANT
GLOVE BIOGEL PI IND STRL 7.0 (GLOVE) ×1 IMPLANT
GLOVE BIOGEL PI INDICATOR 6.5 (GLOVE) ×4
GLOVE BIOGEL PI INDICATOR 7.0 (GLOVE) ×2
GLOVE SS BIOGEL STRL SZ 6.5 (GLOVE) ×3 IMPLANT
GLOVE SUPERSENSE BIOGEL SZ 6.5 (GLOVE) ×6
GLOVE SURG SS PI 7.5 STRL IVOR (GLOVE) ×3 IMPLANT
GLOVE SURG SS PI 8.0 STRL IVOR (GLOVE) ×9 IMPLANT
GOWN STRL REUS W/ TWL LRG LVL3 (GOWN DISPOSABLE) ×3 IMPLANT
GOWN STRL REUS W/ TWL XL LVL3 (GOWN DISPOSABLE) ×1 IMPLANT
GOWN STRL REUS W/TWL LRG LVL3 (GOWN DISPOSABLE) ×6
GOWN STRL REUS W/TWL XL LVL3 (GOWN DISPOSABLE) ×3
IV CATH 14GX2 1/4 (CATHETERS) ×3 IMPLANT
KIT BASIN OR (CUSTOM PROCEDURE TRAY) ×3 IMPLANT
KIT POSITION SURG JACKSON T1 (MISCELLANEOUS) ×3 IMPLANT
NEEDLE 22X1 1/2 (OR ONLY) (NEEDLE) ×3 IMPLANT
NEEDLE SPNL 18GX3.5 QUINCKE PK (NEEDLE) ×6 IMPLANT
PACK LAMINECTOMY NEURO (CUSTOM PROCEDURE TRAY) ×3 IMPLANT
PATTIES SURGICAL .75X.75 (GAUZE/BANDAGES/DRESSINGS) ×3 IMPLANT
RUBBERBAND STERILE (MISCELLANEOUS) ×6 IMPLANT
SPONGE LAP 4X18 RFD (DISPOSABLE) IMPLANT
SPONGE SURGIFOAM ABS GEL 100 (HEMOSTASIS) ×3 IMPLANT
STAPLER VISISTAT (STAPLE) IMPLANT
STRIP CLOSURE SKIN 1/2X4 (GAUZE/BANDAGES/DRESSINGS) ×2 IMPLANT
SUT NURALON 4 0 TR CR/8 (SUTURE) IMPLANT
SUT PROLENE 3 0 PS 2 (SUTURE) ×3 IMPLANT
SUT VIC AB 1 CT1 27 (SUTURE)
SUT VIC AB 1 CT1 27XBRD ANTBC (SUTURE) IMPLANT
SUT VIC AB 1-0 CT2 27 (SUTURE) ×3 IMPLANT
SUT VIC AB 2-0 CT1 27 (SUTURE) ×2
SUT VIC AB 2-0 CT1 TAPERPNT 27 (SUTURE) ×1 IMPLANT
SUT VIC AB 2-0 CT2 27 (SUTURE) IMPLANT
SYR 3ML LL SCALE MARK (SYRINGE) ×3 IMPLANT
TOWEL GREEN STERILE (TOWEL DISPOSABLE) ×3 IMPLANT
TOWEL GREEN STERILE FF (TOWEL DISPOSABLE) ×3 IMPLANT
YANKAUER SUCT BULB TIP NO VENT (SUCTIONS) ×3 IMPLANT

## 2018-04-23 NOTE — Brief Op Note (Signed)
04/23/2018  9:05 AM  PATIENT:  Eric [name]  72 y.o. male  PRE-OPERATIVE DIAGNOSIS:  Stenosis HNP  POST-OPERATIVE DIAGNOSIS:  Stenosis HNP  PROCEDURE:  Procedure(s): Microlumbar decompression Lumbar five-Sacral one (N/A)  SURGEON:  Surgeon(s) and Role:    Susa Day, MD - Primary  PHYSICIAN ASSISTANT:   ASSISTANTS: [place]   ANESTHESIA:   general  EBL:  5 mL   BLOOD ADMINISTERED:none  DRAINS: none   LOCAL MEDICATIONS USED:  MARCAINE     SPECIMEN:  Source of Specimen:  L5S1  DISPOSITION OF SPECIMEN:  PATHOLOGY  COUNTS:  YES  TOURNIQUET:  * No tourniquets in log *  DICTATION: .Other Dictation: Dictation Number [id number]  PLAN OF CARE: Admit for overnight observation  PATIENT DISPOSITION:  PACU - hemodynamically stable.   Delay start of Pharmacological VTE agent (>24hrs) due to surgical blood loss or risk of bleeding: yes

## 2018-04-23 NOTE — Transfer of Care (Signed)
Immediate Anesthesia Transfer of Care Note  Patient: Eric [name]  Procedure(s) Performed: Microlumbar decompression Lumbar five-Sacral one (N/A Spine Lumbar)  Patient Location: PACU  Anesthesia Type:General  Level of Consciousness: awake, alert , oriented and sedated  Airway & Oxygen Therapy: Patient Spontanous Breathing and Patient connected to nasal cannula oxygen  Post-op Assessment: Report given to RN, Post -op Vital signs reviewed and stable and Patient moving all extremities  Post vital signs: Reviewed and stable  Last Vitals:  Vitals Value Taken Time  BP 125/68 04/23/2018  9:34 AM  Temp 36.6 C 04/23/2018  9:34 AM  Pulse 67 04/23/2018  9:38 AM  Resp 21 04/23/2018  9:38 AM  SpO2 98 % 04/23/2018  9:38 AM  Vitals shown include unvalidated device data.  Last Pain:  Vitals:   04/23/18 0934  TempSrc:   PainSc: 0-No pain         Complications: No apparent anesthesia complications

## 2018-04-23 NOTE — Anesthesia Postprocedure Evaluation (Signed)
Anesthesia Post Note  Patient: Eric [name]  Procedure(s) Performed: Microlumbar decompression Lumbar five-Sacral one (N/A Spine Lumbar)     Patient location during evaluation: PACU Anesthesia Type: General Level of consciousness: awake and alert Pain management: pain level controlled Vital Signs Assessment: post-procedure vital signs reviewed and stable Respiratory status: spontaneous breathing, nonlabored ventilation and respiratory function stable Cardiovascular status: blood pressure returned to baseline and stable Postop Assessment: no apparent nausea or vomiting Anesthetic complications: no    Last Vitals:  Vitals:   04/23/18 1002 04/23/18 1017  BP: (!) 120/54 114/60  Pulse: 68 67  Resp: (!) 23 (!) 22  Temp:    SpO2: 95% 94%    Last Pain:  Vitals:   04/23/18 1025  TempSrc:   PainSc: 0-No pain                 Brennan Bailey

## 2018-04-23 NOTE — Op Note (Signed)
Eric [name], [name] MEDICAL RECORD [id number] ACCOUNT [id number] DATE OF BIRTH:Aug 28, 1945 FACILITY: [facility] LOCATION: [facility] PHYSICIAN:Shivonne Schwartzman Windy Kalata, MD  OPERATIVE REPORT  DATE OF PROCEDURE:  04/23/2018  PREOPERATIVE DIAGNOSIS:  Spinal stenosis, herniated nucleus pulposus L5-S1, left.  POSTOPERATIVE DIAGNOSIS:  Spinal stenosis, herniated nucleus pulposus L5-S1, left.  PROCEDURE PERFORMED: 1.  Microlumbar decompression L5-S1, left. 2.  Foraminotomies L5-S1, left. 3.  Microdiskectomy L5-S1, left.  ANESTHESIA:  General.  ASSISTANT:  Lacie Draft, PA  SPECIMEN TO PATHOLOGY:  L5-S1 disk.  HISTORY:  He is 72 with S1 radiculopathy secondary to extruded fragment compressing the S1 nerve root, refractory to conservative treatment, indicated for microlumbar decompression with neural tension signs, EHL and plantarflexion weakness and numbness.   Risks and benefits discussed including bleeding, infection, damage to neurovascular structures, no change in symptoms, worsening symptoms, DVT, PE, anesthetic complications, etc.  TECHNIQUE:  With the patient in supine position after induction of adequate general anesthesia and 2 g Kefzol, he was placed prone on the [place] table.  All bony prominences were well padded.  Abdomen free.  Lumbar region was prepped and draped in the  usual sterile fashion.  Two 18-gauge spinal needles were utilized to localize the L5-S1 interspace, confirmed with x-ray.  Incision was made from the spinous process of L5-S1.  Subcutaneous tissue was dissected with cautery to achieve hemostasis.  Dorsal  lumbar fascia was infiltrated with 0.25% Marcaine with epinephrine.  It was divided in line with the skin incision.  Paraspinous muscle elevated from lamina of L5 and S1.  Operating microscope was draped and brought on the surgical field.  After  confirmatory radiograph was obtained, a microcurette was utilized to detach the ligamentum flavum from the cephalad edge of  S1.  With the [facility] 4 and a probe just beneath the lamina, the S1 nerve root was noted to be compressed severely into the  lateral recess and against the lamina.  We then proceeded cephalad above the axilla of the S1 nerve root and removed the ligamentum flavum.  Then, we identified the root, protected it, decompressed the lateral recess to the medial border of the pedicle.   There was a hypertrophic venous plexus noted tethering the root as well.  Disk herniation was extruded from the disk beneath the root out into the foramen, compressing the S1 nerve root against the lamina.  After cauterizing and mobilizing the venous  plexus, I carefully mobilized disk fragments from beneath the S1 nerve root with a microcurette.  Multiple fragments were retrieved.  This was then able to decompress somewhat the nerve root.  With protection with a Woodson retractor, I was then able to  develop a plane between the lamina and the S1 nerve root.  I then performed a generous foraminotomy of S1.  Then, with some mobility and exposure of the nerve root, I gently mobilized it medially, and 2 additional fragments were retrieved from beneath  the thecal sac and the axilla of the root.  We then examined the disk space.  There was a hardened disk material noted from the disk space itself.  We checked out the foramen of the shoulder and axilla of the root at the foramen of L5.  I performed a  foraminotomy as well due to facet hypertrophy to gain access to above the shoulder of the root.  The root was erythematous edematous but intact.  No evidence of CSF leakage or active bleeding.  Confirmatory radiograph obtained with a marker at L5-S1.  A  small piece  of thrombin-soaked Gelfoam was placed just to the lateral recess.  Neuro probe passed freely out the foramen of ____ and S1 and above the pedicle of L5.  I removed the McCulloch retractor, irrigated the paraspinous musculature.  No active  bleeding.  I closed the dorsal lumbar  fascia with #1 Vicryl, subcutaneous with 2-0 and skin with Prolene.  Sterile dressing applied.  Placed supine on the hospital bed, extubated without difficulty and transported to the recovery room in satisfactory  condition.  The patient tolerated the procedure well.  No complications.  Assistant Lacie Draft, [place].  Minimal blood loss.  LN/NUANCE  D:04/23/2018 T:04/23/2018 JOB:[id number]

## 2018-04-23 NOTE — Interval H&P Note (Signed)
History and Physical Interval Note:  04/23/2018 7:02 AM  Eric [name] Cancer  has presented today for surgery, with the diagnosis of Stenosis HNP  The various methods of treatment have been discussed with the patient and family. After consideration of risks, benefits and other options for treatment, the patient has consented to  Procedure(s) with comments: Microlumbar decompression L5-S1 (N/A) - 2 hrs as a surgical intervention .  The patient's history has been reviewed, patient examined, no change in status, stable for surgery.  I have reviewed the patient's chart and labs.  Questions were answered to the patient's satisfaction.     Eric [name] C

## 2018-04-23 NOTE — Anesthesia Procedure Notes (Signed)
Procedure Name: Intubation Date/Time: 04/23/2018 7:46 AM Performed by: Scheryl Darter, CRNA Pre-anesthesia Checklist: Patient identified, Emergency Drugs available, Suction available and Patient being monitored Patient Re-evaluated:Patient Re-evaluated prior to induction Oxygen Delivery Method: Circle System Utilized Preoxygenation: Pre-oxygenation with 100% oxygen Induction Type: IV induction Ventilation: Mask ventilation without difficulty Laryngoscope Size: Miller and 3 Grade View: Grade I Tube type: Oral Tube size: 7.5 mm Number of attempts: 1 Airway Equipment and Method: Stylet Placement Confirmation: ETT inserted through vocal cords under direct vision,  positive ETCO2 and breath sounds checked- equal and bilateral Secured at: 23 cm Tube secured with: Tape Dental Injury: Teeth and Oropharynx as per pre-operative assessment

## 2018-04-23 NOTE — Discharge Instructions (Signed)

## 2018-04-24 ENCOUNTER — Ambulatory Visit (HOSPITAL_COMMUNITY): Payer: Medicare Other

## 2018-04-24 ENCOUNTER — Encounter (HOSPITAL_COMMUNITY): Payer: Self-pay | Admitting: Specialist

## 2018-04-24 DIAGNOSIS — M5127 Other intervertebral disc displacement, lumbosacral region: Secondary | ICD-10-CM | POA: Diagnosis not present

## 2018-04-24 LAB — BASIC METABOLIC PANEL
Anion gap: 9 (ref 5–15)
BUN: 12 mg/dL (ref 8–23)
CO2: 24 mmol/L (ref 22–32)
CREATININE: 1.16 mg/dL (ref 0.61–1.24)
Calcium: 9.3 mg/dL (ref 8.9–10.3)
Chloride: 102 mmol/L (ref 98–111)
GFR calc Af Amer: >60 mL/min (ref >60)
Glucose, Bld: 144 mg/dL — ABNORMAL HIGH (ref 70–99)
POTASSIUM: 4.1 mmol/L (ref 3.5–5.1)
Sodium: 135 mmol/L (ref 135–145)

## 2018-04-24 MED ORDER — IBUPROFEN 200 MG PO TABS: 400.0000 mg | ORAL_TABLET | Freq: Three times a day (TID) | ORAL | 0 refills | Status: DC | PRN

## 2018-04-24 MED FILL — Thrombin (Recombinant) For Soln 20000 Unit: CUTANEOUS | Qty: 1 | Status: AC

## 2018-04-24 NOTE — Discharge Summary (Signed)
Physician Discharge Summary   Patient ID: Eric [name] MRN: [id number] DOB/AGE: January 06, 1946 72 y.o.  Admit date: 04/23/2018 Discharge date: 04/24/2018  Primary Diagnosis:   Stenosis HNP  Admission Diagnoses:  Past Medical History:  Diagnosis Date  . Abnormal LFTs   . Anxiety   . Aortic stenosis 04/15/2011  . Dizziness 04/21/2014  . Dyspnea   . Heart murmur   . HNP (herniated nucleus pulposus), lumbar    L5-S1  . Hyperglycemia 10/20/2014  . Hyperlipidemia   . Hypertension   . Hypothyroidism   . Left bundle branch block (LBBB) 12/18/2017  . Lumbar stenosis   . Macular edema    Dr  Sherlynn Stalls   . 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 12/17/2017   Edwards Intuity Elite rapid deployment stented bovine pericardial tissue valve via right mini thoracotomy approach   Discharge Diagnoses:   Principal Problem:   Spinal stenosis of lumbar region Active Problems:   HNP (herniated nucleus pulposus), lumbar  Procedure:  Procedure(s) (LRB): Microlumbar decompression Lumbar five-Sacral one (N/A)   Consults: None  HPI:  see H&P    Laboratory Data: Hospital Outpatient Visit on 04/14/2018  Component Date Value Ref Range Status  . Sodium 04/14/2018 139  135 - 145 mmol/L Final  . Potassium 04/14/2018 3.7  3.5 - 5.1 mmol/L Final  . Chloride 04/14/2018 106  98 - 111 mmol/L Final  . CO2 04/14/2018 27  22 - 32 mmol/L Final  . Glucose, Bld 04/14/2018 85  70 - 99 mg/dL Final  . BUN 04/14/2018 12  8 - 23 mg/dL Final  . Creatinine, Ser 04/14/2018 0.96  0.61 - 1.24 mg/dL Final  . Calcium 04/14/2018 9.4  8.9 - 10.3 mg/dL Final  . GFR calc non Af Amer 04/14/2018 >60  >60 mL/min Final  . GFR calc Af Amer 04/14/2018 >60  >60 mL/min Final   Comment: (NOTE) The eGFR has been calculated using the CKD EPI equation. This calculation has not been validated in all clinical situations. eGFR's persistently <60 mL/min signify possible Chronic  Kidney Disease.   Georgiann Hahn gap 04/14/2018 6  5 - 15 Final   Performed at [facility], [place] [address] [place], [place] [address]  . WBC 04/14/2018 5.5  4.0 - 10.5 K/uL Final  . RBC 04/14/2018 4.89  4.22 - 5.81 MIL/uL Final  . Hemoglobin 04/14/2018 13.5  13.0 - 17.0 g/dL Final  . HCT 04/14/2018 43.3  39.0 - 52.0 % Final  . MCV 04/14/2018 88.5  80.0 - 100.0 fL Final  . MCH 04/14/2018 27.6  26.0 - 34.0 pg Final  . MCHC 04/14/2018 31.2  30.0 - 36.0 g/dL Final  . RDW 04/14/2018 15.3  11.5 - 15.5 % Final  . Platelets 04/14/2018 248  150 - 400 K/uL Final  . nRBC 04/14/2018 0.0  0.0 - 0.2 % Final   Performed at [facility], [place] [address] [place], [place] [address]  . MRSA, PCR 04/14/2018 NEGATIVE  NEGATIVE Final  . Staphylococcus aureus 04/14/2018 POSITIVE* NEGATIVE Final   Comment: (NOTE) The Xpert SA Assay (FDA approved for NASAL specimens in patients 75 years of age and older), is one component of a comprehensive surveillance program. It is not intended to diagnose infection nor to guide or monitor treatment.    No results for input(s): HGB in the last 72 hours. No results for input(s): WBC, RBC, HCT, PLT in the last 72 hours. Recent Labs    04/24/18  0639  NA 135  K 4.1  CL 102  CO2 24  BUN 12  CREATININE 1.16  GLUCOSE 144*  CALCIUM 9.3   No results for input(s): LABPT, INR in the last 72 hours.  X-Rays:Dg Lumbar Spine 2-3 Views  Result Date: 04/14/2018 CLINICAL DATA:  72 year old male. Pre-admission for lumbar surgery 04/23/2018. Initial encounter. EXAM: LUMBAR SPINE - 2-3 VIEW COMPARISON:  11/10/2017 CT. FINDINGS: Mild curvature lumbar spine convex left. Last fully open disc space labeled L5-S1. Very mild L1-2 disc space narrowing. Moderate L2-3 disc space narrowing greater on right. Mild L3-4 disc space narrowing greater on the right. Minimal narrowing posterior aspect L4-5 disc space. Very mild L5-S1 disc space narrowing. Mild facet degenerative changes L4-5  and L5-S1. Mild degenerative changes lower thoracic spine. Vascular calcifications.  Caliber of aorta incompletely assessed. : 1. Mild curvature lumbar spine convex left. 2. Last fully open disc space labeled L5-S1. Correlation with any outside MR or postmyelogram CT recommended to confirm this was the level assignment utilized at such time. 3. Degenerative changes lower thoracic and lumbar spine as detailed above. 4.  Aortic Atherosclerosis (ICD10-I70.0). Electronically Signed   By: Genia Del M.D.   On: 04/14/2018 18:15   Dg Lumbar Spine Complete  Result Date: 04/23/2018 CLINICAL DATA:  Lumbar spine surgery. EXAM: LUMBAR SPINE - COMPLETE 4+ VIEW COMPARISON:  04/14/2018. FINDINGS: Lumbar spine numbered as per prior exam. On image number 4 metallic marker noted posteriorly at L5-S1. IMPRESSION: Metallic marker noted posteriorly at L5-S1 on image 4. Electronically Signed   By: Marcello Moores  Register   On: 04/23/2018 11:26    EKG: Orders placed or performed in visit on 01/13/18  . EKG 12-Lead     Hospital Course: Patient was admitted to [facility] and taken to the OR and underwent the above state procedure without complications.  Patient tolerated the procedure well and was later transferred to the recovery room and then to the orthopaedic floor for postoperative care.  They were given PO and IV analgesics for pain control following their surgery.  They were given 24 hours of postoperative antibiotics.   PT was consulted postop to assist with mobility and transfers.  The patient was allowed to be WBAT with therapy and was taught back precautions. Discharge planning was consulted to help with postop disposition and equipment needs.  Patient had a good night on the evening of surgery and started to get up OOB with therapy on day one. Patient was seen in rounds and was ready to go home on day one.  They were given discharge instructions and dressing directions.  They were instructed on when to follow  up in the office with Dr. Tonita Cong.   Diet: Regular diet Activity:WBAT, LSpine precautions Follow-up:in 10-14 days Disposition - Home Discharged Condition: good   Discharge Instructions    Call MD / Call 911   Complete by:  As directed    If you experience chest pain or shortness of breath, CALL 911 and be transported to the hospital emergency room.  If you develope a fever above 101 F, pus (white drainage) or increased drainage or redness at the wound, or calf pain, call your surgeon's office.   Constipation Prevention   Complete by:  As directed    Drink plenty of fluids.  Prune juice may be helpful.  You may use a stool softener, such as Colace (over the counter) 100 mg twice a day.  Use MiraLax (over the counter) for constipation as needed.  Diet - low sodium heart healthy   Complete by:  As directed    Increase activity slowly as tolerated   Complete by:  As directed      Allergies as of 04/24/2018      Reactions   Atorvastatin Other (See Comments)   REACTION: myalgia   Ezetimibe Other (See Comments)   REACTION: myalgia      Medication List    STOP taking these medications   HYDROcodone-acetaminophen 5-325 MG tablet Commonly known as:  NORCO/VICODIN   rosuvastatin 20 MG tablet Commonly known as:  CRESTOR     TAKE these medications   amLODipine 5 MG tablet Commonly known as:  NORVASC Take 1 tablet (5 mg total) by mouth every other day.   aspirin EC 81 MG tablet Take 1 tablet (81 mg total) by mouth daily. Resume 5 days postop What changed:  additional instructions   carvedilol 12.5 MG tablet Commonly known as:  COREG Take 1 tablet (12.5 mg total) by mouth 2 (two) times daily with a meal.   docusate sodium 100 MG capsule Commonly known as:  COLACE Take 1 capsule (100 mg total) by mouth 2 (two) times daily as needed for mild constipation.   ibuprofen 200 MG tablet Commonly known as:  ADVIL,MOTRIN Take 2 tablets (400 mg total) by mouth every 8 (eight) hours  as needed for headache or moderate pain. May resume 5 days post-op as needed What changed:  additional instructions   levothyroxine 88 MCG tablet Commonly known as:  SYNTHROID, LEVOTHROID Take 1 tablet (88 mcg total) by mouth daily before breakfast.   oxyCODONE 5 MG immediate release tablet Commonly known as:  Oxy IR/ROXICODONE Take 1-2 tablets (5-10 mg total) by mouth every 4 (four) hours as needed for severe pain.   polyethylene glycol packet Commonly known as:  MIRALAX / GLYCOLAX Take 17 g by mouth daily.   telmisartan 80 MG tablet Commonly known as:  MICARDIS Take 0.5 tablets (40 mg total) by mouth daily.      Follow-up Information    Susa Day, MD Follow up in 2 week(s).   Specialty:  Orthopedic Surgery Contact information: [address] [place] [address] [phone]           Signed: Lacie Draft, PA-C Orthopaedic Surgery 04/24/2018, 8:02 AM

## 2018-04-24 NOTE — Evaluation (Signed)
Occupational Therapy Evaluation and Discharge Patient Details Name: Eric [name] MRN: [id number] DOB: 01/14/1946 Today's Date: 04/24/2018    History of Present Illness s/p microlumbar decompression L5-S1   Clinical Impression   Pt demonstrating ability to perform ADL and ADL transfers with set up to min assist. Will have wife available to assist as needed. All education completed with pt verbalizing and/or demonstrating understanding. No further OT needs.    Follow Up Recommendations  No OT follow up    Equipment Recommendations  None recommended by OT    Recommendations for Other Services       Precautions / Restrictions Precautions Precautions: Back Precaution Booklet Issued: Yes (comment) Precaution Comments: educated in back precautions related to ADL and IADL Restrictions Weight Bearing Restrictions: No      Mobility Bed Mobility               General bed mobility comments: reinforced log roll technique  Transfers Overall transfer level: Modified independent Equipment used: None                  Balance                                           ADL either performed or assessed with clinical judgement   ADL Overall ADL's : Needs assistance/impaired Eating/Feeding: Independent;Sitting   Grooming: Wash/dry hands;Oral care;Standing;Modified independent Grooming Details (indicate cue type and reason): educated in two cup method for brushing teeth Upper Body Bathing: Modified independent;Standing Upper Body Bathing Details (indicate cue type and reason): recommended using long handled bath sponge for back Lower Body Bathing: Modified independent;Sit to/from stand Lower Body Bathing Details (indicate cue type and reason): recommended use of long handled bath sponge Upper Body Dressing : Set up;Sitting   Lower Body Dressing: Minimal assistance;Sit to/from stand   Toilet Transfer: Modified Independent;Ambulation;Comfort height  toilet   Toileting- Clothing Manipulation and Hygiene: Modified independent;Sitting/lateral lean Toileting - Clothing Manipulation Details (indicate cue type and reason): instructed to avoid twisting for pericare, use of tongs     Functional mobility during ADLs: Modified independent(within room)       Vision Baseline Vision/History: Wears glasses Wears Glasses: At all times Patient Visual Report: No change from baseline       Perception     Praxis      Pertinent Vitals/Pain Pain Assessment: Faces Faces Pain Scale: Hurts a little bit Pain Location: back Pain Descriptors / Indicators: Operative site guarding Pain Intervention(s): Monitored during session;Repositioned     Hand Dominance Right   Extremity/Trunk Assessment Upper Extremity Assessment Upper Extremity Assessment: Overall WFL for tasks assessed   Lower Extremity Assessment Lower Extremity Assessment: Defer to PT evaluation       Communication Communication Communication: No difficulties   Cognition Arousal/Alertness: Awake/alert Behavior During Therapy: WFL for tasks assessed/performed Overall Cognitive Status: Within Functional Limits for tasks assessed                                     General Comments       Exercises     Shoulder Instructions      Home Living Family/patient expects to be discharged to:: Private residence Living Arrangements: Spouse/significant other Available Help at Discharge: Family;Available 24 hours/day Type of Home: House Home Access: Level entry  Home Layout: One level     Bathroom Shower/Tub: Tub/shower unit;Curtain   Biochemist, clinical: Standard     Home Equipment: Environmental consultant - 2 wheels;Bedside commode;Shower seat          Prior Functioning/Environment Level of Independence: Independent        Comments: ADLs, IADLs, and driving. 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: None Help from another person to put on and taking off regular upper body clothing?: None Help from another person to put on and taking off regular lower body clothing?: A Little 6 Click Score: 23   End of Session Equipment Utilized During Treatment: Gait belt  Activity Tolerance: Patient tolerated treatment well Patient left: in bed;with call bell/phone within reach;with family/visitor present  OT Visit Diagnosis: Pain                Time: 8295-6213 OT Time Calculation (min): 39 min Charges:  OT General Charges $OT Visit: 1 Visit OT Evaluation $OT Eval Low Complexity: 1 Low  Malka So 04/24/2018, 9:34 AM  Nestor Lewandowsky, OTR/L Acute Rehabilitation Services Pager: [phone] Office: [phone]

## 2018-04-24 NOTE — Progress Notes (Signed)
Patient is discharged from room 3C10 at this time. Alert and in stable condition. IV site d/c'd and instructions read to patient and spouse with understanding verbalized. Left unit via wheelchair with all belongings at side.  

## 2018-04-24 NOTE — Evaluation (Signed)
Physical Therapy Evaluation and Discharge Patient Details Name: Eric [name] MRN: [id number] DOB: April 28, 1946 Today's Date: 04/24/2018   History of Present Illness  Pt is a 72 y/o male who presents s/p microlumbar decompression L5-S1 on 04/23/18.  Clinical Impression  Patient evaluated by Physical Therapy with no further acute PT needs identified. All education has been completed and the patient has no further questions. At the time of PT eval pt was able to perform transfers and ambulation with gross modified independence without an AD. Pt and wife were educated on precautions, car transfer, and safe activity progression. See below for any follow-up Physical Therapy or equipment needs. PT is signing off. Thank you for this referral.     Follow Up Recommendations No PT follow up;Supervision for mobility/OOB    Equipment Recommendations    None recommended by PT   Recommendations for Other Services       Precautions / Restrictions Precautions Precautions: Back Precaution Booklet Issued: Yes (comment) Precaution Comments: educated in back precautions related to functional mobility Restrictions Weight Bearing Restrictions: No      Mobility  Bed Mobility               General bed mobility comments: Verbally reviewed log roll technique. Pt states he has practiced with HOB flat and without use of rails, and completed without difficulty.   Transfers Overall transfer level: Modified independent Equipment used: None             General transfer comment: Pt demonstrated proper hand placement on seated surface for safety. No assist required.   Ambulation/Gait Ambulation/Gait assistance: Modified independent (Device/Increase time) Gait Distance (Feet): 400 Feet Assistive device: None Gait Pattern/deviations: Step-through pattern;Decreased stride length;Trunk flexed Gait velocity: Decreased Gait velocity interpretation: 1.31 - 2.62 ft/sec, indicative of limited community  ambulator General Gait Details: Pt was able to maintain upright posture with good step/stride length and no unsteadiness or LOB noted.   Stairs            Wheelchair Mobility    Modified Rankin (Stroke Patients Only)       Balance Overall balance assessment: Needs assistance Sitting-balance support: Feet supported;No upper extremity supported Sitting balance-Leahy Scale: Good     Standing balance support: No upper extremity supported;During functional activity Standing balance-Leahy Scale: Fair                               Pertinent Vitals/Pain Pain Assessment: Faces Faces Pain Scale: Hurts a little bit Pain Location: back Pain Descriptors / Indicators: Operative site guarding Pain Intervention(s): Monitored during session    Home Living Family/patient expects to be discharged to:: Private residence Living Arrangements: Spouse/significant other Available Help at Discharge: Family;Available 24 hours/day Type of Home: House Home Access: Level entry     Home Layout: One level Home Equipment: Walker - 2 wheels;Bedside commode;Shower seat      Prior Function Level of Independence: Independent         Comments: ADLs, IADLs, and driving. 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   Dominant Hand: Right    Extremity/Trunk Assessment   Upper Extremity Assessment Upper Extremity Assessment: Overall WFL for tasks assessed    Lower Extremity Assessment Lower Extremity Assessment: Overall WFL for tasks assessed       Communication   Communication: No difficulties  Cognition Arousal/Alertness: Awake/alert Behavior During Therapy: WFL for tasks assessed/performed Overall Cognitive Status: Within  Functional Limits for tasks assessed                                        General Comments      Exercises     Assessment/Plan    PT Assessment Patent does not need any further PT services  PT Problem List  Decreased strength;Decreased activity tolerance;Decreased balance;Decreased mobility;Decreased knowledge of use of DME;Decreased safety awareness;Decreased knowledge of precautions;Pain       PT Treatment Interventions      PT Goals (Current goals can be found in the Care Plan section)  Acute Rehab PT Goals Patient Stated Goal: return to tinkering with cars PT Goal Formulation: All assessment and education complete, DC therapy    Frequency     Barriers to discharge        Co-evaluation               AM-PAC PT "6 Clicks" Daily Activity  Outcome Measure Difficulty turning over in bed (including adjusting bedclothes, sheets and blankets)?: None Difficulty moving from lying on back to sitting on the side of the bed? : None Difficulty sitting down on and standing up from a chair with arms (e.g., wheelchair, bedside commode, etc,.)?: None Help needed moving to and from a bed to chair (including a wheelchair)?: None Help needed walking in hospital room?: None Help needed climbing 3-5 steps with a railing? : None 6 Click Score: 24    End of Session Equipment Utilized During Treatment: Gait belt Activity Tolerance: Patient tolerated treatment well Patient left: with call bell/phone within reach;with family/visitor present(Sitting EOB awaiting OT) Nurse Communication: Mobility status PT Visit Diagnosis: Pain Pain - part of body: (back)    Time: 7858-8502 PT Time Calculation (min) (ACUTE ONLY): 23 min   Charges:   PT Evaluation $PT Eval Low Complexity: 1 Low PT Treatments $Gait Training: 8-22 mins        Rolinda Roan, PT, DPT Acute Rehabilitation Services Pager: [phone] Office: [phone]   Thelma Comp 04/24/2018, 12:17 PM

## 2018-04-24 NOTE — Progress Notes (Signed)
Subjective: 1 Day Post-Op Procedure(s) (LRB): Microlumbar decompression Lumbar five-Sacral one (N/A) Patient reports pain as mild.  Reports leg pain resolved. Minimal back soreness. No N/V, CP, SOB. Voiding without difficulty. Feels ready to go home.  Objective: Vital signs in last 24 hours: Temp:  [97.9 F (36.6 C)-98.2 F (36.8 C)] 98 F (36.7 C) (11/15 0727) Pulse Rate:  [60-84] 78 (11/15 0727) Resp:  [9-23] 18 (11/15 0727) BP: (114-134)/(54-80) 121/64 (11/15 0727) SpO2:  [93 %-100 %] 96 % (11/15 0727)  Intake/Output from previous day: 11/14 0701 - 11/15 0700 In: 2013.4 [I.V.:1717.8; IV Piggyback:295.6] Out: 5 [Blood:5] Intake/Output this shift: No intake/output data recorded.  No results for input(s): HGB in the last 72 hours. No results for input(s): WBC, RBC, HCT, PLT in the last 72 hours. Recent Labs    04/24/18 0639  NA 135  K 4.1  CL 102  CO2 24  BUN 12  CREATININE 1.16  GLUCOSE 144*  CALCIUM 9.3   No results for input(s): LABPT, INR in the last 72 hours.  Neurologically intact ABD soft Neurovascular intact Sensation intact distally Intact pulses distally Dorsiflexion/Plantar flexion intact Incision: dressing C/D/I and no drainage No cellulitis present Compartment soft  Assessment/Plan: 1 Day Post-Op Procedure(s) (LRB): Microlumbar decompression Lumbar five-Sacral one (N/A) Advance diet Up with therapy D/C IV fluids Discussed intra-op findings Discussed Lspine precautions, dressing instructions, D/C instructions D/C home today Will discuss with Dr. Mliss Fritz, Jishnu Jenniges M. 04/24/2018, 8:00 AM

## 2018-04-27 ENCOUNTER — Ambulatory Visit (HOSPITAL_COMMUNITY): Payer: Medicare Other

## 2018-04-29 ENCOUNTER — Ambulatory Visit (HOSPITAL_COMMUNITY): Payer: Medicare Other

## 2018-05-01 ENCOUNTER — Ambulatory Visit (HOSPITAL_COMMUNITY): Payer: Medicare Other

## 2018-05-04 ENCOUNTER — Ambulatory Visit (HOSPITAL_COMMUNITY): Payer: Medicare Other

## 2018-05-06 ENCOUNTER — Ambulatory Visit (HOSPITAL_COMMUNITY): Payer: Medicare Other

## 2018-05-08 ENCOUNTER — Ambulatory Visit (HOSPITAL_COMMUNITY): Payer: Medicare Other

## 2018-05-11 ENCOUNTER — Ambulatory Visit (HOSPITAL_COMMUNITY): Payer: Medicare Other

## 2018-05-12 ENCOUNTER — Ambulatory Visit (INDEPENDENT_AMBULATORY_CARE_PROVIDER_SITE_OTHER): Payer: Medicare Other | Admitting: Internal Medicine

## 2018-05-12 ENCOUNTER — Encounter: Payer: Self-pay | Admitting: Internal Medicine

## 2018-05-12 VITALS — BP 122/84 | HR 96 | Temp 98.6°F | Resp 16 | Ht 68.0 in | Wt 183.4 lb

## 2018-05-12 DIAGNOSIS — E038 Other specified hypothyroidism: Secondary | ICD-10-CM

## 2018-05-12 DIAGNOSIS — I350 Nonrheumatic aortic (valve) stenosis: Secondary | ICD-10-CM

## 2018-05-12 DIAGNOSIS — Z0000 Encounter for general adult medical examination without abnormal findings: Secondary | ICD-10-CM | POA: Diagnosis not present

## 2018-05-12 DIAGNOSIS — E785 Hyperlipidemia, unspecified: Secondary | ICD-10-CM

## 2018-05-12 MED ORDER — ROSUVASTATIN CALCIUM 5 MG PO TABS: 5.0000 mg | ORAL_TABLET | Freq: Every day | ORAL | 1 refills | Status: DC

## 2018-05-12 NOTE — Patient Instructions (Addendum)
Please schedule Medicare Wellness with Glenard Haring.     GO TO THE FRONT DESK Schedule your next appointment for a checkup in 3 months  ===  Rest, fluids , tylenol  For cough:  Take Mucinex DM twice a day as needed until better  For nasal congestion: Use OTC  Flonase : 2 nasal sprays on each side of the nose in the morning until you feel better Use ASTELIN a prescribed spray : 2 nasal sprays on each side of the nose at night until you feel better   Avoid decongestants such as  Pseudoephedrine or phenylephrine   Call if not gradually better over the next  5 days  Call anytime if the symptoms are severe

## 2018-05-12 NOTE — Assessment & Plan Note (Addendum)
-  Td 2012; Pneumonia shot 2013;  Prevnar: 2015;  zostavax:2016; shingrix : will hold for now; had a flu shot -CCS: per patient: Cscope x 2, last 04-2006, cscope 12- 2017, 3 years per GI -prostate ca screening: DRE - psa 02-2018 wnl -Labs:   Reviewed, none indicated today -diet and exercise: counseled

## 2018-05-12 NOTE — Assessment & Plan Note (Signed)
HTN: Continue amlodipine, carvedilol, Micardis.  Last BMP satisfactory Hyperlipidemia: The patient used to take Crestor 20 mg, he hold it for 2 weeks after recent back surgery, when he went back on it generalized aches and pains resurface.  We agreed to try a lower dose of Crestor, 5 mg daily, Rx sent.  We will reassess his cholesterol when he comes back. Hypothyroidism: Last TSH therapeutic, recheck on RTC Back surgery, 04/23/2018, recuperating well, not needing pain medication. URI: Recommend conservative treatment, see AVS RTC 3 months fasting

## 2018-05-12 NOTE — Progress Notes (Signed)
Pre visit review using our clinic review tool, if applicable. No additional management support is needed unless otherwise documented below in the visit note. 

## 2018-05-12 NOTE — Progress Notes (Signed)
Subjective:    Patient ID: Eric [name], male    DOB: Aug 04, 1945, 72 y.o.   MRN: [id number]  DOS:  05/12/2018 Type of visit - description : cpx He had back surgery recently, seems to be recovering well, not needing pain medication Developed a cold last week, still have some cough and sinus congestion.  No fever   Review of Systems  Other than above, a 14 point review of systems is negative    Past Medical History:  Diagnosis Date  . Abnormal LFTs   . Anxiety   . Aortic stenosis 04/15/2011  . Dizziness 04/21/2014  . Dyspnea   . Heart murmur   . HNP (herniated nucleus pulposus), lumbar    L5-S1  . Hyperglycemia 10/20/2014  . Hyperlipidemia   . Hypertension   . Hypothyroidism   . Left bundle branch block (LBBB) 12/18/2017  . Lumbar stenosis   . Macular edema    Dr  Sherlynn Stalls   . 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 AORTIC VALVE REPLACEMENT N/A 12/17/2017   Procedure: MINIMALLY INVASIVE AORTIC VALVE REPLACEMENT (AVR) [ using Edwards Intuity Valve Size 66mm;  Surgeon: Rexene Alberts, MD;  Location: [place];  Service: Open Heart Surgery;  Laterality: N/A;  MINI THORACOTOMY  . CARDIAC CATHETERIZATION  11/10/00  . CATARACT EXTRACTION W/ INTRAOCULAR LENS IMPLANT     RIGHT EYE  . CHOLECYSTECTOMY    . EYE SURGERY     Right eye cataract  . INGUINAL HERNIA REPAIR     (B)  . LUMBAR LAMINECTOMY/DECOMPRESSION MICRODISCECTOMY N/A 04/23/2018   Procedure: Microlumbar decompression Lumbar five-Sacral one;  Surgeon: Susa Day, MD;  Location: [place];  Service: Orthopedics;  Laterality: N/A;  . POLYPECTOMY    . RIGHT/LEFT HEART CATH AND CORONARY ANGIOGRAPHY N/A 10/16/2017   Procedure: RIGHT/LEFT HEART CATH AND CORONARY ANGIOGRAPHY;  Surgeon: [name], MD;   Location: [place] [facility];  Service: Cardiovascular;  Laterality: N/A;  . TEE WITHOUT CARDIOVERSION N/A 12/17/2017   Procedure: TRANSESOPHAGEAL ECHOCARDIOGRAM (TEE);  Surgeon: Rexene Alberts, MD;  Location: [place];  Service: Open Heart Surgery;  Laterality: N/A;  . TONSILLECTOMY     Family History  Problem Relation Age of Onset  . Diabetes Mother   . Stroke Mother   . Diabetes Brother        ?  . Coronary artery disease Brother        Male 1st degree relative >50, had CABG in his 73s  . Emphysema Father   . COPD Father   . Colon cancer Neg Hx   . Prostate cancer Neg Hx   . Colon polyps Neg Hx   . Esophageal cancer Neg Hx   . Rectal cancer Neg Hx   . Stomach cancer Neg Hx       Social History   Socioeconomic History  . Marital status: Married    Spouse name: Not on file  . Number of children: 3  . Years of education: Not on file  . Highest education level: Not on file  Occupational History  . Occupation: retired- used to work for [employer]: 05/2008  Social Needs  . Financial resource strain: Not on file  . Food insecurity:    Worry: Not on file    Inability:  Not on file  . Transportation needs:    Medical: Not on file    Non-medical: Not on file  Tobacco Use  . Smoking status: Former Smoker    Last attempt to quit: 04/15/1990    Years since quitting: 28.0  . Smokeless tobacco: Never Used  . Tobacco comment: used to smoke 2 ppd  Substance and Sexual Activity  . Alcohol use: Not Currently    Comment: no ETOH since the 90s  . Drug use: No  . Sexual activity: Not on file  Lifestyle  . Physical activity:    Days per week: Not on file    Minutes per session: Not on file  . Stress: Not on file  Relationships  . Social connections:    Talks on phone: Not on file    Gets together: Not on file    Attends religious service: Not on file    Active member of club or organization: Not on file    Attends meetings of clubs or organizations: Not on file     Relationship status: Not on file  . Intimate partner violence:    Fear of current or ex partner: Not on file    Emotionally abused: Not on file    Physically abused: Not on file    Forced sexual activity: Not on file  Other Topics Concern  . Not on file  Social History Narrative   Lives w/ wife   3 children, 8 g-kids                Allergies as of 05/12/2018      Reactions   Atorvastatin Other (See Comments)   REACTION: myalgia   Ezetimibe Other (See Comments)   REACTION: myalgia      Medication List        Accurate as of 05/12/18  6:41 PM. Always use your most recent med list.          amLODipine 5 MG tablet Commonly known as:  NORVASC Take 1 tablet (5 mg total) by mouth every other day.   aspirin EC 81 MG tablet Take 1 tablet (81 mg total) by mouth daily. Resume 5 days postop   carvedilol 12.5 MG tablet Commonly known as:  COREG Take 1 tablet (12.5 mg total) by mouth 2 (two) times daily with a meal.   ibuprofen 200 MG tablet Commonly known as:  ADVIL,MOTRIN Take 2 tablets (400 mg total) by mouth every 8 (eight) hours as needed for headache or moderate pain. May resume 5 days post-op as needed   levothyroxine 88 MCG tablet Commonly known as:  SYNTHROID, LEVOTHROID Take 1 tablet (88 mcg total) by mouth daily before breakfast.   oxyCODONE 5 MG immediate release tablet Commonly known as:  Oxy IR/ROXICODONE Take 1-2 tablets (5-10 mg total) by mouth every 4 (four) hours as needed for severe pain.   rosuvastatin 5 MG tablet Commonly known as:  CRESTOR Take 1 tablet (5 mg total) by mouth daily.   telmisartan 80 MG tablet Commonly known as:  MICARDIS Take 0.5 tablets (40 mg total) by mouth daily.           Objective:   Physical Exam BP 122/84 (BP Location: Left Arm, Patient Position: Sitting, Cuff Size: Small)   Pulse 96   Temp 98.6 F (37 C) (Oral)   Resp 16   Ht 5\' 8"  (1.727 m)   Wt 183 lb 6 oz (83.2 kg)   SpO2 96%   BMI 27.88 kg/m  General: Well developed, NAD, BMI noted Neck: No  thyromegaly  HEENT:  Normocephalic . Face symmetric, atraumatic.  TMs normal.  Nose is slightly congested, sinuses no TTP Lungs:  CTA B Normal respiratory effort, no intercostal retractions, no accessory muscle use. Heart: RRR,  no murmur.  No pretibial edema bilaterally  Abdomen:  Not distended, soft, non-tender. No rebound or rigidity.   Skin: Exposed areas without rash. Not pale. Not jaundice Neurologic:  alert & oriented X3.  Speech normal, gait appropriate for age and unassisted Strength symmetric and appropriate for age.  Psych: Cognition and judgment appear intact.  Cooperative with normal attention span and concentration.  Behavior appropriate. No anxious or depressed appearing.     Assessment & Plan:    Assessment HTN Hyperlipidemia Hypothyroidism Increase LFTs Ultrasound 2002 done for increased LFTs: Normal liver; Hep C negative 02-16-15 Aortic stenosis , dx  2012 , s/p AoVR 12/2017 Macular degeneration, cataracts  +FH CAD  --Pt's Brother has CAD dx age 36s --Patient himself has a cardiac catheterization with minimal to no disease 10/2017  PLAN HTN: Continue amlodipine, carvedilol, Micardis.  Last BMP satisfactory Hyperlipidemia: The patient used to take Crestor 20 mg, he hold it for 2 weeks after recent back surgery, when he went back on it generalized aches and pains resurface.  We agreed to try a lower dose of Crestor, 5 mg daily, Rx sent.  We will reassess his cholesterol when he comes back. Hypothyroidism: Last TSH therapeutic, recheck on RTC Back surgery, 04/23/2018, recuperating well, not needing pain medication. URI: Recommend conservative treatment, see AVS RTC 3 months fasting

## 2018-05-13 ENCOUNTER — Ambulatory Visit (HOSPITAL_COMMUNITY): Payer: Medicare Other

## 2018-05-15 ENCOUNTER — Ambulatory Visit (HOSPITAL_COMMUNITY): Payer: Medicare Other

## 2018-05-18 ENCOUNTER — Ambulatory Visit (HOSPITAL_COMMUNITY): Payer: Medicare Other

## 2018-05-20 ENCOUNTER — Ambulatory Visit (HOSPITAL_COMMUNITY): Payer: Medicare Other

## 2018-05-22 ENCOUNTER — Ambulatory Visit (HOSPITAL_COMMUNITY): Payer: Medicare Other

## 2018-05-25 ENCOUNTER — Ambulatory Visit (HOSPITAL_COMMUNITY): Payer: Medicare Other

## 2018-05-27 ENCOUNTER — Ambulatory Visit (HOSPITAL_COMMUNITY): Payer: Medicare Other

## 2018-05-29 ENCOUNTER — Ambulatory Visit (HOSPITAL_COMMUNITY): Payer: Medicare Other

## 2018-06-01 ENCOUNTER — Ambulatory Visit (HOSPITAL_COMMUNITY): Payer: Medicare Other

## 2018-06-16 ENCOUNTER — Encounter: Payer: Self-pay | Admitting: Internal Medicine

## 2018-06-23 ENCOUNTER — Other Ambulatory Visit: Payer: Self-pay | Admitting: Cardiology

## 2018-06-24 ENCOUNTER — Other Ambulatory Visit: Payer: Self-pay | Admitting: Internal Medicine

## 2018-06-25 ENCOUNTER — Ambulatory Visit: Payer: Medicare Other | Admitting: Internal Medicine

## 2018-06-25 ENCOUNTER — Other Ambulatory Visit: Payer: Self-pay | Admitting: Cardiology

## 2018-06-30 ENCOUNTER — Telehealth: Payer: Self-pay | Admitting: *Deleted

## 2018-06-30 NOTE — Telephone Encounter (Signed)
Received Comprehensive Eye Exam Report from [facility]; forwarded to provider/SLS 01/21

## 2018-07-21 ENCOUNTER — Ambulatory Visit: Payer: Medicare Other | Admitting: Internal Medicine

## 2018-07-21 ENCOUNTER — Ambulatory Visit (HOSPITAL_BASED_OUTPATIENT_CLINIC_OR_DEPARTMENT_OTHER)
Admission: RE | Admit: 2018-07-21 | Discharge: 2018-07-21 | Disposition: A | Discharge status: Home / Self Care | Payer: Medicare Other | Source: Ambulatory Visit | Attending: Internal Medicine | Admitting: Internal Medicine

## 2018-07-21 ENCOUNTER — Encounter: Payer: Self-pay | Admitting: Internal Medicine

## 2018-07-21 VITALS — BP 128/84 | HR 68 | Temp 98.4°F | Resp 16 | Ht 68.0 in | Wt 188.4 lb

## 2018-07-21 DIAGNOSIS — M2550 Pain in unspecified joint: Secondary | ICD-10-CM

## 2018-07-21 DIAGNOSIS — E785 Hyperlipidemia, unspecified: Secondary | ICD-10-CM

## 2018-07-21 NOTE — Patient Instructions (Addendum)
GO TO THE LAB : Get the blood work     GO TO THE FRONT DESK Schedule your next appointment   checkup in 4 weeks    STOP BY THE FIRST FLOOR:  get the XR    Stop Crestor for now  Call if severe symptoms, headache, rash, high fever.

## 2018-07-21 NOTE — Progress Notes (Signed)
Pre visit review using our clinic review tool, if applicable. No additional management support is needed unless otherwise documented below in the visit note. 

## 2018-07-21 NOTE — Progress Notes (Signed)
Subjective:    Patient ID: Eric [name], male    DOB: July 27, 1945, 73 y.o.   MRN: [id number]  DOS:  07/21/2018 Type of visit - description: Acute visit He was doing relatively well until 3 weeks ago when he started to develop pain. The pain started the right wrist and hand.  Then started to hurt at the right shoulder & right-sided chest. Subsequently the left wrist, left shoulder. He also c/o  left calf pain without swelling and right groin pain. The pain is worse in the morning, he admits to some stiffness that lasts at least 2 hours in the morning. In the afternoons the pain is less noticeable but not gone. He gets stiff again after resting for just 15 minutes. Not taking any new medication in fact we recently decreased his Crestor dose.   Review of Systems  Denies fevers. No chest pain, shortness of breath or palpitations. No myalgias No nausea or vomiting No weight loss, rash, visual disturbances or headaches. No cough No dysuria or gross hematuria Past Medical History:  Diagnosis Date  . Abnormal LFTs   . Anxiety   . Aortic stenosis 04/15/2011  . Dizziness 04/21/2014  . Dyspnea   . Heart murmur   . HNP (herniated nucleus pulposus), lumbar    L5-S1  . Hyperglycemia 10/20/2014  . Hyperlipidemia   . Hypertension   . Hypothyroidism   . Left bundle branch block (LBBB) 12/18/2017  . Lumbar stenosis   . Macular edema    Dr  Sherlynn Stalls   . 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 AORTIC VALVE REPLACEMENT N/A 12/17/2017   Procedure: MINIMALLY INVASIVE AORTIC VALVE REPLACEMENT (AVR) [ using Edwards Intuity Valve Size 51mm;  Surgeon: Rexene Alberts, MD;  Location: [place];  Service: Open Heart Surgery;  Laterality: N/A;  MINI THORACOTOMY  . CARDIAC  CATHETERIZATION  11/10/00  . CATARACT EXTRACTION W/ INTRAOCULAR LENS IMPLANT     RIGHT EYE  . CHOLECYSTECTOMY    . EYE SURGERY     Right eye cataract  . INGUINAL HERNIA REPAIR     (B)  . LUMBAR LAMINECTOMY/DECOMPRESSION MICRODISCECTOMY N/A 04/23/2018   Procedure: Microlumbar decompression Lumbar five-Sacral one;  Surgeon: Susa Day, MD;  Location: [place];  Service: Orthopedics;  Laterality: N/A;  . POLYPECTOMY    . RIGHT/LEFT HEART CATH AND CORONARY ANGIOGRAPHY N/A 10/16/2017   Procedure: RIGHT/LEFT HEART CATH AND CORONARY ANGIOGRAPHY;  Surgeon: [name], MD;  Location: [place] [facility];  Service: Cardiovascular;  Laterality: N/A;  . TEE WITHOUT CARDIOVERSION N/A 12/17/2017   Procedure: TRANSESOPHAGEAL ECHOCARDIOGRAM (TEE);  Surgeon: Rexene Alberts, MD;  Location: [place];  Service: Open Heart Surgery;  Laterality: N/A;  . TONSILLECTOMY      Social History   Socioeconomic History  . Marital status: Married    Spouse name: Not on file  . Number of children: 3  . Years of education: Not on file  . Highest education level: Not on file  Occupational History  . Occupation: retired- used to work for [employer]: 05/2008  Social Needs  . Financial resource strain: Not on file  . Food insecurity:    Worry: Not on file    Inability: Not on file  . Transportation needs:    Medical: Not  on file    Non-medical: Not on file  Tobacco Use  . Smoking status: Former Smoker    Last attempt to quit: 04/15/1990    Years since quitting: 28.2  . Smokeless tobacco: Never Used  . Tobacco comment: used to smoke 2 ppd  Substance and Sexual Activity  . Alcohol use: Not Currently    Comment: no ETOH since the 90s  . Drug use: No  . Sexual activity: Not on file  Lifestyle  . Physical activity:    Days per week: Not on file    Minutes per session: Not on file  . Stress: Not on file  Relationships  . Social connections:    Talks on phone: Not on file    Gets together: Not on  file    Attends religious service: Not on file    Active member of club or organization: Not on file    Attends meetings of clubs or organizations: Not on file    Relationship status: Not on file  . Intimate partner violence:    Fear of current or ex partner: Not on file    Emotionally abused: Not on file    Physically abused: Not on file    Forced sexual activity: Not on file  Other Topics Concern  . Not on file  Social History Narrative   Lives w/ wife   3 children, 8 g-kids                Allergies as of 07/21/2018      Reactions   Atorvastatin Other (See Comments)   REACTION: myalgia   Ezetimibe Other (See Comments)   REACTION: myalgia      Medication List       Accurate as of July 21, 2018  2:50 PM. Always use your most recent med list.        amLODipine 5 MG tablet Commonly known as:  NORVASC TAKE 1 TABLET BY MOUTH EVERY DAY   aspirin EC 81 MG tablet Take 1 tablet (81 mg total) by mouth daily. Resume 5 days postop   carvedilol 12.5 MG tablet Commonly known as:  COREG TAKE 1 TABLET (12.5 MG TOTAL) BY MOUTH 2 (TWO) TIMES DAILY WITH A MEAL.   ibuprofen 200 MG tablet Commonly known as:  ADVIL,MOTRIN Take 2 tablets (400 mg total) by mouth every 8 (eight) hours as needed for headache or moderate pain. May resume 5 days post-op as needed   levothyroxine 88 MCG tablet Commonly known as:  SYNTHROID, LEVOTHROID Take 1 tablet (88 mcg total) by mouth daily before breakfast.   oxyCODONE 5 MG immediate release tablet Commonly known as:  Oxy IR/ROXICODONE Take 1-2 tablets (5-10 mg total) by mouth every 4 (four) hours as needed for severe pain.   rosuvastatin 5 MG tablet Commonly known as:  CRESTOR Take 1 tablet (5 mg total) by mouth daily.   telmisartan 80 MG tablet Commonly known as:  MICARDIS Take 0.5 tablets (40 mg total) by mouth daily.           Objective:   Physical Exam BP 128/84 (BP Location: Left Arm, Patient Position: Sitting, Cuff Size:  Normal)   Pulse 68   Temp 98.4 F (36.9 C) (Oral)   Resp 16   Ht 5\' 8"  (1.727 m)   Wt 188 lb 6 oz (85.4 kg)   SpO2 96%   BMI 28.64 kg/m  General:   Well developed, NAD, BMI noted.  HEENT:  Normocephalic . Face symmetric,  atraumatic Lungs:  CTA B Normal respiratory effort, no intercostal retractions, no accessory muscle use. Heart: RRR,  no murmur.  no pretibial edema bilaterally.  Calves: soft and symmetric. Abdomen:  Not distended, soft, non-tender. No rebound or rigidity.  No inguinal hernia Skin: Not pale. Not jaundice MSK: Right hand and wrist: Slightly puffy, wrist is painful with passive and active flexion extension.  Not actually TTP.  The dorsum of the hand is also slightly puffy. Right hand and wrist: Normal Knees: No evidence of synovitis. Shoulders: Slightly TTP anteriorly but otherwise normal. Neurologic:  alert & oriented X3.  Speech normal, gait appropriate for age and unassisted Psych--  Cognition and judgment appear intact.  Cooperative with normal attention span and concentration.  Behavior appropriate. No anxious or depressed appearing.     Assessment      Assessment HTN Hyperlipidemia Hypothyroidism Increase LFTs Ultrasound 2002 done for increased LFTs: Normal liver; Hep C negative 02-16-15 Aortic stenosis , dx  2012 , s/p AoVR 12/2017 Macular degeneration, cataracts  +FH CAD  --Pt's Brother has CAD dx age 75s --Patient himself has a cardiac catheterization with minimal to no disease 10/2017  PLAN Acute onset of symmetric polyarthritis: The patient has developed polyarthritis as described above, he denies headache, visual disturbances or a rash. DDX is large, will do blood work and x-rays today.  Further advised with results.  Most likely will need to see rheumatology.  Also will recommend to stop Crestor for now. CMP, FLP, TSH, HIV, RPR, vitamin D, sed rate, ANA, RF, total CK. Chest x-ray, hand x-ray. High cholesterol: Stop Crestor for  now RTC ~ 08/2018

## 2018-07-22 LAB — CBC WITH DIFFERENTIAL/PLATELET
BASOS PCT: 0.7 % (ref 0.0–3.0)
Basophils Absolute: 0 10*3/uL (ref 0.0–0.1)
EOS PCT: 0.7 % (ref 0.0–5.0)
Eosinophils Absolute: 0 10*3/uL (ref 0.0–0.7)
HCT: 41.3 % (ref 39.0–52.0)
Hemoglobin: 13.9 g/dL (ref 13.0–17.0)
LYMPHS ABS: 1.1 10*3/uL (ref 0.7–4.0)
Lymphocytes Relative: 17.4 % (ref 12.0–46.0)
MCHC: 33.8 g/dL (ref 30.0–36.0)
MCV: 89 fl (ref 78.0–100.0)
MONO ABS: 0.9 10*3/uL (ref 0.1–1.0)
MONOS PCT: 13.5 % — AB (ref 3.0–12.0)
NEUTROS PCT: 67.7 % (ref 43.0–77.0)
Neutro Abs: 4.5 10*3/uL (ref 1.4–7.7)
Platelets: 323 10*3/uL (ref 150.0–400.0)
RBC: 4.63 Mil/uL (ref 4.22–5.81)
RDW: 13.2 % (ref 11.5–15.5)
WBC: 6.6 10*3/uL (ref 4.0–10.5)

## 2018-07-22 LAB — COMPREHENSIVE METABOLIC PANEL
ALT: 15 U/L (ref 0–53)
AST: 20 U/L (ref 0–37)
Albumin: 4.1 g/dL (ref 3.5–5.2)
Alkaline Phosphatase: 91 U/L (ref 39–117)
BUN: 15 mg/dL (ref 6–23)
CHLORIDE: 102 meq/L (ref 96–112)
CO2: 22 meq/L (ref 19–32)
Calcium: 9.4 mg/dL (ref 8.4–10.5)
Creatinine, Ser: 1.14 mg/dL (ref 0.40–1.50)
GFR: 63.05 mL/min (ref >60.00)
GLUCOSE: 88 mg/dL (ref 70–99)
Potassium: 4 mEq/L (ref 3.5–5.1)
Sodium: 137 mEq/L (ref 135–145)
Total Bilirubin: 1 mg/dL (ref 0.2–1.2)
Total Protein: 7.3 g/dL (ref 6.0–8.3)

## 2018-07-22 LAB — RPR: RPR Ser Ql: NONREACTIVE

## 2018-07-22 LAB — VITAMIN D 25 HYDROXY (VIT D DEFICIENCY, FRACTURES): VITD: 13.74 ng/mL — ABNORMAL LOW (ref 30.00–100.00)

## 2018-07-22 LAB — CK: CK TOTAL: 72 U/L (ref 7–232)

## 2018-07-22 LAB — HIV ANTIBODY (ROUTINE TESTING W REFLEX): HIV 1&2 Ab, 4th Generation: NONREACTIVE

## 2018-07-22 LAB — SEDIMENTATION RATE: SED RATE: 72 mm/h — AB (ref 0–20)

## 2018-07-22 LAB — RHEUMATOID FACTOR

## 2018-07-22 LAB — TSH: TSH: 8.16 u[IU]/mL — AB (ref 0.35–4.50)

## 2018-07-22 LAB — ANA: Anti Nuclear Antibody(ANA): NEGATIVE

## 2018-07-22 NOTE — Assessment & Plan Note (Signed)
Acute onset of symmetric polyarthritis: The patient has developed polyarthritis as described above, he denies headache, visual disturbances or a rash. DDX is large, will do blood work and x-rays today.  Further advised with results.  Most likely will need to see rheumatology.  Also will recommend to stop Crestor for now. CMP, FLP, TSH, HIV, RPR, vitamin D, sed rate, ANA, RF, total CK. Chest x-ray, hand x-ray. High cholesterol: Stop Crestor for now RTC ~ 08/2018

## 2018-07-23 ENCOUNTER — Other Ambulatory Visit (INDEPENDENT_AMBULATORY_CARE_PROVIDER_SITE_OTHER): Payer: Medicare Other

## 2018-07-23 DIAGNOSIS — E039 Hypothyroidism, unspecified: Secondary | ICD-10-CM

## 2018-07-23 LAB — T3, FREE: T3, Free: 2.7 pg/mL (ref 2.3–4.2)

## 2018-07-23 LAB — T4, FREE: Free T4: 1.04 ng/dL (ref 0.60–1.60)

## 2018-07-23 MED ORDER — VITAMIN D (ERGOCALCIFEROL) 1.25 MG (50000 UNIT) PO CAPS: 50000.0000 [IU] | ORAL_CAPSULE | ORAL | 0 refills | Status: DC

## 2018-07-23 NOTE — Addendum Note (Signed)
Addended byDamita Dunnings D on: 07/23/2018 08:57 AM   Modules accepted: Orders

## 2018-07-24 ENCOUNTER — Other Ambulatory Visit: Payer: Self-pay | Admitting: Cardiology

## 2018-08-11 ENCOUNTER — Encounter: Payer: Self-pay | Admitting: Internal Medicine

## 2018-08-11 ENCOUNTER — Ambulatory Visit: Payer: Medicare Other | Admitting: Internal Medicine

## 2018-08-11 VITALS — BP 126/68 | HR 71 | Temp 98.0°F | Resp 16 | Ht 68.0 in | Wt 188.2 lb

## 2018-08-11 DIAGNOSIS — M2550 Pain in unspecified joint: Secondary | ICD-10-CM

## 2018-08-11 LAB — CBC WITH DIFFERENTIAL/PLATELET
BASOS ABS: 0.1 10*3/uL (ref 0.0–0.1)
Basophils Relative: 0.8 % (ref 0.0–3.0)
EOS ABS: 0.1 10*3/uL (ref 0.0–0.7)
Eosinophils Relative: 0.8 % (ref 0.0–5.0)
HCT: 39.7 % (ref 39.0–52.0)
Hemoglobin: 13 g/dL (ref 13.0–17.0)
LYMPHS ABS: 1 10*3/uL (ref 0.7–4.0)
Lymphocytes Relative: 15.2 % (ref 12.0–46.0)
MCHC: 32.7 g/dL (ref 30.0–36.0)
MCV: 89.5 fl (ref 78.0–100.0)
MONO ABS: 0.7 10*3/uL (ref 0.1–1.0)
MONOS PCT: 11 % (ref 3.0–12.0)
Neutro Abs: 4.9 10*3/uL (ref 1.4–7.7)
Neutrophils Relative %: 72.2 % (ref 43.0–77.0)
PLATELETS: 298 10*3/uL (ref 150.0–400.0)
RBC: 4.44 Mil/uL (ref 4.22–5.81)
RDW: 14.1 % (ref 11.5–15.5)
WBC: 6.8 10*3/uL (ref 4.0–10.5)

## 2018-08-11 LAB — BASIC METABOLIC PANEL
BUN: 14 mg/dL (ref 6–23)
CALCIUM: 9.2 mg/dL (ref 8.4–10.5)
CO2: 25 mEq/L (ref 19–32)
CREATININE: 0.99 mg/dL (ref 0.40–1.50)
Chloride: 103 mEq/L (ref 96–112)
GFR: 74.19 mL/min (ref >60.00)
Glucose, Bld: 100 mg/dL — ABNORMAL HIGH (ref 70–99)
Potassium: 4 mEq/L (ref 3.5–5.1)
SODIUM: 138 meq/L (ref 135–145)

## 2018-08-11 LAB — HIGH SENSITIVITY CRP: CRP HIGH SENSITIVITY: 57.69 mg/L — AB (ref 0.000–5.000)

## 2018-08-11 LAB — SEDIMENTATION RATE: SED RATE: 56 mm/h — AB (ref 0–20)

## 2018-08-11 MED ORDER — PREDNISONE 10 MG PO TABS: ORAL_TABLET | ORAL | 0 refills | Status: DC

## 2018-08-11 NOTE — Assessment & Plan Note (Signed)
Acute onset of symmetric polyarthritis: Labs at the last visit showed slight increased TSH and low vitamin D.  He is taking vitamin D supplements.  He is not better actually pain is more intense. He is also getting more stiff. We will see rheumatology in approximately 3 weeks. We will get a sed rate, CRP, BMP, CBC, hep C, anti-CCP, Lyme serology (for completeness, no h/o tick bites ). PMR and TA are in the differential, consider steroids as soon as labs are back .Marland Kitchen Addendum: ESR not worse, CRP elevated, will try prednisone taper, see prescription.  Patient to watch for side effects and notify me if that help his symptoms.

## 2018-08-11 NOTE — Patient Instructions (Addendum)
Please schedule Medicare Wellness with Glenard Haring.   GO TO THE LAB : Get the blood work     Next visit in 3 months

## 2018-08-11 NOTE — Progress Notes (Signed)
Pre visit review using our clinic review tool, if applicable. No additional management support is needed unless otherwise documented below in the visit note. 

## 2018-08-11 NOTE — Progress Notes (Signed)
Subjective:    Patient ID: Eric [name], male    DOB: 1946/01/01, 73 y.o.   MRN: [id number]  DOS:  08/11/2018 Type of visit - description: Follow-up, here with his wife Since the last visit, pain is worse and he is getting much more stiff.  Stiffness lasts at least an hour if not more every morning. Pain is mostly located around the shoulders, hands and wrists. Hip pain is less prominent. Some knee pain and back pain. Taking vitamin D supplements is not helping.  Review of Systems No fever, chills? No visual disturbances No headaches.  Past Medical History:  Diagnosis Date  . Abnormal LFTs   . Anxiety   . Aortic stenosis 04/15/2011  . Dizziness 04/21/2014  . Dyspnea   . Heart murmur   . HNP (herniated nucleus pulposus), lumbar    L5-S1  . Hyperglycemia 10/20/2014  . Hyperlipidemia   . Hypertension   . Hypothyroidism   . Left bundle branch block (LBBB) 12/18/2017  . Lumbar stenosis   . Macular edema    Dr  Sherlynn Stalls   . 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 AORTIC VALVE REPLACEMENT N/A 12/17/2017   Procedure: MINIMALLY INVASIVE AORTIC VALVE REPLACEMENT (AVR) [ using Edwards Intuity Valve Size 47m;  Surgeon: ORexene Alberts MD;  [facility] [facility]  Service: Open Heart Surgery;  Laterality: N/A;  MINI THORACOTOMY  . CARDIAC CATHETERIZATION  11/10/00  . CATARACT EXTRACTION W/ INTRAOCULAR LENS IMPLANT     RIGHT EYE  . CHOLECYSTECTOMY    . EYE SURGERY     Right eye cataract  . INGUINAL HERNIA REPAIR     (B)  . LUMBAR LAMINECTOMY/DECOMPRESSION MICRODISCECTOMY N/A 04/23/2018   Procedure: Microlumbar decompression Lumbar five-Sacral one;  Surgeon: BSusa Day MD;  [place] [facility]  Service: Orthopedics;  Laterality: N/A;  . POLYPECTOMY    . RIGHT/LEFT  HEART CATH AND CORONARY ANGIOGRAPHY N/A 10/16/2017   Procedure: RIGHT/LEFT HEART CATH AND CORONARY ANGIOGRAPHY;  Surgeon: CSherren Mocha MD;  [facility];  Service: Cardiovascular;  Laterality: N/A;  . TEE WITHOUT CARDIOVERSION N/A 12/17/2017   Procedure: TRANSESOPHAGEAL ECHOCARDIOGRAM (TEE);  Surgeon: ORexene Alberts MD;  [facility]  Service: Open Heart Surgery;  Laterality: N/A;  . TONSILLECTOMY      Social History   Socioeconomic History  . Marital status: Married    Spouse name: Not on file  . Number of children: 3  . Years of education: Not on file  . Highest education level: Not on file  Occupational History  . Occupation: retired- used to work for [employer] 05/2008  Social Needs  . Financial resource strain: Not on file  . Food insecurity:    Worry: Not on file    Inability: Not on file  . Transportation needs:    Medical: Not on file    Non-medical: Not on file  Tobacco Use  . Smoking status: Former Smoker    Last attempt to quit: 04/15/1990    Years since quitting: 28.3  . Smokeless tobacco: Never Used  . Tobacco comment: used to smoke 2 ppd  Substance and Sexual Activity  . Alcohol use: Not Currently    Comment: no ETOH since the 90s  . Drug use: No  . Sexual  activity: Not on file  Lifestyle  . Physical activity:    Days per week: Not on file    Minutes per session: Not on file  . Stress: Not on file  Relationships  . Social connections:    Talks on phone: Not on file    Gets together: Not on file    Attends religious service: Not on file    Active member of club or organization: Not on file    Attends meetings of clubs or organizations: Not on file    Relationship status: Not on file  . Intimate partner violence:    Fear of current or ex partner: Not on file    Emotionally abused: Not on file    Physically abused: Not on file    Forced sexual activity: Not on file  Other Topics Concern  . Not on file  Social History  Narrative   Lives w/ wife   3 children, 8 g-kids                Allergies as of 08/11/2018      Reactions   Atorvastatin Other (See Comments)   REACTION: myalgia   Ezetimibe Other (See Comments)   REACTION: myalgia      Medication List       Accurate as of August 11, 2018 10:14 AM. Always use your most recent med list.        acetaminophen 500 MG tablet Commonly known as:  TYLENOL Take 1,000 mg by mouth every 8 (eight) hours as needed for moderate pain.   amLODipine 5 MG tablet Commonly known as:  NORVASC TAKE 1 TABLET BY MOUTH EVERY DAY   aspirin EC 81 MG tablet Take 1 tablet (81 mg total) by mouth daily. Resume 5 days postop   carvedilol 12.5 MG tablet Commonly known as:  COREG TAKE 1 TABLET (12.5 MG TOTAL) BY MOUTH 2 (TWO) TIMES DAILY WITH A MEAL.   ibuprofen 200 MG tablet Commonly known as:  ADVIL,MOTRIN Take 2 tablets (400 mg total) by mouth every 8 (eight) hours as needed for headache or moderate pain. May resume 5 days post-op as needed   levothyroxine 88 MCG tablet Commonly known as:  SYNTHROID, LEVOTHROID Take 1 tablet (88 mcg total) by mouth daily before breakfast.   oxyCODONE 5 MG immediate release tablet Commonly known as:  Oxy IR/ROXICODONE Take 1-2 tablets (5-10 mg total) by mouth every 4 (four) hours as needed for severe pain.   telmisartan 80 MG tablet Commonly known as:  MICARDIS TAKE 0.5 TABLETS (40 MG TOTAL) BY MOUTH DAILY.   Vitamin D (Ergocalciferol) 1.25 MG (50000 UT) Caps capsule Commonly known as:  DRISDOL Take 1 capsule (50,000 Units total) by mouth every 7 (seven) days.           Objective:   Physical Exam BP 126/68 (BP Location: Left Arm, Patient Position: Sitting, Cuff Size: Small)   Pulse 71   Temp 98 F (36.7 C) (Oral)   Resp 16   Ht _0  (1.727 m)   Wt 188 lb 4 oz (85.4 kg)   SpO2 98%   BMI 28.62 kg/m  General:   Well developed, NAD, BMI noted.Marland Kitchen  He definitely seem uncomfortable when transferring from the chair  to the table due to stiffness. HEENT:  Normocephalic . Face symmetric, atraumatic EOMI Temporary artery: Normal to palpation, not tender Lungs:  CTA B Normal respiratory effort, no intercostal retractions, no accessory muscle use. Heart: RRR,  no murmur.  No pretibial edema bilaterally  MSK: Hands with no synovitis Skin: Not pale. Not jaundice Neurologic:  alert & oriented X3.  Speech normal, gait appropriate for age and unassisted Psych--  Cognition and judgment appear intact.  Cooperative with normal attention span and concentration.  Behavior appropriate. No anxious or depressed appearing.      Assessment    Assessment HTN Hyperlipidemia Hypothyroidism Increase LFTs Ultrasound 2002 done for increased LFTs: Normal liver; Hep C negative 02-16-15 Aortic stenosis , dx  2012 , s/p AoVR 12/2017 Macular degeneration, cataracts  +FH CAD  --Pt's Brother has CAD dx age 105s --Patient himself has a cardiac catheterization with minimal to no disease 10/2017  PLAN Acute onset of symmetric polyarthritis: Labs at the last visit showed slight increased TSH and low vitamin D.  He is taking vitamin D supplements.  He is not better actually pain is more intense. He is also getting more stiff. We will see rheumatology in approximately 3 weeks. We will get a sed rate, CRP, BMP, CBC, hep C, anti-CCP, Lyme serology (for completeness, no h/o tick bites ). PMR and TA are in the differential, consider steroids as soon as labs are back .Marland Kitchen Addendum: ESR not worse, CRP elevated, will try prednisone taper, see prescription.  Patient to watch for side effects and notify me if that help his symptoms.

## 2018-08-12 NOTE — Progress Notes (Signed)
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  History of Present Illness: Eric [name] is a 73 y.o. male seen in consultation per request of his PCP.  According to patient he underwent lumbar spine discectomy in November 2019.  6 weeks later he started experiencing pain and discomfort in joints and muscles.  He states he was experiencing increasing stiffness after prolonged sitting.  He developed swelling in his right hand and right wrist joint in February 2020.  Gradually the swelling spread to his left hand.  He has been also having muscle pain in his shoulders.  He describes pain in bilateral hip joints, left knee joint and left foot.  He still continues to have swelling in his hands.  On March 3 he was given prednisone taper by Dr. Larose Kells.  He has been off prednisone for the last 2 days.  He noticed a lot of improvement in his symptoms on prednisone.  Activities of Daily Living:  Patient reports morning stiffness for several hours.   Patient Reports nocturnal pain.  Difficulty dressing/grooming: Denies Difficulty climbing stairs: Denies Difficulty getting out of chair: Reports Difficulty using hands for taps, buttons, cutlery, and/or writing: Reports  Review of Systems  Constitutional: Positive for fatigue.  HENT: Negative for mouth sores, mouth dryness and nose dryness.   Eyes: Positive for dryness. Negative for pain and itching.  Respiratory: Negative for shortness of breath, wheezing and difficulty breathing.   Cardiovascular: Negative for chest pain, hypertension and swelling in legs/feet.  Gastrointestinal: Positive for nausea. Negative for abdominal pain, blood in stool, constipation and diarrhea.  Endocrine: Negative for increased urination.   Genitourinary: Negative for painful urination.  Musculoskeletal: Positive for arthralgias, joint pain, joint swelling, muscle weakness, morning stiffness and muscle tenderness.  Skin: Negative for rash and redness.  Allergic/Immunologic: Negative for susceptible to infections.  Neurological: Positive for parasthesias. Negative for dizziness, light-headedness, numbness, headaches, memory loss and weakness.  Hematological: Negative for bruising/bleeding tendency.  Psychiatric/Behavioral: Negative for confusion. The patient is not nervous/anxious.     PMFS History:  Patient Active Problem List   Diagnosis Date Noted  . Spinal stenosis of lumbar region 04/23/2018  . HNP (herniated nucleus pulposus), lumbar 04/23/2018  . Prolapsed lumbar disc 03/03/2018  . Wide-complex tachycardia ([place]) 12/30/2017  . Left bundle branch block (LBBB) 12/18/2017  . 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 Abnormal LFTs   . Macular edema   . PCP NOTES >>>>>> 04/27/2015  . Hyperglycemia 10/20/2014  . Dizziness 04/21/2014  . Annual physical exam 04/15/2011  . Aortic stenosis 04/15/2011  . Hypothyroidism 10/13/2007  . Hyperlipidemia 10/13/2007  . Hypertension 04/08/2007    Past Medical History:  Diagnosis Date  . Abnormal LFTs   . Anxiety   . Aortic stenosis 04/15/2011  . Dizziness 04/21/2014  . Dyspnea   . Heart murmur   . HNP (herniated nucleus pulposus), lumbar    L5-S1  . Hyperglycemia 10/20/2014  . Hyperlipidemia   . Hypertension   . Hypothyroidism   . Left bundle branch block (LBBB) 12/18/2017  . Lumbar stenosis   .  Macular edema    Dr  Sherlynn Stalls   . 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 Diabetes Mother   . Stroke Mother   .  Diabetes Brother        ?  . Coronary artery disease Brother        Male 1st degree relative >50, had CABG in his 2s  . Emphysema Father   . COPD Father   . Diabetes Daughter   . Colon cancer Neg Hx   . Prostate cancer Neg Hx   . Colon polyps Neg Hx   . Esophageal cancer Neg Hx   . Rectal cancer Neg Hx   . Stomach cancer Neg Hx    Past Surgical History:  Procedure Laterality Date  . AORTIC VALVE REPLACEMENT N/A 12/17/2017   Procedure: MINIMALLY INVASIVE AORTIC VALVE REPLACEMENT (AVR) [ using Edwards Intuity Valve Size 46mm;  Surgeon: Rexene Alberts, MD;  Location: [facility];  Service: Open Heart Surgery;  Laterality: N/A;  MINI THORACOTOMY  . CARDIAC CATHETERIZATION  11/10/00  . CATARACT EXTRACTION W/ INTRAOCULAR LENS IMPLANT     RIGHT EYE  . CHOLECYSTECTOMY    . EYE SURGERY     Right eye cataract  . INGUINAL HERNIA REPAIR     (B)  . LUMBAR LAMINECTOMY/DECOMPRESSION MICRODISCECTOMY N/A 04/23/2018   Procedure: Microlumbar decompression Lumbar five-Sacral one;  Surgeon: Susa Day, MD;  Location: [facility];  Service: Orthopedics;  Laterality: N/A;  . POLYPECTOMY    . RIGHT/LEFT HEART CATH AND CORONARY ANGIOGRAPHY N/A 10/16/2017   Procedure: RIGHT/LEFT HEART CATH AND CORONARY ANGIOGRAPHY;  Surgeon: [name], MD;  Location: [facility];  Service: Cardiovascular;  Laterality: N/A;  . TEE WITHOUT CARDIOVERSION N/A 12/17/2017   Procedure: TRANSESOPHAGEAL ECHOCARDIOGRAM (TEE);  Surgeon: Rexene Alberts, MD;  Location: [facility];  Service: Open Heart Surgery;  Laterality: N/A;  . TONSILLECTOMY     Social History   Social History Narrative   Lives w/ wife   3 children, 8 g-kids             Immunization History  Administered Date(s) Administered  . Influenza Split 03/11/2013, 02/17/2014  . Influenza Whole 04/13/2007, 03/15/2008, 03/10/2012  . Influenza, High Dose Seasonal PF 04/04/2015, 03/28/2016, 03/18/2017, 03/16/2018  . Pneumococcal Conjugate-13 04/21/2014  .  Pneumococcal Polysaccharide-23 04/15/2012  . Td 06/10/1996, 06/10/2000  . Tdap 04/15/2011  . Zoster 03/29/2015     Objective: Vital Signs: BP 134/78 (BP Location: Right Arm, Patient Position: Sitting, Cuff Size: Normal)   Pulse 76   Resp 14   Ht 5\' 7"  (1.702 m)   Wt 185 lb (83.9 kg)   BMI 28.98 kg/m    Physical Exam Vitals signs and nursing note reviewed.  Constitutional:      Appearance: He is well-developed.  HENT:     Head: Normocephalic and atraumatic.  Eyes:     Conjunctiva/sclera: Conjunctivae normal.     Pupils: Pupils are equal, round, and reactive to light.  Neck:     Musculoskeletal: Normal range of motion and neck supple.  Cardiovascular:     Rate and Rhythm: Normal rate and regular rhythm.     Heart sounds: Normal heart sounds.  Pulmonary:     Effort: Pulmonary effort is normal.     Breath sounds: Normal  breath sounds.  Abdominal:     General: Bowel sounds are normal.     Palpations: Abdomen is soft.  Skin:    General: Skin is warm and dry.     Capillary Refill: Capillary refill takes less than 2 seconds.  Neurological:     Mental Status: He is alert and oriented to person, place, and time.  Psychiatric:        Behavior: Behavior normal.      Musculoskeletal Exam: C-spine good range of motion.  He has limited range of motion of lumbar spine with discomfort.  Shoulder joints elbow joints wrist joints MCPs PIPs DIPs been good range of motion.  He has some thickening of PIP and DIP joints.  No synovitis was noted.  He has some discomfort range of motion of hip joints.  He has some warmth swelling and mild effusion in his left knee joint.  Right knee joint was in good range of motion.  He has tenderness across MTP joints but no synovitis was noted.  CDAI Exam: CDAI Score: Not documented Patient Global Assessment: Not documented; Provider Global Assessment: Not documented Swollen: Not documented; Tender: Not documented Joint Exam   Not documented   There  is currently no information documented on the homunculus. Go to the Rheumatology activity and complete the homunculus joint exam.  Investigation: Findings:  07/21/18: TSH 8.16, HIV negative, RPR negative, sed rate 72, CK 72, ANA negative, RF-, vitamin D 13 08/11/18: CRP 57, sed rate 56, B. Burgdorferi Ab negative, T4 1.04, Hep C negative   Component     Latest Ref Rng & Units 07/21/2018  TSH     0.35 - 4.50 uIU/mL 8.16 (H)  HIV     NON-REACTI NON-REACTIVE  RPR     NON-REACTI NON-REACTIVE  Sed Rate     0 - 20 mm/hr 72 (H)  CK Total     7 - 232 U/L 72  Anti Nuclear Antibody(ANA)     NEGATIVE NEGATIVE  RA Latex Turbid.     <14 IU/mL <14  VITD     30.00 - 100.00 ng/mL 13.74 (L)   Component     Latest Ref Rng & Units 07/23/2018 08/11/2018  Hepatitis C Ab     NON-REACTI  NON-REACTIVE  SIGNAL TO CUT-OFF     <1.00  0.07  Triiodothyronine,Free,Serum     2.3 - 4.2 pg/mL 2.7   T4,Free(Direct)     0.60 - 1.60 ng/dL 1.04   Sed Rate     0 - 20 mm/hr  56 (H)  C-Reactive Protein, Cardiac     0.000 - 5.000 mg/L  57.690 (H)  B burgdorferi Ab IgG+IgM     index  <0.90   Imaging: Xr Foot 2 Views Left  Result Date: 08/24/2018 First MTP, PIP and DIP narrowing was noted.  No erosive changes were noted.  Intertarsal joint space narrowing was noted.  Dorsal spur was noted.  Inferior calcaneal spur was noted. Impression: These findings are consistent with osteoarthritis of the foot.  Xr Foot 2 Views Right  Result Date: 08/24/2018 First MTP, PIP and DIP narrowing was noted.  No erosive changes were noted.  Intertarsal joint space narrowing was noted.  Dorsal spur was noted.  Inferior calcaneal spur was noted. Impression: These findings are consistent with osteoarthritis of the foot.  Xr Knee 3 View Left  Result Date: 08/24/2018 Moderate medial compartment narrowing was noted.  Mild patellofemoral narrowing was noted.  No chondrocalcinosis was noted. 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   Recent Labs: Lab Results  Component Value Date   WBC 6.8 08/11/2018   HGB 13.0 08/11/2018   PLT 298.0 08/11/2018   NA 138 08/11/2018   K 4.0 08/11/2018   CL 103 08/11/2018   CO2 25 08/11/2018   GLUCOSE 100 (H) 08/11/2018   BUN 14 08/11/2018   CREATININE 0.99 08/11/2018   BILITOT 1.0 07/21/2018   ALKPHOS 91 07/21/2018   AST 20 07/21/2018   ALT 15 07/21/2018   PROT 7.3 07/21/2018   ALBUMIN 4.1 07/21/2018   CALCIUM 9.2 08/11/2018   GFRAA >60 04/24/2018    Speciality Comments: No specialty comments available.  Procedures:  No procedures performed Allergies: Atorvastatin and Ezetimibe   Assessment / Plan:     Visit Diagnoses: Polyarthralgia - 07/21/18: TSH 8.16, HIV negative, RPR negative, sed rate 72, CK 72, ANA negative, RF-, vitamin D 133/3/20: CRP 57, sed rate 56, B. Burgdorferi Ab negative  Myalgia-patient complains he was experiencing muscle pain in his upper and lower extremities during the episode.  He had no muscular weakness on examination.  Polymyalgia rheumatica has to be in the differential as well.  I will keep him on 10 mg of prednisone for right now until his labs are available.  We also decided to add methotrexate as a presumptive diagnosis of rheumatoid arthritis once the lab results are available.  If we can taper prednisone after starting methotrexate most likely to be due to rheumatoid arthritis.  Pain in both hands -patient reports having severe swelling in his bilateral hands since February.  He states he was recently given a prednisone taper by Dr. Larose Kells and the symptoms resolved.  He has some stiffness coming back in his joints.  I do not see any synovitis on examination.  X-ray of the bilateral hands were consistent with osteoarthritis which were obtained recently.  I will obtain additional labs today.  Based on the history the differential diagnosis would be rheumatoid arthritis or  polymyalgia rheumatica with elevated sedimentation rate.  Plan: Cyclic citrul peptide antibody, IgG, 14-3-3 eta Protein, ANA, Uric acid  Chronic pain of left knee -he had warmth swelling and mild effusion in his left knee joint.  The x-ray of the knee joint showed moderate osteoarthritis and mild chondromalacia patella.  Plan: XR KNEE 3 VIEW LEFT  Pain in both feet -he complains of discomfort in his bilateral feet without any swelling.  The findings were consistent with osteoarthritis and x-rays were consistent with osteoarthritis as well.  Plan: XR Foot 2 Views Right, XR Foot 2 Views Left  High risk medication use - Plan: Serum protein electrophoresis with reflex, QuantiFERON-TB Gold Plus, IgG, IgA, IgM, Hepatitis B core antibody, IgM, Hepatitis B surface antigen   Spinal stenosis of lumbar region, unspecified whether neurogenic claudication present - S/p discectomy by Dr. Maxie Better April 23, 2018  Essential hypertension-his blood pressure is under control.  Left bundle branch block (LBBB)  Wide-complex tachycardia ([facility])  History of hypothyroidism  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  History of anxiety  Family history of psoriasis - daughter    Orders: Orders Placed This Encounter  Procedures  . XR KNEE 3 VIEW LEFT  . XR Foot 2 Views Right  . XR Foot 2 Views Left  . Serum protein electrophoresis with reflex  . QuantiFERON-TB Gold Plus  . IgG, IgA, IgM  . Cyclic citrul peptide antibody, IgG  .  14-3-3 eta Protein  . ANA  . Uric acid  . Hepatitis B core antibody, IgM  . Hepatitis B surface antigen  . CBC with Differential/Platelet  . COMPLETE METABOLIC PANEL WITH GFR   Meds ordered this encounter  Medications  . predniSONE (DELTASONE) 10 MG tablet    Sig: Take 1 tablet (10 mg total) by mouth daily with breakfast.    Dispense:  30 tablet    Refill:  0    Face-to-face time spent with patient was 60 minutes. Greater than 50% of time  was spent in counseling and coordination of care.  Follow-Up Instructions: Return for Inflammatory arthritis.   [name], MD  Note - This record has been created using Editor, commissioning.  Chart creation errors have been sought, but may not always  have been located. Such creation errors do not reflect on  the standard of medical care.

## 2018-08-13 ENCOUNTER — Encounter: Payer: Self-pay | Admitting: Internal Medicine

## 2018-08-13 LAB — B. BURGDORFI ANTIBODIES: B burgdorferi Ab IgG+IgM: <0.9 index

## 2018-08-13 LAB — HEPATITIS C ANTIBODY
Hepatitis C Ab: NONREACTIVE
SIGNAL TO CUT-OFF: 0.07 (ref <1.00)

## 2018-08-13 LAB — CYCLIC CITRUL PEPTIDE ANTIBODY, IGG

## 2018-08-18 ENCOUNTER — Ambulatory Visit: Payer: Medicare Other | Admitting: Internal Medicine

## 2018-08-21 ENCOUNTER — Other Ambulatory Visit: Payer: Self-pay | Admitting: Cardiology

## 2018-08-24 ENCOUNTER — Ambulatory Visit (INDEPENDENT_AMBULATORY_CARE_PROVIDER_SITE_OTHER): Payer: Medicare Other

## 2018-08-24 ENCOUNTER — Ambulatory Visit (INDEPENDENT_AMBULATORY_CARE_PROVIDER_SITE_OTHER): Payer: Self-pay

## 2018-08-24 ENCOUNTER — Ambulatory Visit: Payer: Medicare Other | Admitting: Rheumatology

## 2018-08-24 ENCOUNTER — Encounter: Payer: Self-pay | Admitting: Rheumatology

## 2018-08-24 ENCOUNTER — Other Ambulatory Visit: Payer: Self-pay

## 2018-08-24 VITALS — BP 134/78 | HR 76 | Resp 14 | Ht 67.0 in | Wt 185.0 lb

## 2018-08-24 DIAGNOSIS — M79671 Pain in right foot: Secondary | ICD-10-CM

## 2018-08-24 DIAGNOSIS — I447 Left bundle-branch block, unspecified: Secondary | ICD-10-CM

## 2018-08-24 DIAGNOSIS — M79672 Pain in left foot: Secondary | ICD-10-CM

## 2018-08-24 DIAGNOSIS — M2550 Pain in unspecified joint: Secondary | ICD-10-CM

## 2018-08-24 DIAGNOSIS — M79641 Pain in right hand: Secondary | ICD-10-CM

## 2018-08-24 DIAGNOSIS — I472 Ventricular tachycardia: Secondary | ICD-10-CM

## 2018-08-24 DIAGNOSIS — G8929 Other chronic pain: Secondary | ICD-10-CM

## 2018-08-24 DIAGNOSIS — M25562 Pain in left knee: Secondary | ICD-10-CM

## 2018-08-24 DIAGNOSIS — Z8659 Personal history of other mental and behavioral disorders: Secondary | ICD-10-CM

## 2018-08-24 DIAGNOSIS — I10 Essential (primary) hypertension: Secondary | ICD-10-CM

## 2018-08-24 DIAGNOSIS — Z79899 Other long term (current) drug therapy: Secondary | ICD-10-CM

## 2018-08-24 DIAGNOSIS — M48061 Spinal stenosis, lumbar region without neurogenic claudication: Secondary | ICD-10-CM

## 2018-08-24 DIAGNOSIS — Z8639 Personal history of other endocrine, nutritional and metabolic disease: Secondary | ICD-10-CM

## 2018-08-24 DIAGNOSIS — M79642 Pain in left hand: Secondary | ICD-10-CM

## 2018-08-24 DIAGNOSIS — R000 Tachycardia, unspecified: Secondary | ICD-10-CM

## 2018-08-24 DIAGNOSIS — Z840 Family history of diseases of the skin and subcutaneous tissue: Secondary | ICD-10-CM

## 2018-08-24 DIAGNOSIS — Z953 Presence of xenogenic heart valve: Secondary | ICD-10-CM

## 2018-08-24 MED ORDER — PREDNISONE 10 MG PO TABS: 10.0000 mg | ORAL_TABLET | Freq: Every day | ORAL | 0 refills | Status: DC

## 2018-08-24 NOTE — Progress Notes (Signed)
Pharmacy Note  Subjective: Patient presents today to the [facility] to see Dr. Estanislado Pandy.  Patient seen by the pharmacist for counseling on methotrexate.  Objective: CBC    Component Value Date/Time   WBC 6.8 08/11/2018 1037   RBC 4.44 08/11/2018 1037   HGB 13.0 08/11/2018 1037   HGB 13.3 02/24/2018 1150   HCT 39.7 08/11/2018 1037   HCT 41.8 02/24/2018 1150   PLT 298.0 08/11/2018 1037   PLT 233 02/24/2018 1150   MCV 89.5 08/11/2018 1037   MCV 88 02/24/2018 1150   MCH 27.6 04/14/2018 1258   MCHC 32.7 08/11/2018 1037   RDW 14.1 08/11/2018 1037   RDW 15.2 02/24/2018 1150   LYMPHSABS 1.0 08/11/2018 1037   MONOABS 0.7 08/11/2018 1037   EOSABS 0.1 08/11/2018 1037   BASOSABS 0.1 08/11/2018 1037    CMP     Component Value Date/Time   NA 138 08/11/2018 1037   NA 140 02/24/2018 1150   K 4.0 08/11/2018 1037   CL 103 08/11/2018 1037   CO2 25 08/11/2018 1037   GLUCOSE 100 (H) 08/11/2018 1037   BUN 14 08/11/2018 1037   BUN 15 02/24/2018 1150   CREATININE 0.99 08/11/2018 1037   CALCIUM 9.2 08/11/2018 1037   PROT 7.3 07/21/2018 1524   PROT 7.1 02/24/2018 1150   ALBUMIN 4.1 07/21/2018 1524   ALBUMIN 4.6 02/24/2018 1150   AST 20 07/21/2018 1524   ALT 15 07/21/2018 1524   ALKPHOS 91 07/21/2018 1524   BILITOT 1.0 07/21/2018 1524   BILITOT 0.6 02/24/2018 1150   GFRNONAA >60 04/24/2018 0639   GFRAA >60 04/24/2018 0639   Baseline Immunosuppressant Therapy Labs  TB gold: pending 08/24/18  Hepatitis Latest Ref Rng & Units 08/11/2018  Hep C Ab NEGATIVE -  Hep C Ab NON-REACTI NON-REACTIVE  Hep C Ab NON-REACTI NON-REACTIVE   Hep B: pending 08/24/2018  Lab Results  Component Value Date   HIV NON-REACTIVE 07/21/2018   Immunoglobulins: pending 08/24/2018  SPEP: pending 08/24/2018  Chest-xray:  No acute abnormality 07/22/2018  Alcohol use: N/A  Assessment/Plan:   Patient was counseled on the purpose, proper use, and adverse effects of methotrexate including  nausea, infection, and signs and symptoms of pneumonitis. Discussed that there is the possibility of an increased risk of malignancy, specifically lymphomas, but it is not well understood if this increased risk is due to the medication or the disease state.  Instructed patient that medication should be held for infection and prior to surgery.  Advised patient to avoid live vaccines. Recommend annual influenza, Pneumovax 23, Prevnar 13, and Shingrix as indicated.   Reviewed instructions with patient to take methotrexate weekly along with folic acid daily.  Discussed the importance of frequent monitoring of kidney and liver function and blood counts, and provided patient with standing lab instructions.  Counseled patient to avoid NSAIDs and alcohol while on methotrexate.  Provided patient with educational materials on methotrexate and answered all questions.   Patient voiced understanding.  Patient consented to methotrexate use.  Will upload into chart.    Dose of methotrexate will be 15 mg weekly for 2 weeks then increase to 20 mg weekly as tolerated along with folic acid 2mg  daily. Prescription pending lab results.  All questions encouraged and answered.  Instructed patient to call with any further questions or concerns.  Mariella Saa, PharmD, [facility] Rheumatology Clinical Pharmacist  08/24/2018 12:07 PM

## 2018-08-24 NOTE — Patient Instructions (Signed)
Start taking prednisone 10 mg daily.  Standing Labs We placed an order today for your standing lab work.    Please come back and get your standing labs after starting methotrexate in 2 weeks, 4 weeks, 8 weeks, then every 3 months.  We have open lab Monday through Friday from 8:30-11:30 AM and 1:30-4:00 PM  at the office of Dr. [name].   You may experience shorter wait times on Monday and Friday afternoons. The office is located at [address], [place], [place], [place] [address] No appointment is necessary.   Labs are drawn by [facility].  You may receive a bill from [facility] for your lab work.  If you wish to have your labs drawn at another location, please call the office 24 hours in advance to send orders.  If you have any questions regarding directions or hours of operation,  please call [phone].   Just as a reminder please drink plenty of water prior to coming for your lab work. Thanks!  Vaccines You are taking a medication(s) that can suppress your immune system.  The following immunizations are recommended: . Flu annually . Pneumonia (Pneumovax 23 and Prevnar 13 spaced at least 1 year apart) . Shingrix  Please check with your PCP to make sure you are up to date.   Methotrexate tablets What is this medicine? METHOTREXATE (METH oh TREX ate) is a chemotherapy drug used to treat cancer including breast cancer, leukemia, and lymphoma. This medicine can also be used to treat psoriasis and certain kinds of arthritis. This medicine may be used for other purposes; ask your health care provider or pharmacist if you have questions. COMMON BRAND NAME(S): Rheumatrex, Trexall What should I tell my health care provider before I take this medicine? They need to know if you have any of these conditions: -fluid in the stomach area or lungs -if you often drink alcohol -infection or immune system problems -kidney disease or on hemodialysis -liver disease -low blood counts,  like low white cell, platelet, or red cell counts -lung disease -radiation therapy -stomach ulcers -ulcerative colitis -an unusual or allergic reaction to methotrexate, other medicines, foods, dyes, or preservatives -pregnant or trying to get pregnant -breast-feeding How should I use this medicine? Take this medicine by mouth with a glass of water. Follow the directions on the prescription label. Take your medicine at regular intervals. Do not take it more often than directed. Do not stop taking except on your doctor's advice. Make sure you know why you are taking this medicine and how often you should take it. If this medicine is used for a condition that is not cancer, like arthritis or psoriasis, it should be taken weekly, NOT daily. Taking this medicine more often than directed can cause serious side effects, even death. Talk to your healthcare provider about safe handling and disposal of this medicine. You may need to take special precautions. Talk to your pediatrician regarding the use of this medicine in children. While this drug may be prescribed for selected conditions, precautions do apply. Overdosage: If you think you have taken too much of this medicine contact a poison control center or emergency room at once. NOTE: This medicine is only for you. Do not share this medicine with others. What if I miss a dose? If you miss a dose, talk with your doctor or health care professional. Do not take double or extra doses. What may interact with this medicine? This medicine may interact with the following medication: -acitretin -aspirin  and aspirin-like medicines including salicylates -azathioprine -certain antibiotics like penicillins, tetracycline, and chloramphenicol -cyclosporine -gold -hydroxychloroquine -live virus vaccines -NSAIDs, medicines for pain and inflammation, like ibuprofen or naproxen -other cytotoxic  agents -penicillamine -phenylbutazone -phenytoin -probenecid -retinoids such as isotretinoin and tretinoin -steroid medicines like prednisone or cortisone -sulfonamides like sulfasalazine and trimethoprim/sulfamethoxazole -theophylline This list may not describe all possible interactions. Give your health care provider a list of all the medicines, herbs, non-prescription drugs, or dietary supplements you use. Also tell them if you smoke, drink alcohol, or use illegal drugs. Some items may interact with your medicine. What should I watch for while using this medicine? Avoid alcoholic drinks. This medicine can make you more sensitive to the sun. Keep out of the sun. If you cannot avoid being in the sun, wear protective clothing and use sunscreen. Do not use sun lamps or tanning beds/booths. You may need blood work done while you are taking this medicine. Call your doctor or health care professional for advice if you get a fever, chills or sore throat, or other symptoms of a cold or flu. Do not treat yourself. This drug decreases your body's ability to fight infections. Try to avoid being around people who are sick. This medicine may increase your risk to bruise or bleed. Call your doctor or health care professional if you notice any unusual bleeding. Check with your doctor or health care professional if you get an attack of severe diarrhea, nausea and vomiting, or if you sweat a lot. The loss of too much body fluid can make it dangerous for you to take this medicine. Talk to your doctor about your risk of cancer. You may be more at risk for certain types of cancers if you take this medicine. Both men and women must use effective birth control with this medicine. Do not become pregnant while taking this medicine or until at least 1 normal menstrual cycle has occurred after stopping it. Women should inform their doctor if they wish to become pregnant or think they might be pregnant. Men should not  father a child while taking this medicine and for 3 months after stopping it. There is a potential for serious side effects to an unborn child. Talk to your health care professional or pharmacist for more information. Do not breast-feed an infant while taking this medicine. What side effects may I notice from receiving this medicine? Side effects that you should report to your doctor or health care professional as soon as possible: -allergic reactions like skin rash, itching or hives, swelling of the face, lips, or tongue -breathing problems or shortness of breath -diarrhea -dry, nonproductive cough -low blood counts - this medicine may decrease the number of white blood cells, red blood cells and platelets. You may be at increased risk for infections and bleeding. -mouth sores -redness, blistering, peeling or loosening of the skin, including inside the mouth -signs of infection - fever or chills, cough, sore throat, pain or trouble passing urine -signs and symptoms of bleeding such as bloody or black, tarry stools; red or dark-brown urine; spitting up blood or brown material that looks like coffee grounds; red spots on the skin; unusual bruising or bleeding from the eye, gums, or nose -signs and symptoms of kidney injury like trouble passing urine or change in the amount of urine -signs and symptoms of liver injury like dark yellow or brown urine; general ill feeling or flu-like symptoms; light-colored stools; loss of appetite; nausea; right upper belly pain; unusually weak or  tired; yellowing of the eyes or skin Side effects that usually do not require medical attention (report to your doctor or health care professional if they continue or are bothersome): -dizziness -hair loss -tiredness -upset stomach -vomiting This list may not describe all possible side effects. Call your doctor for medical advice about side effects. You may report side effects to FDA at 1-800-FDA-1088. Where should I keep  my medicine? Keep out of the reach of children. Store at room temperature between 20 and 25 degrees C (68 and 77 degrees F). Protect from light. Throw away any unused medicine after the expiration date. NOTE: This sheet is a summary. It may not cover all possible information. If you have questions about this medicine, talk to your doctor, pharmacist, or health care provider.  2019 Elsevier/Gold Standard (2017-01-16 13:38:43)

## 2018-08-28 LAB — PROTEIN ELECTROPHORESIS, SERUM, WITH REFLEX
ALPHA 2: 0.7 g/dL (ref 0.5–0.9)
Albumin ELP: 3.8 g/dL (ref 3.8–4.8)
Alpha 1: 0.3 g/dL (ref 0.2–0.3)
BETA 2: 0.3 g/dL (ref 0.2–0.5)
Beta Globulin: 0.4 g/dL (ref 0.4–0.6)
GAMMA GLOBULIN: 1.1 g/dL (ref 0.8–1.7)
TOTAL PROTEIN: 6.7 g/dL (ref 6.1–8.1)

## 2018-08-28 LAB — HEPATITIS B SURFACE ANTIGEN: Hepatitis B Surface Ag: NONREACTIVE

## 2018-08-28 LAB — QUANTIFERON-TB GOLD PLUS
Mitogen-NIL: 7.3 IU/mL
NIL: 0.03 [IU]/mL
QuantiFERON-TB Gold Plus: NEGATIVE
TB1-NIL: 0 IU/mL
TB2-NIL: 0 IU/mL

## 2018-08-28 LAB — IGG, IGA, IGM
IMMUNOGLOBULIN A: 211 mg/dL (ref 70–320)
IgG (Immunoglobin G), Serum: 1227 mg/dL (ref 600–1540)
IgM, Serum: 93 mg/dL (ref 50–300)

## 2018-08-28 LAB — IFE INTERPRETATION: IMMUNOFIX ELECTR INT: NOT DETECTED

## 2018-08-28 LAB — URIC ACID: Uric Acid, Serum: 7.6 mg/dL (ref 4.0–8.0)

## 2018-08-28 LAB — ANA: Anti Nuclear Antibody (ANA): NEGATIVE

## 2018-08-28 LAB — 14-3-3 ETA PROTEIN

## 2018-08-28 LAB — CYCLIC CITRUL PEPTIDE ANTIBODY, IGG: Cyclic Citrullin Peptide Ab: <16 UNITS

## 2018-08-28 LAB — HEPATITIS B CORE ANTIBODY, IGM: Hep B C IgM: NONREACTIVE

## 2018-08-31 ENCOUNTER — Ambulatory Visit: Payer: Self-pay | Admitting: Rheumatology

## 2018-09-04 ENCOUNTER — Encounter: Payer: Self-pay | Admitting: Rheumatology

## 2018-09-04 MED ORDER — PREDNISONE 5 MG PO TABS: ORAL_TABLET | ORAL | 0 refills | Status: DC

## 2018-09-04 NOTE — Telephone Encounter (Signed)
Please send in prednisone 5 mg tablets so he can increase to 15 mg for 1 week, 12.5 mg for 1 week, then he can resume 10 mg po daily until new patient follow up.

## 2018-09-04 NOTE — Telephone Encounter (Signed)
Please clarify what dose has been most effective in the past for the patient.

## 2018-09-15 ENCOUNTER — Telehealth: Payer: Self-pay | Admitting: Rheumatology

## 2018-09-15 MED ORDER — PREDNISONE 5 MG PO TABS: 5.0000 mg | ORAL_TABLET | Freq: Every day | ORAL | 0 refills | Status: DC

## 2018-09-15 MED ORDER — PREDNISONE 10 MG PO TABS: 10.0000 mg | ORAL_TABLET | Freq: Every day | ORAL | 0 refills | Status: DC

## 2018-09-15 NOTE — Telephone Encounter (Signed)
Okay to give prescription for prednisone 15 mg p.o. daily for 1 month.

## 2018-09-15 NOTE — Telephone Encounter (Signed)
Last Visit: 08/24/18 Next Visit: 09/25/18

## 2018-09-15 NOTE — Progress Notes (Signed)
Virtual Visit via Telephone Note  I connected with Eric [name] on 09/15/18 at  9:45 AM EDT by telephone and verified that I am speaking with the correct person using two identifiers.   I discussed the limitations, risks, security and privacy concerns of performing an evaluation and management service by telephone and the availability of in person appointments. I also discussed with the patient that there may be a patient responsible charge related to this service. The patient expressed understanding and agreed to proceed.  CC: Muscle tenderness   History of Present Illness: Patient is a 73 year old male with a past medical history of osteoarthritis. He is currently on prednisone 15 mg po daily.  He has been almost entirely asymptomatic while on prednisone.  He has intermittent muscle soreness and tenderness in both shoulders.  He has no muscle weakness.  He has no difficulty raising his arms above his head or getting up from a chair.  He has no joint pain or joint swelling at this time.  He has no muscle weakness.     Review of Systems  Constitutional: Positive for malaise/fatigue. Negative for fever.  Eyes: Negative for photophobia, pain, discharge and redness.  Respiratory: Negative for cough, shortness of breath and wheezing.   Cardiovascular: Negative for chest pain and palpitations.  Gastrointestinal: Negative for blood in stool, constipation and diarrhea.  Genitourinary: Negative for dysuria.  Musculoskeletal: Positive for myalgias. Negative for back pain, joint pain and neck pain.  Skin: Negative for rash.  Neurological: Negative for dizziness and headaches.  Psychiatric/Behavioral: Negative for depression. The patient is not nervous/anxious and does not have insomnia.    Observations/Objective: Physical Exam  Constitutional: He is oriented to person, place, and time.  Neurological: He is alert and oriented to person, place, and time.  Psychiatric: Mood, memory, affect and  judgment normal.    Patient reports morning stiffness for0  minutes.   Patient denies nocturnal pain.  Difficulty dressing/grooming: Denies Difficulty climbing stairs: Denies Difficulty getting out of chair: Denies Difficulty using hands for taps, buttons, cutlery, and/or writing: Denies   Findings:  07/21/18: TSH 8.16, HIV negative, RPR negative, sed rate 72, CK 72, ANA negative, RF-, vitamin D 13 08/11/18: CRP 57, sed rate 56, B. 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  He states while he was on 10 mg of prednisone his symptoms recurred in the past.  We had detailed discussion regarding possibility of seronegative rheumatoid arthritis versus polymyalgia rheumatica.  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  We had discussed methotrexate during the last visit.  At the time the consent was taken.  I reviewed the side effects of methotrexate again today.  Indications side effects contraindications were reviewed.  We will start him on methotrexate 4 tablets p.o. weekly along with folic acid 2 mg p.o. daily and increase the dose of methotrexate by 2 tablets every 2 weeks until he reaches 8 tablets/week if the labs are stable.  I also plan to taper his prednisone from 15 to 10 mg after couple of weeks.  If he does well we can try tapering prednisone further.  07/21/18: TSH 8.16, HIV negative,  RPR negative, sed rate 72, CK 72, ANA negative, RF-, vitamin D 133/3/20: CRP 57, sed rate 56, B. Burgdorferi Ab negative:   Myalgia:He was previously experiencing myalgias in upper and lower extremities. He does not have any muscle  weakness.  He continues to have some muscle tenderness of both shoulders.  He has no difficulty raising his arms above his head.  He is currently on prednisone 15 mg po daily which has improved his symptoms.   High risk medication use - Indications, contraindications, and potential side effects of MTX were discussed. He will return for lab work in 2 weeks x2, then 2 months, then every 3 months.   Drug Counseling Patient was counseled on the purpose, proper use, and adverse effects of methotrexate including nausea, infection, and signs and symptoms of pneumonitis.  Reviewed instructions with patient to take methotrexate weekly along with folic acid daily.  Discussed the importance of frequent monitoring of kidney and liver function and blood counts, and provided patient with standing lab instructions.  Counseled patient to avoid NSAIDs and alcohol while on methotrexate.  Provided patient with educational materials on methotrexate and answered all questions.  Advised patient to get annual influenza vaccine and to get a pneumococcal vaccine if patient has not already had one.  Patient voiced understanding.  Patient consented to methotrexate use.  Will upload into chart.  Patient stated that he already had all the immunization.  Primary osteoarthritis of both hands: Severe swelling in his bilateral hands since February-give a prednisone taper by Dr. Larose Kells and the symptoms resolved.  He has some stiffness coming back in his joints.  X-ray of the bilateral hands were consistent with osteoarthritis which were obtained recently.  He is currently on prednisone 15 mg po daily. He has no joint swelling at this time. He will be started on MTX and folic acid.   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   Primary osteoarthritis of both feet: He has no feet pain or joint swelling at this time.  Spinal stenosis of lumbar  region, unspecified whether neurogenic claudication present - S/p discectomy by Dr. Maxie Better April 23, 2018  Follow Up Instructions: He will follow up in 3 months.    I discussed the assessment and treatment plan with the patient. The patient was provided an opportunity to ask questions and all were answered. The patient agreed with the plan and demonstrated an understanding of the instructions.   The patient was advised to call back or seek an in-person evaluation if the symptoms worsen or if the condition fails to improve as anticipated.  I provided 30  minutes of non-face-to-face time during this encounter.  [name], MD   Scribed by-  Ofilia Neas, PA-C

## 2018-09-15 NOTE — Telephone Encounter (Signed)
Patient called requesting prescription refill of Prednisone to be sent to [facility] [address] in [place].  Patient states since decreasing his medication to 12 mg he is having more pain with daily activities.  Patient states he felt better when he was taking 15 mg.

## 2018-09-18 ENCOUNTER — Other Ambulatory Visit: Payer: Self-pay | Admitting: Cardiology

## 2018-09-24 ENCOUNTER — Ambulatory Visit: Payer: Self-pay | Admitting: Rheumatology

## 2018-09-25 ENCOUNTER — Telehealth (INDEPENDENT_AMBULATORY_CARE_PROVIDER_SITE_OTHER): Payer: Medicare Other | Admitting: Rheumatology

## 2018-09-25 ENCOUNTER — Telehealth: Payer: Self-pay | Admitting: *Deleted

## 2018-09-25 ENCOUNTER — Encounter: Payer: Self-pay | Admitting: Rheumatology

## 2018-09-25 DIAGNOSIS — M2550 Pain in unspecified joint: Secondary | ICD-10-CM | POA: Diagnosis not present

## 2018-09-25 DIAGNOSIS — I10 Essential (primary) hypertension: Secondary | ICD-10-CM

## 2018-09-25 DIAGNOSIS — M48061 Spinal stenosis, lumbar region without neurogenic claudication: Secondary | ICD-10-CM

## 2018-09-25 DIAGNOSIS — M170 Bilateral primary osteoarthritis of knee: Secondary | ICD-10-CM

## 2018-09-25 DIAGNOSIS — Z8659 Personal history of other mental and behavioral disorders: Secondary | ICD-10-CM

## 2018-09-25 DIAGNOSIS — M19071 Primary osteoarthritis, right ankle and foot: Secondary | ICD-10-CM

## 2018-09-25 DIAGNOSIS — Z953 Presence of xenogenic heart valve: Secondary | ICD-10-CM

## 2018-09-25 DIAGNOSIS — M19041 Primary osteoarthritis, right hand: Secondary | ICD-10-CM

## 2018-09-25 DIAGNOSIS — M791 Myalgia, unspecified site: Secondary | ICD-10-CM

## 2018-09-25 DIAGNOSIS — M19042 Primary osteoarthritis, left hand: Secondary | ICD-10-CM

## 2018-09-25 DIAGNOSIS — Z79899 Other long term (current) drug therapy: Secondary | ICD-10-CM

## 2018-09-25 DIAGNOSIS — I447 Left bundle-branch block, unspecified: Secondary | ICD-10-CM

## 2018-09-25 DIAGNOSIS — Z8639 Personal history of other endocrine, nutritional and metabolic disease: Secondary | ICD-10-CM

## 2018-09-25 DIAGNOSIS — M19072 Primary osteoarthritis, left ankle and foot: Secondary | ICD-10-CM

## 2018-09-25 DIAGNOSIS — Z840 Family history of diseases of the skin and subcutaneous tissue: Secondary | ICD-10-CM

## 2018-09-25 MED ORDER — FOLIC ACID 1 MG PO TABS: 2.0000 mg | ORAL_TABLET | Freq: Every day | ORAL | 3 refills | Status: DC

## 2018-09-25 MED ORDER — METHOTREXATE 2.5 MG PO TABS: ORAL_TABLET | ORAL | 0 refills | Status: DC

## 2018-09-25 NOTE — Telephone Encounter (Signed)
Spoke with patient and reviewed MTX with patient. Reviewed labs schedule with patient. Patient will go to [facility] every 2 weeks for 3 times then in 2 months. Prescription sent to the pharmacy. Mailed patient information regarding MTX.

## 2018-10-05 ENCOUNTER — Encounter: Payer: Self-pay | Admitting: Cardiology

## 2018-10-05 ENCOUNTER — Other Ambulatory Visit: Payer: Self-pay

## 2018-10-05 ENCOUNTER — Telehealth: Payer: Self-pay | Admitting: Cardiology

## 2018-10-05 ENCOUNTER — Telehealth (INDEPENDENT_AMBULATORY_CARE_PROVIDER_SITE_OTHER): Payer: Medicare Other | Admitting: Cardiology

## 2018-10-05 VITALS — BP 154/94 | HR 66 | Ht 67.0 in | Wt 183.3 lb

## 2018-10-05 DIAGNOSIS — Z953 Presence of xenogenic heart valve: Secondary | ICD-10-CM

## 2018-10-05 DIAGNOSIS — I472 Ventricular tachycardia: Secondary | ICD-10-CM

## 2018-10-05 DIAGNOSIS — R000 Tachycardia, unspecified: Secondary | ICD-10-CM

## 2018-10-05 DIAGNOSIS — I10 Essential (primary) hypertension: Secondary | ICD-10-CM

## 2018-10-05 DIAGNOSIS — Z7189 Other specified counseling: Secondary | ICD-10-CM

## 2018-10-05 DIAGNOSIS — I447 Left bundle-branch block, unspecified: Secondary | ICD-10-CM

## 2018-10-05 MED ORDER — TELMISARTAN 80 MG PO TABS: ORAL_TABLET | ORAL | 1 refills | Status: DC

## 2018-10-05 NOTE — Telephone Encounter (Signed)
Cardiac Questionnaire:    Since your last visit or hospitalization:    1. Have you been having new or worsening chest pain? no   2. Have you been having new or worsening shortness of breath?no 3. Have you been having new or worsening leg swelling, wt gain, or increase in abdominal girth (pants fitting more tightly)? no   4. Have you had any passing out spells? no    *A YES to any of these questions would result in the appointment being kept. *If all the answers to these questions are NO, we should indicate that given the current situation regarding the worldwide coronarvirus pandemic, at the recommendation of the CDC, we are looking to limit gatherings in our waiting area, and thus will reschedule their appointment beyond four weeks from today.   _____________   GNFAO-13 Pre-Screening Questions:   Do you currently have a fever? no  Have you recently travelled on a cruise, internationally, or to [place], [place], [place], or [place], [place]) ? no  Have you been in contact with someone that is currently pending confirmation of Covid19 testing or has been confirmed to have the [place] virus?  no  Are you currently experiencing fatigue or cough? No     Virtual Visit Pre-Appointment Phone Call  "(Name), I am calling you today to discuss your upcoming appointment. We are currently trying to limit exposure to the virus that causes COVID-19 by seeing patients at home rather than in the office."  1. "What is the BEST phone number to call the day of the visit?" - include this in appointment notes  2. Do you have or have access to (through a family member/friend) a smartphone with video capability that we can use for your visit?" a. If yes - list this number in appt notes as cell (if different from BEST phone #) and list the appointment type as a VIDEO visit in appointment notes b. If no - list the appointment type as a PHONE visit in appointment notes  3. Confirm consent - "In  the setting of the current Covid19 crisis, you are scheduled for a (phone or video) visit with your provider on (date) at (time).  Just as we do with many in-office visits, in order for you to participate in this visit, we must obtain consent.  If you'd like, I can send this to your mychart (if signed up) or email for you to review.  Otherwise, I can obtain your verbal consent now.  All virtual visits are billed to your insurance company just like a normal visit would be.  By agreeing to a virtual visit, we'd like you to understand that the technology does not allow for your provider to perform an examination, and thus may limit your provider's ability to fully assess your condition. If your provider identifies any concerns that need to be evaluated in person, we will make arrangements to do so.  Finally, though the technology is pretty good, we cannot assure that it will always work on either your or our end, and in the setting of a video visit, we may have to convert it to a phone-only visit.  In either situation, we cannot ensure that we have a secure connection.  Are you willing to proceed?" STAFF: Did the patient verbally acknowledge consent to telehealth visit? Document YES/NO here: Yes  4. Advise patient to be prepared - "Two hours prior to your appointment, go ahead and check your blood pressure, pulse, oxygen saturation,  and your weight (if you have the equipment to check those) and write them all down. When your visit starts, your provider will ask you for this information. If you have an Apple Watch or Kardia device, please plan to have heart rate information ready on the day of your appointment. Please have a pen and paper handy nearby the day of the visit as well."  5. Give patient instructions for MyChart download to smartphone OR Doximity/Doxy.me as below if video visit (depending on what platform provider is using)  6. Inform patient they will receive a phone call 15 minutes prior to their  appointment time (may be from unknown caller ID) so they should be prepared to answer    TELEPHONE CALL NOTE  Eric [name] has been deemed a candidate for a follow-up tele-health visit to limit community exposure during the Covid-19 pandemic. I spoke with the patient via phone to ensure availability of phone/video source, confirm preferred email & phone number, and discuss instructions and expectations.  I reminded Eric [name] to be prepared with any vital sign and/or heart rhythm information that could potentially be obtained via home monitoring, at the time of his visit. I reminded Eric [name] to expect a phone call prior to his visit.  [name] 10/05/2018 8:41 AM    FULL LENGTH CONSENT FOR TELE-HEALTH VISIT   I hereby voluntarily request, consent and authorize [facility] and its employed or contracted physicians, physician assistants, nurse practitioners or other licensed health care professionals (the Practitioner), to provide me with telemedicine health care services (the Services") as deemed necessary by the treating Practitioner. I acknowledge and consent to receive the Services by the Practitioner via telemedicine. I understand that the telemedicine visit will involve communicating with the Practitioner through live audiovisual communication technology and the disclosure of certain medical information by electronic transmission. I acknowledge that I have been given the opportunity to request an in-person assessment or other available alternative prior to the telemedicine visit and am voluntarily participating in the telemedicine visit.  I understand that I have the right to withhold or withdraw my consent to the use of telemedicine in the course of my care at any time, without affecting my right to future care or treatment, and that the Practitioner or I may terminate the telemedicine visit at any time. I understand that I have the right to inspect all information obtained  and/or recorded in the course of the telemedicine visit and may receive copies of available information for a reasonable fee.  I understand that some of the potential risks of receiving the Services via telemedicine include:   Delay or interruption in medical evaluation due to technological equipment failure or disruption;  Information transmitted may not be sufficient (e.g. poor resolution of images) to allow for appropriate medical decision making by the Practitioner; and/or   In rare instances, security protocols could fail, causing a breach of personal health information.  Furthermore, I acknowledge that it is my responsibility to provide information about my medical history, conditions and care that is complete and accurate to the best of my ability. I acknowledge that Practitioner's advice, recommendations, and/or decision may be based on factors not within their control, such as incomplete or inaccurate data provided by me or distortions of diagnostic images or specimens that may result from electronic transmissions. I understand that the practice of medicine is not an exact science and that Practitioner makes no warranties or guarantees regarding treatment outcomes. I acknowledge that I will receive  a copy of this consent concurrently upon execution via email to the email address I last provided but may also request a printed copy by calling the office of [place].    I understand that my insurance will be billed for this visit.   I have read or had this consent read to me.  I understand the contents of this consent, which adequately explains the benefits and risks of the Services being provided via telemedicine.   I have been provided ample opportunity to ask questions regarding this consent and the Services and have had my questions answered to my satisfaction.  I give my informed consent for the services to be provided through the use of telemedicine in my medical care  By  participating in this telemedicine visit I agree to the above.

## 2018-10-05 NOTE — Patient Instructions (Addendum)
Medication Instructions:  Your physician has recommended you make the following change in your medication:   INCREASE telmisartan 80 mg: Take 0.5 tablet (40 mg) daily in the morning. Take an extra 0.5 tablet (40 mg) in the afternoon on Monday and Thursday only.   **Continue to monitor your BP twice daily at the same time each day. If your systolic BP (top number) is consistently greater than 150, contact our office.   If you need a refill on your cardiac medications before your next appointment, please call your pharmacy.   Lab work: None  If you have labs (blood work) drawn today and your tests are completely normal, you will receive your results only by: Marland Kitchen MyChart Message (if you have MyChart) OR . A paper copy in the mail If you have any lab test that is abnormal or we need to change your treatment, we will call you to review the results.  Testing/Procedures: None  Follow-Up: At [facility], you and your health needs are our priority.  As part of our continuing mission to provide you with exceptional heart care, we have created designated Provider Care Teams.  These Care Teams include your primary Cardiologist (physician) and Advanced Practice Providers (APPs -  Physician Assistants and Nurse Practitioners) who all work together to provide you with the care you need, when you need it. You will need a follow up appointment in 6 months: Monday, 04/05/2019, at 9:00 am in the [facility] office.

## 2018-10-05 NOTE — Progress Notes (Signed)
Virtual Visit via Video Note   This visit type was conducted due to national recommendations for restrictions regarding the COVID-19 Pandemic (e.g. social distancing) in an effort to limit this patient's exposure and mitigate transmission in our community.  Due to his co-morbid illnesses, this patient is at least at moderate risk for complications without adequate follow up.  This format is felt to be most appropriate for this patient at this time.  All issues noted in this document were discussed and addressed.  A limited physical exam was performed with this format.  Please refer to the patient's chart for his consent to telehealth for [facility].   Evaluation Performed:  Follow-up visit  Date:  10/05/2018   ID:  Eric [name], DOB 07/28/1945, MRN [id number]  Patient Location: Home Provider Location: Home  PCP:  Colon Branch, MD  Cardiologist:  Shirlee More, MD  Electrophysiologist:  None   Chief Complaint:  FU after AVR tachycardia and hypertension  History of Present Illness:    Eric [name] is a 73 y.o. male with a hx of aortic stenosis and AVR last seen 01/13/18. He underwent minimally invasive aortic valve replacement with bioprosthesis 04/07/2018 in preoperative evaluation prior to lumbar decompression .  He had a readmission to the hospital 12/30/2017 discharge 01/01/2018 with wide-complex tachycardia with cardioversion prior to hospitalization and hypotension secondary to medications.  Echocardiogram during that admission showed ejection fraction of 50 to 55% and normal function of the pericardial tissue valve.   Unfortunately he has been increasingly bothered by his rheumatoid arthritis required steroids but is finally feeling better and is in the process of weaning.  Associated with this his blood pressures been elevated and typically now is greater than 536 and diastolics often run 64-40.  He is sodium restricted his weight is unchanged he has had no chest pain shortness  of breath palpitation or syncope.  After his open heart surgery he had severe symptomatic orthostatic hypotension will slowly increase the dose of his ARB and if blood pressure does not respond add a low-dose diuretic.  The patient does not have symptoms concerning for COVID-19 infection (fever, chills, cough, or new shortness of breath).    Past Medical History:  Diagnosis Date  . Abnormal LFTs   . Anxiety   . Aortic stenosis 04/15/2011  . Dizziness 04/21/2014  . Dyspnea   . Heart murmur   . HNP (herniated nucleus pulposus), lumbar    L5-S1  . Hyperglycemia 10/20/2014  . Hyperlipidemia   . Hypertension   . Hypothyroidism   . Left bundle branch block (LBBB) 12/18/2017  . Lumbar stenosis   . Macular edema    Dr  Sherlynn Stalls   . 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 AORTIC VALVE REPLACEMENT N/A 12/17/2017   Procedure: MINIMALLY INVASIVE AORTIC VALVE REPLACEMENT (AVR) [ using Edwards Intuity Valve Size 63mm;  Surgeon: Rexene Alberts, MD;  Location: [place];  Service: Open Heart Surgery;  Laterality: N/A;  MINI THORACOTOMY  . CARDIAC CATHETERIZATION  11/10/00  . CATARACT EXTRACTION W/ INTRAOCULAR LENS IMPLANT     RIGHT EYE  . CHOLECYSTECTOMY    . EYE SURGERY     Right eye cataract  . INGUINAL HERNIA REPAIR     (B)  . LUMBAR LAMINECTOMY/DECOMPRESSION MICRODISCECTOMY N/A 04/23/2018   Procedure:  Microlumbar decompression Lumbar five-Sacral one;  Surgeon: Susa Day, MD;  Location: [place];  Service: Orthopedics;  Laterality: N/A;  . POLYPECTOMY    . RIGHT/LEFT HEART CATH AND CORONARY ANGIOGRAPHY N/A 10/16/2017   Procedure: RIGHT/LEFT HEART CATH AND CORONARY ANGIOGRAPHY;  Surgeon: [name], MD;  Location: [place] [facility];  Service: Cardiovascular;  Laterality:  N/A;  . TEE WITHOUT CARDIOVERSION N/A 12/17/2017   Procedure: TRANSESOPHAGEAL ECHOCARDIOGRAM (TEE);  Surgeon: Rexene Alberts, MD;  Location: [place];  Service: Open Heart Surgery;  Laterality: N/A;  . TONSILLECTOMY       Current Meds  Medication Sig  . amLODipine (NORVASC) 5 MG tablet TAKE 1 TABLET BY MOUTH EVERY DAY  . aspirin EC 81 MG tablet Take 1 tablet (81 mg total) by mouth daily. Resume 5 days postop  . carvedilol (COREG) 12.5 MG tablet TAKE 1 TABLET (12.5 MG TOTAL) BY MOUTH 2 (TWO) TIMES DAILY WITH A MEAL.  . folic acid (FOLVITE) 1 MG tablet Take 2 tablets (2 mg total) by mouth daily.  Marland Kitchen levothyroxine (SYNTHROID, LEVOTHROID) 88 MCG tablet Take 1 tablet (88 mcg total) by mouth daily before breakfast.  . methotrexate (RHEUMATREX) 2.5 MG tablet Take 4 tabs po x 2 weeks, 6 tabs po 2 weeks then 8 tabs po weekly. Caution:Chemotherapy. Protect from light.  . predniSONE (DELTASONE) 10 MG tablet Take 1 tablet (10 mg total) by mouth daily with breakfast.  . predniSONE (DELTASONE) 5 MG tablet Take 1 tablet (5 mg total) by mouth daily with breakfast. Take 1 tablet with 10 mg tablet.  Marland Kitchen telmisartan (MICARDIS) 80 MG tablet TAKE 0.5 TABLETS (40 MG TOTAL) BY MOUTH DAILY.  Marland Kitchen Vitamin D, Ergocalciferol, (DRISDOL) 1.25 MG (50000 UT) CAPS capsule Take 1 capsule (50,000 Units total) by mouth every 7 (seven) days.     Allergies:   Atorvastatin and Ezetimibe   Social History   Tobacco Use  . Smoking status: Former Smoker    Last attempt to quit: 04/15/1990    Years since quitting: 28.4  . Smokeless tobacco: Never Used  . Tobacco comment: used to smoke 2 ppd  Substance Use Topics  . Alcohol use: Not Currently    Comment: no ETOH since the 90s  . Drug use: No     Family Hx: The patient's family history includes COPD in his father; Coronary artery disease in his brother; Diabetes in his brother, daughter, and mother; Emphysema in his father; Stroke in his mother. There is no history of Colon cancer,  Prostate cancer, Colon polyps, Esophageal cancer, Rectal cancer, or Stomach cancer.  ROS:   Please see the history of present illness.     All other systems reviewed and are negative.   Prior CV studies:   The following studies were reviewed today:    Labs/Other Tests and Data Reviewed:    EKG:  No ECG reviewed.  Recent Labs: 12/30/2017: B Natriuretic Peptide 217.9 12/31/2017: Magnesium 2.3 07/21/2018: ALT 15; TSH 8.16 08/11/2018: BUN 14; Creatinine, Ser 0.99; Hemoglobin 13.0; Platelets 298.0; Potassium 4.0; Sodium 138   Recent Lipid Panel Lab Results  Component Value Date/Time   CHOL 172 02/24/2018 11:50 AM   TRIG 135 02/24/2018 11:50 AM   TRIG 115 05/05/2006 09:13 AM   HDL 61 02/24/2018 11:50 AM   CHOLHDL 2.8 02/24/2018 11:50 AM   CHOLHDL 3 09/03/2017 10:37 AM   LDLCALC 84 02/24/2018 11:50 AM   LDLDIRECT 76.5 04/07/2008 10:57 AM    Wt Readings from Last 3 Encounters:  10/05/18 183 lb 4.8 oz (83.1 kg)  08/24/18 185 lb (83.9 kg)  08/11/18 188 lb 4 oz (85.4 kg)     Objective:    Vital Signs:  Pulse 66   Ht 5\' 7"  (1.702 m)   Wt 183 lb 4.8 oz (83.1 kg)   BMI 28.71 kg/m    VITAL SIGNS:  reviewed GEN:  no acute distress EYES:  sclerae anicteric, EOMI - Extraocular Movements Intact RESPIRATORY:  normal respiratory effort, symmetric expansion CARDIOVASCULAR:  no peripheral edema SKIN:  no rash, lesions or ulcers. MUSCULOSKELETAL:  no obvious deformities. NEURO:  alert and oriented x 3, no obvious focal deficit PSYCH:  normal affect  ASSESSMENT & PLAN:    1. Status post surgical AVR stable he is made a full recovery takes low-dose aspirin has had a postoperative echocardiogram and normal valve function at this time does not require repeat cardiac imaging.  He will continue low-dose aspirin as antithrombotic therapy 2. Essential hypertension presently not controlled increase the dose of ARB and if needed add low-dose diuretic.  He will continue monitor blood pressure  at home recent renal function potassium normal.  Continue treatment including beta-blocker calcium channel blocker and ARB 3. Postoperative wide-complex tachycardia no recurrence he has been seen by electrophysiology he was felt to have had SVT with a Lysle Rubens C and will continue his current beta-blocker. 4. He is at increased cardiovascular risk with immunosuppression with prednisone and methotrexate and I reinforced the need for social distance wearing a mask in public and good handwashing and sanitation technique 5. With left bundle branch block require in office EKG next visit  COVID-19 Education: The signs and symptoms of COVID-19 were discussed with the patient and how to seek care for testing (follow up with PCP or arrange E-visit).  The importance of social distancing was discussed today.  Time:   Today, I have spent 26 minutes with the patient with telehealth technology discussing the above problems.     Medication Adjustments/Labs and Tests Ordered: Current medicines are reviewed at length with the patient today.  Concerns regarding medicines are outlined above.   Tests Ordered: No orders of the defined types were placed in this encounter.   Medication Changes: No orders of the defined types were placed in this encounter.   Disposition:  Follow up in 6 month(s)  Signed, Shirlee More, MD  10/05/2018 9:15 AM    [place]

## 2018-10-06 ENCOUNTER — Other Ambulatory Visit: Payer: Self-pay

## 2018-10-06 DIAGNOSIS — Z79899 Other long term (current) drug therapy: Secondary | ICD-10-CM

## 2018-10-07 LAB — CBC WITH DIFFERENTIAL/PLATELET
Absolute Monocytes: 533 cells/uL (ref 200–950)
Basophils Absolute: 20 cells/uL (ref 0–200)
Basophils Relative: 0.3 %
Eosinophils Absolute: 59 cells/uL (ref 15–500)
Eosinophils Relative: 0.9 %
HCT: 42.1 % (ref 38.5–50.0)
Hemoglobin: 14.1 g/dL (ref 13.2–17.1)
Lymphs Abs: 1846 cells/uL (ref 850–3900)
MCH: 31.3 pg (ref 27.0–33.0)
MCHC: 33.5 g/dL (ref 32.0–36.0)
MCV: 93.3 fL (ref 80.0–100.0)
MPV: 9.8 fL (ref 7.5–12.5)
Monocytes Relative: 8.2 %
Neutro Abs: 4043 cells/uL (ref 1500–7800)
Neutrophils Relative %: 62.2 %
Platelets: 206 10*3/uL (ref 140–400)
RBC: 4.51 10*6/uL (ref 4.20–5.80)
RDW: 15.5 % — ABNORMAL HIGH (ref 11.0–15.0)
Total Lymphocyte: 28.4 %
WBC: 6.5 10*3/uL (ref 3.8–10.8)

## 2018-10-07 LAB — COMPLETE METABOLIC PANEL WITH GFR
AG Ratio: 1.7 (calc) (ref 1.0–2.5)
ALT: 17 U/L (ref 9–46)
AST: 19 U/L (ref 10–35)
Albumin: 4.1 g/dL (ref 3.6–5.1)
Alkaline phosphatase (APISO): 51 U/L (ref 35–144)
BUN: 17 mg/dL (ref 7–25)
CO2: 30 mmol/L (ref 20–32)
Calcium: 9 mg/dL (ref 8.6–10.3)
Chloride: 102 mmol/L (ref 98–110)
Creat: 0.96 mg/dL (ref 0.70–1.18)
GFR, Est African American: 91 mL/min/{1.73_m2} (ref >=60)
GFR, Est Non African American: 79 mL/min/{1.73_m2} (ref >=60)
Globulin: 2.4 g/dL (calc) (ref 1.9–3.7)
Glucose, Bld: 88 mg/dL (ref 65–99)
Potassium: 3.5 mmol/L (ref 3.5–5.3)
Sodium: 140 mmol/L (ref 135–146)
Total Bilirubin: 0.9 mg/dL (ref 0.2–1.2)
Total Protein: 6.5 g/dL (ref 6.1–8.1)

## 2018-10-08 ENCOUNTER — Encounter: Payer: Self-pay | Admitting: *Deleted

## 2018-10-08 ENCOUNTER — Other Ambulatory Visit: Payer: Self-pay | Admitting: Internal Medicine

## 2018-10-08 ENCOUNTER — Telehealth: Payer: Self-pay | Admitting: Cardiology

## 2018-10-08 ENCOUNTER — Encounter: Payer: Self-pay | Admitting: Internal Medicine

## 2018-10-08 ENCOUNTER — Other Ambulatory Visit: Payer: Self-pay | Admitting: Rheumatology

## 2018-10-08 DIAGNOSIS — I10 Essential (primary) hypertension: Secondary | ICD-10-CM

## 2018-10-08 MED ORDER — SPIRONOLACTONE 25 MG PO TABS: 25.0000 mg | ORAL_TABLET | Freq: Every day | ORAL | 3 refills | Status: DC

## 2018-10-08 NOTE — Telephone Encounter (Signed)
Pred 15 mg X39month. If he finished 1 month of 15 mg then he should swtich to 10 mg po q dX 1 mth then taper by 1 mg po q mth.

## 2018-10-08 NOTE — Telephone Encounter (Signed)
Patient informed of Dr. Joya Gaskins recommendations below and is agreeable to plan. Prescription for spironolactone has [facility] [place] as requested and patient will return to our [facility] office for lab work in 2 weeks. No appointment needed, no need to fast beforehand. Patient verbalized understanding. No further questions.

## 2018-10-08 NOTE — Telephone Encounter (Signed)
Add spironolactone 25 mg daily, needs BMP repeat 2 weeks

## 2018-10-08 NOTE — Telephone Encounter (Signed)
Last Visit: 09/25/18 Next visit: due July 2020. Message sent to the front to schedule patient.   Per last office note on 09/25/18 also plan to taper his prednisone from 15 to 10 mg after couple of weeks. When will he start tapering to 10 mg?

## 2018-10-08 NOTE — Telephone Encounter (Signed)
Patient states he was told to report to [facility] if the top number of his BP was over 150 three days consecutive --  Tues 4/28 -- 152/81 Wed 4/29 -- 152/76 Thurs 4/30 -- 166/95  Please call patient to discuss [phone]

## 2018-10-08 NOTE — Telephone Encounter (Signed)
Please advise as patient had a vitrual visit on 10/05/2018 and medication changes were made. Thanks!

## 2018-10-08 NOTE — Telephone Encounter (Signed)
Spoke with patient and he states he started the Prednisone 15 mg on 09/15/18 and is due to finish that on 10/13/18. Patient states he has enough 5 mg tablets. Patient advised to taper to 10 mg of Prednisone daily for 1 month starting on 10/14/18. Patient advised we will taper Prednisone by 1 mg every month after that.

## 2018-10-11 ENCOUNTER — Encounter (HOSPITAL_BASED_OUTPATIENT_CLINIC_OR_DEPARTMENT_OTHER): Payer: Self-pay | Admitting: *Deleted

## 2018-10-11 ENCOUNTER — Emergency Department (HOSPITAL_BASED_OUTPATIENT_CLINIC_OR_DEPARTMENT_OTHER)
Admission: EM | Admit: 2018-10-11 | Discharge: 2018-10-11 | Disposition: A | Discharge status: Home / Self Care | Payer: Medicare Other | Attending: Emergency Medicine | Admitting: Emergency Medicine

## 2018-10-11 ENCOUNTER — Other Ambulatory Visit: Payer: Self-pay

## 2018-10-11 ENCOUNTER — Other Ambulatory Visit: Payer: Self-pay | Admitting: Cardiology

## 2018-10-11 DIAGNOSIS — Z7982 Long term (current) use of aspirin: Secondary | ICD-10-CM | POA: Diagnosis not present

## 2018-10-11 DIAGNOSIS — Z23 Encounter for immunization: Secondary | ICD-10-CM | POA: Insufficient documentation

## 2018-10-11 DIAGNOSIS — I10 Essential (primary) hypertension: Secondary | ICD-10-CM | POA: Diagnosis not present

## 2018-10-11 DIAGNOSIS — W06XXXA Fall from bed, initial encounter: Secondary | ICD-10-CM | POA: Diagnosis not present

## 2018-10-11 DIAGNOSIS — Y92003 Bedroom of unspecified non-institutional (private) residence as the place of occurrence of the external cause: Secondary | ICD-10-CM | POA: Diagnosis not present

## 2018-10-11 DIAGNOSIS — S0181XA Laceration without foreign body of other part of head, initial encounter: Secondary | ICD-10-CM | POA: Insufficient documentation

## 2018-10-11 DIAGNOSIS — E039 Hypothyroidism, unspecified: Secondary | ICD-10-CM | POA: Insufficient documentation

## 2018-10-11 DIAGNOSIS — Y999 Unspecified external cause status: Secondary | ICD-10-CM | POA: Diagnosis not present

## 2018-10-11 DIAGNOSIS — S01111A Laceration without foreign body of right eyelid and periocular area, initial encounter: Secondary | ICD-10-CM

## 2018-10-11 DIAGNOSIS — S0993XA Unspecified injury of face, initial encounter: Secondary | ICD-10-CM | POA: Diagnosis present

## 2018-10-11 DIAGNOSIS — Z87891 Personal history of nicotine dependence: Secondary | ICD-10-CM | POA: Insufficient documentation

## 2018-10-11 DIAGNOSIS — Y939 Activity, unspecified: Secondary | ICD-10-CM | POA: Insufficient documentation

## 2018-10-11 DIAGNOSIS — Z79899 Other long term (current) drug therapy: Secondary | ICD-10-CM | POA: Insufficient documentation

## 2018-10-11 MED ORDER — LIDOCAINE HCL 1 % IJ SOLN
INTRAMUSCULAR | Status: AC
  Administered 2018-10-11: 06:00:00
  Filled 2018-10-11: qty 20

## 2018-10-11 MED ORDER — TETANUS-DIPHTH-ACELL PERTUSSIS 5-2.5-18.5 LF-MCG/0.5 IM SUSP
0.5000 mL | Freq: Once | INTRAMUSCULAR | Status: AC
  Administered 2018-10-11: 07:00:00 0.5 mL via INTRAMUSCULAR
  Filled 2018-10-11: qty 0.5

## 2018-10-11 NOTE — ED Notes (Signed)
Walked pt to car. D/c instructions provided to pt's wife per pt's request.

## 2018-10-11 NOTE — ED Notes (Signed)
MD at bedside to suture.

## 2018-10-11 NOTE — Discharge Instructions (Addendum)
I have put 9 absorbable stitches in your eyebrow and forehead.  There is one area where you may have a bigger scar than the other secondary to missing skin and tissue.  You have a large area of bruising around this that will progressively worsen over the next 24 hours.  It would not surprise me if the bruising spread underneath your eye.  Please leave the bandage that I placed on for approximately 2 to 3 days.  After that replace it with a Band-Aid and keep the wound clean and dry or use antibiotic ointment if you prefer.  The stitches should start to release and fall out on their own in 7 days or so.  If they are not fallen out then you can lightly blot it with a wash rag and they should come out that way.  You do not have any high risk features for skull fracture or brain bleed secondary to the fall however he started having a severe headache, altered mental status, vomiting that does not stop, vision changes or other concerning symptoms please return to the emergency department immediately.  Your blood pressure is high here today.  I noticed that you also medications for an year here with pain making that the likely cause for it but please get it rechecked.

## 2018-10-11 NOTE — ED Notes (Signed)
Pt states he is currently on multiple b/p meds. States MD is trying to get his blood pressure under better control.

## 2018-10-11 NOTE — ED Provider Notes (Signed)
[facility] EMERGENCY DEPARTMENT Provider Note   CSN: [id number] Arrival date & time: 10/11/18  0531    History   Chief Complaint Chief Complaint  Patient presents with  . laceration to forehead    HPI Eric [name] is a 73 y.o. male.     The history is provided by the patient.  Head Laceration  This is a new problem. The current episode started less than 1 hour ago. The problem occurs constantly. The problem has not changed since onset.Pertinent negatives include no chest pain, no headaches and no shortness of breath. Nothing aggravates the symptoms. Nothing relieves the symptoms. He has tried nothing for the symptoms.    Past Medical History:  Diagnosis Date  . Abnormal LFTs   . Anxiety   . Aortic stenosis 04/15/2011  . Dizziness 04/21/2014  . Dyspnea   . Heart murmur   . HNP (herniated nucleus pulposus), lumbar    L5-S1  . Hyperglycemia 10/20/2014  . Hyperlipidemia   . Hypertension   . Hypothyroidism   . Left bundle branch block (LBBB) 12/18/2017  . Lumbar stenosis   . Macular edema    Dr  Sherlynn Stalls   . 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 Spinal stenosis of lumbar region 04/23/2018  . HNP (herniated nucleus pulposus), lumbar 04/23/2018  . Prolapsed lumbar disc 03/03/2018  . Wide-complex tachycardia ([place]) 12/30/2017  . Left bundle branch block (LBBB) 12/18/2017  . 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 Abnormal LFTs   . Macular edema   . PCP NOTES >>>>>> 04/27/2015  . Hyperglycemia 10/20/2014  . Dizziness 04/21/2014  . Annual physical exam 04/15/2011  . Aortic stenosis 04/15/2011  . Hypothyroidism 10/13/2007  . Hyperlipidemia 10/13/2007  . Hypertension 04/08/2007    Past  Surgical History:  Procedure Laterality Date  . AORTIC VALVE REPLACEMENT N/A 12/17/2017   Procedure: MINIMALLY INVASIVE AORTIC VALVE REPLACEMENT (AVR) [ using Edwards Intuity Valve Size 8mm;  Surgeon: Rexene Alberts, MD;  Location: [place];  Service: Open Heart Surgery;  Laterality: N/A;  MINI THORACOTOMY  . CARDIAC CATHETERIZATION  11/10/00  . CATARACT EXTRACTION W/ INTRAOCULAR LENS IMPLANT     RIGHT EYE  . CHOLECYSTECTOMY    . EYE SURGERY     Right eye cataract  . INGUINAL HERNIA REPAIR     (B)  . LUMBAR LAMINECTOMY/DECOMPRESSION MICRODISCECTOMY N/A 04/23/2018   Procedure: Microlumbar decompression Lumbar five-Sacral one;  Surgeon: Susa Day, MD;  Location: [facility];  Service: Orthopedics;  Laterality: N/A;  . POLYPECTOMY    . RIGHT/LEFT HEART CATH AND CORONARY ANGIOGRAPHY N/A 10/16/2017   Procedure: RIGHT/LEFT HEART CATH AND CORONARY ANGIOGRAPHY;  Surgeon: [name], MD;  Location: [facility];  Service: Cardiovascular;  Laterality: N/A;  . TEE WITHOUT CARDIOVERSION N/A 12/17/2017   Procedure: TRANSESOPHAGEAL ECHOCARDIOGRAM (TEE);  Surgeon: Rexene Alberts, MD;  Location: [facility];  Service: Open Heart Surgery;  Laterality: N/A;  . TONSILLECTOMY          Home Medications    Prior to Admission medications   Medication Sig Start Date End Date Taking? Authorizing Provider  amLODipine (NORVASC) 5 MG tablet TAKE 1 TABLET BY MOUTH EVERY DAY 08/21/18   [name], MD  aspirin EC 81 MG tablet  Take 1 tablet (81 mg total) by mouth daily. Resume 5 days postop 04/23/18   Susa Day, MD  carvedilol (COREG) 12.5 MG tablet TAKE 1 TABLET (12.5 MG TOTAL) BY MOUTH 2 (TWO) TIMES DAILY WITH A MEAL. 06/23/18   [name], MD  folic acid (FOLVITE) 1 MG tablet Take 2 tablets (2 mg total) by mouth daily. 09/25/18   [name], MD  levothyroxine (SYNTHROID, LEVOTHROID) 88 MCG tablet Take 1 tablet (88 mcg total) by mouth daily before breakfast. 06/24/18   Colon Branch, MD   methotrexate (RHEUMATREX) 2.5 MG tablet Take 4 tabs po x 2 weeks, 6 tabs po 2 weeks then 8 tabs po weekly. Caution:Chemotherapy. Protect from light. 09/25/18   [name], MD  predniSONE (DELTASONE) 10 MG tablet Take 1 tablet (10 mg total) by mouth daily with breakfast. 09/15/18   [name], MD  predniSONE (DELTASONE) 5 MG tablet Take 1 tablet (5 mg total) by mouth daily with breakfast. Take 1 tablet with 10 mg tablet. 09/15/18   [name], MD  spironolactone (ALDACTONE) 25 MG tablet Take 1 tablet (25 mg total) by mouth daily. 10/08/18 01/06/19  [name], MD  telmisartan (MICARDIS) 80 MG tablet Take 0.5 tablet (40 mg) daily in the morning. Take an extra 0.5 tablet (40 mg) in the afternoon on Monday and Thursday only. 10/05/18   [name], MD  Vitamin D, Ergocalciferol, (DRISDOL) 1.25 MG (50000 UT) CAPS capsule Take 1 capsule (50,000 Units total) by mouth every 7 (seven) days. 07/23/18   Colon Branch, MD    Family History Family History  Problem Relation Age of Onset  . Diabetes Mother   . Stroke Mother   . Diabetes Brother        ?  . Coronary artery disease Brother        Male 1st degree relative >50, had CABG in his 95s  . Emphysema Father   . COPD Father   . Diabetes Daughter   . Colon cancer Neg Hx   . Prostate cancer Neg Hx   . Colon polyps Neg Hx   . Esophageal cancer Neg Hx   . Rectal cancer Neg Hx   . Stomach cancer Neg Hx     Social History Social History   Tobacco Use  . Smoking status: Former Smoker    Last attempt to quit: 04/15/1990    Years since quitting: 28.5  . Smokeless tobacco: Never Used  . Tobacco comment: used to smoke 2 ppd  Substance Use Topics  . Alcohol use: Not Currently    Comment: no ETOH since the 90s  . Drug use: No     Allergies   Atorvastatin and Ezetimibe   Review of Systems Review of Systems  Respiratory: Negative for shortness of breath.   Cardiovascular: Negative for chest pain.  Neurological: Negative  for headaches.  All other systems reviewed and are negative.    Physical Exam Updated Vital Signs BP (!) 201/95 (BP Location: Right Arm)   Pulse 77   Temp 98.6 F (37 C) (Oral)   Resp 18   Ht 5' 8.5" (1.74 m)   Wt 85.7 kg   SpO2 98%   BMI 28.32 kg/m   Physical Exam Vitals signs and nursing note reviewed.  Constitutional:      Appearance: He is well-developed.  HENT:     Head: Normocephalic and atraumatic.     Comments: Inverted T-shaped laceration to right eyebrow/forehead, oozing blood. Symmetric and  equal eyebrow raise prior to and after sutures Eyes:     Extraocular Movements: Extraocular movements intact.     Conjunctiva/sclera: Conjunctivae normal.  Neck:     Musculoskeletal: Normal range of motion.  Cardiovascular:     Rate and Rhythm: Normal rate.  Pulmonary:     Effort: Pulmonary effort is normal. No respiratory distress.  Abdominal:     General: There is no distension.  Musculoskeletal: Normal range of motion.  Neurological:     General: No focal deficit present.     Mental Status: He is alert and oriented to person, place, and time. Mental status is at baseline.     Cranial Nerves: No cranial nerve deficit.     Sensory: No sensory deficit.     Motor: No weakness.     Coordination: Coordination normal.     Gait: Gait normal.     Deep Tendon Reflexes: Reflexes normal.      ED Treatments / Results  Labs (all labs ordered are listed, but only abnormal results are displayed) Labs Reviewed - No data to display  EKG None  Radiology No results found.  Procedures .Marland KitchenLaceration Repair Date/Time: 10/11/2018 6:46 AM Performed by: Merrily Pew, MD Authorized by: Merrily Pew, MD   Consent:    Consent obtained:  Verbal   Consent given by:  Patient   Risks discussed:  Infection, need for additional repair, nerve damage, poor wound healing, poor cosmetic result, pain, retained foreign body, tendon damage and vascular damage   Alternatives discussed:   No treatment, delayed treatment and observation Anesthesia (see MAR for exact dosages):    Anesthesia method:  Local infiltration   Local anesthetic:  Lidocaine 1% w/o epi Laceration details:    Location:  Face   Face location:  R eyebrow   Wound length (cm): 3 cm vertically and 2 cm horiznotally through eyebrow.   Depth (mm):  4 Repair type:    Repair type:  Simple Pre-procedure details:    Preparation:  Patient was prepped and draped in usual sterile fashion and imaging obtained to evaluate for foreign bodies Exploration:    Hemostasis achieved with:  Direct pressure   Wound exploration: wound explored through full range of motion and entire depth of wound probed and visualized     Contaminated: no   Treatment:    Area cleansed with:  Saline   Amount of cleaning:  Extensive   Irrigation solution:  Sterile water   Irrigation method:  Syringe   Visualized foreign bodies/material removed: no   Skin repair:    Repair method:  Sutures   Suture size:  5-0   Wound skin closure material used: vicryl rapide.   Suture technique:  Simple interrupted   Number of sutures:  9 Approximation:    Approximation:  Loose (one area left loose as it seemed there was some skin missing. approximating it would severely distort his skin.) Post-procedure details:    Patient tolerance of procedure:  Tolerated well, no immediate complications   (including critical care time)  Medications Ordered in ED Medications  lidocaine (XYLOCAINE) 1 % (with pres) injection (  Given by Other 10/11/18 0606)  Tdap (BOOSTRIX) injection 0.5 mL (0.5 mLs Intramuscular Given 10/11/18 2993)     Initial Impression / Assessment and Plan / ED Course  I have reviewed the triage vital signs and the nursing notes.  Pertinent labs & imaging results that were available during my care of the patient were reviewed by me and considered in  my medical decision making (see chart for details).  Patient rule out of bed and hit his  eyebrow on his nightstand causing a laceration as described above.  No syncope, headache, vision changes, nausea, vomiting or other associated symptoms to be concerned for intracranial bleed or severe head injury so CT scan was not done.  Also not on blood thinners.  Patient was hypertensive here but he will recheck that.  Final Clinical Impressions(s) / ED Diagnoses   Final diagnoses:  Laceration of eyebrow and forehead, right, initial encounter  Hypertension, unspecified type    ED Discharge Orders    None       Azjah Pardo, Corene Cornea, MD 10/11/18 [phone]

## 2018-10-11 NOTE — ED Triage Notes (Addendum)
Pt states he rolled over and fell out of the bed hitting his head on the corner of a table. Denies loc.Approximately one inch vertical laceration about right eyebrow. Bleeding controlled.

## 2018-10-12 ENCOUNTER — Ambulatory Visit (INDEPENDENT_AMBULATORY_CARE_PROVIDER_SITE_OTHER): Payer: Medicare Other | Admitting: Internal Medicine

## 2018-10-12 ENCOUNTER — Other Ambulatory Visit: Payer: Self-pay | Admitting: Rheumatology

## 2018-10-12 ENCOUNTER — Encounter: Payer: Self-pay | Admitting: Internal Medicine

## 2018-10-12 ENCOUNTER — Other Ambulatory Visit: Payer: Self-pay | Admitting: Cardiology

## 2018-10-12 DIAGNOSIS — S0990XD Unspecified injury of head, subsequent encounter: Secondary | ICD-10-CM | POA: Diagnosis not present

## 2018-10-12 DIAGNOSIS — I10 Essential (primary) hypertension: Secondary | ICD-10-CM | POA: Diagnosis not present

## 2018-10-12 DIAGNOSIS — M2550 Pain in unspecified joint: Secondary | ICD-10-CM | POA: Diagnosis not present

## 2018-10-12 NOTE — Telephone Encounter (Signed)
Rx refill sent to pharmacy. 

## 2018-10-12 NOTE — Telephone Encounter (Signed)
Last Visit: 09/25/2018 telemedicine  Next Visit: front desk has message to schedule.   Okay to refill per Dr. Estanislado Pandy.

## 2018-10-12 NOTE — Progress Notes (Signed)
Subjective:    Patient ID: Eric [name], male    DOB: [birth year], 73 y.o.   MRN: [id number]  DOS:  10/12/2018 Type of visit - description: Virtual Visit via Video Note  I connected with@ on 10/13/18 at  9:20 AM EDT by a video enabled telemedicine application and verified that I am speaking with the correct person using two identifiers.   THIS ENCOUNTER IS A VIRTUAL VISIT DUE TO COVID-19 - PATIENT WAS NOT SEEN IN THE OFFICE. PATIENT HAS CONSENTED TO VIRTUAL VISIT / TELEMEDICINE VISIT   Location of patient: home  Location of provider: office  I discussed the limitations of evaluation and management by telemedicine and the availability of in person appointments. The patient expressed understanding and agreed to proceed.  History of Present Illness: ER follow-up The patient rolled out of his bed and hit his face on the nightstand, went to the ER yesterday around 4 AM, he was evaluated, determined that he did not have any major injury, the laceration of the eyebrow was a stitch and he was released. His BP was 201/95.  Since the ER visit, the patient is feeling well.  BP gradually decrease to 160, 140, and eventually 138/76 last night. Today, is 164/90.   He did report mild neck discomfort without radiation or paresthesias   Review of Systems Denies headaches, dizziness, double vision. He remains  slightly stressed about the incident. No nausea or vomiting The wife is with him she is very supportive.  Past Medical History:  Diagnosis Date  . Abnormal LFTs   . Anxiety   . Aortic stenosis 04/15/2011  . Dizziness 04/21/2014  . Dyspnea   . Heart murmur   . HNP (herniated nucleus pulposus), lumbar    L5-S1  . Hyperglycemia 10/20/2014  . Hyperlipidemia   . Hypertension   . Hypothyroidism   . Left bundle branch block (LBBB) 12/18/2017  . Lumbar stenosis   . Macular edema    Dr  Sherlynn Stalls   . 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 12/17/2017    Edwards Intuity Elite rapid deployment stented bovine pericardial tissue valve via right mini thoracotomy approach    Past Surgical History:  Procedure Laterality Date  . AORTIC VALVE REPLACEMENT N/A 12/17/2017   Procedure: MINIMALLY INVASIVE AORTIC VALVE REPLACEMENT (AVR) [ using Edwards Intuity Valve Size 9mm;  Surgeon: Rexene Alberts, MD;  Location: [facility];  Service: Open Heart Surgery;  Laterality: N/A;  MINI THORACOTOMY  . CARDIAC CATHETERIZATION  11/10/00  . CATARACT EXTRACTION W/ INTRAOCULAR LENS IMPLANT     RIGHT EYE  . CHOLECYSTECTOMY    . EYE SURGERY     Right eye cataract  . INGUINAL HERNIA REPAIR     (B)  . LUMBAR LAMINECTOMY/DECOMPRESSION MICRODISCECTOMY N/A 04/23/2018   Procedure: Microlumbar decompression Lumbar five-Sacral one;  Surgeon: Susa Day, MD;  Location: [facility];  Service: Orthopedics;  Laterality: N/A;  . POLYPECTOMY    . RIGHT/LEFT HEART CATH AND CORONARY ANGIOGRAPHY N/A 10/16/2017   Procedure: RIGHT/LEFT HEART CATH AND CORONARY ANGIOGRAPHY;  Surgeon: [name], MD;  Location: [facility];  Service: Cardiovascular;  Laterality: N/A;  . TEE WITHOUT CARDIOVERSION N/A 12/17/2017   Procedure: TRANSESOPHAGEAL ECHOCARDIOGRAM (TEE);  Surgeon: Rexene Alberts, MD;  Location: [facility];  Service: Open Heart Surgery;  Laterality: N/A;  . TONSILLECTOMY      Social History   Socioeconomic History  . Marital status: Married    Spouse name: Not on file  .  Number of children: 3  . Years of education: Not on file  . Highest education level: Not on file  Occupational History  . Occupation: retired- used to work for [employer]: 05/2008  Social Needs  . Financial resource strain: Not on file  . Food insecurity:    Worry: Not on file    Inability: Not on file  . Transportation needs:    Medical: Not on file    Non-medical: Not on file  Tobacco Use  . Smoking status: Former Smoker    Last attempt to quit: 04/15/1990    Years since quitting: 28.5   . Smokeless tobacco: Never Used  . Tobacco comment: used to smoke 2 ppd  Substance and Sexual Activity  . Alcohol use: Not Currently    Comment: no ETOH since the 90s  . Drug use: No  . Sexual activity: Not on file  Lifestyle  . Physical activity:    Days per week: Not on file    Minutes per session: Not on file  . Stress: Not on file  Relationships  . Social connections:    Talks on phone: Not on file    Gets together: Not on file    Attends religious service: Not on file    Active member of club or organization: Not on file    Attends meetings of clubs or organizations: Not on file    Relationship status: Not on file  . Intimate partner violence:    Fear of current or ex partner: Not on file    Emotionally abused: Not on file    Physically abused: Not on file    Forced sexual activity: Not on file  Other Topics Concern  . Not on file  Social History Narrative   Lives w/ wife   3 children, 8 g-kids                Allergies as of 10/12/2018      Reactions   Atorvastatin Other (See Comments)   REACTION: myalgia   Ezetimibe Other (See Comments)   REACTION: myalgia      Medication List       Accurate as of Oct 12, 2018 11:59 PM. Always use your most recent med list.        amLODipine 5 MG tablet Commonly known as:  NORVASC TAKE 1 TABLET BY MOUTH EVERY DAY   aspirin EC 81 MG tablet Take 1 tablet (81 mg total) by mouth daily. Resume 5 days postop   carvedilol 12.5 MG tablet Commonly known as:  COREG TAKE 1 TABLET (12.5 MG TOTAL) BY MOUTH 2 (TWO) TIMES DAILY WITH A MEAL.   folic acid 1 MG tablet Commonly known as:  FOLVITE Take 2 tablets (2 mg total) by mouth daily.   levothyroxine 88 MCG tablet Commonly known as:  SYNTHROID Take 1 tablet (88 mcg total) by mouth daily before breakfast.   methotrexate 2.5 MG tablet Commonly known as:  RHEUMATREX Take 4 tabs po x 2 weeks, 6 tabs po 2 weeks then 8 tabs po weekly. Caution:Chemotherapy. Protect from light.    predniSONE 5 MG tablet Commonly known as:  DELTASONE Take 1 tablet (5 mg total) by mouth daily with breakfast. Take 1 tablet with 10 mg tablet.   predniSONE 10 MG tablet Commonly known as:  DELTASONE TAKE 1 TABLET (10 MG TOTAL) BY MOUTH DAILY WITH BREAKFAST.   spironolactone 25 MG tablet Commonly known as:  ALDACTONE Take 1 tablet (  25 mg total) by mouth daily.   telmisartan 80 MG tablet Commonly known as:  MICARDIS TAKE 1/2 TABLET (40 MG TOTAL) BY MOUTH DAILY in A.M. AND 1/2 TABLET (40 MG TOTAL) BY MOUTH IN AFTERNOON ON Monday & Thursday also.   Vitamin D (Ergocalciferol) 1.25 MG (50000 UT) Caps capsule Commonly known as:  DRISDOL Take 1 capsule (50,000 Units total) by mouth every 7 (seven) days.           Objective:   Physical Exam There were no vitals taken for this visit. This is a video conference He has Ace wrap around his head, the right eye has dark  ecchymosis around it.  Speech is clear, alert oriented x3.    Assessment     Assessment HTN Hyperlipidemia Hypothyroidism Increase LFTs Ultrasound 2002 done for increased LFTs: Normal liver; Hep C negative 02-16-15 Aortic stenosis , dx  2012 , s/p AoVR 12/2017 Macular degeneration, cataracts  +FH CAD  --Pt's Brother has CAD dx age 44s --Patient himself has a cardiac catheterization with minimal to no disease 10/2017  PLAN Head injury: As described above, no evidence of syncope on clinical grounds, apparently he simply fall from his bed. No evidence of head concusion. Had laceration, it was repaired at the ER, stitches will dissolve in few days, advised to call if he sees any evidence of infection at the area (redness, swelling, discharge). He has mild neck pain, probably related to the injury, recommend Tylenol plus observation, call me if not better HTN: BP was very high at the emergency room, I think that is understandable, several hours after BP went down to 138/76 and this morning is slightly elevated.   Recommend to continue monitoring BPs and call if not at goal around 120/80. Polyarthritis: Since the last visit, was seen by rheumatology, symptoms presumably RA, on the steroids and MTX Otherwise, RTC in June as a schedule.

## 2018-10-13 NOTE — Assessment & Plan Note (Signed)
Head injury: As described above, no evidence of syncope on clinical grounds, apparently he simply fall from his bed. No evidence of head concusion. Had laceration, it was repaired at the ER, stitches will dissolve in few days, advised to call if he sees any evidence of infection at the area (redness, swelling, discharge). He has mild neck pain, probably related to the injury, recommend Tylenol plus observation, call me if not better HTN: BP was very high at the emergency room, I think that is understandable, several hours after BP went down to 138/76 and this morning is slightly elevated.  Recommend to continue monitoring BPs and call if not at goal around 120/80. Polyarthritis: Since the last visit, was seen by rheumatology, symptoms presumably RA, on the steroids and MTX Otherwise, RTC in June as a schedule.

## 2018-10-16 ENCOUNTER — Other Ambulatory Visit: Payer: Self-pay | Admitting: Cardiology

## 2018-10-21 ENCOUNTER — Other Ambulatory Visit: Payer: Self-pay | Admitting: *Deleted

## 2018-10-21 DIAGNOSIS — Z79899 Other long term (current) drug therapy: Secondary | ICD-10-CM

## 2018-10-22 LAB — COMPLETE METABOLIC PANEL WITH GFR
AG Ratio: 1.6 (calc) (ref 1.0–2.5)
ALT: 17 U/L (ref 9–46)
AST: 22 U/L (ref 10–35)
Albumin: 4.1 g/dL (ref 3.6–5.1)
Alkaline phosphatase (APISO): 50 U/L (ref 35–144)
BUN/Creatinine Ratio: 12 (calc) (ref 6–22)
BUN: 15 mg/dL (ref 7–25)
CO2: 29 mmol/L (ref 20–32)
Calcium: 9.6 mg/dL (ref 8.6–10.3)
Chloride: 103 mmol/L (ref 98–110)
Creat: 1.24 mg/dL — ABNORMAL HIGH (ref 0.70–1.18)
GFR, Est African American: 67 mL/min/{1.73_m2} (ref >=60)
GFR, Est Non African American: 58 mL/min/{1.73_m2} — ABNORMAL LOW (ref >=60)
Globulin: 2.5 g/dL (calc) (ref 1.9–3.7)
Glucose, Bld: 105 mg/dL (ref 65–139)
Potassium: 4.1 mmol/L (ref 3.5–5.3)
Sodium: 141 mmol/L (ref 135–146)
Total Bilirubin: 0.9 mg/dL (ref 0.2–1.2)
Total Protein: 6.6 g/dL (ref 6.1–8.1)

## 2018-10-22 LAB — CBC WITH DIFFERENTIAL/PLATELET
Absolute Monocytes: 546 cells/uL (ref 200–950)
Basophils Absolute: 20 cells/uL (ref 0–200)
Basophils Relative: 0.3 %
Eosinophils Absolute: 72 cells/uL (ref 15–500)
Eosinophils Relative: 1.1 %
HCT: 40.9 % (ref 38.5–50.0)
Hemoglobin: 13.8 g/dL (ref 13.2–17.1)
Lymphs Abs: 1768 cells/uL (ref 850–3900)
MCH: 31.2 pg (ref 27.0–33.0)
MCHC: 33.7 g/dL (ref 32.0–36.0)
MCV: 92.5 fL (ref 80.0–100.0)
MPV: 10.1 fL (ref 7.5–12.5)
Monocytes Relative: 8.4 %
Neutro Abs: 4095 cells/uL (ref 1500–7800)
Neutrophils Relative %: 63 %
Platelets: 227 10*3/uL (ref 140–400)
RBC: 4.42 10*6/uL (ref 4.20–5.80)
RDW: 15.7 % — ABNORMAL HIGH (ref 11.0–15.0)
Total Lymphocyte: 27.2 %
WBC: 6.5 10*3/uL (ref 3.8–10.8)

## 2018-10-22 NOTE — Progress Notes (Signed)
Patient reports that she has been taking methotrexate 6 tablets/week for the last 2 weeks.  He has noticed improvement in his joint swelling.  He was recently also placed on a diuretic.  Please advise him to reduce methotrexate to 4 tablets p.o. weekly.  Recheck labs in 1 month and then every 3 months.  He should continue prednisone taper as discussed.

## 2018-10-23 LAB — BASIC METABOLIC PANEL
BUN/Creatinine Ratio: 13 (ref 10–24)
BUN: 14 mg/dL (ref 8–27)
CO2: 25 mmol/L (ref 20–29)
Calcium: 9.5 mg/dL (ref 8.6–10.2)
Chloride: 101 mmol/L (ref 96–106)
Creatinine, Ser: 1.06 mg/dL (ref 0.76–1.27)
GFR calc Af Amer: 81 mL/min/{1.73_m2} (ref >59)
GFR calc non Af Amer: 70 mL/min/{1.73_m2} (ref >59)
Glucose: 99 mg/dL (ref 65–99)
Potassium: 3.9 mmol/L (ref 3.5–5.2)
Sodium: 141 mmol/L (ref 134–144)

## 2018-10-27 ENCOUNTER — Telehealth: Payer: Self-pay | Admitting: *Deleted

## 2018-10-27 NOTE — Telephone Encounter (Signed)
Attempted to contact patient with no answer on cell phone and unable to leave a message to let him know Dr. Bettina Gavia has reviewed his blood pressure log that he sent in. Dr. Bettina Gavia advised that patient's BP is better and that he will continue current medications as prescribed. Will continue efforts to inform him.

## 2018-10-28 ENCOUNTER — Other Ambulatory Visit: Payer: Self-pay | Admitting: Internal Medicine

## 2018-10-28 NOTE — Telephone Encounter (Signed)
Mychart message sent.

## 2018-10-28 NOTE — Telephone Encounter (Signed)
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 Call patient and let him know: Okay to go back on Crestor, prescription sent, if he noticed any increase in joint pain or muscle aches he is to let me know immediately and stop the medication.

## 2018-10-28 NOTE — Telephone Encounter (Signed)
Refill request for rosuvastatin- not on med list. Please advise.

## 2018-10-30 ENCOUNTER — Other Ambulatory Visit: Payer: Self-pay | Admitting: Rheumatology

## 2018-10-30 NOTE — Telephone Encounter (Signed)
Last Visit: 09/25/2018 telemedicine  Next Visit: due in July 2020 front desk has message to schedule.  Labs: 10/21/18 Creat. Elevated reduce methotrexate to 4 tablets p.o. weekly.   Okay to refill per Dr. Estanislado Pandy.

## 2018-11-04 ENCOUNTER — Other Ambulatory Visit: Payer: Self-pay | Admitting: Rheumatology

## 2018-11-04 ENCOUNTER — Other Ambulatory Visit: Payer: Self-pay | Admitting: *Deleted

## 2018-11-04 DIAGNOSIS — Z79899 Other long term (current) drug therapy: Secondary | ICD-10-CM

## 2018-11-04 MED ORDER — PREDNISONE 1 MG PO TABS: 4.0000 mg | ORAL_TABLET | Freq: Every day | ORAL | 0 refills | Status: DC

## 2018-11-04 NOTE — Telephone Encounter (Signed)
Last Visit:09/25/2018 telemedicine Next Visit:due in July 2020 front desk has message to schedule.  Patient states he is currently on Prednisone 10 mg and due to taper to 9 mg on 11/14/2018. Will send in 1 mg tablets for patient.   Okay to refill per Dr. Estanislado Pandy

## 2018-11-17 ENCOUNTER — Other Ambulatory Visit: Payer: Self-pay

## 2018-11-17 ENCOUNTER — Encounter: Payer: Self-pay | Admitting: Internal Medicine

## 2018-11-17 ENCOUNTER — Ambulatory Visit: Payer: Medicare Other | Admitting: Internal Medicine

## 2018-11-17 VITALS — BP 110/65 | HR 69 | Temp 98.1°F | Resp 16 | Ht 67.0 in | Wt 189.2 lb

## 2018-11-17 DIAGNOSIS — E038 Other specified hypothyroidism: Secondary | ICD-10-CM

## 2018-11-17 DIAGNOSIS — M069 Rheumatoid arthritis, unspecified: Secondary | ICD-10-CM

## 2018-11-17 DIAGNOSIS — Z79899 Other long term (current) drug therapy: Secondary | ICD-10-CM

## 2018-11-17 DIAGNOSIS — I10 Essential (primary) hypertension: Secondary | ICD-10-CM | POA: Diagnosis not present

## 2018-11-17 HISTORY — DX: Rheumatoid arthritis, unspecified: M06.9

## 2018-11-17 LAB — TSH: TSH: 1.85 u[IU]/mL (ref 0.35–4.50)

## 2018-11-17 NOTE — Progress Notes (Signed)
Pre visit review using our clinic review tool, if applicable. No additional management support is needed unless otherwise documented below in the visit note. 

## 2018-11-17 NOTE — Progress Notes (Signed)
Subjective:    Patient ID: Eric [name], male    DOB: Oct 26, 1945, 73 y.o.   MRN: [id number]  DOS:  11/17/2018 Type of visit - description: rov Rheumatoid arthritis : MSK pains decreasing, on methotrexate and prednisone.  Has noted some feet cramping with methotrexate. High cholesterol: Holding Crestor HTN: BPs were  slightly elevated, cardiology is adjusting his medications.  Review of Systems Denies fever chills No chest pain no difficulty breathing. Sometimes he gets slightly lightheaded when he stands up, he has checked his BP at those times  and BP was not low. No headache, diplopia, slurred speech or motor deficits  Past Medical History:  Diagnosis Date  . Abnormal LFTs   . Anxiety   . Aortic stenosis 04/15/2011  . Dizziness 04/21/2014  . Dyspnea   . Heart murmur   . HNP (herniated nucleus pulposus), lumbar    L5-S1  . Hyperglycemia 10/20/2014  . Hyperlipidemia   . Hypertension   . Hypothyroidism   . Left bundle branch block (LBBB) 12/18/2017  . Lumbar stenosis   . Macular edema    Dr  Sherlynn Stalls   . 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 AORTIC VALVE REPLACEMENT N/A 12/17/2017   Procedure: MINIMALLY INVASIVE AORTIC VALVE REPLACEMENT (AVR) [ using Edwards Intuity Valve Size 4mm;  Surgeon: Rexene Alberts, MD;  Location: [facility];  Service: Open Heart Surgery;  Laterality: N/A;  MINI THORACOTOMY  . CARDIAC CATHETERIZATION  11/10/00  . CATARACT EXTRACTION W/ INTRAOCULAR LENS IMPLANT     RIGHT EYE  . CHOLECYSTECTOMY    . EYE SURGERY     Right eye cataract  . INGUINAL HERNIA REPAIR     (B)  . LUMBAR LAMINECTOMY/DECOMPRESSION MICRODISCECTOMY N/A 04/23/2018   Procedure: Microlumbar decompression Lumbar five-Sacral one;  Surgeon: Susa Day, MD;   Location: [facility];  Service: Orthopedics;  Laterality: N/A;  . POLYPECTOMY    . RIGHT/LEFT HEART CATH AND CORONARY ANGIOGRAPHY N/A 10/16/2017   Procedure: RIGHT/LEFT HEART CATH AND CORONARY ANGIOGRAPHY;  Surgeon: [name], MD;  Location: [facility];  Service: Cardiovascular;  Laterality: N/A;  . TEE WITHOUT CARDIOVERSION N/A 12/17/2017   Procedure: TRANSESOPHAGEAL ECHOCARDIOGRAM (TEE);  Surgeon: Rexene Alberts, MD;  Location: [facility];  Service: Open Heart Surgery;  Laterality: N/A;  . TONSILLECTOMY      Social History   Socioeconomic History  . Marital status: Married    Spouse name: Not on file  . Number of children: 3  . Years of education: Not on file  . Highest education level: Not on file  Occupational History  . Occupation: retired- used to work for [employer]: 05/2008  Social Needs  . Financial resource strain: Not on file  . Food insecurity:    Worry: Not on file    Inability: Not on file  . Transportation needs:    Medical: Not on file    Non-medical: Not on file  Tobacco Use  . Smoking status: Former Smoker    Last attempt to quit: 04/15/1990    Years since quitting: 28.6  . Smokeless tobacco: Never Used  . Tobacco comment: used to smoke 2 ppd  Substance and Sexual Activity  . Alcohol use: Not Currently    Comment: no ETOH since the 90s  .  Drug use: No  . Sexual activity: Not on file  Lifestyle  . Physical activity:    Days per week: Not on file    Minutes per session: Not on file  . Stress: Not on file  Relationships  . Social connections:    Talks on phone: Not on file    Gets together: Not on file    Attends religious service: Not on file    Active member of club or organization: Not on file    Attends meetings of clubs or organizations: Not on file    Relationship status: Not on file  . Intimate partner violence:    Fear of current or ex partner: Not on file    Emotionally abused: Not on file    Physically abused: Not on file     Forced sexual activity: Not on file  Other Topics Concern  . Not on file  Social History Narrative   Lives w/ wife   3 children, 8 g-kids                Allergies as of 11/17/2018      Reactions   Atorvastatin Other (See Comments)   REACTION: myalgia   Ezetimibe Other (See Comments)   REACTION: myalgia      Medication List       Accurate as of November 17, 2018  6:10 PM. If you have any questions, ask your nurse or doctor.        amLODipine 5 MG tablet Commonly known as:  NORVASC Take 0.5 tablets (2.5 mg total) by mouth daily. What changed:  how much to take Changed by:  Kathlene November, MD   aspirin EC 81 MG tablet Take 1 tablet (81 mg total) by mouth daily. Resume 5 days postop   carvedilol 12.5 MG tablet Commonly known as:  COREG TAKE 1 TABLET (12.5 MG TOTAL) BY MOUTH 2 (TWO) TIMES DAILY WITH A MEAL.   folic acid 1 MG tablet Commonly known as:  FOLVITE Take 2 tablets (2 mg total) by mouth daily.   levothyroxine 88 MCG tablet Commonly known as:  SYNTHROID Take 1 tablet (88 mcg total) by mouth daily before breakfast.   methotrexate 2.5 MG tablet Commonly known as:  RHEUMATREX Take 4 tablets (10 mg total) by mouth once a week.   predniSONE 5 MG tablet Commonly known as:  DELTASONE Take 1 tablet (5 mg total) by mouth daily with breakfast. Take 1 tablet with 10 mg tablet. What changed:  Another medication with the same name was removed. Continue taking this medication, and follow the directions you see here. Changed by:  Kathlene November, MD   predniSONE 1 MG tablet Commonly known as:  DELTASONE Take 4 tablets (4 mg total) by mouth daily with breakfast. Take with 5 mg tablet. Follow taper schedule. What changed:  Another medication with the same name was removed. Continue taking this medication, and follow the directions you see here. Changed by:  Kathlene November, MD   rosuvastatin 5 MG tablet Commonly known as:  CRESTOR TAKE 1 TABLET BY MOUTH EVERY DAY   spironolactone 25 MG  tablet Commonly known as:  ALDACTONE Take 1 tablet (25 mg total) by mouth daily.   telmisartan 80 MG tablet Commonly known as:  MICARDIS TAKE 1/2 TABLET (40 MG TOTAL) BY MOUTH DAILY in A.M. AND 1/2 TABLET (40 MG TOTAL) BY MOUTH IN AFTERNOON ON Monday & Thursday also.   Vitamin D (Ergocalciferol) 1.25 MG (50000 UT) Caps capsule Commonly  known as:  DRISDOL Take 1 capsule (50,000 Units total) by mouth every 7 (seven) days.           Objective:   Physical Exam BP 110/65 (BP Location: Right Arm, Patient Position: Sitting, Cuff Size: Small)   Pulse 69   Temp 98.1 F (36.7 C) (Oral)   Resp 16   Ht 5\' 7"  (1.702 m)   Wt 189 lb 4 oz (85.8 kg)   SpO2 98%   BMI 29.64 kg/m      General:   Well developed, NAD, BMI noted. HEENT:  Normocephalic . Face symmetric, atraumatic Lungs:  CTA B Normal respiratory effort, no intercostal retractions, no accessory muscle use. Heart: RRR,  no murmur.  No pretibial edema bilaterally   Skin: Not pale. Not jaundice Neurologic:  alert & oriented X3.  Speech normal, gait appropriate for age and unassisted Psych--  Cognition and judgment appear intact.  Cooperative with normal attention span and concentration.  Behavior appropriate. No anxious or depressed appearing.   Assessment       Assessment HTN Hyperlipidemia Hypothyroidism Increase LFTs Ultrasound 2002 done for increased LFTs: Normal liver; Hep C negative 02-16-15 Aortic stenosis , dx  2012 , s/p AoVR 12/2017 Macular degeneration, cataracts  +FH CAD  --Pt's Brother has CAD dx age 79s --Patient himself has a cardiac catheterization with minimal to no disease 10/2017  PLAN HTN: BP was elevated, cardiology is adjusting his regimen, they added spironolactone October 08, 2018, subsequently creatinine increase but then came back to baseline.  Currently checking his BP twice a day, the majority of the times is in the 140s. Current regimen: -Amlodipine 5 mg every other day >>>> will change  to half tablet daily -Spironolactone 25 mg daily -Micardis  80 mg: 1/2 tablet daily.  On Monday on Thursday   takes an extra half tablet in the afternoon -Carvedilol 12.5 mg twice a day Recommend to continue monitoring BPs, call with readings in 1 month Rheumatoid arthritis: Sx improving on MTX and prednisone.  Did notice some feet cramps with methotrexate. High cholesterol: We will reintroduce Crestor, watch for unusual aches and pains Hypothyroidism: On Synthroid, check TSH RTC 3 to 4 months

## 2018-11-17 NOTE — Assessment & Plan Note (Signed)
HTN: BP was elevated, cardiology is adjusting his regimen, they added spironolactone October 08, 2018, subsequently creatinine increase but then came back to baseline.  Currently checking his BP twice a day, the majority of the times is in the 140s. Current regimen: -Amlodipine 5 mg every other day >>>> will change to half tablet daily -Spironolactone 25 mg daily -Micardis  80 mg: 1/2 tablet daily.  On Monday on Thursday   takes an extra half tablet in the afternoon -Carvedilol 12.5 mg twice a day Recommend to continue monitoring BPs, call with readings in 1 month Rheumatoid arthritis: Sx improving on MTX and prednisone.  Did notice some feet cramps with methotrexate. High cholesterol: We will reintroduce Crestor, watch for unusual aches and pains Hypothyroidism: On Synthroid, check TSH RTC 3 to 4 months

## 2018-11-17 NOTE — Patient Instructions (Addendum)
Get the blood work     [name] Schedule your next appointment  For a check up in 3-4 months    Restart Crestor, watch for side effects  Check the  blood pressure   daily GOAL is between 110/65 and  135/85. Call in 1 months with readings  Amlodipine 5 mg : half tablet daily Spironolactone 25 mg daily Micardis  80 mg: 1/2 tablet daily.  On Monday on Thursday   takes an extra half tablet in the afternoon Carvedilol 12.5 mg twice a day

## 2018-11-18 LAB — CBC WITH DIFFERENTIAL/PLATELET
Absolute Monocytes: 575 cells/uL (ref 200–950)
Basophils Absolute: 28 cells/uL (ref 0–200)
Basophils Relative: 0.4 %
Eosinophils Absolute: 57 cells/uL (ref 15–500)
Eosinophils Relative: 0.8 %
HCT: 38.5 % (ref 38.5–50.0)
Hemoglobin: 13.2 g/dL (ref 13.2–17.1)
Lymphs Abs: 1832 cells/uL (ref 850–3900)
MCH: 32.4 pg (ref 27.0–33.0)
MCHC: 34.3 g/dL (ref 32.0–36.0)
MCV: 94.6 fL (ref 80.0–100.0)
MPV: 10.2 fL (ref 7.5–12.5)
Monocytes Relative: 8.1 %
Neutro Abs: 4608 cells/uL (ref 1500–7800)
Neutrophils Relative %: 64.9 %
Platelets: 226 10*3/uL (ref 140–400)
RBC: 4.07 10*6/uL — ABNORMAL LOW (ref 4.20–5.80)
RDW: 15.8 % — ABNORMAL HIGH (ref 11.0–15.0)
Total Lymphocyte: 25.8 %
WBC: 7.1 10*3/uL (ref 3.8–10.8)

## 2018-11-18 LAB — COMPLETE METABOLIC PANEL WITH GFR
AG Ratio: 1.8 (calc) (ref 1.0–2.5)
ALT: 14 U/L (ref 9–46)
AST: 18 U/L (ref 10–35)
Albumin: 3.9 g/dL (ref 3.6–5.1)
Alkaline phosphatase (APISO): 41 U/L (ref 35–144)
BUN/Creatinine Ratio: 14 (calc) (ref 6–22)
BUN: 19 mg/dL (ref 7–25)
CO2: 26 mmol/L (ref 20–32)
Calcium: 9.2 mg/dL (ref 8.6–10.3)
Chloride: 105 mmol/L (ref 98–110)
Creat: 1.33 mg/dL — ABNORMAL HIGH (ref 0.70–1.18)
GFR, Est African American: 61 mL/min/{1.73_m2} (ref >=60)
GFR, Est Non African American: 53 mL/min/{1.73_m2} — ABNORMAL LOW (ref >=60)
Globulin: 2.2 g/dL (calc) (ref 1.9–3.7)
Glucose, Bld: 88 mg/dL (ref 65–99)
Potassium: 3.8 mmol/L (ref 3.5–5.3)
Sodium: 140 mmol/L (ref 135–146)
Total Bilirubin: 0.7 mg/dL (ref 0.2–1.2)
Total Protein: 6.1 g/dL (ref 6.1–8.1)

## 2018-11-18 NOTE — Progress Notes (Signed)
Cr is mildly elevated , most likely due to diuretic use.  He is on methotrexate 4 tablets p.o. weekly.  We will continue to monitor labs.

## 2018-11-23 ENCOUNTER — Encounter: Payer: Medicare Other | Admitting: Thoracic Surgery (Cardiothoracic Vascular Surgery)

## 2018-11-29 ENCOUNTER — Other Ambulatory Visit: Payer: Self-pay | Admitting: Rheumatology

## 2018-11-30 ENCOUNTER — Ambulatory Visit: Payer: Medicare Other | Admitting: Thoracic Surgery (Cardiothoracic Vascular Surgery)

## 2018-11-30 ENCOUNTER — Encounter: Payer: Self-pay | Admitting: Thoracic Surgery (Cardiothoracic Vascular Surgery)

## 2018-11-30 ENCOUNTER — Other Ambulatory Visit: Payer: Self-pay

## 2018-11-30 VITALS — BP 135/79 | HR 76 | Temp 97.7°F | Resp 20 | Ht 67.0 in | Wt 188.0 lb

## 2018-11-30 DIAGNOSIS — I350 Nonrheumatic aortic (valve) stenosis: Secondary | ICD-10-CM

## 2018-11-30 DIAGNOSIS — Z953 Presence of xenogenic heart valve: Secondary | ICD-10-CM

## 2018-11-30 MED ORDER — PREDNISONE 5 MG PO TABS: 5.0000 mg | ORAL_TABLET | Freq: Every day | ORAL | 0 refills | Status: DC

## 2018-11-30 NOTE — Progress Notes (Signed)
[address]       [place] [address]             [phone]     CARDIOTHORACIC SURGERY OFFICE NOTE  Primary Cardiologist is Shirlee More, MD PCP is Colon Branch, MD   HPI:  Patient is a 73 year old male with history of aortic stenosis, hypertension, hyperlipidemia, and hypothyroidism who returns to the office today for routine follow-up approximately 1 year status post aortic valve replacement using a rapid deployment stented bovine pericardial tissue valve on December 17, 2017 for severe symptomatic aortic stenosis.  Routine follow-up echocardiogram performed December 31, 2017 revealed normal left ventricular systolic function with normal functioning Intuity Elite rapid deployment bioprosthetic tissue valve in the aortic position.  There was no aortic insufficiency.  Mean transvalvular gradient across the aortic valve was estimated 9 mmHg.  He was last seen in our office on March 23, 2018 at which time he was doing well.    Patient returns to our office today and reports that overall he is doing fairly well.  Last December he underwent back surgery and came through brilliantly.  He states that the surgery helped him considerably.  However, since then he has developed progressive severe arthritis and was diagnosed with rheumatoid arthritis.  He is now on methotrexate and steroids.  Because of this recently has had some increased problems keeping his blood pressure under control on both Dr. Bettina Gavia and Dr. Larose Kells have been attending to getting his medications straightened out.  He has otherwise not had any cardiac problems.  He states that in retrospect his breathing and his exercise tolerance is noticeably "much improved" in comparison with prior to his heart surgery.  He states that he only gets short of breath with more strenuous physical exertion and he is not limited by breathing in any way.  He does not experience any chest pain or chest tightness.  Overall he is pleased with his  outcome.   Current Outpatient Medications  Medication Sig Dispense Refill   amLODipine (NORVASC) 5 MG tablet Take 0.5 tablets (2.5 mg total) by mouth daily.     aspirin EC 81 MG tablet Take 1 tablet (81 mg total) by mouth daily. Resume 5 days postop 90 tablet 3   carvedilol (COREG) 12.5 MG tablet TAKE 1 TABLET (12.5 MG TOTAL) BY MOUTH 2 (TWO) TIMES DAILY WITH A MEAL. 180 tablet 1   Cholecalciferol (VITAMIN D-3 PO) Take 2,000 Units by mouth daily.     folic acid (FOLVITE) 1 MG tablet Take 2 tablets (2 mg total) by mouth daily. 180 tablet 3   levothyroxine (SYNTHROID, LEVOTHROID) 88 MCG tablet Take 1 tablet (88 mcg total) by mouth daily before breakfast. 90 tablet 1   methotrexate (RHEUMATREX) 2.5 MG tablet Take 4 tablets (10 mg total) by mouth once a week. 48 tablet 0   predniSONE (DELTASONE) 1 MG tablet Take 3 tablets (3 mg total) by mouth daily with breakfast. Take with 5 mg Tab, follow prednisone taper as scheduled. 90 tablet 0   predniSONE (DELTASONE) 5 MG tablet Take 1 tablet (5 mg total) by mouth daily with breakfast. Take 1 tablet with 10 mg tablet. 30 tablet 0   predniSONE (DELTASONE) 5 MG tablet Take 1 tablet (5 mg total) by mouth daily with breakfast. 30 tablet 0   rosuvastatin (CRESTOR) 5 MG tablet TAKE 1 TABLET BY MOUTH EVERY DAY 90 tablet 1   spironolactone (ALDACTONE) 25 MG tablet Take 1  tablet (25 mg total) by mouth daily. 30 tablet 3   telmisartan (MICARDIS) 80 MG tablet TAKE 1/2 TABLET (40 MG TOTAL) BY MOUTH DAILY in A.M. AND 1/2 TABLET (40 MG TOTAL) BY MOUTH IN AFTERNOON ON Monday & Thursday also. 19 tablet 5   No current facility-administered medications for this visit.       Physical Exam:   BP 135/79    Pulse 76    Temp 97.7 F (36.5 C) (Skin)    Resp 20    Ht 5\' 7"  (1.702 m)    Wt 188 lb (85.3 kg)    SpO2 96% Comment: RA   BMI 29.44 kg/m   General:  Well-appearing  Chest:   Clear to auscultation  CV:   Regular rate and rhythm without  murmur  Incisions:  Completely healed  Abdomen:  Soft nontender  Extremities:  Warm and well-perfused  Diagnostic Tests:  n/a   Impression:  Patient is doing well from a cardiac standpoint approximately 1 year status post minimally invasive aortic valve replacement using a rapid deployment stented bioprosthetic tissue valve  Plan:  We have not recommended any change the patient's medications.  I have encouraged the patient to continue to increase his activity without limitations.  The patient has been reminded regarding the importance of dental hygiene and the lifelong need for antibiotic prophylaxis for all dental cleanings and other related invasive procedures.  The patient will continue to follow-up with Dr. Bettina Gavia and Dr. Larose Kells and return to our office in the future only should specific problems or questions arise.   I spent in excess of 15 minutes during the conduct of this office consultation and >50% of this time involved direct face-to-face encounter with the patient for counseling and/or coordination of their care.   Valentina Gu. Roxy Manns, MD 11/30/2018 2:56 PM

## 2018-11-30 NOTE — Patient Instructions (Signed)

## 2018-11-30 NOTE — Telephone Encounter (Addendum)
Last Visit:09/25/2018 telemedicine Next Visit: 01/05/19  Patient states he is due to start tapering to 8 mg next month. Patient is also in need of 5 mg tablet of Prednisone.   Okay to refill per Dr. Estanislado Pandy

## 2018-12-11 ENCOUNTER — Other Ambulatory Visit: Payer: Self-pay | Admitting: Internal Medicine

## 2018-12-22 NOTE — Progress Notes (Signed)
Office Visit Note  Patient: Eric [name]             Date of Birth: 12/31/1945           MRN: [id number]             PCP: Colon Branch, MD Referring: Colon Branch, MD Visit Date: 01/05/2019 Occupation: @GUAROCC @  Subjective:  Medication monitoring    History of Present Illness: Eric [name] is a 73 y.o. male with history of seronegative rheumatoid arthritis. He is taking MTX 4 tablets po once weekly and folic acid 1 mg po daily.  He is taking prednisone 8 mg po daily and tapering by 1 mg every month.  He reports that he has been tolerating methotrexate well overall.  He states he does have some mild nausea a second third day after taking methotrexate.  He states that he has reduced his dose of methotrexate due to elevated creatinine.  He denies any joint pain or joint swelling currently.  He denies any morning stiffness.     Activities of Daily Living:  Patient reports morning stiffness for 0  minutes.   Patient Denies nocturnal pain.  Difficulty dressing/grooming: Denies Difficulty climbing stairs: Denies Difficulty getting out of chair: Denies Difficulty using hands for taps, buttons, cutlery, and/or writing: Denies  Review of Systems  Constitutional: Positive for fatigue. Negative for night sweats.  HENT: Negative for mouth sores, mouth dryness and nose dryness.   Eyes: Positive for redness (Right eye macular edema). Negative for dryness.  Respiratory: Negative for cough, hemoptysis, shortness of breath and difficulty breathing.   Cardiovascular: Negative for chest pain, palpitations, hypertension, irregular heartbeat and swelling in legs/feet.  Gastrointestinal: Positive for nausea (SE of MTX). Negative for blood in stool, constipation and diarrhea.  Endocrine: Negative for increased urination.  Genitourinary: Negative for painful urination.  Musculoskeletal: Negative for arthralgias, joint pain, joint swelling, myalgias, muscle weakness, morning stiffness, muscle tenderness  and myalgias.  Skin: Negative for color change, rash, hair loss, nodules/bumps, skin tightness, ulcers and sensitivity to sunlight.  Allergic/Immunologic: Negative for susceptible to infections.  Neurological: Negative for dizziness, fainting, memory loss, night sweats and weakness.  Hematological: Negative for swollen glands.  Psychiatric/Behavioral: Negative for depressed mood and sleep disturbance. The patient is not nervous/anxious.     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 12/17/2017   Abnormal LFTs    Macular edema    PCP NOTES >>>>>> 04/27/2015   Hyperglycemia 10/20/2014   Dizziness 04/21/2014   Annual physical exam 04/15/2011   Aortic stenosis 04/15/2011   Hypothyroidism 10/13/2007   Hyperlipidemia 10/13/2007   Hypertension 04/08/2007    Past Medical History:  Diagnosis Date   Abnormal LFTs    Anxiety    Aortic stenosis 04/15/2011   Dizziness 04/21/2014   Dyspnea    Heart murmur    HNP (herniated nucleus pulposus), lumbar    L5-S1   Hyperglycemia 10/20/2014   Hyperlipidemia    Hypertension    Hypothyroidism    Left bundle branch block (LBBB) 12/18/2017   Lumbar stenosis    Macular edema    Dr  Sherlynn Stalls    S/P  minimally invasive aortic valve replacement with bioprosthetic valve 12/17/2017   Edwards Intuity Elite rapid deployment stented bovine pericardial tissue valve via right mini thoracotomy approach    Family History  Problem Relation Age of Onset   Diabetes Mother    Stroke Mother    Diabetes Brother        ?   Coronary artery disease Brother         Male 1st degree relative >50, had CABG in his 77s   Emphysema Father    COPD Father    Diabetes Daughter    Colon cancer Neg Hx    Prostate cancer Neg Hx    Colon polyps Neg Hx    Esophageal cancer Neg Hx    Rectal cancer Neg Hx    Stomach cancer Neg Hx    Past Surgical History:  Procedure Laterality Date   AORTIC VALVE REPLACEMENT N/A 12/17/2017   Procedure: MINIMALLY INVASIVE AORTIC VALVE REPLACEMENT (AVR) [ using Edwards Intuity Valve Size 3mm;  Surgeon: Rexene Alberts, MD;  Location: [place];  Service: Open Heart Surgery;  Laterality: N/A;  MINI THORACOTOMY   CARDIAC CATHETERIZATION  11/10/00   CATARACT EXTRACTION W/ INTRAOCULAR LENS IMPLANT     RIGHT EYE   CHOLECYSTECTOMY     EYE SURGERY     Right eye cataract   INGUINAL HERNIA REPAIR     (B)   LUMBAR LAMINECTOMY/DECOMPRESSION MICRODISCECTOMY N/A 04/23/2018   Procedure: Microlumbar decompression Lumbar five-Sacral one;  Surgeon: Susa Day, MD;  Location: [facility];  Service: Orthopedics;  Laterality: N/A;   POLYPECTOMY     RIGHT/LEFT HEART CATH AND CORONARY ANGIOGRAPHY N/A 10/16/2017   Procedure: RIGHT/LEFT HEART CATH AND CORONARY ANGIOGRAPHY;  Surgeon: [name], MD;  Location: [place] [facility];  Service: Cardiovascular;  Laterality: N/A;   TEE WITHOUT CARDIOVERSION N/A 12/17/2017   Procedure: TRANSESOPHAGEAL ECHOCARDIOGRAM (TEE);  Surgeon: Rexene Alberts, MD;  Location: [facility];  Service: Open Heart Surgery;  Laterality: N/A;   TONSILLECTOMY     Social History   Social History Narrative   Lives w/ wife   3 children, 8 g-kids             Immunization History  Administered Date(s) Administered   Influenza Split 03/11/2013, 02/17/2014   Influenza Whole 04/13/2007, 03/15/2008, 03/10/2012   Influenza, High Dose Seasonal PF 04/04/2015, 03/28/2016, 03/18/2017, 03/16/2018   Pneumococcal Conjugate-13 04/21/2014   Pneumococcal Polysaccharide-23 04/15/2012   Td 06/10/1996, 06/10/2000   Tdap  04/15/2011, 10/11/2018   Zoster 03/29/2015     Objective: Vital Signs: BP 134/79 (BP Location: Left Arm, Patient Position: Sitting, Cuff Size: Normal)    Pulse 72    Resp 15    Ht 5' 8.5" (1.74 m)    Wt 192 lb (87.1 kg)    BMI 28.77 kg/m    Physical Exam Vitals signs and nursing note reviewed.  Constitutional:      Appearance: He is well-developed.  HENT:     Head: Normocephalic and atraumatic.  Eyes:     Conjunctiva/sclera: Conjunctivae normal.     Pupils: Pupils are equal, round, and reactive to light.  Neck:     Musculoskeletal: Normal range of motion and neck supple.  Cardiovascular:     Rate and Rhythm: Normal rate and regular rhythm.     Heart sounds: Normal heart sounds.  Pulmonary:     Effort: Pulmonary effort is normal.     Breath sounds: Normal breath sounds.  Abdominal:  General: Bowel sounds are normal.     Palpations: Abdomen is soft.  Skin:    General: Skin is warm and dry.     Capillary Refill: Capillary refill takes less than 2 seconds.  Neurological:     Mental Status: He is alert and oriented to person, place, and time.  Psychiatric:        Behavior: Behavior normal.      Musculoskeletal Exam: C-spine, thoracic spine, lumbar spine good range of motion.  No midline spinal tenderness.  No SI joint tenderness.  Shoulders, elbows, wrist joints, MCPs and PIPs and DIPs good range of motion no synovitis.  Hip joints, knee joints, ankle joints, MTPs, PIPs, DIPs good range of motion no synovitis.  No warmth or effusion bilateral knee joints.  No tenderness or swelling of ankle joints.  No tenderness over trochanteric bursa bilaterally.  CDAI Exam: CDAI Score: 0.2  Patient Global: 1 mm; Provider Global: 1 mm Swollen: 0 ; Tender: 0  Joint Exam   No joint exam has been documented for this visit   There is currently no information documented on the homunculus. Go to the Rheumatology activity and complete the homunculus joint exam.  Investigation: No  additional findings.  Imaging: No results found.  Recent Labs: Lab Results  Component Value Date   WBC 7.1 11/18/2018   HGB 13.2 11/18/2018   PLT 226 11/18/2018   NA 140 11/18/2018   K 3.8 11/18/2018   CL 105 11/18/2018   CO2 26 11/18/2018   GLUCOSE 88 11/18/2018   BUN 19 11/18/2018   CREATININE 1.33 (H) 11/18/2018   BILITOT 0.7 11/18/2018   ALKPHOS 91 07/21/2018   AST 18 11/18/2018   ALT 14 11/18/2018   PROT 6.1 11/18/2018   ALBUMIN 4.1 07/21/2018   CALCIUM 9.2 11/18/2018   GFRAA 61 11/18/2018   QFTBGOLDPLUS NEGATIVE 08/24/2018    Speciality Comments: No specialty comments available.  Procedures:  No procedures performed Allergies: Atorvastatin and Ezetimibe   Assessment / Plan:     Visit Diagnoses: Seronegative arthritis - 07/21/18: sed rate 72, CK 72, ANA negative, RF-: He has no synovitis on exam today.  He is currently on methotrexate 4 tablets by mouth once weekly and prednisone 8 mg by mouth daily.  He continues to reduce prednisone by 1 mg every month.  He has not noticed any increased joint pain or joint swelling since reducing the dose of prednisone.  He has no myalgias or muscle weakness at this time.  He has no joint tenderness or synovitis on exam today.  We will check CBC and CMP today.  He will continue on methotrexate 4 tablets by mouth once weekly and continue tapering prednisone as discussed.  He was advised to notify us if develops increased joint pain or joint swelling as he continues to further taper prednisone.  He will follow-up in the office in 3 months.   High risk medication use - MTX 4 tablets po once weekly and folic acid 2 mg po daily.  CBC and CMP were drawn on 11/18/2018.  Creatinine was elevated at 1.33 and GFR was 53.  He was advised to stay on low-dose methotrexate.  We will check a CBC and CMP today.- Plan: CBC with Differential/Platelet, COMPLETE METABOLIC PANEL WITH GFR  Primary osteoarthritis of both hands - He has PIP and DIP synovial  thickening consistent with osteoarthritis. Complete fist formation bilaterally.  Joint protection and muscle strengthening were discussed.   Primary osteoarthritis of both knees -  He has good ROM with no discomfort.  No warmth or effusion of knee joints noted.   Primary osteoarthritis of both feet - He has no feet pain or discomfort at this time.  He wears proper fitting shoes.   Spinal stenosis of lumbar region, unspecified whether neurogenic claudication present: He has no lower back pain at this time.  He has no symptoms of radiculopathy.   Other medical conditions are listed as follows:   Essential hypertension   Left bundle branch block (LBBB)   History of hypothyroidism   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   Family history of psoriasis   History of anxiety   Orders: Orders Placed This Encounter  Procedures   CBC with Differential/Platelet   COMPLETE METABOLIC PANEL WITH GFR   Meds ordered this encounter  Medications   predniSONE (DELTASONE) 5 MG tablet    Sig: Take 1 tablet (5 mg total) by mouth daily with breakfast.    Dispense:  30 tablet    Refill:  0   predniSONE (DELTASONE) 1 MG tablet    Sig: Take 3 tablets (3 mg total) by mouth daily with breakfast. Take with 5 mg Tab, follow prednisone taper as scheduled.    Dispense:  90 tablet    Refill:  0     Follow-Up Instructions: Return in 3 months (on 04/07/2019) for Rheumatoid arthritis, Osteoarthritis.   Ofilia Neas, PA-C  Note - This record has been created using Dragon software.  Chart creation errors have been sought, but may not always  have been located. Such creation errors do not reflect on  the standard of medical care.

## 2018-12-24 ENCOUNTER — Other Ambulatory Visit: Payer: Self-pay | Admitting: Rheumatology

## 2018-12-24 NOTE — Telephone Encounter (Addendum)
Last Visit:09/25/2018 telemedicine Next Visit: 01/05/19  Patient states is currently on 8 mg of Prednisone and will taper to 7 mg on 01/13/19.  Patient does not need a refill at this time. Will contact office when a refill is needed.

## 2018-12-30 ENCOUNTER — Other Ambulatory Visit: Payer: Self-pay | Admitting: Cardiology

## 2019-01-05 ENCOUNTER — Other Ambulatory Visit: Payer: Self-pay

## 2019-01-05 ENCOUNTER — Ambulatory Visit (INDEPENDENT_AMBULATORY_CARE_PROVIDER_SITE_OTHER): Payer: Medicare Other | Admitting: Physician Assistant

## 2019-01-05 ENCOUNTER — Encounter: Payer: Self-pay | Admitting: Physician Assistant

## 2019-01-05 VITALS — BP 134/79 | HR 72 | Resp 15 | Ht 68.5 in | Wt 192.0 lb

## 2019-01-05 DIAGNOSIS — M48061 Spinal stenosis, lumbar region without neurogenic claudication: Secondary | ICD-10-CM

## 2019-01-05 DIAGNOSIS — Z79899 Other long term (current) drug therapy: Secondary | ICD-10-CM

## 2019-01-05 DIAGNOSIS — M170 Bilateral primary osteoarthritis of knee: Secondary | ICD-10-CM | POA: Diagnosis not present

## 2019-01-05 DIAGNOSIS — M1380 Other specified arthritis, unspecified site: Secondary | ICD-10-CM

## 2019-01-05 DIAGNOSIS — Z8659 Personal history of other mental and behavioral disorders: Secondary | ICD-10-CM

## 2019-01-05 DIAGNOSIS — I10 Essential (primary) hypertension: Secondary | ICD-10-CM

## 2019-01-05 DIAGNOSIS — Z840 Family history of diseases of the skin and subcutaneous tissue: Secondary | ICD-10-CM

## 2019-01-05 DIAGNOSIS — M19071 Primary osteoarthritis, right ankle and foot: Secondary | ICD-10-CM

## 2019-01-05 DIAGNOSIS — Z8639 Personal history of other endocrine, nutritional and metabolic disease: Secondary | ICD-10-CM

## 2019-01-05 DIAGNOSIS — M19041 Primary osteoarthritis, right hand: Secondary | ICD-10-CM | POA: Diagnosis not present

## 2019-01-05 DIAGNOSIS — I447 Left bundle-branch block, unspecified: Secondary | ICD-10-CM

## 2019-01-05 DIAGNOSIS — Z953 Presence of xenogenic heart valve: Secondary | ICD-10-CM

## 2019-01-05 DIAGNOSIS — M19042 Primary osteoarthritis, left hand: Secondary | ICD-10-CM

## 2019-01-05 DIAGNOSIS — M19072 Primary osteoarthritis, left ankle and foot: Secondary | ICD-10-CM

## 2019-01-05 MED ORDER — PREDNISONE 1 MG PO TABS: 3.0000 mg | ORAL_TABLET | Freq: Every day | ORAL | 0 refills | Status: DC

## 2019-01-05 MED ORDER — PREDNISONE 5 MG PO TABS: 5.0000 mg | ORAL_TABLET | Freq: Every day | ORAL | 0 refills | Status: DC

## 2019-01-06 LAB — CBC WITH DIFFERENTIAL/PLATELET
Absolute Monocytes: 502 cells/uL (ref 200–950)
Basophils Absolute: 9 cells/uL (ref 0–200)
Basophils Relative: 0.1 %
Eosinophils Absolute: 9 cells/uL — ABNORMAL LOW (ref 15–500)
Eosinophils Relative: 0.1 %
HCT: 41.1 % (ref 38.5–50.0)
Hemoglobin: 14 g/dL (ref 13.2–17.1)
Lymphs Abs: 825 cells/uL — ABNORMAL LOW (ref 850–3900)
MCH: 33.8 pg — ABNORMAL HIGH (ref 27.0–33.0)
MCHC: 34.1 g/dL (ref 32.0–36.0)
MCV: 99.3 fL (ref 80.0–100.0)
MPV: 10.3 fL (ref 7.5–12.5)
Monocytes Relative: 5.9 %
Neutro Abs: 7157 cells/uL (ref 1500–7800)
Neutrophils Relative %: 84.2 %
Platelets: 272 10*3/uL (ref 140–400)
RBC: 4.14 10*6/uL — ABNORMAL LOW (ref 4.20–5.80)
RDW: 14.5 % (ref 11.0–15.0)
Total Lymphocyte: 9.7 %
WBC: 8.5 10*3/uL (ref 3.8–10.8)

## 2019-01-06 LAB — COMPLETE METABOLIC PANEL WITHOUT GFR
AG Ratio: 2 (calc) (ref 1.0–2.5)
ALT: 21 U/L (ref 9–46)
AST: 24 U/L (ref 10–35)
Albumin: 4.7 g/dL (ref 3.6–5.1)
Alkaline phosphatase (APISO): 50 U/L (ref 35–144)
BUN: 20 mg/dL (ref 7–25)
CO2: 24 mmol/L (ref 20–32)
Calcium: 9.8 mg/dL (ref 8.6–10.3)
Chloride: 105 mmol/L (ref 98–110)
Creat: 1.17 mg/dL (ref 0.70–1.18)
GFR, Est African American: 72 mL/min/{1.73_m2}
GFR, Est Non African American: 62 mL/min/{1.73_m2}
Globulin: 2.4 g/dL (ref 1.9–3.7)
Glucose, Bld: 121 mg/dL — ABNORMAL HIGH (ref 65–99)
Potassium: 4.6 mmol/L (ref 3.5–5.3)
Sodium: 138 mmol/L (ref 135–146)
Total Bilirubin: 1.4 mg/dL — ABNORMAL HIGH (ref 0.2–1.2)
Total Protein: 7.1 g/dL (ref 6.1–8.1)

## 2019-01-06 NOTE — Progress Notes (Signed)
Labs are stable. Creatinine and GFR are WNL.

## 2019-01-17 ENCOUNTER — Other Ambulatory Visit: Payer: Self-pay | Admitting: Rheumatology

## 2019-01-18 NOTE — Telephone Encounter (Signed)
Last Visit: 01/05/19 Next Visit: 04/06/19 Labs: 01/05/19 Labs are stable. Creatinine and GFR are WNL.  Okay to refill per Dr. Estanislado Pandy

## 2019-01-29 ENCOUNTER — Other Ambulatory Visit: Payer: Self-pay | Admitting: Physician Assistant

## 2019-01-29 NOTE — Telephone Encounter (Signed)
Last Visit: 01/05/2019 Next Visit: 04/06/2019  Patient is tapering prednisone by 1mg  every month.   Okay to refill per Dr. Estanislado Pandy.

## 2019-02-12 ENCOUNTER — Ambulatory Visit (INDEPENDENT_AMBULATORY_CARE_PROVIDER_SITE_OTHER): Payer: Medicare Other

## 2019-02-12 ENCOUNTER — Other Ambulatory Visit: Payer: Self-pay

## 2019-02-12 DIAGNOSIS — Z23 Encounter for immunization: Secondary | ICD-10-CM

## 2019-02-16 ENCOUNTER — Other Ambulatory Visit: Payer: Self-pay

## 2019-02-16 ENCOUNTER — Ambulatory Visit (INDEPENDENT_AMBULATORY_CARE_PROVIDER_SITE_OTHER): Payer: Medicare Other

## 2019-02-16 ENCOUNTER — Encounter: Payer: Self-pay | Admitting: Family

## 2019-02-16 ENCOUNTER — Ambulatory Visit (INDEPENDENT_AMBULATORY_CARE_PROVIDER_SITE_OTHER): Payer: Medicare Other | Admitting: Family

## 2019-02-16 VITALS — BP 148/80 | HR 74 | Ht 68.5 in | Wt 193.0 lb

## 2019-02-16 DIAGNOSIS — I447 Left bundle-branch block, unspecified: Secondary | ICD-10-CM

## 2019-02-16 DIAGNOSIS — R42 Dizziness and giddiness: Secondary | ICD-10-CM

## 2019-02-16 DIAGNOSIS — H93293 Other abnormal auditory perceptions, bilateral: Secondary | ICD-10-CM | POA: Diagnosis not present

## 2019-02-16 DIAGNOSIS — I10 Essential (primary) hypertension: Secondary | ICD-10-CM | POA: Diagnosis not present

## 2019-02-16 DIAGNOSIS — Z953 Presence of xenogenic heart valve: Secondary | ICD-10-CM

## 2019-02-16 DIAGNOSIS — E785 Hyperlipidemia, unspecified: Secondary | ICD-10-CM

## 2019-02-16 DIAGNOSIS — I6523 Occlusion and stenosis of bilateral carotid arteries: Secondary | ICD-10-CM

## 2019-02-16 NOTE — Progress Notes (Signed)
Office Visit    Patient Name: Eric [name] Date of Encounter: 02/16/2019  Primary Care Provider:  Colon Branch, MD Primary Cardiologist:  Shirlee More, MD Electrophysiologist:  None   Chief Complaint    Eric [name] is a 73 y.o. male with a hx of AS s/p AVR, HTN, HLD, hypothyroidism presents today for follow up after episode of lightheadedness and near syncope.    Past Medical History    Past Medical History:  Diagnosis Date  . Abnormal LFTs   . Anxiety   . Aortic stenosis 04/15/2011  . Dizziness 04/21/2014  . Dyspnea   . Heart murmur   . HNP (herniated nucleus pulposus), lumbar    L5-S1  . Hyperglycemia 10/20/2014  . Hyperlipidemia   . Hypertension   . Hypothyroidism   . Left bundle branch block (LBBB) 12/18/2017  . Lumbar stenosis   . Macular edema    Dr  Sherlynn Stalls   . 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 AORTIC VALVE REPLACEMENT N/A 12/17/2017   Procedure: MINIMALLY INVASIVE AORTIC VALVE REPLACEMENT (AVR) [ using Edwards Intuity Valve Size 60mm;  Surgeon: Rexene Alberts, MD;  Location: [facility];  Service: Open Heart Surgery;  Laterality: N/A;  MINI THORACOTOMY  . CARDIAC CATHETERIZATION  11/10/00  . CATARACT EXTRACTION W/ INTRAOCULAR LENS IMPLANT     RIGHT EYE  . CHOLECYSTECTOMY    . EYE SURGERY     Right eye cataract  . INGUINAL HERNIA REPAIR     (B)  . LUMBAR LAMINECTOMY/DECOMPRESSION MICRODISCECTOMY N/A 04/23/2018   Procedure: Microlumbar decompression Lumbar five-Sacral one;  Surgeon: Susa Day, MD;  Location: [facility];  Service: Orthopedics;  Laterality: N/A;  . POLYPECTOMY    . RIGHT/LEFT HEART CATH AND CORONARY ANGIOGRAPHY N/A 10/16/2017   Procedure: RIGHT/LEFT HEART CATH AND CORONARY ANGIOGRAPHY;  Surgeon: [name], MD;   Location: [facility];  Service: Cardiovascular;  Laterality: N/A;  . TEE WITHOUT CARDIOVERSION N/A 12/17/2017   Procedure: TRANSESOPHAGEAL ECHOCARDIOGRAM (TEE);  Surgeon: Rexene Alberts, MD;  Location: [place] [facility];  Service: Open Heart Surgery;  Laterality: N/A;  . TONSILLECTOMY      Allergies  Allergies  Allergen Reactions  . Atorvastatin Other (See Comments)    REACTION: myalgia  . Ezetimibe Other (See Comments)    REACTION: myalgia    History of Present Illness    Eric [name] is a 73 y.o. male with a hx of AS s/p AVR, HTN, HLD, hypothyroidism, rheumatoid arthritis last seen by Dr. Bettina Gavia 10/05/18. He is w/p minimally invasive AVR with bioprosthesis 04/07/18 in setting of preoperative evaluation for lumbar decompression. He was readmitted 12/30/17-01/01/18 with wide-complex tachycardia and medication induced hypotension -he was noted to be in wide-complex tachycardia by EMS and cardioverted and converted to SR.  Of note he was seen in the ED 10/11/2018 after falling out of bed requiring sutures to his right eyebrow-no mention of syncope.  Very pleasant gentleman who enjoys spending time with family and has 6 grandchildren.  [place] today [place]  [place] and jumped [place] quickly when someone called to his wife was falling.  His wife has a bruised hip but no major injuries.  As he was quickly  navigating down [place] steps and "moving quick "he began to feel lightheaded and as if the "switch was cut off ".  He does not recall losing consciousness.  He was assisted to a lying position by his family members.  EMS was called and he brings a copy of his EKG today.  It shows a rate of 79 with known left bundle branch block.  He was told his blood sugars was fine when checked and does not take any blood sugar lowering medications.  Tells me he thinks he was well-hydrated that day.   Checks his blood  pressure regularly at home with systolics [id number] to [id number] after medications.  He will have an occasional systolic blood pressure as high as 170 prior to taking his medications.  His medications have been adjusted for hypotension.  Tells me when he makes quick positional changes he feels like his "head is full of cotton balls" .  Does endorse making position changes quickly.   Endorses hearing a noise like a "fan" in his bilateral ears.  Does endorse hearing his heartbeat in his ears prior to his valve surgery but states this is different.  We discussed following up with his PCP for consideration of referral to ENT.  He is presently following with a rheumatologist and recently started on methotrexate.  Tells me this is helping him remarkably.  His rheumatologist is working to slowly decrease his prednisone dose.  We discussed that this may improve his blood pressure and we will follow closely.    EKGs/Labs/Other Studies Reviewed:   The following studies were reviewed today:  Long Term Monitor 01/15/18 A long-term Holter monitor was performed from 01/15/2018 to 01/28/2018.   Indication, wide-complex tachycardia    Baseline rhythm sinus left bundle branch block minimum rate 63 bpm average rate 88 bpm maximum rate 126 bpm   Ventricular ectopy, 94 single PVCs 1 couplet no episodes of ventricular tachycardia   Supraventricular ectopy, 548 APCs 1 3 beat run of APCs but no episodes of atrial fibrillation or flutter   Conduction abnormality, no episodes of sinus node or AV block longest R to R interval 2.4 seconds   Symptoms none   Conclusion, left bundle branch block occasional APCs no sustained supraventricular or ventricular tachycardia noted  Echo 12/31/17 Study Conclusions   - Left ventricle: Abnormal septal motion Systolic function was   normal. The estimated ejection fraction was in the range of 50%  to 55%. - Aortic valve: Intuity Elite pericardial tissue valve. Normal   appearing  prosthetic AVR with no peri valvular regurgitation   Valve area (VTI): 1.4 cm^2. Valve area (Vmean): 1.55 cm^2. - Mitral valve: Calcified annulus. Mildly thickened leaflets . - Left atrium: The atrium was mildly dilated. - Right atrium: The atrium was mildly dilated. - Atrial septum: No defect or patent foramen ovale was identified. - Pulmonic valve: Peak gradient (S): 11 mm Hg.   EKG: EKG independently reviewed from 02/15/2019 by EMS shows sinus rhythm with rate 79 with LBBB.  Recent Labs: 11/17/2018: TSH 1.85 01/05/2019: ALT 21; BUN 20; Creat 1.17; Hemoglobin 14.0; Platelets 272; Potassium 4.6; Sodium 138  Recent Lipid Panel    Component Value Date/Time   CHOL 172 02/24/2018 1150   TRIG 135 02/24/2018 1150   TRIG 115 05/05/2006 0913   HDL 61 02/24/2018 1150   CHOLHDL 2.8 02/24/2018 1150   CHOLHDL 3 09/03/2017 1037   VLDL 29.8 09/03/2017 1037   LDLCALC 84 02/24/2018 1150   LDLDIRECT 76.5 04/07/2008 1057  Home Medications   Current Meds  Medication Sig  . amLODipine (NORVASC) 5 MG tablet Take 0.5 tablets (2.5 mg total) by mouth daily.  Marland Kitchen aspirin EC 81 MG tablet Take 1 tablet (81 mg total) by mouth daily. Resume 5 days postop  . carvedilol (COREG) 12.5 MG tablet TAKE 1 TABLET (12.5 MG TOTAL) BY MOUTH 2 (TWO) TIMES DAILY WITH A MEAL.  Marland Kitchen Cholecalciferol (VITAMIN D-3 PO) Take 2,000 Units by mouth daily.  . folic acid (FOLVITE) 1 MG tablet Take 2 tablets (2 mg total) by mouth daily.  Marland Kitchen levothyroxine (SYNTHROID) 88 MCG tablet Take 1 tablet (88 mcg total) by mouth daily before breakfast.  . methotrexate (RHEUMATREX) 2.5 MG tablet TAKE 4 TABLETS BY MOUTH ONE TIME PER WEEK  . predniSONE (DELTASONE) 1 MG tablet Take 2 tablets (2 mg total) by mouth daily with breakfast. Take with 5 mg Tab, follow prednisone taper as scheduled. (Patient taking differently: 1 mg. Take 1 tablet (1 mg total) by mouth daily with breakfast. Take with 5 mg Tab, follow prednisone taper as scheduled.)  . predniSONE  (DELTASONE) 5 MG tablet TAKE 1 TABLET BY MOUTH EVERY DAY WITH BREAKFAST  . rosuvastatin (CRESTOR) 5 MG tablet TAKE 1 TABLET BY MOUTH EVERY DAY  . spironolactone (ALDACTONE) 25 MG tablet TAKE 1 TABLET BY MOUTH EVERY DAY  . telmisartan (MICARDIS) 80 MG tablet TAKE 1/2 TABLET (40 MG TOTAL) BY MOUTH DAILY in A.M. AND 1/2 TABLET (40 MG TOTAL) BY MOUTH IN AFTERNOON ON Monday & Thursday also.     Review of Systems    Review of Systems  Constitution: Negative for chills, fever and malaise/fatigue.  Cardiovascular: Positive for near-syncope. Negative for chest pain, dyspnea on exertion, irregular heartbeat, leg swelling, palpitations and syncope.  Respiratory: Negative for cough, shortness of breath and wheezing.   Gastrointestinal: Negative for nausea and vomiting.  Neurological: Positive for light-headedness. Negative for dizziness and weakness.   All other systems reviewed and are otherwise negative except as noted above.  Physical Exam    VS:  BP (!) 148/80 (BP Location: Left Arm, Patient Position: Sitting, Cuff Size: Normal)   Pulse 74   Ht 5' 8.5" (1.74 m)   Wt 193 lb (87.5 kg)   SpO2 99%   BMI 28.92 kg/m  , BMI Body mass index is 28.92 kg/m. GEN: Well nourished, well developed, in no acute distress. HEENT: normal. Neck: Supple, no JVD, carotid bruits, or masses. Cardiac: RRR, no murmurs, rubs, or gallops. No clubbing, cyanosis, edema.  Radials/DP/PT 2+ and equal bilaterally.  Respiratory:  Respirations regular and unlabored, clear to auscultation bilaterally. GI: Soft, nontender, nondistended, BS + x 4. MS: No deformity or atrophy. Skin: Warm and dry, no rash. Neuro:  Strength and sensation are intact. Psych: Normal affect.  Assessment & Plan    1. Lightheadedness and near-syncope - Reports frequent episodes of lightheadedness and "cotton headed feeling" on position changes. Episode yesterday at [place] where he "jumped up quick" to help his wife, quickly walked down [place] stairs,  and felt lightheaded and was assisted to lay down by his family. Tells me he believes he was well hydrated. Does not take any hypoglycemic agents. Strong suspicion orthostatic hypotension, but concerned for heart block in the setting of known LBBB. Etiology dehydration versus orthostatic hypotension vs arrthymia (known LBBB) vs hypoglycemia. 14 day ZIO. Education provided regarding prevention of orthostatic hypotension.   2. LBBB - Known diagnosis. Presents on EKG 01/13/18 and on EMT EKG 02/15/19. 14 day  Zio to assess for heart block.   3. Abnormal auditory perception in both ears - Describes a "fan like noise" intermittently in bilateral ears most often noted with position changes. Low suspicion vascular origin as it is not pulsatile and carotid duplex 12/2017 with only 1-39% stenosis. Encouraged to keep symptom log and follow up with his PCP regarding possible referral to ENT.  4. Wide complex tachycardia - Noted history from 12/2017. Was reported by EMS and cardioverted to NSR.   5. HTN - Checks BP regularly at home with SBP typically 120s-140s. Noted history of medication induced hypotension. Continue present anti-hypertensive regimen. He requests 40mg  tablet of Telmisartan instead of the 80mg  he is presently splitting in half as they are difficult to split will send to his pharmacy.   6. AS s/p minimally invasive bioprosthetic AVR 12/17/17 - Stable. No murmur on exam. He is aware of SBE prophylaxis.   7. Bilateral carotid artery stenosis - Pre-op carotid duplex 12/2017 with bilateral 1-39% stenosis. Low suspicion this is the etiology of his lightheadedness. Low suspicion this is the origin of the "fan like noise" in his bilateral ears.  8. HLD - Most recent lipid profile 02/24/18. Continue Crestor. Will require updated lipid profile at follow up or next appt with his PCP.    Disposition: Follow up in 6 week(s) with Dr. Bettina Gavia to discuss ZIO and for routine 6 month follow up.   Loel Dubonnet, NP  02/16/2019, 3:19 PM

## 2019-02-16 NOTE — Patient Instructions (Addendum)
Medication Instructions:  No medication changes today.   If you need a refill on your cardiac medications before your next appointment, please call your pharmacy.   Lab work: No lab work today.   If you have labs (blood work) drawn today and your tests are completely normal, you will receive your results only by: Marland Kitchen MyChart Message (if you have MyChart) OR . A paper copy in the mail If you have any lab test that is abnormal or we need to change your treatment, we will call you to review the results.  Testing/Procedures: You had a Zio monitor placed today. Please place it back in the mail in 2 weeks and we will call you with results approximately 1-2 weeks after your return it.   Follow-Up: At [facility], you and your health needs are our priority.  As part of our continuing mission to provide you with exceptional heart care, we have created designated Provider Care Teams.  These Care Teams include your primary Cardiologist (physician) and Advanced Practice Providers (APPs -  Physician Assistants and Nurse Practitioners) who all work together to provide you with the care you need, when you need it. You will need a follow up appointment in 1.5 months. You may see Shirlee More, MD or another member of our [facility] Provider Team in [place]: Jenne Campus, MD . Jyl Heinz, MD . Berniece Salines, MD . Laurann Montana, NP  Any Other Special Instructions Will Be Listed Below (If Applicable).     Hypotension As your heart beats, it forces blood through your body. This force is called blood pressure. If you have hypotension, you have low blood pressure. When your blood pressure is too low, you may not get enough blood to your brain or other parts of your body. This may cause you to feel weak, light-headed, have a fast heartbeat, or even pass out (faint). Low blood pressure may be harmless, or it may cause serious problems. What are the causes?  Blood loss.  Not enough water in  the body (dehydration).  Heart problems.  Hormone problems.  Pregnancy.  A very bad infection.  Not having enough of certain nutrients.  Very bad allergic reactions.  Certain medicines. What increases the risk?  Age. The risk increases as you get older.  Conditions that affect the heart or the brain and spinal cord (central nervous system).  Taking certain medicines.  Being pregnant. What are the signs or symptoms?  Feeling: ? Weak. ? Light-headed. ? Dizzy. ? Tired (fatigued).  Blurred vision.  Fast heartbeat.  Passing out, in very bad cases. How is this treated?  Changing your diet. This may involve eating more salt (sodium) or drinking more water.  Taking medicines to raise your blood pressure.  Changing how much you take (the dosage) of some of your medicines.  Wearing compression stockings. These stockings help to prevent blood clots and reduce swelling in your legs. In some cases, you may need to go to the hospital for:  Fluid replacement. This means you will receive fluids through an IV tube.  Blood replacement. This means you will receive donated blood through an IV tube (transfusion).  Treating an infection or heart problems, if this applies.  Monitoring. You may need to be monitored while medicines that you are taking wear off. Follow these instructions at home: Eating and drinking   Drink enough fluids to keep your pee (urine) pale yellow.  Eat a healthy diet. Follow instructions from your doctor about what  you can eat or drink. A healthy diet includes: ? Fresh fruits and vegetables. ? Whole grains. ? Low-fat (lean) meats. ? Low-fat dairy products.  Eat extra salt only as told. Do not add extra salt to your diet unless your doctor tells you to.  Eat small meals often.  Avoid standing up quickly after you eat. Medicines  Take over-the-counter and prescription medicines only as told by your doctor. ? Follow instructions from your  doctor about changing how much you take of your medicines, if this applies. ? Do not stop or change any of your medicines on your own. General instructions   Wear compression stockings as told by your doctor.  Get up slowly from lying down or sitting.  Avoid hot showers and a lot of heat as told by your doctor.  Return to your normal activities as told by your doctor. Ask what activities are safe for you.  Do not use any products that contain nicotine or tobacco, such as cigarettes, e-cigarettes, and chewing tobacco. If you need help quitting, ask your doctor.  Keep all follow-up visits as told by your doctor. This is important. Contact a doctor if:  You throw up (vomit).  You have watery poop (diarrhea).  You have a fever for more than 2-3 days.  You feel more thirsty than normal.  You feel weak and tired. Get help right away if:  You have chest pain.  You have a fast or uneven heartbeat.  You lose feeling (have numbness) in any part of your body.  You cannot move your arms or your legs.  You have trouble talking.  You get sweaty or feel light-headed.  You pass out.  You have trouble breathing.  You have trouble staying awake.  You feel mixed up (confused). Summary  Hypotension is also called low blood pressure. It is when the force of blood pumping through your arteries is too weak.  Hypotension may be harmless, or it may cause serious problems.  Treatment may include changing your diet and medicines, and wearing compression stockings.  In very bad cases, you may need to go to the hospital. This information is not intended to replace advice given to you by your health care provider. Make sure you discuss any questions you have with your health care provider. Document Released: 08/21/2009 Document Revised: 11/20/2017 Document Reviewed: 11/20/2017 Elsevier Patient Education  2020 [employer].

## 2019-02-17 MED ORDER — TELMISARTAN 40 MG PO TABS: ORAL_TABLET | ORAL | 1 refills | Status: DC

## 2019-02-17 NOTE — Addendum Note (Signed)
Addended by: Loel Dubonnet on: 02/17/2019 10:07 AM   Modules accepted: Orders

## 2019-02-22 ENCOUNTER — Other Ambulatory Visit: Payer: Self-pay | Admitting: Rheumatology

## 2019-02-22 ENCOUNTER — Other Ambulatory Visit: Payer: Self-pay

## 2019-02-22 DIAGNOSIS — Z20822 Contact with and (suspected) exposure to covid-19: Secondary | ICD-10-CM

## 2019-02-22 MED ORDER — PREDNISONE 5 MG PO TABS: 5.0000 mg | ORAL_TABLET | Freq: Every day | ORAL | 0 refills | Status: DC

## 2019-02-22 NOTE — Telephone Encounter (Signed)
Last Visit: 01/05/2019 Next Visit: 04/06/2019  Patient is currently on 6 mg of Prednisone and due to start 5 mg on 03/11/19  Okay to refill per Dr. Estanislado Pandy

## 2019-02-23 LAB — NOVEL CORONAVIRUS, NAA: SARS-CoV-2, NAA: NOT DETECTED

## 2019-03-10 ENCOUNTER — Other Ambulatory Visit: Payer: Self-pay

## 2019-03-11 ENCOUNTER — Encounter: Payer: Self-pay | Admitting: Internal Medicine

## 2019-03-11 ENCOUNTER — Ambulatory Visit: Payer: Medicare Other | Admitting: Internal Medicine

## 2019-03-11 VITALS — BP 136/65 | HR 64 | Temp 96.8°F | Resp 16 | Ht 69.0 in | Wt 193.2 lb

## 2019-03-11 DIAGNOSIS — M069 Rheumatoid arthritis, unspecified: Secondary | ICD-10-CM | POA: Diagnosis not present

## 2019-03-11 DIAGNOSIS — E785 Hyperlipidemia, unspecified: Secondary | ICD-10-CM

## 2019-03-11 DIAGNOSIS — I10 Essential (primary) hypertension: Secondary | ICD-10-CM

## 2019-03-11 LAB — LIPID PANEL
Cholesterol: 179 mg/dL (ref 0–200)
HDL: 54 mg/dL (ref >39.00)
LDL Cholesterol: 86 mg/dL (ref 0–99)
NonHDL: 124.76
Total CHOL/HDL Ratio: 3
Triglycerides: 196 mg/dL — ABNORMAL HIGH (ref 0.0–149.0)
VLDL: 39.2 mg/dL (ref 0.0–40.0)

## 2019-03-11 LAB — BASIC METABOLIC PANEL
BUN: 15 mg/dL (ref 6–23)
CO2: 29 mEq/L (ref 19–32)
Calcium: 9.6 mg/dL (ref 8.4–10.5)
Chloride: 103 mEq/L (ref 96–112)
Creatinine, Ser: 1.15 mg/dL (ref 0.40–1.50)
GFR: 62.31 mL/min (ref >60.00)
Glucose, Bld: 99 mg/dL (ref 70–99)
Potassium: 4.1 mEq/L (ref 3.5–5.1)
Sodium: 140 mEq/L (ref 135–145)

## 2019-03-11 NOTE — Progress Notes (Signed)
Pre visit review using our clinic review tool, if applicable. No additional management support is needed unless otherwise documented below in the visit note. 

## 2019-03-11 NOTE — Progress Notes (Signed)
Subjective:    Patient ID: Eric [name], male    DOB: Jun 24, 1945, 73 y.o.   MRN: [id number]  DOS:  03/11/2019 Type of visit - description:  Routine office visit In general feeling well. Good compliance with medications Rheumatoid arthritis: Decreasing slowly the dose of prednisone. Hypertension: Ambulatory BPs reviewed. Saw cardiology 02/16/2019, he still reports mild lightheadedness 1 hour after he takes his medications, triggered by moving or bending. They ordered 14-day ZIO, results pending   Review of Systems  Denies fever chills No chest pain no difficulty breathing No edema palpitations  Past Medical History:  Diagnosis Date  . Abnormal LFTs   . Anxiety   . Aortic stenosis 04/15/2011  . Dizziness 04/21/2014  . Dyspnea   . Heart murmur   . HNP (herniated nucleus pulposus), lumbar    L5-S1  . Hyperglycemia 10/20/2014  . Hyperlipidemia   . Hypertension   . Hypothyroidism   . Left bundle branch block (LBBB) 12/18/2017  . Lumbar stenosis   . Macular edema    Dr  Sherlynn Stalls   . 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 AORTIC VALVE REPLACEMENT N/A 12/17/2017   Procedure: MINIMALLY INVASIVE AORTIC VALVE REPLACEMENT (AVR) [ using Edwards Intuity Valve Size 17mm;  Surgeon: Rexene Alberts, MD;  Location: [place];  Service: Open Heart Surgery;  Laterality: N/A;  MINI THORACOTOMY  . CARDIAC CATHETERIZATION  11/10/00  . CATARACT EXTRACTION W/ INTRAOCULAR LENS IMPLANT     RIGHT EYE  . CHOLECYSTECTOMY    . EYE SURGERY     Right eye cataract  . INGUINAL HERNIA REPAIR     (B)  . LUMBAR LAMINECTOMY/DECOMPRESSION MICRODISCECTOMY N/A 04/23/2018   Procedure: Microlumbar decompression Lumbar five-Sacral one;  Surgeon: Susa Day, MD;  Location: [place];  Service:  Orthopedics;  Laterality: N/A;  . POLYPECTOMY    . RIGHT/LEFT HEART CATH AND CORONARY ANGIOGRAPHY N/A 10/16/2017   Procedure: RIGHT/LEFT HEART CATH AND CORONARY ANGIOGRAPHY;  Surgeon: [name], MD;  Location: [place] [facility];  Service: Cardiovascular;  Laterality: N/A;  . TEE WITHOUT CARDIOVERSION N/A 12/17/2017   Procedure: TRANSESOPHAGEAL ECHOCARDIOGRAM (TEE);  Surgeon: Rexene Alberts, MD;  Location: [place];  Service: Open Heart Surgery;  Laterality: N/A;  . TONSILLECTOMY      Social History   Socioeconomic History  . Marital status: Married    Spouse name: Not on file  . Number of children: 3  . Years of education: Not on file  . Highest education level: Not on file  Occupational History  . Occupation: retired- used to work for [employer]: 05/2008  Social Needs  . Financial resource strain: Not on file  . Food insecurity    Worry: Not on file    Inability: Not on file  . Transportation needs    Medical: Not on file    Non-medical: Not on file  Tobacco Use  . Smoking status: Former Smoker    Quit date: 04/15/1990    Years since quitting: 28.9  . Smokeless tobacco: Never Used  . Tobacco comment: used to smoke 2 ppd  Substance and Sexual Activity  . Alcohol use: Not Currently    Comment: no ETOH since the 90s  . Drug use: No  . Sexual activity: Not on file  Lifestyle  . Physical activity    Days per week: Not on file    Minutes per session: Not on file  . Stress: Not on file  Relationships  . Social Herbalist on phone: Not on file    Gets together: Not on file    Attends religious service: Not on file    Active member of club or organization: Not on file    Attends meetings of clubs or organizations: Not on file    Relationship status: Not on file  . Intimate partner violence    Fear of current or ex partner: Not on file    Emotionally abused: Not on file    Physically abused: Not on file    Forced sexual activity: Not on file  Other  Topics Concern  . Not on file  Social History Narrative   Lives w/ wife   3 children, 8 g-kids                Allergies as of 03/11/2019      Reactions   Atorvastatin Other (See Comments)   REACTION: myalgia   Ezetimibe Other (See Comments)   REACTION: myalgia      Medication List       Accurate as of March 11, 2019  9:59 AM. If you have any questions, ask your nurse or doctor.        amLODipine 5 MG tablet Commonly known as: NORVASC Take 0.5 tablets (2.5 mg total) by mouth daily.   aspirin EC 81 MG tablet Take 1 tablet (81 mg total) by mouth daily. Resume 5 days postop   carvedilol 12.5 MG tablet Commonly known as: COREG TAKE 1 TABLET (12.5 MG TOTAL) BY MOUTH 2 (TWO) TIMES DAILY WITH A MEAL.   folic acid 1 MG tablet Commonly known as: FOLVITE Take 2 tablets (2 mg total) by mouth daily.   levothyroxine 88 MCG tablet Commonly known as: SYNTHROID Take 1 tablet (88 mcg total) by mouth daily before breakfast.   methotrexate 2.5 MG tablet Commonly known as: RHEUMATREX TAKE 4 TABLETS BY MOUTH ONE TIME PER WEEK   predniSONE 1 MG tablet Commonly known as: DELTASONE Take 2 tablets (2 mg total) by mouth daily with breakfast. Take with 5 mg Tab, follow prednisone taper as scheduled.   predniSONE 5 MG tablet Commonly known as: DELTASONE Take 1 tablet (5 mg total) by mouth daily with breakfast.   rosuvastatin 5 MG tablet Commonly known as: CRESTOR TAKE 1 TABLET BY MOUTH EVERY DAY   spironolactone 25 MG tablet Commonly known as: ALDACTONE TAKE 1 TABLET BY MOUTH EVERY DAY   telmisartan 40 MG tablet Commonly known as: MICARDIS TAKE ONE TABLET (40 MG TOTAL) BY MOUTH DAILY in A.M. AND ONE TABLET (40 MG TOTAL) BY MOUTH IN AFTERNOON ON Monday & Thursday also.   VITAMIN D-3 PO Take 2,000 Units by mouth daily.           Objective:   Physical Exam BP 136/65 (BP Location: Left Arm, Patient Position: Sitting, Cuff Size: Small)   Pulse 64   Temp (!) 96.8 F (36  C) (Temporal)   Resp 16   Ht 5\' 9"  (1.753 m)   Wt 193 lb 4 oz (87.7 kg)   SpO2 99%   BMI 28.54 kg/m  General:   Well developed, NAD, BMI noted. HEENT:  Normocephalic . Face symmetric, atraumatic Lungs:  CTA B Normal respiratory effort, no intercostal retractions, no accessory muscle use. Heart:  RRR,  no murmur.  No pretibial edema bilaterally  Skin: Not pale. Not jaundice Neurologic:  alert & oriented X3.  Speech normal, gait appropriate for age and unassisted Psych--  Cognition and judgment appear intact.  Cooperative with normal attention span and concentration.  Behavior appropriate. No anxious or depressed appearing.      Assessment    Assessment HTN Hyperlipidemia Hypothyroidism R.A. dx 2020 Increase LFTs Ultrasound 2002 done for increased LFTs: Normal liver; Hep C negative 02-16-15 Aortic stenosis , dx  2012 , s/p AoVR 12/2017 Macular degeneration, cataracts  +FH CAD  --Pt's Brother has CAD dx age 67s --Patient himself has a cardiac catheterization with minimal to no disease 10/2017 - PLAN HTN Current regimen: -Amlodipine 5 mg   half tablet daily -Spironolactone 25 mg daily -Micardis  80 mg: 1/2 tablet daily.  On Monday on Thursday   takes an extra half tablet in the afternoon -Carvedilol 12.5 mg twice a day Ambulatory BPs in the majority in the low 140s (142) and 130s, very close to ideal of less than 135. For now recommend no change, will see cardiology soon, they may choose to change regimen.  Check a BMP. High cholesterol: On Crestor, check a FLP. Rheumatoid arthritis: Currently trying to decrease prednisone dose. Preventive care: Had a flu shot RTC CPX 4 months

## 2019-03-11 NOTE — Patient Instructions (Addendum)
Please schedule Medicare Wellness with Glenard Haring.   GO TO THE LAB : Get the blood work     GO TO THE FRONT DESK Schedule your next appointment   for a physical exam in 4 months  Continue checking your blood pressure BP GOAL is between 110/65 and  140/85. If it is consistently higher or lower, let me know

## 2019-03-12 NOTE — Assessment & Plan Note (Signed)
HTN Current regimen: -Amlodipine 5 mg   half tablet daily -Spironolactone 25 mg daily -Micardis  80 mg: 1/2 tablet daily.  On Monday on Thursday   takes an extra half tablet in the afternoon -Carvedilol 12.5 mg twice a day Ambulatory BPs in the majority in the low 140s (142) and 130s, very close to ideal of less than 135. For now recommend no change, will see cardiology soon, they may choose to change regimen.  Check a BMP. High cholesterol: On Crestor, check a FLP. Rheumatoid arthritis: Currently trying to decrease prednisone dose. Preventive care: Had a flu shot RTC CPX 4 months

## 2019-03-23 NOTE — Progress Notes (Signed)
Office Visit Note  Patient: Eric [name]             Date of Birth: 25-Jan-1946           MRN: [id number]             PCP: Colon Branch, MD Referring: Colon Branch, MD Visit Date: 04/06/2019 Occupation: @GUAROCC @  Subjective:  Stiffness in hands.   History of Present Illness: Eric [name] is a 73 y.o. male with history of seronegative rheumatoid arthritis and osteoarthritis. He is on Methotrexate 4 tables by mouth once weekly, folic acid 2 mg po daily, and prednisone 4 mg po daily.  He denies any joint pain or joint swelling.  He is aware that he will be tapering prednisone by 1 mg every month.  He has been tolerating methotrexate well.  Activities of Daily Living:  Patient reports morning stiffness for 0 minutes.   Patient Denies nocturnal pain.  Difficulty dressing/grooming: Denies Difficulty climbing stairs: Denies Difficulty getting out of chair: Denies Difficulty using hands for taps, buttons, cutlery, and/or writing: Denies  Review of Systems  Constitutional: Negative for fatigue and night sweats.  HENT: Negative for mouth sores, mouth dryness and nose dryness.   Eyes: Negative for redness, itching and dryness.  Respiratory: Negative for cough, hemoptysis, shortness of breath, wheezing and difficulty breathing.   Cardiovascular: Negative for chest pain, palpitations, hypertension, irregular heartbeat and swelling in legs/feet.  Gastrointestinal: Negative for blood in stool, constipation and diarrhea.  Endocrine: Negative for increased urination.  Genitourinary: Negative for difficulty urinating and painful urination.  Musculoskeletal: Negative for arthralgias, joint pain, joint swelling, myalgias, muscle weakness, morning stiffness, muscle tenderness and myalgias.  Skin: Negative for color change, rash, hair loss, nodules/bumps, skin tightness, ulcers and sensitivity to sunlight.  Allergic/Immunologic: Negative for susceptible to infections.  Neurological: Negative for  dizziness, fainting, numbness, headaches, memory loss, night sweats and weakness.  Hematological: Positive for bruising/bleeding tendency. Negative for swollen glands.  Psychiatric/Behavioral: Negative for depressed mood, confusion and sleep disturbance. The patient is not nervous/anxious.     PMFS History:  Patient Active Problem List   Diagnosis Date Noted  . Rheumatoid arthritis ([place]) 11/17/2018  . Spinal stenosis of lumbar region 04/23/2018  . HNP (herniated nucleus pulposus), lumbar 04/23/2018  . Prolapsed lumbar disc 03/03/2018  . Wide-complex tachycardia ([place]) 12/30/2017  . Left bundle branch block (LBBB) 12/18/2017  . 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 Abnormal LFTs   . Macular edema   . PCP NOTES >>>>>> 04/27/2015  . Hyperglycemia 10/20/2014  . Dizziness 04/21/2014  . Annual physical exam 04/15/2011  . Aortic stenosis 04/15/2011  . Hypothyroidism 10/13/2007  . Hyperlipidemia 10/13/2007  . Hypertension 04/08/2007    Past Medical History:  Diagnosis Date  . Abnormal LFTs   . Anxiety   . Aortic stenosis 04/15/2011  . Dizziness 04/21/2014  . Dyspnea   . Heart murmur   . HNP (herniated nucleus pulposus), lumbar    L5-S1  . Hyperglycemia 10/20/2014  . Hyperlipidemia   . Hypertension   . Hypothyroidism   . Left bundle branch block (LBBB) 12/18/2017  . Lumbar stenosis   . Macular edema    Dr  Sherlynn Stalls   . 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  Diabetes Mother   . Stroke Mother   . Diabetes Brother        ?  . Coronary artery disease Brother        Male 1st degree relative >50, had CABG in his 28s  . Emphysema Father   . COPD Father   . Diabetes Daughter   . Colon cancer Neg Hx   . Prostate  cancer Neg Hx   . Colon polyps Neg Hx   . Esophageal cancer Neg Hx   . Rectal cancer Neg Hx   . Stomach cancer Neg Hx    Past Surgical History:  Procedure Laterality Date  . AORTIC VALVE REPLACEMENT N/A 12/17/2017   Procedure: MINIMALLY INVASIVE AORTIC VALVE REPLACEMENT (AVR) [ using Edwards Intuity Valve Size 64mm;  Surgeon: Rexene Alberts, MD;  Location: [place];  Service: Open Heart Surgery;  Laterality: N/A;  MINI THORACOTOMY  . CARDIAC CATHETERIZATION  11/10/00  . CATARACT EXTRACTION W/ INTRAOCULAR LENS IMPLANT     RIGHT EYE  . CHOLECYSTECTOMY    . EYE SURGERY     Right eye cataract  . INGUINAL HERNIA REPAIR     (B)  . LUMBAR LAMINECTOMY/DECOMPRESSION MICRODISCECTOMY N/A 04/23/2018   Procedure: Microlumbar decompression Lumbar five-Sacral one;  Surgeon: Susa Day, MD;  Location: [place];  Service: Orthopedics;  Laterality: N/A;  . POLYPECTOMY    . RIGHT/LEFT HEART CATH AND CORONARY ANGIOGRAPHY N/A 10/16/2017   Procedure: RIGHT/LEFT HEART CATH AND CORONARY ANGIOGRAPHY;  Surgeon: [name], MD;  Location: [place] [facility];  Service: Cardiovascular;  Laterality: N/A;  . TEE WITHOUT CARDIOVERSION N/A 12/17/2017   Procedure: TRANSESOPHAGEAL ECHOCARDIOGRAM (TEE);  Surgeon: Rexene Alberts, MD;  Location: [place];  Service: Open Heart Surgery;  Laterality: N/A;  . TONSILLECTOMY     Social History   Social History Narrative   Lives w/ wife   3 children, 8 g-kids             Immunization History  Administered Date(s) Administered  . Fluad Quad(high Dose 65+) 02/12/2019  . Influenza Split 03/11/2013, 02/17/2014  . Influenza Whole 04/13/2007, 03/15/2008, 03/10/2012  . Influenza, High Dose Seasonal PF 04/04/2015, 03/28/2016, 03/18/2017, 03/16/2018  . Pneumococcal Conjugate-13 04/21/2014  . Pneumococcal Polysaccharide-23 04/15/2012  . Td 06/10/1996, 06/10/2000  . Tdap 04/15/2011, 10/11/2018  . Zoster 03/29/2015     Objective: Vital Signs: BP 132/79 (BP Location: Left  Arm, Patient Position: Sitting, Cuff Size: Normal)   Pulse 65   Resp 13   Ht 5' 8.5" (1.74 m)   Wt 197 lb 3.2 oz (89.4 kg)   BMI 29.55 kg/m    Physical Exam Vitals signs and nursing note reviewed.  Constitutional:      Appearance: He is well-developed.  HENT:     Head: Normocephalic and atraumatic.  Eyes:     Conjunctiva/sclera: Conjunctivae normal.     Pupils: Pupils are equal, round, and reactive to light.  Neck:     Musculoskeletal: Normal range of motion and neck supple.  Cardiovascular:     Rate and Rhythm: Normal rate and regular rhythm.     Heart sounds: Normal heart sounds.  Pulmonary:     Effort: Pulmonary effort is normal.     Breath sounds: Normal breath sounds.  Abdominal:     General: Bowel sounds are normal.     Palpations: Abdomen is soft.  Skin:    General: Skin is warm and dry.     Capillary Refill: Capillary refill takes less  than 2 seconds.  Neurological:     Mental Status: He is alert and oriented to person, place, and time.  Psychiatric:        Behavior: Behavior normal.      Musculoskeletal Exam: C-spine is good range of motion.  He has limited range of motion of lumbar spine with discomfort.  Shoulder joints, elbow joints with good range of motion.  He had no wrist joint, or MCP synovitis.  He has DIP and PIP thickening with no synovitis.  Hip joints, knee joints with good range of motion.  He had no tenderness across MTPs.  CDAI Exam: CDAI Score: 0.2  Patient Global: 1 mm; Provider Global: 1 mm Swollen: 0 ; Tender: 0  Joint Exam   No joint exam has been documented for this visit   There is currently no information documented on the homunculus. Go to the Rheumatology activity and complete the homunculus joint exam.  Investigation: No additional findings.  Imaging: No results found.  Recent Labs: Lab Results  Component Value Date   WBC 8.5 01/05/2019   HGB 14.0 01/05/2019   PLT 272 01/05/2019   NA 140 03/11/2019   K 4.1 03/11/2019    CL 103 03/11/2019   CO2 29 03/11/2019   GLUCOSE 99 03/11/2019   BUN 15 03/11/2019   CREATININE 1.15 03/11/2019   BILITOT 1.4 (H) 01/05/2019   ALKPHOS 91 07/21/2018   AST 24 01/05/2019   ALT 21 01/05/2019   PROT 7.1 01/05/2019   ALBUMIN 4.1 07/21/2018   CALCIUM 9.6 03/11/2019   GFRAA 72 01/05/2019   QFTBGOLDPLUS NEGATIVE 08/24/2018    Speciality Comments: No specialty comments available.  Procedures:  No procedures performed Allergies: Atorvastatin and Ezetimibe   Assessment / Plan:     Visit Diagnoses: Seronegative rheumatoid arthritis -he is doing well on methotrexate.  He has no synovitis on examination.  07/21/18: sed rate 72, CK 72, ANA negative, RF-.  High risk medication use - MTX 4 tablets po once weekly and folic acid 2 mg po daily, prednisone 4 mg p.o. daily.  - Plan: CBC with Differential/Platelet, COMPLETE METABOLIC PANEL WITH GFR today then every 3 months.  Primary osteoarthritis of both hands-joint protection was discussed.  Primary osteoarthritis of both knees-he is currently not having much knee joint discomfort  Primary osteoarthritis of both feet-minimal discomfort.  Spinal stenosis of lumbar region, unspecified whether neurogenic claudication present-he has off-and-on discomfort in his lower back.  History of anxiety  Left bundle branch block (LBBB)  Essential hypertension  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  History of hypothyroidism  Family history of psoriasis  On prednisone therapy -based on his age and also use of prednisone I will schedule DXA.  Use of calcium and vitamin D was emphasized.  Resistive exercises were discussed.  Plan: DG BONE DENSITY (DXA)  Orders: Orders Placed This Encounter  Procedures  . DG BONE DENSITY (DXA)  . CBC with Differential/Platelet  . COMPLETE METABOLIC PANEL WITH GFR   No orders of the defined types were placed in this encounter.    Follow-Up Instructions: Return  in 3 months (on 07/07/2019) for Rheumatoid arthritis, Osteoarthritis.   [name], MD  Note - This record has been created using Editor, commissioning.  Chart creation errors have been sought, but may not always  have been located. Such creation errors do not reflect on  the standard of medical care.

## 2019-03-26 NOTE — Progress Notes (Signed)
Cardiology Office Note:    Date:  03/29/2019   ID:  Eric [name], DOB [birth year], MRN [id number]  PCP:  Colon Branch, MD  Cardiologist:  Shirlee More, MD    Referring MD: Colon Branch, MD    ASSESSMENT:    1. Lightheadedness   2. 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 Essential hypertension   4. Hyperlipidemia, unspecified hyperlipidemia type    PLAN:    In order of problems listed above:  1. Mild symptoms likely due to quick-acting carvedilol superimposed on other antihypertensive agents, he will continue to monitor and split the carvedilol more than 90 minutes from his other antihypertensives to avoid lightheadedness. 2. Stable he is made a good recovery from tissue aortic valve surgery 3. Stable BP my office 120/60 sitting and standing I continue his current regimen and I told him to start checking midday and evening sitting and standing.  I think the high numbers at home early morning prior to medications 4. Continue statin lipids are ideal   Next appointment: 6 months   Medication Adjustments/Labs and Tests Ordered: Current medicines are reviewed at length with the patient today.  Concerns regarding medicines are outlined above.  No orders of the defined types were placed in this encounter.  No orders of the defined types were placed in this encounter.   Chief Complaint  Patient presents with   Follow-up    for    Near Syncope    after Zio monitor    History of Present Illness:    Eric [name] is a 73 y.o. male with a hx of AS s/p AVR, HTN, HLD, hypothyroidism  last seen 01/16/2019 with lightheadedness and near syncope Compliance with diet, lifestyle and medications: Yes  He continues to have episodes he describes as brief lightheadedness that occurs in the morning soon after he takes his meds when he shifts posture.  At times he gets systolics as low as [id number] either sitting or standing cannot document blood  pressures less than 100.  I think the problem is the quick acting carvedilol superimposed on his other antihypertensive agents I have asked him to split it out 90 minutes.  He will continue to follow his home blood pressures twice daily sitting and standing today my office is 120/60 in both postures.  I would not intensify his antihypertensive treatment.  His arthritis continues to be bothersome on methotrexate and prednisone he has had no edema shortness of breath chest pain or syncope.  Zio : Study Highlights An extended ZIO monitor was performed for 13 days and 17 hours beginning 02/16/2019 to evaluate near syncope.  Baseline rhythm is sinus bundle branch block is seen minimum average and maximum heart rates are 44 ,65 and 108 bpm.  There were no pauses of 3 seconds or greater and no episodes of high degree AV block or sinus node exit block.  Ventricular ectopy was rare with isolated PVCs  Supraventricular ectopy was rare with APCs there were 2 brief runs of atrial tachycardia the longest 13 complexes at 128 bpm.  There were no episodes of atrial fibrillation or flutter.  There were 5 triggered and 3 diary events.  1 of the triggered events showed infrequent PVCs all the other triggered and diary events showed sinus rhythm and no arrhythmia.   Conclusion rare ventricular and supraventricular ectopy without bradycardia.  Triggered and symptomatic events are predominantly unassociated with arrhythmia.  2 brief runs of atrial tachycardia asymptomatic.  Past Medical History:  Diagnosis Date   Abnormal LFTs    Anxiety    Aortic stenosis 04/15/2011   Dizziness 04/21/2014   Dyspnea    Heart murmur    HNP (herniated nucleus pulposus), lumbar    L5-S1   Hyperglycemia 10/20/2014   Hyperlipidemia    Hypertension    Hypothyroidism    Left bundle branch block (LBBB) 12/18/2017   Lumbar stenosis    Macular edema    Dr  Sherlynn Stalls    S/P minimally invasive aortic  valve replacement with bioprosthetic valve 12/17/2017   Edwards Intuity Elite rapid deployment stented bovine pericardial tissue valve via right mini thoracotomy approach    Past Surgical History:  Procedure Laterality Date   AORTIC VALVE REPLACEMENT N/A 12/17/2017   Procedure: MINIMALLY INVASIVE AORTIC VALVE REPLACEMENT (AVR) [ using Edwards Intuity Valve Size 49mm;  Surgeon: Rexene Alberts, MD;  Location: [place];  Service: Open Heart Surgery;  Laterality: N/A;  MINI THORACOTOMY   CARDIAC CATHETERIZATION  11/10/00   CATARACT EXTRACTION W/ INTRAOCULAR LENS IMPLANT     RIGHT EYE   CHOLECYSTECTOMY     EYE SURGERY     Right eye cataract   INGUINAL HERNIA REPAIR     (B)   LUMBAR LAMINECTOMY/DECOMPRESSION MICRODISCECTOMY N/A 04/23/2018   Procedure: Microlumbar decompression Lumbar five-Sacral one;  Surgeon: Susa Day, MD;  Location: [place];  Service: Orthopedics;  Laterality: N/A;   POLYPECTOMY     RIGHT/LEFT HEART CATH AND CORONARY ANGIOGRAPHY N/A 10/16/2017   Procedure: RIGHT/LEFT HEART CATH AND CORONARY ANGIOGRAPHY;  Surgeon: [name], MD;  Location: [place] [facility];  Service: Cardiovascular;  Laterality: N/A;   TEE WITHOUT CARDIOVERSION N/A 12/17/2017   Procedure: TRANSESOPHAGEAL ECHOCARDIOGRAM (TEE);  Surgeon: Rexene Alberts, MD;  Location: [place];  Service: Open Heart Surgery;  Laterality: N/A;   TONSILLECTOMY      Current Medications: Current Meds  Medication Sig   amLODipine (NORVASC) 5 MG tablet Take 0.5 tablets (2.5 mg total) by mouth daily.   aspirin EC 81 MG tablet Take 81 mg by mouth daily.   carvedilol (COREG) 12.5 MG tablet TAKE 1 TABLET (12.5 MG TOTAL) BY MOUTH 2 (TWO) TIMES DAILY WITH A MEAL.   Cholecalciferol (VITAMIN D-3 PO) Take 2,000 Units by mouth daily.   folic acid (FOLVITE) 1 MG tablet Take 2 tablets (2 mg total) by mouth daily.   levothyroxine (SYNTHROID) 88 MCG tablet Take 1 tablet (88 mcg total) by mouth daily before breakfast.    methotrexate (RHEUMATREX) 2.5 MG tablet TAKE 4 TABLETS BY MOUTH ONE TIME PER WEEK   predniSONE (DELTASONE) 5 MG tablet Take 1 tablet (5 mg total) by mouth daily with breakfast.   rosuvastatin (CRESTOR) 5 MG tablet TAKE 1 TABLET BY MOUTH EVERY DAY   spironolactone (ALDACTONE) 25 MG tablet TAKE 1 TABLET BY MOUTH EVERY DAY   telmisartan (MICARDIS) 40 MG tablet TAKE ONE TABLET (40 MG TOTAL) BY MOUTH DAILY in A.M. AND ONE TABLET (40 MG TOTAL) BY MOUTH IN AFTERNOON ON Monday & Thursday also.     Allergies:   Atorvastatin and Ezetimibe   Social History   Socioeconomic History   Marital status: Married    Spouse name: Not on file   Number of children: 3   Years of education: Not on file   Highest education level: Not on file  Occupational History   Occupation: retired- used to work for [employer]: 05/2008  Social Needs  Financial resource strain: Not on file   Food insecurity    Worry: Not on file    Inability: Not on file   Transportation needs    Medical: Not on file    Non-medical: Not on file  Tobacco Use   Smoking status: Former Smoker    Quit date: 04/15/1990    Years since quitting: 28.9   Smokeless tobacco: Never Used   Tobacco comment: used to smoke 2 ppd  Substance and Sexual Activity   Alcohol use: Not Currently    Comment: no ETOH since the 90s   Drug use: No   Sexual activity: Not on file  Lifestyle   Physical activity    Days per week: Not on file    Minutes per session: Not on file   Stress: Not on file  Relationships   Social connections    Talks on phone: Not on file    Gets together: Not on file    Attends religious service: Not on file    Active member of club or organization: Not on file    Attends meetings of clubs or organizations: Not on file    Relationship status: Not on file  Other Topics Concern   Not on file  Social History Narrative   Lives w/ wife   3 children, 8 g-kids               Family  History: The patient's family history includes COPD in his father; Coronary artery disease in his brother; Diabetes in his brother, daughter, and mother; Emphysema in his father; Stroke in his mother. There is no history of Colon cancer, Prostate cancer, Colon polyps, Esophageal cancer, Rectal cancer, or Stomach cancer. ROS:   Please see the history of present illness.    All other systems reviewed and are negative.  EKGs/Labs/Other Studies Reviewed:    The following studies were reviewed today:   Recent Labs: 11/17/2018: TSH 1.85 01/05/2019: ALT 21; Hemoglobin 14.0; Platelets 272 03/11/2019: BUN 15; Creatinine, Ser 1.15; Potassium 4.1; Sodium 140  Recent Lipid Panel    Component Value Date/Time   CHOL 179 03/11/2019 1020   CHOL 172 02/24/2018 1150   TRIG 196.0 (H) 03/11/2019 1020   TRIG 115 05/05/2006 0913   HDL 54.00 03/11/2019 1020   HDL 61 02/24/2018 1150   CHOLHDL 3 03/11/2019 1020   VLDL 39.2 03/11/2019 1020   LDLCALC 86 03/11/2019 1020   LDLCALC 84 02/24/2018 1150   LDLDIRECT 76.5 04/07/2008 1057    Physical Exam:    VS:  BP 122/82 (BP Location: Right Arm, Patient Position: Sitting, Cuff Size: Normal)    Pulse 71    Ht 5\' 9"  (1.753 m)    Wt 195 lb (88.5 kg)    SpO2 98%    BMI 28.80 kg/m     Wt Readings from Last 3 Encounters:  03/29/19 195 lb (88.5 kg)  03/11/19 193 lb 4 oz (87.7 kg)  02/16/19 193 lb (87.5 kg)     GEN:  Well nourished, well developed in no acute distress HEENT: Normal NECK: No JVD; No carotid bruits LYMPHATICS: No lymphadenopathy CARDIAC: RRR, no murmurs, rubs, gallops RESPIRATORY:  Clear to auscultation without rales, wheezing or rhonchi  ABDOMEN: Soft, non-tender, non-distended MUSCULOSKELETAL:  No edema; No deformity  SKIN: Warm and dry NEUROLOGIC:  Alert and oriented x 3 PSYCHIATRIC:  Normal affect    Signed, Shirlee More, MD  03/29/2019 1:29 PM    [facility]

## 2019-03-29 ENCOUNTER — Encounter: Payer: Self-pay | Admitting: Cardiology

## 2019-03-29 ENCOUNTER — Ambulatory Visit: Payer: Medicare Other | Admitting: Cardiology

## 2019-03-29 ENCOUNTER — Other Ambulatory Visit: Payer: Self-pay

## 2019-03-29 VITALS — BP 122/82 | HR 71 | Ht 69.0 in | Wt 195.0 lb

## 2019-03-29 DIAGNOSIS — I10 Essential (primary) hypertension: Secondary | ICD-10-CM | POA: Diagnosis not present

## 2019-03-29 DIAGNOSIS — Z953 Presence of xenogenic heart valve: Secondary | ICD-10-CM | POA: Diagnosis not present

## 2019-03-29 DIAGNOSIS — R42 Dizziness and giddiness: Secondary | ICD-10-CM

## 2019-03-29 DIAGNOSIS — E785 Hyperlipidemia, unspecified: Secondary | ICD-10-CM

## 2019-03-29 NOTE — Patient Instructions (Addendum)
Medication Instructions:  No medication changes today.   Would recommend you take your Carvedilol at least 90 minutes after your other morning medication.   *If you need a refill on your cardiac medications before your next appointment, please call your pharmacy*  Lab Work: No lab work today.  If you have labs (blood work) drawn today and your tests are completely normal, you will receive your results only by: Marland Kitchen MyChart Message (if you have MyChart) OR . A paper copy in the mail If you have any lab test that is abnormal or we need to change your treatment, we will call you to review the results.  Testing/Procedures: None ordered today.   Follow-Up: At [facility], you and your health needs are our priority.  As part of our continuing mission to provide you with exceptional heart care, we have created designated Provider Care Teams.  These Care Teams include your primary Cardiologist (physician) and Advanced Practice Providers (APPs -  Physician Assistants and Nurse Practitioners) who all work together to provide you with the care you need, when you need it.  Your next appointment:   6 months  The format for your next appointment:   In Person  Provider:   You may see Shirlee More, MD or the following Advanced Practice Provider on your designated Care Team:    Laurann Montana, FNP   Other Instructions Record blood pressure mid day or evening both sitting and standing.   [facility]  Tips to measure your blood pressure correctly  To determine whether you have hypertension, a medical professional will take a blood pressure reading. How you prepare for the test, the position of your arm, and other factors can change a blood pressure reading by 10% or more. That could be enough to hide high blood pressure, start you on a drug you don't really need, or lead your doctor to incorrectly adjust your medications. National and international guidelines offer specific instructions for  measuring blood pressure. If a doctor, nurse, or medical assistant isn't doing it right, don't hesitate to ask him or her to get with the guidelines. Here's what you can do to ensure a correct reading: . Don't drink a caffeinated beverage or smoke during the 30 minutes before the test. . Sit quietly for five minutes before the test begins. . During the measurement, sit in a chair with your feet on the floor and your arm supported so your elbow is at about heart level. . The inflatable part of the cuff should completely cover at least 80% of your upper arm, and the cuff should be placed on bare skin, not over a shirt. . Don't talk during the measurement. . Have your blood pressure measured twice, with a brief break in between. If the readings are different by 5 points or more, have it done a third time. There are times to break these rules. If you sometimes feel lightheaded when getting out of bed in the morning or when you stand after sitting, you should have your blood pressure checked while seated and then while standing to see if it falls from one position to the next. Because blood pressure varies throughout the day, your doctor will rarely diagnose hypertension on the basis of a single reading. Instead, he or she will want to confirm the measurements on at least two occasions, usually within a few weeks of one another. The exception to this rule is if you have a blood pressure reading of 180/110 mm Hg or higher.  A result this high usually calls for prompt treatment. It's also a good idea to have your blood pressure measured in both arms at least once, since the reading in one arm (usually the right) may be higher than that in the left. A 2014 study in The American Journal of Medicine of nearly 3,400 people found average arm- to-arm differences in systolic blood pressure of about 5 points. The higher number should be used to make treatment decisions. In 2017, new guidelines from the [employer], the [employer] of [employer], and nine other health organizations lowered the diagnosis of high blood pressure to 130/80 mm Hg or higher for all adults. The guidelines also redefined the various blood pressure categories to now include normal, elevated, Stage 1 hypertension, Stage 2 hypertension, and hypertensive crisis (see "Blood pressure categories"). Blood pressure categories  Blood pressure category SYSTOLIC (upper number)  DIASTOLIC (lower number)  Normal Less than 120 mm Hg and Less than 80 mm Hg  Elevated 120-129 mm Hg and Less than 80 mm Hg  High blood pressure: Stage 1 hypertension 130-139 mm Hg or 80-89 mm Hg  High blood pressure: Stage 2 hypertension 140 mm Hg or higher or 90 mm Hg or higher  Hypertensive crisis (consult your doctor immediately) Higher than 180 mm Hg and/or Higher than 120 mm Hg  Source: American Heart Association and American Stroke Association. For more on getting your blood pressure under control, buy Controlling Your Blood Pressure, a Special Health Report from [facility].

## 2019-03-30 ENCOUNTER — Ambulatory Visit: Payer: Medicare Other | Admitting: Cardiology

## 2019-04-03 ENCOUNTER — Other Ambulatory Visit: Payer: Self-pay | Admitting: Cardiology

## 2019-04-03 ENCOUNTER — Other Ambulatory Visit: Payer: Self-pay | Admitting: Rheumatology

## 2019-04-05 ENCOUNTER — Ambulatory Visit: Payer: Medicare Other | Admitting: Cardiology

## 2019-04-05 ENCOUNTER — Other Ambulatory Visit: Payer: Self-pay | Admitting: Rheumatology

## 2019-04-05 MED ORDER — PREDNISONE 1 MG PO TABS: 4.0000 mg | ORAL_TABLET | Freq: Every day | ORAL | 0 refills | Status: DC

## 2019-04-05 NOTE — Telephone Encounter (Signed)
Last Visit:01/05/2019 Next Visit:04/06/2019  Patient states he is currently on 5 mg of Prednisone and will taper to 4 mg on 04/11/19. Patient states he has enough 5 mg tablets to last. Him until 04/11/19.

## 2019-04-05 NOTE — Telephone Encounter (Signed)
Last Visit:01/05/2019 Next Visit:04/06/2019 Labs: 01/05/19 Labs are stable. Creatinine and GFR are WNL.  Okay to refill per Dr. Estanislado Pandy

## 2019-04-06 ENCOUNTER — Other Ambulatory Visit: Payer: Self-pay

## 2019-04-06 ENCOUNTER — Encounter: Payer: Self-pay | Admitting: Physician Assistant

## 2019-04-06 ENCOUNTER — Ambulatory Visit (INDEPENDENT_AMBULATORY_CARE_PROVIDER_SITE_OTHER): Payer: Medicare Other | Admitting: Rheumatology

## 2019-04-06 VITALS — BP 132/79 | HR 65 | Resp 13 | Ht 68.5 in | Wt 197.2 lb

## 2019-04-06 DIAGNOSIS — Z8659 Personal history of other mental and behavioral disorders: Secondary | ICD-10-CM

## 2019-04-06 DIAGNOSIS — M19042 Primary osteoarthritis, left hand: Secondary | ICD-10-CM

## 2019-04-06 DIAGNOSIS — M0600 Rheumatoid arthritis without rheumatoid factor, unspecified site: Secondary | ICD-10-CM | POA: Diagnosis not present

## 2019-04-06 DIAGNOSIS — Z8639 Personal history of other endocrine, nutritional and metabolic disease: Secondary | ICD-10-CM

## 2019-04-06 DIAGNOSIS — I10 Essential (primary) hypertension: Secondary | ICD-10-CM

## 2019-04-06 DIAGNOSIS — M19041 Primary osteoarthritis, right hand: Secondary | ICD-10-CM | POA: Diagnosis not present

## 2019-04-06 DIAGNOSIS — M170 Bilateral primary osteoarthritis of knee: Secondary | ICD-10-CM | POA: Diagnosis not present

## 2019-04-06 DIAGNOSIS — M19071 Primary osteoarthritis, right ankle and foot: Secondary | ICD-10-CM

## 2019-04-06 DIAGNOSIS — Z953 Presence of xenogenic heart valve: Secondary | ICD-10-CM

## 2019-04-06 DIAGNOSIS — M48061 Spinal stenosis, lumbar region without neurogenic claudication: Secondary | ICD-10-CM

## 2019-04-06 DIAGNOSIS — Z79899 Other long term (current) drug therapy: Secondary | ICD-10-CM

## 2019-04-06 DIAGNOSIS — Z840 Family history of diseases of the skin and subcutaneous tissue: Secondary | ICD-10-CM

## 2019-04-06 DIAGNOSIS — Z7952 Long term (current) use of systemic steroids: Secondary | ICD-10-CM

## 2019-04-06 DIAGNOSIS — I447 Left bundle-branch block, unspecified: Secondary | ICD-10-CM

## 2019-04-06 DIAGNOSIS — M19072 Primary osteoarthritis, left ankle and foot: Secondary | ICD-10-CM

## 2019-04-07 LAB — COMPLETE METABOLIC PANEL WITH GFR
AG Ratio: 2 (calc) (ref 1.0–2.5)
ALT: 21 U/L (ref 9–46)
AST: 27 U/L (ref 10–35)
Albumin: 4.7 g/dL (ref 3.6–5.1)
Alkaline phosphatase (APISO): 51 U/L (ref 35–144)
BUN: 12 mg/dL (ref 7–25)
CO2: 25 mmol/L (ref 20–32)
Calcium: 9.8 mg/dL (ref 8.6–10.3)
Chloride: 105 mmol/L (ref 98–110)
Creat: 1.03 mg/dL (ref 0.70–1.18)
GFR, Est African American: 83 mL/min/{1.73_m2} (ref >=60)
GFR, Est Non African American: 72 mL/min/{1.73_m2} (ref >=60)
Globulin: 2.3 g/dL (calc) (ref 1.9–3.7)
Glucose, Bld: 100 mg/dL — ABNORMAL HIGH (ref 65–99)
Potassium: 4.9 mmol/L (ref 3.5–5.3)
Sodium: 141 mmol/L (ref 135–146)
Total Bilirubin: 0.7 mg/dL (ref 0.2–1.2)
Total Protein: 7 g/dL (ref 6.1–8.1)

## 2019-04-07 LAB — CBC WITH DIFFERENTIAL/PLATELET
Absolute Monocytes: 566 cells/uL (ref 200–950)
Basophils Absolute: 8 cells/uL (ref 0–200)
Basophils Relative: 0.1 %
Eosinophils Absolute: 57 cells/uL (ref 15–500)
Eosinophils Relative: 0.7 %
HCT: 40.8 % (ref 38.5–50.0)
Hemoglobin: 13.9 g/dL (ref 13.2–17.1)
Lymphs Abs: 1156 cells/uL (ref 850–3900)
MCH: 34.1 pg — ABNORMAL HIGH (ref 27.0–33.0)
MCHC: 34.1 g/dL (ref 32.0–36.0)
MCV: 100 fL (ref 80.0–100.0)
MPV: 10.8 fL (ref 7.5–12.5)
Monocytes Relative: 6.9 %
Neutro Abs: 6412 cells/uL (ref 1500–7800)
Neutrophils Relative %: 78.2 %
Platelets: 253 10*3/uL (ref 140–400)
RBC: 4.08 10*6/uL — ABNORMAL LOW (ref 4.20–5.80)
RDW: 12.5 % (ref 11.0–15.0)
Total Lymphocyte: 14.1 %
WBC: 8.2 10*3/uL (ref 3.8–10.8)

## 2019-04-13 ENCOUNTER — Other Ambulatory Visit: Payer: Self-pay | Admitting: Cardiology

## 2019-04-20 ENCOUNTER — Other Ambulatory Visit: Payer: Self-pay | Admitting: Internal Medicine

## 2019-04-23 ENCOUNTER — Encounter: Payer: Self-pay | Admitting: Rheumatology

## 2019-04-23 ENCOUNTER — Other Ambulatory Visit: Payer: Self-pay

## 2019-04-23 DIAGNOSIS — Z8616 Personal history of COVID-19: Secondary | ICD-10-CM

## 2019-04-23 DIAGNOSIS — Z20822 Contact with and (suspected) exposure to covid-19: Secondary | ICD-10-CM

## 2019-04-23 HISTORY — DX: Personal history of COVID-19: Z86.16

## 2019-04-24 LAB — NOVEL CORONAVIRUS, NAA: SARS-CoV-2, NAA: DETECTED — AB

## 2019-04-26 ENCOUNTER — Ambulatory Visit: Payer: Medicare Other | Admitting: Internal Medicine

## 2019-04-26 ENCOUNTER — Emergency Department (HOSPITAL_COMMUNITY): Payer: Medicare Other

## 2019-04-26 ENCOUNTER — Emergency Department (HOSPITAL_COMMUNITY)
Admission: EM | Admit: 2019-04-26 | Discharge: 2019-04-26 | Disposition: A | Discharge status: Home / Self Care | Payer: Medicare Other | Source: Home / Self Care | Attending: Emergency Medicine | Admitting: Emergency Medicine

## 2019-04-26 ENCOUNTER — Encounter (HOSPITAL_COMMUNITY): Payer: Self-pay

## 2019-04-26 ENCOUNTER — Telehealth: Payer: Self-pay

## 2019-04-26 ENCOUNTER — Other Ambulatory Visit: Payer: Self-pay

## 2019-04-26 DIAGNOSIS — K4090 Unilateral inguinal hernia, without obstruction or gangrene, not specified as recurrent: Secondary | ICD-10-CM

## 2019-04-26 DIAGNOSIS — I714 Abdominal aortic aneurysm, without rupture, unspecified: Secondary | ICD-10-CM

## 2019-04-26 DIAGNOSIS — R55 Syncope and collapse: Secondary | ICD-10-CM | POA: Insufficient documentation

## 2019-04-26 DIAGNOSIS — R911 Solitary pulmonary nodule: Secondary | ICD-10-CM | POA: Insufficient documentation

## 2019-04-26 DIAGNOSIS — Z87891 Personal history of nicotine dependence: Secondary | ICD-10-CM | POA: Insufficient documentation

## 2019-04-26 DIAGNOSIS — S066X9A Traumatic subarachnoid hemorrhage with loss of consciousness of unspecified duration, initial encounter: Secondary | ICD-10-CM | POA: Diagnosis not present

## 2019-04-26 DIAGNOSIS — Z7982 Long term (current) use of aspirin: Secondary | ICD-10-CM | POA: Insufficient documentation

## 2019-04-26 DIAGNOSIS — Z79899 Other long term (current) drug therapy: Secondary | ICD-10-CM | POA: Insufficient documentation

## 2019-04-26 DIAGNOSIS — E039 Hypothyroidism, unspecified: Secondary | ICD-10-CM | POA: Insufficient documentation

## 2019-04-26 DIAGNOSIS — I10 Essential (primary) hypertension: Secondary | ICD-10-CM | POA: Insufficient documentation

## 2019-04-26 LAB — BASIC METABOLIC PANEL
Anion gap: 10 (ref 5–15)
BUN: 21 mg/dL (ref 8–23)
CO2: 20 mmol/L — ABNORMAL LOW (ref 22–32)
Calcium: 8.9 mg/dL (ref 8.9–10.3)
Chloride: 107 mmol/L (ref 98–111)
Creatinine, Ser: 1.37 mg/dL — ABNORMAL HIGH (ref 0.61–1.24)
GFR calc Af Amer: 59 mL/min — ABNORMAL LOW (ref >60)
GFR calc non Af Amer: 51 mL/min — ABNORMAL LOW (ref >60)
Glucose, Bld: 113 mg/dL — ABNORMAL HIGH (ref 70–99)
Potassium: 4.7 mmol/L (ref 3.5–5.1)
Sodium: 137 mmol/L (ref 135–145)

## 2019-04-26 LAB — CBC WITH DIFFERENTIAL/PLATELET
Abs Immature Granulocytes: 0.03 10*3/uL (ref 0.00–0.07)
Basophils Absolute: 0 10*3/uL (ref 0.0–0.1)
Basophils Relative: 0 %
Eosinophils Absolute: 0 10*3/uL (ref 0.0–0.5)
Eosinophils Relative: 0 %
HCT: 39.9 % (ref 39.0–52.0)
Hemoglobin: 13.3 g/dL (ref 13.0–17.0)
Immature Granulocytes: 1 %
Lymphocytes Relative: 12 %
Lymphs Abs: 0.7 10*3/uL (ref 0.7–4.0)
MCH: 34.4 pg — ABNORMAL HIGH (ref 26.0–34.0)
MCHC: 33.3 g/dL (ref 30.0–36.0)
MCV: 103.1 fL — ABNORMAL HIGH (ref 80.0–100.0)
Monocytes Absolute: 0.6 10*3/uL (ref 0.1–1.0)
Monocytes Relative: 10 %
Neutro Abs: 4 10*3/uL (ref 1.7–7.7)
Neutrophils Relative %: 77 %
Platelets: 186 10*3/uL (ref 150–400)
RBC: 3.87 MIL/uL — ABNORMAL LOW (ref 4.22–5.81)
RDW: 13.1 % (ref 11.5–15.5)
WBC: 5.3 10*3/uL (ref 4.0–10.5)
nRBC: 0 % (ref 0.0–0.2)

## 2019-04-26 LAB — HEPATIC FUNCTION PANEL
ALT: 25 U/L (ref 0–44)
AST: 33 U/L (ref 15–41)
Albumin: 3.8 g/dL (ref 3.5–5.0)
Alkaline Phosphatase: 56 U/L (ref 38–126)
Bilirubin, Direct: 0.2 mg/dL (ref 0.0–0.2)
Indirect Bilirubin: 1.1 mg/dL — ABNORMAL HIGH (ref 0.3–0.9)
Total Bilirubin: 1.3 mg/dL — ABNORMAL HIGH (ref 0.3–1.2)
Total Protein: 6.6 g/dL (ref 6.5–8.1)

## 2019-04-26 LAB — LIPASE, BLOOD: Lipase: 31 U/L (ref 11–51)

## 2019-04-26 MED ORDER — SODIUM CHLORIDE 0.9% FLUSH: 3.0000 mL | Freq: Once | INTRAVENOUS | Status: DC

## 2019-04-26 MED ORDER — IOHEXOL 300 MG/ML  SOLN
100.0000 mL | Freq: Once | INTRAMUSCULAR | Status: AC | PRN
  Administered 2019-04-26: 100 mL via INTRAVENOUS

## 2019-04-26 MED ORDER — SODIUM CHLORIDE 0.9 % IV BOLUS
500.0000 mL | Freq: Once | INTRAVENOUS | Status: AC
  Administered 2019-04-26: 500 mL via INTRAVENOUS

## 2019-04-26 NOTE — Discharge Instructions (Addendum)
Your home medications as previously prescribed. Follow-up with your primary care provider regarding today's visits. Return to ED for worsening symptoms, chest pain, shortness of breath, leg swelling, loss of consciousness.     Person Under Monitoring Name: Eric [name]  Location: [place] [id number]   Infection Prevention Recommendations for Individuals Confirmed to have, or Being Evaluated for, 2019 Novel Coronavirus (COVID-19) Infection Who Receive Care at Home  Individuals who are confirmed to have, or are being evaluated for, COVID-19 should follow the prevention steps below until a healthcare provider or local or state health department says they can return to normal activities.  Stay home except to get medical care You should restrict activities outside your home, except for getting medical care. Do not go to work, school, or public areas, and do not use public transportation or taxis.  Call ahead before visiting your doctor Before your medical appointment, call the healthcare provider and tell them that you have, or are being evaluated for, COVID-19 infection. This will help the healthcare providers office take steps to keep other people from getting infected. Ask your healthcare provider to call the local or state health department.  Monitor your symptoms Seek prompt medical attention if your illness is worsening (e.g., difficulty breathing). Before going to your medical appointment, call the healthcare provider and tell them that you have, or are being evaluated for, COVID-19 infection. Ask your healthcare provider to call the local or state health department.  Wear a facemask You should wear a facemask that covers your nose and mouth when you are in the same room with other people and when you visit a healthcare provider. People who live with or visit you should also wear a facemask while they are in the same room with you.  Separate yourself  from other people in your home As much as possible, you should stay in a different room from other people in your home. Also, you should use a separate bathroom, if available.  Avoid sharing household items You should not share dishes, drinking glasses, cups, eating utensils, towels, bedding, or other items with other people in your home. After using these items, you should wash them thoroughly with soap and water.  Cover your coughs and sneezes Cover your mouth and nose with a tissue when you cough or sneeze, or you can cough or sneeze into your sleeve. Throw used tissues in a lined trash can, and immediately wash your hands with soap and water for at least 20 seconds or use an alcohol-based hand rub.  Wash your [facility] your hands often and thoroughly with soap and water for at least 20 seconds. You can use an alcohol-based hand sanitizer if soap and water are not available and if your hands are not visibly dirty. Avoid touching your eyes, nose, and mouth with unwashed hands.   Prevention Steps for Caregivers and Household Members of Individuals Confirmed to have, or Being Evaluated for, COVID-19 Infection Being Cared for in the Home  If you live with, or provide care at home for, a person confirmed to have, or being evaluated for, COVID-19 infection please follow these guidelines to prevent infection:  Follow healthcare providers instructions Make sure that you understand and can help the patient follow any healthcare provider instructions for all care.  Provide for the patients basic needs You should help the patient with basic needs in the home and provide support for getting groceries, prescriptions, and other personal needs.  Monitor the patients symptoms  If they are getting sicker, call his or her medical provider and tell them that the patient has, or is being evaluated for, COVID-19 infection. This will help the healthcare providers office take steps to keep other  people from getting infected. Ask the healthcare provider to call the local or state health department.  Limit the number of people who have contact with the patient If possible, have only one caregiver for the patient. Other household members should stay in another home or place of residence. If this is not possible, they should stay in another room, or be separated from the patient as much as possible. Use a separate bathroom, if available. Restrict visitors who do not have an essential need to be in the home.  Keep older adults, very young children, and other sick people away from the patient Keep older adults, very young children, and those who have compromised immune systems or chronic health conditions away from the patient. This includes people with chronic heart, lung, or kidney conditions, diabetes, and cancer.  Ensure good ventilation Make sure that shared spaces in the home have good air flow, such as from an air conditioner or an opened window, weather permitting.  Wash your hands often Wash your hands often and thoroughly with soap and water for at least 20 seconds. You can use an alcohol based hand sanitizer if soap and water are not available and if your hands are not visibly dirty. Avoid touching your eyes, nose, and mouth with unwashed hands. Use disposable paper towels to dry your hands. If not available, use dedicated cloth towels and replace them when they become wet.  Wear a facemask and gloves Wear a disposable facemask at all times in the room and gloves when you touch or have contact with the patients blood, body fluids, and/or secretions or excretions, such as sweat, saliva, sputum, nasal mucus, vomit, urine, or feces.  Ensure the mask fits over your nose and mouth tightly, and do not touch it during use. Throw out disposable facemasks and gloves after using them. Do not reuse. Wash your hands immediately after removing your facemask and gloves. If your personal  clothing becomes contaminated, carefully remove clothing and launder. Wash your hands after handling contaminated clothing. Place all used disposable facemasks, gloves, and other waste in a lined container before disposing them with other household waste. Remove gloves and wash your hands immediately after handling these items.  Do not share dishes, glasses, or other household items with the patient Avoid sharing household items. You should not share dishes, drinking glasses, cups, eating utensils, towels, bedding, or other items with a patient who is confirmed to have, or being evaluated for, COVID-19 infection. After the person uses these items, you should wash them thoroughly with soap and water.  Wash laundry thoroughly Immediately remove and wash clothes or bedding that have blood, body fluids, and/or secretions or excretions, such as sweat, saliva, sputum, nasal mucus, vomit, urine, or feces, on them. Wear gloves when handling laundry from the patient. Read and follow directions on labels of laundry or clothing items and detergent. In general, wash and dry with the warmest temperatures recommended on the label.  Clean all areas the individual has used often Clean all touchable surfaces, such as counters, tabletops, doorknobs, bathroom fixtures, toilets, phones, keyboards, tablets, and bedside tables, every day. Also, clean any surfaces that may have blood, body fluids, and/or secretions or excretions on them. Wear gloves when cleaning surfaces the patient has come in contact with.  Use a diluted bleach solution (e.g., dilute bleach with 1 part bleach and 10 parts water) or a household disinfectant with a label that says EPA-registered for coronaviruses. To make a bleach solution at home, add 1 tablespoon of bleach to 1 quart (4 cups) of water. For a larger supply, add  cup of bleach to 1 gallon (16 cups) of water. Read labels of cleaning products and follow recommendations provided on product  labels. Labels contain instructions for safe and effective use of the cleaning product including precautions you should take when applying the product, such as wearing gloves or eye protection and making sure you have good ventilation during use of the product. Remove gloves and wash hands immediately after cleaning.  Monitor yourself for signs and symptoms of illness Caregivers and household members are considered close contacts, should monitor their health, and will be asked to limit movement outside of the home to the extent possible. Follow the monitoring steps for close contacts listed on the symptom monitoring form.   ? If you have additional questions, contact your local health department or call the epidemiologist on call at [phone] (available 24/7). ? This guidance is subject to change. For the most up-to-date guidance from [facility], please refer to their website: [email or web address]

## 2019-04-26 NOTE — ED Provider Notes (Signed)
[name] Provider Note   CSN: [id number] Arrival date & time: 04/26/19  1103     History   Chief Complaint Chief Complaint  Patient presents with  . Near Syncope    HPI [name] is a 73 y.o. male with a past medical history of LBBB, hypertension, hyperlipidemia status post aortic valve replacement with bioprosthetic valve last year presenting to the ED with episode that occurred prior to arrival.  Was in his usual state of health when he woke up this morning.  Reports feeling lightheaded when he stood and fell to the floor.  Denies any loss of consciousness.  He then began feeling dizzy and paresthesias up over his body.  Reports nausea.  Denies any vomiting, chest pain, headache or blurry vision.  He had a positive COVID-19 test on 04/23/2019.  Does report decreased appetite.  Reports that his stomach feels bloated.  Denies any diarrhea, focal weakness, shortness of breath.  His had intermittent cough for the past several months.     HPI  Past Medical History:  Diagnosis Date  . Abnormal LFTs   . Anxiety   . Aortic stenosis 04/15/2011  . Dizziness 04/21/2014  . Dyspnea   . Heart murmur   . HNP (herniated nucleus pulposus), lumbar    L5-S1  . Hyperglycemia 10/20/2014  . Hyperlipidemia   . Hypertension   . Hypothyroidism   . Left bundle branch block (LBBB) 12/18/2017  . Lumbar stenosis   . Macular edema    Dr  Sherlynn Stalls   . 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 Rheumatoid arthritis ([place]) 11/17/2018  . Spinal stenosis of lumbar region 04/23/2018  . HNP (herniated nucleus pulposus), lumbar 04/23/2018  . Prolapsed lumbar disc 03/03/2018  . Wide-complex tachycardia ([place]) 12/30/2017  . Left bundle branch  block (LBBB) 12/18/2017  . 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 Abnormal LFTs   . Macular edema   . PCP NOTES >>>>>> 04/27/2015  . Hyperglycemia 10/20/2014  . Dizziness 04/21/2014  . Annual physical exam 04/15/2011  . Aortic stenosis 04/15/2011  . Hypothyroidism 10/13/2007  . Hyperlipidemia 10/13/2007  . Hypertension 04/08/2007    Past Surgical History:  Procedure Laterality Date  . AORTIC VALVE REPLACEMENT N/A 12/17/2017   Procedure: MINIMALLY INVASIVE AORTIC VALVE REPLACEMENT (AVR) [ using Edwards Intuity Valve Size 86mm;  Surgeon: Rexene Alberts, MD;  Location: [facility];  Service: Open Heart Surgery;  Laterality: N/A;  MINI THORACOTOMY  . CARDIAC CATHETERIZATION  11/10/00  . CATARACT EXTRACTION W/ INTRAOCULAR LENS IMPLANT     RIGHT EYE  . CHOLECYSTECTOMY    . EYE SURGERY     Right eye cataract  . INGUINAL HERNIA REPAIR     (B)  . LUMBAR LAMINECTOMY/DECOMPRESSION MICRODISCECTOMY N/A 04/23/2018   Procedure: Microlumbar decompression Lumbar five-Sacral one;  Surgeon: Susa Day, MD;  Location: [facility];  Service: Orthopedics;  Laterality: N/A;  . POLYPECTOMY    . RIGHT/LEFT HEART CATH AND CORONARY ANGIOGRAPHY N/A 10/16/2017   Procedure: RIGHT/LEFT HEART CATH AND CORONARY ANGIOGRAPHY;  Surgeon: [name], MD;  Location: [facility];  Service: Cardiovascular;  Laterality: N/A;  . TEE WITHOUT CARDIOVERSION N/A 12/17/2017   Procedure: TRANSESOPHAGEAL ECHOCARDIOGRAM (TEE);  Surgeon: Rexene Alberts, MD;  Location: [facility];  Service: Open Heart Surgery;  Laterality: N/A;  . TONSILLECTOMY          Home Medications    Prior to Admission medications   Medication Sig Start Date End Date Taking? Authorizing Provider  amLODipine (NORVASC) 5 MG tablet Take 0.5 tablets (2.5 mg total) by mouth daily. 11/17/18   Colon Branch, MD  aspirin EC 81 MG tablet Take 81 mg by mouth daily.    [provider]  carvedilol (COREG) 12.5 MG  tablet TAKE 1 TABLET (12.5 MG TOTAL) BY MOUTH 2 (TWO) TIMES DAILY WITH A MEAL. 04/13/19   [name], MD  Cholecalciferol (VITAMIN D-3 PO) Take 2,000 Units by mouth daily.    [provider]  folic acid (FOLVITE) 1 MG tablet Take 2 tablets (2 mg total) by mouth daily. 09/25/18   [name], MD  levothyroxine (SYNTHROID) 88 MCG tablet Take 1 tablet (88 mcg total) by mouth daily before breakfast. 12/14/18   Colon Branch, MD  methotrexate (RHEUMATREX) 2.5 MG tablet TAKE 4 TABLETS BY MOUTH ONE TIME PER WEEK 04/05/19   [name], MD  predniSONE (DELTASONE) 5 MG tablet Take 1 tablet (5 mg total) by mouth daily with breakfast. 02/22/19   [name], MD  rosuvastatin (CRESTOR) 5 MG tablet Take 1 tablet (5 mg total) by mouth daily. 04/20/19   Colon Branch, MD  spironolactone (ALDACTONE) 25 MG tablet TAKE 1 TABLET BY MOUTH EVERY DAY 12/30/18   [name], MD  telmisartan (MICARDIS) 40 MG tablet TAKE ONE TABLET (40 MG TOTAL) BY MOUTH DAILY in A.M. AND ONE TABLET (40 MG TOTAL) BY MOUTH IN AFTERNOON ON Monday & Thursday also. 02/17/19   Loel Dubonnet, NP    Family History Family History  Problem Relation Age of Onset  . Diabetes Mother   . Stroke Mother   . Diabetes Brother        ?  . Coronary artery disease Brother        Male 1st degree relative >50, had CABG in his 27s  . Emphysema Father   . COPD Father   . Diabetes Daughter   . Colon cancer Neg Hx   . Prostate cancer Neg Hx   . Colon polyps Neg Hx   . Esophageal cancer Neg Hx   . Rectal cancer Neg Hx   . Stomach cancer Neg Hx     Social History Social History   Tobacco Use  . Smoking status: Former Smoker    Quit date: 04/15/1990    Years since quitting: 29.0  . Smokeless tobacco: Never Used  . Tobacco comment: used to smoke 2 ppd  Substance Use Topics  . Alcohol use: Not Currently    Comment: no ETOH since the 90s  . Drug use: No     Allergies   Atorvastatin and Ezetimibe   Review of  Systems Review of Systems  Constitutional: Negative for appetite change, chills and fever.  HENT: Negative for ear pain, rhinorrhea, sneezing and sore throat.   Eyes: Negative for photophobia and visual disturbance.  Respiratory: Positive for cough. Negative for chest tightness, shortness of breath and wheezing.   Cardiovascular: Negative for chest pain and palpitations.  Gastrointestinal: Negative for abdominal pain, blood in stool, constipation, diarrhea, nausea and vomiting.  Genitourinary: Negative for dysuria, hematuria and urgency.  Musculoskeletal: Negative for myalgias.  Skin: Negative for rash.  Neurological: Positive for dizziness and light-headedness. Negative for weakness.  Physical Exam Updated Vital Signs BP (!) 153/74   Pulse 75   Temp 97.9 F (36.6 C) (Oral)   Resp 16   Ht 5\' 6"  (1.676 m)   Wt 86.2 kg   SpO2 95%   BMI 30.67 kg/m   Physical Exam Vitals signs and nursing note reviewed.  Constitutional:      General: He is not in acute distress.    Appearance: He is well-developed.  HENT:     Head: Normocephalic and atraumatic.     Nose: Nose normal.  Eyes:     General: No scleral icterus.       Right eye: No discharge.        Left eye: No discharge.     Conjunctiva/sclera: Conjunctivae normal.     Pupils: Pupils are equal, round, and reactive to light.  Neck:     Musculoskeletal: Normal range of motion and neck supple.  Cardiovascular:     Rate and Rhythm: Normal rate and regular rhythm.     Heart sounds: Normal heart sounds. No murmur. No friction rub. No gallop.   Pulmonary:     Effort: Pulmonary effort is normal. No respiratory distress.     Breath sounds: Normal breath sounds.  Abdominal:     General: Bowel sounds are normal. There is distension.     Palpations: Abdomen is soft.     Tenderness: There is no abdominal tenderness. There is no guarding.  Musculoskeletal: Normal range of motion.  Skin:    General: Skin is warm and dry.      Findings: No rash.  Neurological:     General: No focal deficit present.     Mental Status: He is alert and oriented to person, place, and time.     Cranial Nerves: No cranial nerve deficit.     Sensory: No sensory deficit.     Motor: No weakness or abnormal muscle tone.     Coordination: Coordination normal.     Comments: Pupils reactive. No facial asymmetry noted. Cranial nerves appear grossly intact. Sensation intact to light touch on face, BUE and BLE. Strength 5/5 in BUE and BLE.       ED Treatments / Results  Labs (all labs ordered are listed, but only abnormal results are displayed) Labs Reviewed  BASIC METABOLIC PANEL - Abnormal; Notable for the following components:      Result Value   CO2 20 (*)    Glucose, Bld 113 (*)    Creatinine, Ser 1.37 (*)    GFR calc non Af Amer 51 (*)    GFR calc Af Amer 59 (*)    All other components within normal limits  CBC WITH DIFFERENTIAL/PLATELET - Abnormal; Notable for the following components:   RBC 3.87 (*)    MCV 103.1 (*)    MCH 34.4 (*)    All other components within normal limits  HEPATIC FUNCTION PANEL - Abnormal; Notable for the following components:   Total Bilirubin 1.3 (*)    Indirect Bilirubin 1.1 (*)    All other components within normal limits  LIPASE, BLOOD  URINALYSIS, ROUTINE W REFLEX MICROSCOPIC  CBG MONITORING, ED    EKG ED ECG REPORT   Date: 04/26/2019  Rate: 69  Rhythm: normal sinus rhythm  QRS Axis: normal  Intervals: normal  ST/T Wave abnormalities: normal  Conduction Disutrbances:left bundle branch block  Narrative Interpretation:   Old EKG Reviewed: unchanged  I have personally reviewed the EKG tracing and agree with the computerized  printout as noted.  Radiology Dg Chest Portable 1 View  Result Date: 04/26/2019 CLINICAL DATA:  Syncope EXAM: PORTABLE CHEST 1 VIEW COMPARISON:  07/21/2018 FINDINGS: The heart size and mediastinal contours are within normal limits. Both lungs are clear. The  visualized skeletal structures are unremarkable. Prior ORIF of the right anterior second rib. IMPRESSION: No active disease. Electronically Signed   By: Kathreen Devoid   On: 04/26/2019 13:11    Procedures Procedures (including critical care time)  Medications Ordered in ED Medications  sodium chloride flush (NS) 0.9 % injection 3 mL (has no administration in time range)  sodium chloride 0.9 % bolus 500 mL (0 mLs Intravenous Stopped 04/26/19 1236)     Initial Impression / Assessment and Plan / ED Course  I have reviewed the triage vital signs and the nursing notes.  Pertinent labs & imaging results that were available during my care of the patient were reviewed by me and considered in my medical decision making (see chart for details).         [name] was evaluated in Emergency Department on 04/26/2019 for the symptoms described in the history of present illness. He was evaluated in the context of the global COVID-19 pandemic, which necessitated consideration that the patient might be at risk for infection with the SARS-CoV-2 virus that causes COVID-19. Institutional protocols and algorithms that pertain to the evaluation of patients at risk for COVID-19 are in a state of rapid change based on information released by regulatory bodies including the CDC and federal and state organizations. These policies and algorithms were followed during the patient's care in the ED.   73 year old male with past medical history of LBBB, hypertension, hyperlipidemia presenting to the ED with syncopal episode that occurred prior to arrival.  Had a positive COVID-19 test done 3 days ago.  Reported feeling lightheaded and dizzy when he stood up.  Denies any loss of consciousness.  He had fallen to the floor but denies any head injury.  Reports that his stomach feels bloated.  Denies diarrhea, focal weakness, shortness of breath.  On exam patient is overall well-appearing.  Alert and oriented x4.  No  deficits neurological exam noted.  Abdomen is generally tender with distention but no rebound or guarding.  He states that abdominal pain has been going on for several weeks.  Lab work here is unremarkable.  EKG shows known LBBB.  Will obtain CT of the abdomen pelvis per request of PCP. Care handed off to oncoming provider pending CT. Will d/c home with PCP f/u if imaging is unremarkable.  Final Clinical Impressions(s) / ED Diagnoses   Final diagnoses:  Near syncope    ED Discharge Orders    None     Portions of this note were generated with Dragon dictation software. Dictation errors may occur despite best attempts at proofreading.    Delia Heady, PA-C 04/26/19 [place], [place], MD 04/27/19 [phone]

## 2019-04-26 NOTE — ED Notes (Signed)
CT is waiting for room 3 to become available before they can complete pt's scan

## 2019-04-26 NOTE — ED Triage Notes (Signed)
Per [facility] pt from home, recent covid + test on Friday, pt reports when he looked out his window he began getting light headed, stood up and fell on the floor, felt fine got himself up, checked on the dog outside when coming back to his office pt felt dizzy, having numbness and tingling all over, N/V, diaphoretic and pale.  22G LW  117/70 66 97% RA 16 CBG 122

## 2019-04-26 NOTE — Telephone Encounter (Signed)
Noted, spoke with the wife today.

## 2019-04-26 NOTE — ED Provider Notes (Signed)
  Physical Exam  BP 136/78   Pulse 69   Temp 97.9 F (36.6 C) (Oral)   Resp (!) 29   Ht 5\' 6"  (1.676 m)   Wt 86.2 kg   SpO2 97%   BMI 30.67 kg/m   Physical Exam  ED Course/Procedures     Procedures  MDM  Patient care assumed at 3:30 PM.  Patient has been having abdominal pain and distention for the last several weeks.  Patient was recently tested positive for Covid.  Signout pending CT abdomen pelvis.  7:43 PM CT showed no acute intra abdominal process. There is no obstruction. Small inguinal hernia and stable aortic aneurysm. Incidental pulmonary nodule that can be followed up. Patient's abdominal cramping likely secondary to Covid.  Told him to self quarantine at home and will prescribe some Zofran as needed for nausea.   Drenda Freeze, MD 04/26/19 [phone]

## 2019-04-26 NOTE — Telephone Encounter (Signed)
Pt and his wife [name] dx w/ COVID-19. Pt was scheduled at 11:20am- however is being taken via EMS to hospital. Appt cancelled.

## 2019-04-27 ENCOUNTER — Emergency Department (HOSPITAL_COMMUNITY): Payer: Medicare Other

## 2019-04-27 ENCOUNTER — Ambulatory Visit (INDEPENDENT_AMBULATORY_CARE_PROVIDER_SITE_OTHER): Payer: Medicare Other | Admitting: Internal Medicine

## 2019-04-27 ENCOUNTER — Encounter: Payer: Self-pay | Admitting: Internal Medicine

## 2019-04-27 ENCOUNTER — Telehealth: Payer: Self-pay | Admitting: *Deleted

## 2019-04-27 ENCOUNTER — Inpatient Hospital Stay (HOSPITAL_COMMUNITY)
Admission: EM | Admit: 2019-04-27 | Discharge: 2019-05-03 | DRG: 082 | Disposition: A | Discharge status: Skilled Nursing Facility | Payer: Medicare Other | Attending: Internal Medicine | Admitting: Internal Medicine

## 2019-04-27 ENCOUNTER — Telehealth: Payer: Self-pay

## 2019-04-27 ENCOUNTER — Encounter (HOSPITAL_COMMUNITY): Payer: Self-pay

## 2019-04-27 VITALS — BP 122/66 | HR 73 | Temp 97.9°F

## 2019-04-27 DIAGNOSIS — R55 Syncope and collapse: Secondary | ICD-10-CM

## 2019-04-27 DIAGNOSIS — Z8679 Personal history of other diseases of the circulatory system: Secondary | ICD-10-CM

## 2019-04-27 DIAGNOSIS — Z79890 Hormone replacement therapy (postmenopausal): Secondary | ICD-10-CM

## 2019-04-27 DIAGNOSIS — Z7952 Long term (current) use of systemic steroids: Secondary | ICD-10-CM

## 2019-04-27 DIAGNOSIS — R935 Abnormal findings on diagnostic imaging of other abdominal regions, including retroperitoneum: Secondary | ICD-10-CM

## 2019-04-27 DIAGNOSIS — I77811 Abdominal aortic ectasia: Secondary | ICD-10-CM | POA: Diagnosis present

## 2019-04-27 DIAGNOSIS — N179 Acute kidney failure, unspecified: Secondary | ICD-10-CM | POA: Diagnosis present

## 2019-04-27 DIAGNOSIS — I10 Essential (primary) hypertension: Secondary | ICD-10-CM | POA: Diagnosis present

## 2019-04-27 DIAGNOSIS — E039 Hypothyroidism, unspecified: Secondary | ICD-10-CM | POA: Diagnosis present

## 2019-04-27 DIAGNOSIS — E860 Dehydration: Secondary | ICD-10-CM | POA: Diagnosis present

## 2019-04-27 DIAGNOSIS — Z823 Family history of stroke: Secondary | ICD-10-CM

## 2019-04-27 DIAGNOSIS — R911 Solitary pulmonary nodule: Secondary | ICD-10-CM | POA: Diagnosis present

## 2019-04-27 DIAGNOSIS — Z9841 Cataract extraction status, right eye: Secondary | ICD-10-CM

## 2019-04-27 DIAGNOSIS — S02119A Unspecified fracture of occiput, initial encounter for closed fracture: Secondary | ICD-10-CM | POA: Diagnosis present

## 2019-04-27 DIAGNOSIS — I447 Left bundle-branch block, unspecified: Secondary | ICD-10-CM | POA: Diagnosis present

## 2019-04-27 DIAGNOSIS — Z825 Family history of asthma and other chronic lower respiratory diseases: Secondary | ICD-10-CM

## 2019-04-27 DIAGNOSIS — Z79899 Other long term (current) drug therapy: Secondary | ICD-10-CM

## 2019-04-27 DIAGNOSIS — U071 COVID-19: Secondary | ICD-10-CM | POA: Diagnosis present

## 2019-04-27 DIAGNOSIS — I609 Nontraumatic subarachnoid hemorrhage, unspecified: Secondary | ICD-10-CM

## 2019-04-27 DIAGNOSIS — Z7982 Long term (current) use of aspirin: Secondary | ICD-10-CM

## 2019-04-27 DIAGNOSIS — E785 Hyperlipidemia, unspecified: Secondary | ICD-10-CM | POA: Diagnosis present

## 2019-04-27 DIAGNOSIS — Z87891 Personal history of nicotine dependence: Secondary | ICD-10-CM

## 2019-04-27 DIAGNOSIS — Z888 Allergy status to other drugs, medicaments and biological substances status: Secondary | ICD-10-CM

## 2019-04-27 DIAGNOSIS — Z952 Presence of prosthetic heart valve: Secondary | ICD-10-CM

## 2019-04-27 DIAGNOSIS — M0600 Rheumatoid arthritis without rheumatoid factor, unspecified site: Secondary | ICD-10-CM | POA: Diagnosis present

## 2019-04-27 DIAGNOSIS — Z9089 Acquired absence of other organs: Secondary | ICD-10-CM

## 2019-04-27 DIAGNOSIS — Z961 Presence of intraocular lens: Secondary | ICD-10-CM | POA: Diagnosis present

## 2019-04-27 DIAGNOSIS — S066X9A Traumatic subarachnoid hemorrhage with loss of consciousness of unspecified duration, initial encounter: Principal | ICD-10-CM | POA: Diagnosis present

## 2019-04-27 DIAGNOSIS — Z8249 Family history of ischemic heart disease and other diseases of the circulatory system: Secondary | ICD-10-CM

## 2019-04-27 DIAGNOSIS — K4090 Unilateral inguinal hernia, without obstruction or gangrene, not specified as recurrent: Secondary | ICD-10-CM | POA: Diagnosis present

## 2019-04-27 DIAGNOSIS — R509 Fever, unspecified: Secondary | ICD-10-CM

## 2019-04-27 DIAGNOSIS — Z9049 Acquired absence of other specified parts of digestive tract: Secondary | ICD-10-CM

## 2019-04-27 DIAGNOSIS — W1839XA Other fall on same level, initial encounter: Secondary | ICD-10-CM | POA: Diagnosis present

## 2019-04-27 DIAGNOSIS — I951 Orthostatic hypotension: Secondary | ICD-10-CM | POA: Diagnosis present

## 2019-04-27 DIAGNOSIS — Z833 Family history of diabetes mellitus: Secondary | ICD-10-CM

## 2019-04-27 DIAGNOSIS — Y92009 Unspecified place in unspecified non-institutional (private) residence as the place of occurrence of the external cause: Secondary | ICD-10-CM

## 2019-04-27 HISTORY — DX: Personal history of other diseases of the circulatory system: Z86.79

## 2019-04-27 LAB — COMPREHENSIVE METABOLIC PANEL
ALT: 26 U/L (ref 0–44)
AST: 33 U/L (ref 15–41)
Albumin: 3.9 g/dL (ref 3.5–5.0)
Alkaline Phosphatase: 53 U/L (ref 38–126)
Anion gap: 10 (ref 5–15)
BUN: 19 mg/dL (ref 8–23)
CO2: 24 mmol/L (ref 22–32)
Calcium: 9 mg/dL (ref 8.9–10.3)
Chloride: 101 mmol/L (ref 98–111)
Creatinine, Ser: 1.28 mg/dL — ABNORMAL HIGH (ref 0.61–1.24)
GFR calc Af Amer: >60 mL/min (ref >60)
GFR calc non Af Amer: 55 mL/min — ABNORMAL LOW (ref >60)
Glucose, Bld: 100 mg/dL — ABNORMAL HIGH (ref 70–99)
Potassium: 4.1 mmol/L (ref 3.5–5.1)
Sodium: 135 mmol/L (ref 135–145)
Total Bilirubin: 1.2 mg/dL (ref 0.3–1.2)
Total Protein: 6.9 g/dL (ref 6.5–8.1)

## 2019-04-27 LAB — CBC WITH DIFFERENTIAL/PLATELET
Abs Immature Granulocytes: 0.02 10*3/uL (ref 0.00–0.07)
Basophils Absolute: 0 10*3/uL (ref 0.0–0.1)
Basophils Relative: 0 %
Eosinophils Absolute: 0 10*3/uL (ref 0.0–0.5)
Eosinophils Relative: 0 %
HCT: 39.8 % (ref 39.0–52.0)
Hemoglobin: 13.2 g/dL (ref 13.0–17.0)
Immature Granulocytes: 0 %
Lymphocytes Relative: 15 %
Lymphs Abs: 0.8 10*3/uL (ref 0.7–4.0)
MCH: 33.6 pg (ref 26.0–34.0)
MCHC: 33.2 g/dL (ref 30.0–36.0)
MCV: 101.3 fL — ABNORMAL HIGH (ref 80.0–100.0)
Monocytes Absolute: 0.6 10*3/uL (ref 0.1–1.0)
Monocytes Relative: 12 %
Neutro Abs: 3.8 10*3/uL (ref 1.7–7.7)
Neutrophils Relative %: 73 %
Platelets: 219 10*3/uL (ref 150–400)
RBC: 3.93 MIL/uL — ABNORMAL LOW (ref 4.22–5.81)
RDW: 13 % (ref 11.5–15.5)
WBC: 5.2 10*3/uL (ref 4.0–10.5)
nRBC: 0 % (ref 0.0–0.2)

## 2019-04-27 LAB — TROPONIN I (HIGH SENSITIVITY): Troponin I (High Sensitivity): 9 ng/L (ref <18)

## 2019-04-27 MED ORDER — ONDANSETRON HCL 4 MG/2ML IJ SOLN
4.0000 mg | Freq: Once | INTRAMUSCULAR | Status: AC
  Administered 2019-04-27: 4 mg via INTRAVENOUS
  Filled 2019-04-27: qty 2

## 2019-04-27 MED ORDER — SODIUM CHLORIDE 0.9 % IV BOLUS
1000.0000 mL | Freq: Once | INTRAVENOUS | Status: AC
  Administered 2019-04-28: 1000 mL via INTRAVENOUS

## 2019-04-27 NOTE — ED Notes (Signed)
Will perform OVS when pt returns from ct

## 2019-04-27 NOTE — Telephone Encounter (Signed)
Appt scheduled

## 2019-04-27 NOTE — ED Triage Notes (Signed)
Pt's wife heard pt fall from another room, unknown LOC. Pt not on thinners, placed in C-Collar for precautionary measures. EMS noted hematoma on back of the head, pt denying pain at this time. COVID + test on Friday VSS, A&Ox3 (unable to remember event and recent hospitalization)

## 2019-04-27 NOTE — ED Notes (Signed)
Pt c/o nausea, EDP aware.

## 2019-04-27 NOTE — ED Notes (Signed)
Patient son Mallory Justen asking for an update on patient Call when we can [phone]

## 2019-04-27 NOTE — Telephone Encounter (Signed)
Received DEXA results from [facility]   Date of Scan: 04/23/19 Lowest T-score and site measured: -1.1 Right Femoral Neck Significant changes in BMD and site measured (5% and above): n/a  Current Regimen: Patient taking Vitamin D.   Recommendation: Repeat in 2 years.

## 2019-04-27 NOTE — Telephone Encounter (Signed)
Needs ED f/u (virtually) for today please.

## 2019-04-27 NOTE — ED Provider Notes (Signed)
[facility] EMERGENCY DEPARTMENT Provider Note   CSN: [id number] Arrival date & time: 04/27/19  2007     History   Chief Complaint Chief Complaint  Patient presents with   Fall    HPI Elster Peterson is a 73 y.o. male hx of hyperlipidemia, high cholesterol, recent Covid infection here presenting with syncope. Patient had an episode of lightheaded and dizziness yesterday .  Patient came to the ER and has a mild AKI with a creatinine of 1.3 .  Patient has been having abdominal pain for the last several weeks and CT abdomen pelvis was requested by the primary care doctor and showed aneurysm that was stable.  Patient states that he had a virtual visit with his doctor today and his diuretic was decreased today .  He subsequently passed out and did not remember what happened.  He states that he woke up in the ambulance.  He has a history of aortic valve replacement but not currently on blood thinners.     The history is provided by the patient.    Past Medical History:  Diagnosis Date   Abnormal LFTs    Anxiety    Aortic stenosis 04/15/2011   Dizziness 04/21/2014   Dyspnea    Heart murmur    HNP (herniated nucleus pulposus), lumbar    L5-S1   Hyperglycemia 10/20/2014   Hyperlipidemia    Hypertension    Hypothyroidism    Left bundle branch block (LBBB) 12/18/2017   Lumbar stenosis    Macular edema    Dr  Sherlynn Stalls    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 12/17/2017   Edwards Intuity Elite rapid deployment stented bovine pericardial tissue valve via right mini thoracotomy approach    Patient Active Problem List   Diagnosis Date Noted   Rheumatoid arthritis ([place]) 11/17/2018   Spinal stenosis of lumbar region 04/23/2018   HNP (herniated nucleus pulposus), lumbar 04/23/2018   Prolapsed lumbar disc 03/03/2018   Wide-complex tachycardia ([place]) 12/30/2017   Left bundle branch block (LBBB) 12/18/2017    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 12/17/2017   Abnormal LFTs    Macular edema    PCP NOTES >>>>>> 04/27/2015   Hyperglycemia 10/20/2014   Dizziness 04/21/2014   Annual physical exam 04/15/2011   Aortic stenosis 04/15/2011   Hypothyroidism 10/13/2007   Hyperlipidemia 10/13/2007   Hypertension 04/08/2007    Past Surgical History:  Procedure Laterality Date   AORTIC VALVE REPLACEMENT N/A 12/17/2017   Procedure: MINIMALLY INVASIVE AORTIC VALVE REPLACEMENT (AVR) [ using Edwards Intuity Valve Size 51mm;  Surgeon: Rexene Alberts, MD;  Location: [place];  Service: Open Heart Surgery;  Laterality: N/A;  MINI THORACOTOMY   CARDIAC CATHETERIZATION  11/10/00   CATARACT EXTRACTION W/ INTRAOCULAR LENS IMPLANT     RIGHT EYE   CHOLECYSTECTOMY     EYE SURGERY     Right eye cataract   INGUINAL HERNIA REPAIR     (B)   LUMBAR LAMINECTOMY/DECOMPRESSION MICRODISCECTOMY N/A 04/23/2018   Procedure: Microlumbar decompression Lumbar five-Sacral one;  Surgeon: Susa Day, MD;  Location: [place];  Service: Orthopedics;  Laterality: N/A;   POLYPECTOMY     RIGHT/LEFT HEART CATH AND CORONARY ANGIOGRAPHY N/A 10/16/2017   Procedure: RIGHT/LEFT HEART CATH AND CORONARY ANGIOGRAPHY;  Surgeon: [name], MD;  Location: [place] [facility];  Service: Cardiovascular;  Laterality: N/A;   TEE WITHOUT CARDIOVERSION N/A 12/17/2017   Procedure: TRANSESOPHAGEAL ECHOCARDIOGRAM (TEE);  Surgeon:  Rexene Alberts, MD;  Location: [place];  Service: Open Heart Surgery;  Laterality: N/A;   TONSILLECTOMY          Home Medications    Prior to Admission medications   Medication Sig Start Date End Date Taking? Authorizing Provider  amLODipine (NORVASC) 5 MG tablet Take 0.5 tablets (2.5 mg total) by mouth daily. 11/17/18   Colon Branch, MD  aspirin EC 81 MG tablet Take 81 mg by mouth daily.    [provider]  carvedilol (COREG) 12.5 MG tablet TAKE 1 TABLET (12.5  MG TOTAL) BY MOUTH 2 (TWO) TIMES DAILY WITH A MEAL. 04/13/19   [name], MD  Cholecalciferol (VITAMIN D-3 PO) Take 2,000 Units by mouth daily.    [provider]  folic acid (FOLVITE) 1 MG tablet Take 2 tablets (2 mg total) by mouth daily. 09/25/18   [name], MD  levothyroxine (SYNTHROID) 88 MCG tablet Take 1 tablet (88 mcg total) by mouth daily before breakfast. 12/14/18   Colon Branch, MD  methotrexate (RHEUMATREX) 2.5 MG tablet TAKE 4 TABLETS BY MOUTH ONE TIME PER WEEK 04/05/19   [name], MD  predniSONE (DELTASONE) 5 MG tablet Take 1 tablet (5 mg total) by mouth daily with breakfast. 02/22/19   [name], MD  rosuvastatin (CRESTOR) 5 MG tablet Take 1 tablet (5 mg total) by mouth daily. 04/20/19   Colon Branch, MD  spironolactone (ALDACTONE) 25 MG tablet TAKE 1 TABLET BY MOUTH EVERY DAY 12/30/18   [name], MD  telmisartan (MICARDIS) 40 MG tablet TAKE ONE TABLET (40 MG TOTAL) BY MOUTH DAILY in A.M. AND ONE TABLET (40 MG TOTAL) BY MOUTH IN AFTERNOON ON Monday & Thursday also. 02/17/19   Loel Dubonnet, NP    Family History Family History  Problem Relation Age of Onset   Diabetes Mother    Stroke Mother    Diabetes Brother        ?   Coronary artery disease Brother        Male 1st degree relative >50, had CABG in his 29s   Emphysema Father    COPD Father    Diabetes Daughter    Colon cancer Neg Hx    Prostate cancer Neg Hx    Colon polyps Neg Hx    Esophageal cancer Neg Hx    Rectal cancer Neg Hx    Stomach cancer Neg Hx     Social History Social History   Tobacco Use   Smoking status: Former Smoker    Quit date: 04/15/1990    Years since quitting: 29.0   Smokeless tobacco: Never Used   Tobacco comment: used to smoke 2 ppd  Substance Use Topics   Alcohol use: Not Currently    Comment: no ETOH since the 90s   Drug use: No     Allergies   Atorvastatin and Ezetimibe   Review of Systems Review of Systems   Neurological: Positive for syncope and weakness.  All other systems reviewed and are negative.    Physical Exam Updated Vital Signs BP (!) 146/76    Pulse 79    Temp 99.8 F (37.7 C) (Oral)    Resp (!) 25    SpO2 96%   Physical Exam Vitals signs and nursing note reviewed.  Constitutional:      Comments: Dehydrated   HENT:     Head: Normocephalic.     Nose: Nose normal.     Mouth/Throat:  Mouth: Mucous membranes are dry.  Eyes:     Extraocular Movements: Extraocular movements intact.     Pupils: Pupils are equal, round, and reactive to light.  Neck:     Musculoskeletal: Normal range of motion.     Comments: C collar in place  Cardiovascular:     Rate and Rhythm: Normal rate and regular rhythm.     Pulses: Normal pulses.     Heart sounds: Normal heart sounds.  Pulmonary:     Effort: Pulmonary effort is normal.     Breath sounds: Normal breath sounds.  Abdominal:     General: Abdomen is flat.     Palpations: Abdomen is soft.  Musculoskeletal: Normal range of motion.     Comments: No obvious extremity injury.  No spinal tenderness.  Skin:    General: Skin is warm.     Capillary Refill: Capillary refill takes less than 2 seconds.  Neurological:     General: No focal deficit present.     Mental Status: He is oriented to person, place, and time.     Cranial Nerves: No cranial nerve deficit.     Sensory: No sensory deficit.     Motor: No weakness.     Coordination: Coordination normal.  Psychiatric:        Mood and Affect: Mood normal.        Behavior: Behavior normal.      ED Treatments / Results  Labs (all labs ordered are listed, but only abnormal results are displayed) Labs Reviewed  CBC WITH DIFFERENTIAL/PLATELET  COMPREHENSIVE METABOLIC PANEL  TROPONIN I (HIGH SENSITIVITY)    EKG EKG Interpretation  Date/Time:  Tuesday April 27 2019 20:21:36 EST Ventricular Rate:  81 PR Interval:    QRS Duration: 147 QT Interval:  407 QTC  Calculation: 473 R Axis:   60 Text Interpretation: Sinus rhythm Probable left atrial enlargement Left bundle branch block No significant change since last tracing Confirmed by Wandra Arthurs [phone]) on 04/27/2019 9:12:46 PM   Radiology Ct Abdomen Pelvis W Contrast  Result Date: 04/26/2019 CLINICAL DATA:  Abdominal distension, lightheadedness with fall EXAM: CT ABDOMEN AND PELVIS WITH CONTRAST TECHNIQUE: Multidetector CT imaging of the abdomen and pelvis was performed using the standard protocol following bolus administration of intravenous contrast. CONTRAST:  12mL OMNIPAQUE IOHEXOL 300 MG/ML  SOLN COMPARISON:  CTA November 10, 2017 FINDINGS: Lower chest: Basilar areas of atelectasis and/or scarring. 8 mm ground-glass nodule present in the right lower lobe. Normal heart size. Aortic valve replacement is noted. Coronary artery calcifications are present. No pericardial effusion. Hepatobiliary: No focal liver abnormality is seen. Patient is post cholecystectomy. Slight prominence of the biliary tree likely related to reservoir effect. No calcified intraductal gallstones. Pancreas: Unremarkable. No pancreatic ductal dilatation or surrounding inflammatory changes. Spleen: Normal in size without focal abnormality. Adrenals/Urinary Tract: Adrenal glands are normal. Mild bilateral symmetric perinephric stranding, a nonspecific finding though may correlate with either age or decreased renal function. Subcentimeter hypoattenuating focus in the upper pole left kidney is too small to fully characterize on CT imaging but statistically likely benign. The kidneys are otherwise unremarkable, without renal calculi, suspicious lesion, or hydronephrosis. No extravasation of contrast is seen on excretory phase delayed imaging. Portion of the right anterolateral bladder protrudes into the right fat containing hernia. No resulting obstruction. Stomach/Bowel: Distal esophagus, stomach and duodenal sweep are unremarkable. No small  bowel wall thickening or dilatation. No evidence of obstruction. A normal appendix is visualized. No colonic dilatation or  wall thickening. Scattered colonic diverticula without focal pericolonic inflammation to suggest diverticulitis. Vascular/Lymphatic: There dominantly aorta is tortuous and calcified with bilobed fusiform ectatic segments. The first arising just below the level of the renal arteries measures up to 2.4 cm in diameter with some eccentric mural thrombus. The second meant just proximal to the bifurcation measures up to 2.7 cm. No extension into the iliac arteries. No proximal occlusions. No suspicious or enlarged lymph nodes in the included lymphatic chains. Reproductive: Coarse eccentric calcification of the prostate. No concerning abnormalities of the prostate or seminal vesicles. Other: Enlarging fat containing right inguinal hernia with portion the right anterolateral bladder protruding through the hernia aperture. Smaller fat containing left inguinal hernia and tiny umbilical hernia are present as well. No bowel containing hernias or resulting obstruction. No free air or fluid in the abdomen or pelvis. Musculoskeletal: Multilevel degenerative changes are present in the imaged portions of the spine. No acute osseous abnormality or suspicious osseous lesion. IMPRESSION: 1. No acute intra-abdominal process. Few air-filled loops of bowel without frank distention or obstruction. 2. Slightly enlarging fat containing right inguinal hernia with portion of the right anterolateral bladder protruding through the hernia aperture. No bowel containing hernias. 3. Bilobed fusiform infrarenal abdominal aortic ectasia measuring with lobulations measuring up to 2.4 cm and 2.7 cm in diameter, not significantly changed from prior study. Ectatic abdominal aorta at risk for aneurysm development. Recommend followup by ultrasound in 5 years. This recommendation follows ACR consensus guidelines: White Paper of the ACR  Incidental Findings Committee II on Vascular Findings. J Am Coll Radiol 2013; 10:789-794. Aortic aneurysm NOS (ICD10-I71.9) 4. 8 mm ground-glass nodule in the right lower lobe. Initial follow-up with CT at 6-12 months is recommended to confirm persistence. If persistent, repeat CT is recommended every 2 years until 5 years of stability has been established. This recommendation follows the consensus statement: Guidelines for Management of Incidental Pulmonary Nodules Detected on CT Images: From the Fleischner Society 2017; Radiology 2017; 284:228-243. 5. Aortic Atherosclerosis (ICD10-I70.0). 6. Prior aortic valve replacement. Electronically Signed   By: Lovena Le M.D.   On: 04/26/2019 19:29   Dg Chest Portable 1 View  Result Date: 04/26/2019 CLINICAL DATA:  Syncope EXAM: PORTABLE CHEST 1 VIEW COMPARISON:  07/21/2018 FINDINGS: The heart size and mediastinal contours are within normal limits. Both lungs are clear. The visualized skeletal structures are unremarkable. Prior ORIF of the right anterior second rib. IMPRESSION: No active disease. Electronically Signed   By: Kathreen Devoid   On: 04/26/2019 13:11    Procedures Procedures (including critical care time)  Medications Ordered in ED Medications  sodium chloride 0.9 % bolus 1,000 mL (has no administration in time range)     Initial Impression / Assessment and Plan / ED Course  I have reviewed the triage vital signs and the nursing notes.  Pertinent labs & imaging results that were available during my care of the patient were reviewed by me and considered in my medical decision making (see chart for details).       Deriel Cavanah is a 73 y.o. male here with weakness, syncope. He was tested positive for COVID on 11/13. He has no SOB but had near syncope yesterday. Today he passed out and was on the floor.  Patient appears dehydrated.  Will get CT head neck and repeat labs and troponin. Will likely need hydration and admit the patient for  recurrent syncope.   11 :30 pm Labs showed Cr stable, troponin negative. CT head/neck  pending. Anticipate admission for syncope. Signed out to Dr. Christy Gentles in the ED.    Final Clinical Impressions(s) / ED Diagnoses   Final diagnoses:  None    ED Discharge Orders    None       Drenda Freeze, MD 04/27/19 2332

## 2019-04-27 NOTE — ED Provider Notes (Signed)
At signout f/u on CT imaging Admit for syncope    Ripley Fraise, MD 04/27/19 2320

## 2019-04-27 NOTE — Progress Notes (Signed)
Subjective:    Patient ID: Eric [name], male    DOB: 03-16-1946, 73 y.o.   MRN: [id number]  DOS:  04/27/2019 Type of visit - description: Attempted  to make this a video visit, due to technical difficulties from the patient side it was not possible  thus we proceeded with a Virtual Visit via Telephone    I connected with@   by telephone and verified that I am speaking with the correct person using two identifiers.  THIS ENCOUNTER IS A VIRTUAL VISIT DUE TO COVID-19 - PATIENT WAS NOT SEEN IN THE OFFICE. PATIENT HAS CONSENTED TO VIRTUAL VISIT / TELEMEDICINE VISIT   Location of patient: home  Location of provider: office  I discussed the limitations, risks, security and privacy concerns of performing an evaluation and management service by telephone and the availability of in person appointments. I also discussed with the patient that there may be a patient responsible charge related to this service. The patient expressed understanding and agreed to proceed.   History of Present Illness: ER follow-up Went to the ER yesterday after he woke up  felt lightheaded. No LOC. He did have nausea but no vomiting. He was COVID-19 + on 04/23/2019, 3 days prior to the ER evaluation.   Work-up reviewed:  BMP show a creatinine of 1.37, slightly higher than baseline.  CBC looks good, LFTs normal, lipase normal, Chest x-ray negative, a CT of the abdomen was done due to decreased appetite and stomach bloating showing no acute process but and number of incidental findings.   Review of Systems The patient is here for follow-up. As described above, he was diagnosed with COVID-19 few days prior to the ER evaluation. He was having mild cough, diarrhea, like he had a "cold". He did not have fever, chills, lost of smell/taste or headache. No chest pain, some shortness of breath.  Since he leaves the ER, he is feeling slightly better. Still has diarrhea, not as bad as before, no blood in the stools. He  walk today, approximately 30 steps to pick up his mail and he got lightheaded and had to rest.  BP today 122/66, temperature 97.9.  Past Medical History:  Diagnosis Date  . Abnormal LFTs   . Anxiety   . Aortic stenosis 04/15/2011  . Dizziness 04/21/2014  . Dyspnea   . Heart murmur   . HNP (herniated nucleus pulposus), lumbar    L5-S1  . Hyperglycemia 10/20/2014  . Hyperlipidemia   . Hypertension   . Hypothyroidism   . Left bundle branch block (LBBB) 12/18/2017  . Lumbar stenosis   . Macular edema    Dr  Sherlynn Stalls   . 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 AORTIC VALVE REPLACEMENT N/A 12/17/2017   Procedure: MINIMALLY INVASIVE AORTIC VALVE REPLACEMENT (AVR) [ using Edwards Intuity Valve Size 34mm;  Surgeon: Rexene Alberts, MD;  Location: [place];  Service: Open Heart Surgery;  Laterality: N/A;  MINI THORACOTOMY  . CARDIAC CATHETERIZATION  11/10/00  . CATARACT EXTRACTION W/ INTRAOCULAR LENS IMPLANT     RIGHT EYE  . CHOLECYSTECTOMY    . EYE SURGERY     Right eye cataract  . INGUINAL HERNIA REPAIR     (B)  . LUMBAR LAMINECTOMY/DECOMPRESSION MICRODISCECTOMY N/A 04/23/2018   Procedure: Microlumbar decompression Lumbar five-Sacral one;  Surgeon: Susa Day, MD;  Location: [place];  Service: Orthopedics;  Laterality: N/A;  . POLYPECTOMY    . RIGHT/LEFT HEART CATH AND CORONARY ANGIOGRAPHY N/A 10/16/2017   Procedure: RIGHT/LEFT HEART CATH AND CORONARY ANGIOGRAPHY;  Surgeon: [name], MD;  Location: [place] [facility];  Service: Cardiovascular;  Laterality: N/A;  . TEE WITHOUT CARDIOVERSION N/A 12/17/2017   Procedure: TRANSESOPHAGEAL ECHOCARDIOGRAM (TEE);  Surgeon: Rexene Alberts, MD;  Location: [place];  Service: Open Heart Surgery;  Laterality: N/A;  .  TONSILLECTOMY      Social History   Socioeconomic History  . Marital status: Married    Spouse name: Not on file  . Number of children: 3  . Years of education: Not on file  . Highest education level: Not on file  Occupational History  . Occupation: retired- used to work for [employer]: 05/2008  Social Needs  . Financial resource strain: Not on file  . Food insecurity    Worry: Not on file    Inability: Not on file  . Transportation needs    Medical: Not on file    Non-medical: Not on file  Tobacco Use  . Smoking status: Former Smoker    Quit date: 04/15/1990    Years since quitting: 29.0  . Smokeless tobacco: Never Used  . Tobacco comment: used to smoke 2 ppd  Substance and Sexual Activity  . Alcohol use: Not Currently    Comment: no ETOH since the 90s  . Drug use: No  . Sexual activity: Not on file  Lifestyle  . Physical activity    Days per week: Not on file    Minutes per session: Not on file  . Stress: Not on file  Relationships  . Social Herbalist on phone: Not on file    Gets together: Not on file    Attends religious service: Not on file    Active member of club or organization: Not on file    Attends meetings of clubs or organizations: Not on file    Relationship status: Not on file  . Intimate partner violence    Fear of current or ex partner: Not on file    Emotionally abused: Not on file    Physically abused: Not on file    Forced sexual activity: Not on file  Other Topics Concern  . Not on file  Social History Narrative   Lives w/ wife   3 children, 8 g-kids                Allergies as of 04/27/2019      Reactions   Atorvastatin Other (See Comments)   REACTION: myalgia   Ezetimibe Other (See Comments)   REACTION: myalgia      Medication List       Accurate as of April 27, 2019  8:07 PM. If you have any questions, ask your nurse or doctor.        amLODipine 5 MG tablet Commonly known as: NORVASC Take 0.5  tablets (2.5 mg total) by mouth daily.   aspirin EC 81 MG tablet Take 81 mg by mouth daily.   carvedilol 12.5 MG tablet Commonly known as: COREG TAKE 1 TABLET (12.5 MG TOTAL) BY MOUTH 2 (TWO) TIMES DAILY WITH A MEAL.   folic acid 1 MG tablet Commonly known as: FOLVITE Take 2 tablets (2 mg total) by mouth daily.   levothyroxine 88 MCG tablet Commonly known as: SYNTHROID  Take 1 tablet (88 mcg total) by mouth daily before breakfast.   methotrexate 2.5 MG tablet Commonly known as: RHEUMATREX TAKE 4 TABLETS BY MOUTH ONE TIME PER WEEK   predniSONE 1 MG tablet Commonly known as: DELTASONE Take 4 mg by mouth daily with breakfast.   predniSONE 5 MG tablet Commonly known as: DELTASONE Take 1 tablet (5 mg total) by mouth daily with breakfast.   rosuvastatin 5 MG tablet Commonly known as: CRESTOR Take 1 tablet (5 mg total) by mouth daily.   spironolactone 25 MG tablet Commonly known as: ALDACTONE TAKE 1 TABLET BY MOUTH EVERY DAY   telmisartan 40 MG tablet Commonly known as: MICARDIS TAKE ONE TABLET (40 MG TOTAL) BY MOUTH DAILY in A.M. AND ONE TABLET (40 MG TOTAL) BY MOUTH IN AFTERNOON ON Monday & Thursday also.   VITAMIN D-3 PO Take 2,000 Units by mouth daily.           Objective:   Physical Exam BP 122/66   Pulse 73   Temp 97.9 F (36.6 C)  This is a virtual phone visit, he is alert oriented x3, in no distress.  Speaking in complete sentences    Assessment     Assessment HTN Hyperlipidemia Hypothyroidism R.A. dx 2020 Increase LFTs Ultrasound 2002 done for increased LFTs: Normal liver; Hep C negative 02-16-15 Aortic stenosis , dx  2012 , s/p AoVR 12/2017 Macular degeneration, cataracts  +FH CAD  --Pt's Brother has CAD dx age 36s --Patient himself has a cardiac catheterization with minimal to no disease 10/2017  PLAN COVID-19: Diagnosed with COVID-19 approximately 4 days ago, having diarrhea, mild cough, generalized weakness. Went to the ER with near syncope,  work-up showed with elevated creatinine, no hypoxia, multiple findings on the CT. At this point, he is a still not feeling well but I believe he is okay to stay home with the following instructions: Push fluids, increase hydration Hold Micardis and spironolactone for the next 2 to 3 days, check BPs and send me a report in 3 days Pepto-Bismol as needed Reassess in person in 2 weeks, sooner if needed. HTN: As above Abnormal CT: Multiple findings, patient aware of them, will address them at the next visit. Findings include: + Right inguinal hernia with part of the bladder protruding through it.  Consider surgical referral. + Infrarenal abdominal aortic ectasis at risk for aneurysm. Recommend follow-up in 5 years  Also 4.8 mm millimeter groundglass nodule on the right lower lobe, CT in 6 to 12 months recommended. Instructions sent to the patient via message      I discussed the assessment and treatment plan with the patient. The patient was provided an opportunity to ask questions and all were answered. The patient agreed with the plan and demonstrated an understanding of the instructions.   The patient was advised to call back or seek an in-person evaluation if the symptoms worsen or if the condition fails to improve as anticipated.  I provided 25 minutes of non-face-to-face time during this encounter.  Kathlene November, MD

## 2019-04-27 NOTE — ED Notes (Signed)
Pt AAO x4 but no memory of the event. Pt does remember what he was doing earlier this morning. Pt states he has pain in the back of his head and states, "I must have hit my head pretty hard." pupils equal and reactive.

## 2019-04-28 ENCOUNTER — Telehealth: Payer: Self-pay

## 2019-04-28 ENCOUNTER — Inpatient Hospital Stay (HOSPITAL_COMMUNITY): Payer: Medicare Other

## 2019-04-28 ENCOUNTER — Encounter (HOSPITAL_COMMUNITY): Payer: Self-pay | Admitting: Radiology

## 2019-04-28 ENCOUNTER — Other Ambulatory Visit: Payer: Self-pay

## 2019-04-28 ENCOUNTER — Telehealth: Payer: Self-pay | Admitting: *Deleted

## 2019-04-28 DIAGNOSIS — Z823 Family history of stroke: Secondary | ICD-10-CM | POA: Diagnosis not present

## 2019-04-28 DIAGNOSIS — Z9049 Acquired absence of other specified parts of digestive tract: Secondary | ICD-10-CM | POA: Diagnosis not present

## 2019-04-28 DIAGNOSIS — I951 Orthostatic hypotension: Secondary | ICD-10-CM | POA: Diagnosis present

## 2019-04-28 DIAGNOSIS — I77811 Abdominal aortic ectasia: Secondary | ICD-10-CM | POA: Diagnosis present

## 2019-04-28 DIAGNOSIS — R911 Solitary pulmonary nodule: Secondary | ICD-10-CM | POA: Diagnosis present

## 2019-04-28 DIAGNOSIS — S02119A Unspecified fracture of occiput, initial encounter for closed fracture: Secondary | ICD-10-CM | POA: Diagnosis present

## 2019-04-28 DIAGNOSIS — I609 Nontraumatic subarachnoid hemorrhage, unspecified: Secondary | ICD-10-CM

## 2019-04-28 DIAGNOSIS — W1839XA Other fall on same level, initial encounter: Secondary | ICD-10-CM | POA: Diagnosis present

## 2019-04-28 DIAGNOSIS — E785 Hyperlipidemia, unspecified: Secondary | ICD-10-CM | POA: Diagnosis present

## 2019-04-28 DIAGNOSIS — U071 COVID-19: Secondary | ICD-10-CM | POA: Diagnosis present

## 2019-04-28 DIAGNOSIS — R509 Fever, unspecified: Secondary | ICD-10-CM | POA: Diagnosis not present

## 2019-04-28 DIAGNOSIS — Z8249 Family history of ischemic heart disease and other diseases of the circulatory system: Secondary | ICD-10-CM | POA: Diagnosis not present

## 2019-04-28 DIAGNOSIS — Z87891 Personal history of nicotine dependence: Secondary | ICD-10-CM | POA: Diagnosis not present

## 2019-04-28 DIAGNOSIS — R55 Syncope and collapse: Secondary | ICD-10-CM

## 2019-04-28 DIAGNOSIS — Z833 Family history of diabetes mellitus: Secondary | ICD-10-CM | POA: Diagnosis not present

## 2019-04-28 DIAGNOSIS — E860 Dehydration: Secondary | ICD-10-CM | POA: Diagnosis present

## 2019-04-28 DIAGNOSIS — M0600 Rheumatoid arthritis without rheumatoid factor, unspecified site: Secondary | ICD-10-CM | POA: Diagnosis present

## 2019-04-28 DIAGNOSIS — I447 Left bundle-branch block, unspecified: Secondary | ICD-10-CM | POA: Diagnosis present

## 2019-04-28 DIAGNOSIS — Y92009 Unspecified place in unspecified non-institutional (private) residence as the place of occurrence of the external cause: Secondary | ICD-10-CM | POA: Diagnosis not present

## 2019-04-28 DIAGNOSIS — Z9841 Cataract extraction status, right eye: Secondary | ICD-10-CM | POA: Diagnosis not present

## 2019-04-28 DIAGNOSIS — E039 Hypothyroidism, unspecified: Secondary | ICD-10-CM | POA: Diagnosis present

## 2019-04-28 DIAGNOSIS — N179 Acute kidney failure, unspecified: Secondary | ICD-10-CM | POA: Diagnosis present

## 2019-04-28 DIAGNOSIS — Z961 Presence of intraocular lens: Secondary | ICD-10-CM | POA: Diagnosis present

## 2019-04-28 DIAGNOSIS — I10 Essential (primary) hypertension: Secondary | ICD-10-CM | POA: Diagnosis present

## 2019-04-28 DIAGNOSIS — S066X9A Traumatic subarachnoid hemorrhage with loss of consciousness of unspecified duration, initial encounter: Secondary | ICD-10-CM | POA: Diagnosis not present

## 2019-04-28 DIAGNOSIS — Z9089 Acquired absence of other organs: Secondary | ICD-10-CM | POA: Diagnosis not present

## 2019-04-28 DIAGNOSIS — K4090 Unilateral inguinal hernia, without obstruction or gangrene, not specified as recurrent: Secondary | ICD-10-CM | POA: Diagnosis present

## 2019-04-28 DIAGNOSIS — Z825 Family history of asthma and other chronic lower respiratory diseases: Secondary | ICD-10-CM | POA: Diagnosis not present

## 2019-04-28 DIAGNOSIS — Z952 Presence of prosthetic heart valve: Secondary | ICD-10-CM | POA: Diagnosis not present

## 2019-04-28 HISTORY — DX: Nontraumatic subarachnoid hemorrhage, unspecified: I60.9

## 2019-04-28 LAB — COMPREHENSIVE METABOLIC PANEL
ALT: 22 U/L (ref 0–44)
AST: 29 U/L (ref 15–41)
Albumin: 3.5 g/dL (ref 3.5–5.0)
Alkaline Phosphatase: 50 U/L (ref 38–126)
Anion gap: 12 (ref 5–15)
BUN: 14 mg/dL (ref 8–23)
CO2: 21 mmol/L — ABNORMAL LOW (ref 22–32)
Calcium: 8.6 mg/dL — ABNORMAL LOW (ref 8.9–10.3)
Chloride: 103 mmol/L (ref 98–111)
Creatinine, Ser: 1.15 mg/dL (ref 0.61–1.24)
GFR calc Af Amer: >60 mL/min (ref >60)
GFR calc non Af Amer: >60 mL/min (ref >60)
Glucose, Bld: 129 mg/dL — ABNORMAL HIGH (ref 70–99)
Potassium: 4.1 mmol/L (ref 3.5–5.1)
Sodium: 136 mmol/L (ref 135–145)
Total Bilirubin: 1 mg/dL (ref 0.3–1.2)
Total Protein: 6.2 g/dL — ABNORMAL LOW (ref 6.5–8.1)

## 2019-04-28 LAB — URINALYSIS, COMPLETE (UACMP) WITH MICROSCOPIC
Bacteria, UA: NONE SEEN
Bilirubin Urine: NEGATIVE
Glucose, UA: NEGATIVE mg/dL
Ketones, ur: 5 mg/dL — AB
Leukocytes,Ua: NEGATIVE
Nitrite: NEGATIVE
Protein, ur: NEGATIVE mg/dL
Specific Gravity, Urine: 1.02 (ref 1.005–1.030)
pH: 5 (ref 5.0–8.0)

## 2019-04-28 LAB — ECHOCARDIOGRAM COMPLETE
Height: 66 in
Weight: 3039.98 oz

## 2019-04-28 LAB — FERRITIN: Ferritin: 397 ng/mL — ABNORMAL HIGH (ref 24–336)

## 2019-04-28 LAB — PHOSPHORUS: Phosphorus: 3 mg/dL (ref 2.5–4.6)

## 2019-04-28 LAB — MAGNESIUM: Magnesium: 1.9 mg/dL (ref 1.7–2.4)

## 2019-04-28 LAB — TROPONIN I (HIGH SENSITIVITY): Troponin I (High Sensitivity): 21 ng/L — ABNORMAL HIGH (ref <18)

## 2019-04-28 LAB — D-DIMER, QUANTITATIVE: D-Dimer, Quant: 5.3 ug/mL-FEU — ABNORMAL HIGH (ref 0.00–0.50)

## 2019-04-28 LAB — C-REACTIVE PROTEIN: CRP: <0.8 mg/dL (ref <1.0)

## 2019-04-28 LAB — TSH: TSH: 1.391 u[IU]/mL (ref 0.350–4.500)

## 2019-04-28 LAB — LACTATE DEHYDROGENASE: LDH: 268 U/L — ABNORMAL HIGH (ref 98–192)

## 2019-04-28 LAB — FIBRINOGEN: Fibrinogen: 395 mg/dL (ref 210–475)

## 2019-04-28 MED ORDER — METHOTREXATE 2.5 MG PO TABS: 10.0000 mg | ORAL_TABLET | ORAL | Status: DC

## 2019-04-28 MED ORDER — FOLIC ACID 1 MG PO TABS
2.0000 mg | ORAL_TABLET | Freq: Every day | ORAL | Status: DC
  Administered 2019-04-28: 2 mg via ORAL
  Administered 2019-04-29 – 2019-04-30 (×2): 1 mg via ORAL
  Administered 2019-05-01 – 2019-05-03 (×3): 2 mg via ORAL
  Filled 2019-04-28 (×6): qty 2

## 2019-04-28 MED ORDER — VITAMIN D 25 MCG (1000 UNIT) PO TABS
2000.0000 [IU] | ORAL_TABLET | Freq: Every day | ORAL | Status: DC
  Administered 2019-04-28 – 2019-05-03 (×6): 2000 [IU] via ORAL
  Filled 2019-04-28 (×6): qty 2

## 2019-04-28 MED ORDER — LEVOTHYROXINE SODIUM 88 MCG PO TABS
88.0000 ug | ORAL_TABLET | Freq: Every day | ORAL | Status: DC
  Administered 2019-04-28 – 2019-05-03 (×6): 88 ug via ORAL
  Filled 2019-04-28 (×6): qty 1

## 2019-04-28 MED ORDER — VITAMIN C 500 MG PO TABS
500.0000 mg | ORAL_TABLET | Freq: Every day | ORAL | Status: DC
  Administered 2019-04-28 – 2019-05-03 (×6): 500 mg via ORAL
  Filled 2019-04-28 (×6): qty 1

## 2019-04-28 MED ORDER — ONDANSETRON HCL 4 MG/2ML IJ SOLN
4.0000 mg | Freq: Four times a day (QID) | INTRAMUSCULAR | Status: DC | PRN
  Administered 2019-04-28 – 2019-04-30 (×4): 4 mg via INTRAVENOUS
  Filled 2019-04-28 (×4): qty 2

## 2019-04-28 MED ORDER — AMLODIPINE BESYLATE 5 MG PO TABS
2.5000 mg | ORAL_TABLET | Freq: Every day | ORAL | Status: DC
  Administered 2019-04-28: 2.5 mg via ORAL
  Filled 2019-04-28: qty 1

## 2019-04-28 MED ORDER — ZINC SULFATE 220 (50 ZN) MG PO CAPS
220.0000 mg | ORAL_CAPSULE | Freq: Every day | ORAL | Status: DC
  Administered 2019-04-29 – 2019-05-03 (×5): 220 mg via ORAL
  Filled 2019-04-28 (×6): qty 1

## 2019-04-28 MED ORDER — HYDROCODONE-ACETAMINOPHEN 5-325 MG PO TABS
1.0000 | ORAL_TABLET | ORAL | Status: DC | PRN
  Administered 2019-04-29 – 2019-05-02 (×5): 1 via ORAL
  Filled 2019-04-28 (×6): qty 1

## 2019-04-28 MED ORDER — PREDNISONE 5 MG PO TABS
5.0000 mg | ORAL_TABLET | Freq: Every day | ORAL | Status: DC
  Administered 2019-04-28: 5 mg via ORAL
  Filled 2019-04-28: qty 1

## 2019-04-28 MED ORDER — HYDROMORPHONE HCL 1 MG/ML IJ SOLN
0.5000 mg | INTRAMUSCULAR | Status: DC | PRN
  Administered 2019-04-28: 0.5 mg via INTRAVENOUS
  Filled 2019-04-28 (×2): qty 1

## 2019-04-28 MED ORDER — CARVEDILOL 12.5 MG PO TABS
12.5000 mg | ORAL_TABLET | Freq: Two times a day (BID) | ORAL | Status: DC
  Administered 2019-04-28 – 2019-05-03 (×11): 12.5 mg via ORAL
  Filled 2019-04-28 (×11): qty 1

## 2019-04-28 MED ORDER — ACETAMINOPHEN 325 MG PO TABS
650.0000 mg | ORAL_TABLET | Freq: Four times a day (QID) | ORAL | Status: DC | PRN
  Administered 2019-04-28 – 2019-05-03 (×7): 650 mg via ORAL
  Filled 2019-04-28 (×8): qty 2

## 2019-04-28 MED ORDER — LEVETIRACETAM 500 MG PO TABS
500.0000 mg | ORAL_TABLET | Freq: Two times a day (BID) | ORAL | Status: DC
  Administered 2019-04-28 – 2019-05-03 (×12): 500 mg via ORAL
  Filled 2019-04-28 (×12): qty 1

## 2019-04-28 MED ORDER — ROSUVASTATIN CALCIUM 5 MG PO TABS
5.0000 mg | ORAL_TABLET | Freq: Every day | ORAL | Status: DC
  Administered 2019-04-28 – 2019-05-03 (×6): 5 mg via ORAL
  Filled 2019-04-28 (×6): qty 1

## 2019-04-28 MED ORDER — PREDNISONE 1 MG PO TABS
4.0000 mg | ORAL_TABLET | Freq: Every day | ORAL | Status: DC
  Administered 2019-04-29 – 2019-05-02 (×4): 4 mg via ORAL
  Filled 2019-04-28 (×4): qty 4

## 2019-04-28 MED ORDER — IOHEXOL 350 MG/ML SOLN
75.0000 mL | Freq: Once | INTRAVENOUS | Status: AC | PRN
  Administered 2019-04-28: 75 mL via INTRAVENOUS

## 2019-04-28 NOTE — ED Notes (Signed)
Dinner ordered 

## 2019-04-28 NOTE — Telephone Encounter (Signed)
Patient's wife who is on dpr advised of results and recommendations.

## 2019-04-28 NOTE — Progress Notes (Signed)
Echocardiogram 2D Echocardiogram has been performed.  [place] Deputy Liley Rake 04/28/2019, 4:01 PM

## 2019-04-28 NOTE — Progress Notes (Addendum)
PROGRESS NOTE   Eric [name]  [id number]    DOB: 1945-12-18    DOA: 04/27/2019  PCP: Colon Branch, MD   I have briefly reviewed patients previous medical records in [facility].  Chief Complaint  Patient presents with   Fall    Brief Narrative:  73 year old married male with PMH of HTN, HLD, hypothyroidism, LBBB, aortic stenosis s/p bioprosthetic aortic valve replacement 12/17/2017, dizziness, both patient and spouse recently tested Covid positive, reportedly had a syncopal episode at home resulting in fall, hit to the head and subarachnoid hemorrhage.  Neurosurgery consulted.  Being managed conservatively/observation.   Assessment & Plan:   Active Problems:   Subarachnoid hemorrhage (HCC)   Subarachnoid hemorrhage/left orbitofrontal contusion/nondisplaced left occipital fracture  Likely related to syncope, fall and head injury.  Neurosurgery consultation appreciated and indicate that appearance is typical for traumatic etiology.  CTA head was performed and aneurysm was ruled out.  As per their follow-up, plan for repeat head CT later today and if stable cleared from neurosurgery perspective.  Continue Keppra x1 week for seizure prophylaxis.  Aspirin on hold.  Neurologically intact.  Syncope  Suspect due to dehydration and orthostatic hypotension.  On day of admission, BP dropped from 149/74 supine to 128/75 standing.  Patient reports that he was recently taken off of Aldactone and telmisartan  Telemetry shows sinus rhythm with known BBB morphology and no arrhythmias.  TTE 12/2017: LVEF 50-55%.  Repeat TTE.  Patient has been counseled that he should not drive for 6 months and he verbalized understanding.  Acute kidney injury  Creatinine had peaked to 1.37 on 11/16.  Likely related to diuretics, ARB and dehydration.  Resolved.  Encouraged oral fluid intake.  COVID-19 infection  Asymptomatic.  Not hypoxic.  Already on prednisone for rheumatoid arthritis,  continue.  Monitor closely.  Essential hypertension  Soft blood pressures.  Currently on amlodipine 2.5 mg daily and carvedilol 12.5 mg twice daily.  Temporarily hold amlodipine.  Continue carvedilol.  Aldactone and telmisartan recently stopped by PCP.  Hypothyroidism  Clinically euthyroid.  TSH 1.391.  Continue Synthroid.  Hyperlipidemia  Continue statins.  Rheumatoid arthritis, seronegative  No acute flare.  Continue prior home dose of prednisone 4 mg daily.  Patient was advised by his Rheumatologist to hold methotrexate at this time, will hold.  Aortic stenosis, s/p bioprosthetic AV.  Uncomplicated right inguinal hernia  CT abdomen and pelvis with contrast 11/16 shows slightly enlarging fat-containing right inguinal hernia with portion of the right anterolateral bladder protruding through the hernial aperture.  Outpatient follow-up and surgical consultation as deemed necessary.  Bilobed fusiform infrarenal abdominal aortic ectasia  Noted on CT abdomen measuring 2.4 x 2.7 cm in diameter, at risk for aneurysmal development.  Recommend follow-up by ultrasound in 5 years per ACR consensus guidelines.  8 mm groundglass nodule in right lower lobe  Noted on CT abdomen.  Recommend follow-up with CT in 6 to 12 months to confirm persistence and then follow-up as per radiology recommendations.   DVT prophylaxis: SCD Code Status: Full Family Communication: None at bedside Disposition: DC home pending clinical improvement.   Consultants:  Neurosurgeon  Procedures:  None  Antimicrobials:  None   Subjective: Patient seen this morning in the ED.  Denies complaints at this time.  Specifically denies headache.  He reports ongoing dizziness for last month or 2, in upright position.  No prior episodes of passing out.  Does not recollect passing out even during this episode but cannot recollect  how he hit back of his head.  States that his PCP recently took him off of 2  medicines during televisit.  Objective:  Vitals:   04/28/19 0749 04/28/19 0810 04/28/19 0830 04/28/19 0900  BP: 106/70  110/65 105/60  Pulse: 85 84 80 74  Resp: (!) 26 (!) 23 (!) 24 (!) 25  Temp:  97.9 F (36.6 C)    TempSrc:  Oral    SpO2: 94% 96% 95% 95%  Weight: 86.2 kg     Height: 5\' 6"  (1.676 m)       Examination:  General exam: Pleasant elderly male, moderately built and nourished lying comfortably propped up in bed without distress.  Oral mucosa with borderline hydration. Respiratory system: Clear to auscultation. Respiratory effort normal. Cardiovascular system: S1 & S2 heard, RRR. No JVD, murmurs, rubs, gallops or clicks. No pedal edema.  Telemetry personally reviewed: Sinus rhythm with BBB morphology. Gastrointestinal system: Abdomen is nondistended, soft and nontender. No organomegaly or masses felt. Normal bowel sounds heard. Central nervous system: Alert and oriented. No focal neurological deficits. Extremities: Symmetric 5 x 5 power. Skin: No rashes, lesions or ulcers Psychiatry: Judgement and insight appear normal. Mood & affect appropriate.     Data Reviewed: I have personally reviewed following labs and imaging studies   CBC: Recent Labs  Lab 04/26/19 1138 04/27/19 2114  WBC 5.3 5.2  NEUTROABS 4.0 3.8  HGB 13.3 13.2  HCT 39.9 39.8  MCV 103.1* 101.3*  PLT 186 [id number]    Basic Metabolic Panel: Recent Labs  Lab 04/26/19 1138 04/27/19 2114 04/28/19 0127 04/28/19 0427  NA 137 135  --  136  K 4.7 4.1  --  4.1  CL 107 101  --  103  CO2 20* 24  --  21*  GLUCOSE 113* 100*  --  129*  BUN 21 19  --  14  CREATININE 1.37* 1.28*  --  1.15  CALCIUM 8.9 9.0  --  8.6*  MG  --   --  1.9  --   PHOS  --   --  3.0  --     Liver Function Tests: Recent Labs  Lab 04/26/19 1146 04/27/19 2114 04/28/19 0427  AST 33 33 29  ALT 25 26 22   ALKPHOS 56 53 50  BILITOT 1.3* 1.2 1.0  PROT 6.6 6.9 6.2*  ALBUMIN 3.8 3.9 3.5    CBG: No results for input(s):  GLUCAP in the last 168 hours.  Recent Results (from the past 240 hour(s))  Novel Coronavirus, NAA ([facility])     Status: Abnormal   Collection Time: 04/23/19 12:00 AM   Specimen: Nasopharyngeal(NP) swabs in vial transport medium   NASOPHARYNGE  TESTING  Result Value Ref Range Status   SARS-CoV-2, NAA Detected (A) Not Detected Final    Comment: Testing was performed using the cobas(R) SARS-CoV-2 test. This nucleic acid amplification test was developed and its performance characteristics determined by [employer]. Nucleic acid amplification tests include PCR and TMA. This test has not been FDA cleared or approved. This test has been authorized by FDA under an Emergency Use Authorization (EUA). This test is only authorized for the duration of time the declaration that circumstances exist justifying the authorization of the emergency use of in vitro diagnostic tests for detection of SARS-CoV-2 virus and/or diagnosis of COVID-19 infection under section 564(b)(1) of the Act, 21 U.S.C. [id number]) (1), unless the authorization is terminated or revoked sooner. When diagnostic testing is negative, the possibility of a false  negative result should be considered in the context of a patient's recent exposures and the presence of clinical signs and symptoms consistent with COVID-19. An individual without symptoms  of COVID-19 and who is not shedding SARS-CoV-2 virus would expect to have a negative (not detected) result in this assay.       Radiology Studies: Ct Angio Head W Or Wo Contrast  Result Date: 04/28/2019 CLINICAL DATA:  Traumatic subarachnoid hemorrhage and skull fracture. EXAM: CT ANGIOGRAPHY HEAD TECHNIQUE: Multidetector CT imaging of the head was performed using the standard protocol during bolus administration of intravenous contrast. Multiplanar CT image reconstructions and MIPs were obtained to evaluate the vascular anatomy. CONTRAST:  39mL OMNIPAQUE IOHEXOL 350 MG/ML  SOLN COMPARISON:  Head CT from yesterday FINDINGS: CTA HEAD Anterior circulation: Vessels are smooth and widely patent. No evidence of dissection at the skull base or upper neck. Negative for aneurysm or vascular malformation. No significant atheromatous changes or stenosis. The ICAs are tortuous in the neck. Posterior circulation: Left dominant vertebral artery. No evidence of dissection and no thrombosis or aneurysm. Venous sinuses: Limited assessment due to contrast timing with no evidence of thrombosis, including the left transverse sinus which is crossed by a fracture. Anatomic variants: None significant. A noncontrast phase was not repeated given the recent noncontrast scan. No evidence of progressive subarachnoid hemorrhage or inferior frontal contusion. IMPRESSION: No evidence of major vessel injury.  Negative for cerebral aneurysm. Electronically Signed   By: Monte Fantasia M.D.   On: 04/28/2019 05:54   Ct Head Wo Contrast  Result Date: 04/27/2019 CLINICAL DATA:  Fall, hematoma on back of head. EXAM: CT HEAD WITHOUT CONTRAST CT CERVICAL SPINE WITHOUT CONTRAST TECHNIQUE: Multidetector CT imaging of the head and cervical spine was performed following the standard protocol without intravenous contrast. Multiplanar CT image reconstructions of the cervical spine were also generated. COMPARISON:  None. FINDINGS: CT HEAD FINDINGS Brain: there is subarachnoid blood noted over both frontal lobes, along the anterior falx and in the both sylvian fissures. Intracerebral contusion noted within the inferior anterior left frontal lobe measuring 2.1 x 1.1 cm. No midline shift. No hydrocephalus. Vascular: No hyperdense vessel or unexpected calcification. Skull: There is a nondisplaced left occipital bone fracture. Sinuses/Orbits: No acute finding. Other: Soft tissue swelling in the left posterior scalp. CT CERVICAL SPINE FINDINGS Alignment: No subluxation Skull base and vertebrae: No acute fracture. No primary bone  lesion or focal pathologic process. Soft tissues and spinal canal: No prevertebral fluid or swelling. No visible canal hematoma. Disc levels:  Diffuse degenerative disc and facet disease. Upper chest: No acute findings Other: None IMPRESSION: Subarachnoid hemorrhage overlying the frontal lobes, within the sylvian fissures, and along the anterior falx. 2.1 x 1.1 cm intracerebral hemorrhage/contusion in the inferior left frontal lobe. No midline shift. Nondisplaced left occipital bone fracture Critical Value/emergent results were called by telephone at the time of interpretation on 04/27/2019 at 11:38 pm to providerDr. Leonette Monarch, Who verbally acknowledged these results. Electronically Signed   By: Rolm Baptise M.D.   On: 04/27/2019 23:47   Ct Cervical Spine Wo Contrast  Result Date: 04/27/2019 CLINICAL DATA:  Fall, hematoma on back of head. EXAM: CT HEAD WITHOUT CONTRAST CT CERVICAL SPINE WITHOUT CONTRAST TECHNIQUE: Multidetector CT imaging of the head and cervical spine was performed following the standard protocol without intravenous contrast. Multiplanar CT image reconstructions of the cervical spine were also generated. COMPARISON:  None. FINDINGS: CT HEAD FINDINGS Brain: there is subarachnoid blood noted over both frontal lobes,  along the anterior falx and in the both sylvian fissures. Intracerebral contusion noted within the inferior anterior left frontal lobe measuring 2.1 x 1.1 cm. No midline shift. No hydrocephalus. Vascular: No hyperdense vessel or unexpected calcification. Skull: There is a nondisplaced left occipital bone fracture. Sinuses/Orbits: No acute finding. Other: Soft tissue swelling in the left posterior scalp. CT CERVICAL SPINE FINDINGS Alignment: No subluxation Skull base and vertebrae: No acute fracture. No primary bone lesion or focal pathologic process. Soft tissues and spinal canal: No prevertebral fluid or swelling. No visible canal hematoma. Disc levels:  Diffuse degenerative disc and  facet disease. Upper chest: No acute findings Other: None IMPRESSION: Subarachnoid hemorrhage overlying the frontal lobes, within the sylvian fissures, and along the anterior falx. 2.1 x 1.1 cm intracerebral hemorrhage/contusion in the inferior left frontal lobe. No midline shift. Nondisplaced left occipital bone fracture Critical Value/emergent results were called by telephone at the time of interpretation on 04/27/2019 at 11:38 pm to providerDr. Leonette Monarch, Who verbally acknowledged these results. Electronically Signed   By: Rolm Baptise M.D.   On: 04/27/2019 23:47   Ct Abdomen Pelvis W Contrast  Result Date: 04/26/2019 CLINICAL DATA:  Abdominal distension, lightheadedness with fall EXAM: CT ABDOMEN AND PELVIS WITH CONTRAST TECHNIQUE: Multidetector CT imaging of the abdomen and pelvis was performed using the standard protocol following bolus administration of intravenous contrast. CONTRAST:  173mL OMNIPAQUE IOHEXOL 300 MG/ML  SOLN COMPARISON:  CTA November 10, 2017 FINDINGS: Lower chest: Basilar areas of atelectasis and/or scarring. 8 mm ground-glass nodule present in the right lower lobe. Normal heart size. Aortic valve replacement is noted. Coronary artery calcifications are present. No pericardial effusion. Hepatobiliary: No focal liver abnormality is seen. Patient is post cholecystectomy. Slight prominence of the biliary tree likely related to reservoir effect. No calcified intraductal gallstones. Pancreas: Unremarkable. No pancreatic ductal dilatation or surrounding inflammatory changes. Spleen: Normal in size without focal abnormality. Adrenals/Urinary Tract: Adrenal glands are normal. Mild bilateral symmetric perinephric stranding, a nonspecific finding though may correlate with either age or decreased renal function. Subcentimeter hypoattenuating focus in the upper pole left kidney is too small to fully characterize on CT imaging but statistically likely benign. The kidneys are otherwise unremarkable, without  renal calculi, suspicious lesion, or hydronephrosis. No extravasation of contrast is seen on excretory phase delayed imaging. Portion of the right anterolateral bladder protrudes into the right fat containing hernia. No resulting obstruction. Stomach/Bowel: Distal esophagus, stomach and duodenal sweep are unremarkable. No small bowel wall thickening or dilatation. No evidence of obstruction. A normal appendix is visualized. No colonic dilatation or wall thickening. Scattered colonic diverticula without focal pericolonic inflammation to suggest diverticulitis. Vascular/Lymphatic: There dominantly aorta is tortuous and calcified with bilobed fusiform ectatic segments. The first arising just below the level of the renal arteries measures up to 2.4 cm in diameter with some eccentric mural thrombus. The second meant just proximal to the bifurcation measures up to 2.7 cm. No extension into the iliac arteries. No proximal occlusions. No suspicious or enlarged lymph nodes in the included lymphatic chains. Reproductive: Coarse eccentric calcification of the prostate. No concerning abnormalities of the prostate or seminal vesicles. Other: Enlarging fat containing right inguinal hernia with portion the right anterolateral bladder protruding through the hernia aperture. Smaller fat containing left inguinal hernia and tiny umbilical hernia are present as well. No bowel containing hernias or resulting obstruction. No free air or fluid in the abdomen or pelvis. Musculoskeletal: Multilevel degenerative changes are present in the imaged portions of the  spine. No acute osseous abnormality or suspicious osseous lesion. IMPRESSION: 1. No acute intra-abdominal process. Few air-filled loops of bowel without frank distention or obstruction. 2. Slightly enlarging fat containing right inguinal hernia with portion of the right anterolateral bladder protruding through the hernia aperture. No bowel containing hernias. 3. Bilobed fusiform  infrarenal abdominal aortic ectasia measuring with lobulations measuring up to 2.4 cm and 2.7 cm in diameter, not significantly changed from prior study. Ectatic abdominal aorta at risk for aneurysm development. Recommend followup by ultrasound in 5 years. This recommendation follows ACR consensus guidelines: White Paper of the ACR Incidental Findings Committee II on Vascular Findings. J Am Coll Radiol 2013; 10:789-794. Aortic aneurysm NOS (ICD10-I71.9) 4. 8 mm ground-glass nodule in the right lower lobe. Initial follow-up with CT at 6-12 months is recommended to confirm persistence. If persistent, repeat CT is recommended every 2 years until 5 years of stability has been established. This recommendation follows the consensus statement: Guidelines for Management of Incidental Pulmonary Nodules Detected on CT Images: From the Fleischner Society 2017; Radiology 2017; 284:228-243. 5. Aortic Atherosclerosis (ICD10-I70.0). 6. Prior aortic valve replacement. Electronically Signed   By: Lovena Le M.D.   On: 04/26/2019 19:29   Dg Pelvis Portable  Result Date: 04/27/2019 CLINICAL DATA:  73 year old male status post unwitnessed fall. Recently tested positive for COVID-19. EXAM: PORTABLE PELVIS 1-2 VIEWS COMPARISON:  CT Abdomen and Pelvis 04/26/2019. FINDINGS: Portable AP supine view at 2132 hours. Femoral heads remain normally located. Pelvis appears stable and intact. Grossly intact proximal femurs. Bone mineralization is within normal limits. Negative visible lower abdominal and pelvic visceral contours. IMPRESSION: No acute fracture or dislocation identified about the pelvis. Electronically Signed   By: Genevie Ann M.D.   On: 04/27/2019 21:50   Dg Chest Port 1 View  Result Date: 04/27/2019 CLINICAL DATA:  73 year old male status post unwitnessed fall. Recently tested positive for COVID-19. EXAM: PORTABLE CHEST 1 VIEW COMPARISON:  Portable chest 04/26/2019 and earlier. FINDINGS: Portable AP upright view at 2129  hours. Lung volumes and mediastinal contours are stable and within normal limits. Prosthetic cardiac valve. Previous right anterior rib ORIF again noted. Allowing for portable technique the lungs are clear. Visualized tracheal air column is within normal limits. Stable visualized osseous structures. Stable cholecystectomy clips. Negative visible bowel gas pattern. IMPRESSION: No acute cardiopulmonary abnormality or acute traumatic injury identified. Electronically Signed   By: Genevie Ann M.D.   On: 04/27/2019 21:49          Scheduled Meds:  amLODipine  2.5 mg Oral Daily   carvedilol  12.5 mg Oral BID WC   cholecalciferol  2,000 Units Oral Daily   folic acid  2 mg Oral Daily   levETIRAcetam  500 mg Oral BID   levothyroxine  88 mcg Oral QAC breakfast   predniSONE  5 mg Oral Q breakfast   rosuvastatin  5 mg Oral Daily   vitamin C  500 mg Oral Daily   zinc sulfate  220 mg Oral Daily   Continuous Infusions:   LOS: 0 days     Vernell Leep, MD, [place], [facility]. [facility]  To contact the attending provider between 7A-7P or the covering provider during after hours 7P-7A, please log into the web site [email or web address] and access using universal [place] password for that web site. If you do not have the password, please call the hospital operator.  04/28/2019, 2:23 PM

## 2019-04-28 NOTE — H&P (Addendum)
History and Physical  Eric [name] [id number] DOB: 1945/06/13 DOA: 04/27/2019  Referring physician: ER provider PCP: Colon Branch, MD  Outpatient Specialists:    Patient coming from: Home  Chief Complaint: Subarachnoid bleed  HPI:  Patient is a 73 year old male with past medical history significant for hypertension, hyperlipidemia, aortic stenosis status post bioprosthetic AVR, dizzines, s left bundle branch block and abnormal LFTs.  Patient and patient's wife tested positive for Covid last week Friday, 04/23/2019.  According to the patient, he decided to test himself for Covid because of his 8 grandchildren (he wanted to be sure it was safe for the grandchildren to be around him).  Patient is unable to give significant history.  Collateral information revealed that patient may have had a syncopal episode at home resulting in a fall and subarachnoid bleed.  Patient could not remember details leading to the syncope.  ER provider seem to think the syncope was secondary to volume depletion.  Patient reports headache.  No fever chills, no chest pain, no shortness of breath, no palpitations, no URI symptoms, no GI symptoms or urinary symptoms.  CT head result is as documented below.  Patient be admitted for further assessment and management.  ED Course: On presentation to the ED, temperature was 99.8, blood pressure was 146/76, heart rate of 79 with respiratory rate of 25.  O2 sats is 96% on room air.  Patient has been seen by the neurosurgery team.  Neurosurgery has advised repeat CT head to rule out aneurys, Keppra 500 mg p.o. twice daily for 7 days, and for aspirin to be held.  Pertinent labs: Chemistry reveals sodium of 135, potassium of 4.1, chloride 101, CO2 24, BUN of 19 and creatinine of 1.3 with blood sugar of 100.  High-sensitivity troponin is 9.  CBC reveals WBC of 5.2, hemoglobin of 13.2, hematocrit of 39.8, MCV of 101.3 with platelet count of 219.  EKG: Independently reviewed.    Imaging: independently reviewed.  CT head reveals: "Subarachnoid hemorrhage overlying the frontal lobes, within the sylvian fissures, and along the anterior falx. 2.1 x 1.1 cm intracerebral hemorrhage/contusion in the inferior left frontal lobe. No midline shift.  Nondisplaced left occipital bone fracture"   Review of Systems: Patient is a poor historian.  Negative for fever, visual changes, sore throat, rash, new muscle aches, chest pain, SOB, dysuria, bleeding, n/v/abdominal pain.  Past Medical History:  Diagnosis Date  . Abnormal LFTs   . Anxiety   . Aortic stenosis 04/15/2011  . Dizziness 04/21/2014  . Dyspnea   . Heart murmur   . HNP (herniated nucleus pulposus), lumbar    L5-S1  . Hyperglycemia 10/20/2014  . Hyperlipidemia   . Hypertension   . Hypothyroidism   . Left bundle branch block (LBBB) 12/18/2017  . Lumbar stenosis   . Macular edema    Dr  Sherlynn Stalls   . 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 AORTIC VALVE REPLACEMENT N/A 12/17/2017   Procedure: MINIMALLY INVASIVE AORTIC VALVE REPLACEMENT (AVR) [ using Edwards Intuity Valve Size 35mm;  Surgeon: Rexene Alberts, MD;  Location: [place];  Service: Open Heart Surgery;  Laterality: N/A;  MINI THORACOTOMY  . CARDIAC CATHETERIZATION  11/10/00  . CATARACT EXTRACTION W/ INTRAOCULAR LENS IMPLANT     RIGHT EYE  . CHOLECYSTECTOMY    .  EYE SURGERY     Right eye cataract  . INGUINAL HERNIA REPAIR     (B)  . LUMBAR LAMINECTOMY/DECOMPRESSION MICRODISCECTOMY N/A 04/23/2018   Procedure: Microlumbar decompression Lumbar five-Sacral one;  Surgeon: Susa Day, MD;  Location: [place];  Service: Orthopedics;  Laterality: N/A;  . POLYPECTOMY    . RIGHT/LEFT HEART CATH AND CORONARY ANGIOGRAPHY N/A 10/16/2017    Procedure: RIGHT/LEFT HEART CATH AND CORONARY ANGIOGRAPHY;  Surgeon: [name], MD;  Location: [place] [facility];  Service: Cardiovascular;  Laterality: N/A;  . TEE WITHOUT CARDIOVERSION N/A 12/17/2017   Procedure: TRANSESOPHAGEAL ECHOCARDIOGRAM (TEE);  Surgeon: Rexene Alberts, MD;  Location: [place];  Service: Open Heart Surgery;  Laterality: N/A;  . TONSILLECTOMY       reports that he quit smoking about 29 years ago. He has never used smokeless tobacco. He reports previous alcohol use. He reports that he does not use drugs.  Allergies  Allergen Reactions  . Atorvastatin Other (See Comments)    REACTION: myalgia  . Ezetimibe Other (See Comments)    REACTION: myalgia    Family History  Problem Relation Age of Onset  . Diabetes Mother   . Stroke Mother   . Diabetes Brother        ?  . Coronary artery disease Brother        Male 1st degree relative >50, had CABG in his 48s  . Emphysema Father   . COPD Father   . Diabetes Daughter   . Colon cancer Neg Hx   . Prostate cancer Neg Hx   . Colon polyps Neg Hx   . Esophageal cancer Neg Hx   . Rectal cancer Neg Hx   . Stomach cancer Neg Hx      Prior to Admission medications   Medication Sig Start Date End Date Taking? Authorizing Provider  amLODipine (NORVASC) 5 MG tablet Take 0.5 tablets (2.5 mg total) by mouth daily. 11/17/18   Colon Branch, MD  aspirin EC 81 MG tablet Take 81 mg by mouth daily.    [provider]  carvedilol (COREG) 12.5 MG tablet TAKE 1 TABLET (12.5 MG TOTAL) BY MOUTH 2 (TWO) TIMES DAILY WITH A MEAL. 04/13/19   [name], MD  Cholecalciferol (VITAMIN D-3 PO) Take 2,000 Units by mouth daily.    [provider]  folic acid (FOLVITE) 1 MG tablet Take 2 tablets (2 mg total) by mouth daily. 09/25/18   [name], MD  levothyroxine (SYNTHROID) 88 MCG tablet Take 1 tablet (88 mcg total) by mouth daily before breakfast. 12/14/18   Colon Branch, MD  methotrexate (RHEUMATREX) 2.5 MG tablet  TAKE 4 TABLETS BY MOUTH ONE TIME PER WEEK 04/05/19   [name], MD  predniSONE (DELTASONE) 5 MG tablet Take 1 tablet (5 mg total) by mouth daily with breakfast. 02/22/19   [name], MD  rosuvastatin (CRESTOR) 5 MG tablet Take 1 tablet (5 mg total) by mouth daily. 04/20/19   Colon Branch, MD  spironolactone (ALDACTONE) 25 MG tablet TAKE 1 TABLET BY MOUTH EVERY DAY 12/30/18   [name], MD  telmisartan (MICARDIS) 40 MG tablet TAKE ONE TABLET (40 MG TOTAL) BY MOUTH DAILY in A.M. AND ONE TABLET (40 MG TOTAL) BY MOUTH IN AFTERNOON ON Monday & Thursday also. 02/17/19   Loel Dubonnet, NP    Physical Exam: Vitals:   04/27/19 2016 04/27/19 2022 04/27/19 2030 04/27/19 2100  BP: 137/78   (!) 146/76  Pulse:  83 81 82 79  Resp: 18 (!) 22 17 (!) 25  Temp: 99.8 F (37.7 C)     TempSrc: Oral Oral    SpO2: 98% 98% 96% 96%   Constitutional:  . Appears calm and comfortable Eyes:  . No pallor. No jaundice.  ENMT:  . external ears, nose appear normal Neck:  . Neck is supple. No JVD Respiratory:  . CTA bilaterally, no w/r/r.  . Respiratory effort normal. No retractions or accessory muscle use Cardiovascular:  . S1S2 . No LE extremity edema   Abdomen:  . Abdomen is soft and non tender. Organs are difficult to assess. Neurologic:  . Awake and alert. . Moves all limbs.  Wt Readings from Last 3 Encounters:  04/26/19 86.2 kg  04/06/19 89.4 kg  03/29/19 88.5 kg    I have personally reviewed following labs and imaging studies  Labs on Admission:  CBC: Recent Labs  Lab 04/26/19 1138 04/27/19 2114  WBC 5.3 5.2  NEUTROABS 4.0 3.8  HGB 13.3 13.2  HCT 39.9 39.8  MCV 103.1* 101.3*  PLT 186 [id number]   Basic Metabolic Panel: Recent Labs  Lab 04/26/19 1138 04/27/19 2114  NA 137 135  K 4.7 4.1  CL 107 101  CO2 20* 24  GLUCOSE 113* 100*  BUN 21 19  CREATININE 1.37* 1.28*  CALCIUM 8.9 9.0   Liver Function Tests: Recent Labs  Lab 04/26/19 1146 04/27/19 2114  AST  33 33  ALT 25 26  ALKPHOS 56 53  BILITOT 1.3* 1.2  PROT 6.6 6.9  ALBUMIN 3.8 3.9   Recent Labs  Lab 04/26/19 1146  LIPASE 31   No results for input(s): AMMONIA in the last 168 hours. Coagulation Profile: No results for input(s): INR, PROTIME in the last 168 hours. Cardiac Enzymes: No results for input(s): CKTOTAL, CKMB, CKMBINDEX, TROPONINI in the last 168 hours. BNP (last 3 results) No results for input(s): PROBNP in the last 8760 hours. HbA1C: No results for input(s): HGBA1C in the last 72 hours. CBG: No results for input(s): GLUCAP in the last 168 hours. Lipid Profile: No results for input(s): CHOL, HDL, LDLCALC, TRIG, CHOLHDL, LDLDIRECT in the last 72 hours. Thyroid Function Tests: No results for input(s): TSH, T4TOTAL, FREET4, T3FREE, THYROIDAB in the last 72 hours. Anemia Panel: No results for input(s): VITAMINB12, FOLATE, FERRITIN, TIBC, IRON, RETICCTPCT in the last 72 hours. Urine analysis:    Component Value Date/Time   COLORURINE YELLOW 12/30/2017 1608   APPEARANCEUR Clear 02/24/2018 1140   LABSPEC 1.009 12/30/2017 1608   PHURINE 5.0 12/30/2017 1608   GLUCOSEU Negative 02/24/2018 1140   HGBUR NEGATIVE 12/30/2017 1608   BILIRUBINUR Negative 02/24/2018 1140   KETONESUR NEGATIVE 12/30/2017 1608   PROTEINUR Negative 02/24/2018 1140   PROTEINUR NEGATIVE 12/30/2017 1608   NITRITE Negative 02/24/2018 1140   NITRITE NEGATIVE 12/30/2017 1608   LEUKOCYTESUR Negative 02/24/2018 1140   Sepsis Labs: @LABRCNTIP (procalcitonin:4,lacticidven:4) ) Recent Results (from the past 240 hour(s))  Novel Coronavirus, NAA ([facility])     Status: Abnormal   Collection Time: 04/23/19 12:00 AM   Specimen: Nasopharyngeal(NP) swabs in vial transport medium   NASOPHARYNGE  TESTING  Result Value Ref Range Status   SARS-CoV-2, NAA Detected (A) Not Detected Final    Comment: Testing was performed using the cobas(R) SARS-CoV-2 test. This nucleic acid amplification test was developed and  its performance characteristics determined by [employer]. Nucleic acid amplification tests include PCR and TMA. This test has not been FDA cleared or approved.  This test has been authorized by FDA under an Emergency Use Authorization (EUA). This test is only authorized for the duration of time the declaration that circumstances exist justifying the authorization of the emergency use of in vitro diagnostic tests for detection of SARS-CoV-2 virus and/or diagnosis of COVID-19 infection under section 564(b)(1) of the Act, 21 U.S.C. [id number]) (1), unless the authorization is terminated or revoked sooner. When diagnostic testing is negative, the possibility of a false negative result should be considered in the context of a patient's recent exposures and the presence of clinical signs and symptoms consistent with COVID-19. An individual without symptoms  of COVID-19 and who is not shedding SARS-CoV-2 virus would expect to have a negative (not detected) result in this assay.       Radiological Exams on Admission: Ct Head Wo Contrast  Result Date: 04/27/2019 CLINICAL DATA:  Fall, hematoma on back of head. EXAM: CT HEAD WITHOUT CONTRAST CT CERVICAL SPINE WITHOUT CONTRAST TECHNIQUE: Multidetector CT imaging of the head and cervical spine was performed following the standard protocol without intravenous contrast. Multiplanar CT image reconstructions of the cervical spine were also generated. COMPARISON:  None. FINDINGS: CT HEAD FINDINGS Brain: there is subarachnoid blood noted over both frontal lobes, along the anterior falx and in the both sylvian fissures. Intracerebral contusion noted within the inferior anterior left frontal lobe measuring 2.1 x 1.1 cm. No midline shift. No hydrocephalus. Vascular: No hyperdense vessel or unexpected calcification. Skull: There is a nondisplaced left occipital bone fracture. Sinuses/Orbits: No acute finding. Other: Soft tissue swelling in the left  posterior scalp. CT CERVICAL SPINE FINDINGS Alignment: No subluxation Skull base and vertebrae: No acute fracture. No primary bone lesion or focal pathologic process. Soft tissues and spinal canal: No prevertebral fluid or swelling. No visible canal hematoma. Disc levels:  Diffuse degenerative disc and facet disease. Upper chest: No acute findings Other: None IMPRESSION: Subarachnoid hemorrhage overlying the frontal lobes, within the sylvian fissures, and along the anterior falx. 2.1 x 1.1 cm intracerebral hemorrhage/contusion in the inferior left frontal lobe. No midline shift. Nondisplaced left occipital bone fracture Critical Value/emergent results were called by telephone at the time of interpretation on 04/27/2019 at 11:38 pm to providerDr. Leonette Monarch, Who verbally acknowledged these results. Electronically Signed   By: Rolm Baptise M.D.   On: 04/27/2019 23:47   Ct Cervical Spine Wo Contrast  Result Date: 04/27/2019 CLINICAL DATA:  Fall, hematoma on back of head. EXAM: CT HEAD WITHOUT CONTRAST CT CERVICAL SPINE WITHOUT CONTRAST TECHNIQUE: Multidetector CT imaging of the head and cervical spine was performed following the standard protocol without intravenous contrast. Multiplanar CT image reconstructions of the cervical spine were also generated. COMPARISON:  None. FINDINGS: CT HEAD FINDINGS Brain: there is subarachnoid blood noted over both frontal lobes, along the anterior falx and in the both sylvian fissures. Intracerebral contusion noted within the inferior anterior left frontal lobe measuring 2.1 x 1.1 cm. No midline shift. No hydrocephalus. Vascular: No hyperdense vessel or unexpected calcification. Skull: There is a nondisplaced left occipital bone fracture. Sinuses/Orbits: No acute finding. Other: Soft tissue swelling in the left posterior scalp. CT CERVICAL SPINE FINDINGS Alignment: No subluxation Skull base and vertebrae: No acute fracture. No primary bone lesion or focal pathologic process. Soft  tissues and spinal canal: No prevertebral fluid or swelling. No visible canal hematoma. Disc levels:  Diffuse degenerative disc and facet disease. Upper chest: No acute findings Other: None IMPRESSION: Subarachnoid hemorrhage overlying the frontal lobes, within the sylvian fissures, and  along the anterior falx. 2.1 x 1.1 cm intracerebral hemorrhage/contusion in the inferior left frontal lobe. No midline shift. Nondisplaced left occipital bone fracture Critical Value/emergent results were called by telephone at the time of interpretation on 04/27/2019 at 11:38 pm to providerDr. Leonette Monarch, Who verbally acknowledged these results. Electronically Signed   By: Rolm Baptise M.D.   On: 04/27/2019 23:47   Ct Abdomen Pelvis W Contrast  Result Date: 04/26/2019 CLINICAL DATA:  Abdominal distension, lightheadedness with fall EXAM: CT ABDOMEN AND PELVIS WITH CONTRAST TECHNIQUE: Multidetector CT imaging of the abdomen and pelvis was performed using the standard protocol following bolus administration of intravenous contrast. CONTRAST:  197mL OMNIPAQUE IOHEXOL 300 MG/ML  SOLN COMPARISON:  CTA November 10, 2017 FINDINGS: Lower chest: Basilar areas of atelectasis and/or scarring. 8 mm ground-glass nodule present in the right lower lobe. Normal heart size. Aortic valve replacement is noted. Coronary artery calcifications are present. No pericardial effusion. Hepatobiliary: No focal liver abnormality is seen. Patient is post cholecystectomy. Slight prominence of the biliary tree likely related to reservoir effect. No calcified intraductal gallstones. Pancreas: Unremarkable. No pancreatic ductal dilatation or surrounding inflammatory changes. Spleen: Normal in size without focal abnormality. Adrenals/Urinary Tract: Adrenal glands are normal. Mild bilateral symmetric perinephric stranding, a nonspecific finding though may correlate with either age or decreased renal function. Subcentimeter hypoattenuating focus in the upper pole left  kidney is too small to fully characterize on CT imaging but statistically likely benign. The kidneys are otherwise unremarkable, without renal calculi, suspicious lesion, or hydronephrosis. No extravasation of contrast is seen on excretory phase delayed imaging. Portion of the right anterolateral bladder protrudes into the right fat containing hernia. No resulting obstruction. Stomach/Bowel: Distal esophagus, stomach and duodenal sweep are unremarkable. No small bowel wall thickening or dilatation. No evidence of obstruction. A normal appendix is visualized. No colonic dilatation or wall thickening. Scattered colonic diverticula without focal pericolonic inflammation to suggest diverticulitis. Vascular/Lymphatic: There dominantly aorta is tortuous and calcified with bilobed fusiform ectatic segments. The first arising just below the level of the renal arteries measures up to 2.4 cm in diameter with some eccentric mural thrombus. The second meant just proximal to the bifurcation measures up to 2.7 cm. No extension into the iliac arteries. No proximal occlusions. No suspicious or enlarged lymph nodes in the included lymphatic chains. Reproductive: Coarse eccentric calcification of the prostate. No concerning abnormalities of the prostate or seminal vesicles. Other: Enlarging fat containing right inguinal hernia with portion the right anterolateral bladder protruding through the hernia aperture. Smaller fat containing left inguinal hernia and tiny umbilical hernia are present as well. No bowel containing hernias or resulting obstruction. No free air or fluid in the abdomen or pelvis. Musculoskeletal: Multilevel degenerative changes are present in the imaged portions of the spine. No acute osseous abnormality or suspicious osseous lesion. IMPRESSION: 1. No acute intra-abdominal process. Few air-filled loops of bowel without frank distention or obstruction. 2. Slightly enlarging fat containing right inguinal hernia with  portion of the right anterolateral bladder protruding through the hernia aperture. No bowel containing hernias. 3. Bilobed fusiform infrarenal abdominal aortic ectasia measuring with lobulations measuring up to 2.4 cm and 2.7 cm in diameter, not significantly changed from prior study. Ectatic abdominal aorta at risk for aneurysm development. Recommend followup by ultrasound in 5 years. This recommendation follows ACR consensus guidelines: White Paper of the ACR Incidental Findings Committee II on Vascular Findings. J Am Coll Radiol 2013; 10:789-794. Aortic aneurysm NOS (ICD10-I71.9) 4. 8 mm ground-glass nodule  in the right lower lobe. Initial follow-up with CT at 6-12 months is recommended to confirm persistence. If persistent, repeat CT is recommended every 2 years until 5 years of stability has been established. This recommendation follows the consensus statement: Guidelines for Management of Incidental Pulmonary Nodules Detected on CT Images: From the Fleischner Society 2017; Radiology 2017; 284:228-243. 5. Aortic Atherosclerosis (ICD10-I70.0). 6. Prior aortic valve replacement. Electronically Signed   By: Lovena Le M.D.   On: 04/26/2019 19:29   Dg Pelvis Portable  Result Date: 04/27/2019 CLINICAL DATA:  73 year old male status post unwitnessed fall. Recently tested positive for COVID-19. EXAM: PORTABLE PELVIS 1-2 VIEWS COMPARISON:  CT Abdomen and Pelvis 04/26/2019. FINDINGS: Portable AP supine view at 2132 hours. Femoral heads remain normally located. Pelvis appears stable and intact. Grossly intact proximal femurs. Bone mineralization is within normal limits. Negative visible lower abdominal and pelvic visceral contours. IMPRESSION: No acute fracture or dislocation identified about the pelvis. Electronically Signed   By: Genevie Ann M.D.   On: 04/27/2019 21:50   Dg Chest Port 1 View  Result Date: 04/27/2019 CLINICAL DATA:  73 year old male status post unwitnessed fall. Recently tested positive for  COVID-19. EXAM: PORTABLE CHEST 1 VIEW COMPARISON:  Portable chest 04/26/2019 and earlier. FINDINGS: Portable AP upright view at 2129 hours. Lung volumes and mediastinal contours are stable and within normal limits. Prosthetic cardiac valve. Previous right anterior rib ORIF again noted. Allowing for portable technique the lungs are clear. Visualized tracheal air column is within normal limits. Stable visualized osseous structures. Stable cholecystectomy clips. Negative visible bowel gas pattern. IMPRESSION: No acute cardiopulmonary abnormality or acute traumatic injury identified. Electronically Signed   By: Genevie Ann M.D.   On: 04/27/2019 21:49   Dg Chest Portable 1 View  Result Date: 04/26/2019 CLINICAL DATA:  Syncope EXAM: PORTABLE CHEST 1 VIEW COMPARISON:  07/21/2018 FINDINGS: The heart size and mediastinal contours are within normal limits. Both lungs are clear. The visualized skeletal structures are unremarkable. Prior ORIF of the right anterior second rib. IMPRESSION: No active disease. Electronically Signed   By: Kathreen Devoid   On: 04/26/2019 13:11    EKG: Independently reviewed.   Active Problems:   * No active hospital problems. *   Assessment/Plan Subarachnoid hemorrhage: -Syncope reported, resulting in quite a fall and subsequent subarachnoid hemorrhage -Patient continues to report headache, otherwise, no other significant findings. -Neurosurgery input is appreciated.  For repeat CTA to rule out aneurysms. -Neurochecks -Keppra 500 mg p.o. twice daily for 1 week for prophylaxis -No aspirin -Further management will depend on hospital course  Reported syncope: Etiology unknown. Cardiac monitoring. Check orthostasis. Further management will depend on level.  COVID-19 infection: Asymptomatic for now. No home oxygen. Monitor closely  Hypertension: Continue to optimize.  History of aortic stenosis status post bioprosthetic AVR: Supportive care.  Further management will  depend on hospital course.  DVT prophylaxis: SCD Code Status: Full code Family Communication:  Disposition Plan: This will depend on hospital course Consults called: ER consulted neurosurgery Admission status: Inpatient  Time spent: 50 minutes.   Dana Allan, MD  [facility] Pager #: [phone] 7PM-7AM contact night coverage as above  04/28/2019, 12:51 AM

## 2019-04-28 NOTE — Telephone Encounter (Signed)
Copied from [place] [id number]. Topic: General - Other >> Apr 28, 2019  9:30 AM Yvette Rack wrote: Reason for CRM: Pt son Elberta Fortis stated pt keeps passing out and falling down and he feels there are some neuro issues. Pt son stated he does not want pt to just go home from the hospital without looking into the situation further because the passing out and falling down happens to often. Pt son requests call from Dr. Larose Kells to discuss pt possibly seeing a neuro doctor. Cb# [phone]

## 2019-04-28 NOTE — ED Notes (Signed)
Eric [name] [phone], please call and update son

## 2019-04-28 NOTE — Plan of Care (Signed)
  Problem: Education: Goal: Knowledge of risk factors and measures for prevention of condition will improve Outcome: Progressing   Problem: Coping: Goal: Psychosocial and spiritual needs will be supported Outcome: Progressing   Problem: Respiratory: Goal: Will maintain a patent airway Outcome: Progressing Goal: Complications related to the disease process, condition or treatment will be avoided or minimized Outcome: Progressing   

## 2019-04-28 NOTE — Assessment & Plan Note (Signed)
COVID-19: Diagnosed with COVID-19 approximately 4 days ago, having diarrhea, mild cough, generalized weakness. Went to the ER with near syncope, work-up showed with elevated creatinine, no hypoxia, multiple findings on the CT. At this point, he is a still not feeling well but I believe he is okay to stay home with the following instructions: Push fluids, increase hydration Hold Micardis and spironolactone for the next 2 to 3 days, check BPs and send me a report in 3 days Pepto-Bismol as needed Reassess in person in 2 weeks, sooner if needed. HTN: As above Abnormal CT: Multiple findings, patient aware of them, will address them at the next visit. Findings include: + Right inguinal hernia with part of the bladder protruding through it.  Consider surgical referral. + Infrarenal abdominal aortic ectasis at risk for aneurysm. Recommend follow-up in 5 years  Also 4.8 mm millimeter groundglass nodule on the right lower lobe, CT in 6 to 12 months recommended. Instructions sent to the patient via message

## 2019-04-28 NOTE — Telephone Encounter (Signed)
Please let him know and I will call him, will be after 6 PM most likely.

## 2019-04-28 NOTE — ED Provider Notes (Signed)
Briefly patient is a 73 y.o. Covid positive male who presented for syncopal episode and head trauma.  Syncope likely due to dehydration and over diuresing.  Was pending CT scan.  CT with traumatic subarachnoid and parenchymal bleed.  Patient not on any anticoagulation.  Neurosurgery consulted who evaluated patient in the emergency department.  Patient admitted to hospitalist service for syncope.    .Critical Care Performed by: Fatima Blank, MD Authorized by: Fatima Blank, MD      CRITICAL CARE Performed by: Grayce Sessions Cardama Total critical care time: 30 minutes Critical care time was exclusive of separately billable procedures and treating other patients. Critical care was necessary to treat or prevent imminent or life-threatening deterioration. Critical care was time spent personally by me on the following activities: development of treatment plan with patient and/or surrogate as well as nursing, discussions with consultants, evaluation of patient's response to treatment, examination of patient, obtaining history from patient or surrogate, ordering and performing treatments and interventions, ordering and review of laboratory studies, ordering and review of radiographic studies, pulse oximetry and re-evaluation of patient's condition.        Fatima Blank, MD 04/28/19 [phone]

## 2019-04-28 NOTE — Telephone Encounter (Signed)
Eric [name] made aware that PCP will reach out later today.

## 2019-04-28 NOTE — Consult Note (Addendum)
Chief Complaint   Chief Complaint  Patient presents with   Fall    HPI   Consult requested by: Dr Christy Gentles Reason for consult: traumatic SAH, contusion, occipital skull fracture  HPI: Nell Eckerson is a 73 y.o. male with multiple comorbidities including recent COVID-19 infection who presented to the ED after a syncopal episode. He is unable to provide any history. He just remembers waking up in the ambulance. No family at bedside to discuss further with. Does believe he hit head due to occipital head pain. Was seen yesterday in ED for episode of dizziness and lightheadedness. He underwent work up by EDP and was found to have left frontal contusion and frontal SAH. NSY consultation requested. Patient to be admitted for syncope work up.  Complains of diffuse headache. + nausea without vomiting. No changes in vision, weakness, N/T, focal deficit.  He takes 81mg  ASA daily. No other blood thinning agents.   Patient Active Problem List   Diagnosis Date Noted   Rheumatoid arthritis ([place]) 11/17/2018   Spinal stenosis of lumbar region 04/23/2018   HNP (herniated nucleus pulposus), lumbar 04/23/2018   Prolapsed lumbar disc 03/03/2018   Wide-complex tachycardia ([place]) 12/30/2017   Left bundle branch block (LBBB) 12/18/2017   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 12/17/2017   Abnormal LFTs    Macular edema    PCP NOTES >>>>>> 04/27/2015   Hyperglycemia 10/20/2014   Dizziness 04/21/2014   Annual physical exam 04/15/2011   Aortic stenosis 04/15/2011   Hypothyroidism 10/13/2007   Hyperlipidemia 10/13/2007   Hypertension 04/08/2007    PMH: Past Medical History:  Diagnosis Date   Abnormal LFTs    Anxiety    Aortic stenosis 04/15/2011   Dizziness 04/21/2014   Dyspnea    Heart murmur    HNP (herniated nucleus pulposus), lumbar    L5-S1   Hyperglycemia 10/20/2014   Hyperlipidemia    Hypertension    Hypothyroidism     Left bundle branch block (LBBB) 12/18/2017   Lumbar stenosis    Macular edema    Dr  Sherlynn Stalls    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 12/17/2017   Edwards Intuity Elite rapid deployment stented bovine pericardial tissue valve via right mini thoracotomy approach    PSH: Past Surgical History:  Procedure Laterality Date   AORTIC VALVE REPLACEMENT N/A 12/17/2017   Procedure: MINIMALLY INVASIVE AORTIC VALVE REPLACEMENT (AVR) [ using Edwards Intuity Valve Size 84mm;  Surgeon: Rexene Alberts, MD;  Location: [facility];  Service: Open Heart Surgery;  Laterality: N/A;  MINI THORACOTOMY   CARDIAC CATHETERIZATION  11/10/00   CATARACT EXTRACTION W/ INTRAOCULAR LENS IMPLANT     RIGHT EYE   CHOLECYSTECTOMY     EYE SURGERY     Right eye cataract   INGUINAL HERNIA REPAIR     (B)   LUMBAR LAMINECTOMY/DECOMPRESSION MICRODISCECTOMY N/A 04/23/2018   Procedure: Microlumbar decompression Lumbar five-Sacral one;  Surgeon: Susa Day, MD;  Location: [facility];  Service: Orthopedics;  Laterality: N/A;   POLYPECTOMY     RIGHT/LEFT HEART CATH AND CORONARY ANGIOGRAPHY N/A 10/16/2017   Procedure: RIGHT/LEFT HEART CATH AND CORONARY ANGIOGRAPHY;  Surgeon: [name], MD;  Location: [facility];  Service: Cardiovascular;  Laterality: N/A;   TEE WITHOUT CARDIOVERSION N/A 12/17/2017   Procedure: TRANSESOPHAGEAL ECHOCARDIOGRAM (TEE);  Surgeon: Rexene Alberts, MD;  Location: [facility];  Service: Open Heart Surgery;  Laterality: N/A;   TONSILLECTOMY      (  Not in a hospital admission)   SH: Social History   Tobacco Use   Smoking status: Former Smoker    Quit date: 04/15/1990    Years since quitting: 29.0   Smokeless tobacco: Never Used   Tobacco comment: used to smoke 2 ppd  Substance Use Topics   Alcohol use: Not Currently    Comment: no ETOH since the 90s   Drug use: No    MEDS: Prior to Admission medications   Medication Sig Start Date End  Date Taking? Authorizing Provider  amLODipine (NORVASC) 5 MG tablet Take 0.5 tablets (2.5 mg total) by mouth daily. 11/17/18   Colon Branch, MD  aspirin EC 81 MG tablet Take 81 mg by mouth daily.    [provider]  carvedilol (COREG) 12.5 MG tablet TAKE 1 TABLET (12.5 MG TOTAL) BY MOUTH 2 (TWO) TIMES DAILY WITH A MEAL. 04/13/19   [name], MD  Cholecalciferol (VITAMIN D-3 PO) Take 2,000 Units by mouth daily.    [provider]  folic acid (FOLVITE) 1 MG tablet Take 2 tablets (2 mg total) by mouth daily. 09/25/18   [name], MD  levothyroxine (SYNTHROID) 88 MCG tablet Take 1 tablet (88 mcg total) by mouth daily before breakfast. 12/14/18   Colon Branch, MD  methotrexate (RHEUMATREX) 2.5 MG tablet TAKE 4 TABLETS BY MOUTH ONE TIME PER WEEK 04/05/19   [name], MD  predniSONE (DELTASONE) 5 MG tablet Take 1 tablet (5 mg total) by mouth daily with breakfast. 02/22/19   [name], MD  rosuvastatin (CRESTOR) 5 MG tablet Take 1 tablet (5 mg total) by mouth daily. 04/20/19   Colon Branch, MD  spironolactone (ALDACTONE) 25 MG tablet TAKE 1 TABLET BY MOUTH EVERY DAY 12/30/18   [name], MD  telmisartan (MICARDIS) 40 MG tablet TAKE ONE TABLET (40 MG TOTAL) BY MOUTH DAILY in A.M. AND ONE TABLET (40 MG TOTAL) BY MOUTH IN AFTERNOON ON Monday & Thursday also. 02/17/19   Loel Dubonnet, NP    ALLERGY: Allergies  Allergen Reactions   Atorvastatin Other (See Comments)    REACTION: myalgia   Ezetimibe Other (See Comments)    REACTION: myalgia    Social History   Tobacco Use   Smoking status: Former Smoker    Quit date: 04/15/1990    Years since quitting: 29.0   Smokeless tobacco: Never Used   Tobacco comment: used to smoke 2 ppd  Substance Use Topics   Alcohol use: Not Currently    Comment: no ETOH since the 90s     Family History  Problem Relation Age of Onset   Diabetes Mother    Stroke Mother    Diabetes Brother        ?   Coronary  artery disease Brother        Male 1st degree relative >50, had CABG in his 69s   Emphysema Father    COPD Father    Diabetes Daughter    Colon cancer Neg Hx    Prostate cancer Neg Hx    Colon polyps Neg Hx    Esophageal cancer Neg Hx    Rectal cancer Neg Hx    Stomach cancer Neg Hx      ROS   Review of Systems  Constitutional: Negative.   HENT: Negative.   Eyes: Negative for blurred vision, double vision and photophobia.  Respiratory: Negative.   Cardiovascular: Negative.   Gastrointestinal: Positive for nausea. Negative for vomiting.  Musculoskeletal: Negative for neck pain.  Skin: Negative.   Neurological: Positive for loss of consciousness and headaches. Negative for dizziness, tingling, tremors, sensory change, speech change, focal weakness and weakness.    Exam   Vitals:   04/27/19 2030 04/27/19 2100  BP:  (!) 146/76  Pulse: 82 79  Resp: 17 (!) 25  Temp:    SpO2: 96% 96%   General appearance: WDWN, NAD GCS:15   Eyes: No scleral injection Cardiovascular: Regular rate and rhythm without murmurs, rubs, gallops. No edema or variciosities. Distal pulses normal. Pulmonary: Effort normal, non-labored breathing Musculoskeletal:     Muscle tone upper extremities: Normal    Muscle tone lower extremities: Normal    Motor exam: Upper Extremities Deltoid Bicep Tricep Grip  Right 5/5 5/5 5/5 5/5  Left 5/5 5/5 5/5 5/5   Lower Extremity IP Quad PF DF EHL  Right 5/5 5/5 5/5 5/5 5/5  Left 5/5 5/5 5/5 5/5 5/5   Neurological Mental Status:    - Patient is awake, alert, oriented to person, place, month, year, and situation    - Patient is able to give a clear and coherent history.    - No signs of aphasia or neglect Cranial Nerves    - II: Visual Fields are full. PERRL    - III/IV/VI: EOMI without ptosis or diploplia.     - V: Facial sensation is grossly normal    - VII: Facial movement is symmetric.     - VIII: hearing is intact to voice    - X: Uvula  elevates symmetrically    - XI: Shoulder shrug is symmetric.    - XII: tongue is midline without atrophy or fasciculations.  Sensory: Sensation grossly intact to LT Cerebellar    - FNF and HKS are intact bilaterally  No evidence of CSF leak on exam - no rhinorrhea/otorrhea  Results - Imaging/Labs   Results for orders placed or performed during the hospital encounter of 04/27/19 (from the past 48 hour(s))  CBC with Differential     Status: Abnormal   Collection Time: 04/27/19  9:14 PM  Result Value Ref Range   WBC 5.2 4.0 - 10.5 K/uL   RBC 3.93 (L) 4.22 - 5.81 MIL/uL   Hemoglobin 13.2 13.0 - 17.0 g/dL   HCT 39.8 39.0 - 52.0 %   MCV 101.3 (H) 80.0 - 100.0 fL   MCH 33.6 26.0 - 34.0 pg   MCHC 33.2 30.0 - 36.0 g/dL   RDW 13.0 11.5 - 15.5 %   Platelets 219 150 - 400 K/uL   nRBC 0.0 0.0 - 0.2 %   Neutrophils Relative % 73 %   Neutro Abs 3.8 1.7 - 7.7 K/uL   Lymphocytes Relative 15 %   Lymphs Abs 0.8 0.7 - 4.0 K/uL   Monocytes Relative 12 %   Monocytes Absolute 0.6 0.1 - 1.0 K/uL   Eosinophils Relative 0 %   Eosinophils Absolute 0.0 0.0 - 0.5 K/uL   Basophils Relative 0 %   Basophils Absolute 0.0 0.0 - 0.1 K/uL   Immature Granulocytes 0 %   Abs Immature Granulocytes 0.02 0.00 - 0.07 K/uL    Comment: Performed at [facility], [address] [place], [place] [address]  Comprehensive metabolic panel     Status: Abnormal   Collection Time: 04/27/19  9:14 PM  Result Value Ref Range   Sodium 135 135 - 145 mmol/L   Potassium 4.1 3.5 - 5.1 mmol/L   Chloride  101 98 - 111 mmol/L   CO2 24 22 - 32 mmol/L   Glucose, Bld 100 (H) 70 - 99 mg/dL   BUN 19 8 - 23 mg/dL   Creatinine, Ser 1.28 (H) 0.61 - 1.24 mg/dL   Calcium 9.0 8.9 - 10.3 mg/dL   Total Protein 6.9 6.5 - 8.1 g/dL   Albumin 3.9 3.5 - 5.0 g/dL   AST 33 15 - 41 U/L   ALT 26 0 - 44 U/L   Alkaline Phosphatase 53 38 - 126 U/L   Total Bilirubin 1.2 0.3 - 1.2 mg/dL   GFR calc non Af Amer 55 (L) >60 mL/min   GFR calc Af  Amer >60 >60 mL/min   Anion gap 10 5 - 15    Comment: Performed at [facility] [address], [place], [place] [address]  Troponin I (High Sensitivity)     Status: None   Collection Time: 04/27/19  9:14 PM  Result Value Ref Range   Troponin I (High Sensitivity) 9 <18 ng/L    Comment: (NOTE) Elevated high sensitivity troponin I (hsTnI) values and significant  changes across serial measurements may suggest ACS but many other  chronic and acute conditions are known to elevate hsTnI results.  Refer to the "Links" section for chest pain algorithms and additional  guidance. Performed at [facility], [place] [address], [place], [place] [address]     Ct Head Wo Contrast  Result Date: 04/27/2019 CLINICAL DATA:  Fall, hematoma on back of head. EXAM: CT HEAD WITHOUT CONTRAST CT CERVICAL SPINE WITHOUT CONTRAST TECHNIQUE: Multidetector CT imaging of the head and cervical spine was performed following the standard protocol without intravenous contrast. Multiplanar CT image reconstructions of the cervical spine were also generated. COMPARISON:  None. FINDINGS: CT HEAD FINDINGS Brain: there is subarachnoid blood noted over both frontal lobes, along the anterior falx and in the both sylvian fissures. Intracerebral contusion noted within the inferior anterior left frontal lobe measuring 2.1 x 1.1 cm. No midline shift. No hydrocephalus. Vascular: No hyperdense vessel or unexpected calcification. Skull: There is a nondisplaced left occipital bone fracture. Sinuses/Orbits: No acute finding. Other: Soft tissue swelling in the left posterior scalp. CT CERVICAL SPINE FINDINGS Alignment: No subluxation Skull base and vertebrae: No acute fracture. No primary bone lesion or focal pathologic process. Soft tissues and spinal canal: No prevertebral fluid or swelling. No visible canal hematoma. Disc levels:  Diffuse degenerative disc and facet disease. Upper chest: No acute findings Other: None IMPRESSION:  Subarachnoid hemorrhage overlying the frontal lobes, within the sylvian fissures, and along the anterior falx. 2.1 x 1.1 cm intracerebral hemorrhage/contusion in the inferior left frontal lobe. No midline shift. Nondisplaced left occipital bone fracture Critical Value/emergent results were called by telephone at the time of interpretation on 04/27/2019 at 11:38 pm to providerDr. Leonette Monarch, Who verbally acknowledged these results. Electronically Signed   By: Rolm Baptise M.D.   On: 04/27/2019 23:47   Ct Cervical Spine Wo Contrast  Result Date: 04/27/2019 CLINICAL DATA:  Fall, hematoma on back of head. EXAM: CT HEAD WITHOUT CONTRAST CT CERVICAL SPINE WITHOUT CONTRAST TECHNIQUE: Multidetector CT imaging of the head and cervical spine was performed following the standard protocol without intravenous contrast. Multiplanar CT image reconstructions of the cervical spine were also generated. COMPARISON:  None. FINDINGS: CT HEAD FINDINGS Brain: there is subarachnoid blood noted over both frontal lobes, along the anterior falx and in the both sylvian fissures. Intracerebral contusion noted within the inferior  anterior left frontal lobe measuring 2.1 x 1.1 cm. No midline shift. No hydrocephalus. Vascular: No hyperdense vessel or unexpected calcification. Skull: There is a nondisplaced left occipital bone fracture. Sinuses/Orbits: No acute finding. Other: Soft tissue swelling in the left posterior scalp. CT CERVICAL SPINE FINDINGS Alignment: No subluxation Skull base and vertebrae: No acute fracture. No primary bone lesion or focal pathologic process. Soft tissues and spinal canal: No prevertebral fluid or swelling. No visible canal hematoma. Disc levels:  Diffuse degenerative disc and facet disease. Upper chest: No acute findings Other: None IMPRESSION: Subarachnoid hemorrhage overlying the frontal lobes, within the sylvian fissures, and along the anterior falx. 2.1 x 1.1 cm intracerebral hemorrhage/contusion in the  inferior left frontal lobe. No midline shift. Nondisplaced left occipital bone fracture Critical Value/emergent results were called by telephone at the time of interpretation on 04/27/2019 at 11:38 pm to providerDr. Leonette Monarch, Who verbally acknowledged these results. Electronically Signed   By: Rolm Baptise M.D.   On: 04/27/2019 23:47   Ct Abdomen Pelvis W Contrast  Result Date: 04/26/2019 CLINICAL DATA:  Abdominal distension, lightheadedness with fall EXAM: CT ABDOMEN AND PELVIS WITH CONTRAST TECHNIQUE: Multidetector CT imaging of the abdomen and pelvis was performed using the standard protocol following bolus administration of intravenous contrast. CONTRAST:  198mL OMNIPAQUE IOHEXOL 300 MG/ML  SOLN COMPARISON:  CTA November 10, 2017 FINDINGS: Lower chest: Basilar areas of atelectasis and/or scarring. 8 mm ground-glass nodule present in the right lower lobe. Normal heart size. Aortic valve replacement is noted. Coronary artery calcifications are present. No pericardial effusion. Hepatobiliary: No focal liver abnormality is seen. Patient is post cholecystectomy. Slight prominence of the biliary tree likely related to reservoir effect. No calcified intraductal gallstones. Pancreas: Unremarkable. No pancreatic ductal dilatation or surrounding inflammatory changes. Spleen: Normal in size without focal abnormality. Adrenals/Urinary Tract: Adrenal glands are normal. Mild bilateral symmetric perinephric stranding, a nonspecific finding though may correlate with either age or decreased renal function. Subcentimeter hypoattenuating focus in the upper pole left kidney is too small to fully characterize on CT imaging but statistically likely benign. The kidneys are otherwise unremarkable, without renal calculi, suspicious lesion, or hydronephrosis. No extravasation of contrast is seen on excretory phase delayed imaging. Portion of the right anterolateral bladder protrudes into the right fat containing hernia. No resulting  obstruction. Stomach/Bowel: Distal esophagus, stomach and duodenal sweep are unremarkable. No small bowel wall thickening or dilatation. No evidence of obstruction. A normal appendix is visualized. No colonic dilatation or wall thickening. Scattered colonic diverticula without focal pericolonic inflammation to suggest diverticulitis. Vascular/Lymphatic: There dominantly aorta is tortuous and calcified with bilobed fusiform ectatic segments. The first arising just below the level of the renal arteries measures up to 2.4 cm in diameter with some eccentric mural thrombus. The second meant just proximal to the bifurcation measures up to 2.7 cm. No extension into the iliac arteries. No proximal occlusions. No suspicious or enlarged lymph nodes in the included lymphatic chains. Reproductive: Coarse eccentric calcification of the prostate. No concerning abnormalities of the prostate or seminal vesicles. Other: Enlarging fat containing right inguinal hernia with portion the right anterolateral bladder protruding through the hernia aperture. Smaller fat containing left inguinal hernia and tiny umbilical hernia are present as well. No bowel containing hernias or resulting obstruction. No free air or fluid in the abdomen or pelvis. Musculoskeletal: Multilevel degenerative changes are present in the imaged portions of the spine. No acute osseous abnormality or suspicious osseous lesion. IMPRESSION: 1. No acute intra-abdominal process. Few  air-filled loops of bowel without frank distention or obstruction. 2. Slightly enlarging fat containing right inguinal hernia with portion of the right anterolateral bladder protruding through the hernia aperture. No bowel containing hernias. 3. Bilobed fusiform infrarenal abdominal aortic ectasia measuring with lobulations measuring up to 2.4 cm and 2.7 cm in diameter, not significantly changed from prior study. Ectatic abdominal aorta at risk for aneurysm development. Recommend followup by  ultrasound in 5 years. This recommendation follows ACR consensus guidelines: White Paper of the ACR Incidental Findings Committee II on Vascular Findings. J Am Coll Radiol 2013; 10:789-794. Aortic aneurysm NOS (ICD10-I71.9) 4. 8 mm ground-glass nodule in the right lower lobe. Initial follow-up with CT at 6-12 months is recommended to confirm persistence. If persistent, repeat CT is recommended every 2 years until 5 years of stability has been established. This recommendation follows the consensus statement: Guidelines for Management of Incidental Pulmonary Nodules Detected on CT Images: From the Fleischner Society 2017; Radiology 2017; 284:228-243. 5. Aortic Atherosclerosis (ICD10-I70.0). 6. Prior aortic valve replacement. Electronically Signed   By: Lovena Le M.D.   On: 04/26/2019 19:29   Dg Pelvis Portable  Result Date: 04/27/2019 CLINICAL DATA:  73 year old male status post unwitnessed fall. Recently tested positive for COVID-19. EXAM: PORTABLE PELVIS 1-2 VIEWS COMPARISON:  CT Abdomen and Pelvis 04/26/2019. FINDINGS: Portable AP supine view at 2132 hours. Femoral heads remain normally located. Pelvis appears stable and intact. Grossly intact proximal femurs. Bone mineralization is within normal limits. Negative visible lower abdominal and pelvic visceral contours. IMPRESSION: No acute fracture or dislocation identified about the pelvis. Electronically Signed   By: Genevie Ann M.D.   On: 04/27/2019 21:50   Dg Chest Port 1 View  Result Date: 04/27/2019 CLINICAL DATA:  73 year old male status post unwitnessed fall. Recently tested positive for COVID-19. EXAM: PORTABLE CHEST 1 VIEW COMPARISON:  Portable chest 04/26/2019 and earlier. FINDINGS: Portable AP upright view at 2129 hours. Lung volumes and mediastinal contours are stable and within normal limits. Prosthetic cardiac valve. Previous right anterior rib ORIF again noted. Allowing for portable technique the lungs are clear. Visualized tracheal air column  is within normal limits. Stable visualized osseous structures. Stable cholecystectomy clips. Negative visible bowel gas pattern. IMPRESSION: No acute cardiopulmonary abnormality or acute traumatic injury identified. Electronically Signed   By: Genevie Ann M.D.   On: 04/27/2019 21:49   Dg Chest Portable 1 View  Result Date: 04/26/2019 CLINICAL DATA:  Syncope EXAM: PORTABLE CHEST 1 VIEW COMPARISON:  07/21/2018 FINDINGS: The heart size and mediastinal contours are within normal limits. Both lungs are clear. The visualized skeletal structures are unremarkable. Prior ORIF of the right anterior second rib. IMPRESSION: No active disease. Electronically Signed   By: Kathreen Devoid   On: 04/26/2019 13:11   Impression/Plan   73 y.o. male with bilateral frontal SAH, inferior left frontal contusion and nondiplaced occipital skull fracture after a syncopal episode. He is neurologically intact with exception of amnestic to event.  SAH/Frontal contusion - No MLS or hydrocephalus. No indication for acute NS intervention. - CTA head to r/o aneurysmal cause for syncope given SAH, although very unlikely - appears traumatic - Neuro check q 2 hour, report any change - Repeat head CT tomorrow, sooner as indicated by exam - Keppra 500mg  Bid xa7days for seizure prophylaxis - hold ASA   Occipital skull fracture - Nondiplaced, no evidence of CSF leak on exam. No indication for NS intervention. - Pain control - monitor for CSF drainage  COVID-19 - per  primary Syncope - per primary  Please call for any concerns.  Ferne Reus, PA-C [facility] [employer]

## 2019-04-28 NOTE — Telephone Encounter (Signed)
Pt's son Elberta Fortis IS on [place]. Will call and inform him that PCP will reach out later today.

## 2019-04-28 NOTE — Progress Notes (Signed)
  NEUROSURGERY PROGRESS NOTE   No issues overnight.  No concerns this am Does endorse mild HA  Denies changes in vision, weakness  EXAM:  BP 106/70   Pulse 84   Temp 97.9 F (36.6 C) (Oral)   Resp (!) 23   Ht 5\' 6"  (1.676 m)   Wt 86.2 kg   SpO2 96%   BMI 30.67 kg/m   Awake, alert, oriented  Speech fluent, appropriate  CN grossly intact  5/5 BUE/BLE  No rhinorrhea/otorrhea  IMPRESSION/PLAN 72 y.o. male bilateral frontal SAH, inferior left frontal contusion and nondiplaced occipital skull fracture after a syncopal episode. Remains neurologically intact. No evidence of CSF drainage on exam. - Plan for repeat head CT later today. If stable, cleared from NS perspective for discharge  - continue work up per primary team for syncope

## 2019-04-28 NOTE — ED Notes (Signed)
Diet tray ordered 

## 2019-04-28 NOTE — ED Notes (Signed)
Pt wife [name] [phone]

## 2019-04-28 NOTE — ED Notes (Signed)
Breakfast Ordered 

## 2019-04-28 NOTE — Telephone Encounter (Signed)
I spoke with the patient's son Eric [name] after I reviewed the chart. He is concerned about the two recent syncope/near syncope being related to his heart. So far, I think that the last 2 syncopes are likely related to having COVID-19 and being extremely weak and possibly dehydrated. On chart review, he saw cardiology 03/29/2019 and he indeed was slightly lightheaded. We agreed that once I see him in follow-up after he is d/c from the hospital , I  will reassess the situation & try to figure it out if there is any other reason for him to have syncope/ near syncope other than COVID-19. Eric [name] express understanding.

## 2019-04-28 NOTE — Telephone Encounter (Addendum)
Reached out to patient to go over DEXA scan. Patient's wife advise od results. While on the phone patient's wife states she and Eric [name] have tested positive for COVID. Patient has been in emergency room 2 times. Patient has been having shortness of breath and dizziness. Per patient's wife he has been advised that he has a small hernia, an AAA forming and a spot on his lung. Prior to going to the emergency room he was having episodes of feeling like he was going to pass out and then fell. Patient's wife states Furqan has been taken of diuretics. He states patient has been having the shortness of breath and dizziness for several weeks prior to testing positive for COVID. Advised Adriell's wife that patient should hold MTX due to COVID infection. She verbalized understanding.

## 2019-04-29 ENCOUNTER — Inpatient Hospital Stay (HOSPITAL_COMMUNITY): Payer: Medicare Other

## 2019-04-29 DIAGNOSIS — N179 Acute kidney failure, unspecified: Secondary | ICD-10-CM

## 2019-04-29 LAB — BASIC METABOLIC PANEL
Anion gap: 11 (ref 5–15)
BUN: 12 mg/dL (ref 8–23)
CO2: 26 mmol/L (ref 22–32)
Calcium: 8.8 mg/dL — ABNORMAL LOW (ref 8.9–10.3)
Chloride: 102 mmol/L (ref 98–111)
Creatinine, Ser: 1.25 mg/dL — ABNORMAL HIGH (ref 0.61–1.24)
GFR calc Af Amer: >60 mL/min (ref >60)
GFR calc non Af Amer: 57 mL/min — ABNORMAL LOW (ref >60)
Glucose, Bld: 89 mg/dL (ref 70–99)
Potassium: 4 mmol/L (ref 3.5–5.1)
Sodium: 139 mmol/L (ref 135–145)

## 2019-04-29 MED ORDER — SODIUM CHLORIDE 0.9 % IV SOLN
INTRAVENOUS | Status: DC
  Administered 2019-04-29: 13:00:00 via INTRAVENOUS

## 2019-04-29 NOTE — Progress Notes (Signed)
Rehab Admissions Coordinator Note:  Per PT recommendation, this patient was screened by Raechel Ache for appropriateness for an Inpatient Acute Rehab Consult.  It is noted that pt is COVID-19 + with his first positive test on 11/13. Based on current protocol, pt can not be considered for CIR until he is 20 days past his initial test and without ongoing symptoms (~12/3) OR produce two negative tests.   Raechel Ache 04/29/2019, 2:41 PM  I can be reached at [phone].

## 2019-04-29 NOTE — Evaluation (Signed)
Physical Therapy Evaluation Patient Details Name: Eric [name] MRN: [id number] DOB: 1946-02-26 Today's Date: 04/29/2019   History of Present Illness  73 y.o. male admitted on 04/27/19 to [facility] due to fall with resultant SAH, L orbitofrontal contusion, non-displaced occipital fx, syncope related to dehydration and orthostatic BPs, acute kidney injury, COVID 19 infection.  Pt with other significant PMH of minimally invasive aortic valve replacement, lumbar stenosis (s/p surgery), LBBB, HTN, dizziness.  Clinical Impression  Pt dizzy and nauseated with mobility to EOB and standing.  Orthostatic BPs were taken, but he could not tolerate gait afterwards.  Basic vestibular assessment preformed, saccadic eye movements.  Pt likely has central vertigo, but need to r/o peripheral dysfunction as a cause of his initial fall.  More assessment needed.  Wife is also sick with COVID and does not believe she can take care of him at this time (spoke with her on the phone in pt's room).  PT to follow acutely for deficits listed below.     04/29/19 1035  Vital Signs  Patient Position (if appropriate) Orthostatic Vitals  Orthostatic Lying   BP- Lying 108/67  Pulse- Lying 70  Orthostatic Sitting  BP- Sitting 119/75  Pulse- Sitting 73  Orthostatic Standing at 0 minutes  BP- Standing at 0 minutes 97/63  Pulse- Standing at 0 minutes 74  Orthostatic Standing at 3 minutes  BP- Standing at 3 minutes 91/62  Pulse- Standing at 3 minutes 92  Oxygen Therapy  SpO2 96 %  O2 Device Room Air     Follow Up Recommendations Supervision/Assistance - 24 hour;CIR    Equipment Recommendations  None recommended by PT    Recommendations for Other Services OT consult     Precautions / Restrictions Precautions Precautions: Fall      Mobility  Bed Mobility Overal bed mobility: Modified Independent             General bed mobility comments: Pt able to come to EOB with light use of rail.   Transfers Overall  transfer level: Needs assistance Equipment used: 1 person hand held assist Transfers: Sit to/from Stand Sit to Stand: Min guard         General transfer comment: Min guard assist for safety during transitions due to (+) sway  Ambulation/Gait             General Gait Details: Pt unable after orthostatic BPs, too nauseated, dizzy         Balance Overall balance assessment: Needs assistance Sitting-balance support: Feet supported;No upper extremity supported Sitting balance-Leahy Scale: Good     Standing balance support: Single extremity supported Standing balance-Leahy Scale: Fair                   04/29/19 0001  Vestibular Assessment  General Observation (+) SDH, head trauma, ? dizzy before the fall pt can't remember, occipital bone fx (non displaced L)  Symptom Behavior  Type of Dizziness  Lightheadedness  Frequency of Dizziness with movement  Duration of Dizziness long  Symptom Nature Motion provoked  Aggravating Factors Activity in general  Relieving Factors Lying supine;Head stationary;Closing eyes  Progression of Symptoms No change since onset  Oculomotor Exam  Smooth Pursuits Saccades  Vestibulo-Ocular Reflex  VOR 1 Head Only (x 1 viewing) seems to do ok with both vertical and horizontal, but could only get him to do it for a few seconds (not sure why as it did not seem to make his symptoms worse).  Pertinent Vitals/Pain Pain Assessment: Faces Faces Pain Scale: Hurts even more Pain Location: posterior HA Pain Descriptors / Indicators: Aching Pain Intervention(s): Limited activity within patient's tolerance;Monitored during session;Repositioned    Home Living Family/patient expects to be discharged to:: Private residence Living Arrangements: Spouse/significant other(wife, Eric [name]) Available Help at Discharge: Family;Available 24 hours/day Type of Home: House Home Access: Level entry     Home Layout: One level Home  Equipment: Walker - 2 wheels;Bedside commode;Shower seat Additional Comments: wears glasses for readin    Prior Function Level of Independence: Independent         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, [employer]   Dominant Hand: Right    Extremity/Trunk Assessment   Upper Extremity Assessment Upper Extremity Assessment: Defer to OT evaluation    Lower Extremity Assessment Lower Extremity Assessment: Overall WFL for tasks assessed    Cervical / Trunk Assessment Cervical / Trunk Assessment: Other exceptions Cervical / Trunk Exceptions: h/o lumbar surgery  Communication   Communication: No difficulties  Cognition Arousal/Alertness: Awake/alert Behavior During Therapy: WFL for tasks assessed/performed Overall Cognitive Status: Within Functional Limits for tasks assessed                                               Assessment/Plan    PT Assessment Patient needs continued PT services  PT Problem List Decreased activity tolerance;Decreased balance;Decreased mobility;Decreased coordination;Decreased cognition;Decreased knowledge of use of DME;Decreased knowledge of precautions       PT Treatment Interventions DME instruction;Gait training;Functional mobility training;Stair training;Therapeutic activities;Therapeutic exercise;Balance training;Neuromuscular re-education;Patient/family education    PT Goals (Current goals can be found in the Care Plan section)  Acute Rehab PT Goals Patient Stated Goal: to feel better PT Goal Formulation: With patient Time For Goal Achievement: 05/13/19 Potential to Achieve Goals: Good    Frequency Min 3X/week           AM-PAC PT "6 Clicks" Mobility  Outcome Measure Help needed turning from your back to your side while in a flat bed without using bedrails?: A Little Help needed moving from lying on your back to sitting on the side of a flat bed without using bedrails?: A Little Help needed moving to and from a bed to a  chair (including a wheelchair)?: A Little Help needed standing up from a chair using your arms (e.g., wheelchair or bedside chair)?: A Little Help needed to walk in hospital room?: A Lot Help needed climbing 3-5 steps with a railing? : Total 6 Click Score: 15    End of Session   Activity Tolerance: Other (comment)(limited by nausea and dizziness.) Patient left: in bed;with call bell/phone within reach Nurse Communication: Mobility status PT Visit Diagnosis: Muscle weakness (generalized) (M62.81);Difficulty in walking, not elsewhere classified (R26.2);Dizziness and giddiness (R42)    Time: [id number] PT Time Calculation (min) (ACUTE ONLY): 25 min   Charges:          Verdene Lennert, PT, DPT  Acute Rehabilitation [phone] pager #[phone] office  @ [facility]: [phone]   PT Evaluation $PT Eval Moderate Complexity: 1 Mod PT Treatments $Therapeutic Activity: 8-22 mins       04/29/2019, 2:17 PM

## 2019-04-29 NOTE — Progress Notes (Addendum)
PROGRESS NOTE   Eric [name]  [id number]    DOB: [birth year]    DOA: 04/27/2019  PCP: Colon Branch, MD   I have briefly reviewed patients previous medical records in [facility].  Chief Complaint  Patient presents with   Fall    Brief Narrative:  73 year old married male with PMH of HTN, HLD, hypothyroidism, LBBB, aortic stenosis s/p bioprosthetic aortic valve replacement 12/17/2017, dizziness, both patient and spouse recently tested Covid positive, reportedly had a syncopal episode at home resulting in fall, hit to the head and subarachnoid hemorrhage.  Neurosurgery consulted.  Being managed conservatively/observation.   Assessment & Plan:   Active Problems:   Subarachnoid hemorrhage (HCC)   Subarachnoid hemorrhage/left orbitofrontal contusion/nondisplaced left occipital fracture  Likely related to syncope, fall and head injury.  Neurosurgery consultation appreciated and indicate that appearance is typical for traumatic etiology.  CTA head was performed and aneurysm was ruled out.  As per their follow-up, plan for repeat head CT later today and if stable cleared from neurosurgery perspective.  Continue Keppra x1 week for seizure prophylaxis.  Aspirin on hold.  Neurologically intact.  Repeat CT 11/19 appreciated.  Hemorrhagic contusion left frontal lobe not significantly changed.  Interval blooming of hemorrhagic contusion within anteroinferior right frontal lobe, with a 1 cm acute parenchymal hemorrhage at the site.  Scattered small volume SAH greatest along frontal lobes and within the sylvian fissures with new small volume IVH within occipital horns.  No hydrocephalus.  Await neurosurgery follow-up> I discussed with their team provider who is aware of CT findings.  Syncope  Suspect due to dehydration and orthostatic hypotension.  On day of admission, BP dropped from 149/74 supine to 128/75 standing.  Patient reports that he was recently taken off of Aldactone and  telmisartan  Telemetry shows sinus rhythm with known BBB morphology and no arrhythmias.  TTE 11/18: LVEF [id number], grade 1 diastolic dysfunction, cannot completely exclude mild apical hypokinesis  Carotid Doppler: 12/15/2017: b/l ICA stenosis 1-39%  Patient has been counseled that he should not drive for 6 months and he verbalized understanding.  Acute kidney injury  Creatinine had peaked to 1.37 on 11/16.  Likely related to diuretics, ARB and dehydration.  Slight creatinine uptake from 1.15-1.25.  Brief and gentle IV fluids.  Encourage oral fluid intake.  Follow BMP in a.m.  COVID-19 infection  Asymptomatic.  Not hypoxic.  Already on prednisone for rheumatoid arthritis, continue.  Monitor closely.  Remained stable.  Essential hypertension  Soft blood pressures.  Currently carvedilol 12.5 mg twice daily.  Discontinued amlodipine temporarily due to soft blood pressures.  Aldactone and telmisartan recently stopped by PCP.  Hypothyroidism  Clinically euthyroid.  TSH 1.391.  Continue Synthroid.  Hyperlipidemia  Continue statins.  Rheumatoid arthritis, seronegative  No acute flare.  Continue prior home dose of prednisone 4 mg daily.  Patient was advised by his Rheumatologist to hold methotrexate at this time, will hold.  Aortic stenosis, s/p bioprosthetic AV.  Uncomplicated right inguinal hernia  CT abdomen and pelvis with contrast 11/16 shows slightly enlarging fat-containing right inguinal hernia with portion of the right anterolateral bladder protruding through the hernial aperture.  Outpatient follow-up and surgical consultation as deemed necessary.  Bilobed fusiform infrarenal abdominal aortic ectasia  Noted on CT abdomen measuring 2.4 x 2.7 cm in diameter, at risk for aneurysmal development.  Recommend follow-up by ultrasound in 5 years per ACR consensus guidelines.  8 mm groundglass nodule in right lower lobe  Noted on CT abdomen.  Recommend follow-up with CT  in 6 to 12 months to confirm persistence and then follow-up as per radiology recommendations.   DVT prophylaxis: SCD Code Status: Full Family Communication: I discussed with patient's son in detail, updated care and answered questions. Disposition: DC home pending clinical improvement.   Consultants:  Neurosurgeon  Procedures:  None  Antimicrobials:  None   Subjective: Reports 4/10 occipital area headache.  Has ambulated, dizziness somewhat better.  No visual symptoms, nausea, vomiting.  As per RN, no acute issues noted.  Objective:  Vitals:   04/29/19 0700 04/29/19 1035 04/29/19 1553 04/29/19 1723  BP: 118/67  122/72   Pulse: 68  67 64  Resp: 16  16   Temp: 98.5 F (36.9 C)  98.6 F (37 C)   TempSrc: Oral  Oral   SpO2: 97% 96% 95%   Weight:      Height:        Examination:  General exam: Pleasant elderly male, moderately built and nourished lying comfortably propped up in bed without distress.  Oral mucosa with borderline hydration. Respiratory system: Clear to auscultation.  No increased work of breathing. Cardiovascular system: S1 and S2 heard, RRR.  No JVD, murmurs or pedal edema.  Telemetry personally reviewed: SR with BBB morphology. Gastrointestinal system: Abdomen is nondistended, soft and nontender. No organomegaly or masses felt. Normal bowel sounds heard. Central nervous system: Alert and oriented x3.  No focal neurological deficits. Extremities: Symmetric 5 x 5 power. Skin: No rashes, lesions or ulcers Psychiatry: Judgement and insight appear normal. Mood & affect appropriate.     Data Reviewed: I have personally reviewed following labs and imaging studies   CBC: Recent Labs  Lab 04/26/19 1138 04/27/19 2114  WBC 5.3 5.2  NEUTROABS 4.0 3.8  HGB 13.3 13.2  HCT 39.9 39.8  MCV 103.1* 101.3*  PLT 186 [id number]    Basic Metabolic Panel: Recent Labs  Lab 04/26/19 1138 04/27/19 2114 04/28/19 0127 04/28/19 0427 04/29/19 0554  NA 137 135  --  136  139  K 4.7 4.1  --  4.1 4.0  CL 107 101  --  103 102  CO2 20* 24  --  21* 26  GLUCOSE 113* 100*  --  129* 89  BUN 21 19  --  14 12  CREATININE 1.37* 1.28*  --  1.15 1.25*  CALCIUM 8.9 9.0  --  8.6* 8.8*  MG  --   --  1.9  --   --   PHOS  --   --  3.0  --   --     Liver Function Tests: Recent Labs  Lab 04/26/19 1146 04/27/19 2114 04/28/19 0427  AST 33 33 29  ALT 25 26 22   ALKPHOS 56 53 50  BILITOT 1.3* 1.2 1.0  PROT 6.6 6.9 6.2*  ALBUMIN 3.8 3.9 3.5    CBG: No results for input(s): GLUCAP in the last 168 hours.  Recent Results (from the past 240 hour(s))  Novel Coronavirus, NAA ([facility])     Status: Abnormal   Collection Time: 04/23/19 12:00 AM   Specimen: Nasopharyngeal(NP) swabs in vial transport medium   NASOPHARYNGE  TESTING  Result Value Ref Range Status   SARS-CoV-2, NAA Detected (A) Not Detected Final    Comment: Testing was performed using the cobas(R) SARS-CoV-2 test. This nucleic acid amplification test was developed and its performance characteristics determined by [employer]. Nucleic acid amplification tests include PCR and TMA. This test has not been FDA cleared or  approved. This test has been authorized by FDA under an Emergency Use Authorization (EUA). This test is only authorized for the duration of time the declaration that circumstances exist justifying the authorization of the emergency use of in vitro diagnostic tests for detection of SARS-CoV-2 virus and/or diagnosis of COVID-19 infection under section 564(b)(1) of the Act, 21 U.S.C. [id number]) (1), unless the authorization is terminated or revoked sooner. When diagnostic testing is negative, the possibility of a false negative result should be considered in the context of a patient's recent exposures and the presence of clinical signs and symptoms consistent with COVID-19. An individual without symptoms  of COVID-19 and who is not shedding SARS-CoV-2 virus would expect to have  a negative (not detected) result in this assay.       Radiology Studies: Ct Angio Head W Or Wo Contrast  Result Date: 04/28/2019 CLINICAL DATA:  Traumatic subarachnoid hemorrhage and skull fracture. EXAM: CT ANGIOGRAPHY HEAD TECHNIQUE: Multidetector CT imaging of the head was performed using the standard protocol during bolus administration of intravenous contrast. Multiplanar CT image reconstructions and MIPs were obtained to evaluate the vascular anatomy. CONTRAST:  69mL OMNIPAQUE IOHEXOL 350 MG/ML SOLN COMPARISON:  Head CT from yesterday FINDINGS: CTA HEAD Anterior circulation: Vessels are smooth and widely patent. No evidence of dissection at the skull base or upper neck. Negative for aneurysm or vascular malformation. No significant atheromatous changes or stenosis. The ICAs are tortuous in the neck. Posterior circulation: Left dominant vertebral artery. No evidence of dissection and no thrombosis or aneurysm. Venous sinuses: Limited assessment due to contrast timing with no evidence of thrombosis, including the left transverse sinus which is crossed by a fracture. Anatomic variants: None significant. A noncontrast phase was not repeated given the recent noncontrast scan. No evidence of progressive subarachnoid hemorrhage or inferior frontal contusion. IMPRESSION: No evidence of major vessel injury.  Negative for cerebral aneurysm. Electronically Signed   By: Monte Fantasia M.D.   On: 04/28/2019 05:54   Ct Head Wo Contrast  Result Date: 04/29/2019 CLINICAL DATA:  Subarachnoid hemorrhage, proven, follow-up. EXAM: CT HEAD WITHOUT CONTRAST TECHNIQUE: Contiguous axial images were obtained from the base of the skull through the vertex without intravenous contrast. COMPARISON:  CT angiogram head 04/28/2019, non-contrast CT head 04/27/2019 FINDINGS: Brain: A hemorrhagic contusion within the anteroinferior left frontal lobe has not significantly changed with hyperdense parenchymal hemorrhage measuring  2.3 x 1.0 cm in transaxial dimensions on the current examination. There has been interval blooming of hemorrhagic contusion within the anteroinferior right frontal lobe with a small focus of hemorrhage at this site measuring 1 cm. Scattered small volume subarachnoid hemorrhage is again seen, greatest overlying the anterior frontal lobes and within the sylvian fissures. There is now a small amount of hemorrhage within the occipital horns of the lateral ventricles, which may be secondary to interval redistribution. No evidence of hydrocephalus. No midline shift. Vascular: No hyperdense vessel. Skull: Redemonstrated nondisplaced left occipital calvarial fracture Sinuses/Orbits: Visualized orbits demonstrate no acute abnormality. Redemonstrated ethmoid sinus mucosal thickening with partial opacification of right ethmoid air cells. IMPRESSION: 1. A hemorrhagic parenchymal contusion within the anteroinferior left frontal lobe has not significantly changed. 2. Interval blooming of hemorrhagic contusion within the anteroinferior right frontal lobe, with a 1 cm acute parenchymal hemorrhage at this site. 3. Redemonstrated scattered small volume subarachnoid hemorrhage greatest along frontal lobes and within the sylvian fissures. New small volume intraventricular hemorrhage within the occipital horns may be secondary to interval redistribution. No hydrocephalus. Electronically Signed  By: Kellie Simmering DO   On: 04/29/2019 13:59   Ct Head Wo Contrast  Result Date: 04/27/2019 CLINICAL DATA:  Fall, hematoma on back of head. EXAM: CT HEAD WITHOUT CONTRAST CT CERVICAL SPINE WITHOUT CONTRAST TECHNIQUE: Multidetector CT imaging of the head and cervical spine was performed following the standard protocol without intravenous contrast. Multiplanar CT image reconstructions of the cervical spine were also generated. COMPARISON:  None. FINDINGS: CT HEAD FINDINGS Brain: there is subarachnoid blood noted over both frontal lobes, along  the anterior falx and in the both sylvian fissures. Intracerebral contusion noted within the inferior anterior left frontal lobe measuring 2.1 x 1.1 cm. No midline shift. No hydrocephalus. Vascular: No hyperdense vessel or unexpected calcification. Skull: There is a nondisplaced left occipital bone fracture. Sinuses/Orbits: No acute finding. Other: Soft tissue swelling in the left posterior scalp. CT CERVICAL SPINE FINDINGS Alignment: No subluxation Skull base and vertebrae: No acute fracture. No primary bone lesion or focal pathologic process. Soft tissues and spinal canal: No prevertebral fluid or swelling. No visible canal hematoma. Disc levels:  Diffuse degenerative disc and facet disease. Upper chest: No acute findings Other: None IMPRESSION: Subarachnoid hemorrhage overlying the frontal lobes, within the sylvian fissures, and along the anterior falx. 2.1 x 1.1 cm intracerebral hemorrhage/contusion in the inferior left frontal lobe. No midline shift. Nondisplaced left occipital bone fracture Critical Value/emergent results were called by telephone at the time of interpretation on 04/27/2019 at 11:38 pm to providerDr. Leonette Monarch, Who verbally acknowledged these results. Electronically Signed   By: Rolm Baptise M.D.   On: 04/27/2019 23:47   Ct Cervical Spine Wo Contrast  Result Date: 04/27/2019 CLINICAL DATA:  Fall, hematoma on back of head. EXAM: CT HEAD WITHOUT CONTRAST CT CERVICAL SPINE WITHOUT CONTRAST TECHNIQUE: Multidetector CT imaging of the head and cervical spine was performed following the standard protocol without intravenous contrast. Multiplanar CT image reconstructions of the cervical spine were also generated. COMPARISON:  None. FINDINGS: CT HEAD FINDINGS Brain: there is subarachnoid blood noted over both frontal lobes, along the anterior falx and in the both sylvian fissures. Intracerebral contusion noted within the inferior anterior left frontal lobe measuring 2.1 x 1.1 cm. No midline shift. No  hydrocephalus. Vascular: No hyperdense vessel or unexpected calcification. Skull: There is a nondisplaced left occipital bone fracture. Sinuses/Orbits: No acute finding. Other: Soft tissue swelling in the left posterior scalp. CT CERVICAL SPINE FINDINGS Alignment: No subluxation Skull base and vertebrae: No acute fracture. No primary bone lesion or focal pathologic process. Soft tissues and spinal canal: No prevertebral fluid or swelling. No visible canal hematoma. Disc levels:  Diffuse degenerative disc and facet disease. Upper chest: No acute findings Other: None IMPRESSION: Subarachnoid hemorrhage overlying the frontal lobes, within the sylvian fissures, and along the anterior falx. 2.1 x 1.1 cm intracerebral hemorrhage/contusion in the inferior left frontal lobe. No midline shift. Nondisplaced left occipital bone fracture Critical Value/emergent results were called by telephone at the time of interpretation on 04/27/2019 at 11:38 pm to providerDr. Leonette Monarch, Who verbally acknowledged these results. Electronically Signed   By: Rolm Baptise M.D.   On: 04/27/2019 23:47   Dg Pelvis Portable  Result Date: 04/27/2019 CLINICAL DATA:  73 year old male status post unwitnessed fall. Recently tested positive for COVID-19. EXAM: PORTABLE PELVIS 1-2 VIEWS COMPARISON:  CT Abdomen and Pelvis 04/26/2019. FINDINGS: Portable AP supine view at 2132 hours. Femoral heads remain normally located. Pelvis appears stable and intact. Grossly intact proximal femurs. Bone mineralization is within normal limits.  Negative visible lower abdominal and pelvic visceral contours. IMPRESSION: No acute fracture or dislocation identified about the pelvis. Electronically Signed   By: Genevie Ann M.D.   On: 04/27/2019 21:50   Dg Chest Port 1 View  Result Date: 04/27/2019 CLINICAL DATA:  73 year old male status post unwitnessed fall. Recently tested positive for COVID-19. EXAM: PORTABLE CHEST 1 VIEW COMPARISON:  Portable chest 04/26/2019 and  earlier. FINDINGS: Portable AP upright view at 2129 hours. Lung volumes and mediastinal contours are stable and within normal limits. Prosthetic cardiac valve. Previous right anterior rib ORIF again noted. Allowing for portable technique the lungs are clear. Visualized tracheal air column is within normal limits. Stable visualized osseous structures. Stable cholecystectomy clips. Negative visible bowel gas pattern. IMPRESSION: No acute cardiopulmonary abnormality or acute traumatic injury identified. Electronically Signed   By: Genevie Ann M.D.   On: 04/27/2019 21:49          Scheduled Meds:  carvedilol  12.5 mg Oral BID WC   cholecalciferol  2,000 Units Oral Daily   folic acid  2 mg Oral Daily   levETIRAcetam  500 mg Oral BID   levothyroxine  88 mcg Oral QAC breakfast   predniSONE  4 mg Oral Q breakfast   rosuvastatin  5 mg Oral Daily   vitamin C  500 mg Oral Daily   zinc sulfate  220 mg Oral Daily   Continuous Infusions:   LOS: 1 day     Vernell Leep, MD, FACP, [facility]. [facility]  To contact the attending provider between 7A-7P or the covering provider during after hours 7P-7A, please log into the web site [email or web address] and access using universal [place] password for that web site. If you do not have the password, please call the hospital operator.  04/29/2019, 5:54 PM

## 2019-04-30 LAB — BASIC METABOLIC PANEL
Anion gap: 12 (ref 5–15)
BUN: 12 mg/dL (ref 8–23)
CO2: 24 mmol/L (ref 22–32)
Calcium: 8.8 mg/dL — ABNORMAL LOW (ref 8.9–10.3)
Chloride: 102 mmol/L (ref 98–111)
Creatinine, Ser: 1.21 mg/dL (ref 0.61–1.24)
GFR calc Af Amer: >60 mL/min (ref >60)
GFR calc non Af Amer: 59 mL/min — ABNORMAL LOW (ref >60)
Glucose, Bld: 88 mg/dL (ref 70–99)
Potassium: 4 mmol/L (ref 3.5–5.1)
Sodium: 138 mmol/L (ref 135–145)

## 2019-04-30 NOTE — [place] FL2 (Signed)
[place] MEDICAID FL2 LEVEL OF CARE SCREENING TOOL     IDENTIFICATION  Patient Name: Eric [name] Birthdate: 09-01-1945 Sex: male Admission Date (Current Location): 04/27/2019  [facility] and [place] Number:  Herbalist and Address:  [facility] [place], [place] [address] [place], [place] [address]      Provider Number: [id number]  Attending Physician Name and Address:  Modena Jansky, MD  Relative Name and Phone Number:       Current Level of Care: Hospital Recommended Level of Care: [place] Prior Approval Number:    Date Approved/Denied:   PASRR Number: [id number]  Discharge Plan: SNF    Current Diagnoses: Patient Active Problem List   Diagnosis Date Noted  . Subarachnoid hemorrhage ([place]) 04/28/2019  . Rheumatoid arthritis ([place]) 11/17/2018  . Spinal stenosis of lumbar region 04/23/2018  . HNP (herniated nucleus pulposus), lumbar 04/23/2018  . Prolapsed lumbar disc 03/03/2018  . Wide-complex tachycardia ([place]) 12/30/2017  . Left bundle branch block (LBBB) 12/18/2017  . 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 Abnormal LFTs   . Macular edema   . PCP NOTES >>>>>> 04/27/2015  . Hyperglycemia 10/20/2014  . Dizziness 04/21/2014  . Annual physical exam 04/15/2011  . Aortic stenosis 04/15/2011  . Hypothyroidism 10/13/2007  . Hyperlipidemia 10/13/2007  . Hypertension 04/08/2007    Orientation RESPIRATION BLADDER Height & Weight     Self, Time, Situation, Place  Normal Continent Weight: 189 lb 13.1 oz (86.1 kg) Height:  5\' 6"  (167.6 cm)  BEHAVIORAL SYMPTOMS/MOOD NEUROLOGICAL BOWEL NUTRITION STATUS    Convulsions/Seizures Continent Diet(heart healthy)  AMBULATORY STATUS COMMUNICATION OF NEEDS Skin   Limited Assist Verbally Normal                       Personal Care Assistance Level of Assistance  Bathing, Dressing, Feeding Bathing Assistance: Limited  assistance Feeding assistance: Independent Dressing Assistance: Limited assistance     Functional Limitations Info  Sight Sight Info: Adequate        SPECIAL CARE FACTORS FREQUENCY  PT (By licensed PT), OT (By licensed OT)     PT Frequency: 5x/wk OT Frequency: 5x/wk            Contractures Contractures Info: Not present    Additional Factors Info  Code Status, Allergies, Isolation Precautions Code Status Info: Full Allergies Info: Atorvastatin, Ezetimibe     Isolation Precautions Info: Airborne/Contact: COVID+     Current Medications (04/30/2019):  This is the current hospital active medication list Current Facility-Administered Medications  Medication Dose Route Frequency Provider Last Rate Last Dose  . acetaminophen (TYLENOL) tablet 650 mg  650 mg Oral Q6H PRN Gardiner Barefoot, NP   650 mg at 04/29/19 1058  . carvedilol (COREG) tablet 12.5 mg  12.5 mg Oral BID WC Dana Allan I, MD   12.5 mg at 04/30/19 0814  . cholecalciferol (VITAMIN D3) tablet 2,000 Units  2,000 Units Oral Daily Dana Allan I, MD   2,000 Units at 04/30/19 [phone]  . folic acid (FOLVITE) tablet 2 mg  2 mg Oral Daily Dana Allan I, MD   1 mg at 04/30/19 0815  . HYDROcodone-acetaminophen (NORCO/VICODIN) 5-325 MG per tablet 1 tablet  1 tablet Oral Q4H PRN Opyd, Ilene Qua, MD   1 tablet at 04/30/19 1402  . HYDROmorphone (DILAUDID) injection 0.5 mg  0.5 mg Intravenous Q4H PRN Opyd, Ilene Qua, MD   0.5 mg at  04/28/19 2020  . levETIRAcetam (KEPPRA) tablet 500 mg  500 mg Oral BID Dana Allan I, MD   500 mg at 04/30/19 0817  . levothyroxine (SYNTHROID) tablet 88 mcg  88 mcg Oral QAC breakfast Dana Allan I, MD   88 mcg at 04/30/19 0817  . ondansetron (ZOFRAN) injection 4 mg  4 mg Intravenous Q6H PRN Opyd, Ilene Qua, MD   4 mg at 04/30/19 1453  . predniSONE (DELTASONE) tablet 4 mg  4 mg Oral Q breakfast Modena Jansky, MD   4 mg at 04/30/19 0818  . rosuvastatin (CRESTOR)  tablet 5 mg  5 mg Oral Daily Dana Allan I, MD   5 mg at 04/30/19 0818  . vitamin C (ASCORBIC ACID) tablet 500 mg  500 mg Oral Daily Kirby-Graham, Karsten Fells, NP   500 mg at 04/30/19 0817  . zinc sulfate capsule 220 mg  220 mg Oral Daily Gardiner Barefoot, NP   220 mg at 04/30/19 [id number]     Discharge Medications: Please see discharge summary for a list of discharge medications.  Relevant Imaging Results:  Relevant Lab Results:   Additional Information SS#: [id number]  Geralynn Ochs, LCSW

## 2019-04-30 NOTE — Evaluation (Signed)
Occupational Therapy Evaluation Patient Details Name: Eric [name] MRN: [id number] DOB: 05/31/1946 Today's Date: 04/30/2019    History of Present Illness 73 y.o. male admitted on 04/27/19 to [facility] due to fall with resultant SAH, L orbitofrontal contusion, non-displaced occipital fx, syncope related to dehydration and orthostatic BPs, acute kidney injury, COVID 19 infection.  Pt with other significant PMH of minimally invasive aortic valve replacement, lumbar stenosis (s/p surgery), LBBB, HTN, dizziness.   Clinical Impression   PTA Pt independent in ADL and mobility. Enjoys "tinkering" on his cars, retired, drives. Today Pt is limited by dizziness, nausea, and unable to maintain standing for grooming tasks like he normally would. (See orthostatic vitals below) He was unable to maintain standing for 3 min for second set of orthostatic vitals in standing. No nystagmus or eye movement noted this session, Pt reports that his eyes hurt and he is very hot. He potentially has a Rash on his side and back (red skin - washed down with cool wash cloth). Pt required mod A for boost from bed and min A for balance with pivotal steps to recliner. Pt will benefit from skilled OT in the acute setting to maximize safety and independence in transfers and ADL. At this time recommending SNF for daily therapy as Pt was independent prior to admission to work on transfers, mobility, activity tolerance, and independence in ADL   04/30/19 1100  Orthostatic Lying   BP- Lying 105/72  Pulse- Lying 80  Orthostatic Sitting  BP- Sitting 128/77  Pulse- Sitting 84  Orthostatic Standing at 0 minutes  BP- Standing at 0 minutes 109/70  Pulse- Standing at 0 minutes 86  Oxygen Therapy  SpO2 100 %  O2 Device Room Air               Follow Up Recommendations  SNF;Supervision/Assistance - 24 hour    Equipment Recommendations  None recommended by OT(Pt has appropriate DME)    Recommendations for Other Services        Precautions / Restrictions Precautions Precautions: Fall Precaution Comments: nausea Restrictions Weight Bearing Restrictions: No      Mobility Bed Mobility Overal bed mobility: Modified Independent             General bed mobility comments: Pt able to come to EOB with use of rail and extra time  Transfers Overall transfer level: Needs assistance Equipment used: Rolling walker (2 wheeled) Transfers: Sit to/from [facility] Sit to Stand: Mod assist Stand pivot transfers: Min assist       General transfer comment: Mod A for boost up from bed, vc for safe hand placement     Balance Overall balance assessment: Needs assistance Sitting-balance support: Feet supported;No upper extremity supported Sitting balance-Leahy Scale: Good     Standing balance support: Single extremity supported Standing balance-Leahy Scale: Fair Standing balance comment: provided RW today due to nausea                           ADL either performed or assessed with clinical judgement   ADL Overall ADL's : Needs assistance/impaired Eating/Feeding: Minimal assistance Eating/Feeding Details (indicate cue type and reason): but not eating "It makes me sick" Grooming: Wash/dry hands;Wash/dry face;Oral care;Set up;Sitting Grooming Details (indicate cue type and reason): Pt unable to maintain standing for grooming tasks Upper Body Bathing: Set up;Sitting   Lower Body Bathing: Set up;Sitting/lateral leans   Upper Body Dressing : Set up;Sitting   Lower Body Dressing: Minimal  assistance;Sit to/from stand   Toilet Transfer: Minimal assistance;Moderate assistance;Stand-pivot;RW Toilet Transfer Details (indicate cue type and reason): mod A for boost, min A for pivot Toileting- Clothing Manipulation and Hygiene: Minimal assistance;Sit to/from stand       Functional mobility during ADLs: Min guard;Cueing for safety;Cueing for sequencing;Rolling walker General ADL Comments:  limited by nausea, pain in eyeballs     Vision Baseline Vision/History: Wears glasses Wears Glasses: At all times Patient Visual Report: Eye fatigue/eye pain/headache Vision Assessment?: Vision impaired- to be further tested in functional context Additional Comments: Pt had trouble keeping eyes open due to pain for visual assessment     Perception     Praxis      Pertinent Vitals/Pain Pain Assessment: Faces Faces Pain Scale: Hurts little more Pain Location: lower back Pain Descriptors / Indicators: Aching Pain Intervention(s): Limited activity within patient's tolerance;Monitored during session;Repositioned     Hand Dominance Right   Extremity/Trunk Assessment Upper Extremity Assessment Upper Extremity Assessment: Overall WFL for tasks assessed   Lower Extremity Assessment Lower Extremity Assessment: Defer to PT evaluation   Cervical / Trunk Assessment Cervical / Trunk Assessment: Other exceptions Cervical / Trunk Exceptions: h/o lumbar surgery   Communication Communication Communication: No difficulties   Cognition Arousal/Alertness: Awake/alert Behavior During Therapy: WFL for tasks assessed/performed Overall Cognitive Status: Within Functional Limits for tasks assessed                                     General Comments  Pt feeling very hot all over, rash on side lower back??? RN notified    Exercises     Shoulder Instructions      Home Living Family/patient expects to be discharged to:: Private residence Living Arrangements: Spouse/significant other(wife, [name]) Available Help at Discharge: Family;Available 24 hours/day Type of Home: House Home Access: Level entry     Home Layout: One level     Bathroom Shower/Tub: Teacher, early years/pre: Standard     Home Equipment: Environmental consultant - 2 wheels;Bedside commode;Shower seat   Additional Comments: wears glasses for readin      Prior Functioning/Environment Level of  Independence: Independent        Comments: retired        OT Problem List: Decreased activity tolerance;Impaired balance (sitting and/or standing);Impaired vision/perception;Decreased knowledge of use of DME or AE;Decreased knowledge of precautions      OT Treatment/Interventions: Self-care/ADL training;DME and/or AE instruction;Therapeutic activities;Visual/perceptual remediation/compensation;Patient/family education;Balance training    OT Goals(Current goals can be found in the care plan section) Acute Rehab OT Goals Patient Stated Goal: to feel better OT Goal Formulation: With patient Time For Goal Achievement: 05/14/19 Potential to Achieve Goals: Good  OT Frequency: Min 2X/week   Barriers to D/C:            Co-evaluation              AM-PAC OT "6 Clicks" Daily Activity     Outcome Measure Help from another person eating meals?: A Little Help from another person taking care of personal grooming?: A Little Help from another person toileting, which includes using toliet, bedpan, or urinal?: A Lot Help from another person bathing (including washing, rinsing, drying)?: A Lot Help from another person to put on and taking off regular upper body clothing?: A Little Help from another person to put on and taking off regular lower body clothing?: A Lot 6 Click Score:  15   End of Session Equipment Utilized During Treatment: Rolling walker;Gait belt Nurse Communication: Mobility status;Precautions;Other (comment)(rash?)  Activity Tolerance: Other (comment)(treatment limited by nausea) Patient left: in chair;with call bell/phone within reach;with chair alarm set  OT Visit Diagnosis: Unsteadiness on feet (R26.81);Other abnormalities of gait and mobility (R26.89);Muscle weakness (generalized) (M62.81);History of falling (Z91.81);Dizziness and giddiness (R42)                Time: [id number] OT Time Calculation (min): 35 min Charges:  OT General Charges $OT Visit: 1 Visit OT  Evaluation $OT Eval Moderate Complexity: 1 Mod OT Treatments $Self Care/Home Management : 8-22 mins  Hulda Humphrey OTR/L Acute Rehabilitation Services Pager: [phone] Office: [place] 04/30/2019, 2:55 PM

## 2019-04-30 NOTE — Progress Notes (Signed)
  NEUROSURGERY PROGRESS NOTE   Repeat head CT reviewed and compared to prior. Expected evolution of contusion and redistribution of SAH. Cleared for d/c from NS perspective with outpt f/u in 4 weeks.  - complete 7 day course of keppra for seizure prophylaxis - continue to hold ASA until f/u appt  Please call for any concerns  Ferne Reus, [facility]

## 2019-04-30 NOTE — TOC Initial Note (Signed)
Transition of Care [facility]) - Initial/Assessment Note    Patient Details  Name: Eric [name] MRN: [id number] Date of Birth: [birth year]  Transition of [facility]) CM/SW Contact:    Geralynn Ochs, LCSW Phone Number: 04/30/2019, 3:20 PM  Clinical Narrative:    CSW earlier today noting per chart review that patient will not qualify for CIR due to COVID, and asked PT and OT to see patient again for updated therapy recommendations. OT recommending SNF placement.  CSW spoke with patient over the phone to discuss recommendation for SNF. CSW discussed with patient how he had fallen, and was requiring some assistance with therapy right now. Patient acknowledged, and reluctantly agreed that his wife probably wouldn't be able to help him right now. CSW discussed limited options available due to his COVID+ status, and patient says he is aware of [employer]. CSW obtained permission to fax out referral for patient, and told patient that he could think about it and CSW would follow back up with him over the weekend if he had any questions. CSW to fax out referral and will follow.             Expected Discharge Plan: Skilled Nursing Facility Barriers to Discharge: Continued Medical Work up, Ship broker   Patient Goals and CMS Choice Patient states their goals for this hospitalization and ongoing recovery are:: feel better, get back home soon CMS Medicare.gov Compare Post Acute Care list provided to:: Patient Choice offered to / list presented to : Patient  Expected Discharge Plan and Services Expected Discharge Plan: [place] Choice: [place] Living arrangements for the past 2 months: Single Family Home                                      Prior Living Arrangements/Services Living arrangements for the past 2 months: Single Family Home Lives with:: Spouse Patient language and need for interpreter reviewed:: No Do you  feel safe going back to the place where you live?: Yes      Need for Family Participation in Patient Care: No (Comment) Care giver support system in place?: No (comment)   Criminal Activity/Legal Involvement Pertinent to Current Situation/Hospitalization: No - Comment as needed  Activities of Daily Living Home Assistive Devices/Equipment: None ADL Screening (condition at time of admission) Patient's cognitive ability adequate to safely complete daily activities?: Yes Is the patient deaf or have difficulty hearing?: No Does the patient have difficulty seeing, even when wearing glasses/contacts?: No Does the patient have difficulty concentrating, remembering, or making decisions?: No Patient able to express need for assistance with ADLs?: Yes Does the patient have difficulty dressing or bathing?: No Independently performs ADLs?: Yes (appropriate for developmental age) Does the patient have difficulty walking or climbing stairs?: No Weakness of Legs: None Weakness of Arms/Hands: None  Permission Sought/Granted Permission sought to share information with : Chartered certified accountant granted to share information with : Yes, Verbal Permission Granted     Permission granted to share info w AGENCY: SNF        Emotional Assessment   Attitude/Demeanor/Rapport: Engaged Affect (typically observed): Pleasant Orientation: : Oriented to Self, Oriented to Place, Oriented to  Time, Oriented to Situation Alcohol / Substance Use: Not Applicable Psych Involvement: No (comment)  Admission diagnosis:  Subarachnoid hemorrhage (HCC) [I60.9] Syncope, unspecified syncope type [R55] Patient Active Problem List  Diagnosis Date Noted  . Subarachnoid hemorrhage ([place]) 04/28/2019  . Rheumatoid arthritis ([place]) 11/17/2018  . Spinal stenosis of lumbar region 04/23/2018  . HNP (herniated nucleus pulposus), lumbar 04/23/2018  . Prolapsed lumbar disc 03/03/2018  . Wide-complex tachycardia  ([place]) 12/30/2017  . Left bundle branch block (LBBB) 12/18/2017  . 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 Abnormal LFTs   . Macular edema   . PCP NOTES >>>>>> 04/27/2015  . Hyperglycemia 10/20/2014  . Dizziness 04/21/2014  . Annual physical exam 04/15/2011  . Aortic stenosis 04/15/2011  . Hypothyroidism 10/13/2007  . Hyperlipidemia 10/13/2007  . Hypertension 04/08/2007   PCP:  Colon Branch, MD Pharmacy:   [id number] - [place], [address] [place] [address] Phone: [phone] Fax: [phone]     Social Determinants of Health (SDOH) Interventions    Readmission Risk Interventions No flowsheet data found.

## 2019-04-30 NOTE — Progress Notes (Signed)
PROGRESS NOTE   Eric [name]  [id number]    DOB: 1945/07/24    DOA: 04/27/2019  PCP: Colon Branch, MD   I have briefly reviewed patients previous medical records in [facility]  Chief Complaint  Patient presents with   Fall    Brief Narrative:  73 year old married male with PMH of HTN, HLD, hypothyroidism, LBBB, aortic stenosis s/p bioprosthetic aortic valve replacement 12/17/2017, dizziness, both patient and spouse recently tested Covid positive, reportedly had a syncopal episode at home resulting in fall, hit to the head and subarachnoid hemorrhage.  Neurosurgery consulted.  Being managed conservatively/observation.  Improved.  Medically stable for discharge pending SNF availability.  Assessment & Plan:   Active Problems:   Subarachnoid hemorrhage (HCC)   Subarachnoid hemorrhage/left orbitofrontal contusion/nondisplaced left occipital fracture  Likely related to syncope, fall and head injury.  Neurosurgery consultation appreciated and indicate that appearance is typical for traumatic etiology.  CTA head was performed and aneurysm was ruled out.  As per their follow-up, plan for repeat head CT later today and if stable cleared from neurosurgery perspective.  Continue Keppra x1 week for seizure prophylaxis.  Aspirin on hold.  Neurologically intact.  Repeat CT 11/19 appreciated.  Hemorrhagic contusion left frontal lobe not significantly changed.  Interval blooming of hemorrhagic contusion within anteroinferior right frontal lobe, with a 1 cm acute parenchymal hemorrhage at the site.  Scattered small volume SAH greatest along frontal lobes and within the sylvian fissures with new small volume IVH within occipital horns.  No hydrocephalus.   Neurosurgery follow-up appreciated.  Expected evolution of contusion and redistribution of subarachnoid hemorrhage.  Cleared for discharge from their perspective with outpatient follow-up in 4 weeks, complete 7 days course of Keppra for  seizure prophylaxis and continue to hold aspirin until outpatient follow-up with them.  OT has evaluated and recommend SNF, social work involved.  Syncope  Suspect due to dehydration and orthostatic hypotension.  On day of admission, BP dropped from 149/74 supine to 128/75 standing.  Patient reports that he was recently taken off of Aldactone and telmisartan  Telemetry shows sinus rhythm with known BBB morphology and no arrhythmias.  TTE 11/18: LVEF [id number], grade 1 diastolic dysfunction, cannot completely exclude mild apical hypokinesis  Carotid Doppler: 12/15/2017: b/l ICA stenosis 1-39%  Patient has been counseled that he should not drive for 6 months and he verbalized understanding.  Not orthostatic today.  Acute kidney injury  Creatinine had peaked to 1.37 on 11/16.  Likely related to diuretics, ARB and dehydration.  Creatinine has improved from 1.25-1.21 after gentle IV fluids.  DC IV fluids.  Encourage oral intake.  COVID-19 infection  Asymptomatic.  Not hypoxic.  Already on prednisone for rheumatoid arthritis, continue.  Monitor closely.  Remained stable.  Essential hypertension  Soft blood pressures.  Currently carvedilol 12.5 mg twice daily.  Discontinued amlodipine temporarily due to soft blood pressures.  Aldactone and telmisartan recently stopped by PCP.  Remained stable.  Hypothyroidism  Clinically euthyroid.  TSH 1.391.  Continue Synthroid.  Hyperlipidemia  Continue statins.  Rheumatoid arthritis, seronegative  No acute flare.  Continue prior home dose of prednisone 4 mg daily.  Patient was advised by his Rheumatologist to hold methotrexate at this time, will hold.  Aortic stenosis, s/p bioprosthetic AV.  Uncomplicated right inguinal hernia  CT abdomen and pelvis with contrast 11/16 shows slightly enlarging fat-containing right inguinal hernia with portion of the right anterolateral bladder protruding through the hernial aperture.  Outpatient  follow-up and surgical  consultation as deemed necessary.  Bilobed fusiform infrarenal abdominal aortic ectasia  Noted on CT abdomen measuring 2.4 x 2.7 cm in diameter, at risk for aneurysmal development.  Recommend follow-up by ultrasound in 5 years per ACR consensus guidelines.  8 mm groundglass nodule in right lower lobe  Noted on CT abdomen.  Recommend follow-up with CT in 6 to 12 months to confirm persistence and then follow-up as per radiology recommendations.   DVT prophylaxis: SCD Code Status: Full Family Communication: I discussed with patient's son in detail, updated care and answered questions. Disposition: DC to SNF pending bed availability.   Consultants:  Neurosurgeon  Procedures:  None  Antimicrobials:  None   Subjective: Headache improved.  Reported some bilateral ear discomfort yesterday which has also improved.  Nausea without vomiting.  Had not ambulated when I saw him this morning.  Objective:  Vitals:   04/29/19 2234 04/30/19 0700 04/30/19 1100 04/30/19 1404  BP: 134/82 132/71  114/72  Pulse: 74 70  83  Resp:    18  Temp: 99.2 F (37.3 C) 99.5 F (37.5 C)  98 F (36.7 C)  TempSrc: Oral Oral  Axillary  SpO2: 96% 95% 100% 100%  Weight:      Height:        Examination:  General exam: Pleasant elderly male, moderately built and nourished lying comfortably propped up in bed without distress.  Oral mucosa with borderline hydration. Respiratory system: Clear to auscultation.  No increased work of breathing. Cardiovascular system: S1 and S2 heard, RRR.  No JVD, murmurs or pedal edema. Gastrointestinal system: Abdomen is nondistended, soft and nontender. No organomegaly or masses felt. Normal bowel sounds heard. Central nervous system: Alert and oriented x3.  No focal neurological deficits. Extremities: Symmetric 5 x 5 power. Skin: No rashes, lesions or ulcers Psychiatry: Judgement and insight appear normal. Mood & affect appropriate.     Data  Reviewed: I have personally reviewed following labs and imaging studies   CBC: Recent Labs  Lab 04/26/19 1138 04/27/19 2114  WBC 5.3 5.2  NEUTROABS 4.0 3.8  HGB 13.3 13.2  HCT 39.9 39.8  MCV 103.1* 101.3*  PLT 186 [id number]    Basic Metabolic Panel: Recent Labs  Lab 04/26/19 1138 04/27/19 2114 04/28/19 0127 04/28/19 0427 04/29/19 0554 04/30/19 0456  NA 137 135  --  136 139 138  K 4.7 4.1  --  4.1 4.0 4.0  CL 107 101  --  103 102 102  CO2 20* 24  --  21* 26 24  GLUCOSE 113* 100*  --  129* 89 88  BUN 21 19  --  14 12 12   CREATININE 1.37* 1.28*  --  1.15 1.25* 1.21  CALCIUM 8.9 9.0  --  8.6* 8.8* 8.8*  MG  --   --  1.9  --   --   --   PHOS  --   --  3.0  --   --   --     Liver Function Tests: Recent Labs  Lab 04/26/19 1146 04/27/19 2114 04/28/19 0427  AST 33 33 29  ALT 25 26 22   ALKPHOS 56 53 50  BILITOT 1.3* 1.2 1.0  PROT 6.6 6.9 6.2*  ALBUMIN 3.8 3.9 3.5    CBG: No results for input(s): GLUCAP in the last 168 hours.  Recent Results (from the past 240 hour(s))  Novel Coronavirus, NAA ([facility])     Status: Abnormal   Collection Time: 04/23/19 12:00 AM   Specimen: Nasopharyngeal(NP)  swabs in vial transport medium   NASOPHARYNGE  TESTING  Result Value Ref Range Status   SARS-CoV-2, NAA Detected (A) Not Detected Final    Comment: Testing was performed using the cobas(R) SARS-CoV-2 test. This nucleic acid amplification test was developed and its performance characteristics determined by [employer]. Nucleic acid amplification tests include PCR and TMA. This test has not been FDA cleared or approved. This test has been authorized by FDA under an Emergency Use Authorization (EUA). This test is only authorized for the duration of time the declaration that circumstances exist justifying the authorization of the emergency use of in vitro diagnostic tests for detection of SARS-CoV-2 virus and/or diagnosis of COVID-19 infection under section 564(b)(1) of the  Act, 21 U.S.C. [id number]) (1), unless the authorization is terminated or revoked sooner. When diagnostic testing is negative, the possibility of a false negative result should be considered in the context of a patient's recent exposures and the presence of clinical signs and symptoms consistent with COVID-19. An individual without symptoms  of COVID-19 and who is not shedding SARS-CoV-2 virus would expect to have a negative (not detected) result in this assay.       Radiology Studies: Ct Head Wo Contrast  Result Date: 04/29/2019 CLINICAL DATA:  Subarachnoid hemorrhage, proven, follow-up. EXAM: CT HEAD WITHOUT CONTRAST TECHNIQUE: Contiguous axial images were obtained from the base of the skull through the vertex without intravenous contrast. COMPARISON:  CT angiogram head 04/28/2019, non-contrast CT head 04/27/2019 FINDINGS: Brain: A hemorrhagic contusion within the anteroinferior left frontal lobe has not significantly changed with hyperdense parenchymal hemorrhage measuring 2.3 x 1.0 cm in transaxial dimensions on the current examination. There has been interval blooming of hemorrhagic contusion within the anteroinferior right frontal lobe with a small focus of hemorrhage at this site measuring 1 cm. Scattered small volume subarachnoid hemorrhage is again seen, greatest overlying the anterior frontal lobes and within the sylvian fissures. There is now a small amount of hemorrhage within the occipital horns of the lateral ventricles, which may be secondary to interval redistribution. No evidence of hydrocephalus. No midline shift. Vascular: No hyperdense vessel. Skull: Redemonstrated nondisplaced left occipital calvarial fracture Sinuses/Orbits: Visualized orbits demonstrate no acute abnormality. Redemonstrated ethmoid sinus mucosal thickening with partial opacification of right ethmoid air cells. IMPRESSION: 1. A hemorrhagic parenchymal contusion within the anteroinferior left frontal lobe has  not significantly changed. 2. Interval blooming of hemorrhagic contusion within the anteroinferior right frontal lobe, with a 1 cm acute parenchymal hemorrhage at this site. 3. Redemonstrated scattered small volume subarachnoid hemorrhage greatest along frontal lobes and within the sylvian fissures. New small volume intraventricular hemorrhage within the occipital horns may be secondary to interval redistribution. No hydrocephalus. Electronically Signed   By: Kellie Simmering DO   On: 04/29/2019 13:59          Scheduled Meds:  carvedilol  12.5 mg Oral BID WC   cholecalciferol  2,000 Units Oral Daily   folic acid  2 mg Oral Daily   levETIRAcetam  500 mg Oral BID   levothyroxine  88 mcg Oral QAC breakfast   predniSONE  4 mg Oral Q breakfast   rosuvastatin  5 mg Oral Daily   vitamin C  500 mg Oral Daily   zinc sulfate  220 mg Oral Daily   Continuous Infusions:   LOS: 2 days     Vernell Leep, MD, FACP, [facility]. [facility]  To contact the attending provider between 7A-7P or the covering provider during after hours  7P-7A, please log into the web site [email or web address] and access using universal [place] password for that web site. If you do not have the password, please call the hospital operator.  04/30/2019, 5:28 PM

## 2019-05-01 NOTE — Progress Notes (Signed)
Physical Therapy Treatment Patient Details Name: Eric [name] MRN: [id number] DOB: 1945/09/09 Today's Date: 05/01/2019    History of Present Illness 73 y.o. male admitted on 04/27/19 to [facility] due to fall with resultant SAH, L orbitofrontal contusion, non-displaced occipital fx, syncope related to dehydration and orthostatic BPs, acute kidney injury, COVID 19 infection.  Pt with other significant PMH of minimally invasive aortic valve replacement, lumbar stenosis (s/p surgery), LBBB, HTN, dizziness.    PT Comments    Pt received in bed, sluggish reports not feeling well. He required min assist sit to stand and min assist ambulation 20' with RW. Very slow, guarded gait. Pt positioned in recliner with feet elevated. He performed LE exercises. After sitting in recliner, pt with c/o double vision. Pt eating lunch in recliner at end of session.    Follow Up Recommendations  SNF;Supervision/Assistance - 24 hour     Equipment Recommendations  None recommended by PT    Recommendations for Other Services       Precautions / Restrictions Precautions Precautions: Fall    Mobility  Bed Mobility Overal bed mobility: Modified Independent             General bed mobility comments: +rail, increased time  Transfers Overall transfer level: Needs assistance Equipment used: Ambulation equipment used Transfers: Sit to/from [employer] Transfers Sit to Stand: Min assist Stand pivot transfers: Min assist       General transfer comment: increased time, assist to power up and stabilize balance  Ambulation/Gait Ambulation/Gait assistance: Min assist Gait Distance (Feet): 20 Feet Assistive device: Rolling walker (2 wheeled) Gait Pattern/deviations: Step-through pattern;Decreased stride length Gait velocity: very slow Gait velocity interpretation: <1.8 ft/sec, indicate of risk for recurrent falls General Gait Details: slow, sluggish gait; cues to stay on task   Stairs              Wheelchair Mobility    Modified Rankin (Stroke Patients Only)       Balance Overall balance assessment: Needs assistance Sitting-balance support: Feet supported;No upper extremity supported Sitting balance-Leahy Scale: Good     Standing balance support: No upper extremity supported;During functional activity Standing balance-Leahy Scale: Fair                              Cognition Arousal/Alertness: Awake/alert Behavior During Therapy: WFL for tasks assessed/performed Overall Cognitive Status: Within Functional Limits for tasks assessed                                        Exercises General Exercises - Lower Extremity Ankle Circles/Pumps: AROM;Both;10 reps Heel Slides: AROM;AAROM;Right;Left;10 reps Hip ABduction/ADduction: AROM;AAROM;Both;10 reps    General Comments General comments (skin integrity, edema, etc.): Pt very warm to touch, stating he is hot. Very sluggish. Stating "I just feel so sick." Mobilized on RA with SpO2 95%.      Pertinent Vitals/Pain Pain Assessment: Faces Faces Pain Scale: Hurts little more Pain Location: generalized Pain Descriptors / Indicators: Aching;Sore Pain Intervention(s): Monitored during session;Repositioned;Limited activity within patient's tolerance    Home Living                      Prior Function            PT Goals (current goals can now be found in the care plan section) Acute Rehab PT Goals Patient  Stated Goal: to feel better Progress towards PT goals: Progressing toward goals    Frequency    Min 3X/week      PT Plan Discharge plan needs to be updated    Co-evaluation              AM-PAC PT "6 Clicks" Mobility   Outcome Measure  Help needed turning from your back to your side while in a flat bed without using bedrails?: A Little Help needed moving from lying on your back to sitting on the side of a flat bed without using bedrails?: A Little Help needed  moving to and from a bed to a chair (including a wheelchair)?: A Little Help needed standing up from a chair using your arms (e.g., wheelchair or bedside chair)?: A Little Help needed to walk in hospital room?: A Little Help needed climbing 3-5 steps with a railing? : Total 6 Click Score: 16    End of Session   Activity Tolerance: Patient limited by fatigue;Patient limited by lethargy Patient left: in chair;with call bell/phone within reach Nurse Communication: Mobility status PT Visit Diagnosis: Muscle weakness (generalized) (M62.81);Difficulty in walking, not elsewhere classified (R26.2);Dizziness and giddiness (R42)     Time: [id number] PT Time Calculation (min) (ACUTE ONLY): 29 min  Charges:  $Gait Training: 8-22 mins $Therapeutic Exercise: 8-22 mins                     Lorrin Goodell, PT  Office # [phone] Pager [phone]    Lorriane Shire 05/01/2019, 2:26 PM

## 2019-05-01 NOTE — Progress Notes (Signed)
Pt son Gerry [place] updated on pt condition and plan of care.  Asked RN to page MD to see if can speak to pt son.  MD notified,will continue to monitor.

## 2019-05-01 NOTE — TOC Progression Note (Signed)
Transition of Care [facility]) - Progression Note    Patient Details  Name: Jaxen Mcateer MRN: [id number] Date of Birth: 1946/02/12  Transition of Care [facility]) CM/SW [place], [place] Phone Number: 05/01/2019, 10:48 AM  Clinical Narrative:     CSW called and spoke with the patient about SNF. He is agreeable to going to [facility].   CSW paged PT/OT and asked for the patient to be seen on Sunday for insurance authorization purposes.   CSW will continue to follow.   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

## 2019-05-01 NOTE — Progress Notes (Signed)
PROGRESS NOTE   Eric [name]  [id number]    DOB: Sep 25, 1945    DOA: 04/27/2019  PCP: Colon Branch, MD   I have briefly reviewed patients previous medical records in [facility].  Chief Complaint  Patient presents with  . Fall    Brief Narrative:  73 year old married male with PMH of HTN, HLD, hypothyroidism, LBBB, aortic stenosis s/p bioprosthetic aortic valve replacement 12/17/2017, dizziness, both patient and spouse recently tested Covid positive (04/23/2019), reportedly had a syncopal episode at home resulting in fall, hit to the head and subarachnoid hemorrhage.  Neurosurgery consulted.  Being managed conservatively/observation.  Improved.  Medically stable for discharge pending SNF availability.  Assessment & Plan:   Active Problems:   Subarachnoid hemorrhage (HCC)   Subarachnoid hemorrhage/left orbitofrontal contusion/nondisplaced left occipital fracture  Likely related to syncope, fall and head injury.  Neurosurgery consultation appreciated and indicate that appearance is typical for traumatic etiology.  CTA head was performed and aneurysm was ruled out.    Continue Keppra x1 week for seizure prophylaxis.  Aspirin to be held until follow-up with neurosurgery in their office in 4 weeks.  Has remained neurologically intact throughout his hospital stay.  Repeat CT 11/19 appreciated, detailed report as below.  Neurosurgery follow-up appreciated > Expected evolution of contusion and redistribution of subarachnoid hemorrhage.  Cleared for discharge from their perspective with outpatient follow-up in 4 weeks, complete 7 days course of Keppra for seizure prophylaxis and continue to hold aspirin until outpatient follow-up with them.  OT has evaluated and recommend SNF, social work involved.?  DC to SNF on 11/22.  Awaiting insurance authorization.  Syncope  Suspect due to dehydration and orthostatic hypotension.  On day of admission, BP dropped from 149/74 supine to  128/75 standing.  Patient reports that he was recently taken off of Aldactone and telmisartan  Telemetry shows sinus rhythm with known BBB morphology and no arrhythmias.  Discontinue telemetry.  TTE 11/18: LVEF [id number], grade 1 diastolic dysfunction, cannot completely exclude mild apical hypokinesis >may follow-up outpatient with primary cardiologist regarding further evaluation.  Carotid Doppler: 12/15/2017: b/l ICA stenosis 1-39%  Patient and son have been counseled that he should not drive for 6 months and he verbalized understanding.  Acute kidney injury  Creatinine had peaked to 1.37 on 11/16.  Likely related to diuretics, ARB and dehydration.  Creatinine has improved from 1.25-1.21 after gentle IV fluids.  DC IV fluids.  Encourage oral intake.  COVID-19 infection  Asymptomatic.  Not hypoxic.  Already on prednisone for rheumatoid arthritis, continue.  Monitor closely.  Remained stable.  Essential hypertension  Soft blood pressures.  Currently carvedilol 12.5 mg twice daily.  Discontinued amlodipine temporarily due to soft blood pressures.  Aldactone and telmisartan recently stopped by PCP.  Remained stable.  Hypothyroidism  Clinically euthyroid.  TSH 1.391.  Continue Synthroid.  Hyperlipidemia  Continue statins.  Rheumatoid arthritis, seronegative  No acute flare.  Continue prior home dose of prednisone 4 mg daily.  Patient was advised by his Rheumatologist to hold methotrexate at this time, will hold given recent Covid positive status.  Follow-up with rheumatologist as outpatient regarding timing of resumption.  Aortic stenosis, s/p bioprosthetic AV.  Uncomplicated right inguinal hernia  CT abdomen and pelvis with contrast 11/16 shows slightly enlarging fat-containing right inguinal hernia with portion of the right anterolateral bladder protruding through the hernial aperture.  Outpatient follow-up and surgical consultation as deemed necessary.  Bilobed  fusiform infrarenal abdominal aortic ectasia  Noted on CT abdomen  measuring 2.4 x 2.7 cm in diameter, at risk for aneurysmal development.  Recommend follow-up by ultrasound in 5 years per ACR consensus guidelines.  8 mm groundglass nodule in right lower lobe  Noted on CT abdomen.  Recommend follow-up with CT in 6 to 12 months to confirm persistence and then follow-up as per radiology recommendations.   DVT prophylaxis: SCD Code Status: Full Family Communication: I discussed with patient's son in detail on 11/21, updated care and answered questions. Disposition: DC to SNF pending bed availability and insurance authorization.   Consultants:  Neurosurgeon  Procedures:  None  Antimicrobials:  None   Subjective: Overall feels better.  Some pain behind his eyes but no visual abnormalities or double vision.  No headache or earache.  Denied nausea vomiting.  Some back and leg pains.  As per RN, no acute issues reported.  Objective:  Vitals:   04/30/19 1404 04/30/19 2252 05/01/19 0800 05/01/19 1300  BP: 114/72 138/68 (!) 142/84 127/73  Pulse: 83 84 74 70  Resp: 18 16    Temp: 98 F (36.7 C) 99.4 F (37.4 C) 98.9 F (37.2 C) 98.4 F (36.9 C)  TempSrc: Axillary  Oral Oral  SpO2: 100% 96%    Weight:      Height:        Examination:  General exam: Pleasant elderly male, moderately built and nourished lying comfortably propped up in bed without distress.  Oral mucosa moist. Respiratory system: Clear to auscultation.  No increased work of breathing. Cardiovascular system: S1 and S2 heard, RRR.  No JVD, murmurs or pedal edema.  Telemetry personally reviewed: Sinus rhythm with BBB morphology. Gastrointestinal system: Abdomen is nondistended, soft and nontender. No organomegaly or masses felt. Normal bowel sounds heard. Central nervous system: Alert and oriented.  No focal neurological deficits. Extremities: Symmetric 5 x 5 power. Skin: No rashes, lesions or ulcers Psychiatry:  Judgement and insight appear normal. Mood & affect appropriate.     Data Reviewed: I have personally reviewed following labs and imaging studies   CBC: Recent Labs  Lab 04/26/19 1138 04/27/19 2114  WBC 5.3 5.2  NEUTROABS 4.0 3.8  HGB 13.3 13.2  HCT 39.9 39.8  MCV 103.1* 101.3*  PLT 186 [id number]    Basic Metabolic Panel: Recent Labs  Lab 04/26/19 1138 04/27/19 2114 04/28/19 0127 04/28/19 0427 04/29/19 0554 04/30/19 0456  NA 137 135  --  136 139 138  K 4.7 4.1  --  4.1 4.0 4.0  CL 107 101  --  103 102 102  CO2 20* 24  --  21* 26 24  GLUCOSE 113* 100*  --  129* 89 88  BUN 21 19  --  14 12 12   CREATININE 1.37* 1.28*  --  1.15 1.25* 1.21  CALCIUM 8.9 9.0  --  8.6* 8.8* 8.8*  MG  --   --  1.9  --   --   --   PHOS  --   --  3.0  --   --   --     Liver Function Tests: Recent Labs  Lab 04/26/19 1146 04/27/19 2114 04/28/19 0427  AST 33 33 29  ALT 25 26 22   ALKPHOS 56 53 50  BILITOT 1.3* 1.2 1.0  PROT 6.6 6.9 6.2*  ALBUMIN 3.8 3.9 3.5    CBG: No results for input(s): GLUCAP in the last 168 hours.  Recent Results (from the past 240 hour(s))  Novel Coronavirus, NAA ([facility])     Status: Abnormal  Collection Time: 04/23/19 12:00 AM   Specimen: Nasopharyngeal(NP) swabs in vial transport medium   NASOPHARYNGE  TESTING  Result Value Ref Range Status   SARS-CoV-2, NAA Detected (A) Not Detected Final    Comment: Testing was performed using the cobas(R) SARS-CoV-2 test. This nucleic acid amplification test was developed and its performance characteristics determined by [employer]. Nucleic acid amplification tests include PCR and TMA. This test has not been FDA cleared or approved. This test has been authorized by FDA under an Emergency Use Authorization (EUA). This test is only authorized for the duration of time the declaration that circumstances exist justifying the authorization of the emergency use of in vitro diagnostic tests for detection of SARS-CoV-2  virus and/or diagnosis of COVID-19 infection under section 564(b)(1) of the Act, 21 U.S.C. [id number]) (1), unless the authorization is terminated or revoked sooner. When diagnostic testing is negative, the possibility of a false negative result should be considered in the context of a patient's recent exposures and the presence of clinical signs and symptoms consistent with COVID-19. An individual without symptoms  of COVID-19 and who is not shedding SARS-CoV-2 virus would expect to have a negative (not detected) result in this assay.       Radiology Studies: No results found.        Scheduled Meds: . carvedilol  12.5 mg Oral BID WC  . cholecalciferol  2,000 Units Oral Daily  . folic acid  2 mg Oral Daily  . levETIRAcetam  500 mg Oral BID  . levothyroxine  88 mcg Oral QAC breakfast  . predniSONE  4 mg Oral Q breakfast  . rosuvastatin  5 mg Oral Daily  . vitamin C  500 mg Oral Daily  . zinc sulfate  220 mg Oral Daily   Continuous Infusions:   LOS: 3 days     Vernell Leep, MD, [place], [facility]. [facility]  To contact the attending provider between 7A-7P or the covering provider during after hours 7P-7A, please log into the web site [email or web address] and access using universal [place] password for that web site. If you do not have the password, please call the hospital operator.  05/01/2019, 1:58 PM

## 2019-05-02 ENCOUNTER — Inpatient Hospital Stay (HOSPITAL_COMMUNITY): Payer: Medicare Other

## 2019-05-02 DIAGNOSIS — R509 Fever, unspecified: Secondary | ICD-10-CM

## 2019-05-02 DIAGNOSIS — U071 COVID-19: Secondary | ICD-10-CM

## 2019-05-02 LAB — CBC WITH DIFFERENTIAL/PLATELET
Abs Immature Granulocytes: 0.01 10*3/uL (ref 0.00–0.07)
Basophils Absolute: 0 10*3/uL (ref 0.0–0.1)
Basophils Relative: 0 %
Eosinophils Absolute: 0.1 10*3/uL (ref 0.0–0.5)
Eosinophils Relative: 1 %
HCT: 35.5 % — ABNORMAL LOW (ref 39.0–52.0)
Hemoglobin: 12.5 g/dL — ABNORMAL LOW (ref 13.0–17.0)
Immature Granulocytes: 0 %
Lymphocytes Relative: 13 %
Lymphs Abs: 0.6 10*3/uL — ABNORMAL LOW (ref 0.7–4.0)
MCH: 33.6 pg (ref 26.0–34.0)
MCHC: 35.2 g/dL (ref 30.0–36.0)
MCV: 95.4 fL (ref 80.0–100.0)
Monocytes Absolute: 0.6 10*3/uL (ref 0.1–1.0)
Monocytes Relative: 14 %
Neutro Abs: 3 10*3/uL (ref 1.7–7.7)
Neutrophils Relative %: 72 %
Platelets: 176 10*3/uL (ref 150–400)
RBC: 3.72 MIL/uL — ABNORMAL LOW (ref 4.22–5.81)
RDW: 12.6 % (ref 11.5–15.5)
WBC: 4.3 10*3/uL (ref 4.0–10.5)
nRBC: 0 % (ref 0.0–0.2)

## 2019-05-02 LAB — COMPREHENSIVE METABOLIC PANEL
ALT: 22 U/L (ref 0–44)
AST: 29 U/L (ref 15–41)
Albumin: 3.1 g/dL — ABNORMAL LOW (ref 3.5–5.0)
Alkaline Phosphatase: 50 U/L (ref 38–126)
Anion gap: 10 (ref 5–15)
BUN: 17 mg/dL (ref 8–23)
CO2: 24 mmol/L (ref 22–32)
Calcium: 8.7 mg/dL — ABNORMAL LOW (ref 8.9–10.3)
Chloride: 101 mmol/L (ref 98–111)
Creatinine, Ser: 1.13 mg/dL (ref 0.61–1.24)
GFR calc Af Amer: >60 mL/min (ref >60)
GFR calc non Af Amer: >60 mL/min (ref >60)
Glucose, Bld: 101 mg/dL — ABNORMAL HIGH (ref 70–99)
Potassium: 3.9 mmol/L (ref 3.5–5.1)
Sodium: 135 mmol/L (ref 135–145)
Total Bilirubin: 0.8 mg/dL (ref 0.3–1.2)
Total Protein: 5.8 g/dL — ABNORMAL LOW (ref 6.5–8.1)

## 2019-05-02 LAB — C-REACTIVE PROTEIN: CRP: 2.5 mg/dL — ABNORMAL HIGH (ref <1.0)

## 2019-05-02 LAB — PHOSPHORUS: Phosphorus: 3.2 mg/dL (ref 2.5–4.6)

## 2019-05-02 LAB — MAGNESIUM: Magnesium: 1.8 mg/dL (ref 1.7–2.4)

## 2019-05-02 LAB — D-DIMER, QUANTITATIVE: D-Dimer, Quant: 2.06 ug/mL-FEU — ABNORMAL HIGH (ref 0.00–0.50)

## 2019-05-02 LAB — FERRITIN: Ferritin: 562 ng/mL — ABNORMAL HIGH (ref 24–336)

## 2019-05-02 MED ORDER — SODIUM CHLORIDE 0.9 % IV BOLUS
500.0000 mL | Freq: Once | INTRAVENOUS | Status: AC
  Administered 2019-05-02: 500 mL via INTRAVENOUS

## 2019-05-02 MED ORDER — PREDNISONE 20 MG PO TABS
50.0000 mg | ORAL_TABLET | Freq: Once | ORAL | Status: AC
  Administered 2019-05-02: 50 mg via ORAL
  Filled 2019-05-02: qty 2

## 2019-05-02 NOTE — Progress Notes (Signed)
PROGRESS NOTE   Eric [name]  [id number]    DOB: 1946-01-01    DOA: 04/27/2019  PCP: Colon Branch, MD   I have briefly reviewed patients previous medical records in [facility].  Chief Complaint  Patient presents with  . Fall    Brief Narrative:  73 year old married male with PMH of HTN, HLD, hypothyroidism, LBBB, aortic stenosis s/p bioprosthetic aortic valve replacement 12/17/2017, dizziness, both patient and spouse recently tested Covid positive (04/23/2019), reportedly had a syncopal episode at home resulting in fall, hit to the head and subarachnoid hemorrhage.  Observed, stable on follow-up imaging with CT head, neurosurgery signed off.  Spiked temperature of 101 F overnight 11/21.  Assessment & Plan:   Active Problems:   Subarachnoid hemorrhage (HCC)   Subarachnoid hemorrhage/left orbitofrontal contusion/nondisplaced left occipital fracture  Likely related to syncope, fall and head injury.  Neurosurgery consultation appreciated and indicate that appearance is typical for traumatic etiology.  CTA head was performed and aneurysm was ruled out.    Continue Keppra x1 week for seizure prophylaxis.  Aspirin to be held until follow-up with neurosurgery in their office in 4 weeks.  Has remained neurologically intact throughout his hospital stay.  Repeat CT 11/19 appreciated, detailed report as below.  Neurosurgery follow-up appreciated > Expected evolution of contusion and redistribution of subarachnoid hemorrhage.  Cleared for discharge from their perspective with outpatient follow-up in 4 weeks, complete 7 days course of Keppra for seizure prophylaxis and continue to hold aspirin until outpatient follow-up with them.  OT has evaluated and recommend SNF, social work involved.?  DC to SNF on 11/23 pending clinical stability/noted new fever overnight.  Awaiting insurance authorization.  Syncope  Suspect due to dehydration and orthostatic hypotension.  On day of  admission, BP dropped from 149/74 supine to 128/75 standing.  Patient reports that he was recently taken off of Aldactone and telmisartan  Telemetry shows sinus rhythm with known BBB morphology and no arrhythmias.  Discontinue telemetry.  TTE 11/18: LVEF 123456, grade 1 diastolic dysfunction, cannot completely exclude mild apical hypokinesis >may follow-up outpatient with primary cardiologist regarding further evaluation.  Carotid Doppler: 12/15/2017: b/l ICA stenosis 1-39%  Patient and son have been counseled that he should not drive for 6 months and he verbalized understanding.  Acute kidney injury  Creatinine had peaked to 1.37 on 11/16.  Likely related to diuretics, ARB and dehydration.  Briefly hydrated with IV fluids.  Creatinine down to 1.13.  Acute kidney injury resolved.  Encouraged oral intake.  COVID-19 infection  Asymptomatic.  Not hypoxic.  Already on prednisone 4 mg daily for rheumatoid arthritis, continue.  Overall stable except spiked fever of 101 F overnight 11/21 of unclear etiology.?  Reactionary from intracranial bleed versus other etiologies.  Patient lacks any other symptoms to indicate source.  Urine microscopy on 11/18: Negative.  Repeated COVID-19 labs including inflammatory markers which are mildly elevated (ferritin 562 up from 397, CRP 2.5 up from less than 0.8, D-dimer 2.06)  Personally reviewed repeat chest x-ray 11/22: Mild atelectasis at the lung bases.  I discussed in detail with one of my colleagues at the [facility] who recommended that since patient had a fever spike but otherwise stable, a course of rapid prednisone taper starting with 60 mg daily and taper down by 10 mg every day down to prior home dose of prednisone 4 mg daily.  Hold off on remdesivir unless clinically worsens.  Monitor closely  Essential hypertension  Soft blood pressures.  Currently carvedilol 12.5 mg twice daily.  Discontinued amlodipine temporarily due to soft blood  pressures.  Aldactone and telmisartan recently stopped by PCP.  Remained stable.  Hypothyroidism  Clinically euthyroid.  TSH 1.391.  Continue Synthroid.  Hyperlipidemia  Continue statins.  Rheumatoid arthritis, seronegative  No acute flare.  Now on prednisone taper as noted above, will wean down to his home dose of prednisone 4 mg daily.  Patient was advised by his Rheumatologist to hold methotrexate at this time, will hold given recent Covid positive status.  Follow-up with rheumatologist as outpatient regarding timing of resumption.  Aortic stenosis, s/p bioprosthetic AV.  Uncomplicated right inguinal hernia  CT abdomen and pelvis with contrast 11/16 shows slightly enlarging fat-containing right inguinal hernia with portion of the right anterolateral bladder protruding through the hernial aperture.  Outpatient follow-up and surgical consultation as deemed necessary.  Bilobed fusiform infrarenal abdominal aortic ectasia  Noted on CT abdomen measuring 2.4 x 2.7 cm in diameter, at risk for aneurysmal development.  Recommend follow-up by ultrasound in 5 years per ACR consensus guidelines.  8 mm groundglass nodule in right lower lobe  Noted on CT abdomen.  Recommend follow-up with CT in 6 to 12 months to confirm persistence and then follow-up as per radiology recommendations.   DVT prophylaxis: SCD Code Status: Full Family Communication: I discussed with patient's son in detail on 11/21, updated care and answered questions. Disposition: DC to SNF pending bed availability and insurance authorization.   Consultants:  Neurosurgeon  Procedures:  None  Antimicrobials:  None   Subjective: Fever of 101 overnight.  Patient did not feel feverish.  States that he feels fine and wishes he could go home.  Minimal retro-orbital pain but improved compared to yesterday.  No headache, nausea, vomiting, cough, dyspnea, abdominal pain, diarrhea, urinary frequency or dysuria.    Objective:  Vitals:   05/02/19 0224 05/02/19 0300 05/02/19 0610 05/02/19 0754  BP:  105/60  119/74  Pulse:  79  74  Resp:  20    Temp: 99.5 F (37.5 C) (!) 101 F (38.3 C) 100 F (37.8 C) 99.2 F (37.3 C)  TempSrc: Oral Oral Oral Oral  SpO2:  92%  97%  Weight:      Height:        Examination:  General exam: Pleasant elderly male, moderately built and nourished lying comfortably propped up in bed without distress.  In fact he looks the best that he has looked since admission. Respiratory system: Clear to auscultation.  No increased work of breathing. Cardiovascular system: S1 and S2 heard, RRR.  No JVD, murmurs or pedal edema.  Stable. Gastrointestinal system: Abdomen is nondistended, soft and nontender. No organomegaly or masses felt. Normal bowel sounds heard. Central nervous system: Alert and oriented.  No focal neurological deficits. Extremities: Symmetric 5 x 5 power. Skin: No rashes, lesions or ulcers Psychiatry: Judgement and insight appear normal. Mood & affect appropriate.     Data Reviewed: I have personally reviewed following labs and imaging studies   CBC: Recent Labs  Lab 04/26/19 1138 04/27/19 2114 05/02/19 0757  WBC 5.3 5.2 4.3  NEUTROABS 4.0 3.8 3.0  HGB 13.3 13.2 12.5*  HCT 39.9 39.8 35.5*  MCV 103.1* 101.3* 95.4  PLT 186 219 0000000    Basic Metabolic Panel: Recent Labs  Lab 04/27/19 2114 04/28/19 0127 04/28/19 0427 04/29/19 0554 04/30/19 0456 05/02/19 0757  NA 135  --  136 139 138 135  K 4.1  --  4.1 4.0  4.0 3.9  CL 101  --  103 102 102 101  CO2 24  --  21* 26 24 24   GLUCOSE 100*  --  129* 89 88 101*  BUN 19  --  14 12 12 17   CREATININE 1.28*  --  1.15 1.25* 1.21 1.13  CALCIUM 9.0  --  8.6* 8.8* 8.8* 8.7*  MG  --  1.9  --   --   --  1.8  PHOS  --  3.0  --   --   --  3.2    Liver Function Tests: Recent Labs  Lab 04/26/19 1146 04/27/19 2114 04/28/19 0427 05/02/19 0757  AST 33 33 29 29  ALT 25 26 22 22   ALKPHOS 56 53 50 50   BILITOT 1.3* 1.2 1.0 0.8  PROT 6.6 6.9 6.2* 5.8*  ALBUMIN 3.8 3.9 3.5 3.1*    CBG: No results for input(s): GLUCAP in the last 168 hours.  Recent Results (from the past 240 hour(s))  Novel Coronavirus, NAA ([facility])     Status: Abnormal   Collection Time: 04/23/19 12:00 AM   Specimen: Nasopharyngeal(NP) swabs in vial transport medium   NASOPHARYNGE  TESTING  Result Value Ref Range Status   SARS-CoV-2, NAA Detected (A) Not Detected Final    Comment: Testing was performed using the cobas(R) SARS-CoV-2 test. This nucleic acid amplification test was developed and its performance characteristics determined by [employer]. Nucleic acid amplification tests include PCR and TMA. This test has not been FDA cleared or approved. This test has been authorized by FDA under an Emergency Use Authorization (EUA). This test is only authorized for the duration of time the declaration that circumstances exist justifying the authorization of the emergency use of in vitro diagnostic tests for detection of SARS-CoV-2 virus and/or diagnosis of COVID-19 infection under section 564(b)(1) of the Act, 21 U.S.C. [id number]) (1), unless the authorization is terminated or revoked sooner. When diagnostic testing is negative, the possibility of a false negative result should be considered in the context of a patient's recent exposures and the presence of clinical signs and symptoms consistent with COVID-19. An individual without symptoms  of COVID-19 and who is not shedding SARS-CoV-2 virus would expect to have a negative (not detected) result in this assay.       Radiology Studies: Portable Chest 1 View  Result Date: 05/02/2019 CLINICAL DATA:  Fever EXAM: PORTABLE CHEST 1 VIEW COMPARISON:  04/27/2019 FINDINGS: Normal heart size. Aortic valve replacement with changes of minimally invasive surgery. Mildly low volume chest with mild atelectasis. There is no edema, consolidation, effusion, or  pneumothorax. IMPRESSION: Mild atelectasis at the lung bases. Electronically Signed   By: Monte Fantasia M.D.   On: 05/02/2019 08:43          Scheduled Meds: . carvedilol  12.5 mg Oral BID WC  . cholecalciferol  2,000 Units Oral Daily  . folic acid  2 mg Oral Daily  . levETIRAcetam  500 mg Oral BID  . levothyroxine  88 mcg Oral QAC breakfast  . rosuvastatin  5 mg Oral Daily  . vitamin C  500 mg Oral Daily  . zinc sulfate  220 mg Oral Daily   Continuous Infusions:   LOS: 4 days     Vernell Leep, MD, [place], [facility]. [facility]  To contact the attending provider between 7A-7P or the covering provider during after hours 7P-7A, please log into the web site [email or web address] and access using universal [place] password for that web  site. If you do not have the password, please call the hospital operator.  05/02/2019, 1:08 PM

## 2019-05-02 NOTE — Plan of Care (Signed)
Patient had temperature of 101. Acetaminophen and bolus was given as ordered.

## 2019-05-02 NOTE — Plan of Care (Signed)
  Problem: Education: Goal: Knowledge of risk factors and measures for prevention of condition will improve Outcome: Progressing   Problem: Coping: Goal: Psychosocial and spiritual needs will be supported Outcome: Progressing   Problem: Respiratory: Goal: Will maintain a patent airway Outcome: Progressing Goal: Complications related to the disease process, condition or treatment will be avoided or minimized Outcome: Progressing   

## 2019-05-02 NOTE — Progress Notes (Signed)
Occupational Therapy Treatment Patient Details Name: Eric [name] MRN: [id number] DOB: [birth year] Today's Date: 05/02/2019    History of present illness 73 y.o. male admitted on 04/27/19 to [facility] due to fall with resultant SAH, L orbitofrontal contusion, non-displaced occipital fx, syncope related to dehydration and orthostatic BPs, acute kidney injury, COVID 19 infection.  Pt with other significant PMH of minimally invasive aortic valve replacement, lumbar stenosis (s/p surgery), LBBB, HTN, dizziness.   OT comments  Pt continues to present with decreased balance and activity tolerance. Pt requiring Max A for donning socks at EOB. Pt performing functional mobility to bathroom with Min Guard A and RW requiring several standing rest breaks for fatigue and pain. Pt presenting with decreased problem solving and awareness requiring increased time and cues during ADLs. Continue to recommend dc to SNF and will continue to follow acutely as admitted.     Follow Up Recommendations  SNF;Supervision/Assistance - 24 hour    Equipment Recommendations  None recommended by OT(Pt has appropriate DME)    Recommendations for Other Services      Precautions / Restrictions Precautions Precautions: Fall Precaution Comments: nausea Restrictions Weight Bearing Restrictions: No       Mobility Bed Mobility Overal bed mobility: Needs Assistance Bed Mobility: Supine to Sit     Supine to sit: Mod assist     General bed mobility comments: Mod A to hold OT's hand and pull into upright posture  Transfers Overall transfer level: Needs assistance Equipment used: Ambulation equipment used Transfers: Sit to/from [employer] Transfers Sit to Stand: Min assist         General transfer comment: increased time, assist to power up and stabilize balance    Balance Overall balance assessment: Needs assistance Sitting-balance support: Feet supported;No upper extremity supported Sitting balance-Leahy  Scale: Good     Standing balance support: No upper extremity supported;During functional activity Standing balance-Leahy Scale: Fair Standing balance comment: Able to maintain static standing while at sink                           ADL either performed or assessed with clinical judgement   ADL Overall ADL's : Needs assistance/impaired     Grooming: Wash/dry hands;Min guard;Standing Grooming Details (indicate cue type and reason): Min Guard A for safety. Requiring Min cues for sequencing             Lower Body Dressing: Maximal assistance;Sit to/from stand Lower Body Dressing Details (indicate cue type and reason): Max A for donning socks as pt presents with increased pain and fatigue. Pt attempting to don socks and repeating "I just can't do it" Toilet Transfer: Minimal assistance;RW;Ambulation Toilet Transfer Details (indicate cue type and reason): Min A for sit<>Stand and Min Guard A for balance while standing at toilet for urination.  Toileting- Clothing Manipulation and Hygiene: Minimal assistance;Sit to/from stand       Functional mobility during ADLs: Min guard;Cueing for safety;Rolling walker;Cueing for sequencing General ADL Comments: Pt presenting with poor balance, cognition, and activity tolerance     Vision   Vision Assessment?: Vision impaired- to be further tested in functional context   Perception     Praxis      Cognition Arousal/Alertness: Awake/alert Behavior During Therapy: WFL for tasks assessed/performed Overall Cognitive Status: Impaired/Different from baseline Area of Impairment: Following commands;Safety/judgement;Awareness                       Following  Commands: Follows one step commands with increased time Safety/Judgement: Decreased awareness of safety;Decreased awareness of deficits Awareness: Intellectual   General Comments: Pt requiring increased time and cues. Pt with increased attention to pain and perseverating  on topic. Pt also internally distracted and verablizing topics that were not being discussed.        Exercises     Shoulder Instructions       General Comments VSS    Pertinent Vitals/ Pain       Pain Assessment: Faces Faces Pain Scale: Hurts little more Pain Location: generalized Pain Descriptors / Indicators: Aching;Sore Pain Intervention(s): Monitored during session;Limited activity within patient's tolerance;Repositioned  Home Living                                          Prior Functioning/Environment              Frequency  Min 2X/week        Progress Toward Goals  OT Goals(current goals can now be found in the care plan section)  Progress towards OT goals: Progressing toward goals  Acute Rehab OT Goals Patient Stated Goal: to feel better OT Goal Formulation: With patient Time For Goal Achievement: 05/14/19 Potential to Achieve Goals: Good  Plan Discharge plan remains appropriate    Co-evaluation                 AM-PAC OT "6 Clicks" Daily Activity     Outcome Measure   Help from another person eating meals?: A Little Help from another person taking care of personal grooming?: A Little Help from another person toileting, which includes using toliet, bedpan, or urinal?: A Lot Help from another person bathing (including washing, rinsing, drying)?: A Lot Help from another person to put on and taking off regular upper body clothing?: A Little Help from another person to put on and taking off regular lower body clothing?: A Lot 6 Click Score: 15    End of Session Equipment Utilized During Treatment: Rolling walker  OT Visit Diagnosis: Unsteadiness on feet (R26.81);Other abnormalities of gait and mobility (R26.89);Muscle weakness (generalized) (M62.81);History of falling (Z91.81);Dizziness and giddiness (R42)   Activity Tolerance Patient tolerated treatment well   Patient Left in chair;with call bell/phone within reach;with  chair alarm set   Nurse Communication Mobility status;Precautions        Time: [id number] OT Time Calculation (min): 30 min  Charges: OT General Charges $OT Visit: 1 Visit OT Treatments $Self Care/Home Management : 23-37 mins  [place], OTR/L Acute Rehab Pager: [phone] Office: [place] 05/02/2019, 4:41 PM

## 2019-05-03 ENCOUNTER — Telehealth: Payer: Self-pay

## 2019-05-03 LAB — PHOSPHORUS: Phosphorus: 3.6 mg/dL (ref 2.5–4.6)

## 2019-05-03 LAB — COMPREHENSIVE METABOLIC PANEL
ALT: 23 U/L (ref 0–44)
AST: 25 U/L (ref 15–41)
Albumin: 3.1 g/dL — ABNORMAL LOW (ref 3.5–5.0)
Alkaline Phosphatase: 48 U/L (ref 38–126)
Anion gap: 11 (ref 5–15)
BUN: 22 mg/dL (ref 8–23)
CO2: 24 mmol/L (ref 22–32)
Calcium: 8.8 mg/dL — ABNORMAL LOW (ref 8.9–10.3)
Chloride: 101 mmol/L (ref 98–111)
Creatinine, Ser: 1.28 mg/dL — ABNORMAL HIGH (ref 0.61–1.24)
GFR calc Af Amer: >60 mL/min (ref >60)
GFR calc non Af Amer: 55 mL/min — ABNORMAL LOW (ref >60)
Glucose, Bld: 124 mg/dL — ABNORMAL HIGH (ref 70–99)
Potassium: 4.2 mmol/L (ref 3.5–5.1)
Sodium: 136 mmol/L (ref 135–145)
Total Bilirubin: 0.9 mg/dL (ref 0.3–1.2)
Total Protein: 5.9 g/dL — ABNORMAL LOW (ref 6.5–8.1)

## 2019-05-03 LAB — CBC WITH DIFFERENTIAL/PLATELET
Abs Immature Granulocytes: 0.02 10*3/uL (ref 0.00–0.07)
Basophils Absolute: 0 10*3/uL (ref 0.0–0.1)
Basophils Relative: 0 %
Eosinophils Absolute: 0 10*3/uL (ref 0.0–0.5)
Eosinophils Relative: 0 %
HCT: 34.4 % — ABNORMAL LOW (ref 39.0–52.0)
Hemoglobin: 12.1 g/dL — ABNORMAL LOW (ref 13.0–17.0)
Immature Granulocytes: 0 %
Lymphocytes Relative: 12 %
Lymphs Abs: 0.6 10*3/uL — ABNORMAL LOW (ref 0.7–4.0)
MCH: 34.3 pg — ABNORMAL HIGH (ref 26.0–34.0)
MCHC: 35.2 g/dL (ref 30.0–36.0)
MCV: 97.5 fL (ref 80.0–100.0)
Monocytes Absolute: 0.6 10*3/uL (ref 0.1–1.0)
Monocytes Relative: 13 %
Neutro Abs: 3.4 10*3/uL (ref 1.7–7.7)
Neutrophils Relative %: 75 %
Platelets: 174 10*3/uL (ref 150–400)
RBC: 3.53 MIL/uL — ABNORMAL LOW (ref 4.22–5.81)
RDW: 12.7 % (ref 11.5–15.5)
WBC: 4.6 10*3/uL (ref 4.0–10.5)
nRBC: 0 % (ref 0.0–0.2)

## 2019-05-03 LAB — C-REACTIVE PROTEIN: CRP: 3 mg/dL — ABNORMAL HIGH (ref <1.0)

## 2019-05-03 LAB — MAGNESIUM: Magnesium: 2.1 mg/dL (ref 1.7–2.4)

## 2019-05-03 LAB — D-DIMER, QUANTITATIVE: D-Dimer, Quant: 2.97 ug/mL-FEU — ABNORMAL HIGH (ref 0.00–0.50)

## 2019-05-03 LAB — FERRITIN: Ferritin: 469 ng/mL — ABNORMAL HIGH (ref 24–336)

## 2019-05-03 MED ORDER — PREDNISONE 1 MG PO TABS: 4.0000 mg | ORAL_TABLET | Freq: Every day | ORAL | Status: DC

## 2019-05-03 MED ORDER — PREDNISONE 20 MG PO TABS: 20.0000 mg | ORAL_TABLET | Freq: Every day | ORAL | Status: DC

## 2019-05-03 MED ORDER — PREDNISONE 10 MG PO TABS: ORAL_TABLET | ORAL | Status: DC

## 2019-05-03 MED ORDER — FOLIC ACID 1 MG PO TABS: 1.0000 mg | ORAL_TABLET | Freq: Every day | ORAL | Status: DC

## 2019-05-03 MED ORDER — ZINC SULFATE 220 (50 ZN) MG PO CAPS: 220.0000 mg | ORAL_CAPSULE | Freq: Every day | ORAL | Status: DC

## 2019-05-03 MED ORDER — PREDNISONE 20 MG PO TABS: 40.0000 mg | ORAL_TABLET | Freq: Every day | ORAL | Status: DC

## 2019-05-03 MED ORDER — ASCORBIC ACID 500 MG PO TABS: 500.0000 mg | ORAL_TABLET | Freq: Every day | ORAL | Status: DC

## 2019-05-03 MED ORDER — LEVETIRACETAM 500 MG PO TABS: 500.0000 mg | ORAL_TABLET | Freq: Two times a day (BID) | ORAL | Status: DC

## 2019-05-03 MED ORDER — PREDNISONE 10 MG PO TABS: 10.0000 mg | ORAL_TABLET | Freq: Every day | ORAL | Status: DC

## 2019-05-03 MED ORDER — PREDNISONE 20 MG PO TABS
40.0000 mg | ORAL_TABLET | Freq: Once | ORAL | Status: AC
  Administered 2019-05-03: 40 mg via ORAL
  Filled 2019-05-03: qty 2

## 2019-05-03 MED ORDER — ACETAMINOPHEN 325 MG PO TABS: 650.0000 mg | ORAL_TABLET | Freq: Four times a day (QID) | ORAL | Status: DC | PRN

## 2019-05-03 MED ORDER — PREDNISONE 20 MG PO TABS: 30.0000 mg | ORAL_TABLET | Freq: Every day | ORAL | Status: DC

## 2019-05-03 NOTE — Progress Notes (Signed)
Attempted to call report to [facility]. RN said he was busy and took this RN's number to call back.

## 2019-05-03 NOTE — Telephone Encounter (Signed)
I spoke with Eric [name], after I reviewed the chart. He said he was very confused about what is going on with his father. In simple terms I explained him that his father had a ICB, traumatic, he is still is very unsteady, fatigues easily and has poor activity tolerance. The plan is to discharge him to SNF and the goal is to get him back to where he was few months ago although I cannot guarantee he will be back to normal after this incident.

## 2019-05-03 NOTE — Discharge Summary (Signed)
Physician Discharge Summary  Eric [name] [id number] DOB: 08/24/1945  PCP: Colon Branch, MD  Admitted from: Home Discharged to: [facility] date: 04/27/2019 Discharge date: 05/03/2019  Recommendations for Outpatient Follow-up:    Contact information for follow-up providers    MD at SNF. Schedule an appointment as soon as possible for a visit.   Why: To be seen in 2 to 3 days with repeat labs (CBC & CMP).       Colon Branch, MD. Schedule an appointment as soon as possible for a visit.   Specialty: Internal Medicine Why: Upon discharge from SNF. Contact information: [address] [place] [address] [phone]        Eric Priest, MD .   Specialty: Cardiology Contact information: [place] [address] [phone]        Eric [name], [place]. Schedule an appointment as soon as possible for a visit in 73 week(s).   Specialty: Physician Assistant Why: For follow-up of subarachnoid hemorrhage.  To be seen with repeat CT head.  Do not take aspirin until this office visit. Contact information: [place] [address] [phone]        Eric Merino, MD. Schedule an appointment as soon as possible for a visit.   Specialty: Rheumatology Why: Follow-up regarding resumption of methotrexate post Covid 19 illness Contact information: [address] [place] [address] [phone]            Contact information for after-discharge care    Destination    [facility] .   Service: Skilled Nursing Contact information: [name]: None Equipment/Devices: TBD at [facility]  Discharge Condition: Improved and stable CODE STATUS: Full Diet recommendation: Heart healthy diet.  Discharge Diagnoses:  Active Problems:   Subarachnoid hemorrhage [facility])   Brief  Summary: 73 year old married male with PMH of HTN, HLD, hypothyroidism, LBBB, aortic stenosis s/p bioprosthetic aortic valve replacement 12/17/2017, dizziness, both patient and spouse recently tested Covid positive (04/23/2019), reportedly had a syncopal episode at home resulting in fall, hit to the head and subarachnoid hemorrhage.  Observed, stable on follow-up imaging with CT head, neurosurgery signed off.  Spiked temperature of 101 F overnight 11/21.  Assessment & Plan:  Subarachnoid hemorrhage/left orbitofrontal contusion/nondisplaced left occipital fracture  Likely related to syncope, fall and head injury.  Neurosurgery consultation appreciated and indicate that appearance is typical for traumatic etiology.  CTA head was performed and aneurysm was ruled out.    Continue Keppra x1 week for seizure prophylaxis, completes after 05/04/2019 doses.  Aspirin to be held until follow-up with neurosurgery in their office in 4 weeks.  Has remained neurologically intact throughout his hospital stay.  Repeat CT 11/19 appreciated, detailed report as below.  Neurosurgery follow-up appreciated > Expected evolution of contusion and redistribution of subarachnoid hemorrhage.  Cleared for discharge from their perspective with outpatient follow-up in 4 weeks, complete 7 days course of Keppra for seizure prophylaxis and continue to hold aspirin until outpatient follow-up with them.  Therapies evaluated and continue to recommend SNF for short-term rehab.  Syncope  Suspect due to dehydration and orthostatic hypotension.  On day of admission, BP dropped from 149/74 supine to 128/75 standing.  Patient reports that he was recently taken off of Aldactone  and telmisartan  Telemetry shows sinus rhythm with known BBB morphology and no arrhythmias.   TTE 11/18: LVEF [id number], grade 1 diastolic dysfunction, cannot completely exclude mild apical hypokinesis >may follow-up outpatient with primary cardiologist  regarding further evaluation.  Carotid Doppler: 12/15/2017: b/l ICA stenosis 1-39%  Patient and son have been counseled that he should not drive for 6 months and he verbalized understanding.  Acute kidney injury  Creatinine had peaked to 1.37 on 11/16.  Likely related to diuretics, ARB and dehydration.  Briefly hydrated with IV fluids.  Creatinine has improved.  Continues to mildly fluctuate.  His baseline creatinine may be in the 1.1-1.2 range.  1.28 today.  Follow BMP closely as outpatient.  Encourage ad lib. oral fluid intake.  COVID-19 infection  Asymptomatic.  Not hypoxic.  Already on prednisone 4 mg daily for rheumatoid arthritis, continue.  Overall stable except spiked fever of 101 F overnight 11/21 of unclear etiology.?  Reactionary from intracranial bleed versus other etiologies.  Patient lacks any other symptoms to indicate source.  Urine microscopy on 11/18: Negative.  Repeated COVID-19 labs including inflammatory markers which were mildly elevated (ferritin 562 up from 397, CRP 2.5 up from less than 0.8, D-dimer 2.06)  Personally reviewed repeat chest x-ray 11/22: Mild atelectasis at the lung bases.  I discussed in detail with one of my colleagues at the [facility] on 11/22 who recommended that since patient had a fever spike but otherwise stable, a course of rapid prednisone taper starting with 60 mg daily and taper down by 10 mg every day down to prior home dose of prednisone 4 mg daily.  Hold off on remdesivir unless clinically worsens.    No recurrence of fever for greater than 24 hours.  Feels well.  Inflammatory markers trend unremarkable.  Patient and family advised to seek immediate medical attention if he has recurrence of high fevers, dyspnea, hypoxia or any worsening from current baseline  Essential hypertension  Soft blood pressures and hence amlodipine discontinued.  Currently carvedilol 12.5 mg twice daily.  Aldactone and telmisartan recently  stopped by PCP.    Controlled on carvedilol alone.  Hypothyroidism  Clinically euthyroid.  TSH 1.391.  Continue Synthroid.  Hyperlipidemia  Continue statins.  Rheumatoid arthritis, seronegative  No acute flare.  Now on prednisone taper as noted above, will wean down to his home dose of prednisone 4 mg daily.  Patient was advised by his Rheumatologist to hold methotrexate at this time, will hold given recent Covid positive status.  Follow-up with rheumatologist as outpatient regarding timing of resumption.   After prednisone taper, patient is to resume his home dose of prednisone 4 mg daily from 05/07/2019.  Aortic stenosis, s/p bioprosthetic AV.  Uncomplicated right inguinal hernia  CT abdomen and pelvis with contrast 11/16 shows slightly enlarging fat-containing right inguinal hernia with portion of the right anterolateral bladder protruding through the hernial aperture.  Outpatient follow-up and surgical consultation as deemed necessary.  Bilobed fusiform infrarenal abdominal aortic ectasia  Noted on CT abdomen measuring 2.4 x 2.7 cm in diameter, at risk for aneurysmal development.  Recommend follow-up by ultrasound in 5 years per ACR consensus guidelines.  8 mm groundglass nodule in right lower lobe  Noted on CT abdomen.  Recommend follow-up with CT in 6 to 12 months to confirm persistence and then follow-up as per radiology recommendations.    Consultants:  Neurosurgeon  Procedures:  None   Discharge Instructions  Discharge Instructions    Call MD for:  Complete by: As directed    Passing out or feeling like passing out.   Call MD for:  difficulty breathing, headache or visual disturbances   Complete by: As directed    Call MD for:  extreme fatigue   Complete by: As directed    Call MD for:  persistant dizziness or light-headedness   Complete by: As directed    Call MD for:  persistant nausea and vomiting   Complete by: As directed    Call MD  for:  severe uncontrolled pain   Complete by: As directed    Call MD for:  temperature >100.4   Complete by: As directed    Diet - low sodium heart healthy   Complete by: As directed    Discharge instructions   Complete by: As directed    Patient also counseled regarding self-isolation/quarantine for 20 days post discharge due to Covid 19 diagnosis.   Driving Restrictions   Complete by: As directed    No driving for 6 months.   Increase activity slowly   Complete by: As directed        Medication List    STOP taking these medications   amLODipine 5 MG tablet Commonly known as: NORVASC   aspirin EC 81 MG tablet   methotrexate 2.5 MG tablet Commonly known as: RHEUMATREX   spironolactone 25 MG tablet Commonly known as: ALDACTONE   telmisartan 40 MG tablet Commonly known as: MICARDIS     TAKE these medications   acetaminophen 325 MG tablet Commonly known as: TYLENOL Take 2 tablets (650 mg total) by mouth every 6 (six) hours as needed for mild pain, moderate pain, fever or headache.   ascorbic acid 500 MG tablet Commonly known as: VITAMIN C Take 1 tablet (500 mg total) by mouth daily. Start taking on: May 04, 2019   carvedilol 12.5 MG tablet Commonly known as: COREG TAKE 1 TABLET (12.5 MG TOTAL) BY MOUTH 2 (TWO) TIMES DAILY WITH A MEAL.   folic acid 1 MG tablet Commonly known as: FOLVITE Take 1 tablet (1 mg total) by mouth daily.   levETIRAcetam 500 MG tablet Commonly known as: KEPPRA Take 1 tablet (500 mg total) by mouth 2 (two) times daily. Discontinue after 05/04/2019 doses.   levothyroxine 88 MCG tablet Commonly known as: SYNTHROID Take 1 tablet (88 mcg total) by mouth daily before breakfast.   predniSONE 10 MG tablet Commonly known as: DELTASONE Take 3 tabs (30 mg total) on 05/04/2019, 2 tablets (20 mg total) on 05/05/2019, 1 tablet (10 mg total) on 05/06/2019.  After completion of taper, resume prior home dose of maintenance prednisone 4 mg daily  from 11/27. Start taking on: May 04, 2019 What changed: You were already taking a medication with the same name, and this prescription was added. Make sure you understand how and when to take each.   predniSONE 1 MG tablet Commonly known as: DELTASONE Take 4 tablets (4 mg total) by mouth daily with breakfast. This is his prior home dose of maintenance prednisone.  This has to be resumed on 05/07/2019 after completion of rapid prednisone taper. Start taking on: May 07, 2019 What changed:   additional instructions  These instructions start on May 07, 2019. If you are unsure what to do until then, ask your doctor or other care provider.   rosuvastatin 5 MG tablet Commonly known as: CRESTOR Take 1 tablet (5 mg total) by mouth daily.   VITAMIN D-3 PO Take 2,000 Units by mouth daily.  zinc sulfate 220 (50 Zn) MG capsule Take 1 capsule (220 mg total) by mouth daily. Start taking on: May 04, 2019      Allergies  Allergen Reactions  . Atorvastatin Other (See Comments)    REACTION: myalgia  . Ezetimibe Other (See Comments)    REACTION: myalgia      Procedures/Studies: Ct Angio Head W Or Wo Contrast  Result Date: 04/28/2019 CLINICAL DATA:  Traumatic subarachnoid hemorrhage and skull fracture. EXAM: CT ANGIOGRAPHY HEAD TECHNIQUE: Multidetector CT imaging of the head was performed using the standard protocol during bolus administration of intravenous contrast. Multiplanar CT image reconstructions and MIPs were obtained to evaluate the vascular anatomy. CONTRAST:  13mL OMNIPAQUE IOHEXOL 350 MG/ML SOLN COMPARISON:  Head CT from yesterday FINDINGS: CTA HEAD Anterior circulation: Vessels are smooth and widely patent. No evidence of dissection at the skull base or upper neck. Negative for aneurysm or vascular malformation. No significant atheromatous changes or stenosis. The ICAs are tortuous in the neck. Posterior circulation: Left dominant vertebral artery. No evidence  of dissection and no thrombosis or aneurysm. Venous sinuses: Limited assessment due to contrast timing with no evidence of thrombosis, including the left transverse sinus which is crossed by a fracture. Anatomic variants: None significant. A noncontrast phase was not repeated given the recent noncontrast scan. No evidence of progressive subarachnoid hemorrhage or inferior frontal contusion. IMPRESSION: No evidence of major vessel injury.  Negative for cerebral aneurysm. Electronically Signed   By: Monte Fantasia M.D.   On: 04/28/2019 05:54   Ct Head Wo Contrast  Result Date: 04/29/2019 CLINICAL DATA:  Subarachnoid hemorrhage, proven, follow-up. EXAM: CT HEAD WITHOUT CONTRAST TECHNIQUE: Contiguous axial images were obtained from the base of the skull through the vertex without intravenous contrast. COMPARISON:  CT angiogram head 04/28/2019, non-contrast CT head 04/27/2019 FINDINGS: Brain: A hemorrhagic contusion within the anteroinferior left frontal lobe has not significantly changed with hyperdense parenchymal hemorrhage measuring 2.3 x 1.0 cm in transaxial dimensions on the current examination. There has been interval blooming of hemorrhagic contusion within the anteroinferior right frontal lobe with a small focus of hemorrhage at this site measuring 1 cm. Scattered small volume subarachnoid hemorrhage is again seen, greatest overlying the anterior frontal lobes and within the sylvian fissures. There is now a small amount of hemorrhage within the occipital horns of the lateral ventricles, which may be secondary to interval redistribution. No evidence of hydrocephalus. No midline shift. Vascular: No hyperdense vessel. Skull: Redemonstrated nondisplaced left occipital calvarial fracture Sinuses/Orbits: Visualized orbits demonstrate no acute abnormality. Redemonstrated ethmoid sinus mucosal thickening with partial opacification of right ethmoid air cells. IMPRESSION: 1. A hemorrhagic parenchymal contusion  within the anteroinferior left frontal lobe has not significantly changed. 2. Interval blooming of hemorrhagic contusion within the anteroinferior right frontal lobe, with a 1 cm acute parenchymal hemorrhage at this site. 3. Redemonstrated scattered small volume subarachnoid hemorrhage greatest along frontal lobes and within the sylvian fissures. New small volume intraventricular hemorrhage within the occipital horns may be secondary to interval redistribution. No hydrocephalus. Electronically Signed   By: Kellie Simmering DO   On: 04/29/2019 13:59   Ct Head Wo Contrast  Result Date: 04/27/2019 CLINICAL DATA:  Fall, hematoma on back of head. EXAM: CT HEAD WITHOUT CONTRAST CT CERVICAL SPINE WITHOUT CONTRAST TECHNIQUE: Multidetector CT imaging of the head and cervical spine was performed following the standard protocol without intravenous contrast. Multiplanar CT image reconstructions of the cervical spine were also generated. COMPARISON:  None. FINDINGS: CT HEAD FINDINGS  Brain: there is subarachnoid blood noted over both frontal lobes, along the anterior falx and in the both sylvian fissures. Intracerebral contusion noted within the inferior anterior left frontal lobe measuring 2.1 x 1.1 cm. No midline shift. No hydrocephalus. Vascular: No hyperdense vessel or unexpected calcification. Skull: There is a nondisplaced left occipital bone fracture. Sinuses/Orbits: No acute finding. Other: Soft tissue swelling in the left posterior scalp. CT CERVICAL SPINE FINDINGS Alignment: No subluxation Skull base and vertebrae: No acute fracture. No primary bone lesion or focal pathologic process. Soft tissues and spinal canal: No prevertebral fluid or swelling. No visible canal hematoma. Disc levels:  Diffuse degenerative disc and facet disease. Upper chest: No acute findings Other: None IMPRESSION: Subarachnoid hemorrhage overlying the frontal lobes, within the sylvian fissures, and along the anterior falx. 2.1 x 1.1 cm  intracerebral hemorrhage/contusion in the inferior left frontal lobe. No midline shift. Nondisplaced left occipital bone fracture Critical Value/emergent results were called by telephone at the time of interpretation on 04/27/2019 at 11:38 pm to providerDr. Leonette Monarch, Who verbally acknowledged these results. Electronically Signed   By: Rolm Baptise M.D.   On: 04/27/2019 23:47   Ct Cervical Spine Wo Contrast  Result Date: 04/27/2019 CLINICAL DATA:  Fall, hematoma on back of head. EXAM: CT HEAD WITHOUT CONTRAST CT CERVICAL SPINE WITHOUT CONTRAST TECHNIQUE: Multidetector CT imaging of the head and cervical spine was performed following the standard protocol without intravenous contrast. Multiplanar CT image reconstructions of the cervical spine were also generated. COMPARISON:  None. FINDINGS: CT HEAD FINDINGS Brain: there is subarachnoid blood noted over both frontal lobes, along the anterior falx and in the both sylvian fissures. Intracerebral contusion noted within the inferior anterior left frontal lobe measuring 2.1 x 1.1 cm. No midline shift. No hydrocephalus. Vascular: No hyperdense vessel or unexpected calcification. Skull: There is a nondisplaced left occipital bone fracture. Sinuses/Orbits: No acute finding. Other: Soft tissue swelling in the left posterior scalp. CT CERVICAL SPINE FINDINGS Alignment: No subluxation Skull base and vertebrae: No acute fracture. No primary bone lesion or focal pathologic process. Soft tissues and spinal canal: No prevertebral fluid or swelling. No visible canal hematoma. Disc levels:  Diffuse degenerative disc and facet disease. Upper chest: No acute findings Other: None IMPRESSION: Subarachnoid hemorrhage overlying the frontal lobes, within the sylvian fissures, and along the anterior falx. 2.1 x 1.1 cm intracerebral hemorrhage/contusion in the inferior left frontal lobe. No midline shift. Nondisplaced left occipital bone fracture Critical Value/emergent results were called  by telephone at the time of interpretation on 04/27/2019 at 11:38 pm to providerDr. Leonette Monarch, Who verbally acknowledged these results. Electronically Signed   By: Rolm Baptise M.D.   On: 04/27/2019 23:47   Ct Abdomen Pelvis W Contrast  Result Date: 04/26/2019 CLINICAL DATA:  Abdominal distension, lightheadedness with fall EXAM: CT ABDOMEN AND PELVIS WITH CONTRAST TECHNIQUE: Multidetector CT imaging of the abdomen and pelvis was performed using the standard protocol following bolus administration of intravenous contrast. CONTRAST:  16mL OMNIPAQUE IOHEXOL 300 MG/ML  SOLN COMPARISON:  CTA November 10, 2017 FINDINGS: Lower chest: Basilar areas of atelectasis and/or scarring. 8 mm ground-glass nodule present in the right lower lobe. Normal heart size. Aortic valve replacement is noted. Coronary artery calcifications are present. No pericardial effusion. Hepatobiliary: No focal liver abnormality is seen. Patient is post cholecystectomy. Slight prominence of the biliary tree likely related to reservoir effect. No calcified intraductal gallstones. Pancreas: Unremarkable. No pancreatic ductal dilatation or surrounding inflammatory changes. Spleen: Normal in size without focal  abnormality. Adrenals/Urinary Tract: Adrenal glands are normal. Mild bilateral symmetric perinephric stranding, a nonspecific finding though may correlate with either age or decreased renal function. Subcentimeter hypoattenuating focus in the upper pole left kidney is too small to fully characterize on CT imaging but statistically likely benign. The kidneys are otherwise unremarkable, without renal calculi, suspicious lesion, or hydronephrosis. No extravasation of contrast is seen on excretory phase delayed imaging. Portion of the right anterolateral bladder protrudes into the right fat containing hernia. No resulting obstruction. Stomach/Bowel: Distal esophagus, stomach and duodenal sweep are unremarkable. No small bowel wall thickening or dilatation.  No evidence of obstruction. A normal appendix is visualized. No colonic dilatation or wall thickening. Scattered colonic diverticula without focal pericolonic inflammation to suggest diverticulitis. Vascular/Lymphatic: There dominantly aorta is tortuous and calcified with bilobed fusiform ectatic segments. The first arising just below the level of the renal arteries measures up to 2.4 cm in diameter with some eccentric mural thrombus. The second meant just proximal to the bifurcation measures up to 2.7 cm. No extension into the iliac arteries. No proximal occlusions. No suspicious or enlarged lymph nodes in the included lymphatic chains. Reproductive: Coarse eccentric calcification of the prostate. No concerning abnormalities of the prostate or seminal vesicles. Other: Enlarging fat containing right inguinal hernia with portion the right anterolateral bladder protruding through the hernia aperture. Smaller fat containing left inguinal hernia and tiny umbilical hernia are present as well. No bowel containing hernias or resulting obstruction. No free air or fluid in the abdomen or pelvis. Musculoskeletal: Multilevel degenerative changes are present in the imaged portions of the spine. No acute osseous abnormality or suspicious osseous lesion. IMPRESSION: 1. No acute intra-abdominal process. Few air-filled loops of bowel without frank distention or obstruction. 2. Slightly enlarging fat containing right inguinal hernia with portion of the right anterolateral bladder protruding through the hernia aperture. No bowel containing hernias. 3. Bilobed fusiform infrarenal abdominal aortic ectasia measuring with lobulations measuring up to 2.4 cm and 2.7 cm in diameter, not significantly changed from prior study. Ectatic abdominal aorta at risk for aneurysm development. Recommend followup by ultrasound in 5 years. This recommendation follows ACR consensus guidelines: White Paper of the ACR Incidental Findings Committee II on  Vascular Findings. J Am Coll Radiol 2013; 10:789-794. Aortic aneurysm NOS (ICD10-I71.9) 4. 8 mm ground-glass nodule in the right lower lobe. Initial follow-up with CT at 6-12 months is recommended to confirm persistence. If persistent, repeat CT is recommended every 2 years until 5 years of stability has been established. This recommendation follows the consensus statement: Guidelines for Management of Incidental Pulmonary Nodules Detected on CT Images: From the Fleischner Society 2017; Radiology 2017; 284:228-243. 5. Aortic Atherosclerosis (ICD10-I70.0). 6. Prior aortic valve replacement. Electronically Signed   By: Lovena Le M.D.   On: 04/26/2019 19:29   Dg Pelvis Portable  Result Date: 04/27/2019 CLINICAL DATA:  73 year old male status post unwitnessed fall. Recently tested positive for COVID-19. EXAM: PORTABLE PELVIS 1-2 VIEWS COMPARISON:  CT Abdomen and Pelvis 04/26/2019. FINDINGS: Portable AP supine view at 2132 hours. Femoral heads remain normally located. Pelvis appears stable and intact. Grossly intact proximal femurs. Bone mineralization is within normal limits. Negative visible lower abdominal and pelvic visceral contours. IMPRESSION: No acute fracture or dislocation identified about the pelvis. Electronically Signed   By: Genevie Ann M.D.   On: 04/27/2019 21:50   Portable Chest 1 View  Result Date: 05/02/2019 CLINICAL DATA:  Fever EXAM: PORTABLE CHEST 1 VIEW COMPARISON:  04/27/2019 FINDINGS: Normal heart  size. Aortic valve replacement with changes of minimally invasive surgery. Mildly low volume chest with mild atelectasis. There is no edema, consolidation, effusion, or pneumothorax. IMPRESSION: Mild atelectasis at the lung bases. Electronically Signed   By: Monte Fantasia M.D.   On: 05/02/2019 08:43   Dg Chest Port 1 View  Result Date: 04/27/2019 CLINICAL DATA:  73 year old male status post unwitnessed fall. Recently tested positive for COVID-19. EXAM: PORTABLE CHEST 1 VIEW COMPARISON:   Portable chest 04/26/2019 and earlier. FINDINGS: Portable AP upright view at 2129 hours. Lung volumes and mediastinal contours are stable and within normal limits. Prosthetic cardiac valve. Previous right anterior rib ORIF again noted. Allowing for portable technique the lungs are clear. Visualized tracheal air column is within normal limits. Stable visualized osseous structures. Stable cholecystectomy clips. Negative visible bowel gas pattern. IMPRESSION: No acute cardiopulmonary abnormality or acute traumatic injury identified. Electronically Signed   By: Genevie Ann M.D.   On: 04/27/2019 21:49   Dg Chest Portable 1 View  Result Date: 04/26/2019 CLINICAL DATA:  Syncope EXAM: PORTABLE CHEST 1 VIEW COMPARISON:  07/21/2018 FINDINGS: The heart size and mediastinal contours are within normal limits. Both lungs are clear. The visualized skeletal structures are unremarkable. Prior ORIF of the right anterior second rib. IMPRESSION: No active disease. Electronically Signed   By: Kathreen Devoid   On: 04/26/2019 13:11      Subjective: Patient denies complaints.  No headache, nausea, dizziness or lightheadedness reported.  No chest pain, dyspnea, cough, fever or chills.  As per RN, no acute issues noted.  Discharge Exam:  Vitals:   05/02/19 2101 05/03/19 0500 05/03/19 0744 05/03/19 0845  BP: 119/72 110/80 127/78 134/82  Pulse: 75 72 65 98  Resp: 20 20  18   Temp: 98.4 F (36.9 C) 98.5 F (36.9 C) 98.6 F (37 C) 98.6 F (37 C)  TempSrc: Oral Oral Oral Oral  SpO2: 95% 95% 96% 95%  Weight:      Height:        General exam: Pleasant elderly male, moderately built and nourished lying comfortably propped up in bed without distress. Respiratory system: Clear to auscultation.  No increased work of breathing. Cardiovascular system: S1 and S2 heard, RRR.  No JVD, murmurs or pedal edema.  Gastrointestinal system: Abdomen is nondistended, soft and nontender. No organomegaly or masses felt. Normal bowel sounds  heard. Central nervous system: Alert and oriented.  No focal neurological deficits. Extremities: Symmetric 5 x 5 power. Skin: No rashes, lesions or ulcers Psychiatry: Judgement and insight appear somewhat impaired. Mood & affect appropriate.     The results of significant diagnostics from this hospitalization (including imaging, microbiology, ancillary and laboratory) are listed below for reference.     Microbiology: No results found for this or any previous visit (from the past 240 hour(s)).   Labs: CBC: Recent Labs  Lab 04/27/19 2114 05/02/19 0757 05/03/19 0558  WBC 5.2 4.3 4.6  NEUTROABS 3.8 3.0 3.4  HGB 13.2 12.5* 12.1*  HCT 39.8 35.5* 34.4*  MCV 101.3* 95.4 97.5  PLT 219 176 [id number]    Basic Metabolic Panel: Recent Labs  Lab 04/28/19 0127 04/28/19 0427 04/29/19 0554 04/30/19 0456 05/02/19 0757 05/03/19 0558  NA  --  136 139 138 135 136  K  --  4.1 4.0 4.0 3.9 4.2  CL  --  103 102 102 101 101  CO2  --  21* 26 24 24 24   GLUCOSE  --  129* 89 88 101* 124*  BUN  --  14 12 12 17 22   CREATININE  --  1.15 1.25* 1.21 1.13 1.28*  CALCIUM  --  8.6* 8.8* 8.8* 8.7* 8.8*  MG 1.9  --   --   --  1.8 2.1  PHOS 3.0  --   --   --  3.2 3.6    Liver Function Tests: Recent Labs  Lab 04/27/19 2114 04/28/19 0427 05/02/19 0757 05/03/19 0558  AST 33 29 29 25   ALT 26 22 22 23   ALKPHOS 53 50 50 48  BILITOT 1.2 1.0 0.8 0.9  PROT 6.9 6.2* 5.8* 5.9*  ALBUMIN 3.9 3.5 3.1* 3.1*     Anemia work up Recent Labs    05/02/19 0757 05/03/19 0558  FERRITIN 562* 469*    Urinalysis    Component Value Date/Time   COLORURINE STRAW (A) 04/28/2019 0618   APPEARANCEUR CLEAR 04/28/2019 0618   APPEARANCEUR Clear 02/24/2018 1140   LABSPEC 1.020 04/28/2019 0618   PHURINE 5.0 04/28/2019 0618   GLUCOSEU NEGATIVE 04/28/2019 0618   HGBUR SMALL (A) 04/28/2019 0618   BILIRUBINUR NEGATIVE 04/28/2019 0618   BILIRUBINUR Negative 02/24/2018 1140   KETONESUR 5 (A) 04/28/2019 0618    PROTEINUR NEGATIVE 04/28/2019 0618   NITRITE NEGATIVE 04/28/2019 0618   LEUKOCYTESUR NEGATIVE 04/28/2019 0618    I discussed in detail with patient's son, updated care and answered questions.  Time coordinating discharge: 45 minutes  SIGNED:  Vernell Leep, MD, [place], [facility]. [facility]  To contact the attending provider between 7A-7P or the covering provider during after hours 7P-7A, please log into the web site [email or web address] and access using universal [place] password for that web site. If you do not have the password, please call the hospital operator.

## 2019-05-03 NOTE — Telephone Encounter (Signed)
Copied from [place] [id number]. Topic: General - Other >> May 03, 2019 11:45 AM Keene Breath wrote: Reason for CRM: Patient would like a call back from the nurse or doctor regarding the patient, who is currently in the hospital.  He has some questions for the doctor.  CB# [phone]

## 2019-05-03 NOTE — TOC Progression Note (Signed)
Transition of Care [facility]) - Progression Note    Patient Details  Name: Eric [name] MRN: [id number] Date of Birth: [birth year]  Transition of Care [facility]) CM/SW [place], [place] Phone Number: 05/03/2019, 10:41 AM  Clinical Narrative:   CSW contacted [facility] to ask about bed availability, and they have no male beds available today. Hopeful for tomorrow. CSW faxed insurance authorization request to [facility] for [place] to plan for admission tomorrow. CSW to follow.    Expected Discharge Plan: [place] Barriers to Discharge: Continued Medical Work up, Ship broker  Expected Discharge Plan and Services Expected Discharge Plan: [place] Choice: [place] arrangements for the past 2 months: Single Family Home                                       Social Determinants of Health (SDOH) Interventions    Readmission Risk Interventions No flowsheet data found.

## 2019-05-03 NOTE — TOC Transition Note (Signed)
Transition of Care [facility]) - CM/SW Discharge Note   Patient Details  Name: Eric [name] MRN: [id number] Date of Birth: [birth year]  Transition of Care [facility]) CM/SW Contact:  Geralynn Ochs, LCSW Phone Number: 05/03/2019, 3:32 PM   Clinical Narrative:   Nurse to call report to [phone], Room 801B.    Final next level of care: Skilled Nursing Facility Barriers to Discharge: Barriers Resolved   Patient Goals and CMS Choice Patient states their goals for this hospitalization and ongoing recovery are:: feel better, get back home soon CMS Medicare.gov Compare Post Acute Care list provided to:: Patient Choice offered to / list presented to : Patient  Discharge Placement              Patient chooses bed at: [facility] Patient to be transferred to facility by: [place] Name of family member notified: Elberta Fortis Patient and family notified of of transfer: 05/03/19  Discharge Plan and Services     Post Acute Care Choice: [place]                               Social Determinants of Health (SDOH) Interventions     Readmission Risk Interventions No flowsheet data found.

## 2019-05-03 NOTE — Discharge Instructions (Signed)

## 2019-05-03 NOTE — Telephone Encounter (Signed)
Pt's son Elberta Fortis calling again.

## 2019-05-03 NOTE — Progress Notes (Signed)
Transport arrived to take patient to [facility]. VSS and report given to transport.

## 2019-05-03 NOTE — Progress Notes (Signed)
Report attempted with no answer. Will try again

## 2019-05-05 ENCOUNTER — Telehealth: Payer: Self-pay | Admitting: Rheumatology

## 2019-05-05 DIAGNOSIS — S065X9A Traumatic subdural hemorrhage with loss of consciousness of unspecified duration, initial encounter: Secondary | ICD-10-CM | POA: Insufficient documentation

## 2019-05-05 DIAGNOSIS — S065XAA Traumatic subdural hemorrhage with loss of consciousness status unknown, initial encounter: Secondary | ICD-10-CM | POA: Insufficient documentation

## 2019-05-05 NOTE — Telephone Encounter (Signed)
Janett Billow from [facility] left a voicemail regarding patient's Methotrexate and when he should restart the medication post COVID.  Please call back at [phone] ext [phone]

## 2019-05-05 NOTE — Telephone Encounter (Signed)
Patient tested positive for COVID on 04/23/2019.  Hospital admission 04/27/2019-05/03/2019, discharged to [facility]. Please advise.

## 2019-05-05 NOTE — Telephone Encounter (Signed)
Spoke with Janett Billow from [facility] and rehab. advised that patient may restart methotrexate 2 weeks after the Covid infection if he is symptom-free and his repeat Covid test is negative. She verbalized understanding.

## 2019-05-05 NOTE — Telephone Encounter (Signed)
No one responded from [facility] and rehab.  Please advise that patient may restart methotrexate 2 weeks after the Covid infection if he is symptom-free and his repeat Covid test is negative.

## 2019-05-10 ENCOUNTER — Encounter: Payer: Self-pay | Admitting: Gastroenterology

## 2019-05-10 ENCOUNTER — Other Ambulatory Visit: Payer: Self-pay | Admitting: Physician Assistant

## 2019-05-10 DIAGNOSIS — S065XAA Traumatic subdural hemorrhage with loss of consciousness status unknown, initial encounter: Secondary | ICD-10-CM

## 2019-05-10 DIAGNOSIS — S065X9A Traumatic subdural hemorrhage with loss of consciousness of unspecified duration, initial encounter: Secondary | ICD-10-CM

## 2019-05-12 ENCOUNTER — Ambulatory Visit: Payer: Medicare Other | Admitting: Family

## 2019-05-18 ENCOUNTER — Telehealth: Payer: Self-pay | Admitting: Internal Medicine

## 2019-05-18 NOTE — Telephone Encounter (Signed)
Spoke w/ Marlaine Hind- verbal orders given.

## 2019-05-18 NOTE — Telephone Encounter (Signed)
[place] with [facility] is calling in for VO for PT   2 week 2 and 1 week 1   CB: [phone]

## 2019-05-20 ENCOUNTER — Other Ambulatory Visit: Payer: Self-pay

## 2019-05-21 ENCOUNTER — Ambulatory Visit (INDEPENDENT_AMBULATORY_CARE_PROVIDER_SITE_OTHER): Payer: Medicare Other | Admitting: Internal Medicine

## 2019-05-21 ENCOUNTER — Telehealth: Payer: Self-pay | Admitting: Internal Medicine

## 2019-05-21 ENCOUNTER — Encounter: Payer: Self-pay | Admitting: Internal Medicine

## 2019-05-21 VITALS — BP 141/76 | HR 63 | Temp 97.1°F | Resp 18 | Ht 69.0 in | Wt 189.4 lb

## 2019-05-21 DIAGNOSIS — I609 Nontraumatic subarachnoid hemorrhage, unspecified: Secondary | ICD-10-CM

## 2019-05-21 DIAGNOSIS — S066X0D Traumatic subarachnoid hemorrhage without loss of consciousness, subsequent encounter: Secondary | ICD-10-CM

## 2019-05-21 DIAGNOSIS — I10 Essential (primary) hypertension: Secondary | ICD-10-CM

## 2019-05-21 LAB — CBC WITH DIFFERENTIAL/PLATELET
Basophils Absolute: 0 10*3/uL (ref 0.0–0.1)
Basophils Relative: 0.6 % (ref 0.0–3.0)
Eosinophils Absolute: 0.1 10*3/uL (ref 0.0–0.7)
Eosinophils Relative: 1 % (ref 0.0–5.0)
HCT: 38.2 % — ABNORMAL LOW (ref 39.0–52.0)
Hemoglobin: 12.7 g/dL — ABNORMAL LOW (ref 13.0–17.0)
Lymphocytes Relative: 14.3 % (ref 12.0–46.0)
Lymphs Abs: 0.7 10*3/uL (ref 0.7–4.0)
MCHC: 33.2 g/dL (ref 30.0–36.0)
MCV: 98.7 fl (ref 78.0–100.0)
Monocytes Absolute: 0.5 10*3/uL (ref 0.1–1.0)
Monocytes Relative: 10.7 % (ref 3.0–12.0)
Neutro Abs: 3.8 10*3/uL (ref 1.4–7.7)
Neutrophils Relative %: 73.4 % (ref 43.0–77.0)
Platelets: 196 10*3/uL (ref 150.0–400.0)
RBC: 3.88 Mil/uL — ABNORMAL LOW (ref 4.22–5.81)
RDW: 13.5 % (ref 11.5–15.5)
WBC: 5.1 10*3/uL (ref 4.0–10.5)

## 2019-05-21 LAB — COMPREHENSIVE METABOLIC PANEL
ALT: 18 U/L (ref 0–53)
AST: 20 U/L (ref 0–37)
Albumin: 4 g/dL (ref 3.5–5.2)
Alkaline Phosphatase: 68 U/L (ref 39–117)
BUN: 9 mg/dL (ref 6–23)
CO2: 31 mEq/L (ref 19–32)
Calcium: 9.3 mg/dL (ref 8.4–10.5)
Chloride: 103 mEq/L (ref 96–112)
Creatinine, Ser: 0.98 mg/dL (ref 0.40–1.50)
GFR: 74.9 mL/min (ref >60.00)
Glucose, Bld: 103 mg/dL — ABNORMAL HIGH (ref 70–99)
Potassium: 4.3 mEq/L (ref 3.5–5.1)
Sodium: 141 mEq/L (ref 135–145)
Total Bilirubin: 0.8 mg/dL (ref 0.2–1.2)
Total Protein: 6.5 g/dL (ref 6.0–8.3)

## 2019-05-21 NOTE — Patient Instructions (Addendum)
Please schedule Medicare Wellness with Glenard Haring.   GO TO THE LAB : Get the blood work     GO TO THE FRONT DESK Schedule your next appointment   for a checkup in 6 weeks  We are referring you to the neurosurgeon.  If you do not hear from them next week let me know   Check the  blood pressure 4-5 times a week.  Daily if you can BP GOAL is between 110/65 and  135/85. If it is consistently higher or lower, let me know

## 2019-05-21 NOTE — Telephone Encounter (Signed)
Sandy, from [facility], called stating the pt is requesting to have his speech therapy evaluation done next week. Please advise.   [phone]

## 2019-05-21 NOTE — Telephone Encounter (Signed)
Spoke w/ Sandy, verbal orders given.  

## 2019-05-21 NOTE — Progress Notes (Signed)
Pre visit review using our clinic review tool, if applicable. No additional management support is needed unless otherwise documented below in the visit note. 

## 2019-05-21 NOTE — Progress Notes (Signed)
Subjective:    Patient ID: Eric [name], male    DOB: 1945/08/17, 73 y.o.   MRN: [id number]  DOS:  05/21/2019 Type of visit - description: Hospital follow-up, here with his wife  Since the last office visit, he was admitted again to the hospital and discharge 05/03/2019  Prior to admission he had a fall, developed a subarachnoid hemorrhage, left orbital frontal contusion, nondisplaced left occipital fracture. CTA was negative for aneurysm. Status post Keppra for seizure prophylaxis. Aspirin held until seen by neurosurgery in the outpatient setting.  Syncope: Prior to admission had a syncope, fall; echo showed a mild apical hypokinesis, to follow-up with primary cardiologist as an outpatient. No driving for 6 months  Acute kidney injury: Related to diuretics, ARB and some dehydration.  All of the above in the context of COVID-19 infection.  He did spike a temperature 05/01/2019, etiology unclear.  The decision was made to treat with prednisone empirically   HTN: BP is soft, amlodipine discontinue not on Aldactone or losartan even before the admission.  On carvedilol only.  Fusiform infrarenal abdominal aortic ectasis: At risk of aneurysm, recheck in 5 years. Abnormal CT: Follow-up chest CT in 6 to 12 months.  Was discharged to a SNF, then home 05-14-2019  Review of Systems Since he arrived home, he is a still fatigued but overall improving slowly. Ambulatory BPs typically in the 130s, one reading was 150.  No fever chills No nausea, vomiting, diarrhea Bowel movements normal No real headache but from time to time he feels his head is full of "cotton balls".  From time to time he feels lightheaded when he stands up. Mental status:   occasionally feels some mental fog.  Past Medical History:  Diagnosis Date  . Abnormal LFTs   . Anxiety   . Aortic stenosis 04/15/2011  . Dizziness 04/21/2014  . Dyspnea   . Heart murmur   . HNP (herniated nucleus pulposus), lumbar    L5-S1  . Hyperglycemia 10/20/2014  . Hyperlipidemia   . Hypertension   . Hypothyroidism   . Left bundle branch block (LBBB) 12/18/2017  . Lumbar stenosis   . Macular edema    Dr  Sherlynn Stalls   . 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 AORTIC VALVE REPLACEMENT N/A 12/17/2017   Procedure: MINIMALLY INVASIVE AORTIC VALVE REPLACEMENT (AVR) [ using Edwards Intuity Valve Size 79mm;  Surgeon: Rexene Alberts, MD;  Location: [facility];  Service: Open Heart Surgery;  Laterality: N/A;  MINI THORACOTOMY  . CARDIAC CATHETERIZATION  11/10/00  . CATARACT EXTRACTION W/ INTRAOCULAR LENS IMPLANT     RIGHT EYE  . CHOLECYSTECTOMY    . EYE SURGERY     Right eye cataract  . INGUINAL HERNIA REPAIR     (B)  . LUMBAR LAMINECTOMY/DECOMPRESSION MICRODISCECTOMY N/A 04/23/2018   Procedure: Microlumbar decompression Lumbar five-Sacral one;  Surgeon: Susa Day, MD;  Location: [facility];  Service: Orthopedics;  Laterality: N/A;  . POLYPECTOMY    . RIGHT/LEFT HEART CATH AND CORONARY ANGIOGRAPHY N/A 10/16/2017   Procedure: RIGHT/LEFT HEART CATH AND CORONARY ANGIOGRAPHY;  Surgeon: [name], MD;  Location: [facility];  Service: Cardiovascular;  Laterality: N/A;  . TEE WITHOUT CARDIOVERSION N/A 12/17/2017   Procedure: TRANSESOPHAGEAL ECHOCARDIOGRAM (TEE);  Surgeon: Rexene Alberts, MD;  Location: [facility]  OR;  Service: Open Heart Surgery;  Laterality: N/A;  . TONSILLECTOMY      Social History   Socioeconomic History  . Marital status: Married    Spouse name: Not on file  . Number of children: 3  . Years of education: Not on file  . Highest education level: Not on file  Occupational History  . Occupation: retired- used to work for [employer]: 05/2008  Tobacco Use  . Smoking status:  Former Smoker    Quit date: 04/15/1990    Years since quitting: 29.1  . Smokeless tobacco: Never Used  . Tobacco comment: used to smoke 2 ppd  Substance and Sexual Activity  . Alcohol use: Not Currently    Comment: no ETOH since the 90s  . Drug use: No  . Sexual activity: Not on file  Other Topics Concern  . Not on file  Social History Narrative   Lives w/ wife   3 children, 8 g-kids             Social Determinants of Health   Financial Resource Strain:   . Difficulty of Paying Living Expenses: Not on file  Food Insecurity:   . Worried About Charity fundraiser in the Last Year: Not on file  . Ran Out of Food in the Last Year: Not on file  Transportation Needs:   . Lack of Transportation (Medical): Not on file  . Lack of Transportation (Non-Medical): Not on file  Physical Activity:   . Days of Exercise per Week: Not on file  . Minutes of Exercise per Session: Not on file  Stress:   . Feeling of Stress : Not on file  Social Connections:   . Frequency of Communication with Friends and Family: Not on file  . Frequency of Social Gatherings with Friends and Family: Not on file  . Attends Religious Services: Not on file  . Active Member of Clubs or Organizations: Not on file  . Attends Archivist Meetings: Not on file  . Marital Status: Not on file  Intimate Partner Violence:   . Fear of Current or Ex-Partner: Not on file  . Emotionally Abused: Not on file  . Physically Abused: Not on file  . Sexually Abused: Not on file      Allergies as of 05/21/2019      Reactions   Atorvastatin Other (See Comments)   REACTION: myalgia   Ezetimibe Other (See Comments)   REACTION: myalgia      Medication List       Accurate as of May 21, 2019 11:59 PM. If you have any questions, ask your nurse or doctor.        STOP taking these medications   ascorbic acid 500 MG tablet Commonly known as: VITAMIN C Stopped by: Kathlene November, MD   levETIRAcetam 500 MG  tablet Commonly known as: KEPPRA Stopped by: Kathlene November, MD   zinc sulfate 220 (50 Zn) MG capsule Stopped by: Kathlene November, MD     TAKE these medications   acetaminophen 325 MG tablet Commonly known as: TYLENOL Take 2 tablets (650 mg total) by mouth every 6 (six) hours as needed for mild pain, moderate pain, fever or headache.   carvedilol 12.5 MG tablet Commonly known as: COREG TAKE 1 TABLET (12.5 MG TOTAL) BY MOUTH 2 (TWO) TIMES DAILY WITH A MEAL.   folic acid 1 MG tablet Commonly known as: FOLVITE Take 1 tablet (1 mg total) by mouth  daily.   levothyroxine 88 MCG tablet Commonly known as: SYNTHROID Take 1 tablet (88 mcg total) by mouth daily before breakfast.   predniSONE 1 MG tablet Commonly known as: DELTASONE Take 4 tablets (4 mg total) by mouth daily with breakfast. This is his prior home dose of maintenance prednisone.  This has to be resumed on 05/07/2019 after completion of rapid prednisone taper. What changed:   how much to take  Another medication with the same name was removed. Continue taking this medication, and follow the directions you see here.   rosuvastatin 5 MG tablet Commonly known as: CRESTOR Take 1 tablet (5 mg total) by mouth daily.   VITAMIN D-3 PO Take 2,000 Units by mouth daily.           Objective:   Physical Exam BP (!) 141/76 (BP Location: Left Arm, Patient Position: Sitting, Cuff Size: Normal)   Pulse 63   Temp (!) 97.1 F (36.2 C) (Temporal)   Resp 18   Ht 5\' 9"  (1.753 m)   Wt 189 lb 6 oz (85.9 kg)   SpO2 97%   BMI 27.97 kg/m  General:   Well developed, NAD, BMI noted.  HEENT:  Normocephalic . Face symmetric, atraumatic Neck: Full range of motion. Lungs:  CTA B Normal respiratory effort, no intercostal retractions, no accessory muscle use. Heart: RRR,  no murmur.  no pretibial edema bilaterally  Abdomen:  Not distended, soft, non-tender. No rebound or rigidity.   Skin: Not pale. Not jaundice Neurologic:  alert &  oriented X3.  Speech normal, gait appropriate for age and unassisted Motor symmetric. Psych--  Cognition and judgment appear intact.  Cooperative with normal attention span and concentration.  Behavior appropriate. No anxious or depressed appearing.     Assessment     Assessment HTN Hyperlipidemia Hypothyroidism R.A. dx 2020 Increase LFTs Ultrasound 2002 done for increased LFTs: Normal liver; Hep C negative 02-16-15 Aortic stenosis , dx  2012 , s/p AoVR 12/2017 Macular degeneration, cataracts  +FH CAD  --Pt's Brother has CAD dx age 53s --Patient himself has a cardiac catheterization with minimal to no disease 10/2017  PLAN Subarachnoid hemorrhage, left orbital frontal contusion, nondisplaced left occipital fracture: Status post admission, currently at home, OT already cleared him, still doing PT.  Speech therapy evaluation at home is pending. He seems to be doing well, mentally he is completely clear today. Plan: Referred to neurosurgery for post hospital follow-up Syncope: No further events, echo mildly abnormal, recommend patient to make an appointment to see cardiology next month HTN: Currently only on carvedilol.  BPs are still okay in the 130s at home.  Checkup CMP and CBC.  Continue monitoring.  As he gets stronger most likely his BP will increase.  They will let me know if that is the case COVID-19: Afebrile, resolved Fusiform infrarenal abdominal aortic ectasis: Recheck a imaging in 5 years Abnormal CT: Multiple findings, see last visit, recheck in few months RTC 6 weeks   This visit occurred during the SARS-CoV-2 public health emergency.  Safety protocols were in place, including screening questions prior to the visit, additional usage of staff PPE, and extensive cleaning of exam room while observing appropriate contact time as indicated for disinfecting solutions.

## 2019-05-23 NOTE — Assessment & Plan Note (Signed)
Subarachnoid hemorrhage, left orbital frontal contusion, nondisplaced left occipital fracture: Status post admission, currently at home, OT already cleared him, still doing PT.  Speech therapy evaluation at home is pending. He seems to be doing well, mentally he is completely clear today. Plan: Referred to neurosurgery for post hospital follow-up Syncope: No further events, echo mildly abnormal, recommend patient to make an appointment to see cardiology next month HTN: Currently only on carvedilol.  BPs are still okay in the 130s at home.  Checkup CMP and CBC.  Continue monitoring.  As he gets stronger most likely his BP will increase.  They will let me know if that is the case COVID-19: Afebrile, resolved Fusiform infrarenal abdominal aortic ectasis: Recheck a imaging in 5 years Abnormal CT: Multiple findings, see last visit, recheck in few months RTC 6 weeks

## 2019-05-24 ENCOUNTER — Encounter: Payer: Self-pay | Admitting: Internal Medicine

## 2019-05-27 DIAGNOSIS — E039 Hypothyroidism, unspecified: Secondary | ICD-10-CM

## 2019-05-27 DIAGNOSIS — M069 Rheumatoid arthritis, unspecified: Secondary | ICD-10-CM

## 2019-05-27 DIAGNOSIS — W19XXXD Unspecified fall, subsequent encounter: Secondary | ICD-10-CM

## 2019-05-27 DIAGNOSIS — Z953 Presence of xenogenic heart valve: Secondary | ICD-10-CM

## 2019-05-27 DIAGNOSIS — Z8619 Personal history of other infectious and parasitic diseases: Secondary | ICD-10-CM

## 2019-05-27 DIAGNOSIS — U071 COVID-19: Secondary | ICD-10-CM

## 2019-05-27 DIAGNOSIS — S02102D Fracture of base of skull, left side, subsequent encounter for fracture with routine healing: Secondary | ICD-10-CM | POA: Diagnosis not present

## 2019-05-27 DIAGNOSIS — R41841 Cognitive communication deficit: Secondary | ICD-10-CM

## 2019-05-27 DIAGNOSIS — S066X0D Traumatic subarachnoid hemorrhage without loss of consciousness, subsequent encounter: Secondary | ICD-10-CM

## 2019-05-27 DIAGNOSIS — I119 Hypertensive heart disease without heart failure: Secondary | ICD-10-CM

## 2019-05-31 ENCOUNTER — Other Ambulatory Visit: Payer: Self-pay | Admitting: Rheumatology

## 2019-05-31 ENCOUNTER — Encounter: Payer: Self-pay | Admitting: Internal Medicine

## 2019-05-31 NOTE — Telephone Encounter (Signed)
Last Visit: 04/06/2019 Next Visit: 07/06/2019  Okay to refill per Dr. Estanislado Pandy.

## 2019-06-08 ENCOUNTER — Other Ambulatory Visit: Payer: Self-pay | Admitting: Internal Medicine

## 2019-06-17 ENCOUNTER — Other Ambulatory Visit: Payer: Self-pay

## 2019-06-17 ENCOUNTER — Ambulatory Visit
Admission: RE | Admit: 2019-06-17 | Discharge: 2019-06-17 | Disposition: A | Discharge status: Home / Self Care | Payer: Medicare Other | Source: Ambulatory Visit | Attending: Physician Assistant | Admitting: Physician Assistant

## 2019-06-17 DIAGNOSIS — S065XAA Traumatic subdural hemorrhage with loss of consciousness status unknown, initial encounter: Secondary | ICD-10-CM

## 2019-06-17 DIAGNOSIS — S065X9A Traumatic subdural hemorrhage with loss of consciousness of unspecified duration, initial encounter: Secondary | ICD-10-CM

## 2019-06-22 ENCOUNTER — Other Ambulatory Visit: Payer: Self-pay | Admitting: Rheumatology

## 2019-06-22 NOTE — Telephone Encounter (Signed)
Last Visit: 04/06/2019 Next Visit: 07/06/2019 Labs: 05/21/19 RBC 3.88 Hgb 12.7, Hct 38.2  Okay to refill per Dr. Estanislado Pandy

## 2019-06-23 DIAGNOSIS — Z6830 Body mass index (BMI) 30.0-30.9, adult: Secondary | ICD-10-CM

## 2019-06-23 HISTORY — DX: Body mass index (BMI) 30.0-30.9, adult: Z68.30

## 2019-06-24 ENCOUNTER — Other Ambulatory Visit: Payer: Self-pay | Admitting: Rheumatology

## 2019-06-24 ENCOUNTER — Other Ambulatory Visit: Payer: Self-pay | Admitting: Cardiology

## 2019-06-24 ENCOUNTER — Telehealth: Payer: Self-pay | Admitting: Rheumatology

## 2019-06-24 NOTE — Telephone Encounter (Signed)
Patient states he is no longer having any symptoms from COVID other than fatigue. Patient would like to know when he may restart his MTX. Please advise.

## 2019-06-24 NOTE — Telephone Encounter (Signed)
Patient is over [place] now, and wants to know if he needs to restart MTX. Please call to advise.

## 2019-06-24 NOTE — Telephone Encounter (Signed)
Last Visit: 04/06/2019 Next Visit: 07/06/2019  Okay to refill per Dr. Estanislado Pandy.

## 2019-06-24 NOTE — Telephone Encounter (Signed)
I spoke with Dr. Estanislado Pandy, and she is ok with the patient resuming MTX if all of his symptoms have resolved.

## 2019-06-24 NOTE — Telephone Encounter (Signed)
Advised patient that Dr. Estanislado Pandy is ok with the patient resuming MTX if all of his symptoms have resolved. patient verbalized understanding and states he plans on resuming MTX on 07/02/2019.

## 2019-06-29 ENCOUNTER — Telehealth (INDEPENDENT_AMBULATORY_CARE_PROVIDER_SITE_OTHER): Payer: Medicare Other | Admitting: Rheumatology

## 2019-06-29 ENCOUNTER — Other Ambulatory Visit: Payer: Self-pay

## 2019-06-29 ENCOUNTER — Encounter: Payer: Self-pay | Admitting: Rheumatology

## 2019-06-29 DIAGNOSIS — M0600 Rheumatoid arthritis without rheumatoid factor, unspecified site: Secondary | ICD-10-CM | POA: Diagnosis not present

## 2019-06-29 DIAGNOSIS — M170 Bilateral primary osteoarthritis of knee: Secondary | ICD-10-CM

## 2019-06-29 DIAGNOSIS — Z953 Presence of xenogenic heart valve: Secondary | ICD-10-CM

## 2019-06-29 DIAGNOSIS — M19042 Primary osteoarthritis, left hand: Secondary | ICD-10-CM

## 2019-06-29 DIAGNOSIS — M19041 Primary osteoarthritis, right hand: Secondary | ICD-10-CM | POA: Diagnosis not present

## 2019-06-29 DIAGNOSIS — M19072 Primary osteoarthritis, left ankle and foot: Secondary | ICD-10-CM

## 2019-06-29 DIAGNOSIS — M48061 Spinal stenosis, lumbar region without neurogenic claudication: Secondary | ICD-10-CM

## 2019-06-29 DIAGNOSIS — I447 Left bundle-branch block, unspecified: Secondary | ICD-10-CM

## 2019-06-29 DIAGNOSIS — Z8659 Personal history of other mental and behavioral disorders: Secondary | ICD-10-CM

## 2019-06-29 DIAGNOSIS — Z79899 Other long term (current) drug therapy: Secondary | ICD-10-CM

## 2019-06-29 DIAGNOSIS — M8589 Other specified disorders of bone density and structure, multiple sites: Secondary | ICD-10-CM

## 2019-06-29 DIAGNOSIS — M19071 Primary osteoarthritis, right ankle and foot: Secondary | ICD-10-CM

## 2019-06-29 DIAGNOSIS — Z8639 Personal history of other endocrine, nutritional and metabolic disease: Secondary | ICD-10-CM

## 2019-06-29 DIAGNOSIS — Z840 Family history of diseases of the skin and subcutaneous tissue: Secondary | ICD-10-CM

## 2019-06-29 DIAGNOSIS — I10 Essential (primary) hypertension: Secondary | ICD-10-CM

## 2019-06-29 NOTE — Progress Notes (Signed)
Virtual Visit via Telephone Note  I connected with Eric [name] on 06/29/19 at  9:45 AM EST by telephone and verified that I am speaking with the correct person using two identifiers.  Location: Patient: Home  Provider: Clinic  This service was conducted via virtual visit. The patient was located at home. I was located in my office.  Consent was obtained prior to the virtual visit and is aware of possible charges through their insurance for this visit.  The patient is an established patient.  Dr. Estanislado Pandy, MD conducted the virtual visit and Hazel Sams, PA-C acted as scribe during the service.  Office staff helped with scheduling follow up visits after the service was conducted.     I discussed the limitations, risks, security and privacy concerns of performing an evaluation and management service by telephone and the availability of in person appointments. I also discussed with the patient that there may be a patient responsible charge related to this service. The patient expressed understanding and agreed to proceed.  CC:  Medication monitoring  History of Present Illness: Patient is a 74 year old male with a past medical history of seronegative rheumatoid arthritis and osteoarthritis. He is currently taking prednisone 2 mg po daily and will be tapering to 1 mg daily soon.  He has been holding MTX since 04/30/19 after being diagnosed with  COVID-19 in mid November 2020.  He had a syncopal episode on 04/27/19 and was admitted to the hospital. He developed a subarachnoid hemorrhage and nondisplaced left occipital fracture.  He was cleared by the neurosurgeon recently.  He states all of his symptoms have resolved.  He continues to have residual fatigue but is improving day by day.  He plans on restarting MTX on Friday.  He denies any recent rheumatoid arthritis flares.   Review of Systems  Constitutional: Positive for malaise/fatigue. Negative for fever.  HENT: Negative for congestion.   Eyes:  Negative for photophobia, pain, discharge and redness.  Respiratory: Negative for cough, shortness of breath and wheezing.   Cardiovascular: Negative for chest pain and palpitations.  Gastrointestinal: Negative for blood in stool, constipation and diarrhea.  Genitourinary: Negative for dysuria and frequency.  Musculoskeletal: Positive for joint pain. Negative for back pain, myalgias and neck pain.  Skin: Negative for rash.  Neurological: Negative for dizziness, weakness and headaches.  Psychiatric/Behavioral: Negative for depression and memory loss. The patient is not nervous/anxious and does not have insomnia.       Observations/Objective: Physical Exam  Constitutional: He is oriented to person, place, and time.  Neurological: He is alert and oriented to person, place, and time.  Psychiatric: Mood, memory, affect and judgment normal.   Patient reports morning stiffness for 5 hours.   Patient denies nocturnal pain.  Difficulty dressing/grooming: Denies Difficulty climbing stairs: Denies Difficulty getting out of chair: Denies Difficulty using hands for taps, buttons, cutlery, and/or writing: Reports   Assessment and Plan: Visit Diagnoses: Seronegative rheumatoid arthritis -He has not had any recent rheumatoid arthritis flares.  He has no joint pain or inflammation at this time. He has been holding MTX since 04/30/19 due to being diagnosed with COVID-19.  He had a syncopal episode on 04/27/19 and was found to have a subarachnoid hemorrhage and a nondisplaced left occipital fracture.  He was cleared by neurosurgery after a repeat head CT on 06/17/19.  His symptoms have resolved.  He is planning on resuming methotrexate 4 tablets by mouth once weekly on Friday.  He continues to take  prednisone 2 mg po daily and is planning on tapering to 1 mg in February 2021. He was advised to notify us if he develops increased joint pain or joint swelling. He will follow   High risk medication use - MTX 4  tablets po once weekly and folic acid 2 mg po daily, prednisone 4 mg p.o. daily. CBC and CMP were drawn on 05/21/19.  He will be due to update lab work in March and every 3 months.   Primary osteoarthritis of both hands-He is not experiencing any discomfort at this time.  No joint inflammation.  Joint protection and muscle strengthening were discussed.   Primary osteoarthritis of both knees-He has no knee joint pain or inflammation.  Primary osteoarthritis of both feet-He has no feet pain or joint swelling.   Spinal stenosis of lumbar region, unspecified whether neurogenic claudication present-He has intermittent lower back pain.    Osteopenia-DEXA scan obtained April 23, 2019 showed a T score of -1.1.  Patient is tapering off prednisone.  We will repeat bone density in 2 years.  Other medical conditions are listed as follows:   History of anxiety  Left bundle branch block (LBBB)  Essential hypertension  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  History of hypothyroidism  Family history of psoriasis  On prednisone therapy  Follow Up Instructions: He will follow up in 3-4 months    I discussed the assessment and treatment plan with the patient. The patient was provided an opportunity to ask questions and all were answered. The patient agreed with the plan and demonstrated an understanding of the instructions.   The patient was advised to call back or seek an in-person evaluation if the symptoms worsen or if the condition fails to improve as anticipated.  I provided 25 minutes of non-face-to-face time during this encounter.   [name], MD   Scribed by-  Hazel Sams, PA-C

## 2019-07-06 ENCOUNTER — Ambulatory Visit: Payer: Medicare Other | Admitting: Rheumatology

## 2019-07-09 ENCOUNTER — Other Ambulatory Visit: Payer: Self-pay

## 2019-07-12 ENCOUNTER — Ambulatory Visit: Payer: Medicare Other | Admitting: Internal Medicine

## 2019-07-12 ENCOUNTER — Other Ambulatory Visit: Payer: Self-pay

## 2019-07-12 ENCOUNTER — Encounter: Payer: Self-pay | Admitting: Internal Medicine

## 2019-07-12 VITALS — BP 133/72 | HR 67 | Temp 97.3°F | Resp 16 | Ht 69.0 in | Wt 192.1 lb

## 2019-07-12 DIAGNOSIS — E038 Other specified hypothyroidism: Secondary | ICD-10-CM | POA: Diagnosis not present

## 2019-07-12 DIAGNOSIS — I10 Essential (primary) hypertension: Secondary | ICD-10-CM | POA: Diagnosis not present

## 2019-07-12 DIAGNOSIS — I609 Nontraumatic subarachnoid hemorrhage, unspecified: Secondary | ICD-10-CM

## 2019-07-12 DIAGNOSIS — R42 Dizziness and giddiness: Secondary | ICD-10-CM | POA: Diagnosis not present

## 2019-07-12 LAB — CBC WITH DIFFERENTIAL/PLATELET
Basophils Absolute: 0 10*3/uL (ref 0.0–0.1)
Basophils Relative: 0.1 % (ref 0.0–3.0)
Eosinophils Absolute: 0.1 10*3/uL (ref 0.0–0.7)
Eosinophils Relative: 1 % (ref 0.0–5.0)
HCT: 43.1 % (ref 39.0–52.0)
Hemoglobin: 14.5 g/dL (ref 13.0–17.0)
Lymphocytes Relative: 20.6 % (ref 12.0–46.0)
Lymphs Abs: 1.3 10*3/uL (ref 0.7–4.0)
MCHC: 33.6 g/dL (ref 30.0–36.0)
MCV: 93.1 fl (ref 78.0–100.0)
Monocytes Absolute: 0.6 10*3/uL (ref 0.1–1.0)
Monocytes Relative: 8.9 % (ref 3.0–12.0)
Neutro Abs: 4.4 10*3/uL (ref 1.4–7.7)
Neutrophils Relative %: 69.4 % (ref 43.0–77.0)
Platelets: 238 10*3/uL (ref 150.0–400.0)
RBC: 4.63 Mil/uL (ref 4.22–5.81)
RDW: 13.3 % (ref 11.5–15.5)
WBC: 6.3 10*3/uL (ref 4.0–10.5)

## 2019-07-12 LAB — BASIC METABOLIC PANEL
BUN: 17 mg/dL (ref 6–23)
CO2: 28 mEq/L (ref 19–32)
Calcium: 9.9 mg/dL (ref 8.4–10.5)
Chloride: 102 mEq/L (ref 96–112)
Creatinine, Ser: 1.16 mg/dL (ref 0.40–1.50)
GFR: 61.63 mL/min (ref >60.00)
Glucose, Bld: 109 mg/dL — ABNORMAL HIGH (ref 70–99)
Potassium: 4.4 mEq/L (ref 3.5–5.1)
Sodium: 139 mEq/L (ref 135–145)

## 2019-07-12 LAB — TSH: TSH: 2.66 u[IU]/mL (ref 0.35–4.50)

## 2019-07-12 NOTE — Progress Notes (Signed)
Pre visit review using our clinic review tool, if applicable. No additional management support is needed unless otherwise documented below in the visit note. 

## 2019-07-12 NOTE — Progress Notes (Signed)
Subjective:    Patient ID: Eric [name], male    DOB: 13-Feb-1946, 74 y.o.   MRN: [id number]  DOS:  07/12/2019 Type of visit - description: Follow-up  In general feeling well. He finished rehab for [facility], saw neurosurgery, restarted aspirin. HTN: Was high in mid December subsequently we added amlodipine and then self- added spironolactone due to edema. Edema resolved.  BPs are better  Review of Systems Continue with "lightheadedness" not really dizziness. Sometimes after he takes all his medications in the morning he feels fatigued, has some nausea and a "brain fog".   No headache per se No neck pain No chest pain or difficulty breathing but occasionally gets DOE.  Not consistently so. No palpitations   Past Medical History:  Diagnosis Date  . Abnormal LFTs   . Anxiety   . Aortic stenosis 04/15/2011  . Dizziness 04/21/2014  . Dyspnea   . Heart murmur   . HNP (herniated nucleus pulposus), lumbar    L5-S1  . Hyperglycemia 10/20/2014  . Hyperlipidemia   . Hypertension   . Hypothyroidism   . Left bundle branch block (LBBB) 12/18/2017  . Lumbar stenosis   . Macular edema    Dr  Sherlynn Stalls   . 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 AORTIC VALVE REPLACEMENT N/A 12/17/2017   Procedure: MINIMALLY INVASIVE AORTIC VALVE REPLACEMENT (AVR) [ using Edwards Intuity Valve Size 58mm;  Surgeon: Rexene Alberts, MD;  Location: [place];  Service: Open Heart Surgery;  Laterality: N/A;  MINI THORACOTOMY  . CARDIAC CATHETERIZATION  11/10/00  . CATARACT EXTRACTION W/ INTRAOCULAR LENS IMPLANT     RIGHT EYE  . CHOLECYSTECTOMY    . EYE SURGERY     Right eye cataract  . INGUINAL HERNIA REPAIR     (B)  . LUMBAR LAMINECTOMY/DECOMPRESSION MICRODISCECTOMY N/A 04/23/2018   Procedure:  Microlumbar decompression Lumbar five-Sacral one;  Surgeon: Susa Day, MD;  Location: [place];  Service: Orthopedics;  Laterality: N/A;  . POLYPECTOMY    . RIGHT/LEFT HEART CATH AND CORONARY ANGIOGRAPHY N/A 10/16/2017   Procedure: RIGHT/LEFT HEART CATH AND CORONARY ANGIOGRAPHY;  Surgeon: [name], MD;  Location: [place] [facility];  Service: Cardiovascular;  Laterality: N/A;  . TEE WITHOUT CARDIOVERSION N/A 12/17/2017   Procedure: TRANSESOPHAGEAL ECHOCARDIOGRAM (TEE);  Surgeon: Rexene Alberts, MD;  Location: [place];  Service: Open Heart Surgery;  Laterality: N/A;  . TONSILLECTOMY          Objective:   Physical Exam BP 133/72 (BP Location: Left Arm, Patient Position: Sitting, Cuff Size: Small)   Pulse 67   Temp (!) 97.3 F (36.3 C) (Temporal)   Resp 16   Ht 5\' 9"  (1.753 m)   Wt 192 lb 2 oz (87.1 kg)   SpO2 97%   BMI 28.37 kg/m  General:   Well developed, NAD, BMI noted. HEENT:  Normocephalic . Face symmetric, atraumatic Lungs:  CTA B Normal respiratory effort, no intercostal retractions, no accessory muscle use. Heart: RRR,  no murmur.  No pretibial edema bilaterally  Skin: Not pale. Not jaundice Neurologic:  alert & oriented X3.  Speech normal, gait appropriate for age and unassisted Psych--  Cognition and judgment appear intact.  Cooperative with normal attention span and concentration.  Behavior appropriate. No anxious or depressed appearing.  Assessment     Assessment HTN Hyperlipidemia Hypothyroidism R.A. dx 2020 Increase LFTs Ultrasound 2002 done for increased LFTs: Normal liver; Hep C negative 02-16-15 Aortic stenosis , dx  2012 , s/p AoVR 12/2017 Macular degeneration, cataracts  +FH CAD  --Pt's Brother has CAD dx age 5s --Patient himself has a cardiac catheterization with minimal to no disease 10/2017  PLAN HTN: Since the last visit he continue with carvedilol. Amlodipine was added back by mid December as his BPs were elevated occasionally  in the 180s.  Then increase to 5 mg. The patient self restarted the spironolactone 3 weeks ago due to lower extremity edema.  Edema better. Currently, BPs are better in the 130s, 140s.  We will check a BMP and CBC. SAH, left orbital contusion, nondisplaced placed left occipital fracture: Finished rehab, doing well, saw neurosurgery, restarted aspirin, return to neurosurgery as needed Hypothyroidism: Check TSH Lightheadedness, brain fog: The patient is feeling that way for several weeks, he thinks it could be his medication, however could be also related to recent Covid or brain injury.  We agreed to spread his BP meds throughout the day and observation. RTC 3 months   This visit occurred during the SARS-CoV-2 public health emergency.  Safety protocols were in place, including screening questions prior to the visit, additional usage of staff PPE, and extensive cleaning of exam room while observing appropriate contact time as indicated for disinfecting solutions.

## 2019-07-12 NOTE — Patient Instructions (Addendum)
Please schedule Medicare Wellness with Glenard Haring.   GO TO THE LAB : Get the blood work     [place]  Please come back for a checkup in 3 months, make an appointment    Check the  blood pressure 2 or 3 times a week BP GOAL is between 110/65 and  135/85. If it is consistently higher or lower, let me know

## 2019-07-13 NOTE — Assessment & Plan Note (Signed)
HTN: Since the last visit he continue with carvedilol. Amlodipine was added back by mid December as his BPs were elevated occasionally in the 180s.  Then increase to 5 mg. The patient self restarted the spironolactone 3 weeks ago due to lower extremity edema.  Edema better. Currently, BPs are better in the 130s, 140s.  We will check a BMP and CBC. SAH, left orbital contusion, nondisplaced placed left occipital fracture: Finished rehab, doing well, saw neurosurgery, restarted aspirin, return to neurosurgery as needed Hypothyroidism: Check TSH Lightheadedness, brain fog: The patient is feeling that way for several weeks, he thinks it could be his medication, however could be also related to recent Covid or brain injury.  We agreed to spread his BP meds throughout the day and observation. RTC 3 months

## 2019-08-08 ENCOUNTER — Ambulatory Visit: Payer: Medicare Other | Attending: Internal Medicine

## 2019-08-08 DIAGNOSIS — Z23 Encounter for immunization: Secondary | ICD-10-CM | POA: Insufficient documentation

## 2019-08-08 NOTE — Progress Notes (Signed)
   Covid-19 Vaccination Clinic  Name:  Eric [name]    MRN: [id number] DOB: 1946/01/28  08/08/2019  Eric [name] was observed post Covid-19 immunization for 15 minutes without incidence. He was provided with Vaccine Information Sheet and instruction to access the V-Safe system.   Eric [name] was instructed to call 911 with any severe reactions post vaccine: Marland Kitchen Difficulty breathing  . Swelling of your face and throat  . A fast heartbeat  . A bad rash all over your body  . Dizziness and weakness    Immunizations Administered    Name Date Dose VIS Date Route   Pfizer COVID-19 Vaccine 08/08/2019  1:45 PM 0.3 mL 05/21/2019 Intramuscular   Manufacturer: [place]   Lot: [id number]   [place]: [id number]

## 2019-08-22 IMAGING — CR DG CHEST 2V
2 series · 2 of 2 positions shown · non-contrast
Comparison: 09/30/2017

CLINICAL DATA: Preoperative evaluation for AVR, history
hypertension, aortic stenosis, former smoker

EXAM:
CHEST - 2 VIEW

[w chest pa]
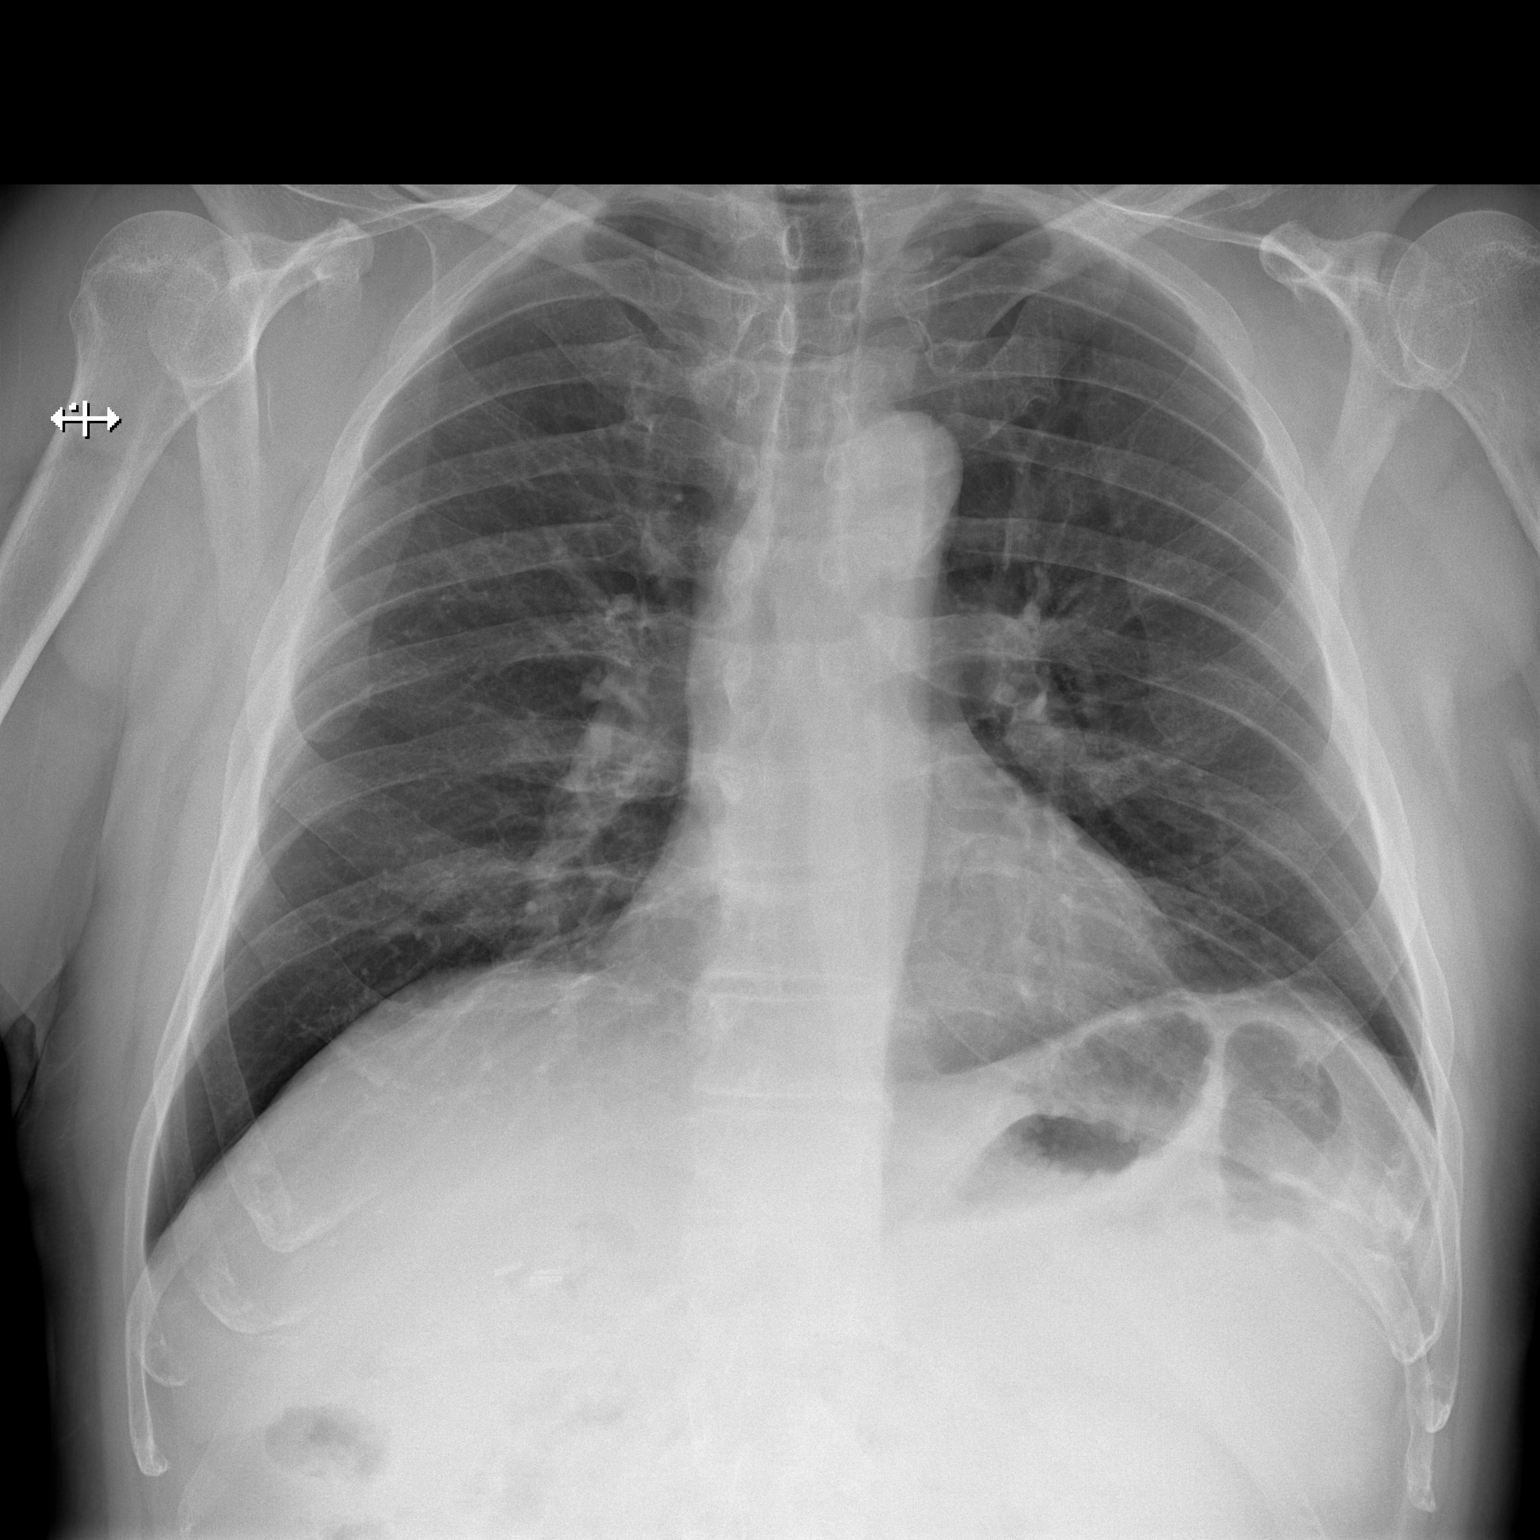

[w chest lat]
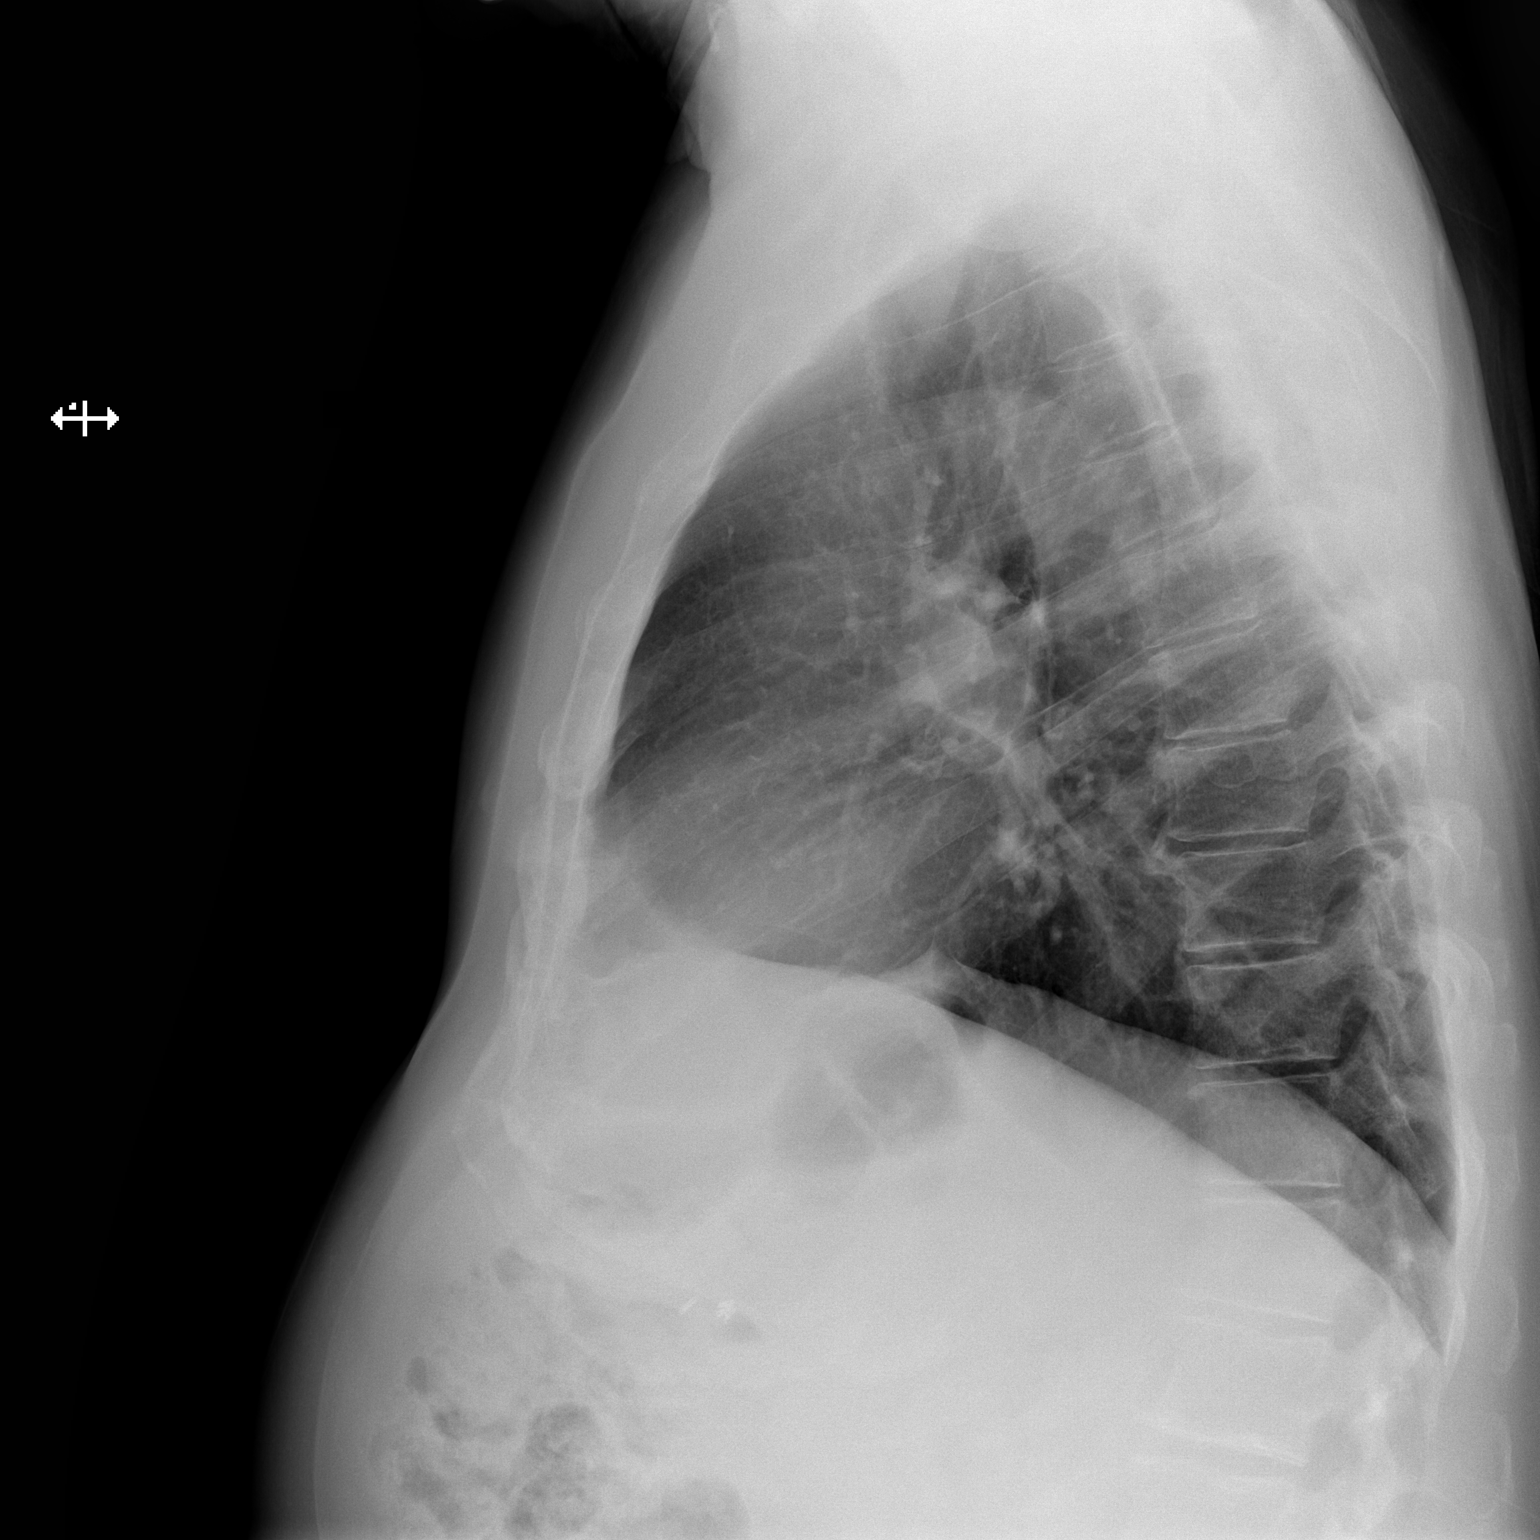

[2 of 2 positions shown; findings below may reference images not displayed]

FINDINGS: Normal heart size, mediastinal contours, and pulmonary vascularity.

Lungs clear.

No pleural effusion or pneumothorax.

Bones unremarkable.
IMPRESSION: Normal exam.

## 2019-08-24 ENCOUNTER — Other Ambulatory Visit: Payer: Self-pay

## 2019-08-24 DIAGNOSIS — Z79899 Other long term (current) drug therapy: Secondary | ICD-10-CM

## 2019-08-25 LAB — COMPLETE METABOLIC PANEL WITH GFR
AG Ratio: 1.7 (calc) (ref 1.0–2.5)
ALT: 14 U/L (ref 9–46)
AST: 22 U/L (ref 10–35)
Albumin: 4.3 g/dL (ref 3.6–5.1)
Alkaline phosphatase (APISO): 62 U/L (ref 35–144)
BUN: 13 mg/dL (ref 7–25)
CO2: 24 mmol/L (ref 20–32)
Calcium: 9.8 mg/dL (ref 8.6–10.3)
Chloride: 105 mmol/L (ref 98–110)
Creat: 1.18 mg/dL (ref 0.70–1.18)
GFR, Est African American: 71 mL/min/{1.73_m2} (ref >=60)
GFR, Est Non African American: 61 mL/min/{1.73_m2} (ref >=60)
Globulin: 2.6 g/dL (calc) (ref 1.9–3.7)
Glucose, Bld: 91 mg/dL (ref 65–99)
Potassium: 4.3 mmol/L (ref 3.5–5.3)
Sodium: 140 mmol/L (ref 135–146)
Total Bilirubin: 1 mg/dL (ref 0.2–1.2)
Total Protein: 6.9 g/dL (ref 6.1–8.1)

## 2019-08-25 LAB — CBC WITH DIFFERENTIAL/PLATELET
Absolute Monocytes: 586 cells/uL (ref 200–950)
Basophils Absolute: 12 cells/uL (ref 0–200)
Basophils Relative: 0.2 %
Eosinophils Absolute: 52 cells/uL (ref 15–500)
Eosinophils Relative: 0.9 %
HCT: 41.1 % (ref 38.5–50.0)
Hemoglobin: 13.6 g/dL (ref 13.2–17.1)
Lymphs Abs: 1392 cells/uL (ref 850–3900)
MCH: 30.8 pg (ref 27.0–33.0)
MCHC: 33.1 g/dL (ref 32.0–36.0)
MCV: 93 fL (ref 80.0–100.0)
MPV: 10.1 fL (ref 7.5–12.5)
Monocytes Relative: 10.1 %
Neutro Abs: 3758 cells/uL (ref 1500–7800)
Neutrophils Relative %: 64.8 %
Platelets: 257 10*3/uL (ref 140–400)
RBC: 4.42 10*6/uL (ref 4.20–5.80)
RDW: 14.1 % (ref 11.0–15.0)
Total Lymphocyte: 24 %
WBC: 5.8 10*3/uL (ref 3.8–10.8)

## 2019-08-25 NOTE — Progress Notes (Signed)
CBC and CMP are normal.

## 2019-08-28 IMAGING — DX DG CHEST 2V
2 series · 2 of 2 positions shown · non-contrast
Comparison: 12/20/2017

CLINICAL DATA: Chest tube placement

EXAM:
CHEST - 2 VIEW

[chest pa]
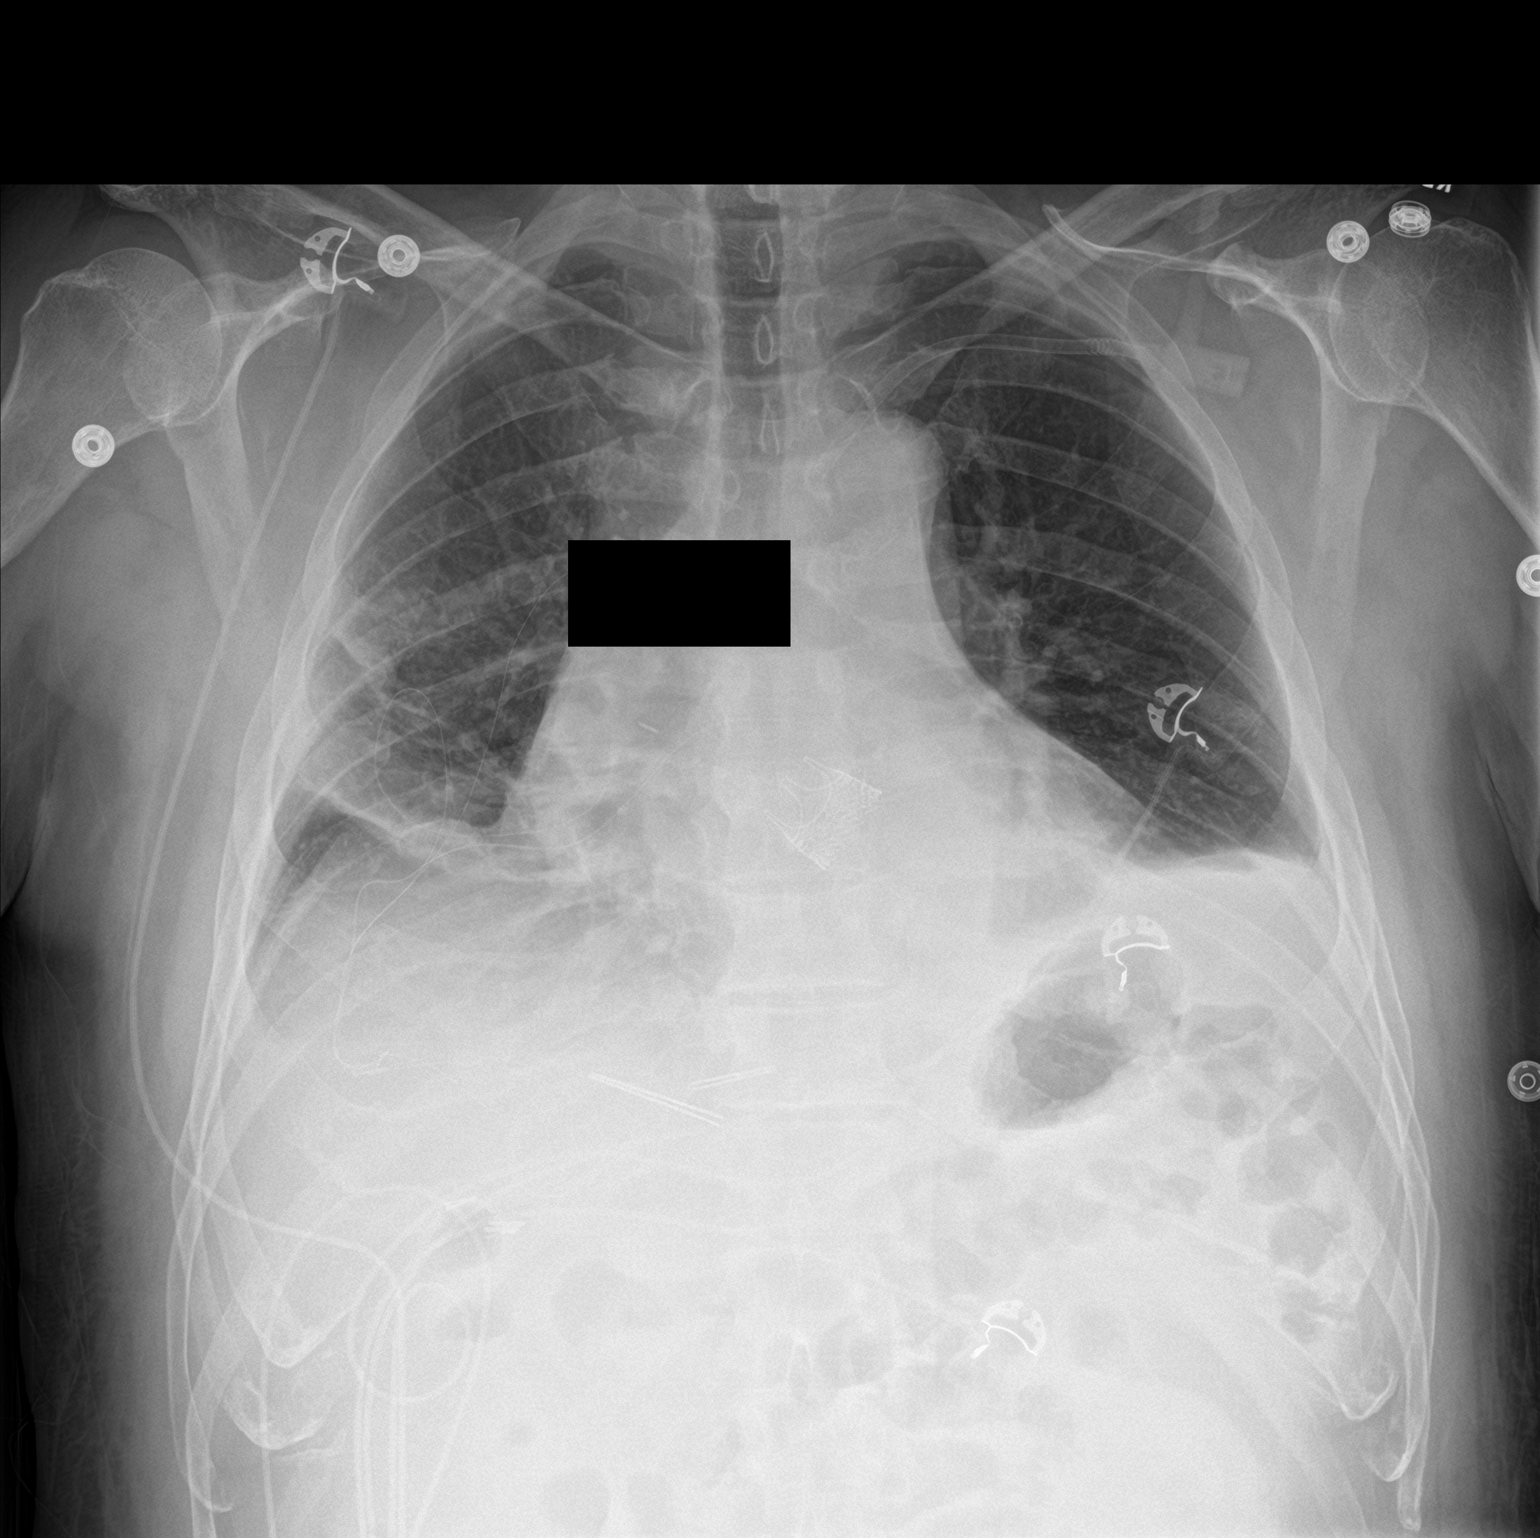

[chest lat]
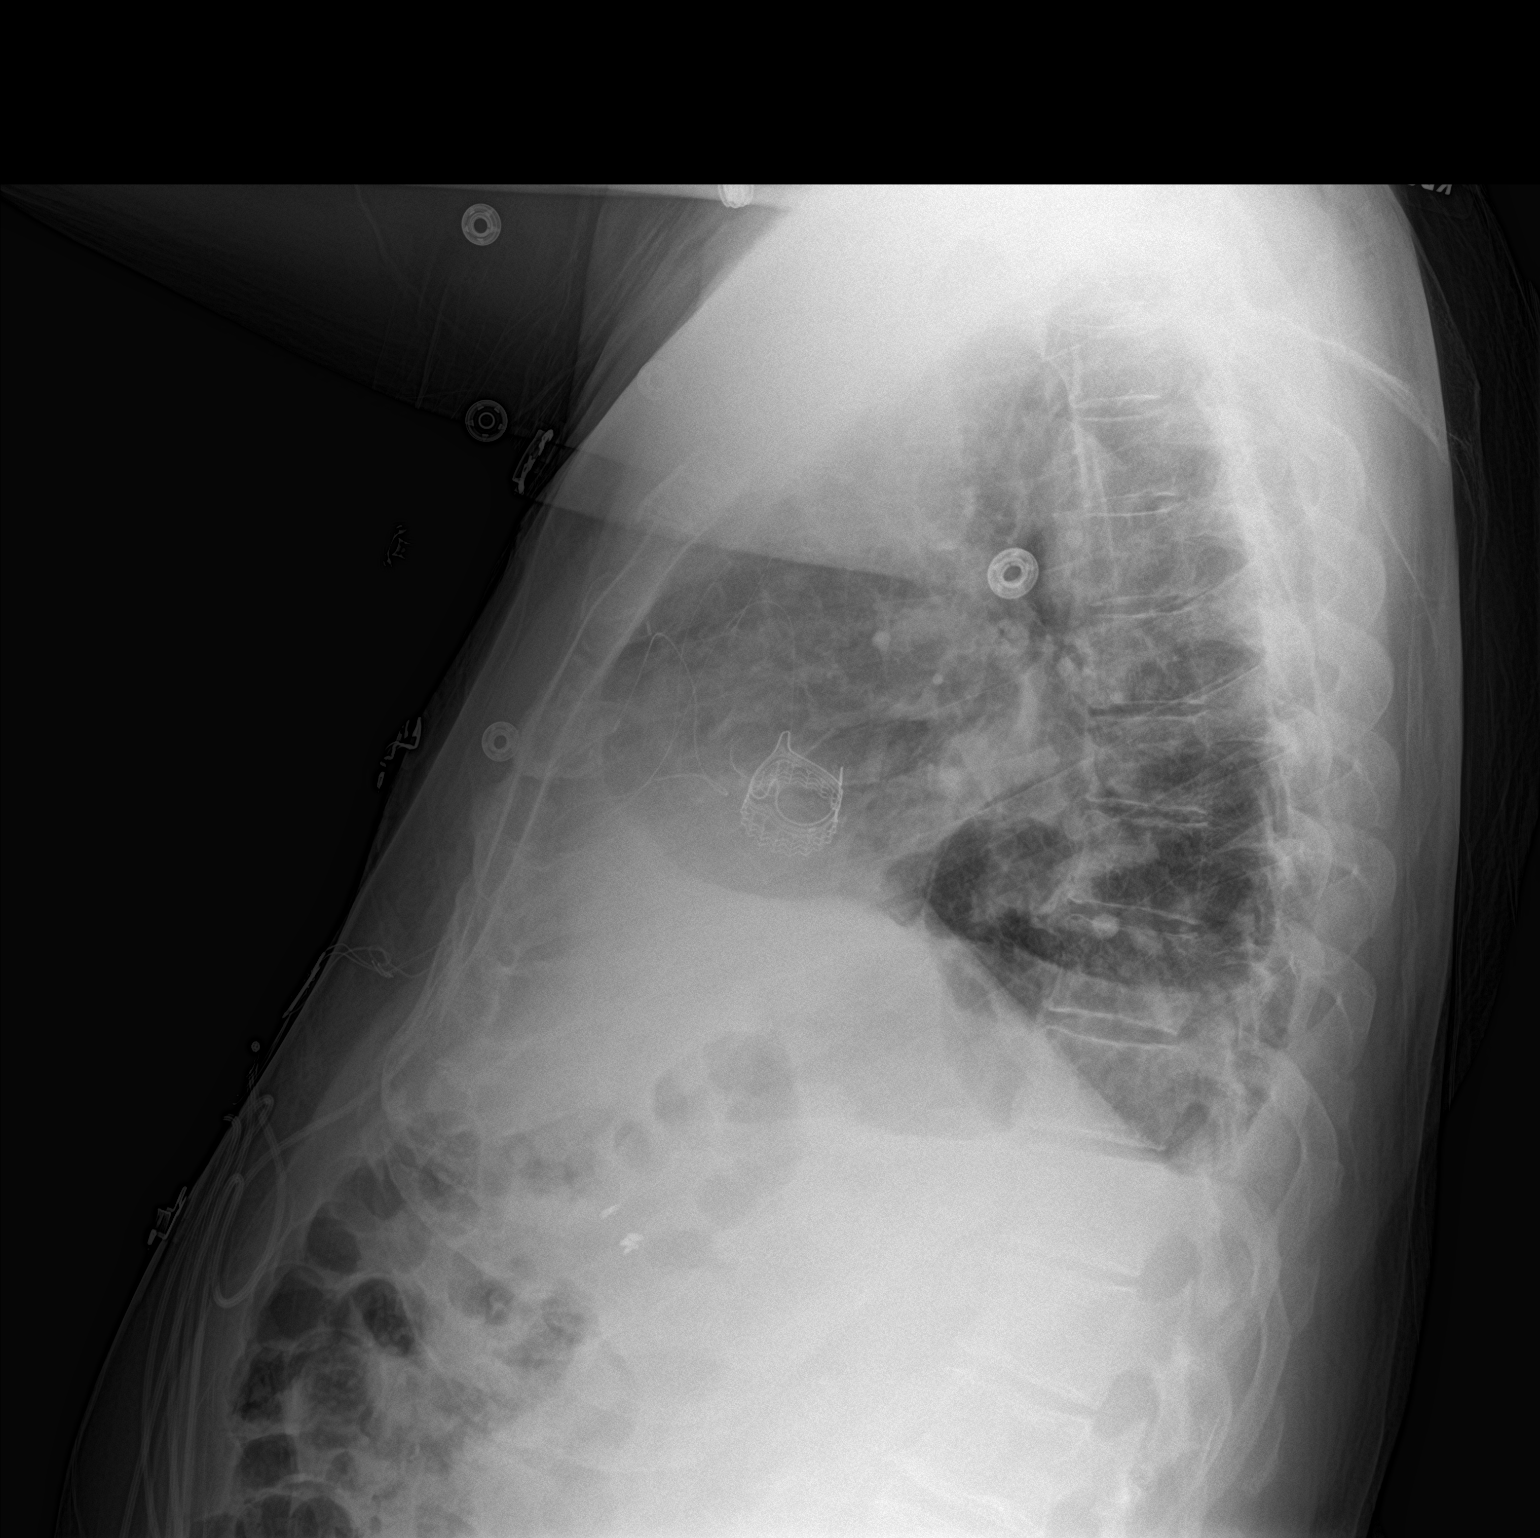

[2 of 2 positions shown; findings below may reference images not displayed]

FINDINGS: Normal heart size. Right chest tube has been removed. No
pneumothorax visualized. Areas of platelike atelectasis noted within
the right midlung and right base. There is a left pleural effusion
which appears unchanged from previous exam.
IMPRESSION: 1. No pneumothorax after chest tube removal.
2. Left lung platelike atelectasis.

## 2019-08-31 ENCOUNTER — Ambulatory Visit: Payer: Medicare Other | Attending: Internal Medicine

## 2019-08-31 DIAGNOSIS — Z23 Encounter for immunization: Secondary | ICD-10-CM

## 2019-08-31 NOTE — Progress Notes (Signed)
   Covid-19 Vaccination Clinic  Name:  Eric [name]    MRN: [id number] DOB: February 17, 1946  08/31/2019  [name] was observed post Covid-19 immunization for 15 minutes without incident. He was provided with Vaccine Information Sheet and instruction to access the V-Safe system.   [name] was instructed to call 911 with any severe reactions post vaccine: Marland Kitchen Difficulty breathing  . Swelling of face and throat  . A fast heartbeat  . A bad rash all over body  . Dizziness and weakness   Immunizations Administered    Name Date Dose VIS Date Route   Pfizer COVID-19 Vaccine 08/31/2019  8:55 AM 0.3 mL 05/21/2019 Intramuscular   Manufacturer: [place]   Lot: [id number]   [place]: [id number]

## 2019-09-06 ENCOUNTER — Other Ambulatory Visit: Payer: Self-pay | Admitting: Cardiology

## 2019-09-07 ENCOUNTER — Ambulatory Visit: Payer: Medicare Other

## 2019-09-21 NOTE — Progress Notes (Signed)
Cardiology Office Note:    Date:  09/22/2019   ID:  Eric [name], DOB October 03, 1945, MRN [id number]  PCP:  Colon Branch, MD  Cardiologist:  Shirlee More, MD    Referring MD: Colon Branch, MD    ASSESSMENT:    1. 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 Left bundle branch block (LBBB)   3. Essential hypertension   4. Hyperlipidemia, unspecified hyperlipidemia type    PLAN:    In order of problems listed above:  1. Stable normal valve function no evidence of dysfunction I reviewed his recent echocardiogram with him 2. Stable he has had no documented bradycardia 3. BP at target I am concerned with his orthostatic drop and I told him to hold his calcium channel blocker in a day his systolic is 123456 or less and continue spironolactone with normal renal function potassium 4. Stable continue with statin   Next appointment: 6 months   Medication Adjustments/Labs and Tests Ordered: Current medicines are reviewed at length with the patient today.  Concerns regarding medicines are outlined above.  No orders of the defined types were placed in this encounter.  No orders of the defined types were placed in this encounter.   Chief Complaint  Patient presents with  . Follow-up    History of Present Illness:    Eric [name] is a 74 y.o. male with a hx of aortic stenosis status post surgical bioprosthetic AVR hypertension hyperlipidemia and hypothyroidism.  He was last seen 03/29/2019.  He was having episodes of orthostatic lightheadedness related to his antihypertensive agents.  He was admitted to [facility] in November with a fall and subarachnoid hemorrhage and had COVID-19 infection.  Evaluation included echocardiogram showing normal valve function ejection fraction 60 to 65% and he had mild carotid atherosclerosis but no stenotic lesion. Compliance with diet, lifestyle and medications: Yes  Is slowly and steadily improving but still has  fatigue and shortness of breath particularly bending over.  He has had no syncope his home blood pressures typically around 1 30-1 40 range.  In my office today he has a 10 mm drop when he stands.  I am going to have him hold his amlodipine any days systolics less than 123456.  He has had no chest pain palpitation or recurrent syncope.  In retrospect his syncope was due to COVID-19. Past Medical History:  Diagnosis Date  . Abnormal LFTs   . Anxiety   . Aortic stenosis 04/15/2011  . Dizziness 04/21/2014  . Dyspnea   . Heart murmur   . HNP (herniated nucleus pulposus), lumbar    L5-S1  . Hyperglycemia 10/20/2014  . Hyperlipidemia   . Hypertension   . Hypothyroidism   . Left bundle branch block (LBBB) 12/18/2017  . Lumbar stenosis   . Macular edema    Dr  Sherlynn Stalls   . 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 AORTIC VALVE REPLACEMENT N/A 12/17/2017   Procedure: MINIMALLY INVASIVE AORTIC VALVE REPLACEMENT (AVR) [ using Edwards Intuity Valve Size 15mm;  Surgeon: Rexene Alberts, MD;  Location: [place];  Service: Open Heart Surgery;  Laterality: N/A;  MINI THORACOTOMY  . CARDIAC CATHETERIZATION  11/10/00  . CATARACT EXTRACTION W/ INTRAOCULAR LENS IMPLANT     RIGHT EYE  . CHOLECYSTECTOMY    .  EYE SURGERY     Right eye cataract  . INGUINAL HERNIA REPAIR     (B)  . LUMBAR LAMINECTOMY/DECOMPRESSION MICRODISCECTOMY N/A 04/23/2018   Procedure: Microlumbar decompression Lumbar five-Sacral one;  Surgeon: Susa Day, MD;  Location: [place];  Service: Orthopedics;  Laterality: N/A;  . POLYPECTOMY    . RIGHT/LEFT HEART CATH AND CORONARY ANGIOGRAPHY N/A 10/16/2017   Procedure: RIGHT/LEFT HEART CATH AND CORONARY ANGIOGRAPHY;  Surgeon: [name], MD;  Location: [place] [facility];   Service: Cardiovascular;  Laterality: N/A;  . TEE WITHOUT CARDIOVERSION N/A 12/17/2017   Procedure: TRANSESOPHAGEAL ECHOCARDIOGRAM (TEE);  Surgeon: Rexene Alberts, MD;  Location: [place];  Service: Open Heart Surgery;  Laterality: N/A;  . TONSILLECTOMY      Current Medications: Current Meds  Medication Sig  . acetaminophen (TYLENOL) 325 MG tablet Take 2 tablets (650 mg total) by mouth every 6 (six) hours as needed for mild pain, moderate pain, fever or headache.  Marland Kitchen amLODipine (NORVASC) 5 MG tablet Take 5 mg by mouth daily.  Marland Kitchen aspirin EC 81 MG tablet Take 81 mg by mouth daily.  . carvedilol (COREG) 12.5 MG tablet TAKE 1 TABLET (12.5 MG TOTAL) BY MOUTH 2 (TWO) TIMES DAILY WITH A MEAL.  Marland Kitchen Cholecalciferol (VITAMIN D-3 PO) Take 2,000 Units by mouth daily.  . folic acid (FOLVITE) 1 MG tablet Take 1 tablet (1 mg total) by mouth daily.  Marland Kitchen levothyroxine (SYNTHROID) 88 MCG tablet TAKE 1 TABLET (88 MCG TOTAL) BY MOUTH DAILY BEFORE BREAKFAST.  . methotrexate (RHEUMATREX) 2.5 MG tablet TAKE 4 TABLETS BY MOUTH ONE TIME PER WEEK  . rosuvastatin (CRESTOR) 5 MG tablet Take 1 tablet (5 mg total) by mouth daily.  Marland Kitchen spironolactone (ALDACTONE) 25 MG tablet Take 1 tablet (25 mg total) by mouth daily. PLEASE CALL TO SCHEDULE APPOINTMENT FOR FURTHER REFILLS     Allergies:   Atorvastatin and Ezetimibe   Social History   Socioeconomic History  . Marital status: Married    Spouse name: Not on file  . Number of children: 3  . Years of education: Not on file  . Highest education level: Not on file  Occupational History  . Occupation: retired- used to work for [employer]: 05/2008  Tobacco Use  . Smoking status: Former Smoker    Packs/day: 2.00    Years: 35.00    Pack years: 70.00    Quit date: 04/15/1990    Years since quitting: 29.4  . Smokeless tobacco: Never Used  . Tobacco comment: used to smoke 2 ppd  Substance and Sexual Activity  . Alcohol use: Not Currently    Comment: no ETOH since the  90s  . Drug use: No  . Sexual activity: Not on file  Other Topics Concern  . Not on file  Social History Narrative   Lives w/ wife   3 children, 8 g-kids             Social Determinants of Health   Financial Resource Strain:   . Difficulty of Paying Living Expenses:   Food Insecurity:   . Worried About Charity fundraiser in the Last Year:   . Arboriculturist in the Last Year:   Transportation Needs:   . Film/video editor (Medical):   Marland Kitchen Lack of Transportation (Non-Medical):   Physical Activity:   . Days of Exercise per Week:   . Minutes of Exercise per Session:   Stress:   . Feeling  of Stress :   Social Connections:   . Frequency of Communication with Friends and Family:   . Frequency of Social Gatherings with Friends and Family:   . Attends Religious Services:   . Active Member of Clubs or Organizations:   . Attends Archivist Meetings:   Marland Kitchen Marital Status:      Family History: The patient's family history includes COPD in his father; Coronary artery disease in his brother; Diabetes in his brother, daughter, and mother; Emphysema in his father; Stroke in his mother. There is no history of Colon cancer, Prostate cancer, Colon polyps, Esophageal cancer, Rectal cancer, or Stomach cancer. ROS:   Please see the history of present illness.    All other systems reviewed and are negative.  EKGs/Labs/Other Studies Reviewed:    The following studies were reviewed today:    Recent Labs: I reviewed his recent labs 08/24/2019 05/03/2019: Magnesium 2.1 07/12/2019: TSH 2.66 08/24/2019: ALT 14; BUN 13; Creat 1.18; Hemoglobin 13.6; Platelets 257; Potassium 4.3; Sodium 140  Recent Lipid Panel    Component Value Date/Time   CHOL 179 03/11/2019 1020   CHOL 172 02/24/2018 1150   TRIG 196.0 (H) 03/11/2019 1020   TRIG 115 05/05/2006 0913   HDL 54.00 03/11/2019 1020   HDL 61 02/24/2018 1150   CHOLHDL 3 03/11/2019 1020   VLDL 39.2 03/11/2019 1020   LDLCALC 86  03/11/2019 1020   LDLCALC 84 02/24/2018 1150   LDLDIRECT 76.5 04/07/2008 1057    Physical Exam:    VS:  BP (!) 142/70   Pulse 70   Ht 5\' 9"  (1.753 m)   Wt 186 lb (84.4 kg)   SpO2 97%   BMI 27.47 kg/m     Wt Readings from Last 3 Encounters:  09/22/19 186 lb (84.4 kg)  07/12/19 192 lb 2 oz (87.1 kg)  05/21/19 189 lb 6 oz (85.9 kg)     GEN:  Well nourished, well developed in no acute distress HEENT: Normal NECK: No JVD; No carotid bruits LYMPHATICS: No lymphadenopathy CARDIAC: RRR, no murmurs, rubs, gallops RESPIRATORY:  Clear to auscultation without rales, wheezing or rhonchi  ABDOMEN: Soft, non-tender, non-distended MUSCULOSKELETAL:  No edema; No deformity  SKIN: Warm and dry NEUROLOGIC:  Alert and oriented x 3 PSYCHIATRIC:  Normal affect    Signed, Shirlee More, MD  09/22/2019 8:25 AM    [place]

## 2019-09-22 ENCOUNTER — Other Ambulatory Visit: Payer: Self-pay

## 2019-09-22 ENCOUNTER — Encounter: Payer: Self-pay | Admitting: Cardiology

## 2019-09-22 ENCOUNTER — Ambulatory Visit: Payer: Medicare Other | Admitting: Cardiology

## 2019-09-22 VITALS — BP 142/70 | HR 70 | Ht 69.0 in | Wt 186.0 lb

## 2019-09-22 DIAGNOSIS — E785 Hyperlipidemia, unspecified: Secondary | ICD-10-CM

## 2019-09-22 DIAGNOSIS — I447 Left bundle-branch block, unspecified: Secondary | ICD-10-CM

## 2019-09-22 DIAGNOSIS — Z953 Presence of xenogenic heart valve: Secondary | ICD-10-CM | POA: Diagnosis not present

## 2019-09-22 DIAGNOSIS — I10 Essential (primary) hypertension: Secondary | ICD-10-CM | POA: Diagnosis not present

## 2019-09-22 NOTE — Patient Instructions (Signed)
Medication Instructions:  Your physician recommends that you continue on your current medications as directed. Please refer to the Current Medication list given to you today.  Hold your amlodipine if your systolic blood pressure is less than 120 mmHg *If you need a refill on your cardiac medications before your next appointment, please call your pharmacy*   Lab Work: None If you have labs (blood work) drawn today and your tests are completely normal, you will receive your results only by: Marland Kitchen MyChart Message (if you have MyChart) OR . A paper copy in the mail If you have any lab test that is abnormal or we need to change your treatment, we will call you to review the results.   Testing/Procedures: None   Follow-Up: At [facility], you and your health needs are our priority.  As part of our continuing mission to provide you with exceptional heart care, we have created designated Provider Care Teams.  These Care Teams include your primary Cardiologist (physician) and Advanced Practice Providers (APPs -  Physician Assistants and Nurse Practitioners) who all work together to provide you with the care you need, when you need it.  We recommend signing up for the patient portal called "MyChart".  Sign up information is provided on this After Visit Summary.  MyChart is used to connect with patients for Virtual Visits (Telemedicine).  Patients are able to view lab/test results, encounter notes, upcoming appointments, etc.  Non-urgent messages can be sent to your provider as well.   To learn more about what you can do with MyChart, go to [email or web address].    Your next appointment:   6 month(s)  The format for your next appointment:   In Person  Provider:   Shirlee More, MD   Other Instructions

## 2019-10-08 ENCOUNTER — Telehealth: Payer: Self-pay | Admitting: Rheumatology

## 2019-10-08 MED ORDER — FOLIC ACID 1 MG PO TABS: 1.0000 mg | ORAL_TABLET | Freq: Every day | ORAL | 2 refills | Status: DC

## 2019-10-08 NOTE — Telephone Encounter (Signed)
Patient called requesting prescription refill of Folic Acid to be sent to [facility] [place] in [place].

## 2019-10-08 NOTE — Telephone Encounter (Signed)
Last Visit: 06/29/2019 Next Visit: 10/18/2019  Okay to refill per Dr. Estanislado Pandy.

## 2019-10-11 ENCOUNTER — Encounter: Payer: Self-pay | Admitting: Internal Medicine

## 2019-10-11 ENCOUNTER — Other Ambulatory Visit: Payer: Self-pay

## 2019-10-11 ENCOUNTER — Ambulatory Visit: Payer: Medicare Other | Admitting: Internal Medicine

## 2019-10-11 VITALS — BP 120/59 | HR 69 | Temp 97.1°F | Resp 16 | Ht 69.0 in | Wt 185.0 lb

## 2019-10-11 DIAGNOSIS — E038 Other specified hypothyroidism: Secondary | ICD-10-CM

## 2019-10-11 DIAGNOSIS — G3184 Mild cognitive impairment, so stated: Secondary | ICD-10-CM | POA: Diagnosis not present

## 2019-10-11 DIAGNOSIS — I10 Essential (primary) hypertension: Secondary | ICD-10-CM | POA: Diagnosis not present

## 2019-10-11 DIAGNOSIS — S066X0S Traumatic subarachnoid hemorrhage without loss of consciousness, sequela: Secondary | ICD-10-CM

## 2019-10-11 LAB — TSH: TSH: 1.91 u[IU]/mL (ref 0.35–4.50)

## 2019-10-11 NOTE — Progress Notes (Signed)
Pre visit review using our clinic review tool, if applicable. No additional management support is needed unless otherwise documented below in the visit note. 

## 2019-10-11 NOTE — Progress Notes (Signed)
Office Visit Note  Patient: Eric [name]             Date of Birth: January 31, 1946           MRN: [id number]             PCP: Colon Branch, MD Referring: Colon Branch, MD Visit Date: 10/18/2019 Occupation: @GUAROCC @  Subjective:  Medication monitoring   History of Present Illness: Eric [name] is a 74 y.o. male with history of seronegative rheumatoid arthritis and osteoarthritis.  He is taking methotrexate 4 tablets by mouth once weekly and folic acid 1 mg by mouth daily.  He is no longer taking prednisone.  He states that he experiences intermittent pain and inflammation in his left hand and left wrist.  He denies any joint swelling at this time.  He continues to experience stiffness in his C-spine and hands in a daily basis.  He has occasional stiffness in his knee joints especially after sitting for prolonged periods of time.  He states that he has not needed to take Tylenol recently.  He is interested in increasing the dose of methotrexate.  He states that in the past when his kidney function was elevated he had recently been started on spironolactone.   Activities of Daily Living:  Patient reports morning stiffness for 5  minutes.   Patient Denies nocturnal pain.  Difficulty dressing/grooming: Denies Difficulty climbing stairs: Denies Difficulty getting out of chair: Reports Difficulty using hands for taps, buttons, cutlery, and/or writing: Denies  Review of Systems  Constitutional: Negative for fatigue and night sweats.  HENT: Negative for mouth sores, mouth dryness and nose dryness.   Eyes: Negative for redness and dryness.  Respiratory: Negative for cough, hemoptysis, shortness of breath and difficulty breathing.   Cardiovascular: Negative for chest pain, palpitations, hypertension, irregular heartbeat and swelling in legs/feet.  Gastrointestinal: Negative for constipation and diarrhea.  Endocrine: Negative for increased urination.  Genitourinary: Negative for painful  urination.  Musculoskeletal: Positive for arthralgias, joint pain and morning stiffness. Negative for joint swelling, myalgias, muscle weakness, muscle tenderness and myalgias.  Skin: Negative for color change, rash, hair loss, nodules/bumps, skin tightness, ulcers and sensitivity to sunlight.  Allergic/Immunologic: Negative for susceptible to infections.  Neurological: Negative for dizziness, fainting, memory loss, night sweats and weakness.  Hematological: Negative for swollen glands.  Psychiatric/Behavioral: Negative for depressed mood and sleep disturbance. The patient is not nervous/anxious.     PMFS History:  Patient Active Problem List   Diagnosis Date Noted  . Subarachnoid hemorrhage ([place]) 04/28/2019  . Rheumatoid arthritis ([place]) 11/17/2018  . Spinal stenosis of lumbar region 04/23/2018  . HNP (herniated nucleus pulposus), lumbar 04/23/2018  . Prolapsed lumbar disc 03/03/2018  . Wide-complex tachycardia ([place]) 12/30/2017  . Left bundle branch block (LBBB) 12/18/2017  . 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 Abnormal LFTs   . Macular edema   . PCP NOTES >>>>>> 04/27/2015  . Hyperglycemia 10/20/2014  . Dizziness 04/21/2014  . Annual physical exam 04/15/2011  . Aortic stenosis 04/15/2011  . Hypothyroidism 10/13/2007  . Hyperlipidemia 10/13/2007  . Hypertension 04/08/2007    Past Medical History:  Diagnosis Date  . Abnormal LFTs   . Anxiety   . Aortic stenosis 04/15/2011  . Dizziness 04/21/2014  . Dyspnea   . Heart murmur   . HNP (herniated nucleus pulposus), lumbar    L5-S1  . Hyperglycemia 10/20/2014  . Hyperlipidemia   . Hypertension   . Hypothyroidism   .  Left bundle branch block (LBBB) 12/18/2017  . Lumbar stenosis   . Macular edema    Dr  Sherlynn Stalls   . 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 Diabetes Mother   . Stroke Mother   . Diabetes Brother        ?  . Coronary artery disease Brother        Male 1st degree relative >50, had CABG in his 62s  . Emphysema Father   . COPD Father   . Diabetes Daughter   . Colon cancer Neg Hx   . Prostate cancer Neg Hx   . Colon polyps Neg Hx   . Esophageal cancer Neg Hx   . Rectal cancer Neg Hx   . Stomach cancer Neg Hx    Past Surgical History:  Procedure Laterality Date  . AORTIC VALVE REPLACEMENT N/A 12/17/2017   Procedure: MINIMALLY INVASIVE AORTIC VALVE REPLACEMENT (AVR) [ using Edwards Intuity Valve Size 28mm;  Surgeon: Rexene Alberts, MD;  Location: [facility];  Service: Open Heart Surgery;  Laterality: N/A;  MINI THORACOTOMY  . CARDIAC CATHETERIZATION  11/10/00  . CATARACT EXTRACTION W/ INTRAOCULAR LENS IMPLANT     RIGHT EYE  . CHOLECYSTECTOMY    . EYE SURGERY     Right eye cataract  . INGUINAL HERNIA REPAIR     (B)  . LUMBAR LAMINECTOMY/DECOMPRESSION MICRODISCECTOMY N/A 04/23/2018   Procedure: Microlumbar decompression Lumbar five-Sacral one;  Surgeon: Susa Day, MD;  Location: [facility];  Service: Orthopedics;  Laterality: N/A;  . POLYPECTOMY    . RIGHT/LEFT HEART CATH AND CORONARY ANGIOGRAPHY N/A 10/16/2017   Procedure: RIGHT/LEFT HEART CATH AND CORONARY ANGIOGRAPHY;  Surgeon: [name], MD;  Location: [facility];  Service: Cardiovascular;  Laterality: N/A;  . TEE WITHOUT CARDIOVERSION N/A 12/17/2017   Procedure: TRANSESOPHAGEAL ECHOCARDIOGRAM (TEE);  Surgeon: Rexene Alberts, MD;  Location: [facility];  Service: Open Heart Surgery;  Laterality: N/A;  . TONSILLECTOMY     Social History   Social History Narrative   Lives w/ wife   3 children, 8 g-kids             Immunization History  Administered Date(s) Administered  . Fluad Quad(high Dose 65+) 02/12/2019  . Influenza Split 03/11/2013, 02/17/2014  . Influenza  Whole 04/13/2007, 03/15/2008, 03/10/2012  . Influenza, High Dose Seasonal PF 04/04/2015, 03/28/2016, 03/18/2017, 03/16/2018  . PFIZER SARS-COV-2 Vaccination 08/08/2019, 08/31/2019  . Pneumococcal Conjugate-13 04/21/2014  . Pneumococcal Polysaccharide-23 04/15/2012  . Td 06/10/1996, 06/10/2000  . Tdap 04/15/2011, 10/11/2018  . Zoster 03/29/2015     Objective: Vital Signs: BP 125/79 (BP Location: Right Arm, Patient Position: Sitting, Cuff Size: Normal)   Pulse 68   Resp 18   Ht 5\' 7"  (1.702 m)   Wt 186 lb 12.8 oz (84.7 kg)   BMI 29.26 kg/m    Physical Exam Vitals and nursing note reviewed.  Constitutional:      Appearance: He is well-developed.  HENT:     Head: Normocephalic and atraumatic.  Eyes:     Conjunctiva/sclera: Conjunctivae normal.     Pupils: Pupils are equal, round, and reactive to light.  Pulmonary:     Effort: Pulmonary effort is normal.  Abdominal:     General: Bowel sounds are normal.  Palpations: Abdomen is soft.  Musculoskeletal:     Cervical back: Normal range of motion and neck supple.  Skin:    General: Skin is warm and dry.     Capillary Refill: Capillary refill takes less than 2 seconds.  Neurological:     Mental Status: He is alert and oriented to person, place, and time.  Psychiatric:        Behavior: Behavior normal.      Musculoskeletal Exam: C-spine limited range of motion with lateral rotation.  Thoracic and lumbar spine have good range of motion.  No midline spinal tenderness.  No SI joint tenderness.  Shoulder joints, elbow joints have good range of motion with no discomfort.  He has limited range of motion of both wrist joints.  MCPs, PIPs and DIPs good range of motion no synovitis.  He has PIP and DIP thickening consistent with osteoarthritis of both hands.  Synovial thickening of the left 2nd MCP.  Hip joints have good range of motion with no discomfort.  Knee joints have good range of motion no warmth or effusion.  Ankle joints good ROM  with no tenderness or inflammation.    CDAI Exam: CDAI Score: 0.4  Patient Global: 2 mm; Provider Global: 2 mm Swollen: 0 ; Tender: 0  Joint Exam 10/18/2019   No joint exam has been documented for this visit   There is currently no information documented on the homunculus. Go to the Rheumatology activity and complete the homunculus joint exam.  Investigation: No additional findings.  Imaging: No results found.  Recent Labs: Lab Results  Component Value Date   WBC 5.8 08/24/2019   HGB 13.6 08/24/2019   PLT 257 08/24/2019   NA 140 08/24/2019   K 4.3 08/24/2019   CL 105 08/24/2019   CO2 24 08/24/2019   GLUCOSE 91 08/24/2019   BUN 13 08/24/2019   CREATININE 1.18 08/24/2019   BILITOT 1.0 08/24/2019   ALKPHOS 68 05/21/2019   AST 22 08/24/2019   ALT 14 08/24/2019   PROT 6.9 08/24/2019   ALBUMIN 4.0 05/21/2019   CALCIUM 9.8 08/24/2019   GFRAA 71 08/24/2019   QFTBGOLDPLUS NEGATIVE 08/24/2018    Speciality Comments: No specialty comments available.  Procedures:  No procedures performed Allergies: Atorvastatin and Ezetimibe   Assessment / Plan:     Visit Diagnoses: Seronegative rheumatoid arthritis ([place]): He has no synovitis on exam.  He experiences intermittent inflammation in both hands and wrist joints.  He has synovial thickening of the left second MCP joint but no synovitis was noted.  He continues to experience morning stiffness lasting about 10 to 20 minutes on a daily basis.  He is currently taking methotrexate 4 tablets by mouth once weekly and folic acid 1 mg by mouth daily.  According to the patient he takes methotrexate every Friday and by Tuesday or Wednesday of the following week he experiences increased stiffness and discomfort in both hands and both wrist joints.  We discussed increasing the dose of methotrexate to 6 tablets by mouth once weekly and he is in agreement.  He is due to update lab work today and in 2 weeks to monitor for drug toxicity.  He was  advised to notify us if he develops increased joint pain or joint swelling.  To follow-up in the office in 3 months.   High risk medication use - MTX 6 tablets po once weekly and folic acid 2 mg po daily.  He will be increasing methotrexate from 4 tablets  weekly to 6 tablets weekly.  He will return for lab work in 2 weeks to assess for drug toxicity.  CBC and CMP are due today.  Orders were released.- Plan: CBC with Differential/Platelet, COMPLETE METABOLIC PANEL WITH GFR  Primary osteoarthritis of both hands: He has PIP and DIP thickening consistent with osteoarthritis of both hands.  He continues to experience stiffness in both hands.  Joint protection and muscle strengthening were discussed  Primary osteoarthritis of both knees: He has good range of motion of both knee joints on exam.  No warmth or effusion was noted.  Primary osteoarthritis of both feet:  He is not experiencing any pain in his feet at this time.  He is wearing proper fitting shoes.    Spinal stenosis of lumbar region, unspecified whether neurogenic claudication present: He is not experiencing any lower pain at this time.  He has no symptoms of radiculopathy.   Osteopenia of multiple sites: DEXA 04/23/2019: Right femoral neck: BMD 0.776 with T score -1.1.  He is taking calcium and vitamin D supplement.  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 CBC with Differential/Platelet  . COMPLETE METABOLIC PANEL WITH GFR   No orders of the defined types were placed in this encounter.     Follow-Up Instructions: Return in about 5 months (around 03/19/2020) for Rheumatoid arthritis, Osteoarthritis.   Ofilia Neas, PA-C  Note - This record has been  created using Dragon software.  Chart creation errors have been sought, but may not always  have been located. Such creation errors do not reflect on  the standard of medical care.

## 2019-10-11 NOTE — Assessment & Plan Note (Signed)
HTN: Was recommended by cardiology to hold CCB's if the systolic BP is less than 123456.  Good compliance with amlodipine, carvedilol, Aldactone, ambulatory BPs in the 120, 130 range.  Very rarely less than that.  Denies orthostatic symptoms at this point Aortic stenosis: Saw cardiology 09/22/2019, note reviewed: S/p  Minimally invasive aortic valve replacement: Felt to be stable Status post Covid, status post SAH: Wife reported memory issues (MCI?), also in the past he reported lightheadedness and a brain fog, at this point he feels and looks better, MMSE scored 28 which is pretty good. Hypothyroidism: Check a TSH. RTC 3 to 4 months.

## 2019-10-11 NOTE — Patient Instructions (Addendum)
Please schedule Medicare Wellness with Glenard Haring.        GO TO THE LAB : Get the blood work     Cook, Connell back for a checkup in 3 to 4 months

## 2019-10-11 NOTE — Progress Notes (Signed)
Subjective:    Patient ID: Eric [name], male    DOB: 1945/09/07, 74 y.o.   MRN: [id number]  DOS:  10/11/2019 Type of visit - description: Here for follow-up In general feeling well. We talked about hypertension, he is recent medical problems including Covid and  intracranial bleed. Patient's wife is my patient, she reported Wick is having memory issues.     Review of Systems Denies anxiety depression although from time to time he feels slightly down. He used to have orthostatic dizziness, that is much improved.   Past Medical History:  Diagnosis Date  . Abnormal LFTs   . Anxiety   . Aortic stenosis 04/15/2011  . Dizziness 04/21/2014  . Dyspnea   . Heart murmur   . HNP (herniated nucleus pulposus), lumbar    L5-S1  . Hyperglycemia 10/20/2014  . Hyperlipidemia   . Hypertension   . Hypothyroidism   . Left bundle branch block (LBBB) 12/18/2017  . Lumbar stenosis   . Macular edema    Dr  Sherlynn Stalls   . 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 AORTIC VALVE REPLACEMENT N/A 12/17/2017   Procedure: MINIMALLY INVASIVE AORTIC VALVE REPLACEMENT (AVR) [ using Edwards Intuity Valve Size 51mm;  Surgeon: Rexene Alberts, MD;  Location: [facility];  Service: Open Heart Surgery;  Laterality: N/A;  MINI THORACOTOMY  . CARDIAC CATHETERIZATION  11/10/00  . CATARACT EXTRACTION W/ INTRAOCULAR LENS IMPLANT     RIGHT EYE  . CHOLECYSTECTOMY    . EYE SURGERY     Right eye cataract  . INGUINAL HERNIA REPAIR     (B)  . LUMBAR LAMINECTOMY/DECOMPRESSION MICRODISCECTOMY N/A 04/23/2018   Procedure: Microlumbar decompression Lumbar five-Sacral one;  Surgeon: Susa Day, MD;  Location: [facility];  Service: Orthopedics;  Laterality: N/A;  . POLYPECTOMY    . RIGHT/LEFT HEART CATH AND  CORONARY ANGIOGRAPHY N/A 10/16/2017   Procedure: RIGHT/LEFT HEART CATH AND CORONARY ANGIOGRAPHY;  Surgeon: [name], MD;  Location: [facility];  Service: Cardiovascular;  Laterality: N/A;  . TEE WITHOUT CARDIOVERSION N/A 12/17/2017   Procedure: TRANSESOPHAGEAL ECHOCARDIOGRAM (TEE);  Surgeon: Rexene Alberts, MD;  Location: [facility];  Service: Open Heart Surgery;  Laterality: N/A;  . TONSILLECTOMY      Allergies as of 10/11/2019      Reactions   Atorvastatin Other (See Comments)   REACTION: myalgia   Ezetimibe Other (See Comments)   REACTION: myalgia      Medication List       Accurate as of Oct 11, 2019  3:30 PM. If you have any questions, ask your nurse or doctor.        acetaminophen 325 MG tablet Commonly known as: TYLENOL Take 2 tablets (650 mg total) by mouth every 6 (six) hours as needed for mild pain, moderate pain, fever or headache.   amLODipine 5 MG tablet Commonly known as: NORVASC Take 5 mg by mouth daily.   aspirin EC 81 MG tablet Take 81 mg by mouth daily.   carvedilol 12.5 MG tablet Commonly known as: COREG TAKE 1 TABLET (12.5 MG TOTAL) BY MOUTH 2 (TWO) TIMES DAILY WITH A MEAL.   folic acid 1 MG tablet Commonly known as: FOLVITE Take 1 tablet (1 mg total) by mouth daily.   levothyroxine 88 MCG tablet Commonly  known as: SYNTHROID TAKE 1 TABLET (88 MCG TOTAL) BY MOUTH DAILY BEFORE BREAKFAST.   methotrexate 2.5 MG tablet Commonly known as: RHEUMATREX TAKE 4 TABLETS BY MOUTH ONE TIME PER WEEK   rosuvastatin 5 MG tablet Commonly known as: CRESTOR Take 1 tablet (5 mg total) by mouth daily.   spironolactone 25 MG tablet Commonly known as: ALDACTONE Take 1 tablet (25 mg total) by mouth daily. PLEASE CALL TO SCHEDULE APPOINTMENT FOR FURTHER REFILLS   VITAMIN D-3 PO Take 2,000 Units by mouth daily.          Objective:   Physical Exam BP (!) 120/59 (BP Location: Left Arm, Patient Position: Sitting, Cuff Size: Normal)   Pulse 69   Temp (!)  97.1 F (36.2 C) (Temporal)   Resp 16   Ht 5\' 9"  (1.753 m)   Wt 185 lb (83.9 kg)   SpO2 98%   BMI 27.32 kg/m  General:   Well developed, NAD, BMI noted. HEENT:  Normocephalic . Face symmetric, atraumatic Skin: Not pale. Not jaundice Neurologic:  alert & oriented X3.  MMSE: 28 Speech normal, gait appropriate for age and unassisted Psych--  Cognition and judgment appear intact.  Cooperative with normal attention span and concentration.  Behavior appropriate. No anxious or depressed appearing.  Seems in good spirits     Assessment     Assessment HTN Hyperlipidemia Hypothyroidism R.A. dx 2020 Increase LFTs Ultrasound 2002 done for increased LFTs: Normal liver; Hep C negative 02-16-15 Aortic stenosis , dx  2012 , s/p AoVR 12/2017 Macular degeneration, cataracts  +FH CAD  --Pt's Brother has CAD dx age 48s --Patient himself has a cardiac catheterization with minimal to no disease 10/2017  PLAN HTN: Was recommended by cardiology to hold CCB's if the systolic BP is less than 123456.  Good compliance with amlodipine, carvedilol, Aldactone, ambulatory BPs in the 120, 130 range.  Very rarely less than that.  Denies orthostatic symptoms at this point Aortic stenosis: Saw cardiology 09/22/2019, note reviewed: S/p  Minimally invasive aortic valve replacement: Felt to be stable Status post Covid, status post SAH: Wife reported memory issues (MCI?), also in the past he reported lightheadedness and a brain fog, at this point he feels and looks better, MMSE scored 28 which is pretty good. Hypothyroidism: Check a TSH. RTC 3 to 4 months.   This visit occurred during the SARS-CoV-2 public health emergency.  Safety protocols were in place, including screening questions prior to the visit, additional usage of staff PPE, and extensive cleaning of exam room while observing appropriate contact time as indicated for disinfecting solutions.

## 2019-10-18 ENCOUNTER — Other Ambulatory Visit: Payer: Self-pay

## 2019-10-18 ENCOUNTER — Encounter: Payer: Self-pay | Admitting: Physician Assistant

## 2019-10-18 ENCOUNTER — Ambulatory Visit (INDEPENDENT_AMBULATORY_CARE_PROVIDER_SITE_OTHER): Payer: Medicare Other | Admitting: Physician Assistant

## 2019-10-18 VITALS — BP 125/79 | HR 68 | Resp 18 | Ht 67.0 in | Wt 186.8 lb

## 2019-10-18 DIAGNOSIS — M170 Bilateral primary osteoarthritis of knee: Secondary | ICD-10-CM

## 2019-10-18 DIAGNOSIS — Z79899 Other long term (current) drug therapy: Secondary | ICD-10-CM

## 2019-10-18 DIAGNOSIS — Z953 Presence of xenogenic heart valve: Secondary | ICD-10-CM

## 2019-10-18 DIAGNOSIS — I447 Left bundle-branch block, unspecified: Secondary | ICD-10-CM

## 2019-10-18 DIAGNOSIS — M19071 Primary osteoarthritis, right ankle and foot: Secondary | ICD-10-CM

## 2019-10-18 DIAGNOSIS — M0600 Rheumatoid arthritis without rheumatoid factor, unspecified site: Secondary | ICD-10-CM

## 2019-10-18 DIAGNOSIS — M19041 Primary osteoarthritis, right hand: Secondary | ICD-10-CM | POA: Diagnosis not present

## 2019-10-18 DIAGNOSIS — Z8659 Personal history of other mental and behavioral disorders: Secondary | ICD-10-CM

## 2019-10-18 DIAGNOSIS — I10 Essential (primary) hypertension: Secondary | ICD-10-CM

## 2019-10-18 DIAGNOSIS — M19072 Primary osteoarthritis, left ankle and foot: Secondary | ICD-10-CM

## 2019-10-18 DIAGNOSIS — Z8639 Personal history of other endocrine, nutritional and metabolic disease: Secondary | ICD-10-CM

## 2019-10-18 DIAGNOSIS — M19042 Primary osteoarthritis, left hand: Secondary | ICD-10-CM

## 2019-10-18 DIAGNOSIS — M8589 Other specified disorders of bone density and structure, multiple sites: Secondary | ICD-10-CM

## 2019-10-18 DIAGNOSIS — Z840 Family history of diseases of the skin and subcutaneous tissue: Secondary | ICD-10-CM

## 2019-10-18 DIAGNOSIS — M48061 Spinal stenosis, lumbar region without neurogenic claudication: Secondary | ICD-10-CM

## 2019-10-18 LAB — COMPLETE METABOLIC PANEL WITH GFR
AG Ratio: 1.7 (calc) (ref 1.0–2.5)
ALT: 18 U/L (ref 9–46)
AST: 25 U/L (ref 10–35)
Albumin: 4.3 g/dL (ref 3.6–5.1)
Alkaline phosphatase (APISO): 61 U/L (ref 35–144)
BUN/Creatinine Ratio: 12 (calc) (ref 6–22)
BUN: 15 mg/dL (ref 7–25)
CO2: 25 mmol/L (ref 20–32)
Calcium: 9.5 mg/dL (ref 8.6–10.3)
Chloride: 106 mmol/L (ref 98–110)
Creat: 1.22 mg/dL — ABNORMAL HIGH (ref 0.70–1.18)
GFR, Est African American: 68 mL/min/{1.73_m2} (ref >=60)
GFR, Est Non African American: 58 mL/min/{1.73_m2} — ABNORMAL LOW (ref >=60)
Globulin: 2.5 g/dL (calc) (ref 1.9–3.7)
Glucose, Bld: 102 mg/dL — ABNORMAL HIGH (ref 65–99)
Potassium: 4.3 mmol/L (ref 3.5–5.3)
Sodium: 139 mmol/L (ref 135–146)
Total Bilirubin: 1.1 mg/dL (ref 0.2–1.2)
Total Protein: 6.8 g/dL (ref 6.1–8.1)

## 2019-10-18 LAB — CBC WITH DIFFERENTIAL/PLATELET
Absolute Monocytes: 456 cells/uL (ref 200–950)
Basophils Absolute: 9 cells/uL (ref 0–200)
Basophils Relative: 0.2 %
Eosinophils Absolute: 52 cells/uL (ref 15–500)
Eosinophils Relative: 1.1 %
HCT: 41 % (ref 38.5–50.0)
Hemoglobin: 13.7 g/dL (ref 13.2–17.1)
Lymphs Abs: 1109 cells/uL (ref 850–3900)
MCH: 31.9 pg (ref 27.0–33.0)
MCHC: 33.4 g/dL (ref 32.0–36.0)
MCV: 95.3 fL (ref 80.0–100.0)
MPV: 10.1 fL (ref 7.5–12.5)
Monocytes Relative: 9.7 %
Neutro Abs: 3074 cells/uL (ref 1500–7800)
Neutrophils Relative %: 65.4 %
Platelets: 232 10*3/uL (ref 140–400)
RBC: 4.3 10*6/uL (ref 4.20–5.80)
RDW: 14.1 % (ref 11.0–15.0)
Total Lymphocyte: 23.6 %
WBC: 4.7 10*3/uL (ref 3.8–10.8)

## 2019-10-18 MED ORDER — METHOTREXATE 2.5 MG PO TABS: ORAL_TABLET | ORAL | 0 refills | Status: DC

## 2019-10-18 NOTE — Addendum Note (Signed)
Addended by: Francis Gaines C on: 10/18/2019 04:00 PM   Modules accepted: Orders

## 2019-10-18 NOTE — Patient Instructions (Addendum)
Standing Labs We placed an order today for your standing lab work.    Please come back and get your standing labs in 2 weeks and every 3 months   We have open lab daily Monday through Thursday from 8:30-12:30 PM and 1:30-4:30 PM and Friday from 8:30-12:30 PM and 1:30-4:00 PM at the office of Dr. [name].   You may experience shorter wait times on Monday and Friday afternoons. The office is located at [address] [place], [place], [place] [address] No appointment is necessary.   Labs are drawn by [facility].  You may receive a bill from [facility] for your lab work.  If you wish to have your labs drawn at another location, please call the office 24 hours in advance to send orders.  If you have any questions regarding directions or hours of operation,  please call [phone].   Just as a reminder please drink plenty of water prior to coming for your lab work. Thanks!

## 2019-10-19 NOTE — Progress Notes (Signed)
CBC WNL. Creatinine is borderline elevated-1.22 and GFR is borderline low at 58.  Rest of CMP WNL. He is taking MTX 4 tablet po once weekly.  We discussed increasing the dose of MTX to 6 tabs weekly at his appt yesterday since his renal function returned to [facility] prior to updated lab work yesterday.  Dr. Estanislado Pandy would like the patient to continue on MTX 4 tablets once weekly.    If he continues to have increased joint pain and joint swelling, Arava will be a treatment option that we can discuss.

## 2019-11-22 ENCOUNTER — Other Ambulatory Visit: Payer: Self-pay | Admitting: Cardiology

## 2019-11-29 ENCOUNTER — Other Ambulatory Visit: Payer: Self-pay | Admitting: Rheumatology

## 2019-11-29 ENCOUNTER — Other Ambulatory Visit: Payer: Self-pay | Admitting: Cardiology

## 2019-11-30 ENCOUNTER — Other Ambulatory Visit: Payer: Self-pay | Admitting: Internal Medicine

## 2019-12-01 ENCOUNTER — Other Ambulatory Visit: Payer: Self-pay | Admitting: *Deleted

## 2019-12-01 MED ORDER — METHOTREXATE 2.5 MG PO TABS: 10.0000 mg | ORAL_TABLET | ORAL | 0 refills | Status: DC

## 2019-12-01 NOTE — Telephone Encounter (Signed)
Patient contacted the office requesting a refill on MTX.   Last Visit: 10/18/2019 Next Visit: 01/18/2020 Labs: 10/18/2019 CBC WNL. Creatinine is borderline elevated-1.22 and GFR is borderline low at 58. Rest of CMP WNL. He is taking MTX 4 tablet po once weekly. We discussed increasing the dose of MTX to 6 tabs weekly at his appt yesterday since his renal function returned to [facility] prior to updated lab work yesterday. Dr. Estanislado Pandy would like the patient to continue on MTX 4 tablets once weekly.    DX: Seronegative rheumatoid arthritis   Okay to refill MTX?

## 2019-12-01 NOTE — Telephone Encounter (Signed)
Verified verbalized prescription with Dr. Estanislado Pandy. Methotrexate 2.5 mg tablets Sig: Take 4 tablets by mouth once weekly # 48 with no refills.

## 2019-12-01 NOTE — Telephone Encounter (Signed)
Please send the prescription for methotrexate 4 tablets p.o. weekly.  The dose of methotrexate was decreased due to elevated creatinine.

## 2019-12-06 ENCOUNTER — Other Ambulatory Visit: Payer: Self-pay | Admitting: Cardiology

## 2019-12-22 ENCOUNTER — Encounter: Payer: Self-pay | Admitting: Internal Medicine

## 2019-12-31 ENCOUNTER — Encounter: Payer: Self-pay | Admitting: Gastroenterology

## 2020-01-04 NOTE — Progress Notes (Deleted)
Office Visit Note  Patient: Eric [name]             Date of Birth: Oct 06, 1945           MRN: [id number]             PCP: Colon Branch, MD Referring: Colon Branch, MD Visit Date: 01/18/2020 Occupation: @GUAROCC @  Subjective:  No chief complaint on file.   History of Present Illness: Eric [name] is a 74 y.o. male ***   Activities of Daily Living:  Patient reports morning stiffness for *** {minute/hour:19697}.   Patient {ACTIONS;DENIES/REPORTS:21021675::"Denies"} nocturnal pain.  Difficulty dressing/grooming: {ACTIONS;DENIES/REPORTS:21021675::"Denies"} Difficulty climbing stairs: {ACTIONS;DENIES/REPORTS:21021675::"Denies"} Difficulty getting out of chair: {ACTIONS;DENIES/REPORTS:21021675::"Denies"} Difficulty using hands for taps, buttons, cutlery, and/or writing: {ACTIONS;DENIES/REPORTS:21021675::"Denies"}  No Rheumatology ROS completed.   PMFS History:  Patient Active Problem List   Diagnosis Date Noted  . Subarachnoid hemorrhage ([place]) 04/28/2019  . Rheumatoid arthritis ([place]) 11/17/2018  . Spinal stenosis of lumbar region 04/23/2018  . HNP (herniated nucleus pulposus), lumbar 04/23/2018  . Prolapsed lumbar disc 03/03/2018  . Wide-complex tachycardia ([place]) 12/30/2017  . Left bundle branch block (LBBB) 12/18/2017  . 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 Abnormal LFTs   . Macular edema   . PCP NOTES >>>>>> 04/27/2015  . Hyperglycemia 10/20/2014  . Dizziness 04/21/2014  . Annual physical exam 04/15/2011  . Aortic stenosis 04/15/2011  . Hypothyroidism 10/13/2007  . Hyperlipidemia 10/13/2007  . Hypertension 04/08/2007    Past Medical History:  Diagnosis Date  . Abnormal LFTs   . Anxiety   . Aortic stenosis 04/15/2011  . Dizziness 04/21/2014  . Dyspnea   . Heart murmur   . HNP (herniated nucleus pulposus), lumbar    L5-S1  . Hyperglycemia 10/20/2014  . Hyperlipidemia   . Hypertension   . Hypothyroidism   . Left  bundle branch block (LBBB) 12/18/2017  . Lumbar stenosis   . Macular edema    Dr  Sherlynn Stalls   . 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 Diabetes Mother   . Stroke Mother   . Diabetes Brother        ?  . Coronary artery disease Brother        Male 1st degree relative >50, had CABG in his 1s  . Emphysema Father   . COPD Father   . Diabetes Daughter   . Colon cancer Neg Hx   . Prostate cancer Neg Hx   . Colon polyps Neg Hx   . Esophageal cancer Neg Hx   . Rectal cancer Neg Hx   . Stomach cancer Neg Hx    Past Surgical History:  Procedure Laterality Date  . AORTIC VALVE REPLACEMENT N/A 12/17/2017   Procedure: MINIMALLY INVASIVE AORTIC VALVE REPLACEMENT (AVR) [ using Edwards Intuity Valve Size 18mm;  Surgeon: Rexene Alberts, MD;  Location: [facility];  Service: Open Heart Surgery;  Laterality: N/A;  MINI THORACOTOMY  . CARDIAC CATHETERIZATION  11/10/00  . CATARACT EXTRACTION W/ INTRAOCULAR LENS IMPLANT     RIGHT EYE  . CHOLECYSTECTOMY    . EYE SURGERY     Right eye cataract  . INGUINAL HERNIA REPAIR     (B)  . LUMBAR LAMINECTOMY/DECOMPRESSION MICRODISCECTOMY N/A 04/23/2018   Procedure: Microlumbar decompression Lumbar five-Sacral one;  Surgeon: Susa Day, MD;  Location: [facility];  Service: Orthopedics;  Laterality: N/A;  . POLYPECTOMY    . RIGHT/LEFT HEART CATH AND CORONARY ANGIOGRAPHY N/A 10/16/2017   Procedure: RIGHT/LEFT HEART CATH AND CORONARY ANGIOGRAPHY;  Surgeon: [name], MD;  Location: [facility];  Service: Cardiovascular;  Laterality: N/A;  . TEE WITHOUT CARDIOVERSION N/A 12/17/2017   Procedure: TRANSESOPHAGEAL ECHOCARDIOGRAM (TEE);  Surgeon: Rexene Alberts, MD;  Location: [facility];  Service: Open Heart Surgery;  Laterality: N/A;  . TONSILLECTOMY       Social History   Social History Narrative   Lives w/ wife   3 children, 8 g-kids             Immunization History  Administered Date(s) Administered  . Fluad Quad(high Dose 65+) 02/12/2019  . Influenza Split 03/11/2013, 02/17/2014  . Influenza Whole 04/13/2007, 03/15/2008, 03/10/2012  . Influenza, High Dose Seasonal PF 04/04/2015, 03/28/2016, 03/18/2017, 03/16/2018  . PFIZER SARS-COV-2 Vaccination 08/08/2019, 08/31/2019  . Pneumococcal Conjugate-13 04/21/2014  . Pneumococcal Polysaccharide-23 04/15/2012  . Td 06/10/1996, 06/10/2000  . Tdap 04/15/2011, 10/11/2018  . Zoster 03/29/2015     Objective: Vital Signs: There were no vitals taken for this visit.   Physical Exam   Musculoskeletal Exam: ***  CDAI Exam: CDAI Score: -- Patient Global: --; Provider Global: -- Swollen: --; Tender: -- Joint Exam 01/18/2020   No joint exam has been documented for this visit   There is currently no information documented on the homunculus. Go to the Rheumatology activity and complete the homunculus joint exam.  Investigation: No additional findings.  Imaging: No results found.  Recent Labs: Lab Results  Component Value Date   WBC 4.7 10/18/2019   HGB 13.7 10/18/2019   PLT 232 10/18/2019   NA 139 10/18/2019   K 4.3 10/18/2019   CL 106 10/18/2019   CO2 25 10/18/2019   GLUCOSE 102 (H) 10/18/2019   BUN 15 10/18/2019   CREATININE 1.22 (H) 10/18/2019   BILITOT 1.1 10/18/2019   ALKPHOS 68 05/21/2019   AST 25 10/18/2019   ALT 18 10/18/2019   PROT 6.8 10/18/2019   ALBUMIN 4.0 05/21/2019   CALCIUM 9.5 10/18/2019   GFRAA 68 10/18/2019   QFTBGOLDPLUS NEGATIVE 08/24/2018    Speciality Comments: No specialty comments available.  Procedures:  No procedures performed Allergies: Atorvastatin and Ezetimibe   Assessment / Plan:     Visit Diagnoses: No diagnosis found.  Orders: No orders of the defined types were placed in this encounter.  No orders of the defined types  were placed in this encounter.   Face-to-face time spent with patient was *** minutes. Greater than 50% of time was spent in counseling and coordination of care.  Follow-Up Instructions: No follow-ups on file.   Earnestine Mealing, CMA  Note - This record has been created using Editor, commissioning.  Chart creation errors have been sought, but may not always  have been located. Such creation errors do not reflect on  the standard of medical care.

## 2020-01-18 ENCOUNTER — Ambulatory Visit: Payer: Medicare Other | Admitting: Physician Assistant

## 2020-01-26 NOTE — Progress Notes (Signed)
Office Visit Note  Patient: Eric [name]             Date of Birth: Aug 04, 1945           MRN: [id number]             PCP: Colon Branch, MD Referring: Colon Branch, MD Visit Date: 02/09/2020 Occupation: @GUAROCC @  Subjective:  Medication monitoring.   History of Present Illness: Eric [name] is a 74 y.o. male with rheumatoid arthritis and osteoarthritis overlap.  He is currently on methotrexate 4 tablets p.o. weekly.  He states that tablet 4 tablets of methotrexate has been working well for him without any pain or swelling.  He has some stiffness in his joints after doing routine work but no discomfort.  Activities of Daily Living:  Patient reports morning stiffness for a few minutes.   Patient Denies nocturnal pain.  Difficulty dressing/grooming: Denies Difficulty climbing stairs: Denies Difficulty getting out of chair: Reports Difficulty using hands for taps, buttons, cutlery, and/or writing: Denies  Review of Systems  Constitutional: Negative for fatigue.  HENT: Negative for mouth sores, mouth dryness and nose dryness.   Eyes: Negative for itching and dryness.  Respiratory: Positive for shortness of breath. Negative for difficulty breathing.   Cardiovascular: Negative for chest pain and palpitations.  Gastrointestinal: Negative for blood in stool, constipation and diarrhea.  Endocrine: Negative for increased urination.  Genitourinary: Negative for difficulty urinating.  Musculoskeletal: Positive for arthralgias, joint pain, joint swelling and morning stiffness. Negative for myalgias, muscle tenderness and myalgias.  Skin: Negative for color change, rash and redness.  Allergic/Immunologic: Negative for susceptible to infections.  Neurological: Positive for weakness. Negative for dizziness, numbness, headaches and memory loss.  Hematological: Positive for bruising/bleeding tendency.  Psychiatric/Behavioral: Negative for confusion.    PMFS History:  Patient Active Problem  List   Diagnosis Date Noted  . Subarachnoid hemorrhage ([place]) 04/28/2019  . Rheumatoid arthritis ([place]) 11/17/2018  . Spinal stenosis of lumbar region 04/23/2018  . HNP (herniated nucleus pulposus), lumbar 04/23/2018  . Prolapsed lumbar disc 03/03/2018  . Wide-complex tachycardia ([place]) 12/30/2017  . Left bundle branch block (LBBB) 12/18/2017  . 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 Abnormal LFTs   . Macular edema   . PCP NOTES >>>>>> 04/27/2015  . Hyperglycemia 10/20/2014  . Dizziness 04/21/2014  . Annual physical exam 04/15/2011  . Aortic stenosis 04/15/2011  . Hypothyroidism 10/13/2007  . Hyperlipidemia 10/13/2007  . Hypertension 04/08/2007    Past Medical History:  Diagnosis Date  . Abnormal LFTs   . Anxiety   . Aortic stenosis 04/15/2011  . Arthritis    generalized  . Cataract    sx 2018  . Dizziness 04/21/2014  . Dyspnea   . GERD (gastroesophageal reflux disease)    with certain foods  . Heart murmur   . HNP (herniated nucleus pulposus), lumbar    L5-S1  . Hyperglycemia 10/20/2014  . Hyperlipidemia    on meds  . Hypertension    on meds  . Hypothyroidism    on meds  . Left bundle branch block (LBBB) 12/18/2017  . Lumbar stenosis   . Macular edema    Dr  Sherlynn Stalls   . 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 Diabetes Mother   .  Stroke Mother   . Diabetes Brother        ?  . Coronary artery disease Brother        Male 1st degree relative >50, had CABG in his 79s  . Emphysema Father   . COPD Father   . Diabetes Daughter   . Colon cancer Neg Hx   . Prostate cancer Neg Hx   . Colon polyps Neg Hx   . Esophageal cancer Neg Hx   . Rectal cancer Neg Hx   . Stomach cancer Neg Hx    Past Surgical  History:  Procedure Laterality Date  . AORTIC VALVE REPLACEMENT N/A 12/17/2017   Procedure: MINIMALLY INVASIVE AORTIC VALVE REPLACEMENT (AVR) [ using Edwards Intuity Valve Size 33mm;  Surgeon: Rexene Alberts, MD;  Location: [facility];  Service: Open Heart Surgery;  Laterality: N/A;  MINI THORACOTOMY  . CARDIAC CATHETERIZATION  11/10/00  . CATARACT EXTRACTION W/ INTRAOCULAR LENS IMPLANT     RIGHT EYE  . CHOLECYSTECTOMY  2008  . COLONOSCOPY  2017   TA  . EYE SURGERY Right 2018  . INGUINAL HERNIA REPAIR Bilateral 2008  . LUMBAR LAMINECTOMY/DECOMPRESSION MICRODISCECTOMY N/A 04/23/2018   Procedure: Microlumbar decompression Lumbar five-Sacral one;  Surgeon: Susa Day, MD;  Location: [facility];  Service: Orthopedics;  Laterality: N/A;  . POLYPECTOMY  2017   TA  . RIGHT/LEFT HEART CATH AND CORONARY ANGIOGRAPHY N/A 10/16/2017   Procedure: RIGHT/LEFT HEART CATH AND CORONARY ANGIOGRAPHY;  Surgeon: [name], MD;  Location: [facility];  Service: Cardiovascular;  Laterality: N/A;  . TEE WITHOUT CARDIOVERSION N/A 12/17/2017   Procedure: TRANSESOPHAGEAL ECHOCARDIOGRAM (TEE);  Surgeon: Rexene Alberts, MD;  Location: [facility];  Service: Open Heart Surgery;  Laterality: N/A;  . [place] History Narrative   Lives w/ wife   3 children, 8 g-kids             Immunization History  Administered Date(s) Administered  . Fluad Quad(high Dose 65+) 02/12/2019  . Influenza Split 03/11/2013, 02/17/2014  . Influenza Whole 04/13/2007, 03/15/2008, 03/10/2012  . Influenza, High Dose Seasonal PF 04/04/2015, 03/28/2016, 03/18/2017, 03/16/2018  . PFIZER SARS-COV-2 Vaccination 08/08/2019, 08/31/2019  . Pneumococcal Conjugate-13 04/21/2014  . Pneumococcal Polysaccharide-23 04/15/2012  . Td 06/10/1996, 06/10/2000  . Tdap 04/15/2011, 10/11/2018  . Zoster 03/29/2015     Objective: Vital Signs: BP 133/77 (BP Location: Left Arm, Patient Position: Sitting, Cuff Size:  Normal)   Pulse 64   Resp 16   Ht 5\' 7"  (1.702 m)   Wt 189 lb 12.8 oz (86.1 kg)   BMI 29.73 kg/m    Physical Exam Vitals and nursing note reviewed.  Constitutional:      Appearance: He is well-developed.  HENT:     Head: Normocephalic and atraumatic.  Eyes:     Conjunctiva/sclera: Conjunctivae normal.     Pupils: Pupils are equal, round, and reactive to light.  Cardiovascular:     Rate and Rhythm: Normal rate and regular rhythm.     Heart sounds: Normal heart sounds.  Pulmonary:     Effort: Pulmonary effort is normal.     Breath sounds: Normal breath sounds.  Abdominal:     General: Bowel sounds are normal.     Palpations: Abdomen is soft.  Musculoskeletal:     Cervical back: Normal range of motion and neck supple.  Skin:    General: Skin is warm and dry.     Capillary  Refill: Capillary refill takes less than 2 seconds.  Neurological:     Mental Status: He is alert and oriented to person, place, and time.  Psychiatric:        Behavior: Behavior normal.      Musculoskeletal Exam: C-spine was in good range of motion.  Shoulder joints, elbow joints, wrist joints with good range of motion.  He had bilateral PIP and DIP thickening.  Hip joints, knee joints, ankles with good range of motion.  He had no tenderness across MTPs.  He had discomfort range of motion of his lumbar spine.  CDAI Exam: CDAI Score: 0.4  Patient Global: 2 mm; Provider Global: 2 mm Swollen: 0 ; Tender: 0  Joint Exam 02/09/2020   No joint exam has been documented for this visit   There is currently no information documented on the homunculus. Go to the Rheumatology activity and complete the homunculus joint exam.  Investigation: No additional findings.  Imaging: No results found.  Recent Labs: Lab Results  Component Value Date   WBC 4.7 10/18/2019   HGB 13.7 10/18/2019   PLT 232 10/18/2019   NA 139 10/18/2019   K 4.3 10/18/2019   CL 106 10/18/2019   CO2 25 10/18/2019   GLUCOSE 102 (H)  10/18/2019   BUN 15 10/18/2019   CREATININE 1.22 (H) 10/18/2019   BILITOT 1.1 10/18/2019   ALKPHOS 68 05/21/2019   AST 25 10/18/2019   ALT 18 10/18/2019   PROT 6.8 10/18/2019   ALBUMIN 4.0 05/21/2019   CALCIUM 9.5 10/18/2019   GFRAA 68 10/18/2019   QFTBGOLDPLUS NEGATIVE 08/24/2018    Speciality Comments: No specialty comments available.  Procedures:  No procedures performed Allergies: Atorvastatin and Ezetimibe   Assessment / Plan:     Visit Diagnoses: Seronegative rheumatoid arthritis (HCC)-he had no synovitis on the examination today.  He has been tolerating methotrexate well.  He has some underlying osteoarthritis which causes discomfort.  High risk medication use - MTX 4 tablets po once weekly and folic acid 1 mg po daily.  His labs are stable except creatinine being slightly elevated..  We will continue to monitor labs every 3 months.  Primary osteoarthritis of both hands-joint protection muscle strengthening was discussed.  Primary osteoarthritis of both knees-he is currently not having much discomfort in his knee joints.  Primary osteoarthritis of both feet-proper fitting shoes were discussed.  Spinal stenosis of lumbar region, unspecified whether neurogenic claudication present-he continues to have some chronic pain.  Osteopenia of multiple sites-use of calcium, vitamin D and resistive exercises were discussed.  His last bone density was in 2020.  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  I have advised him to use mask, practice social distancing and hand hygiene.  He was also advised to get COVID-19 booster.  Orders: Orders Placed This Encounter  Procedures  . CBC with Differential/Platelet  . COMPLETE METABOLIC PANEL WITH GFR   No orders of the  defined types were placed in this encounter.    Follow-Up Instructions: Return in about 5 months (around 07/11/2020) for Rheumatoid arthritis, Osteoarthritis.   [name], MD  Note - This record has been created using Editor, commissioning.  Chart creation errors have been sought, but may not always  have been located. Such creation errors do not reflect on  the standard  of medical care.

## 2020-01-28 ENCOUNTER — Ambulatory Visit (AMBULATORY_SURGERY_CENTER): Payer: Self-pay

## 2020-01-28 ENCOUNTER — Other Ambulatory Visit: Payer: Self-pay

## 2020-01-28 VITALS — Ht 67.0 in | Wt 190.2 lb

## 2020-01-28 DIAGNOSIS — Z86010 Personal history of colonic polyps: Secondary | ICD-10-CM

## 2020-01-28 MED ORDER — PLENVU 140 G PO SOLR: 1.0000 | ORAL | 0 refills | Status: DC

## 2020-01-28 NOTE — Progress Notes (Signed)
No egg or soy allergy known to patient  No issues with past sedation with any surgeries or procedures no intubation problems in the past  No FH of Malignant Hyperthermia No diet pills per patient No home 02 use per patient  No blood thinners per patient  Pt denies issues with constipation  No A fib or A flutter  EMMI video via [place] 19 guidelines implemented in PV today with Pt and RN  Coupon given to pt in PV today , Code to Pharmacy  Due to the COVID-19 pandemic we are asking patients to follow these guidelines. Please only bring one care partner. Please be aware that your care partner may wait in the car in the parking lot or if they feel like they will be too hot to wait in the car, they may wait in the lobby on the 4th floor. All care partners are required to wear a mask the entire time (we do not have any that we can provide them), they need to practice social distancing, and we will do a Covid check for all patient's and care partners when you arrive. Also we will check their temperature and your temperature. If the care partner waits in their car they need to stay in the parking lot the entire time and we will call them on their cell phone when the patient is ready for discharge so they can bring the car to the front of the building. Also all patient's will need to wear a mask into building.

## 2020-02-09 ENCOUNTER — Other Ambulatory Visit: Payer: Self-pay

## 2020-02-09 ENCOUNTER — Encounter: Payer: Self-pay | Admitting: Physician Assistant

## 2020-02-09 ENCOUNTER — Ambulatory Visit: Payer: Medicare Other | Admitting: Rheumatology

## 2020-02-09 VITALS — BP 133/77 | HR 64 | Resp 16 | Ht 67.0 in | Wt 189.8 lb

## 2020-02-09 DIAGNOSIS — M19041 Primary osteoarthritis, right hand: Secondary | ICD-10-CM | POA: Diagnosis not present

## 2020-02-09 DIAGNOSIS — M19072 Primary osteoarthritis, left ankle and foot: Secondary | ICD-10-CM

## 2020-02-09 DIAGNOSIS — Z953 Presence of xenogenic heart valve: Secondary | ICD-10-CM

## 2020-02-09 DIAGNOSIS — M48061 Spinal stenosis, lumbar region without neurogenic claudication: Secondary | ICD-10-CM

## 2020-02-09 DIAGNOSIS — Z79899 Other long term (current) drug therapy: Secondary | ICD-10-CM | POA: Diagnosis not present

## 2020-02-09 DIAGNOSIS — M170 Bilateral primary osteoarthritis of knee: Secondary | ICD-10-CM

## 2020-02-09 DIAGNOSIS — M0600 Rheumatoid arthritis without rheumatoid factor, unspecified site: Secondary | ICD-10-CM | POA: Diagnosis not present

## 2020-02-09 DIAGNOSIS — Z8659 Personal history of other mental and behavioral disorders: Secondary | ICD-10-CM

## 2020-02-09 DIAGNOSIS — I447 Left bundle-branch block, unspecified: Secondary | ICD-10-CM

## 2020-02-09 DIAGNOSIS — M19071 Primary osteoarthritis, right ankle and foot: Secondary | ICD-10-CM

## 2020-02-09 DIAGNOSIS — M19042 Primary osteoarthritis, left hand: Secondary | ICD-10-CM

## 2020-02-09 DIAGNOSIS — Z8639 Personal history of other endocrine, nutritional and metabolic disease: Secondary | ICD-10-CM

## 2020-02-09 DIAGNOSIS — I10 Essential (primary) hypertension: Secondary | ICD-10-CM

## 2020-02-09 DIAGNOSIS — Z840 Family history of diseases of the skin and subcutaneous tissue: Secondary | ICD-10-CM

## 2020-02-09 DIAGNOSIS — M8589 Other specified disorders of bone density and structure, multiple sites: Secondary | ICD-10-CM

## 2020-02-09 NOTE — Patient Instructions (Signed)
Standing Labs We placed an order today for your standing lab work.   Please have your standing labs drawn in December and every 3 months   If possible, please have your labs drawn 2 weeks prior to your appointment so that the provider can discuss your results at your appointment.  We have open lab daily Monday through Thursday from 8:30-12:30 PM and 1:30-4:30 PM and Friday from 8:30-12:30 PM and 1:30-4:00 PM at the office of Dr. Lexie Morini, [facility].   Please be advised, patients with office appointments requiring lab work will take precedents over walk-in lab work.  If possible, please come for your lab work on Monday and Friday afternoons, as you may experience shorter wait times. The office is located at [address], [address], [place], [place] [address] No appointment is necessary.   Labs are drawn by [facility]. Please bring your co-pay at the time of your lab draw.  You may receive a bill from [facility] for your lab work.  If you wish to have your labs drawn at another location, please call the office 24 hours in advance to send orders.  If you have any questions regarding directions or hours of operation,  please call [phone].   As a reminder, please drink plenty of water prior to coming for your lab work. Thanks!   COVID-19 vaccine recommendations:   COVID-19 vaccine is recommended for everyone (unless you are allergic to a vaccine component), even if you are on a medication that suppresses your immune system.   If you are on Methotrexate, Cellcept (mycophenolate), [name], Xeljanz, and Olumiant- hold the medication for 1 week after each vaccine. Hold Methotrexate for 2 weeks after the single dose COVID-19 vaccine.   If you are on Orencia subcutaneous injection - hold medication one week prior to and one week after the first COVID-19 vaccine dose (only).   If you are on Orencia IV infusions- time vaccination administration so that the first COVID-19  vaccination will occur four weeks after the infusion and postpone the subsequent infusion by one week.   If you are on Cyclophosphamide or Rituxan infusions please contact your doctor prior to receiving the COVID-19 vaccine.   Do not take Tylenol or any anti-inflammatory medications (NSAIDs) 24 hours prior to the COVID-19 vaccination.   There is no direct evidence about the efficacy of the COVID-19 vaccine in individuals who are on medications that suppress the immune system.   Even if you are fully vaccinated, and you are on any medications that suppress your immune system, please continue to wear a mask, maintain at least six feet social distance and practice hand hygiene.   If you develop a COVID-19 infection, please contact your PCP or our office to determine if you need antibody infusion.  The booster vaccine is now available for immunocompromised patients. It is advised that if you had Pfizer vaccine you should get Pfizer booster.  If you had a Moderna vaccine then you should get a Moderna booster. Johnson and Johnson does not have a booster vaccine at this time.  Please see the following web sites for updated information.   [email or web address]://[email or web address]/[email or web address]-Vaccination-Patient-Resources.[email or web address]  [email or web address]://[email or web address]/About-Us/Newsroom/Press-Releases/ID/1159    

## 2020-02-10 LAB — CBC WITH DIFFERENTIAL/PLATELET
Absolute Monocytes: 536 cells/uL (ref 200–950)
Basophils Absolute: 10 cells/uL (ref 0–200)
Basophils Relative: 0.2 %
Eosinophils Absolute: 78 cells/uL (ref 15–500)
Eosinophils Relative: 1.5 %
HCT: 41.8 % (ref 38.5–50.0)
Hemoglobin: 14 g/dL (ref 13.2–17.1)
Lymphs Abs: 1004 cells/uL (ref 850–3900)
MCH: 32.8 pg (ref 27.0–33.0)
MCHC: 33.5 g/dL (ref 32.0–36.0)
MCV: 97.9 fL (ref 80.0–100.0)
MPV: 10.2 fL (ref 7.5–12.5)
Monocytes Relative: 10.3 %
Neutro Abs: 3572 cells/uL (ref 1500–7800)
Neutrophils Relative %: 68.7 %
Platelets: 229 10*3/uL (ref 140–400)
RBC: 4.27 10*6/uL (ref 4.20–5.80)
RDW: 13.5 % (ref 11.0–15.0)
Total Lymphocyte: 19.3 %
WBC: 5.2 10*3/uL (ref 3.8–10.8)

## 2020-02-10 LAB — COMPLETE METABOLIC PANEL WITH GFR
AG Ratio: 1.7 (calc) (ref 1.0–2.5)
ALT: 17 U/L (ref 9–46)
AST: 24 U/L (ref 10–35)
Albumin: 4.4 g/dL (ref 3.6–5.1)
Alkaline phosphatase (APISO): 60 U/L (ref 35–144)
BUN/Creatinine Ratio: 11 (calc) (ref 6–22)
BUN: 14 mg/dL (ref 7–25)
CO2: 25 mmol/L (ref 20–32)
Calcium: 9.5 mg/dL (ref 8.6–10.3)
Chloride: 106 mmol/L (ref 98–110)
Creat: 1.27 mg/dL — ABNORMAL HIGH (ref 0.70–1.18)
GFR, Est African American: 64 mL/min/{1.73_m2} (ref >=60)
GFR, Est Non African American: 55 mL/min/{1.73_m2} — ABNORMAL LOW (ref >=60)
Globulin: 2.6 g/dL (calc) (ref 1.9–3.7)
Glucose, Bld: 107 mg/dL — ABNORMAL HIGH (ref 65–99)
Potassium: 4.2 mmol/L (ref 3.5–5.3)
Sodium: 140 mmol/L (ref 135–146)
Total Bilirubin: 0.9 mg/dL (ref 0.2–1.2)
Total Protein: 7 g/dL (ref 6.1–8.1)

## 2020-02-10 NOTE — Progress Notes (Signed)
CBC is normal.  Creatinine is low.  He is on diuretics.  We will continue to monitor.

## 2020-02-11 ENCOUNTER — Ambulatory Visit: Payer: Medicare Other | Admitting: Internal Medicine

## 2020-02-22 ENCOUNTER — Other Ambulatory Visit: Payer: Self-pay

## 2020-02-22 ENCOUNTER — Encounter: Payer: Self-pay | Admitting: Internal Medicine

## 2020-02-22 ENCOUNTER — Ambulatory Visit: Payer: Medicare Other | Admitting: Internal Medicine

## 2020-02-22 VITALS — BP 130/78 | HR 62 | Temp 97.8°F | Resp 18 | Ht 67.0 in | Wt 189.1 lb

## 2020-02-22 DIAGNOSIS — E038 Other specified hypothyroidism: Secondary | ICD-10-CM | POA: Diagnosis not present

## 2020-02-22 DIAGNOSIS — E785 Hyperlipidemia, unspecified: Secondary | ICD-10-CM

## 2020-02-22 DIAGNOSIS — I10 Essential (primary) hypertension: Secondary | ICD-10-CM

## 2020-02-22 NOTE — Patient Instructions (Signed)
Check the  blood pressure regularly BP GOAL is between 110/65 and  135/85. If it is consistently higher or lower, let me know      GO TO THE LAB : Get the blood work     GO TO THE FRONT DESK, PLEASE SCHEDULE YOUR APPOINTMENTS Come back for a physical exam in 4 months 

## 2020-02-22 NOTE — Progress Notes (Signed)
Pre visit review using our clinic review tool, if applicable. No additional management support is needed unless otherwise documented below in the visit note. 

## 2020-02-22 NOTE — Progress Notes (Signed)
Subjective:    Patient ID: Eric [name], male    DOB: 1945/11/18, 74 y.o.   MRN: [id number]  DOS:  02/22/2020 Type of visit - description: rov Since the last office visit, things are stable. Saw rheumatology, note reviewed Had blood work: reviewed.   Review of Systems Still reports bendopnea, he however denies chest pain, DOE or lower extremity edema.   Past Medical History:  Diagnosis Date  . Abnormal LFTs   . Anxiety   . Aortic stenosis 04/15/2011  . Arthritis    generalized  . Cataract    sx 2018  . Dizziness 04/21/2014  . Dyspnea   . GERD (gastroesophageal reflux disease)    with certain foods  . Heart murmur   . HNP (herniated nucleus pulposus), lumbar    L5-S1  . Hyperglycemia 10/20/2014  . Hyperlipidemia    on meds  . Hypertension    on meds  . Hypothyroidism    on meds  . Left bundle branch block (LBBB) 12/18/2017  . Lumbar stenosis   . Macular edema    Dr  Sherlynn Stalls   . 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 AORTIC VALVE REPLACEMENT N/A 12/17/2017   Procedure: MINIMALLY INVASIVE AORTIC VALVE REPLACEMENT (AVR) [ using Edwards Intuity Valve Size 22m;  Surgeon: ORexene Alberts MD;  [facility]  Service: Open Heart Surgery;  Laterality: N/A;  MINI THORACOTOMY  . CARDIAC CATHETERIZATION  11/10/00  . CATARACT EXTRACTION W/ INTRAOCULAR LENS IMPLANT     RIGHT EYE  . CHOLECYSTECTOMY  2008  . COLONOSCOPY  2017   TA  . EYE SURGERY Right 2018  . INGUINAL HERNIA REPAIR Bilateral 2008  . LUMBAR LAMINECTOMY/DECOMPRESSION MICRODISCECTOMY N/A 04/23/2018   Procedure: Microlumbar decompression Lumbar five-Sacral one;  Surgeon: BSusa Day MD;  [facility]  Service: Orthopedics;  Laterality: N/A;  . POLYPECTOMY  2017   TA  . RIGHT/LEFT  HEART CATH AND CORONARY ANGIOGRAPHY N/A 10/16/2017   Procedure: RIGHT/LEFT HEART CATH AND CORONARY ANGIOGRAPHY;  Surgeon: CSherren Mocha MD;  [facility];  Service: Cardiovascular;  Laterality: N/A;  . TEE WITHOUT CARDIOVERSION N/A 12/17/2017   Procedure: TRANSESOPHAGEAL ECHOCARDIOGRAM (TEE);  Surgeon: ORexene Alberts MD;  [facility]  Service: Open Heart Surgery;  Laterality: N/A;  . TONSILLECTOMY  1960    Allergies as of 02/22/2020      Reactions   Atorvastatin Other (See Comments)   REACTION: myalgia   Ezetimibe Other (See Comments)   REACTION: myalgia      Medication List       Accurate as of February 22, 2020 11:59 PM. If you have any questions, ask your nurse or doctor.        acetaminophen 325 MG tablet Commonly known as: TYLENOL Take 2 tablets (650 mg total) by mouth every 6 (six) hours as needed for mild pain, moderate pain, fever or headache.   amLODipine 5 MG tablet Commonly known as: NORVASC TAKE 1 TABLET BY MOUTH EVERY DAY   aspirin EC 81 MG tablet Take 81 mg by mouth daily.   carvedilol 12.5 MG tablet Commonly known as: COREG TAKE 1 TABLET (12.5 MG TOTAL) BY MOUTH 2 (TWO) TIMES DAILY WITH A MEAL.   folic acid 1 MG tablet Commonly known as: FOLVITE Take 1  tablet (1 mg total) by mouth daily.   levothyroxine 88 MCG tablet Commonly known as: SYNTHROID Take 1 tablet (88 mcg total) by mouth daily before breakfast.   methotrexate 2.5 MG tablet Commonly known as: RHEUMATREX Take 4 tablets (10 mg total) by mouth once a week.   Plenvu 140 g Solr Generic drug: PEG-KCl-NaCl-NaSulf-Na Asc-C Take 1 kit by mouth as directed.   rosuvastatin 5 MG tablet Commonly known as: CRESTOR Take 1 tablet (5 mg total) by mouth daily.   spironolactone 25 MG tablet Commonly known as: ALDACTONE TAKE 1 TABLET (25 MG TOTAL) BY MOUTH DAILY. PLEASE CALL TO SCHEDULE APPOINTMENT FOR FURTHER REFILLS   tobramycin 0.3 % ophthalmic solution Commonly known as:  TOBREX PLEASE SEE ATTACHED FOR DETAILED DIRECTIONS   VITAMIN D-3 PO Take 2,000 Units by mouth daily.          Objective:   Physical Exam BP 130/78 (BP Location: Left Arm, Patient Position: Sitting, Cuff Size: Normal)   Pulse 62   Temp 97.8 F (36.6 C) (Oral)   Resp 18   Ht 5' 7"  (1.702 m)   Wt 189 lb 2 oz (85.8 kg)   SpO2 97%   BMI 29.62 kg/m  General:   Well developed, NAD, BMI noted. HEENT:  Normocephalic . Face symmetric, atraumatic Lungs:  CTA B Normal respiratory effort, no intercostal retractions, no accessory muscle use. Heart: RRR,  no murmur.  Lower extremities: no pretibial edema bilaterally  Skin: Not pale. Not jaundice Neurologic:  alert & oriented X3.  Speech normal, gait appropriate for age and unassisted Psych--  Cognition and judgment appear intact.  Cooperative with normal attention span and concentration.  Behavior appropriate. No anxious or depressed appearing.      Assessment      Assessment HTN Hyperlipidemia Hypothyroidism R.A. dx 2020 DJD Increase LFTs Ultrasound 2002 done for increased LFTs: Normal liver; Hep C negative 02-16-15 Aortic stenosis , dx  2012 , s/p AoVR 12/2017 Macular degeneration, cataracts  +FH CAD  --Pt's Brother has CAD dx age 71s --Patient himself has a cardiac catheterization with minimal to no disease 10/2017  PLAN HTN: Seems well controlled, continue Amlodipine, carvedilol, Aldactone.  Last BMP okay, creatinine 1.2.  Recheck on RTC Hyperlipidemia: On Crestor, check FLP (had applesauce this morning) Hypothyroidism: On Synthroid, check TSH. Rheumatoid arthritis: Last visit with Dr. Dora Sims 02/09/2020, felt to be stable on MTX.  DJD is playing a part on his MSK issues. Cardiovascular: Reports bendopnea but no other sxs, to see cardiology next month Preventive care: Has a flu shot scheduled for next month RTC CPX 4 months   This visit occurred during the SARS-CoV-2 public health emergency.  Safety protocols were  in place, including screening questions prior to the visit, additional usage of staff PPE, and extensive cleaning of exam room while observing appropriate contact time as indicated for disinfecting solutions.

## 2020-02-23 ENCOUNTER — Telehealth: Payer: Self-pay | Admitting: Gastroenterology

## 2020-02-23 LAB — LIPID PANEL
Cholesterol: 184 mg/dL (ref <200)
HDL: 47 mg/dL (ref >=40)
LDL Cholesterol (Calc): 110 mg/dL (calc) — ABNORMAL HIGH
Non-HDL Cholesterol (Calc): 137 mg/dL (calc) — ABNORMAL HIGH (ref <130)
Total CHOL/HDL Ratio: 3.9 (calc) (ref <5.0)
Triglycerides: 157 mg/dL — ABNORMAL HIGH (ref <150)

## 2020-02-23 LAB — TSH: TSH: 1.63 mIU/L (ref 0.40–4.50)

## 2020-02-23 NOTE — Assessment & Plan Note (Signed)
HTN: Seems well controlled, continue Amlodipine, carvedilol, Aldactone.  Last BMP okay, creatinine 1.2.  Recheck on RTC Hyperlipidemia: On Crestor, check FLP (had applesauce this morning) Hypothyroidism: On Synthroid, check TSH. Rheumatoid arthritis: Last visit with Dr. Dora Sims 02/09/2020, felt to be stable on MTX.  DJD is playing a part on his MSK issues. Cardiovascular: Reports bendopnea but no other sxs, to see cardiology next month Preventive care: Has a flu shot scheduled for next month RTC CPX 4 months

## 2020-02-24 NOTE — Telephone Encounter (Signed)
I spoke with Eric [name], he thinks he was given Valium, however upon reviewing the chart, this wasn't before the colonoscopy but before his heart cath. He is requesting this to help with the anxiety he gets before a procedure. Please advise.

## 2020-02-24 NOTE — Telephone Encounter (Signed)
Eric [name] has been notified and he understands the options of talking to his PCP or anesthesia upon arrival.

## 2020-02-24 NOTE — Telephone Encounter (Signed)
I am not in the practice of prescribing anxiety medicine before endoscopic procedures.  After his intake and IV placement in the endoscopy lab that day, anesthesia will assess him and anxiety medicine may be given if necessary.  - HD

## 2020-02-25 ENCOUNTER — Other Ambulatory Visit: Payer: Self-pay | Admitting: Rheumatology

## 2020-02-25 NOTE — Telephone Encounter (Signed)
Last Visit: 02/09/2020 Next Visit: 07/12/2020 Labs: 02/09/2020 CBC is normal. Creatinine is low. He is on diuretics. We will continue to monitor.  Current Dose per office note on 02/09/2020: MTX 4 tablets po once weekly DX: Seronegative rheumatoid arthritis  Okay to refill methotrexate?

## 2020-02-29 ENCOUNTER — Ambulatory Visit (AMBULATORY_SURGERY_CENTER): Payer: Medicare Other | Admitting: Gastroenterology

## 2020-02-29 ENCOUNTER — Encounter: Payer: Self-pay | Admitting: Gastroenterology

## 2020-02-29 ENCOUNTER — Other Ambulatory Visit: Payer: Self-pay

## 2020-02-29 VITALS — BP 105/61 | HR 57 | Temp 96.9°F | Resp 28 | Ht 67.0 in | Wt 190.0 lb

## 2020-02-29 DIAGNOSIS — D123 Benign neoplasm of transverse colon: Secondary | ICD-10-CM

## 2020-02-29 DIAGNOSIS — Z86010 Personal history of colonic polyps: Secondary | ICD-10-CM

## 2020-02-29 MED ORDER — SODIUM CHLORIDE 0.9 % IV SOLN: 500.0000 mL | Freq: Once | INTRAVENOUS | Status: DC

## 2020-02-29 NOTE — Progress Notes (Signed)
Called to room to assist during endoscopic procedure.  Patient ID and intended procedure confirmed with present staff. Received instructions for my participation in the procedure from the performing physician.  

## 2020-02-29 NOTE — Progress Notes (Signed)
Pt's states no medical or surgical changes since previsit or office visit.  VS by SH. 

## 2020-02-29 NOTE — Op Note (Addendum)
[name] Patient Name: Eric [name] Procedure Date: 02/29/2020 9:31 AM MRN: [id number] Endoscopist: Irvona. Loletha Carrow , MD Age: 74 Referring MD:  Date of Birth: 11-28-1945 Gender: Male Account #: [id number] Procedure:                Colonoscopy Indications:              Surveillance: Personal history of adenomatous                            polyps on last colonoscopy > 3 years ago (TA x 4 <                            82mm ;05/2016) Medicines:                Monitored Anesthesia Care Procedure:                Pre-Anesthesia Assessment:                           - Prior to the procedure, a History and Physical                            was performed, and patient medications and                            allergies were reviewed. The patient's tolerance of                            previous anesthesia was also reviewed. The risks                            and benefits of the procedure and the sedation                            options and risks were discussed with the patient.                            All questions were answered, and informed consent                            was obtained. Prior Anticoagulants: The patient has                            taken no previous anticoagulant or antiplatelet                            agents except for aspirin. ASA Grade Assessment:                            III - A patient with severe systemic disease. After                            reviewing the risks and benefits, the patient was  deemed in satisfactory condition to undergo the                            procedure.                           After obtaining informed consent, the colonoscope                            was passed under direct vision. Throughout the                            procedure, the patient's blood pressure, pulse, and                            oxygen saturations were monitored continuously. The                             Colonoscope was introduced through the anus and                            advanced to the the cecum, identified by                            appendiceal orifice and ileocecal valve. The                            colonoscopy was performed without difficulty. The                            patient tolerated the procedure well. The quality                            of the bowel preparation was excellent. The                            ileocecal valve, appendiceal orifice, and rectum                            were photographed. The bowel preparation used was                            Plenvu. Scope In: 9:44:17 AM Scope Out: 9:58:44 AM Scope Withdrawal Time: 0 hours 10 minutes 11 seconds  Total Procedure Duration: 0 hours 14 minutes 27 seconds  Findings:                 The perianal and digital rectal examinations were                            normal.                           Two sessile polyps were found in the transverse  colon. The polyps were diminutive in size. These                            polyps were removed with a cold snare. Resection                            and retrieval were complete.                           A few diverticula were found in the left colon and                            right colon.                           Internal hemorrhoids were found.                           The exam was otherwise without abnormality on                            direct and retroflexion views. Complications:            No immediate complications. Estimated Blood Loss:     Estimated blood loss was minimal. Impression:               - Two diminutive polyps in the transverse colon,                            removed with a cold snare. Resected and retrieved.                           - Diverticulosis in the left colon and in the right                            colon.                           - Internal hemorrhoids.                           - The  examination was otherwise normal on direct                            and retroflexion views. Recommendation:           - Patient has a contact number available for                            emergencies. The signs and symptoms of potential                            delayed complications were discussed with the                            patient. Return to normal activities tomorrow.  Written discharge instructions were provided to the                            patient.                           - Resume previous diet.                           - Continue present medications.                           - Await pathology results.                           - Based on current guidelines, no repeat                            surveillance colonoscopy recommended. [name], MD 02/29/2020 10:08:19 AM This report has been signed electronically.

## 2020-02-29 NOTE — Progress Notes (Signed)
pt tolerated well. VSS. awake and to recovery. Report given to RN.  

## 2020-02-29 NOTE — Patient Instructions (Signed)
Handouts given for polyps, diverticulosis and hemorrhoids.  Await pathology results.  YOU HAD AN ENDOSCOPIC PROCEDURE TODAY AT THE [facility]:   Refer to the procedure report that was given to you for any specific questions about what was found during the examination.  If the procedure report does not answer your questions, please call your gastroenterologist to clarify.  If you requested that your care partner not be given the details of your procedure findings, then the procedure report has been included in a sealed envelope for you to review at your convenience later.  YOU SHOULD EXPECT: Some feelings of bloating in the abdomen. Passage of more gas than usual.  Walking can help get rid of the air that was put into your GI tract during the procedure and reduce the bloating. If you had a lower endoscopy (such as a colonoscopy or flexible sigmoidoscopy) you may notice spotting of blood in your stool or on the toilet paper. If you underwent a bowel prep for your procedure, you may not have a normal bowel movement for a few days.  Please Note:  You might notice some irritation and congestion in your nose or some drainage.  This is from the oxygen used during your procedure.  There is no need for concern and it should clear up in a day or so.  SYMPTOMS TO REPORT IMMEDIATELY:   Following lower endoscopy (colonoscopy or flexible sigmoidoscopy):  Excessive amounts of blood in the stool  Significant tenderness or worsening of abdominal pains  Swelling of the abdomen that is new, acute  Fever of 100F or higher   For urgent or emergent issues, a gastroenterologist can be reached at any hour by calling [phone]. Do not use MyChart messaging for urgent concerns.    DIET:  We do recommend a small meal at first, but then you may proceed to your regular diet.  Drink plenty of fluids but you should avoid alcoholic beverages for 24 hours.  ACTIVITY:  You should plan to take it easy for  the rest of today and you should NOT DRIVE or use heavy machinery until tomorrow (because of the sedation medicines used during the test).    FOLLOW UP: Our staff will call the number listed on your records 48-72 hours following your procedure to check on you and address any questions or concerns that you may have regarding the information given to you following your procedure. If we do not reach you, we will leave a message.  We will attempt to reach you two times.  During this call, we will ask if you have developed any symptoms of COVID 19. If you develop any symptoms (ie: fever, flu-like symptoms, shortness of breath, cough etc.) before then, please call [phone].  If you test positive for Covid 19 in the 2 weeks post procedure, please call and report this information to us.    If any biopsies were taken you will be contacted by phone or by letter within the next 1-3 weeks.  Please call us at [phone] if you have not heard about the biopsies in 3 weeks.    SIGNATURES/CONFIDENTIALITY: You and/or your care partner have signed paperwork which will be entered into your electronic medical record.  These signatures attest to the fact that that the information above on your After Visit Summary has been reviewed and is understood.  Full responsibility of the confidentiality of this discharge information lies with you and/or your care-partner. 

## 2020-03-02 ENCOUNTER — Telehealth: Payer: Self-pay

## 2020-03-02 NOTE — Telephone Encounter (Signed)
°  Follow up Call-  Call back number 02/29/2020  Post procedure Call Back phone  # [phone]  Permission to leave phone message Yes  Some recent data might be hidden     Patient questions:  Do you have a fever, pain , or abdominal swelling? No. Pain Score  0 *  Have you tolerated food without any problems? Yes.    Have you been able to return to your normal activities? Yes.    Do you have any questions about your discharge instructions: Diet   No. Medications  No. Follow up visit  No.  Do you have questions or concerns about your Care? No.  Actions: * If pain score is 4 or above: No action needed, pain <4. 1. Have you developed a fever since your procedure? no  2.   Have you had an respiratory symptoms (SOB or cough) since your procedure? no  3.   Have you tested positive for COVID 19 since your procedure no  4.   Have you had any family members/close contacts diagnosed with the COVID 19 since your procedure?  no   If yes to any of these questions please route to Joylene John, RN and Joella Prince, RN

## 2020-03-03 ENCOUNTER — Other Ambulatory Visit: Payer: Self-pay | Admitting: Internal Medicine

## 2020-03-07 ENCOUNTER — Encounter: Payer: Self-pay | Admitting: Gastroenterology

## 2020-03-12 ENCOUNTER — Other Ambulatory Visit: Payer: Self-pay | Admitting: Cardiology

## 2020-03-14 ENCOUNTER — Ambulatory Visit (INDEPENDENT_AMBULATORY_CARE_PROVIDER_SITE_OTHER): Payer: Medicare Other

## 2020-03-14 ENCOUNTER — Other Ambulatory Visit: Payer: Self-pay

## 2020-03-14 DIAGNOSIS — Z23 Encounter for immunization: Secondary | ICD-10-CM | POA: Diagnosis not present

## 2020-03-28 DIAGNOSIS — R011 Cardiac murmur, unspecified: Secondary | ICD-10-CM | POA: Insufficient documentation

## 2020-03-28 DIAGNOSIS — F419 Anxiety disorder, unspecified: Secondary | ICD-10-CM | POA: Insufficient documentation

## 2020-03-28 DIAGNOSIS — M48061 Spinal stenosis, lumbar region without neurogenic claudication: Secondary | ICD-10-CM | POA: Insufficient documentation

## 2020-03-28 DIAGNOSIS — R0600 Dyspnea, unspecified: Secondary | ICD-10-CM | POA: Insufficient documentation

## 2020-03-28 DIAGNOSIS — H269 Unspecified cataract: Secondary | ICD-10-CM | POA: Insufficient documentation

## 2020-03-28 DIAGNOSIS — M1990 Unspecified osteoarthritis, unspecified site: Secondary | ICD-10-CM | POA: Insufficient documentation

## 2020-03-28 DIAGNOSIS — K219 Gastro-esophageal reflux disease without esophagitis: Secondary | ICD-10-CM | POA: Insufficient documentation

## 2020-03-29 ENCOUNTER — Ambulatory Visit: Payer: Medicare Other | Admitting: Cardiology

## 2020-03-29 ENCOUNTER — Other Ambulatory Visit: Payer: Self-pay

## 2020-03-29 ENCOUNTER — Encounter: Payer: Self-pay | Admitting: Cardiology

## 2020-03-29 VITALS — BP 144/78 | HR 68 | Ht 67.0 in | Wt 190.0 lb

## 2020-03-29 DIAGNOSIS — I10 Essential (primary) hypertension: Secondary | ICD-10-CM

## 2020-03-29 DIAGNOSIS — I447 Left bundle-branch block, unspecified: Secondary | ICD-10-CM | POA: Diagnosis not present

## 2020-03-29 DIAGNOSIS — Z953 Presence of xenogenic heart valve: Secondary | ICD-10-CM

## 2020-03-29 DIAGNOSIS — E785 Hyperlipidemia, unspecified: Secondary | ICD-10-CM

## 2020-03-29 NOTE — Progress Notes (Signed)
Cardiology Office Note:    Date:  03/29/2020   ID:  Eric [name], DOB 29-Oct-1945, MRN [id number]  PCP:  Colon Branch, MD  Cardiologist:  Shirlee More, MD    Referring MD: Colon Branch, MD    ASSESSMENT:    1. 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 Essential hypertension   3. Hyperlipidemia, unspecified hyperlipidemia type   4. Left bundle branch block (LBBB)    PLAN:    In order of problems listed above:  1. Has had difficult postoperative course with fracture spine surgery COVID-19 pneumonia fall and intracranial hemorrhage and is completely and fully recovered.  No evidence of valve dysfunction takes low-dose aspirin and will need a follow-up echocardiogram here 5 postoperative 2024 2. Stable BP at target I told him I do not want to see systolics less than 992 in his case.  Continue current treatment including amlodipine MRA and carvedilol. 3. SVT stable no recurrence 4. Continue his statin high intensity 5. Stable EKG pattern   Next appointment: 1 yr   Medication Adjustments/Labs and Tests Ordered: Current medicines are reviewed at length with the patient today.  Concerns regarding medicines are outlined above.  No orders of the defined types were placed in this encounter.  No orders of the defined types were placed in this encounter.   Chief Complaint  Patient presents with  . Follow-up    after AVR    History of Present Illness:    Eric [name] is a 74 y.o. male with a hx of aortic stenosis status post surgical bioprosthetic AVR hypertension hyperlipidemia and hypothyroidism.  He was last seen 03/29/2019.  He was having episodes of orthostatic lightheadedness related to his antihypertensive agents.  He was admitted to [facility] in November with a fall and subarachnoid hemorrhage and had COVID-19 infection.  Evaluation included echocardiogram showing normal valve function ejection fraction 60 to 65% and he had mild  carotid atherosclerosis but no stenotic lesion  He was last seen 0414/2021. Compliance with diet, lifestyle and medications: Yes  Is planning for recovered and his only significant complaint is joint pain from rheumatoid arthritis improving with methotrexate.  He has had Covid immunization.  No chest pain shortness of breath palpitation or syncope.  Home blood pressure runs 10/04/8339 systolic he is on lipid-lowering therapy with rosuvastatin no fever or chills. Past Medical History:  Diagnosis Date  . Abnormal LFTs   . Annual physical exam 04/15/2011  . Anxiety   . Aortic stenosis 04/15/2011  . Arthritis    generalized  . Cataract    sx 2018  . Dizziness 04/21/2014  . Dyspnea   . GERD (gastroesophageal reflux disease)    with certain foods  . Heart murmur   . HNP (herniated nucleus pulposus), lumbar    L5-S1  . Hyperglycemia 10/20/2014  . Hyperlipidemia    on meds  . Hypertension    on meds  . Hypothyroidism    on meds  . Left bundle branch block (LBBB) 12/18/2017  . Lumbar stenosis   . Macular edema    Dr  Sherlynn Stalls   . PCP NOTES >>>>>> 04/27/2015  . Prolapsed lumbar disc 03/03/2018  . Rheumatoid arthritis ([place]) 11/17/2018  . 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 Spinal stenosis of lumbar region 04/23/2018  . Subarachnoid hemorrhage ([place]) 04/28/2019  .  Wide-complex tachycardia ([place]) 12/30/2017    Past Surgical History:  Procedure Laterality Date  . AORTIC VALVE REPLACEMENT N/A 12/17/2017   Procedure: MINIMALLY INVASIVE AORTIC VALVE REPLACEMENT (AVR) [ using Edwards Intuity Valve Size 57mm;  Surgeon: Rexene Alberts, MD;  Location: [place];  Service: Open Heart Surgery;  Laterality: N/A;  MINI THORACOTOMY  . CARDIAC CATHETERIZATION  11/10/00  . CATARACT EXTRACTION W/ INTRAOCULAR LENS IMPLANT     RIGHT EYE  .  CHOLECYSTECTOMY  2008  . COLONOSCOPY  2017   TA  . EYE SURGERY Right 2018  . INGUINAL HERNIA REPAIR Bilateral 2008  . LUMBAR LAMINECTOMY/DECOMPRESSION MICRODISCECTOMY N/A 04/23/2018   Procedure: Microlumbar decompression Lumbar five-Sacral one;  Surgeon: Susa Day, MD;  Location: [place];  Service: Orthopedics;  Laterality: N/A;  . POLYPECTOMY  2017   TA  . RIGHT/LEFT HEART CATH AND CORONARY ANGIOGRAPHY N/A 10/16/2017   Procedure: RIGHT/LEFT HEART CATH AND CORONARY ANGIOGRAPHY;  Surgeon: [name], MD;  Location: [place] CV [facility];  Service: Cardiovascular;  Laterality: N/A;  . TEE WITHOUT CARDIOVERSION N/A 12/17/2017   Procedure: TRANSESOPHAGEAL ECHOCARDIOGRAM (TEE);  Surgeon: Rexene Alberts, MD;  Location: [place];  Service: Open Heart Surgery;  Laterality: N/A;  . TONSILLECTOMY  1960    Current Medications: Current Meds  Medication Sig  . acetaminophen (TYLENOL) 325 MG tablet Take 2 tablets (650 mg total) by mouth every 6 (six) hours as needed for mild pain, moderate pain, fever or headache.  Marland Kitchen amLODipine (NORVASC) 5 MG tablet TAKE 1 TABLET BY MOUTH EVERY DAY  . aspirin EC 81 MG tablet Take 81 mg by mouth daily.  . carvedilol (COREG) 12.5 MG tablet TAKE 1 TABLET (12.5 MG TOTAL) BY MOUTH 2 (TWO) TIMES DAILY WITH A MEAL.  Marland Kitchen Cholecalciferol (VITAMIN D-3 PO) Take 2,000 Units by mouth daily.  . folic acid (FOLVITE) 1 MG tablet Take 1 tablet (1 mg total) by mouth daily.  Marland Kitchen levothyroxine (SYNTHROID) 88 MCG tablet Take 1 tablet (88 mcg total) by mouth daily before breakfast.  . methotrexate (RHEUMATREX) 2.5 MG tablet TAKE 4 TABLETS (10 MG TOTAL) BY MOUTH ONCE A WEEK.  . rosuvastatin (CRESTOR) 5 MG tablet Take 1 tablet (5 mg total) by mouth daily.  Marland Kitchen spironolactone (ALDACTONE) 25 MG tablet TAKE 1 TABLET (25 MG TOTAL) BY MOUTH DAILY. PLEASE CALL TO SCHEDULE APPOINTMENT FOR FURTHER REFILLS     Allergies:   Atorvastatin and Ezetimibe   Social History   Socioeconomic History  .  Marital status: Married    Spouse name: Not on file  . Number of children: 3  . Years of education: Not on file  . Highest education level: Not on file  Occupational History  . Occupation: retired- used to work for [employer]: 05/2008  Tobacco Use  . Smoking status: Former Smoker    Packs/day: 2.00    Years: 35.00    Pack years: 70.00    Quit date: 04/15/1990    Years since quitting: 29.9  . Smokeless tobacco: Never Used  . Tobacco comment: used to smoke 2 ppd  Vaping Use  . Vaping Use: Never used  Substance and Sexual Activity  . Alcohol use: Not Currently    Comment: no ETOH since the 90s  . Drug use: No  . Sexual activity: Not on file  Other Topics Concern  . Not on file  Social History Narrative   Lives w/ wife   3 children, 8 g-kids  Social Determinants of Health   Financial Resource Strain:   . Difficulty of Paying Living Expenses: Not on file  Food Insecurity:   . Worried About Charity fundraiser in the Last Year: Not on file  . Ran Out of Food in the Last Year: Not on file  Transportation Needs:   . Lack of Transportation (Medical): Not on file  . Lack of Transportation (Non-Medical): Not on file  Physical Activity:   . Days of Exercise per Week: Not on file  . Minutes of Exercise per Session: Not on file  Stress:   . Feeling of Stress : Not on file  Social Connections:   . Frequency of Communication with Friends and Family: Not on file  . Frequency of Social Gatherings with Friends and Family: Not on file  . Attends Religious Services: Not on file  . Active Member of Clubs or Organizations: Not on file  . Attends Archivist Meetings: Not on file  . Marital Status: Not on file     Family History: The patient's family history includes COPD in his father; Coronary artery disease in his brother; Diabetes in his brother, daughter, and mother; Emphysema in his father; Stroke in his mother. There is no history of Colon cancer,  Prostate cancer, Colon polyps, Esophageal cancer, Rectal cancer, or Stomach cancer. ROS:   Please see the history of present illness.    All other systems reviewed and are negative.  EKGs/Labs/Other Studies Reviewed:    The following studies were reviewed today:  EKG:  EKG ordered today and personally reviewed.  The ekg ordered today demonstrates sinus rhythm left bundle branch block unchanged from previous  Recent Labs: 05/03/2019: Magnesium 2.1 02/09/2020: ALT 17; BUN 14; Creat 1.27; Hemoglobin 14.0; Platelets 229; Potassium 4.2; Sodium 140 02/22/2020: TSH 1.63  Recent Lipid Panel    Component Value Date/Time   CHOL 184 02/22/2020 1110   CHOL 172 02/24/2018 1150   TRIG 157 (H) 02/22/2020 1110   TRIG 115 05/05/2006 0913   HDL 47 02/22/2020 1110   HDL 61 02/24/2018 1150   CHOLHDL 3.9 02/22/2020 1110   VLDL 39.2 03/11/2019 1020   LDLCALC 110 (H) 02/22/2020 1110   LDLDIRECT 76.5 04/07/2008 1057    Physical Exam:    VS:  BP (!) 144/78   Pulse 68   Ht 5\' 7"  (1.702 m)   Wt 190 lb (86.2 kg)   SpO2 97%   BMI 29.76 kg/m     Wt Readings from Last 3 Encounters:  03/29/20 190 lb (86.2 kg)  02/29/20 190 lb (86.2 kg)  02/22/20 189 lb 2 oz (85.8 kg)     GEN:  Well nourished, well developed in no acute distress HEENT: Normal NECK: No JVD; No carotid bruits LYMPHATICS: No lymphadenopathy CARDIAC: RRR, no murmurs, rubs, gallops RESPIRATORY:  Clear to auscultation without rales, wheezing or rhonchi  ABDOMEN: Soft, non-tender, non-distended MUSCULOSKELETAL:  No edema; No deformity  SKIN: Warm and dry NEUROLOGIC:  Alert and oriented x 3 PSYCHIATRIC:  Normal affect    Signed, Shirlee More, MD  03/29/2020 1:09 PM    [facility]

## 2020-03-29 NOTE — Patient Instructions (Signed)

## 2020-05-10 ENCOUNTER — Other Ambulatory Visit: Payer: Self-pay | Admitting: *Deleted

## 2020-05-10 DIAGNOSIS — Z79899 Other long term (current) drug therapy: Secondary | ICD-10-CM

## 2020-05-11 LAB — COMPLETE METABOLIC PANEL WITH GFR
AG Ratio: 1.8 (calc) (ref 1.0–2.5)
ALT: 19 U/L (ref 9–46)
AST: 24 U/L (ref 10–35)
Albumin: 4.5 g/dL (ref 3.6–5.1)
Alkaline phosphatase (APISO): 60 U/L (ref 35–144)
BUN: 13 mg/dL (ref 7–25)
CO2: 28 mmol/L (ref 20–32)
Calcium: 9.5 mg/dL (ref 8.6–10.3)
Chloride: 105 mmol/L (ref 98–110)
Creat: 1.12 mg/dL (ref 0.70–1.18)
GFR, Est African American: 75 mL/min/{1.73_m2} (ref >=60)
GFR, Est Non African American: 64 mL/min/{1.73_m2} (ref >=60)
Globulin: 2.5 g/dL (calc) (ref 1.9–3.7)
Glucose, Bld: 102 mg/dL — ABNORMAL HIGH (ref 65–99)
Potassium: 4.3 mmol/L (ref 3.5–5.3)
Sodium: 140 mmol/L (ref 135–146)
Total Bilirubin: 0.8 mg/dL (ref 0.2–1.2)
Total Protein: 7 g/dL (ref 6.1–8.1)

## 2020-05-11 LAB — CBC WITH DIFFERENTIAL/PLATELET
Absolute Monocytes: 517 cells/uL (ref 200–950)
Basophils Absolute: 19 cells/uL (ref 0–200)
Basophils Relative: 0.4 %
Eosinophils Absolute: 71 cells/uL (ref 15–500)
Eosinophils Relative: 1.5 %
HCT: 41.5 % (ref 38.5–50.0)
Hemoglobin: 13.9 g/dL (ref 13.2–17.1)
Lymphs Abs: 1067 cells/uL (ref 850–3900)
MCH: 32.6 pg (ref 27.0–33.0)
MCHC: 33.5 g/dL (ref 32.0–36.0)
MCV: 97.2 fL (ref 80.0–100.0)
MPV: 10.1 fL (ref 7.5–12.5)
Monocytes Relative: 11 %
Neutro Abs: 3027 cells/uL (ref 1500–7800)
Neutrophils Relative %: 64.4 %
Platelets: 223 10*3/uL (ref 140–400)
RBC: 4.27 10*6/uL (ref 4.20–5.80)
RDW: 12.8 % (ref 11.0–15.0)
Total Lymphocyte: 22.7 %
WBC: 4.7 10*3/uL (ref 3.8–10.8)

## 2020-05-11 NOTE — Progress Notes (Signed)
CBC and CMP are normal.

## 2020-05-12 ENCOUNTER — Other Ambulatory Visit: Payer: Self-pay | Admitting: Physician Assistant

## 2020-05-12 NOTE — Telephone Encounter (Signed)
Last Visit: 02/09/2020 Next Visit: 07/12/2020 Labs: 05/10/2020 CBC and CMP are normal.  Current Dose per office note on 02/09/2020: MTX 4 tablets po once weekly DX: Seronegative rheumatoid arthritis  Okay to refill per Dr. Estanislado Pandy

## 2020-05-23 ENCOUNTER — Other Ambulatory Visit: Payer: Self-pay | Admitting: Internal Medicine

## 2020-06-22 ENCOUNTER — Other Ambulatory Visit: Payer: Self-pay

## 2020-06-23 ENCOUNTER — Encounter: Payer: Self-pay | Admitting: Internal Medicine

## 2020-06-23 ENCOUNTER — Ambulatory Visit (INDEPENDENT_AMBULATORY_CARE_PROVIDER_SITE_OTHER): Payer: Medicare Other | Admitting: Internal Medicine

## 2020-06-23 VITALS — BP 121/69 | HR 64 | Temp 97.9°F | Resp 16 | Ht 67.0 in | Wt 191.0 lb

## 2020-06-23 DIAGNOSIS — R6889 Other general symptoms and signs: Secondary | ICD-10-CM | POA: Diagnosis not present

## 2020-06-23 DIAGNOSIS — Z0000 Encounter for general adult medical examination without abnormal findings: Secondary | ICD-10-CM | POA: Diagnosis not present

## 2020-06-23 DIAGNOSIS — E785 Hyperlipidemia, unspecified: Secondary | ICD-10-CM

## 2020-06-23 DIAGNOSIS — Z0001 Encounter for general adult medical examination with abnormal findings: Secondary | ICD-10-CM

## 2020-06-23 DIAGNOSIS — N402 Nodular prostate without lower urinary tract symptoms: Secondary | ICD-10-CM

## 2020-06-23 DIAGNOSIS — E038 Other specified hypothyroidism: Secondary | ICD-10-CM

## 2020-06-23 LAB — PSA: PSA: 3.76 ng/mL (ref 0.10–4.00)

## 2020-06-23 LAB — LIPID PANEL
Cholesterol: 182 mg/dL (ref 0–200)
HDL: 46.2 mg/dL (ref >39.00)
LDL Cholesterol: 99 mg/dL (ref 0–99)
NonHDL: 135.36
Total CHOL/HDL Ratio: 4
Triglycerides: 181 mg/dL — ABNORMAL HIGH (ref 0.0–149.0)
VLDL: 36.2 mg/dL (ref 0.0–40.0)

## 2020-06-23 LAB — CBC WITH DIFFERENTIAL/PLATELET
Basophils Absolute: 0 10*3/uL (ref 0.0–0.1)
Basophils Relative: 0.2 % (ref 0.0–3.0)
Eosinophils Absolute: 0.1 10*3/uL (ref 0.0–0.7)
Eosinophils Relative: 1.2 % (ref 0.0–5.0)
HCT: 41 % (ref 39.0–52.0)
Hemoglobin: 13.9 g/dL (ref 13.0–17.0)
Lymphocytes Relative: 20.6 % (ref 12.0–46.0)
Lymphs Abs: 1.1 10*3/uL (ref 0.7–4.0)
MCHC: 34 g/dL (ref 30.0–36.0)
MCV: 96.8 fl (ref 78.0–100.0)
Monocytes Absolute: 0.6 10*3/uL (ref 0.1–1.0)
Monocytes Relative: 11.7 % (ref 3.0–12.0)
Neutro Abs: 3.5 10*3/uL (ref 1.4–7.7)
Neutrophils Relative %: 66.3 % (ref 43.0–77.0)
Platelets: 227 10*3/uL (ref 150.0–400.0)
RBC: 4.24 Mil/uL (ref 4.22–5.81)
RDW: 13.9 % (ref 11.5–15.5)
WBC: 5.2 10*3/uL (ref 4.0–10.5)

## 2020-06-23 LAB — TSH: TSH: 1.93 u[IU]/mL (ref 0.35–4.50)

## 2020-06-23 NOTE — Progress Notes (Signed)
Pre visit review using our clinic review tool, if applicable. No additional management support is needed unless otherwise documented below in the visit note. 

## 2020-06-23 NOTE — Patient Instructions (Signed)
Check the  blood pressure regularly  BP GOAL is between 110/65 and  135/85. If it is consistently higher or lower, let me know   We are referring you to the urologist    [facility] : Get the blood work     [place], Rayne back for a checkup in 6 months

## 2020-06-23 NOTE — Progress Notes (Addendum)
Subjective:    Patient ID: Eric [name], male    DOB: [birth year], 75 y.o.   MRN: [id number]  DOS:  06/23/2020 Type of visit - description: CPX In general feels well. He did report cold intolerance but denies fever chills, headaches, chest pain, difficulty breathing.   Review of Systems  Other than above, a 14 point review of systems is negative      Past Medical History:  Diagnosis Date  . Abnormal LFTs   . Annual physical exam 04/15/2011  . Anxiety   . Aortic stenosis 04/15/2011  . Arthritis    generalized  . Cataract    sx 2018  . Dizziness 04/21/2014  . Dyspnea   . GERD (gastroesophageal reflux disease)    with certain foods  . Heart murmur   . HNP (herniated nucleus pulposus), lumbar    L5-S1  . Hyperglycemia 10/20/2014  . Hyperlipidemia    on meds  . Hypertension    on meds  . Hypothyroidism    on meds  . Left bundle branch block (LBBB) 12/18/2017  . Lumbar stenosis   . Macular edema    Dr  Sherlynn Stalls   . PCP NOTES >>>>>> 04/27/2015  . Prolapsed lumbar disc 03/03/2018  . Rheumatoid arthritis ([place]) 11/17/2018  . 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 Spinal stenosis of lumbar region 04/23/2018  . Subarachnoid hemorrhage ([place]) 04/28/2019  . Wide-complex tachycardia ([place]) 12/30/2017    Past Surgical History:  Procedure Laterality Date  . AORTIC VALVE REPLACEMENT N/A 12/17/2017   Procedure: MINIMALLY INVASIVE AORTIC VALVE REPLACEMENT (AVR) [ using Edwards Intuity Valve Size 57mm;  Surgeon: Rexene Alberts, MD;  Location: [place];  Service: Open Heart Surgery;  Laterality: N/A;  MINI THORACOTOMY  . CARDIAC CATHETERIZATION  11/10/00  . CATARACT EXTRACTION W/ INTRAOCULAR LENS IMPLANT     RIGHT EYE  . CHOLECYSTECTOMY  2008  . COLONOSCOPY  2017   TA  . EYE SURGERY Right 2018  . INGUINAL HERNIA REPAIR  Bilateral 2008  . LUMBAR LAMINECTOMY/DECOMPRESSION MICRODISCECTOMY N/A 04/23/2018   Procedure: Microlumbar decompression Lumbar five-Sacral one;  Surgeon: Susa Day, MD;  Location: [place];  Service: Orthopedics;  Laterality: N/A;  . POLYPECTOMY  2017   TA  . RIGHT/LEFT HEART CATH AND CORONARY ANGIOGRAPHY N/A 10/16/2017   Procedure: RIGHT/LEFT HEART CATH AND CORONARY ANGIOGRAPHY;  Surgeon: [name], MD;  Location: [place] [facility];  Service: Cardiovascular;  Laterality: N/A;  . TEE WITHOUT CARDIOVERSION N/A 12/17/2017   Procedure: TRANSESOPHAGEAL ECHOCARDIOGRAM (TEE);  Surgeon: Rexene Alberts, MD;  Location: [place];  Service: Open Heart Surgery;  Laterality: N/A;  . TONSILLECTOMY  1960    Allergies as of 06/23/2020      Reactions   Atorvastatin Other (See Comments)   REACTION: myalgia   Ezetimibe Other (See Comments)   REACTION: myalgia      Medication List       Accurate as of June 23, 2020 11:59 PM. If you have any questions, ask your nurse or doctor.        STOP taking these medications   tobramycin 0.3 % ophthalmic solution Commonly known as: TOBREX Stopped by: Kathlene November, MD     TAKE these medications   acetaminophen 325 MG tablet Commonly known as: TYLENOL Take 2 tablets (650 mg total) by mouth every 6 (six) hours as  needed for mild pain, moderate pain, fever or headache.   amLODipine 5 MG tablet Commonly known as: NORVASC TAKE 1 TABLET BY MOUTH EVERY DAY   aspirin EC 81 MG tablet Take 81 mg by mouth daily.   carvedilol 12.5 MG tablet Commonly known as: COREG TAKE 1 TABLET (12.5 MG TOTAL) BY MOUTH 2 (TWO) TIMES DAILY WITH A MEAL.   folic acid 1 MG tablet Commonly known as: FOLVITE Take 1 tablet (1 mg total) by mouth daily.   levothyroxine 88 MCG tablet Commonly known as: SYNTHROID Take 1 tablet (88 mcg total) by mouth daily before breakfast.   methotrexate 2.5 MG tablet Commonly known as: RHEUMATREX TAKE 4 TABLETS (10 MG TOTAL) BY MOUTH ONCE  A WEEK.   rosuvastatin 5 MG tablet Commonly known as: CRESTOR Take 1 tablet (5 mg total) by mouth daily.   spironolactone 25 MG tablet Commonly known as: ALDACTONE TAKE 1 TABLET (25 MG TOTAL) BY MOUTH DAILY. PLEASE CALL TO SCHEDULE APPOINTMENT FOR FURTHER REFILLS   VITAMIN D-3 PO Take 2,000 Units by mouth daily.          Objective:   Physical Exam BP 121/69 (BP Location: Left Arm, Patient Position: Sitting, Cuff Size: Normal)   Pulse 64   Temp 97.9 F (36.6 C) (Oral)   Resp 16   Ht 5\' 7"  (1.702 m)   Wt 191 lb (86.6 kg)   SpO2 97%   BMI 29.91 kg/m  General: Well developed, NAD, BMI noted Neck: No  thyromegaly  HEENT:  Normocephalic . Face symmetric, atraumatic Lungs:  CTA B Normal respiratory effort, no intercostal retractions, no accessory muscle use. Heart: RRR,  no murmur.  Abdomen:  Not distended, soft, non-tender. No rebound or rigidity.   Lower extremities: no pretibial edema bilaterally DRE: Normal sphincter tone, brown stools, prostate is normal size, does have a nontender nodule at the left lobule, proximal to the anus, approximately 5 mm. Skin: Exposed areas without rash. Not pale. Not jaundice Neurologic:  alert & oriented X3.  Speech normal, gait appropriate for age and unassisted Strength symmetric and appropriate for age.  Psych: Cognition and judgment appear intact.  Cooperative with normal attention span and concentration.  Behavior appropriate. No anxious or depressed appearing.     Assessment      Assessment HTN Hyperlipidemia Hypothyroidism R.A. 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  Recent BMP satisfactory High cholesterol: On Crestor, check FLP Hypothyroidism: Check a TSH Rheumatoid arthritis: Sees rheumatology regularly. Cardiovascular: Recently saw cardiology  Cold intolerance: Check a CBC RTC 6 months  This visit occurred during the SARS-CoV-2 public health emergency.  Safety protocols were in place, including screening questions prior to the visit, additional usage of staff PPE, and extensive cleaning of exam room while observing appropriate contact time as indicated for disinfecting solutions.

## 2020-06-24 ENCOUNTER — Encounter: Payer: Self-pay | Admitting: Internal Medicine

## 2020-06-24 NOTE — Assessment & Plan Note (Signed)
Here for CPX HTN: Continue amlodipine, carvedilol, Aldactone.  Recent BMP satisfactory High cholesterol: On Crestor, check FLP Hypothyroidism: Check a TSH Rheumatoid arthritis: Sees rheumatology regularly. Cardiovascular: Recently saw cardiology  Cold intolerance: Check a CBC RTC 6 months

## 2020-06-24 NOTE — Assessment & Plan Note (Signed)
-  Td 2020 -PNM 23: 2013;  Prevnar: 2015 -  zostavax:2016 - shingrix : declined strongly -COVID vaccines x3 - Had a flu shot -CCS: per patient: Cscope x 2, last 04-2006, cscope 12- 2017, C-scope 02-2020, next per GI -prostate ca screening: DRE show a prostate nodule, check a PSA, urology referral.   Labs:   FLP, PSA, TSH, CBC. -diet and exercise: counseled

## 2020-06-26 MED ORDER — ROSUVASTATIN CALCIUM 10 MG PO TABS: 10.0000 mg | ORAL_TABLET | Freq: Every day | ORAL | 0 refills | Status: DC

## 2020-06-26 NOTE — Addendum Note (Signed)
Addended byDamita Dunnings D on: 06/26/2020 10:37 AM   Modules accepted: Orders

## 2020-06-28 NOTE — Progress Notes (Signed)
Office Visit Note  Patient: Eric [name]             Date of Birth: 24-Feb-1946           MRN: [id number]             PCP: Colon Branch, MD Referring: Colon Branch, MD Visit Date: 07/12/2020 Occupation: @GUAROCC @  Subjective:  Medication monitoring  History of Present Illness: Eric [name] is a 75 y.o. male with history of seronegative rheumatoid arthritis and osteoarthritis.  Patient is taking methotrexate 4 tablets by mouth once weekly and folic acid 1 mg by mouth daily.  He has not missed any doses of methotrexate recently and continues to tolerate it without any side effects.  He denies any recent rheumatoid arthritis flares.  He is not experiencing any joint pain and has no joint swelling at this time.  He states his morning stiffness has only been lasting about 5 minutes.  He denies any nocturnal pain.  He has not had any difficulty with ADLs. He denies any recent infections.  He has received 3 Pfizer COVID-19 vaccinations.    Activities of Daily Living:  Patient reports morning stiffness for 5  minutes.   Patient Reports nocturnal pain.  Difficulty dressing/grooming: Denies Difficulty climbing stairs: Denies Difficulty getting out of chair: Denies Difficulty using hands for taps, buttons, cutlery, and/or writing: Denies  Review of Systems  Constitutional: Negative for fatigue.  HENT: Negative for mouth sores, mouth dryness and nose dryness.   Eyes: Negative for pain, itching and dryness.  Respiratory: Positive for shortness of breath. Negative for difficulty breathing.   Cardiovascular: Negative for chest pain and palpitations.  Gastrointestinal: Negative for blood in stool, constipation and diarrhea.  Endocrine: Negative for increased urination.  Genitourinary: Negative for difficulty urinating.  Musculoskeletal: Positive for arthralgias, joint pain and morning stiffness. Negative for joint swelling, myalgias, muscle tenderness and myalgias.  Skin: Negative for color  change, rash and redness.  Allergic/Immunologic: Negative for susceptible to infections.  Neurological: Negative for dizziness, numbness, headaches, memory loss and weakness.  Hematological: Positive for bruising/bleeding tendency.  Psychiatric/Behavioral: Negative for confusion and sleep disturbance.    PMFS History:  Patient Active Problem List   Diagnosis Date Noted  . Lumbar stenosis   . Heart murmur   . GERD (gastroesophageal reflux disease)   . Dyspnea   . Cataract   . Arthritis   . Anxiety   . Subarachnoid hemorrhage ([place]) 04/28/2019  . Rheumatoid arthritis ([place]) 11/17/2018  . Spinal stenosis of lumbar region 04/23/2018  . HNP (herniated nucleus pulposus), lumbar 04/23/2018  . Prolapsed lumbar disc 03/03/2018  . Wide-complex tachycardia ([place]) 12/30/2017  . Left bundle branch block (LBBB) 12/18/2017  . 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 Abnormal LFTs   . Macular edema   . PCP NOTES >>>>>> 04/27/2015  . Hyperglycemia 10/20/2014  . Dizziness 04/21/2014  . Annual physical exam 04/15/2011  . Aortic stenosis 04/15/2011  . Hypothyroidism 10/13/2007  . Hyperlipidemia 10/13/2007  . Hypertension 04/08/2007    Past Medical History:  Diagnosis Date  . Abnormal LFTs   . Annual physical exam 04/15/2011  . Anxiety   . Aortic stenosis 04/15/2011  . Arthritis    generalized  . Cataract    sx 2018  . Dizziness 04/21/2014  . Dyspnea   . GERD (gastroesophageal reflux disease)    with certain foods  . Heart murmur   . HNP (herniated nucleus pulposus), lumbar  L5-S1  . Hyperglycemia 10/20/2014  . Hyperlipidemia    on meds  . Hypertension    on meds  . Hypothyroidism    on meds  . Left bundle branch block (LBBB) 12/18/2017  . Lumbar stenosis   . Macular edema    Dr  Sherlynn Stalls   . PCP NOTES >>>>>> 04/27/2015  . Prolapsed lumbar disc 03/03/2018  . Rheumatoid arthritis ([place]) 11/17/2018  . S/P minimally invasive aortic  valve replacement with bioprosthetic valve 12/17/2017   Edwards Intuity Elite rapid deployment stented bovine pericardial tissue valve via right mini thoracotomy approach  . Spinal stenosis of lumbar region 04/23/2018  . Subarachnoid hemorrhage ([place]) 04/28/2019  . Wide-complex tachycardia ([place]) 12/30/2017    Family History  Problem Relation Age of Onset  . Diabetes Mother   . Stroke Mother   . Diabetes Brother        ?  . Coronary artery disease Brother        Male 1st degree relative >50, had CABG in his 76s  . Emphysema Father   . COPD Father   . Diabetes Daughter   . Colon cancer Neg Hx   . Prostate cancer Neg Hx   . Colon polyps Neg Hx   . Esophageal cancer Neg Hx   . Rectal cancer Neg Hx   . Stomach cancer Neg Hx    Past Surgical History:  Procedure Laterality Date  . AORTIC VALVE REPLACEMENT N/A 12/17/2017   Procedure: MINIMALLY INVASIVE AORTIC VALVE REPLACEMENT (AVR) [ using Edwards Intuity Valve Size 21mm;  Surgeon: Rexene Alberts, MD;  Location: [facility];  Service: Open Heart Surgery;  Laterality: N/A;  MINI THORACOTOMY  . CARDIAC CATHETERIZATION  11/10/00  . CATARACT EXTRACTION W/ INTRAOCULAR LENS IMPLANT     RIGHT EYE  . CHOLECYSTECTOMY  2008  . COLONOSCOPY  2017   TA  . EYE SURGERY Right 2018  . INGUINAL HERNIA REPAIR Bilateral 2008  . LUMBAR LAMINECTOMY/DECOMPRESSION MICRODISCECTOMY N/A 04/23/2018   Procedure: Microlumbar decompression Lumbar five-Sacral one;  Surgeon: Susa Day, MD;  Location: [facility];  Service: Orthopedics;  Laterality: N/A;  . POLYPECTOMY  2017   TA  . RIGHT/LEFT HEART CATH AND CORONARY ANGIOGRAPHY N/A 10/16/2017   Procedure: RIGHT/LEFT HEART CATH AND CORONARY ANGIOGRAPHY;  Surgeon: [name], MD;  Location: [facility];  Service: Cardiovascular;  Laterality: N/A;  . TEE WITHOUT CARDIOVERSION N/A 12/17/2017   Procedure: TRANSESOPHAGEAL ECHOCARDIOGRAM (TEE);  Surgeon: Rexene Alberts, MD;  Location: [facility];  Service: Open Heart  Surgery;  Laterality: N/A;  . [place] History Narrative   Lives w/ wife   3 children, 8 g-kids             Immunization History  Administered Date(s) Administered  . Fluad Quad(high Dose 65+) 02/12/2019, 03/14/2020  . Influenza Split 03/11/2013, 02/17/2014  . Influenza Whole 04/13/2007, 03/15/2008, 03/10/2012  . Influenza, High Dose Seasonal PF 04/04/2015, 03/28/2016, 03/18/2017, 03/16/2018  . PFIZER(Purple Top)SARS-COV-2 Vaccination 08/08/2019, 08/31/2019, 03/24/2020  . Pneumococcal Conjugate-13 04/21/2014  . Pneumococcal Polysaccharide-23 04/15/2012  . Td 06/10/1996, 06/10/2000  . Tdap 04/15/2011, 10/11/2018  . Zoster 03/29/2015     Objective: Vital Signs: BP 131/79 (BP Location: Left Arm, Patient Position: Sitting, Cuff Size: Normal)   Pulse 64   Resp 16   Ht 5\' 7"  (1.702 m)   Wt 193 lb 6.4 oz (87.7 kg)   BMI 30.29 kg/m    Physical Exam  Vitals and nursing note reviewed.  Constitutional:      Appearance: He is well-developed and well-nourished.  HENT:     Head: Normocephalic and atraumatic.  Eyes:     Extraocular Movements: EOM normal.     Conjunctiva/sclera: Conjunctivae normal.     Pupils: Pupils are equal, round, and reactive to light.  Pulmonary:     Effort: Pulmonary effort is normal.  Abdominal:     Palpations: Abdomen is soft.  Musculoskeletal:     Cervical back: Normal range of motion and neck supple.  Skin:    General: Skin is warm and dry.     Capillary Refill: Capillary refill takes less than 2 seconds.  Neurological:     Mental Status: He is alert and oriented to person, place, and time.  Psychiatric:        Mood and Affect: Mood and affect normal.        Behavior: Behavior normal.      Musculoskeletal Exam: C-spine, thoracic spine, lumbar spine have good range of motion with no discomfort.  Shoulder joints, elbow joints, wrist joints, MCPs, PIPs, DIPs have good range of motion with no synovitis.  He has  PIP and DIP thickening consistent with osteoarthritis of both hands.  He is able to make a complete fist bilaterally.  Hip joints have good range of motion with no discomfort.  Knee joints have good range of motion with no warmth or effusion.  No knee crepitus noted.  Ankle joints have good range of motion with no tenderness or inflammation.  No tenderness over trochanteric bursa bilaterally.   CDAI Exam: CDAI Score: -- Patient Global: --; Provider Global: -- Swollen: --; Tender: -- Joint Exam 07/12/2020   No joint exam has been documented for this visit   There is currently no information documented on the homunculus. Go to the Rheumatology activity and complete the homunculus joint exam.  Investigation: No additional findings.  Imaging: No results found.  Recent Labs: Lab Results  Component Value Date   WBC 5.2 06/23/2020   HGB 13.9 06/23/2020   PLT 227.0 06/23/2020   NA 140 05/10/2020   K 4.3 05/10/2020   CL 105 05/10/2020   CO2 28 05/10/2020   GLUCOSE 102 (H) 05/10/2020   BUN 13 05/10/2020   CREATININE 1.12 05/10/2020   BILITOT 0.8 05/10/2020   ALKPHOS 68 05/21/2019   AST 24 05/10/2020   ALT 19 05/10/2020   PROT 7.0 05/10/2020   ALBUMIN 4.0 05/21/2019   CALCIUM 9.5 05/10/2020   GFRAA 75 05/10/2020   QFTBGOLDPLUS NEGATIVE 08/24/2018    Speciality Comments: No specialty comments available.  Procedures:  No procedures performed Allergies: Atorvastatin and Ezetimibe   Assessment / Plan:     Visit Diagnoses: Seronegative rheumatoid arthritis ([place]): He has no joint tenderness or synovitis on exam.  He has not had any recent rheumatoid arthritis flares.  He is clinically doing well taking methotrexate 4 tablets by mouth once weekly and folic acid 1 mg by mouth daily.  He has not missed any doses of methotrexate recently and has been tolerating it without any side effects.  He has not had any recent infections.  He is not experiencing any joint pain and has no  inflammation on examination today.  His morning stiffness has been lasting about 5 minutes.  He has not had any nocturnal pain or difficulty with ADLs.  He will continue taking methotrexate and folic acid as prescribed.  He does not need any refills at  this time.  He was advised to notify us if he develops increased joint pain or joint swelling.  He will follow-up in the office in 5 months.  High risk medication use - Methotrexate 4 tablets by mouth once weekly and folic acid 1 mg po daily.  CBC WNL on 06/23/20.  CMP updated on 05/10/20. He will be due to update lab work in March and every 3 months to monitor for drug toxicity.  Standing orders for CBC and CMP remain in place. He has not had any recent infections.  He was advised to hold methotrexate if he develop signs or symptoms of an infection and to resume once the infection has completely cleared.  He voiced understanding. He has received 3 COVID-19 vaccinations. He can receive the booster dose in March/April 2022.  He was advised to hold methotrexate 1 week after receiving the booster dose.   Primary osteoarthritis of both hands: He has PIP and DIP thickening consistent with osteoarthritis of both hands.  He is able to make a complete fist bilaterally.  He has no joint tenderness or inflammation.  We discussed the importance of joint protection and muscle strengthening.  Primary osteoarthritis of both knees: He has good range of motion of both knee joints.  No warmth or effusion was noted.  No knee crepitus noted.  He has not had any difficulty climbing steps or rising from a seated position.  Primary osteoarthritis of both feet: He is not experiencing any discomfort in his feet at this time.  He has good range of motion of both ankle joints with no tenderness or inflammation.  He is wearing proper fitting shoes.  Spinal stenosis of lumbar region, unspecified whether neurogenic claudication present: He is not experiencing any lower back pain at this  time.  He has no symptoms of radiculopathy.  Osteopenia of multiple sites: DEXA updated on 04/23/2019 right femoral neck BMD 0.776 with T score -1.1.  No comparison.  He has not had any recent falls or fractures.  He continues to take vitamin D 2000 units daily and obtains calcium through his diet.  Other medical conditions are listed as follows:  History of anxiety  Essential hypertension  Left bundle branch block (LBBB)  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  History of hypothyroidism  Family history of psoriasis  Orders: No orders of the defined types were placed in this encounter.  No orders of the defined types were placed in this encounter.    Follow-Up Instructions: Return in about 5 months (around 12/09/2020) for Rheumatoid arthritis, Osteoarthritis.   Ofilia Neas, PA-C  Note - This record has been created using Dragon software.  Chart creation errors have been sought, but may not always  have been located. Such creation errors do not reflect on  the standard of medical care.

## 2020-06-29 ENCOUNTER — Other Ambulatory Visit: Payer: Self-pay | Admitting: Rheumatology

## 2020-06-29 NOTE — Telephone Encounter (Signed)
Last Visit: 02/09/2020 Next Visit: 07/12/2020  Current Dose per office note on 09/11/345, folic acid 1 mg po daily Dx: Seronegative rheumatoid arthritis   Okay to refill folic acid?

## 2020-07-12 ENCOUNTER — Encounter: Payer: Self-pay | Admitting: Rheumatology

## 2020-07-12 ENCOUNTER — Other Ambulatory Visit: Payer: Self-pay

## 2020-07-12 ENCOUNTER — Ambulatory Visit: Payer: Medicare Other | Admitting: Physician Assistant

## 2020-07-12 VITALS — BP 131/79 | HR 64 | Resp 16 | Ht 67.0 in | Wt 193.4 lb

## 2020-07-12 DIAGNOSIS — M19042 Primary osteoarthritis, left hand: Secondary | ICD-10-CM

## 2020-07-12 DIAGNOSIS — Z953 Presence of xenogenic heart valve: Secondary | ICD-10-CM

## 2020-07-12 DIAGNOSIS — M19071 Primary osteoarthritis, right ankle and foot: Secondary | ICD-10-CM

## 2020-07-12 DIAGNOSIS — Z8659 Personal history of other mental and behavioral disorders: Secondary | ICD-10-CM

## 2020-07-12 DIAGNOSIS — M19041 Primary osteoarthritis, right hand: Secondary | ICD-10-CM | POA: Diagnosis not present

## 2020-07-12 DIAGNOSIS — M170 Bilateral primary osteoarthritis of knee: Secondary | ICD-10-CM

## 2020-07-12 DIAGNOSIS — M8589 Other specified disorders of bone density and structure, multiple sites: Secondary | ICD-10-CM

## 2020-07-12 DIAGNOSIS — Z79899 Other long term (current) drug therapy: Secondary | ICD-10-CM | POA: Diagnosis not present

## 2020-07-12 DIAGNOSIS — M19072 Primary osteoarthritis, left ankle and foot: Secondary | ICD-10-CM

## 2020-07-12 DIAGNOSIS — Z8639 Personal history of other endocrine, nutritional and metabolic disease: Secondary | ICD-10-CM

## 2020-07-12 DIAGNOSIS — M0600 Rheumatoid arthritis without rheumatoid factor, unspecified site: Secondary | ICD-10-CM

## 2020-07-12 DIAGNOSIS — M48061 Spinal stenosis, lumbar region without neurogenic claudication: Secondary | ICD-10-CM

## 2020-07-12 DIAGNOSIS — Z840 Family history of diseases of the skin and subcutaneous tissue: Secondary | ICD-10-CM

## 2020-07-12 DIAGNOSIS — I447 Left bundle-branch block, unspecified: Secondary | ICD-10-CM

## 2020-07-12 DIAGNOSIS — I10 Essential (primary) hypertension: Secondary | ICD-10-CM

## 2020-07-12 NOTE — Patient Instructions (Signed)
Standing Labs We placed an order today for your standing lab work.   Please have your standing labs drawn at end of March and every 3 months   If possible, please have your labs drawn 2 weeks prior to your appointment so that the provider can discuss your results at your appointment.  We have open lab daily Monday through Thursday from 8:30-12:30 PM and 1:30-4:30 PM and Friday from 8:30-12:30 PM and 1:30-4:00 PM at the office of Dr. [name], [facility].   Please be advised, all patients with office appointments requiring lab work will take precedents over walk-in lab work.  If possible, please come for your lab work on Monday and Friday afternoons, as you may experience shorter wait times. The office is located at [address] [place], [place], [place] [address] No appointment is necessary.   Labs are drawn by [facility]. Please bring your co-pay at the time of your lab draw.  You may receive a bill from [facility] for your lab work.  If you wish to have your labs drawn at another location, please call the office 24 hours in advance to send orders.  If you have any questions regarding directions or hours of operation,  please call [phone].   As a reminder, please drink plenty of water prior to coming for your lab work. Thanks!

## 2020-08-05 ENCOUNTER — Other Ambulatory Visit: Payer: Self-pay | Admitting: Rheumatology

## 2020-08-07 ENCOUNTER — Other Ambulatory Visit: Payer: Self-pay

## 2020-08-07 ENCOUNTER — Other Ambulatory Visit (INDEPENDENT_AMBULATORY_CARE_PROVIDER_SITE_OTHER): Payer: Medicare Other

## 2020-08-07 DIAGNOSIS — E785 Hyperlipidemia, unspecified: Secondary | ICD-10-CM

## 2020-08-07 LAB — LIPID PANEL
Cholesterol: 150 mg/dL (ref 0–200)
HDL: 44 mg/dL (ref >39.00)
LDL Cholesterol: 75 mg/dL (ref 0–99)
NonHDL: 106.47
Total CHOL/HDL Ratio: 3
Triglycerides: 159 mg/dL — ABNORMAL HIGH (ref 0.0–149.0)
VLDL: 31.8 mg/dL (ref 0.0–40.0)

## 2020-08-07 LAB — AST: AST: 28 U/L (ref 0–37)

## 2020-08-07 LAB — ALT: ALT: 22 U/L (ref 0–53)

## 2020-08-07 NOTE — Telephone Encounter (Signed)
Last Visit: 07/12/2020 Next Visit: 12/13/2020 Labs: 05/10/2020 CBC and CMP are normal.  Current Dose per office note 07/13/2019: Methotrexate 4 tablets by mouth once weekly  DX: Seronegative rheumatoid arthritis   Last Fill: 05/12/2020  Patient advised he is due for labs. Patient states he will come in and update.   Okay to refill MTX?

## 2020-08-10 MED ORDER — ROSUVASTATIN CALCIUM 10 MG PO TABS: 10.0000 mg | ORAL_TABLET | Freq: Every day | ORAL | 3 refills | Status: DC

## 2020-08-10 NOTE — Addendum Note (Signed)
Addended byDamita Dunnings D on: 08/10/2020 07:45 AM   Modules accepted: Orders

## 2020-08-11 ENCOUNTER — Other Ambulatory Visit: Payer: Self-pay

## 2020-08-11 DIAGNOSIS — Z79899 Other long term (current) drug therapy: Secondary | ICD-10-CM

## 2020-08-12 LAB — CBC WITH DIFFERENTIAL/PLATELET

## 2020-08-12 LAB — COMPLETE METABOLIC PANEL WITH GFR
AG Ratio: 1.8 (calc) (ref 1.0–2.5)
ALT: 18 U/L (ref 9–46)
AST: 28 U/L (ref 10–35)
Albumin: 4.3 g/dL (ref 3.6–5.1)
Alkaline phosphatase (APISO): 65 U/L (ref 35–144)
BUN/Creatinine Ratio: 12 (calc) (ref 6–22)
BUN: 14 mg/dL (ref 7–25)
CO2: 21 mmol/L (ref 20–32)
Calcium: 9.4 mg/dL (ref 8.6–10.3)
Chloride: 107 mmol/L (ref 98–110)
Creat: 1.21 mg/dL — ABNORMAL HIGH (ref 0.70–1.18)
GFR, Est African American: 68 mL/min/{1.73_m2} (ref >=60)
GFR, Est Non African American: 59 mL/min/{1.73_m2} — ABNORMAL LOW (ref >=60)
Globulin: 2.4 g/dL (calc) (ref 1.9–3.7)
Glucose, Bld: 86 mg/dL (ref 65–99)
Potassium: 4.5 mmol/L (ref 3.5–5.3)
Sodium: 139 mmol/L (ref 135–146)
Total Bilirubin: 1 mg/dL (ref 0.2–1.2)
Total Protein: 6.7 g/dL (ref 6.1–8.1)

## 2020-08-12 NOTE — Progress Notes (Signed)
CBC is normal.  Creatinine is mildly elevated, most likely due to the use of diuretics.  We will recheck labs in 3 months

## 2020-08-14 ENCOUNTER — Other Ambulatory Visit: Payer: Self-pay | Admitting: Rheumatology

## 2020-08-14 ENCOUNTER — Other Ambulatory Visit: Payer: Self-pay | Admitting: Internal Medicine

## 2020-08-14 LAB — CBC WITH DIFFERENTIAL/PLATELET
Absolute Monocytes: 497 cells/uL (ref 200–950)
Basophils Absolute: 11 cells/uL (ref 0–200)
Basophils Relative: 0.2 %
Eosinophils Absolute: 49 cells/uL (ref 15–500)
Eosinophils Relative: 0.9 %
HCT: 39.3 % (ref 38.5–50.0)
Hemoglobin: 13.3 g/dL (ref 13.2–17.1)
Lymphs Abs: 1544 cells/uL (ref 850–3900)
MCH: 32.6 pg (ref 27.0–33.0)
MCHC: 33.8 g/dL (ref 32.0–36.0)
MCV: 96.3 fL (ref 80.0–100.0)
MPV: 9.6 fL (ref 7.5–12.5)
Monocytes Relative: 9.2 %
Neutro Abs: 3299 cells/uL (ref 1500–7800)
Neutrophils Relative %: 61.1 %
Platelets: 223 10*3/uL (ref 140–400)
RBC: 4.08 10*6/uL — ABNORMAL LOW (ref 4.20–5.80)
RDW: 13.1 % (ref 11.0–15.0)
Total Lymphocyte: 28.6 %
WBC: 5.4 10*3/uL (ref 3.8–10.8)

## 2020-08-15 NOTE — Progress Notes (Signed)
CBC is normal.

## 2020-08-24 ENCOUNTER — Other Ambulatory Visit: Payer: Self-pay

## 2020-08-24 ENCOUNTER — Other Ambulatory Visit: Payer: Self-pay | Admitting: Internal Medicine

## 2020-08-24 MED ORDER — SPIRONOLACTONE 25 MG PO TABS: 25.0000 mg | ORAL_TABLET | Freq: Every day | ORAL | 2 refills | Status: DC

## 2020-08-24 NOTE — Telephone Encounter (Signed)
Spironolactone approved and sent 

## 2020-08-31 ENCOUNTER — Other Ambulatory Visit: Payer: Self-pay | Admitting: Cardiology

## 2020-09-06 ENCOUNTER — Other Ambulatory Visit: Payer: Self-pay | Admitting: Cardiology

## 2020-09-06 NOTE — Telephone Encounter (Signed)
Refill sent to pharmacy.   

## 2020-09-20 HISTORY — PX: PROSTATE BIOPSY: SHX241

## 2020-10-23 ENCOUNTER — Encounter: Payer: Self-pay | Admitting: Radiation Oncology

## 2020-10-23 NOTE — Progress Notes (Signed)
GU Location of Tumor / Histology: prostatic adenocarcinoma  If Prostate Cancer, Gleason Score is (3 + 4) and PSA is (3.76), Prostate volume: 27 g.  Eric [name] presented to Dr. Jeffie Pollock for further evaluation of a prostate nodule and elevated PSA discovered by his PCP.  Biopsies of prostate (if applicable) revealed:    Past/Anticipated interventions by urology, if any: prostate biopsy, referral to Dr. Tammi Klippel for consideration of seed implant  Past/Anticipated interventions by medical oncology, if any: no  Weight changes, if any: no  Bowel/Bladder complaints, if any: IPSS 3. SHIM 2. Denies dysuria, hematuria, urinary leakage or incontinence. Reports occasional nausea but denies emesis. Reports occasional diarrhea following consumption of spicy foods.   Nausea/Vomiting, if any: yes, nausea occasionally  Pain issues, if any:  denies  SAFETY ISSUES:  Prior radiation? denies  Pacemaker/ICD? No had aortic valve replacement  Possible current pregnancy? no, male patient  Is the patient on methotrexate? yes  Current Complaints / other details:  75 year old male. Married with two daughters and one son. 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

## 2020-10-24 ENCOUNTER — Encounter: Payer: Self-pay | Admitting: Radiation Oncology

## 2020-10-24 ENCOUNTER — Other Ambulatory Visit: Payer: Self-pay

## 2020-10-24 ENCOUNTER — Ambulatory Visit
Admission: RE | Admit: 2020-10-24 | Discharge: 2020-10-24 | Disposition: A | Discharge status: Home / Self Care | Payer: Medicare Other | Source: Ambulatory Visit | Attending: Radiation Oncology | Admitting: Radiation Oncology

## 2020-10-24 ENCOUNTER — Encounter: Payer: Self-pay | Admitting: Medical Oncology

## 2020-10-24 VITALS — BP 128/69 | HR 59 | Temp 97.8°F | Resp 20 | Ht 68.0 in | Wt 191.2 lb

## 2020-10-24 DIAGNOSIS — E785 Hyperlipidemia, unspecified: Secondary | ICD-10-CM | POA: Diagnosis not present

## 2020-10-24 DIAGNOSIS — E039 Hypothyroidism, unspecified: Secondary | ICD-10-CM | POA: Diagnosis not present

## 2020-10-24 DIAGNOSIS — Z79899 Other long term (current) drug therapy: Secondary | ICD-10-CM | POA: Diagnosis not present

## 2020-10-24 DIAGNOSIS — R011 Cardiac murmur, unspecified: Secondary | ICD-10-CM | POA: Insufficient documentation

## 2020-10-24 DIAGNOSIS — I447 Left bundle-branch block, unspecified: Secondary | ICD-10-CM | POA: Insufficient documentation

## 2020-10-24 DIAGNOSIS — K219 Gastro-esophageal reflux disease without esophagitis: Secondary | ICD-10-CM | POA: Diagnosis not present

## 2020-10-24 DIAGNOSIS — Z87891 Personal history of nicotine dependence: Secondary | ICD-10-CM | POA: Insufficient documentation

## 2020-10-24 DIAGNOSIS — Z7982 Long term (current) use of aspirin: Secondary | ICD-10-CM | POA: Diagnosis not present

## 2020-10-24 DIAGNOSIS — M069 Rheumatoid arthritis, unspecified: Secondary | ICD-10-CM | POA: Diagnosis not present

## 2020-10-24 DIAGNOSIS — M48061 Spinal stenosis, lumbar region without neurogenic claudication: Secondary | ICD-10-CM | POA: Insufficient documentation

## 2020-10-24 DIAGNOSIS — I350 Nonrheumatic aortic (valve) stenosis: Secondary | ICD-10-CM | POA: Insufficient documentation

## 2020-10-24 DIAGNOSIS — C61 Malignant neoplasm of prostate: Secondary | ICD-10-CM | POA: Insufficient documentation

## 2020-10-24 DIAGNOSIS — I10 Essential (primary) hypertension: Secondary | ICD-10-CM | POA: Insufficient documentation

## 2020-10-24 DIAGNOSIS — R948 Abnormal results of function studies of other organs and systems: Secondary | ICD-10-CM | POA: Diagnosis not present

## 2020-10-24 HISTORY — DX: Malignant neoplasm of prostate: C61

## 2020-10-24 NOTE — Progress Notes (Signed)
Radiation Oncology         [phone]) [phone] ________________________________  Initial outpatient Consultation  Name: Eric [name] MRN: [id number]  Date: 10/24/2020  DOB: [birth year]  CC:Paz, Alda Berthold, MD  Irine Seal, MD   REFERRING PHYSICIAN: Irine Seal, MD  DIAGNOSIS: 75 y.o. gentleman with Stage T2a adenocarcinoma of the prostate with Gleason score of 3+4, and PSA of 3.76.    ICD-10-CM   1. Malignant neoplasm of prostate ([place])  C61     HISTORY OF PRESENT ILLNESS: Eric [name] is a 75 y.o. male with a diagnosis of prostate cancer. He was noted to have a prostate nodule and rising PSA of 3.76 by his primary care physician, Dr. Larose Kells. Prior PSA was 2.32 in 02/2018.  Accordingly, he was referred for evaluation in urology by Dr. Jeffie Pollock on 07/26/20,  digital rectal examination was performed at that time revealing a 5 mm left apex prostate nodule.  The patient proceeded to transrectal ultrasound with 12 biopsies of the prostate on 09/20/20.  The prostate volume measured 27 cc.  Out of 12 core biopsies, 3 were positive.  The maximum Gleason score was 3+4, and this was seen in the left mid lateral. Additionally, Gleason 3+3 was seen in the left apex lateral (small focus) and left apex.  The patient reviewed the biopsy results with his urologist and he has kindly been referred today for discussion of potential radiation treatment options.   PREVIOUS RADIATION THERAPY: No  PAST MEDICAL HISTORY:  Past Medical History:  Diagnosis Date  . Abnormal LFTs   . Annual physical exam 04/15/2011  . Anxiety   . Aortic stenosis 04/15/2011  . Arthritis    generalized  . Cataract    sx 2018  . Dizziness 04/21/2014  . Dyspnea   . GERD (gastroesophageal reflux disease)    with certain foods  . Heart murmur   . HNP (herniated nucleus pulposus), lumbar    L5-S1  . Hyperglycemia 10/20/2014  . Hyperlipidemia    on meds  . Hypertension    on meds  . Hypothyroidism    on meds  . Left bundle branch block  (LBBB) 12/18/2017  . Lumbar stenosis   . Macular edema    Dr  Sherlynn Stalls   . PCP NOTES >>>>>> 04/27/2015  . Prolapsed lumbar disc 03/03/2018  . Prostate cancer ([place])   . Rheumatoid arthritis ([place]) 11/17/2018  . 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 Spinal stenosis of lumbar region 04/23/2018  . Subarachnoid hemorrhage ([place]) 04/28/2019  . Wide-complex tachycardia ([place]) 12/30/2017      PAST SURGICAL HISTORY: Past Surgical History:  Procedure Laterality Date  . AORTIC VALVE REPLACEMENT N/A 12/17/2017   Procedure: MINIMALLY INVASIVE AORTIC VALVE REPLACEMENT (AVR) [ using Edwards Intuity Valve Size 43m;  Surgeon: ORexene Alberts MD;  [facility]  Service: Open Heart Surgery;  Laterality: N/A;  MINI THORACOTOMY  . CARDIAC CATHETERIZATION  11/10/00  . CATARACT EXTRACTION W/ INTRAOCULAR LENS IMPLANT     RIGHT EYE  . CHOLECYSTECTOMY  2008  . COLONOSCOPY  2017   TA  . EYE SURGERY Right 2018  . INGUINAL HERNIA REPAIR Bilateral 2008  . LUMBAR LAMINECTOMY/DECOMPRESSION MICRODISCECTOMY N/A 04/23/2018   Procedure: Microlumbar decompression Lumbar five-Sacral one;  Surgeon: BSusa Day MD;  [facility]  Service: Orthopedics;  Laterality: N/A;  . POLYPECTOMY  2017  TA  . PROSTATE BIOPSY  09/20/2020  . RIGHT/LEFT HEART CATH AND CORONARY ANGIOGRAPHY N/A 10/16/2017   Procedure: RIGHT/LEFT HEART CATH AND CORONARY ANGIOGRAPHY;  Surgeon: [name], MD;  Location: [facility];  Service: Cardiovascular;  Laterality: N/A;  . TEE WITHOUT CARDIOVERSION N/A 12/17/2017   Procedure: TRANSESOPHAGEAL ECHOCARDIOGRAM (TEE);  Surgeon: Rexene Alberts, MD;  Location: [facility];  Service: Open Heart Surgery;  Laterality: N/A;  . TONSILLECTOMY  1960    FAMILY HISTORY:  Family History  Problem Relation Age of Onset  . Diabetes  Mother   . Stroke Mother   . Diabetes Brother        ?  . Coronary artery disease Brother        Male 1st degree relative >50, had CABG in his 67s  . Emphysema Father   . COPD Father   . Diabetes Daughter   . Colon cancer Neg Hx   . Prostate cancer Neg Hx   . Colon polyps Neg Hx   . Esophageal cancer Neg Hx   . Rectal cancer Neg Hx   . Stomach cancer Neg Hx     SOCIAL HISTORY:  Social History   Socioeconomic History  . Marital status: Married    Spouse name: Not on file  . Number of children: 3  . Years of education: Not on file  . Highest education level: Not on file  Occupational History  . Occupation: retired- used to work for [employer]: 05/2008  Tobacco Use  . Smoking status: Former Smoker    Packs/day: 2.00    Years: 35.00    Pack years: 70.00    Quit date: 04/15/1990    Years since quitting: 30.5  . Smokeless tobacco: Never Used  . Tobacco comment: used to smoke 2 ppd  Vaping Use  . Vaping Use: Never used  Substance and Sexual Activity  . Alcohol use: Not Currently    Comment: no ETOH since the 90s  . Drug use: No  . Sexual activity: Not Currently  Other Topics Concern  . Not on file  Social History Narrative   Lives w/ wife   3 children, 8 g-kids             Social Determinants of Health   Financial Resource Strain: Not on file  Food Insecurity: Not on file  Transportation Needs: Not on file  Physical Activity: Not on file  Stress: Not on file  Social Connections: Not on file  Intimate Partner Violence: Not on file    ALLERGIES: Atorvastatin and Ezetimibe  MEDICATIONS:  Current Outpatient Medications  Medication Sig Dispense Refill  . acetaminophen (TYLENOL) 325 MG tablet Take 2 tablets (650 mg total) by mouth every 6 (six) hours as needed for mild pain, moderate pain, fever or headache.    Marland Kitchen amLODipine (NORVASC) 5 MG tablet TAKE 1 TABLET BY MOUTH EVERY DAY 90 tablet 1  . aspirin EC 81 MG tablet Take 81 mg by mouth daily.    .  carvedilol (COREG) 12.5 MG tablet TAKE 1 TABLET (12.5 MG TOTAL) BY MOUTH 2 (TWO) TIMES DAILY WITH A MEAL. 180 tablet 1  . Cholecalciferol (VITAMIN D-3 PO) Take 2,000 Units by mouth daily.    . folic acid (FOLVITE) 1 MG tablet TAKE 1 TABLET BY MOUTH EVERY DAY 90 tablet 2  . levothyroxine (SYNTHROID) 88 MCG tablet Take 1 tablet (88 mcg total) by mouth daily before breakfast. 90 tablet 1  .  methotrexate (RHEUMATREX) 2.5 MG tablet TAKE 4 TABLETS (10 MG TOTAL) BY MOUTH ONCE A WEEK. 48 tablet 0  . rosuvastatin (CRESTOR) 10 MG tablet Take 1 tablet (10 mg total) by mouth at bedtime. 90 tablet 3  . spironolactone (ALDACTONE) 25 MG tablet Take 1 tablet (25 mg total) by mouth daily. 90 tablet 2   No current facility-administered medications for this encounter.    REVIEW OF SYSTEMS:  On review of systems, the patient reports that he is doing well overall. He denies any chest pain, shortness of breath, cough, fevers, chills, night sweats, unintended weight changes. He denies abdominal pain, or vomiting. He denies any new musculoskeletal or joint aches or pains. He reports occasional nausea without emesis. He also notes diarrhea related to consumption of spicy foods. His IPSS was 3, indicating mild urinary symptoms. His SHIM was 2, indicating he has severe erectile dysfunction. A complete review of systems is obtained and is otherwise negative.    PHYSICAL EXAM:  Wt Readings from Last 3 Encounters:  10/24/20 191 lb 3.2 oz (86.7 kg)  07/12/20 193 lb 6.4 oz (87.7 kg)  06/23/20 191 lb (86.6 kg)   Temp Readings from Last 3 Encounters:  10/24/20 97.8 F (36.6 C)  06/23/20 97.9 F (36.6 C) (Oral)  02/29/20 (!) 96.9 F (36.1 C)   BP Readings from Last 3 Encounters:  10/24/20 128/69  07/12/20 131/79  06/23/20 121/69   Pulse Readings from Last 3 Encounters:  10/24/20 (!) 59  07/12/20 64  06/23/20 64   Pain Assessment Pain Score: 0-No pain/10  In general this is a well appearing Caucasian male in no  acute distress. He's alert and oriented x4 and appropriate throughout the examination. Cardiopulmonary assessment is negative for acute distress, and he exhibits normal effort.     KPS = 100  100 - Normal; no complaints; no evidence of disease. 90   - Able to carry on normal activity; minor signs or symptoms of disease. 80   - Normal activity with effort; some signs or symptoms of disease. 109   - Cares for self; unable to carry on normal activity or to do active work. 60   - Requires occasional assistance, but is able to care for most of his personal needs. 50   - Requires considerable assistance and frequent medical care. 24   - Disabled; requires special care and assistance. 60   - Severely disabled; hospital admission is indicated although death not imminent. 34   - Very sick; hospital admission necessary; active supportive treatment necessary. 10   - Moribund; fatal processes progressing rapidly. 0     - Dead  Karnofsky DA, Abelmann [place], Craver LS and Burchenal [facility] [phone]) The use of the nitrogen mustards in the palliative treatment of carcinoma: with particular reference to bronchogenic carcinoma Cancer 1 634-56  LABORATORY DATA:  Lab Results  Component Value Date   WBC 5.4 08/14/2020   HGB 13.3 08/14/2020   HCT 39.3 08/14/2020   MCV 96.3 08/14/2020   PLT 223 08/14/2020   Lab Results  Component Value Date   NA 139 08/11/2020   K 4.5 08/11/2020   CL 107 08/11/2020   CO2 21 08/11/2020   Lab Results  Component Value Date   ALT 18 08/11/2020   AST 28 08/11/2020   ALKPHOS 68 05/21/2019   BILITOT 1.0 08/11/2020     RADIOGRAPHY: No results found.    IMPRESSION/PLAN: 1. 75 y.o. gentleman with Stage T2a adenocarcinoma of the prostate with  Gleason Score of 3+4, and PSA of 3.76. We discussed the patient's workup and outlined the nature of prostate cancer in this setting. The patient's T stage, Gleason's score, and PSA put him into the favorable intermediate risk group.  Accordingly, he is eligible for a variety of potential treatment options including brachytherapy, 5.5 weeks of external radiation, or prostatectomy. We also discussed active surveillance in the setting of low volume disease with only 1 core of 3+4 and the remaining 2 positive cores with Gleason 3+3. We discussed the available radiation techniques, and focused on the details and logistics of delivery. We discussed and outlined the risks, benefits, short and long-term effects associated with radiotherapy and compared and contrasted these with prostatectomy. We discussed the role of SpaceOAR gel in reducing the rectal toxicity associated with radiotherapy.  He appears to have a good understanding of his disease and our treatment recommendations which are of curative intent.  He and his wife were encouraged to ask questions that were answered to their stated satisfaction.  At the conclusion of our conversation, the patient remains undecided regarding his final treatment preference. He appears to be leaning towards continuing in active surveillance for now. He is scheduled for repeat PSA in 12/2020 and plans to make a decision at that time regarding moving forward with treatment, either brachytherapy or 5.5 weeks of EBRT. He met with our nurse navigator, Cira Rue, who gave him her card and he plans to contact her with his decision.  I personally spent 60 minutes in this encounter including chart review, reviewing radiological studies, meeting face-to-face with the patient, entering orders and completing documentation.    [name], PA-C    Tyler Pita, MD  [facility] Direct Dial: [phone]  Fax: [phone] [email or web address]  Skype  [email or web address]   This document serves as a record of services personally performed by Tyler Pita, MD and Freeman Caldron, PA-C. It was created on their behalf by Wilburn Mylar, a trained medical scribe. The creation of this record is  based on the scribe's personal observations and the provider's statements to them. This document has been checked and approved by the attending provider.

## 2020-10-24 NOTE — Progress Notes (Signed)
Introduced myself to the patient and his wife as the prostate navigator and discussed my role. He is here to discuss his radiation treatment options. No barriers to care identified at this time. I gave them my contact information and asked them to call me with questions or concerns.

## 2020-10-25 ENCOUNTER — Encounter: Payer: Self-pay | Admitting: Licensed Clinical Social Worker

## 2020-10-25 NOTE — Progress Notes (Signed)
Eric [name] Psychosocial Distress Screening Clinical Social Work  Clinical Social Work was referred by distress screening protocol.  The patient scored a 5 on the Psychosocial Distress Thermometer which indicates moderate distress. Clinical Social Worker contacted patient by phone to assess for distress and other psychosocial needs.  Spoke with patient & pt's wife and described support services available should patient decide to receive treatment here as well as support groups regardless of treatment choice.  Pt & wife denied resource needs at this time. CSW e-mailed patient monthly calendar of support programs.  ONCBCN DISTRESS SCREENING 10/25/2020  Screening Type Initial Screening  Distress experienced in past week (1-10) 5  Family Problem type Other (comment)  Emotional problem type Adjusting to illness  Spiritual/Religous concerns type Relating to God  Physical Problem type Nausea/vomiting;Breathing  Physician notified of physical symptoms Yes  Referral to clinical psychology No  Referral to clinical social work No  Referral to dietition No  Referral to financial advocate No  Referral to support programs Yes  Referral to palliative care No  Other [phone]    Clinical Social Worker follow up needed: No.  If yes, follow up plan:  Eric [name] E Eric Obryant, LCSW

## 2020-10-27 ENCOUNTER — Encounter: Payer: Self-pay | Admitting: Medical Oncology

## 2020-10-28 ENCOUNTER — Other Ambulatory Visit: Payer: Self-pay | Admitting: Rheumatology

## 2020-10-30 NOTE — Telephone Encounter (Signed)
Next Visit:  12/13/2020  Last Visit: 07/12/2020  Last Fill: 08/07/2020  DX: Seronegative rheumatoid arthritis   Current Dose per office note 07/12/2020, Methotrexate 4 tablets by mouth once weekly  Labs: 08/14/2020, CBC is normal. Creatinine is mildly elevated, most likely due to the use of diuretics. We will recheck labs in 3 months  Okay to refill MTX?

## 2020-10-31 ENCOUNTER — Encounter: Payer: Self-pay | Admitting: Medical Oncology

## 2020-10-31 ENCOUNTER — Other Ambulatory Visit: Payer: Self-pay | Admitting: Internal Medicine

## 2020-10-31 NOTE — Progress Notes (Signed)
Patient called stating he would like to move forward with brachytherapy. I discussed the scheduling process, CT simulation and surgery with him and his wife. Ashlyn,PA and Enid Derry notified of his decision. He is aware Enid Derry will be contacting him to get the process started. I encouraged him to call me with questions or concerns. He voiced understanding.

## 2020-11-01 ENCOUNTER — Other Ambulatory Visit: Payer: Self-pay | Admitting: Rheumatology

## 2020-11-01 ENCOUNTER — Telehealth: Payer: Self-pay | Admitting: *Deleted

## 2020-11-01 NOTE — Telephone Encounter (Signed)
CALLED PATIENT TO ASK QUESTIONS, SPOKE WITH PATIENT 

## 2020-11-08 ENCOUNTER — Telehealth: Payer: Self-pay

## 2020-11-08 NOTE — Telephone Encounter (Signed)
I called patient, labs are due

## 2020-11-08 NOTE — Telephone Encounter (Signed)
Patient called requesting a return call to let him know when he is due for labwork.

## 2020-11-09 ENCOUNTER — Telehealth: Payer: Self-pay | Admitting: *Deleted

## 2020-11-09 NOTE — Telephone Encounter (Signed)
CALLED PATIENT TO UPDATE, SPOKE WITH PATIENT 

## 2020-11-13 ENCOUNTER — Other Ambulatory Visit: Payer: Self-pay | Admitting: *Deleted

## 2020-11-13 DIAGNOSIS — Z79899 Other long term (current) drug therapy: Secondary | ICD-10-CM

## 2020-11-14 ENCOUNTER — Other Ambulatory Visit: Payer: Self-pay | Admitting: Urology

## 2020-11-14 ENCOUNTER — Telehealth: Payer: Self-pay | Admitting: *Deleted

## 2020-11-14 DIAGNOSIS — C61 Malignant neoplasm of prostate: Secondary | ICD-10-CM

## 2020-11-14 LAB — CBC WITH DIFFERENTIAL/PLATELET
Absolute Monocytes: 607 cells/uL (ref 200–950)
Basophils Absolute: 20 cells/uL (ref 0–200)
Basophils Relative: 0.4 %
Eosinophils Absolute: 82 cells/uL (ref 15–500)
Eosinophils Relative: 1.6 %
HCT: 40 % (ref 38.5–50.0)
Hemoglobin: 13 g/dL — ABNORMAL LOW (ref 13.2–17.1)
Lymphs Abs: 1204 cells/uL (ref 850–3900)
MCH: 31.6 pg (ref 27.0–33.0)
MCHC: 32.5 g/dL (ref 32.0–36.0)
MCV: 97.1 fL (ref 80.0–100.0)
MPV: 9.8 fL (ref 7.5–12.5)
Monocytes Relative: 11.9 %
Neutro Abs: 3188 cells/uL (ref 1500–7800)
Neutrophils Relative %: 62.5 %
Platelets: 215 10*3/uL (ref 140–400)
RBC: 4.12 10*6/uL — ABNORMAL LOW (ref 4.20–5.80)
RDW: 13.1 % (ref 11.0–15.0)
Total Lymphocyte: 23.6 %
WBC: 5.1 10*3/uL (ref 3.8–10.8)

## 2020-11-14 LAB — COMPLETE METABOLIC PANEL WITH GFR
AG Ratio: 1.8 (calc) (ref 1.0–2.5)
ALT: 18 U/L (ref 9–46)
AST: 25 U/L (ref 10–35)
Albumin: 4.4 g/dL (ref 3.6–5.1)
Alkaline phosphatase (APISO): 58 U/L (ref 35–144)
BUN: 13 mg/dL (ref 7–25)
CO2: 27 mmol/L (ref 20–32)
Calcium: 9.5 mg/dL (ref 8.6–10.3)
Chloride: 106 mmol/L (ref 98–110)
Creat: 1.17 mg/dL (ref 0.70–1.18)
GFR, Est African American: 71 mL/min/{1.73_m2} (ref >=60)
GFR, Est Non African American: 61 mL/min/{1.73_m2} (ref >=60)
Globulin: 2.5 g/dL (calc) (ref 1.9–3.7)
Glucose, Bld: 94 mg/dL (ref 65–99)
Potassium: 4.8 mmol/L (ref 3.5–5.3)
Sodium: 141 mmol/L (ref 135–146)
Total Bilirubin: 1 mg/dL (ref 0.2–1.2)
Total Protein: 6.9 g/dL (ref 6.1–8.1)

## 2020-11-14 NOTE — Progress Notes (Signed)
CBC and CMP are stable.

## 2020-11-14 NOTE — Telephone Encounter (Signed)
Called patient to inform of implant date, spoke with patient

## 2020-11-29 NOTE — Progress Notes (Signed)
Office Visit Note  Patient: Eric [name]             Date of Birth: September 06, 1945           MRN: [id number]             PCP: Colon Branch, MD Referring: Colon Branch, MD Visit Date: 12/13/2020 Occupation: @GUAROCC @  Subjective:  Medication management   History of Present Illness: Eric [name] is a 75 y.o. male with history of rheumatoid arthritis.  He states his symptoms are well controlled with methotrexate 4 tablets p.o. weekly.  He continues to have some discomfort from underlying osteoarthritis in his hands knees and his feet.  He was diagnosed with prostate cancer in April 2022.  He will be getting seed implants in September for radiation therapy.  Activities of Daily Living:  Patient reports morning stiffness for 1 hour.   Patient Denies nocturnal pain.  Difficulty dressing/grooming: Denies Difficulty climbing stairs: Denies Difficulty getting out of chair: Denies Difficulty using hands for taps, buttons, cutlery, and/or writing: Reports  Review of Systems  Constitutional:  Positive for fatigue.  HENT:  Negative for mouth sores, mouth dryness and nose dryness.   Eyes:  Negative for pain, itching and dryness.  Respiratory:  Positive for shortness of breath. Negative for difficulty breathing.        With exertion   Cardiovascular:  Negative for chest pain and palpitations.  Gastrointestinal:  Negative for blood in stool, constipation and diarrhea.  Endocrine: Negative for increased urination.  Genitourinary:  Negative for difficulty urinating.  Musculoskeletal:  Positive for morning stiffness. Negative for joint pain, joint pain, joint swelling, myalgias, muscle tenderness and myalgias.  Skin:  Negative for color change, rash and redness.  Allergic/Immunologic: Negative for susceptible to infections.  Neurological:  Negative for dizziness, numbness, headaches, memory loss and weakness.  Hematological:  Positive for bruising/bleeding tendency.  Psychiatric/Behavioral:   Negative for confusion.    PMFS History:  Patient Active Problem List   Diagnosis Date Noted   Malignant neoplasm of prostate ([place]) 10/24/2020   Lumbar stenosis    Heart murmur    GERD (gastroesophageal reflux disease)    Dyspnea    Cataract    Arthritis    Anxiety    Body mass index (BMI) 30.0-30.9, adult 06/23/2019   Subdural hematoma ([place]) 05/05/2019   Subarachnoid hemorrhage ([place]) 04/28/2019   Rheumatoid arthritis ([place]) 11/17/2018   Spinal stenosis of lumbar region 04/23/2018   HNP (herniated nucleus pulposus), lumbar 04/23/2018   Prolapsed lumbar disc 03/03/2018   Wide-complex tachycardia ([place]) 12/30/2017   Left bundle branch block (LBBB) 12/18/2017   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 12/17/2017   Abnormal LFTs    Macular edema    PCP NOTES >>>>>> 04/27/2015   Hyperglycemia 10/20/2014   Dizziness 04/21/2014   Annual physical exam 04/15/2011   Aortic stenosis 04/15/2011   Hypothyroidism 10/13/2007   Hyperlipidemia 10/13/2007   Hypertension 04/08/2007    Past Medical History:  Diagnosis Date   Abnormal LFTs    Annual physical exam 04/15/2011   Anxiety    Aortic stenosis 04/15/2011   Arthritis    generalized   Cataract    sx 2018   Dizziness 04/21/2014   Dyspnea    GERD (gastroesophageal reflux disease)    with certain foods   Heart murmur    HNP (herniated nucleus pulposus), lumbar    L5-S1   Hyperglycemia 10/20/2014   Hyperlipidemia  on meds   Hypertension    on meds   Hypothyroidism    on meds   Left bundle branch block (LBBB) 12/18/2017   Lumbar stenosis    Macular edema    Dr  Sherlynn Stalls    PCP NOTES >>>>>> 04/27/2015   Prolapsed lumbar disc 03/03/2018   Prostate cancer ([place])    Rheumatoid arthritis ([place]) 11/17/2018   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 12/17/2017   Edwards Intuity Elite rapid deployment stented bovine pericardial tissue valve via right mini thoracotomy  approach   Spinal stenosis of lumbar region 04/23/2018   Subarachnoid hemorrhage ([place]) 04/28/2019   Wide-complex tachycardia ([place]) 12/30/2017    Family History  Problem Relation Age of Onset   Diabetes Mother    Stroke Mother    Diabetes Brother        ?   Coronary artery disease Brother        Male 1st degree relative >50, had CABG in his 15s   Emphysema Father    COPD Father    Diabetes Daughter    Colon cancer Neg Hx    Prostate cancer Neg Hx    Colon polyps Neg Hx    Esophageal cancer Neg Hx    Rectal cancer Neg Hx    Stomach cancer Neg Hx    Past Surgical History:  Procedure Laterality Date   AORTIC VALVE REPLACEMENT N/A 12/17/2017   Procedure: MINIMALLY INVASIVE AORTIC VALVE REPLACEMENT (AVR) [ using Edwards Intuity Valve Size 12mm;  Surgeon: Rexene Alberts, MD;  Location: [facility];  Service: Open Heart Surgery;  Laterality: N/A;  MINI THORACOTOMY   CARDIAC CATHETERIZATION  11/10/00   CATARACT EXTRACTION W/ INTRAOCULAR LENS IMPLANT     RIGHT EYE   CHOLECYSTECTOMY  2008   COLONOSCOPY  2017   TA   EYE SURGERY Right 2018   INGUINAL HERNIA REPAIR Bilateral 2008   LUMBAR LAMINECTOMY/DECOMPRESSION MICRODISCECTOMY N/A 04/23/2018   Procedure: Microlumbar decompression Lumbar five-Sacral one;  Surgeon: Susa Day, MD;  Location: [facility];  Service: Orthopedics;  Laterality: N/A;   POLYPECTOMY  2017   TA   PROSTATE BIOPSY  09/20/2020   RIGHT/LEFT HEART CATH AND CORONARY ANGIOGRAPHY N/A 10/16/2017   Procedure: RIGHT/LEFT HEART CATH AND CORONARY ANGIOGRAPHY;  Surgeon: [name], MD;  Location: [facility];  Service: Cardiovascular;  Laterality: N/A;   TEE WITHOUT CARDIOVERSION N/A 12/17/2017   Procedure: TRANSESOPHAGEAL ECHOCARDIOGRAM (TEE);  Surgeon: Rexene Alberts, MD;  Location: [facility];  Service: Open Heart Surgery;  Laterality: N/A;   TONSILLECTOMY  1960   Social History   Social History Narrative   Lives w/ wife   3 children, 8 g-kids              Immunization History  Administered Date(s) Administered   Fluad Quad(high Dose 65+) 02/12/2019, 03/14/2020   Influenza Split 03/11/2013, 02/17/2014   Influenza Whole 04/13/2007, 03/15/2008, 03/10/2012   Influenza, High Dose Seasonal PF 04/04/2015, 03/28/2016, 03/18/2017, 03/16/2018   PFIZER(Purple Top)SARS-COV-2 Vaccination 08/08/2019, 08/31/2019, 03/24/2020   Pneumococcal Conjugate-13 04/21/2014   Pneumococcal Polysaccharide-23 04/15/2012   Td 06/10/1996, 06/10/2000   Tdap 04/15/2011, 10/11/2018   Zoster, Live 03/29/2015     Objective: Vital Signs: BP 116/71 (BP Location: Left Arm, Patient Position: Sitting, Cuff Size: Normal)   Pulse (!) 58   Resp 15   Ht 5' 7.5" (1.715 m)   Wt 192 lb (87.1 kg)   BMI 29.63 kg/m    Physical  Exam Vitals and nursing note reviewed.  Constitutional:      Appearance: He is well-developed.  HENT:     Head: Normocephalic and atraumatic.  Eyes:     Conjunctiva/sclera: Conjunctivae normal.     Pupils: Pupils are equal, round, and reactive to light.  Cardiovascular:     Rate and Rhythm: Normal rate and regular rhythm.     Heart sounds: Normal heart sounds.  Pulmonary:     Effort: Pulmonary effort is normal.     Breath sounds: Normal breath sounds.  Abdominal:     General: Bowel sounds are normal.     Palpations: Abdomen is soft.  Musculoskeletal:     Cervical back: Normal range of motion and neck supple.  Skin:    General: Skin is warm and dry.     Capillary Refill: Capillary refill takes less than 2 seconds.  Neurological:     Mental Status: He is alert and oriented to person, place, and time.  Psychiatric:        Behavior: Behavior normal.     Musculoskeletal Exam: C-spine was in good range of motion.  Shoulder joints, elbow joints, wrist joints, MCPs PIPs and DIPs with good range of motion with no synovitis.  Hip joints, knee joints, ankles, MTPs and PIPs with good range of motion with no synovitis.  CDAI Exam: CDAI Score:  -- Patient Global: --; Provider Global: -- Swollen: --; Tender: -- Joint Exam 12/13/2020   No joint exam has been documented for this visit   There is currently no information documented on the homunculus. Go to the Rheumatology activity and complete the homunculus joint exam.  Investigation: No additional findings.  Imaging: No results found.  Recent Labs: Lab Results  Component Value Date   WBC 5.1 11/13/2020   HGB 13.0 (L) 11/13/2020   PLT 215 11/13/2020   NA 141 11/13/2020   K 4.8 11/13/2020   CL 106 11/13/2020   CO2 27 11/13/2020   GLUCOSE 94 11/13/2020   BUN 13 11/13/2020   CREATININE 1.17 11/13/2020   BILITOT 1.0 11/13/2020   ALKPHOS 68 05/21/2019   AST 25 11/13/2020   ALT 18 11/13/2020   PROT 6.9 11/13/2020   ALBUMIN 4.0 05/21/2019   CALCIUM 9.5 11/13/2020   GFRAA 71 11/13/2020   QFTBGOLDPLUS NEGATIVE 08/24/2018    Speciality Comments: No specialty comments available.  Procedures:  No procedures performed Allergies: Atorvastatin and Ezetimibe   Assessment / Plan:     Visit Diagnoses: Seronegative rheumatoid arthritis (HCC)-patient had no synovitis on my examination today.  His rheumatoid arthritis is well controlled on methotrexate.  High risk medication use - Methotrexate 4 tablets by mouth once weekly and folic acid 1 mg po daily.  Labs obtained on November 13, 2020 were normal.  We will continue to monitor labs every 3 months.  He has been advised to hold methotrexate in case he develops an infection.  Recommendations regarding immunization were also placed in the AVS.  Primary osteoarthritis of both hands-joint protection muscle strengthening was discussed.  Primary osteoarthritis of both knees-he has off-and-on discomfort in his knee joints.  Primary osteoarthritis of both feet-he is currently not having much discomfort in her feet.  Spinal stenosis of lumbar region, unspecified whether neurogenic claudication present-he has chronic lower back  pain.  Osteopenia of multiple sites - DEXA updated on 04/23/2019 right femoral neck BMD 0.776 with T score -1.1.   History of anxiety  Essential hypertension-blood pressure was normal today.  S/P minimally  invasive aortic valve replacement with bioprosthetic valve  Left bundle branch block (LBBB)  History of hypothyroidism  Family history of psoriasis  Malignant neoplasm of the prostate-patient was diagnosed with prostate cancer in April 2022.  He will be getting seed implants in September.  I advised him to stop methotrexate when he gets radiation therapy.  Orders: No orders of the defined types were placed in this encounter.  No orders of the defined types were placed in this encounter.    Follow-Up Instructions: Return for Rheumatoid arthritis.   [name], MD  Note - This record has been created using Editor, commissioning.  Chart creation errors have been sought, but may not always  have been located. Such creation errors do not reflect on  the standard of medical care.

## 2020-12-13 ENCOUNTER — Other Ambulatory Visit: Payer: Self-pay

## 2020-12-13 ENCOUNTER — Encounter: Payer: Self-pay | Admitting: Rheumatology

## 2020-12-13 ENCOUNTER — Ambulatory Visit: Payer: Medicare Other | Admitting: Rheumatology

## 2020-12-13 VITALS — BP 116/71 | HR 58 | Resp 15 | Ht 67.5 in | Wt 192.0 lb

## 2020-12-13 DIAGNOSIS — M170 Bilateral primary osteoarthritis of knee: Secondary | ICD-10-CM

## 2020-12-13 DIAGNOSIS — M0600 Rheumatoid arthritis without rheumatoid factor, unspecified site: Secondary | ICD-10-CM | POA: Diagnosis not present

## 2020-12-13 DIAGNOSIS — Z79899 Other long term (current) drug therapy: Secondary | ICD-10-CM | POA: Diagnosis not present

## 2020-12-13 DIAGNOSIS — Z8659 Personal history of other mental and behavioral disorders: Secondary | ICD-10-CM

## 2020-12-13 DIAGNOSIS — M19041 Primary osteoarthritis, right hand: Secondary | ICD-10-CM | POA: Diagnosis not present

## 2020-12-13 DIAGNOSIS — Z840 Family history of diseases of the skin and subcutaneous tissue: Secondary | ICD-10-CM

## 2020-12-13 DIAGNOSIS — M8589 Other specified disorders of bone density and structure, multiple sites: Secondary | ICD-10-CM

## 2020-12-13 DIAGNOSIS — M19071 Primary osteoarthritis, right ankle and foot: Secondary | ICD-10-CM

## 2020-12-13 DIAGNOSIS — C61 Malignant neoplasm of prostate: Secondary | ICD-10-CM

## 2020-12-13 DIAGNOSIS — Z953 Presence of xenogenic heart valve: Secondary | ICD-10-CM

## 2020-12-13 DIAGNOSIS — I447 Left bundle-branch block, unspecified: Secondary | ICD-10-CM

## 2020-12-13 DIAGNOSIS — M48061 Spinal stenosis, lumbar region without neurogenic claudication: Secondary | ICD-10-CM

## 2020-12-13 DIAGNOSIS — M19042 Primary osteoarthritis, left hand: Secondary | ICD-10-CM

## 2020-12-13 DIAGNOSIS — I10 Essential (primary) hypertension: Secondary | ICD-10-CM

## 2020-12-13 DIAGNOSIS — Z8639 Personal history of other endocrine, nutritional and metabolic disease: Secondary | ICD-10-CM

## 2020-12-13 DIAGNOSIS — M19072 Primary osteoarthritis, left ankle and foot: Secondary | ICD-10-CM

## 2020-12-13 NOTE — Patient Instructions (Addendum)
Standing Labs We placed an order today for your standing lab work.   Please have your standing labs drawn (CBC with diff, CMP with GFR) in September and every 3 months  If possible, please have your labs drawn 2 weeks prior to your appointment so that the provider can discuss your results at your appointment.  Please note that you may see your imaging and lab results in [facility] before we have reviewed them. We may be awaiting multiple results to interpret others before contacting you. Please allow our office up to 72 hours to thoroughly review all of the results before contacting the office for clarification of your results.  We have open lab daily: Monday through Thursday from 1:30-4:30 PM and Friday from 1:30-4:00 PM at the office of Dr. [name], [facility].   Please be advised, all patients with office appointments requiring lab work will take precedent over walk-in lab work.  If possible, please come for your lab work on Monday and Friday afternoons, as you may experience shorter wait times. The office is located at [address], [place], [place], [place] [address] No appointment is necessary.   Labs are drawn by [facility]. Please bring your co-pay at the time of your lab draw.  You may receive a bill from [facility] for your lab work.  If you wish to have your labs drawn at another location, please call the office 24 hours in advance to send orders.  If you have any questions regarding directions or hours of operation,  please call [phone].   As a reminder, please drink plenty of water prior to coming for your lab work. Thanks!   Vaccines You are taking a medication(s) that can suppress your immune system.  The following immunizations are recommended: Flu annually Covid-19  Td/Tdap (tetanus, diphtheria, pertussis) every 10 years Pneumonia (Prevnar 15 then Pneumovax 23 at least 1 year apart.  Alternatively, can take Prevnar 20 without needing additional  dose) Shingrix (after age 20): 2 doses from 4 weeks to 6 months apart  Please check with your PCP to make sure you are up to date.  If you test POSITIVE for COVID19 and have MILD to MODERATE symptoms: First, call your PCP if you would like to receive COVID19 treatment AND Hold your medications during the infection and for at least 1 week after your symptoms have resolved: Injectable medication (Benlysta, Cimzia, Cosentyx, Enbrel, Humira, Orencia, Remicade, Simponi, Stelara, Taltz, Tremfya) Methotrexate Leflunomide (Arava) Mycophenolate (Cellcept) Morrie Sheldon, Olumiant, or [name] If you take Actemra or Kevzara, you DO NOT need to hold these for COVID19 infection.  If you test POSITIVE for COVID19 and have NO symptoms: First, call your PCP if you would like to receive COVID19 treatment AND Hold your medications for at least 10 days after the day that you tested positive Injectable medication (Benlysta, Cimzia, Cosentyx, Enbrel, Humira, Orencia, Remicade, Simponi, Stelara, Taltz, Tremfya) Methotrexate Leflunomide (Arava) Mycophenolate (Cellcept) Morrie Sheldon, Olumiant, or [name] If you take Actemra or Kevzara, you DO NOT need to hold these for COVID19 infection.  If you have signs or symptoms of an infection or start antibiotics: First, call your PCP for workup of your infection. Hold your medication through the infection, until you complete your antibiotics, and until symptoms resolve if you take the following: Injectable medication (Actemra, Benlysta, Cimzia, Cosentyx, Enbrel, Humira, Kevzara, Orencia, Remicade, Simponi, Stelara, Taltz, Tremfya) Methotrexate Leflunomide (Arava) Mycophenolate (Cellcept) Roma Kayser, or [name]  Heart Disease Prevention   Your inflammatory disease increases your risk  of heart disease which includes heart attack, stroke, atrial fibrillation (irregular heartbeats), high blood pressure, heart failure and atherosclerosis (plaque in the arteries).  It is  important to reduce your risk by:   Keep blood pressure, cholesterol, and blood sugar at healthy levels   Smoking Cessation   Maintain a healthy weight  BMI 20-25   Eat a healthy diet  Plenty of fresh fruit, vegetables, and whole grains  Limit saturated fats, foods high in sodium, and added sugars  DASH and Mediterranean diet   Increase physical activity  Recommend moderate physically activity for 150 minutes per week/ 30 minutes a day for five days a week These can be broken up into three separate ten-minute sessions during the day.   Reduce Stress  Meditation, slow breathing exercises, yoga, coloring books  Dental visits twice a year

## 2020-12-21 ENCOUNTER — Other Ambulatory Visit: Payer: Self-pay

## 2020-12-21 ENCOUNTER — Encounter: Payer: Self-pay | Admitting: Internal Medicine

## 2020-12-21 ENCOUNTER — Ambulatory Visit: Payer: Medicare Other | Admitting: Internal Medicine

## 2020-12-21 VITALS — BP 126/84 | HR 59 | Temp 98.4°F | Resp 16 | Ht 68.0 in | Wt 189.0 lb

## 2020-12-21 DIAGNOSIS — I10 Essential (primary) hypertension: Secondary | ICD-10-CM

## 2020-12-21 DIAGNOSIS — E038 Other specified hypothyroidism: Secondary | ICD-10-CM

## 2020-12-21 LAB — TSH: TSH: 1.31 u[IU]/mL (ref 0.35–5.50)

## 2020-12-21 NOTE — Progress Notes (Signed)
Subjective:    Patient ID: Eric [name], male    DOB: 1946/02/15, 75 y.o.   MRN: [id number]  DOS:  12/21/2020 Type of visit - description: ROV  Since the last office visit, he was diagnosed with prostate cancer. Doing well emotionally. Note from rheumatology reviewed   Review of Systems Denies chest pain or difficulty breathing. No nausea or vomiting  Past Medical History:  Diagnosis Date   Abnormal LFTs    Annual physical exam 04/15/2011   Anxiety    Aortic stenosis 04/15/2011   Arthritis    generalized   Cataract    sx 2018   Dizziness 04/21/2014   Dyspnea    GERD (gastroesophageal reflux disease)    with certain foods   Heart murmur    HNP (herniated nucleus pulposus), lumbar    L5-S1   Hyperglycemia 10/20/2014   Hyperlipidemia    on meds   Hypertension    on meds   Hypothyroidism    on meds   Left bundle branch block (LBBB) 12/18/2017   Lumbar stenosis    Macular edema    Dr  Sherlynn Stalls    PCP NOTES >>>>>> 04/27/2015   Prolapsed lumbar disc 03/03/2018   Prostate cancer ([place])    Rheumatoid arthritis ([place]) 11/17/2018   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) 04/28/2019   Wide-complex tachycardia ([place]) 12/30/2017    Past Surgical History:  Procedure Laterality Date   AORTIC VALVE REPLACEMENT N/A 12/17/2017   Procedure: MINIMALLY INVASIVE AORTIC VALVE REPLACEMENT (AVR) [ using Edwards Intuity Valve Size 66mm;  Surgeon: Rexene Alberts, MD;  Location: [facility];  Service: Open Heart Surgery;  Laterality: N/A;  MINI THORACOTOMY   CARDIAC CATHETERIZATION  11/10/00   CATARACT EXTRACTION W/ INTRAOCULAR LENS IMPLANT     RIGHT EYE   CHOLECYSTECTOMY  2008   COLONOSCOPY  2017   TA   EYE SURGERY Right 2018   INGUINAL HERNIA REPAIR  Bilateral 2008   LUMBAR LAMINECTOMY/DECOMPRESSION MICRODISCECTOMY N/A 04/23/2018   Procedure: Microlumbar decompression Lumbar five-Sacral one;  Surgeon: Susa Day, MD;  Location: [facility];  Service: Orthopedics;  Laterality: N/A;   POLYPECTOMY  2017   TA   PROSTATE BIOPSY  09/20/2020   RIGHT/LEFT HEART CATH AND CORONARY ANGIOGRAPHY N/A 10/16/2017   Procedure: RIGHT/LEFT HEART CATH AND CORONARY ANGIOGRAPHY;  Surgeon: [name], MD;  Location: [facility];  Service: Cardiovascular;  Laterality: N/A;   TEE WITHOUT CARDIOVERSION N/A 12/17/2017   Procedure: TRANSESOPHAGEAL ECHOCARDIOGRAM (TEE);  Surgeon: Rexene Alberts, MD;  Location: [facility];  Service: Open Heart Surgery;  Laterality: N/A;   TONSILLECTOMY  1960    Allergies as of 12/21/2020       Reactions   Atorvastatin Other (See Comments)   REACTION: myalgia   Ezetimibe Other (See Comments)   REACTION: myalgia        Medication List        Accurate as of December 21, 2020 11:59 PM. If you have any questions, ask your nurse or doctor.          acetaminophen 325 MG tablet Commonly known as: TYLENOL Take 2 tablets (650 mg total) by mouth every 6 (six) hours as needed for mild pain, moderate pain, fever or headache.   amLODipine 5 MG tablet Commonly  known as: NORVASC TAKE 1 TABLET BY MOUTH EVERY DAY   aspirin EC 81 MG tablet Take 81 mg by mouth daily.   carvedilol 12.5 MG tablet Commonly known as: COREG TAKE 1 TABLET (12.5 MG TOTAL) BY MOUTH 2 (TWO) TIMES DAILY WITH A MEAL.   folic acid 1 MG tablet Commonly known as: FOLVITE TAKE 1 TABLET BY MOUTH EVERY DAY   levothyroxine 88 MCG tablet Commonly known as: SYNTHROID Take 1 tablet (88 mcg total) by mouth daily before breakfast.   methotrexate 2.5 MG tablet Commonly known as: RHEUMATREX TAKE 4 TABLETS (10 MG TOTAL) BY MOUTH ONCE A WEEK.   rosuvastatin 10 MG tablet Commonly known as: Crestor Take 1 tablet (10 mg total) by mouth at bedtime.    spironolactone 25 MG tablet Commonly known as: ALDACTONE Take 1 tablet (25 mg total) by mouth daily.   VITAMIN D-3 PO Take 2,000 Units by mouth daily.           Objective:   Physical Exam BP 126/84 (BP Location: Left Arm, Patient Position: Sitting, Cuff Size: Small)   Pulse (!) 59   Temp 98.4 F (36.9 C) (Oral)   Resp 16   Ht 5\' 8"  (1.727 m)   Wt 189 lb (85.7 kg)   SpO2 97%   BMI 28.74 kg/m  General:   Well developed, NAD, BMI noted. HEENT:  Normocephalic . Face symmetric, atraumatic Lungs:  CTA B Normal respiratory effort, no intercostal retractions, no accessory muscle use. Heart: RRR,  no murmur.  Lower extremities: no pretibial edema bilaterally  Skin: Not pale. Not jaundice Neurologic:  alert & oriented X3.  Speech normal, gait appropriate for age and unassisted Psych--  Cognition and judgment appear intact.  Cooperative with normal attention span and concentration.  Behavior appropriate. No anxious or depressed appearing.      Assessment    Assessment HTN Hyperlipidemia Hypothyroidism R.A. dx 2020 DJD Increase LFTs Ultrasound 2002 done for increased LFTs: Normal liver; Hep C negative 02-16-15 CV: Aortic stenosis , dx  2012 , s/p AoVR 12/2017 Fusiform infrarenal abdominal aortic ectasis: Pt's Brother has CAD dx age 44s --Patient himself has a cardiac catheterization with minimal to no disease 10/2017 Macular degeneration, cataracts  Subarachnoid hemorrhage, left orbital contusion, left occipital fracture 2020 Prostate cancer, Dx 09-2020 COVID-19 infection 04/2019  PLAN HTN: Seems controlled, last BMP satisfactory, continue amlodipine, carvedilol, Aldactone. Hypothyroidism: On Synthroid, check TSH. Rheumatoid arthritis: Saw rheumatology 12/13/2020, noted to be doing well on MTX. Prostate cancer:Dx/2022, to have seed implants soon. Preventive care: Rec COVID-19 #4, flu shot this season. RTC 6 months  This visit occurred during the SARS-CoV-2  public health emergency.  Safety protocols were in place, including screening questions prior to the visit, additional usage of staff PPE, and extensive cleaning of exam room while observing appropriate contact time as indicated for disinfecting solutions.

## 2020-12-21 NOTE — Patient Instructions (Signed)
   GO TO THE LAB : Get the blood work     GO TO THE FRONT DESK, PLEASE SCHEDULE YOUR APPOINTMENTS Come back for a physical exam in 6 months 

## 2020-12-22 NOTE — Assessment & Plan Note (Signed)
HTN: Seems controlled, last BMP satisfactory, continue amlodipine, carvedilol, Aldactone. Hypothyroidism: On Synthroid, check TSH. Rheumatoid arthritis: Saw rheumatology 12/13/2020, noted to be doing well on MTX. Prostate cancer:Dx/2022, to have seed implants soon. Preventive care: Rec COVID-19 #4, flu shot this season. RTC 6 months

## 2020-12-26 NOTE — Progress Notes (Signed)
  Radiation Oncology         [phone]) [phone] ________________________________  Name: Eric [name] MRN: [id number]  Date: 12/28/2020  DOB: 1945-12-14  SIMULATION AND TREATMENT PLANNING NOTE PUBIC ARCH STUDY  CC:Paz, Alda Berthold, MD  Irine Seal, MD  DIAGNOSIS:   75 y.o. gentleman with Stage T2a adenocarcinoma of the prostate with Gleason score of 3+4, and PSA of 3.76  Oncology History  Malignant neoplasm of prostate ([place])  09/20/2020 Cancer Staging   Staging form: Prostate, AJCC 8th Edition - Clinical stage from 09/20/2020: Stage IIB (cT2a, cN0, cM0, PSA: 3.8, Grade Group: 2) - Signed by Freeman Caldron, PA-C on 10/24/2020  Histopathologic type: Adenocarcinoma, NOS  Prostate specific antigen (PSA) range: Less than 10  Gleason primary pattern: 3  Gleason secondary pattern: 4  Gleason score: 7  Histologic grading system: 5 grade system  Number of biopsy cores examined: 12  Number of biopsy cores positive: 3  Location of positive needle core biopsies: One side    10/24/2020 Initial Diagnosis   Malignant neoplasm of prostate ([place])        ICD-10-CM   1. Malignant neoplasm of prostate ([place])  C61       COMPLEX SIMULATION:  The patient presented today for evaluation for possible prostate seed implant. He was brought to the radiation planning suite and placed supine on the CT couch. A 3-dimensional image study set was obtained in upload to the planning computer. There, on each axial slice, I contoured the prostate gland. Then, using three-dimensional radiation planning tools I reconstructed the prostate in view of the structures from the transperineal needle pathway to assess for possible pubic arch interference. In doing so, I did not appreciate any pubic arch interference. Also, the patient's prostate volume was estimated based on the drawn structure. The volume was 26 cc.  TRUS volume was 27 cc.  Given the pubic arch appearance and prostate volume, patient remains a good candidate to  proceed with prostate seed implant. Today, he freely provided informed written consent to proceed.    PLAN: The patient will undergo prostate seed implant.   ________________________________  Sheral Apley. Tammi Klippel, M.D.

## 2020-12-27 ENCOUNTER — Telehealth: Payer: Self-pay | Admitting: *Deleted

## 2020-12-27 NOTE — Telephone Encounter (Signed)
CALLED PATIENT TO REMIND OF PRE-SEED APPTS. FOR 12-28-20, SPOKE WITH PATIENT AND HE IS AWARE OF THESE APPTS.

## 2020-12-28 ENCOUNTER — Other Ambulatory Visit: Payer: Self-pay

## 2020-12-28 ENCOUNTER — Ambulatory Visit (HOSPITAL_COMMUNITY)
Admission: RE | Admit: 2020-12-28 | Discharge: 2020-12-28 | Disposition: A | Discharge status: Home / Self Care | Payer: Medicare Other | Source: Ambulatory Visit | Attending: Urology | Admitting: Urology

## 2020-12-28 ENCOUNTER — Ambulatory Visit
Admission: RE | Admit: 2020-12-28 | Discharge: 2020-12-28 | Disposition: A | Discharge status: Home / Self Care | Payer: Medicare Other | Source: Ambulatory Visit | Attending: Urology | Admitting: Urology

## 2020-12-28 ENCOUNTER — Encounter (HOSPITAL_COMMUNITY)
Admission: RE | Admit: 2020-12-28 | Discharge: 2020-12-28 | Disposition: A | Discharge status: Home / Self Care | Payer: Medicare Other | Source: Ambulatory Visit | Attending: Urology | Admitting: Urology

## 2020-12-28 ENCOUNTER — Encounter: Payer: Self-pay | Admitting: Urology

## 2020-12-28 ENCOUNTER — Ambulatory Visit
Admission: RE | Admit: 2020-12-28 | Discharge: 2020-12-28 | Disposition: A | Discharge status: Home / Self Care | Payer: Medicare Other | Source: Ambulatory Visit | Attending: Radiation Oncology | Admitting: Radiation Oncology

## 2020-12-28 DIAGNOSIS — C61 Malignant neoplasm of prostate: Secondary | ICD-10-CM

## 2020-12-28 NOTE — Progress Notes (Signed)
Patient in for pre seed appointment. Doing well has no pain at this time. Questions and concerned addressed.

## 2021-01-28 ENCOUNTER — Other Ambulatory Visit: Payer: Self-pay | Admitting: Rheumatology

## 2021-01-29 ENCOUNTER — Telehealth: Payer: Self-pay | Admitting: *Deleted

## 2021-01-29 NOTE — Telephone Encounter (Signed)
CALLED PATIENT TO REMIND OF LAB APPT. FOR 02-06-21, SPOKE WITH PATIENT AND HE IS AWARE OF THIS APPT.

## 2021-01-29 NOTE — Telephone Encounter (Signed)
Next Visit: 05/17/2021  Last Visit: 12/13/2020  Last Fill: 10/30/2020  DX: Seronegative rheumatoid arthritis   Current Dose per office note 12/13/2020: Methotrexate 4 tablets by mouth once weekly   Labs: 11/13/2020 CBC and CMP are stable.  Okay to refill MTX?

## 2021-02-06 ENCOUNTER — Encounter (HOSPITAL_COMMUNITY)
Admission: RE | Admit: 2021-02-06 | Discharge: 2021-02-06 | Disposition: A | Discharge status: Home / Self Care | Payer: Medicare Other | Source: Ambulatory Visit | Attending: Urology | Admitting: Urology

## 2021-02-06 ENCOUNTER — Encounter (HOSPITAL_BASED_OUTPATIENT_CLINIC_OR_DEPARTMENT_OTHER): Payer: Self-pay | Admitting: Urology

## 2021-02-06 ENCOUNTER — Other Ambulatory Visit: Payer: Self-pay

## 2021-02-06 ENCOUNTER — Other Ambulatory Visit: Payer: Self-pay | Admitting: Internal Medicine

## 2021-02-06 DIAGNOSIS — Z01812 Encounter for preprocedural laboratory examination: Secondary | ICD-10-CM | POA: Insufficient documentation

## 2021-02-06 LAB — CBC
HCT: 40.4 % (ref 39.0–52.0)
Hemoglobin: 13.5 g/dL (ref 13.0–17.0)
MCH: 32.5 pg (ref 26.0–34.0)
MCHC: 33.4 g/dL (ref 30.0–36.0)
MCV: 97.1 fL (ref 80.0–100.0)
Platelets: 225 10*3/uL (ref 150–400)
RBC: 4.16 MIL/uL — ABNORMAL LOW (ref 4.22–5.81)
RDW: 13.2 % (ref 11.5–15.5)
WBC: 5.5 10*3/uL (ref 4.0–10.5)
nRBC: 0 % (ref 0.0–0.2)

## 2021-02-06 LAB — COMPREHENSIVE METABOLIC PANEL
ALT: 22 U/L (ref 0–44)
AST: 29 U/L (ref 15–41)
Albumin: 4.5 g/dL (ref 3.5–5.0)
Alkaline Phosphatase: 58 U/L (ref 38–126)
Anion gap: 7 (ref 5–15)
BUN: 15 mg/dL (ref 8–23)
CO2: 23 mmol/L (ref 22–32)
Calcium: 9.7 mg/dL (ref 8.9–10.3)
Chloride: 108 mmol/L (ref 98–111)
Creatinine, Ser: 1.17 mg/dL (ref 0.61–1.24)
GFR, Estimated: >60 mL/min (ref >60)
Glucose, Bld: 107 mg/dL — ABNORMAL HIGH (ref 70–99)
Potassium: 4.2 mmol/L (ref 3.5–5.1)
Sodium: 138 mmol/L (ref 135–145)
Total Bilirubin: 1.3 mg/dL — ABNORMAL HIGH (ref 0.3–1.2)
Total Protein: 7.6 g/dL (ref 6.5–8.1)

## 2021-02-06 LAB — PROTIME-INR
INR: 1 (ref 0.8–1.2)
Prothrombin Time: 13.3 seconds (ref 11.4–15.2)

## 2021-02-06 LAB — APTT: aPTT: 30 seconds (ref 24–36)

## 2021-02-07 ENCOUNTER — Other Ambulatory Visit: Payer: Self-pay

## 2021-02-07 ENCOUNTER — Encounter (HOSPITAL_BASED_OUTPATIENT_CLINIC_OR_DEPARTMENT_OTHER): Payer: Self-pay | Admitting: Urology

## 2021-02-07 NOTE — Progress Notes (Signed)
Spoke w/ via phone for pre-op interview---pt Lab needs dos----  no             Lab results------ pt had labs done 02-06-2021, CBC/ CMP/ PT/ PTT, results in epic;  current ekg/ cxr in epic/ chart COVID test -----patient states asymptomatic no test needed Arrive at -------  1015 on 02-09-2021 NPO after MN NO Solid Food.  Clear liquids from MN until--- 0915 Med rec completed Medications to take morning of surgery ----- Coreg, Norvasc, Syntrhoid Diabetic medication ----- n/a Patient instructed no nail polish to be worn day of surgery Patient instructed to bring photo id and insurance card day of surgery Patient aware to have Driver (ride ) / caregiver   for 24 hours after surgery --wife, Eric [name] Patient Special Instructions ----- will do one fleet enema morning of surgery Pre-Op special Istructions ----- n/a Patient verbalized understanding of instructions that were given at this phone interview. Patient denies shortness of breath, chest pain, fever, cough at this phone interview.    Anesthesia Review: HTN;  hx severe AV stenosis s/p AVR 07/ 2019; known LBBB since postop AVRhx SAH from syncope with collapse 11/ 2020 ;  RA/ OA;   Pt denies any cardiac s&s, no peripheral swelling, and only occasional sob with yard work no sob with stairs.  PCP:  Dr Larose Kells (lov 12-22-2020 epic) Cardiologist : Dr Bettina Gavia (lov 03-29-2020 epic) Chest x-ray : 12-28-2020 epic EKG : 03-29-2020 epic Echo : 04-28-2019 epic Stress test: no Cardiac Cath :  10-16-2017 epic Activity level: see above Sleep Study/ CPAP : no Blood Thinner/ Instructions Maryjane Hurter Dose: no ASA / Instructions/ Last Dose :  ASA '81mg'$ /  per pt told by dr Jeffie Pollock to continue asa, do not stop prior to surgery

## 2021-02-08 ENCOUNTER — Telehealth: Payer: Self-pay | Admitting: *Deleted

## 2021-02-08 NOTE — H&P (Signed)
I have prostate cancer.     Eric [name] returns today in f/u to discuss his prostate biopsy done for a 36m left apical nodule and a PSA of 3.76. He was found to have a 278mprostate with a T2a Nx Mx Gleason 7(3+4) cancer in the area or the nodule. He had 3 cores involved, with 40% GG2 in the left mid lateral core, 5% GG1 in the left apical lateral core and 50% GG1 in the left apical medial core. His IPSS is 7 and his SHIM is 3.   [facility] nomogram: 62% OCD, 36% ECE, 3% SVI and 3% LNI. 88% PFS at 5 yrs and 79% at 10 yrs.   01/29/2021  Patient has elected to proceed with brachytherapy with spaceoar. He is scheduled for the procedure with Dr. WrJeffie PollockHe has a history of aortic valve.    ALLERGIES: Atorvastatin Calcium TABS ezetimibe    MEDICATIONS: Aspirin 81 mg tablet,chewable  Acetaminophen  Amlodipine Besylate 5 mg tablet  Carvedilol  Diazepam 10 mg tablet 1-2 tablet PO Daily Take one hour prior to procedure.  Folic Acid  Levofloxacin 750 mg tablet 1 po 1 hour prior to the procedure  Levothyroxine  Methotrexate  Rosuvastatin Calcium  Spironolactone  Vitamin D3     GU PSH: Prostate Needle Biopsy - 09/20/2020     NON-GU PSH: Aortic valve replacement (tissue) - 2019 Back surgery Cholecystectomy (laparoscopic) Colonoscopy & Polypectomy Eye Surgery (Unspecified) Hernia Repair Surgical Pathology, Gross And Microscopic Examination For Prostate Needle - 09/20/2020 Tonsillectomy     GU PMH: Elevated PSA, His PSA has been rising slowly for years with an increase in the rate since 2019. - 10/11/2020, - 09/20/2020, - 07/26/2020 Nocturia - 10/11/2020 Prostate Cancer, He has a 2788mrostate with a T2a Nx Mx GG2 favorable risk prostate cancer. I discussed options including surveillance, RALP, EXRT, Seeds, Cryo and HIFU. With his grade and stage, prostate volume and mild LUTS, I think he is a very good candidate for a seed implant which I have recommended he consider if he would like active therapy. I  reviewed the risks and benefits of the various procedures in detail. At his age with his comorbidities, a period of active surveillance wouldn't be unreasonable either. I am going to get him set up for a consultation with Dr. ManTammi Klippelt will also set up a 3 month f/u with a PSA should he decide on surveillance. - 10/11/2020 Prostate nodule w/ LUTS, He has mild/mod LUTS with nocturia x 2. - 10/11/2020, - 09/20/2020, He has a small left apical nodule and a rising PSA. I will get him scheduled for a prostate US Koread biopsy and reviewed the risks of bleeding, infection and voiding difficulty. , - 07/26/2020    NON-GU PMH: Bacteriuria, He has bacteriuria without pyuria. Since we are going to do a biopsy, I will get a culture on him. - 07/26/2020 Anxiety Arthritis Cardiac murmur, unspecified Hypercholesterolemia Hypertension    FAMILY HISTORY: 2 daughters - Other 1 son - Other copd - Father Diabetes - Mother stroke - Mother   SOCIAL HISTORY: Marital Status: Married Preferred Language: English; Race: White Current Smoking Status: Patient does not smoke anymore. Has not smoked since 07/11/2000.   Tobacco Use Assessment Completed: Used Tobacco in last 30 days? Drinks 1 caffeinated drink per day. 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 Patient denies hard to  postpone urination, burning/ pain with urination, leakage of urine, stream starts and stops, trouble starting your stream, have to strain to urinate , erection problems, and penile pain.  Gastrointestinal (Upper):   Patient denies nausea, vomiting, and indigestion/ heartburn.  Gastrointestinal (Lower):   Patient denies diarrhea and constipation.  Constitutional:   Patient denies fever, night sweats, weight loss, and fatigue.  Skin:   Patient denies skin rash/ lesion and itching.  Eyes:   Patient denies blurred vision and double vision.   Ears/ Nose/ Throat:   Patient denies sore throat and sinus problems.  Hematologic/Lymphatic:   Patient denies swollen glands and easy bruising.  Cardiovascular:   Patient denies leg swelling and chest pains.  Respiratory:   Patient reports shortness of breath. Patient denies cough.  Endocrine:   Patient denies excessive thirst.  Musculoskeletal:   Patient denies joint pain and back pain.  Neurological:   Patient denies headaches and dizziness.  Psychologic:   Patient denies depression and anxiety.   VITAL SIGNS:      01/29/2021 02:16 PM  BP 121/68 mmHg  Pulse 69 /min  Temperature 98.0 F / 36.6 C   MULTI-SYSTEM PHYSICAL EXAMINATION:    Constitutional: Well-nourished. No physical deformities. Normally developed. Good grooming.  Respiratory: No labored breathing, no use of accessory muscles.   Cardiovascular: Normal temperature, normal extremity pulses, no swelling, no varicosities.  Skin: No paleness, no jaundice, no cyanosis. No lesion, no ulcer, no rash.  Neurologic / Psychiatric: Oriented to time, oriented to place, oriented to person. No depression, no anxiety, no agitation.  Gastrointestinal: No mass, no tenderness, no rigidity, non obese abdomen.  Eyes: Normal conjunctivae. Normal eyelids.  Musculoskeletal: Normal gait and station of head and neck.     Complexity of Data:  Source Of History:  Patient  Records Review:   Previous Doctor Records, Previous Patient Records  Urine Test Review:   Urinalysis   06/23/20 02/25/18 04/26/16 04/19/13 04/15/11  PSA  Total PSA 3.76 ng/ml 2.32 ng/ml 2.21 ng/ml 1.41 ng/ml 1.34 ng/ml    PROCEDURES:          Urinalysis w/Scope Dipstick Dipstick Cont'd Micro  Color: Amber Bilirubin: Neg mg/dL WBC/hpf: NS (Not Seen)  Appearance: Clear Ketones: Neg mg/dL RBC/hpf: 0 - 2/hpf  Specific Gravity: 1.025 Blood: 1+ ery/uL Bacteria: NS (Not Seen)  pH: 5.5 Protein: Neg mg/dL Cystals: NS (Not Seen)  Glucose: Neg mg/dL Urobilinogen: 0.2 mg/dL Casts:  Hyaline    Nitrites: Neg Trichomonas: Not Present    Leukocyte Esterase: Neg leu/uL Mucous: Present      Epithelial Cells: 0 - 5/hpf      Yeast: NS (Not Seen)      Sperm: Not Present    ASSESSMENT:      ICD-10 Details  1 GU:   Prostate Cancer - C61 Chronic, Stable   PLAN:           Orders Labs Urine Culture          Document Letter(s):  Created for Patient: Clinical Summary         Notes:   Proceed with brachytherapy with spaceoar. Risks and benefits discussed. He can stay on his 81 mg aspirin given his history of aortic valve replacement.   Cc: Dr. Larose Kells  Dr Jeffie Pollock        Next Appointment:      Next Appointment: 02/09/2021 12:00 PM    Appointment Type: Surgery     Location: [facility] [phone]  Provider: Irine Seal, M.D.    Reason for Visit: [place]

## 2021-02-08 NOTE — Telephone Encounter (Signed)
Called patient to remind of procedure for 02-09-21, spoke with patient and he is aware of this procedure

## 2021-02-09 ENCOUNTER — Ambulatory Visit (HOSPITAL_COMMUNITY): Payer: Medicare Other

## 2021-02-09 ENCOUNTER — Other Ambulatory Visit: Payer: Self-pay

## 2021-02-09 ENCOUNTER — Ambulatory Visit (HOSPITAL_BASED_OUTPATIENT_CLINIC_OR_DEPARTMENT_OTHER): Payer: Medicare Other | Admitting: Certified Registered Nurse Anesthetist

## 2021-02-09 ENCOUNTER — Encounter (HOSPITAL_BASED_OUTPATIENT_CLINIC_OR_DEPARTMENT_OTHER)
Admission: RE | Disposition: A | Discharge status: Home / Self Care | Payer: Self-pay | Source: Home / Self Care | Attending: Urology

## 2021-02-09 ENCOUNTER — Ambulatory Visit (HOSPITAL_BASED_OUTPATIENT_CLINIC_OR_DEPARTMENT_OTHER)
Admission: RE | Admit: 2021-02-09 | Discharge: 2021-02-09 | Disposition: A | Discharge status: Home / Self Care | Payer: Medicare Other | Attending: Urology | Admitting: Urology

## 2021-02-09 ENCOUNTER — Encounter (HOSPITAL_BASED_OUTPATIENT_CLINIC_OR_DEPARTMENT_OTHER): Payer: Self-pay | Admitting: Urology

## 2021-02-09 DIAGNOSIS — Z952 Presence of prosthetic heart valve: Secondary | ICD-10-CM | POA: Insufficient documentation

## 2021-02-09 DIAGNOSIS — C61 Malignant neoplasm of prostate: Secondary | ICD-10-CM | POA: Diagnosis present

## 2021-02-09 DIAGNOSIS — R351 Nocturia: Secondary | ICD-10-CM | POA: Diagnosis not present

## 2021-02-09 DIAGNOSIS — Z8619 Personal history of other infectious and parasitic diseases: Secondary | ICD-10-CM | POA: Insufficient documentation

## 2021-02-09 DIAGNOSIS — Z888 Allergy status to other drugs, medicaments and biological substances status: Secondary | ICD-10-CM | POA: Diagnosis not present

## 2021-02-09 DIAGNOSIS — I10 Essential (primary) hypertension: Secondary | ICD-10-CM | POA: Insufficient documentation

## 2021-02-09 DIAGNOSIS — N403 Nodular prostate with lower urinary tract symptoms: Secondary | ICD-10-CM | POA: Diagnosis not present

## 2021-02-09 DIAGNOSIS — Z87891 Personal history of nicotine dependence: Secondary | ICD-10-CM | POA: Diagnosis not present

## 2021-02-09 DIAGNOSIS — Z9049 Acquired absence of other specified parts of digestive tract: Secondary | ICD-10-CM | POA: Diagnosis not present

## 2021-02-09 DIAGNOSIS — E7800 Pure hypercholesterolemia, unspecified: Secondary | ICD-10-CM | POA: Insufficient documentation

## 2021-02-09 DIAGNOSIS — R972 Elevated prostate specific antigen [PSA]: Secondary | ICD-10-CM | POA: Diagnosis not present

## 2021-02-09 HISTORY — DX: Unspecified symptoms and signs involving the genitourinary system: R39.9

## 2021-02-09 HISTORY — DX: Unspecified osteoarthritis, unspecified site: M19.90

## 2021-02-09 HISTORY — DX: Shortness of breath: R06.02

## 2021-02-09 HISTORY — PX: CYSTOSCOPY: SHX5120

## 2021-02-09 HISTORY — PX: RADIOACTIVE SEED IMPLANT: SHX5150

## 2021-02-09 HISTORY — PX: SPACE OAR INSTILLATION: SHX6769

## 2021-02-09 HISTORY — DX: Retinal edema: H35.81

## 2021-02-09 HISTORY — DX: Complete loss of teeth, unspecified cause, unspecified class: K08.109

## 2021-02-09 SURGERY — INSERTION, RADIATION SOURCE, PROSTATE
Anesthesia: General | Site: Rectum

## 2021-02-09 MED ORDER — FLEET ENEMA 7-19 GM/118ML RE ENEM: 1.0000 | ENEMA | Freq: Once | RECTAL | Status: DC

## 2021-02-09 MED ORDER — SODIUM CHLORIDE 0.9 % IV SOLN: 250.0000 mL | INTRAVENOUS | Status: DC | PRN

## 2021-02-09 MED ORDER — ACETAMINOPHEN 325 MG PO TABS: 325.0000 mg | ORAL_TABLET | ORAL | Status: DC | PRN

## 2021-02-09 MED ORDER — LIDOCAINE 2% (20 MG/ML) 5 ML SYRINGE
INTRAMUSCULAR | Status: DC | PRN
  Administered 2021-02-09: 80 mg via INTRAVENOUS

## 2021-02-09 MED ORDER — CIPROFLOXACIN IN D5W 400 MG/200ML IV SOLN
INTRAVENOUS | Status: AC
  Filled 2021-02-09: qty 200

## 2021-02-09 MED ORDER — SODIUM CHLORIDE 0.9% FLUSH: 3.0000 mL | INTRAVENOUS | Status: DC | PRN

## 2021-02-09 MED ORDER — SODIUM CHLORIDE 0.9% FLUSH: 3.0000 mL | Freq: Two times a day (BID) | INTRAVENOUS | Status: DC

## 2021-02-09 MED ORDER — ONDANSETRON HCL 4 MG/2ML IJ SOLN
INTRAMUSCULAR | Status: DC | PRN
  Administered 2021-02-09: 4 mg via INTRAVENOUS

## 2021-02-09 MED ORDER — ACETAMINOPHEN 10 MG/ML IV SOLN: 1000.0000 mg | Freq: Once | INTRAVENOUS | Status: DC | PRN

## 2021-02-09 MED ORDER — OXYCODONE HCL 5 MG PO TABS: 5.0000 mg | ORAL_TABLET | Freq: Once | ORAL | Status: DC | PRN

## 2021-02-09 MED ORDER — DEXAMETHASONE SODIUM PHOSPHATE 4 MG/ML IJ SOLN
INTRAMUSCULAR | Status: DC | PRN
  Administered 2021-02-09: 5 mg via INTRAVENOUS

## 2021-02-09 MED ORDER — ONDANSETRON HCL 4 MG/2ML IJ SOLN
INTRAMUSCULAR | Status: AC
  Filled 2021-02-09: qty 2

## 2021-02-09 MED ORDER — ONDANSETRON HCL 4 MG/2ML IJ SOLN: 4.0000 mg | Freq: Once | INTRAMUSCULAR | Status: DC | PRN

## 2021-02-09 MED ORDER — MIDAZOLAM HCL 2 MG/2ML IJ SOLN
INTRAMUSCULAR | Status: AC
  Filled 2021-02-09: qty 2

## 2021-02-09 MED ORDER — FENTANYL CITRATE (PF) 100 MCG/2ML IJ SOLN: 25.0000 ug | INTRAMUSCULAR | Status: DC | PRN

## 2021-02-09 MED ORDER — FENTANYL CITRATE (PF) 100 MCG/2ML IJ SOLN
INTRAMUSCULAR | Status: DC | PRN
  Administered 2021-02-09 (×2): 25 ug via INTRAVENOUS
  Administered 2021-02-09: 50 ug via INTRAVENOUS

## 2021-02-09 MED ORDER — ACETAMINOPHEN 160 MG/5ML PO SOLN: 325.0000 mg | ORAL | Status: DC | PRN

## 2021-02-09 MED ORDER — ACETAMINOPHEN 325 MG RE SUPP: 650.0000 mg | RECTAL | Status: DC | PRN

## 2021-02-09 MED ORDER — SODIUM CHLORIDE 0.9 % IR SOLN
Status: DC | PRN
  Administered 2021-02-09: 1000 mL via INTRAVESICAL

## 2021-02-09 MED ORDER — PROPOFOL 10 MG/ML IV BOLUS
INTRAVENOUS | Status: DC | PRN
  Administered 2021-02-09: 140 mg via INTRAVENOUS

## 2021-02-09 MED ORDER — PROPOFOL 10 MG/ML IV BOLUS
INTRAVENOUS | Status: AC
  Filled 2021-02-09: qty 20

## 2021-02-09 MED ORDER — IOHEXOL 300 MG/ML  SOLN
INTRAMUSCULAR | Status: DC | PRN
  Administered 2021-02-09: 7 mL

## 2021-02-09 MED ORDER — ACETAMINOPHEN 325 MG PO TABS: 650.0000 mg | ORAL_TABLET | ORAL | Status: DC | PRN

## 2021-02-09 MED ORDER — FENTANYL CITRATE (PF) 100 MCG/2ML IJ SOLN
INTRAMUSCULAR | Status: AC
  Filled 2021-02-09: qty 2

## 2021-02-09 MED ORDER — SODIUM CHLORIDE (PF) 0.9 % IJ SOLN
INTRAMUSCULAR | Status: DC | PRN
  Administered 2021-02-09: 10 mL via INTRAVENOUS

## 2021-02-09 MED ORDER — LACTATED RINGERS IV SOLN: INTRAVENOUS | Status: DC

## 2021-02-09 MED ORDER — MORPHINE SULFATE (PF) 4 MG/ML IV SOLN: 2.0000 mg | INTRAVENOUS | Status: DC | PRN

## 2021-02-09 MED ORDER — EPHEDRINE 5 MG/ML INJ
INTRAVENOUS | Status: AC
  Filled 2021-02-09: qty 5

## 2021-02-09 MED ORDER — EPHEDRINE SULFATE 50 MG/ML IJ SOLN
INTRAMUSCULAR | Status: DC | PRN
  Administered 2021-02-09: 10 mg via INTRAVENOUS
  Administered 2021-02-09: 5 mg via INTRAVENOUS
  Administered 2021-02-09: 10 mg via INTRAVENOUS

## 2021-02-09 MED ORDER — OXYCODONE HCL 5 MG PO TABS: 5.0000 mg | ORAL_TABLET | ORAL | Status: DC | PRN

## 2021-02-09 MED ORDER — CIPROFLOXACIN IN D5W 400 MG/200ML IV SOLN
400.0000 mg | INTRAVENOUS | Status: AC
  Administered 2021-02-09: 400 mg via INTRAVENOUS

## 2021-02-09 MED ORDER — OXYCODONE HCL 5 MG/5ML PO SOLN: 5.0000 mg | Freq: Once | ORAL | Status: DC | PRN

## 2021-02-09 MED ORDER — HYDROCODONE-ACETAMINOPHEN 5-325 MG PO TABS: 1.0000 | ORAL_TABLET | Freq: Four times a day (QID) | ORAL | 0 refills | Status: DC | PRN

## 2021-02-09 SURGICAL SUPPLY — 40 items
BAG DRN RND TRDRP ANRFLXCHMBR (UROLOGICAL SUPPLIES) ×3
BAG URINE DRAIN 2000ML AR STRL (UROLOGICAL SUPPLIES) ×5 IMPLANT
BLADE CLIPPER SENSICLIP SURGIC (BLADE) ×5 IMPLANT
CATH FOLEY 2WAY SLVR  5CC 16FR (CATHETERS) ×5
CATH FOLEY 2WAY SLVR 5CC 16FR (CATHETERS) ×3 IMPLANT
CATH ROBINSON RED A/P 16FR (CATHETERS) IMPLANT
CATH ROBINSON RED A/P 20FR (CATHETERS) ×5 IMPLANT
CLOTH BEACON ORANGE TIMEOUT ST (SAFETY) ×5 IMPLANT
CNTNR URN SCR LID CUP LEK RST (MISCELLANEOUS) ×6 IMPLANT
CONT SPEC 4OZ STRL OR WHT (MISCELLANEOUS) ×10
COVER BACK TABLE 60X90IN (DRAPES) ×5 IMPLANT
COVER MAYO STAND STRL (DRAPES) ×5 IMPLANT
DRAPE C-ARM 35X43 STRL (DRAPES) ×5 IMPLANT
DRSG TEGADERM 4X4.75 (GAUZE/BANDAGES/DRESSINGS) ×5 IMPLANT
DRSG TEGADERM 8X12 (GAUZE/BANDAGES/DRESSINGS) ×10 IMPLANT
GAUZE SPONGE 4X4 12PLY STRL LF (GAUZE/BANDAGES/DRESSINGS) ×5 IMPLANT
GLOVE SURG ENC MOIS LTX SZ6.5 (GLOVE) ×5 IMPLANT
GLOVE SURG ENC MOIS LTX SZ7.5 (GLOVE) IMPLANT
GLOVE SURG ENC MOIS LTX SZ8 (GLOVE) ×5 IMPLANT
GLOVE SURG ORTHO LTX SZ8.5 (GLOVE) ×5 IMPLANT
GLOVE SURG POLYISO LF SZ8 (GLOVE) ×10 IMPLANT
GLOVE SURG UNDER POLY LF SZ6.5 (GLOVE) IMPLANT
GOWN STRL REUS W/TWL LRG LVL3 (GOWN DISPOSABLE) ×5 IMPLANT
GOWN STRL REUS W/TWL XL LVL3 (GOWN DISPOSABLE) ×5 IMPLANT
HOLDER FOLEY CATH W/STRAP (MISCELLANEOUS) ×5 IMPLANT
IMPL SPACEOAR VUE SYSTEM (Spacer) ×3 IMPLANT
IMPLANT SPACEOAR VUE SYSTEM (Spacer) ×5 IMPLANT
ISEED AGX100 ×280 IMPLANT
IV NS 1000ML (IV SOLUTION) ×5
IV NS 1000ML BAXH (IV SOLUTION) ×3 IMPLANT
KIT TURNOVER CYSTO (KITS) ×5 IMPLANT
MARKER SKIN DUAL TIP RULER LAB (MISCELLANEOUS) ×5 IMPLANT
PACK CYSTO (CUSTOM PROCEDURE TRAY) ×5 IMPLANT
SURGILUBE 2OZ TUBE FLIPTOP (MISCELLANEOUS) IMPLANT
SUT BONE WAX W31G (SUTURE) IMPLANT
SYR 10ML LL (SYRINGE) IMPLANT
TOWEL OR 17X26 10 PK STRL BLUE (TOWEL DISPOSABLE) ×5 IMPLANT
UNDERPAD 30X36 HEAVY ABSORB (UNDERPADS AND DIAPERS) ×10 IMPLANT
WATER STERILE IRR 3000ML UROMA (IV SOLUTION) ×5 IMPLANT
WATER STERILE IRR 500ML POUR (IV SOLUTION) ×5 IMPLANT

## 2021-02-09 NOTE — Interval H&P Note (Signed)
History and Physical Interval Note:  02/09/2021 11:12 AM  Eric [name]  has presented today for surgery, with the diagnosis of PROSTATE [name].  The various methods of treatment have been discussed with the patient and family. After consideration of risks, benefits and other options for treatment, the patient has consented to  Procedure(s): RADIOACTIVE SEED IMPLANT/BRACHYTHERAPY IMPLANT (N/A) SPACE OAR INSTILLATION (N/A) as a surgical intervention.  The patient's history has been reviewed, patient examined, no change in status, stable for surgery.  I have reviewed the patient's chart and labs.  Questions were answered to the patient's satisfaction.     Irine Seal

## 2021-02-09 NOTE — Op Note (Signed)
PATIENT:  Eric [name]  PRE-OPERATIVE DIAGNOSIS:  Adenocarcinoma of the prostate  POST-OPERATIVE DIAGNOSIS:  Same  PROCEDURE:  Procedure(s): 1. I-125 radioactive seed implantation 2. SpaceOAR implantation. 3.  Cystoscopy  SURGEON:  Surgeon(s): Irine Seal MD  Radiation oncologist: Dr. Tyler Pita  ANESTHESIA:  General  EBL:  Minimal  DRAINS: 9 French Foley catheter  INDICATION: Eric [name] is a 75 y.o. with Stage T2a, Gleason 7(3+4) prostate cancer who has elected brachytherapy for treatment.  Description of procedure: After informed consent the patient was brought to the major OR, placed on the table and administered general anesthesia. He was then moved to the modified lithotomy position with his perineum perpendicular to the floor. His perineum and genitalia were then sterilely prepped. An official timeout was then performed. A 16 French Foley catheter was then placed in the bladder and filled with dilute contrast, a rectal tube was placed in the rectum and the transrectal ultrasound probe was placed in the rectum and affixed to the stand. He was then sterilely draped.  The sterile grid was installed.   Anchor needles were then placed.   Real time ultrasonography was used along with the seed planning software spot-pro version 3.1-00. This was used to develop the seed plan including the number of needles as well as number of seeds required for complete and adequate coverage. Real-time ultrasonography was then used along with the previously developed plan  to implant a total of 56 seeds using 19 needles for a target dose of 145 Gy. This proceeded without difficulty or complication.  The anchor needles and guide were removed and the SpaceOAR needle was passed under US guidance into the fat stripe posterior to the prostate with the tip in the midline at mid prostate. A puff of NS confirmed appropriate positioning and the SpaceOAR Vue polymer was then injected over 10 seconds into  the space with excellent distribution.     A Foley catheter was then removed as well as the transrectal ultrasound probe and rectal probe. Flexible cystoscopy was then performed using the 50 French flexible scope which revealed a mild mid bulbar stricture with an otherwise normal urethra throughout its length down to the sphincter which appeared intact. The prostatic urethra was 2-3 cm with lateral lobe hyperplasia. The bladder was then entered and fully and systematically inspected.  The ureteral orifices were noted to be of normal configuration and position. The mucosa revealed no evidence of tumors. There were also no stones identified within the bladder.  There was minimal blood in the bladder.  No seeds or spacers were seen and/or removed from the bladder.  The cystoscope was then removed.  The drapes were removed.  The perineum was cleaned and dressed.  He was taken out of the lithotomy position and was awakened and taken to recovery room in stable and satisfactory condition. He tolerated procedure well and there were no intraoperative complications.

## 2021-02-09 NOTE — Anesthesia Procedure Notes (Signed)
Procedure Name: LMA Insertion Date/Time: 02/09/2021 12:17 PM Performed by: Bufford Spikes, CRNA Pre-anesthesia Checklist: Patient identified, Emergency Drugs available, Suction available and Patient being monitored Patient Re-evaluated:Patient Re-evaluated prior to induction Oxygen Delivery Method: Circle system utilized Preoxygenation: Pre-oxygenation with 100% oxygen Induction Type: IV induction Ventilation: Mask ventilation without difficulty LMA: LMA inserted LMA Size: 4.0 Number of attempts: 1 Placement Confirmation: positive ETCO2 Tube secured with: Tape Dental Injury: Teeth and Oropharynx as per pre-operative assessment

## 2021-02-09 NOTE — Anesthesia Postprocedure Evaluation (Signed)
Anesthesia Post Note  Patient: Eric [name]  Procedure(s) Performed: RADIOACTIVE SEED IMPLANT/BRACHYTHERAPY IMPLANT (Prostate) SPACE OAR INSTILLATION (Rectum) CYSTOSCOPY (Bladder)     Patient location during evaluation: PACU Anesthesia Type: General Level of consciousness: awake and alert Pain management: pain level controlled Vital Signs Assessment: post-procedure vital signs reviewed and stable Respiratory status: spontaneous breathing, nonlabored ventilation, respiratory function stable and patient connected to nasal cannula oxygen Cardiovascular status: blood pressure returned to baseline and stable Postop Assessment: no apparent nausea or vomiting Anesthetic complications: no   No notable events documented.  Last Vitals:  Vitals:   02/09/21 1400 02/09/21 1405  BP: 124/67   Pulse: 70 67  Resp: 18 16  Temp:    SpO2: 95% 96%    Last Pain:  Vitals:   02/09/21 1405  TempSrc:   PainSc: 0-No pain                 Catalina Gravel

## 2021-02-09 NOTE — Transfer of Care (Signed)
Immediate Anesthesia Transfer of Care Note  Patient: Eric [name]  Procedure(s) Performed: RADIOACTIVE SEED IMPLANT/BRACHYTHERAPY IMPLANT (Prostate) SPACE OAR INSTILLATION (Rectum) CYSTOSCOPY (Bladder)  Patient Location: PACU  Anesthesia Type:General  Level of Consciousness: awake, alert  and oriented  Airway & Oxygen Therapy: Patient Spontanous Breathing and Patient connected to nasal cannula oxygen  Post-op Assessment: Report given to RN and Post -op Vital signs reviewed and stable  Post vital signs: Reviewed and stable  Last Vitals:  Vitals Value Taken Time  BP 111/62 02/09/21 1333  Temp    Pulse 71 02/09/21 1343  Resp 18 02/09/21 1343  SpO2 98 % 02/09/21 1343  Vitals shown include unvalidated device data.  Last Pain:  Vitals:   02/09/21 1028  TempSrc: Oral  PainSc: 0-No pain         Complications: No notable events documented.

## 2021-02-09 NOTE — Anesthesia Preprocedure Evaluation (Addendum)
Anesthesia Evaluation  Patient identified by MRN, date of birth, ID band Patient awake    Reviewed: Allergy & Precautions, NPO status , Patient's Chart, lab work & pertinent test results, reviewed documented beta blocker date and time   Airway Mallampati: III  TM Distance: >3 FB Neck ROM: Full    Dental  (+) Edentulous Upper, Edentulous Lower   Pulmonary former smoker,    Pulmonary exam normal breath sounds clear to auscultation       Cardiovascular hypertension, Pt. on medications and Pt. 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 - Aortic valve: Intuity Elite pericardial tissue valve. Normal appearing prosthetic AVR with no peri valvular regurgitation Valve area (VTI): 1.4 cm^2. Valve area (Vmean): 1.55 cm^2. - Mitral valve: Calcified annulus. Mildly thickened leaflets . - Left atrium: The atrium was mildly dilated. - Right atrium: The atrium was mildly dilated. - Atrial septum: No defect or patent foramen ovale was identified. - Pulmonic valve: Peak gradient (S): 11 mm Hg.  Cath 10/16/17: Widely patent coronaries with minor luminal irregularities.   LBBB   Neuro/Psych negative neurological ROS     GI/Hepatic negative GI ROS, Neg liver ROS,   Endo/Other  Hypothyroidism   Renal/GU negative Renal ROS     Musculoskeletal  (+) Arthritis , Osteoarthritis,    Abdominal Normal abdominal exam  (+)   Peds  Hematology negative hematology ROS (+)   Anesthesia Other Findings Day of surgery medications reviewed with the patient.  1. Left ventricular ejection fraction, by visual estimation, is 60 to  65%. The left ventricle has normal function. There is no left ventricular  hypertrophy.  2. Left ventricular diastolic parameters are consistent with Grade  I  diastolic dysfunction (impaired relaxation).  3. Endocardium not imaged well. EF appears normal but cannot completely  exclude mild apical hypokinesis. Can consider repeat echo with Definity  contrast if clinically indicated.  4. Global right ventricle has normal systolic function.The right  ventricular size is normal. No increase in right ventricular wall  thickness.  5. Left atrial size was normal.  6. Right atrial size was normal.  7. Mild mitral annular calcification.  8. The mitral valve is normal in structure. No evidence of mitral valve  regurgitation.  9. The tricuspid valve is normal in structure. Tricuspid valve  regurgitation is trivial.  10. The pulmonic valve was grossly normal. Pulmonic valve regurgitation is  not visualized.  11. Normal pulmonary artery systolic pressure.  12. The aortic valve is tricuspid. Aortic valve regurgitation is not  visualized. Mild aortic valve sclerosis without stenosis.   FINDINGS  Left Ventricle: Left ventricular ejection fraction, by visual estimation,  is 60 to 65%. The left ventricle has normal function. The left ventricular  internal cavity size was the left ventricle is normal in size. There is no  left ventricular hypertrophy.  Left ventricular diastolic parameters are consistent with Grade I  diastolic dysfunction (impaired relaxation). Endocardium not imaged well.  EF appears normal but cannot completely exclude mild apical hypokinesis.   Right Ventricle: The right ventricular size is normal. No increase in  right ventricular wall thickness. Global RV systolic function is has  normal systolic function. The tricuspid regurgitant velocity is 2.19 m/s,  and with an assumed right atrial pressure  of 3 mmHg, the estimated right ventricular systolic pressure is normal at  22.2 mmHg.   Left  Atrium: Left atrial size was normal in size.   Right Atrium: Right atrial size was normal in size   Pericardium: There is no  evidence of pericardial effusion.   Mitral Valve: The mitral valve is normal in structure. Mild mitral annular  calcification. No evidence of mitral valve regurgitation.   Tricuspid Valve: The tricuspid valve is normal in structure. Tricuspid  valve regurgitation is trivial.   Aortic Valve: The aortic valve is tricuspid. Aortic valve regurgitation is  not visualized. Mild aortic valve sclerosis is present, with no evidence  of aortic valve stenosis. Aortic valve mean gradient measures 5.0 mmHg.  Aortic valve peak gradient  measures 11.4 mmHg. Aortic valve area, by VTI measures 1.56 cm.   Pulmonic Valve: The pulmonic valve was grossly normal. Pulmonic valve  regurgitation is not visualized.   Aorta: The aortic root and ascending aorta are structurally normal, with  no evidence of dilitation.   IAS/Shunts: No atrial level shunt detected by color flow Doppler.      LEFT VENTRICLE  PLAX 2D  LVIDd:     4.40 cm Diastology  LVIDs:     3.30 cm LV e' lateral:  7.29 cm/s  LV PW:     0.90 cm LV E/e' lateral: 6.5  LV IVS:    1.10 cm LV e' medial:  5.22 cm/s  LVOT diam:   1.90 cm LV E/e' medial: 9.1  LV SV:     44 ml  LV SV Index:  21.45  LVOT Area:   2.84 cm     RIGHT VENTRICLE  RV S prime:   8.59 cm/s  TAPSE (M-mode): 1.4 cm   LEFT ATRIUM       Index    RIGHT ATRIUM      Index  LA diam:    3.80 cm 1.94 cm/m RA Area:   13.30 cm  LA Vol (A2C):  45.5 ml 23.25 ml/m RA Volume:  31.80 ml 16.25 ml/m  LA Vol (A4C):  22.7 ml 11.60 ml/m  LA Biplane Vol: 32.7 ml 16.71 ml/m  AORTIC VALVE  AV Area (Vmax):  1.40 cm  AV Area (Vmean):  1.59 cm  AV Area (VTI):   1.56 cm  AV Vmax:      169.00 cm/s  AV Vmean:     101.000 cm/s  AV VTI:      0.257 m  AV Peak Grad:   11.4 mmHg  AV Mean Grad:   5.0 mmHg  LVOT Vmax:     83.60 cm/s  LVOT Vmean:    56.700 cm/s  LVOT VTI:     0.141 m   LVOT/AV VTI ratio: 0.55    AORTA  Ao Root diam: 2.30 cm   MITRAL VALVE            TRICUSPID VALVE  MV Area (PHT): 3.17 cm       TR Peak grad:  19.2 mmHg  MV PHT:    69.31 msec      TR Vmax:    219.00 cm/s  MV Decel Time: 239 msec  MV E velocity: 47.60 cm/s 103 cm/s SHUNTS  MV A velocity: 81.00 cm/s 70.3 cm/s Systemic VTI: 0.14 m  MV E/A ratio: 0.59    1.5    Systemic Diam: 1.90 cm     Glori Bickers MD  Electronically signed by Glori Bickers MD  Signature Date/Time: 04/28/2019/4:52:38 PM      Final   Imaging Info  ECHOCARDIOGRAM COMPLETE (Order #[id number]) on 04/28/19  Study History  ECHOCARDIOGRAM COMPLETE (Order #[id number]) on 04/28/19  Syngo Images  Show images for ECHOCARDIOGRAM COMPLETE Images on [employer]  Show images for Iona Beard Performing Technologist/Nurse  Performing Technologist/Nurse: Senior, Raquel Sarna, [place] Reason for Exam Priority: Anticipated Discharge Not on file  Patient Data  Height  66 in   BP  105/60 mmHg      Surgical History  Surgical History   Procedure Laterality Date Comment Source CARDIAC CATHETERIZATION  11/10/2000 @[facility]  by dr Caryl Comes; normal coronary angiography and normal LVF   Other Surgical History   Procedure Laterality Date Comment Source AORTIC VALVE REPLACEMENT N/A 12/17/2017 Procedure: MINIMALLY INVASIVE AORTIC VALVE REPLACEMENT (AVR) ( using Edwards Intuity Valve Size 47m; Surgeon: ORexene Alberts MD; [facility] Service: Open Heart Surgery; Laterality: N/A; MINI THORACOTOMY  CATARACT EXTRACTION W/ INTRAOCULAR LENS IMPLANT Right 2018   COLONOSCOPY   last one 02-29-2020 by dr h. danis  INGUINAL HERNIA REPAIR Right 2008   LAPAROSCOPIC CHOLECYSTECTOMY  06/26/2001 @[facility] ; WITH OPEN LEFT INGUINAL HERNIA REPAIR  LUMBAR LAMINECTOMY/DECOMPRESSION MICRODISCECTOMY N/A 04/23/2018 Procedure: Microlumbar decompression Lumbar five-Sacral one;  Surgeon: BSusa Day MD; [place] [facility] Service: Orthopedics; Laterality: N/A;  PROSTATE BIOPSY  09/20/2020   RIGHT/LEFT HEART CATH AND CORONARY ANGIOGRAPHY N/A 10/16/2017 Procedure: RIGHT/LEFT HEART CATH AND CORONARY ANGIOGRAPHY; Surgeon: CSherren Mocha MD; [facility]; Service: Cardiovascular; Laterality: N/A;  TEE WITHOUT CARDIOVERSION N/A 12/17/2017 Procedure: TRANSESOPHAGEAL ECHOCARDIOGRAM (TEE); Surgeon: ORexene Alberts MD; [place] [facility] Service: Open Heart Surgery; Laterality: N/A;  TONSILLECTOMY  1960     Implants  Prosthesis/Implant Heart Cor-Knot Mini Combo Kit - [id number]- Implanted Heart  Inventory item: KIT COMBO MINI 4X17COR-KNOT Model/Cat number: [id number]: [employer] Lot number: [id number] Of Implantation: Heart   As of 12/17/2017  Status: Implanted    Valve System Edws Intuity 23a - [id number] Implanted Heart  Inventory item: VALVE SYSTEM EDWS INTUITY 23A Model/Cat number: [id number] number: [id number]: [employer] Area Of Implantation: Heart   As of 12/17/2017  Status: Implanted     Encounter-Level Documents on 04/27/2019:  Document on 05/03/2019 3:52 PM by FBennie PieriniI, LPN: IP After Visit Summary Document on 05/03/2019 3:42 PM by FBennie PieriniI, LPN: IP After Visit Summary Document on 05/02/2019 11:01 PM by KDomenick Gong ED PB Billing Extract Scan on 05/01/2019 9:43 AM by Default, Provider, MD Scan on 04/29/2019 8:00 AM by Default, Provider, MD Scan on 04/27/2019 8:38 PM by Default, Provider, MD Scan on 04/30/2019 12:51 PM by BForrest MoronD Scan on 04/27/2019 8:41 PM by HHarlin RainM: AOB   Order-Level Documents on 04/27/2019:  Scan on 05/03/2019 2:11 PM by Default, Provider, MD Scan on 04/28/2019 4:53 PM by Default, Provider, MD   Resulted by:  Signed Date/Time  Phone Pager BJolaine Artist11/18/2020 4:52  PM [phone]  External Result Report  External Result Report   ECHOCARDIOGRAM COMPLETE: Patient Communication  Add Comments Add Notifications    Existing Charges  Charge Line Charge Code Status Charge Trigger Charge Type [id number] Tte 2d Echo Complete ([id number] [facility] end exam Technical [id number] [facility] result study Professional   Rare isolated PVCs associated with 1 triggered episode. Remainder of trigger episodes no arrthymia. 2 brief runs of atrial tachycardia which were asymptomatic.     Reproductive/Obstetrics  Anesthesia Physical  Anesthesia Plan  ASA: III  Anesthesia Plan: General   Post-op Pain Management:    Induction: Intravenous  PONV Risk Score and Plan: 2 and Ondansetron, Treatment may vary due to age or medical condition and Dexamethasone  Airway Management Planned: LMA  Additional Equipment: None  Intra-op Plan:   Post-operative Plan: Extubation in OR  Informed Consent: I have reviewed the patients History and Physical, chart, labs and discussed the procedure including the risks, benefits and alternatives for the proposed anesthesia with the patient or authorized representative who has indicated his/her understanding and acceptance.     Dental advisory given  Plan Discussed with: CRNA  Anesthesia Plan Comments: ( )        Anesthesia Quick Evaluation

## 2021-02-09 NOTE — Discharge Instructions (Addendum)

## 2021-02-12 NOTE — Progress Notes (Signed)
  Radiation Oncology         [phone]) [phone] ________________________________  Name: Eric [name] MRN: [id number]  Date: 02/12/2021  DOB: 08/12/1945       Prostate Seed Implant  CC:Paz, Alda Berthold, MD  No ref. provider found  DIAGNOSIS:  Oncology History  Malignant neoplasm of prostate ([place])  09/20/2020 Cancer Staging   Staging form: Prostate, AJCC 8th Edition - Clinical stage from 09/20/2020: Stage IIB (cT2a, cN0, cM0, PSA: 3.8, Grade Group: 2) - Signed by Freeman Caldron, PA-C on 10/24/2020 Histopathologic type: Adenocarcinoma, NOS Prostate specific antigen (PSA) range: Less than 10 Gleason primary pattern: 3 Gleason secondary pattern: 4 Gleason score: 7 Histologic grading system: 5 grade system Number of biopsy cores examined: 12 Number of biopsy cores positive: 3 Location of positive needle core biopsies: One side   10/24/2020 Initial Diagnosis   Malignant neoplasm of prostate ([place])     No diagnosis found.  PROCEDURE: Insertion of radioactive I-125 seeds into the prostate gland.  RADIATION DOSE: 145 Gy, definitive therapy.  TECHNIQUE: Eric [name] was brought to the operating room with the urologist. He was placed in the dorsolithotomy position. He was catheterized and a rectal tube was inserted. The perineum was shaved, prepped and draped. The ultrasound probe was then introduced into the rectum to see the prostate gland.  TREATMENT DEVICE: A needle grid was attached to the ultrasound probe stand and anchor needles were placed.  3D PLANNING: The prostate was imaged in 3D using a sagittal sweep of the prostate probe. These images were transferred to the planning computer. There, the prostate, urethra and rectum were defined on each axial reconstructed image. Then, the software created an optimized 3D plan and a few seed positions were adjusted. The quality of the plan was reviewed using [facility] information for the target and the following two organs at risk:  Urethra and Rectum.  Then  the accepted plan was printed and handed off to the radiation therapist.  Under my supervision, the custom loading of the seeds and spacers was carried out and loaded into sealed vicryl sleeves.  These pre-loaded needles were then placed into the needle holder.Marland Kitchen  PROSTATE VOLUME STUDY:  Using transrectal ultrasound the volume of the prostate was verified to be 24.5 cc.  SPECIAL TREATMENT PROCEDURE/SUPERVISION AND HANDLING: The pre-loaded needles were then delivered under sagittal guidance. A total of 19 needles were used to deposit 56 seeds in the prostate gland. The individual seed activity was 0.379 mCi.  SpaceOAR:  Yes  COMPLEX SIMULATION: At the end of the procedure, an anterior radiograph of the pelvis was obtained to document seed positioning and count. Cystoscopy was performed to check the urethra and bladder.  MICRODOSIMETRY: At the end of the procedure, the patient was emitting 0.077 mR/hr at 1 meter. Accordingly, he was considered safe for hospital discharge.  PLAN: The patient will return to the radiation oncology clinic for post implant CT dosimetry in three weeks.   ________________________________  Sheral Apley Tammi Klippel, M.D.

## 2021-02-13 ENCOUNTER — Telehealth: Payer: Self-pay | Admitting: *Deleted

## 2021-02-13 NOTE — Telephone Encounter (Signed)
Patient states he had a radioactive seed in his prostate placed Friday. Patient states he thinks he is due for labs. Patient states he has stopped his MTX as advised. Patient did have a CBC and CMP on 02/06/2021. Patient would like to know when he should have labs again. Please advise.

## 2021-02-14 ENCOUNTER — Encounter (HOSPITAL_BASED_OUTPATIENT_CLINIC_OR_DEPARTMENT_OTHER): Payer: Self-pay | Admitting: Urology

## 2021-02-14 NOTE — Telephone Encounter (Signed)
Patient advised per Dr.Deveshwar, he should get clearance from the radiation oncologist about restarting methotrexate.  Patient advised he should get repeat labs 3 months after restarting methotrexate. Patient expressed understanding.

## 2021-02-14 NOTE — Telephone Encounter (Signed)
He should get clearance from the radiation oncologist about restarting methotrexate.  He should get repeat labs 3 months after restarting methotrexate.

## 2021-02-21 ENCOUNTER — Other Ambulatory Visit: Payer: Self-pay | Admitting: Cardiology

## 2021-02-21 NOTE — Telephone Encounter (Signed)
Carvedilol 12.5 mg # 180 x 1 refill sent to  [id number] [place], [place]

## 2021-03-01 ENCOUNTER — Other Ambulatory Visit: Payer: Self-pay | Admitting: Internal Medicine

## 2021-03-06 ENCOUNTER — Telehealth: Payer: Self-pay | Admitting: *Deleted

## 2021-03-06 NOTE — Telephone Encounter (Signed)
Called patient to remind of post seed appts. for 03-07-21, spoke with patient and he is aware of these appts.

## 2021-03-07 ENCOUNTER — Ambulatory Visit
Admission: RE | Admit: 2021-03-07 | Discharge: 2021-03-07 | Disposition: A | Discharge status: Home / Self Care | Payer: Medicare Other | Source: Ambulatory Visit | Attending: Urology | Admitting: Urology

## 2021-03-07 ENCOUNTER — Ambulatory Visit
Admission: RE | Admit: 2021-03-07 | Discharge: 2021-03-07 | Disposition: A | Discharge status: Home / Self Care | Payer: Medicare Other | Source: Ambulatory Visit | Attending: Radiation Oncology | Admitting: Radiation Oncology

## 2021-03-07 ENCOUNTER — Other Ambulatory Visit: Payer: Self-pay

## 2021-03-07 ENCOUNTER — Encounter: Payer: Self-pay | Admitting: Urology

## 2021-03-07 VITALS — BP 128/80 | HR 59 | Temp 98.5°F | Resp 20 | Ht 68.0 in | Wt 184.0 lb

## 2021-03-07 DIAGNOSIS — C61 Malignant neoplasm of prostate: Secondary | ICD-10-CM | POA: Diagnosis not present

## 2021-03-07 NOTE — Progress Notes (Addendum)
Patient reports mild fatigue, hematuria x3 days, nocturia x4, w/ urinary frequency/ urgency, mild weakness to urine stream, and occasional constipation. No other symptoms reported at this time. I-PSS Score of 8 (moderate). Meaningful use complete.  No current urinary management medications. Urology follow up- January 6th, 2022-10:30am  BP 128/80 (BP Location: Right Arm, Patient Position: Sitting, Cuff Size: Normal)   Pulse (!) 59   Temp 98.5 F (36.9 C) (Oral)   Resp 20   Ht 5\' 8"  (1.727 m)   Wt 184 lb (83.5 kg)   SpO2 100%   BMI 27.98 kg/m

## 2021-03-07 NOTE — Progress Notes (Signed)
  Radiation Oncology         [phone]) [phone] ________________________________  Name: Eric [name] MRN: [id number]  Date: 03/07/2021  DOB: May 22, 1946  COMPLEX SIMULATION NOTE  NARRATIVE:  The patient was brought to the [facility] today following prostate seed implantation approximately one month ago.  Identity was confirmed.  All relevant records and images related to the planned course of therapy were reviewed.  Then, the patient was set-up supine.  CT images were obtained.  The CT images were loaded into the planning software.  Then the prostate and rectum were contoured.  Treatment planning then occurred.  The implanted iodine 125 seeds were identified by the physics staff for projection of radiation distribution  I have requested : 3D Simulation  I have requested a DVH of the following structures: Prostate and rectum.    ________________________________  Sheral Apley Tammi Klippel, M.D.

## 2021-03-07 NOTE — Progress Notes (Signed)
Radiation Oncology         [phone]) [phone] ________________________________  Name: Eric [name] MRN: [id number]  Date: 03/07/2021  DOB: 11-Aug-1945  Post-Seed Follow-Up Visit Note  CC: Colon Branch, MD  Irine Seal, MD  Diagnosis:   75 y.o. gentleman with Stage T2a adenocarcinoma of the prostate with Gleason score of 3+4, and PSA of 3.76.    ICD-10-CM   1. Malignant neoplasm of prostate ([place])  C61       Interval Since Last Radiation:  3 weeks 02/09/21:  Insertion of radioactive I-125 seeds into the prostate gland; 145 Gy, definitive/boost therapy with placement of SpaceOAR gel.  Narrative:  The patient returns today for routine follow-up.  He is complaining of increased urinary frequency and urinary hesitation symptoms. He filled out a questionnaire regarding urinary function today providing and overall IPSS score of 8 characterizing his symptoms as mild with nocturia x4, hesitancy and mildly weakened FOS.  His pre-implant score was 3 with nocturia x2/night. He denies any abdominal pain or bowel symptoms.  ALLERGIES:  is allergic to atorvastatin and ezetimibe.  Meds: Current Outpatient Medications  Medication Sig Dispense Refill   levothyroxine (SYNTHROID) 88 MCG tablet TAKE 1 TABLET BY MOUTH DAILY BEFORE BREAKFAST. (Patient taking differently: Take 88 mcg by mouth daily before breakfast.) 90 tablet 1   methotrexate (RHEUMATREX) 2.5 MG tablet TAKE 4 TABLETS (10 MG TOTAL) BY MOUTH ONCE A WEEK. (Patient taking differently: Take 10 mg by mouth once a week. Friday's) 48 tablet 0   rosuvastatin (CRESTOR) 10 MG tablet Take 1 tablet (10 mg total) by mouth at bedtime. 90 tablet 3   spironolactone (ALDACTONE) 25 MG tablet Take 1 tablet (25 mg total) by mouth daily. 90 tablet 2   amLODipine (NORVASC) 5 MG tablet TAKE 1 TABLET BY MOUTH EVERY DAY 90 tablet 1   aspirin EC 81 MG tablet Take 81 mg by mouth daily.     carvedilol (COREG) 12.5 MG tablet TAKE 1 TABLET (12.5 MG TOTAL) BY MOUTH 2 (TWO) TIMES  DAILY WITH A MEAL. 180 tablet 1   Cholecalciferol (VITAMIN D-3 PO) Take 2,000 Units by mouth daily.     folic acid (FOLVITE) 1 MG tablet TAKE 1 TABLET BY MOUTH EVERY DAY (Patient taking differently: Take 1 mg by mouth daily.) 90 tablet 2   HYDROcodone-acetaminophen (NORCO) 5-325 MG tablet Take 1 tablet by mouth every 6 (six) hours as needed for moderate pain. (Patient not taking: Reported on 03/07/2021) 6 tablet 0   No current facility-administered medications for this encounter.    Physical Findings: In general this is a well appearing Caucasian male in no acute distress. He's alert and oriented x4 and appropriate throughout the examination. Cardiopulmonary assessment is negative for acute distress and he exhibits normal effort.   Lab Findings: Lab Results  Component Value Date   WBC 5.5 02/06/2021   HGB 13.5 02/06/2021   HCT 40.4 02/06/2021   MCV 97.1 02/06/2021   PLT 225 02/06/2021    Radiographic Findings:  Patient underwent CT imaging in our clinic for post implant dosimetry. The CT will be reviewed by Dr. Tammi Klippel to confirm there is an adequate distribution of radioactive seeds throughout the prostate gland and ensure that there are no seeds in or near the rectum.  We suspect the final radiation plan and dosimetry will show appropriate coverage of the prostate gland. He understands that we will call and inform him of any unexpected findings on further review of his imaging and dosimetry.  Impression/Plan: 75 y.o. gentleman with Stage T2a adenocarcinoma of the prostate with Gleason score of 3+4, and PSA of 3.76. The patient is recovering from the effects of radiation. His urinary symptoms should gradually improve over the next 4-6 months. We talked about this today. He is encouraged by his improvement already and is otherwise pleased with his outcome. We also talked about long-term follow-up for prostate cancer following seed implant. He understands that ongoing PSA determinations and  digital rectal exams will help perform surveillance to rule out disease recurrence. He has a follow up appointment scheduled for labs on 06/08/21 and will see Dr. Jeffie Pollock on 06/15/21. He understands what to expect with his PSA measures. Patient was also educated today about some of the long-term effects from radiation including a small risk for rectal bleeding and possibly erectile dysfunction. We talked about some of the general management approaches to these potential complications. However, I did encourage the patient to contact our office or return at any point if he has questions or concerns related to his previous radiation and prostate cancer.    Eric Johns, PA-C

## 2021-03-14 ENCOUNTER — Other Ambulatory Visit: Payer: Self-pay | Admitting: Urology

## 2021-03-14 DIAGNOSIS — C61 Malignant neoplasm of prostate: Secondary | ICD-10-CM

## 2021-03-19 ENCOUNTER — Ambulatory Visit
Admission: RE | Admit: 2021-03-19 | Discharge: 2021-03-19 | Disposition: A | Discharge status: Home / Self Care | Payer: Medicare Other | Source: Ambulatory Visit | Attending: Radiation Oncology | Admitting: Radiation Oncology

## 2021-03-19 ENCOUNTER — Encounter: Payer: Self-pay | Admitting: Radiation Oncology

## 2021-03-19 DIAGNOSIS — C61 Malignant neoplasm of prostate: Secondary | ICD-10-CM | POA: Insufficient documentation

## 2021-03-20 NOTE — Progress Notes (Signed)
  Radiation Oncology         [phone]) [phone] ________________________________  Name: Adonay Scheier MRN: [id number]  Date: 03/19/2021  DOB: [birth year]  3D Planning Note   Prostate Brachytherapy Post-Implant Dosimetry  Diagnosis:  75 y.o. gentleman with Stage T2a adenocarcinoma of the prostate with Gleason score of 3+4, and PSA of 3.76  Narrative: On a previous date, Tysean [place] returned following prostate seed implantation for post implant planning. He underwent CT scan complex simulation to delineate the three-dimensional structures of the pelvis and demonstrate the radiation distribution.  Since that time, the seed localization, and complex isodose planning with dose volume histograms have now been completed.  Results:   Prostate Coverage - The dose of radiation delivered to the 90% or more of the prostate gland (D90) was 124.75% of the prescription dose. This exceeds our goal of greater than 90%. Rectal Sparing - The volume of rectal tissue receiving the prescription dose or higher was 0.0 cc. This falls under our thresholds tolerance of 1.0 cc.  Impression: The prostate seed implant appears to show adequate target coverage and appropriate rectal sparing.  Plan:  The patient will continue to follow with urology for ongoing PSA determinations. I would anticipate a high likelihood for local tumor control with minimal risk for rectal morbidity.  ________________________________  Sheral Apley Tammi Klippel, M.D.

## 2021-03-21 ENCOUNTER — Telehealth: Payer: Self-pay | Admitting: Adult Health

## 2021-03-21 NOTE — Telephone Encounter (Signed)
Called and talked to patient about his SCP referral.  He would like to come in on 06/15/2020 at 1145.  Schedule message sent.    Eric Bihari, NP

## 2021-03-27 ENCOUNTER — Other Ambulatory Visit: Payer: Self-pay | Admitting: Physician Assistant

## 2021-03-27 NOTE — Telephone Encounter (Signed)
Next Visit: 05/17/2021   Last Visit: 12/13/2020   Last Fill: 06/29/2020  DX: Seronegative rheumatoid arthritis    Current Dose per office note 07/19/5745: folic acid 1 mg po daily  Okay to refill Folic Acid?

## 2021-04-03 ENCOUNTER — Ambulatory Visit: Payer: Medicare Other

## 2021-04-26 ENCOUNTER — Other Ambulatory Visit: Payer: Self-pay | Admitting: Physician Assistant

## 2021-04-26 NOTE — Telephone Encounter (Signed)
Next Visit: 05/17/2021  Last Visit: 12/13/2020  Last Fill: 01/29/2021  DX: Seronegative rheumatoid arthritis   Current Dose per office note 12/13/2020: Methotrexate 4 tablets by mouth once weekly   Labs: 01/10/2021 Glucose 107, total Bilirubin 1.3, RBC 4.16  Okay to refill MTX?

## 2021-04-27 ENCOUNTER — Telehealth: Payer: Self-pay | Admitting: Adult Health

## 2021-04-27 NOTE — Telephone Encounter (Signed)
R/s appt per sch msg. Called and spoke with patient. Confirmed appt

## 2021-05-08 NOTE — Progress Notes (Signed)
Office Visit Note  Patient: Eric [name]             Date of Birth: 1946-04-01           MRN: [id number]             PCP: Colon Branch, MD Referring: Colon Branch, MD Visit Date: 05/17/2021 Occupation: @GUAROCC @  Subjective:  Medication management  History of Present Illness: Eric [name] is a 75 y.o. male with a history of rheumatoid arthritis and osteoarthritis.  He came off methotrexate in September and was off till mid October.  He resumed her methotrexate and mid-October.  He did not have any flare while he was off methotrexate.  He resumed methotrexate then has been taking it on a regular basis.  He denies any joint pain or joint swelling.  Activities of Daily Living:  Patient reports morning stiffness for 0  none .   Patient Denies nocturnal pain.  Difficulty dressing/grooming: Denies Difficulty climbing stairs: Denies Difficulty getting out of chair: Reports Difficulty using hands for taps, buttons, cutlery, and/or writing: Denies  Review of Systems  Constitutional:  Negative for fatigue and night sweats.  HENT:  Negative for mouth sores, mouth dryness and nose dryness.   Eyes:  Negative for redness and dryness.  Respiratory:  Negative for shortness of breath and difficulty breathing.   Cardiovascular:  Negative for chest pain, palpitations, hypertension, irregular heartbeat and swelling in legs/feet.  Gastrointestinal:  Negative for constipation and diarrhea.  Endocrine: Positive for cold intolerance and increased urination.  Genitourinary:  Positive for difficulty urinating.  Musculoskeletal:  Positive for muscle weakness. Negative for joint pain, joint pain, joint swelling, myalgias, morning stiffness, muscle tenderness and myalgias.       Due to deconditioning.  Skin:  Negative for color change, rash, hair loss, nodules/bumps, skin tightness, ulcers and sensitivity to sunlight.  Allergic/Immunologic: Negative for susceptible to infections.  Neurological:  Negative  for dizziness, fainting, numbness, memory loss, night sweats and weakness ( ).  Hematological:  Negative for bruising/bleeding tendency and swollen glands.  Psychiatric/Behavioral:  Negative for depressed mood and sleep disturbance. The patient is not nervous/anxious.    PMFS History:  Patient Active Problem List   Diagnosis Date Noted   Malignant neoplasm of prostate ([place]) 10/24/2020   Lumbar stenosis    Heart murmur    GERD (gastroesophageal reflux disease)    Dyspnea    Cataract    Arthritis    Anxiety    Body mass index (BMI) 30.0-30.9, adult 06/23/2019   Subdural hematoma 05/05/2019   Subarachnoid hemorrhage ([place]) 04/28/2019   History of COVID-19 04/23/2019   Rheumatoid arthritis ([place]) 11/17/2018   Spinal stenosis of lumbar region 04/23/2018   HNP (herniated nucleus pulposus), lumbar 04/23/2018   Prolapsed lumbar disc 03/03/2018   Wide-complex tachycardia 12/30/2017   Left bundle branch block (LBBB) 12/18/2017   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 12/17/2017   Lower urinary tract symptoms (LUTS) 12/2017   Abnormal LFTs    Macular edema    PCP NOTES >>>>>> 04/27/2015   Hyperglycemia 10/20/2014   Dizziness 04/21/2014   Annual physical exam 04/15/2011   Aortic stenosis 04/15/2011   History of aortic valve stenosis 2011   Hypothyroidism 10/13/2007   Hyperlipidemia 10/13/2007   Hypertension 04/08/2007    Past Medical History:  Diagnosis Date   Full dentures    Heart murmur    History of aortic valve stenosis 2011   06/ 2011 dx  mild bicuspid stenosis;    then 04/ 2019 severe  stenosis   History of COVID-19 04/23/2019   positive result in epic;  per pt asymptomatic   History of subarachnoid hemorrhage 04/27/2019   hospital admission in epic, dx bilateral frontal SAH w/ left occipital skull fx nondisplaced due to syncope w/ fall secondary to dehydration/ ortho hypotension   History of supraventricular tachycardia 12/2017   followed  by cardiology   Hyperlipidemia    Hypertension    followed by cardiology and pcp   Hypothyroidism    followed by pcp   Left bundle branch block (LBBB) 12/18/2017   post op AV replacement 12-17-2017   Lower urinary tract symptoms (LUTS)    Macular edema of right eye    Dr  Sherlynn Stalls    OA (osteoarthritis)    multiple sites , followed by dr s. Estanislado Pandy   Prostate cancer [facility])    urologist--- dr Jeffie Pollock--- dx 04/ 2022, Gleason 3+4, PSA 3.76   Rheumatoid arthritis ([place]) 11/17/2018   rheumotologist-- dr s. 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   Coronary artery disease Brother        Male 1st degree relative >50, had CABG in his 51s   Emphysema Father    COPD Father    Diabetes Daughter    Colon cancer Neg Hx    Prostate cancer Neg Hx    Colon polyps Neg Hx    Esophageal cancer Neg Hx    Rectal cancer Neg Hx    Stomach cancer Neg Hx    Past Surgical History:  Procedure Laterality Date   AORTIC VALVE REPLACEMENT N/A 12/17/2017   Procedure: MINIMALLY INVASIVE AORTIC VALVE REPLACEMENT (AVR) [ using Edwards Intuity Valve Size 78mm;  Surgeon: Rexene Alberts, MD;  Location: [place];  Service: Open Heart Surgery;  Laterality: N/A;  MINI THORACOTOMY   CARDIAC CATHETERIZATION  11/10/2000   @[facility]  by dr Caryl Comes;  normal coronary angiography and normal LVF   CATARACT  EXTRACTION W/ INTRAOCULAR LENS IMPLANT Right 2018   COLONOSCOPY     last one 02-29-2020 by dr h. danis   CYSTOSCOPY N/A 02/09/2021   Procedure: Consuela Mimes;  Surgeon: Irine Seal, MD;  Location: [facility];  Service: Urology;  Laterality: N/A;   INGUINAL HERNIA REPAIR Right 2008   LAPAROSCOPIC CHOLECYSTECTOMY  06/26/2001   @[facility] ;  WITH OPEN LEFT INGUINAL HERNIA REPAIR   LUMBAR LAMINECTOMY/DECOMPRESSION MICRODISCECTOMY N/A 04/23/2018   Procedure: Microlumbar decompression Lumbar five-Sacral one;  Surgeon: Susa Day, MD;  Location: [place];  Service: Orthopedics;  Laterality: N/A;   PROSTATE BIOPSY  09/20/2020   RADIOACTIVE SEED IMPLANT N/A 02/09/2021   Procedure: RADIOACTIVE SEED IMPLANT/BRACHYTHERAPY IMPLANT;  Surgeon: Irine Seal, MD;  Location[facility];  Service: Urology;  Laterality: N/A;  56 seeds  RIGHT/LEFT HEART CATH AND CORONARY ANGIOGRAPHY N/A 10/16/2017   Procedure: RIGHT/LEFT HEART CATH AND CORONARY ANGIOGRAPHY;  Surgeon: [name], MD;  Location: [place] [facility];  Service: Cardiovascular;  Laterality: N/A;   SPACE OAR INSTILLATION N/A 02/09/2021   Procedure: SPACE OAR INSTILLATION;  Surgeon: Irine Seal, MD;  Location: [facility];  Service: Urology;  Laterality: N/A;   TEE WITHOUT CARDIOVERSION N/A 12/17/2017   Procedure: TRANSESOPHAGEAL ECHOCARDIOGRAM (TEE);  Surgeon: Rexene Alberts, MD;  Location: [place];  Service: Open Heart Surgery;  Laterality: N/A;   TONSILLECTOMY  1960   Social History   Social History Narrative   Lives w/ wife   3 children, 8 g-kids             Immunization History  Administered Date(s) Administered   Fluad Quad(high Dose 65+) 02/12/2019, 03/14/2020, 05/15/2021   Influenza Split 03/11/2013, 02/17/2014   Influenza Whole 04/13/2007, 03/15/2008, 03/10/2012   Influenza, High Dose Seasonal PF 04/04/2015, 03/28/2016, 03/18/2017, 03/16/2018   PFIZER(Purple Top)SARS-COV-2 Vaccination 08/08/2019,  08/31/2019, 03/24/2020   Pneumococcal Conjugate-13 04/21/2014   Pneumococcal Polysaccharide-23 04/15/2012   Td 06/10/1996, 06/10/2000   Tdap 04/15/2011, 10/11/2018   Zoster, Live 03/29/2015     Objective: Vital Signs: BP 95/60 (BP Location: Left Arm, Patient Position: Sitting, Cuff Size: Normal)   Pulse 65   Resp 16   Ht 5\' 8"  (1.727 m)   Wt 190 lb (86.2 kg)   BMI 28.89 kg/m    Physical Exam Vitals and nursing note reviewed.  Constitutional:      Appearance: He is well-developed.  HENT:     Head: Normocephalic and atraumatic.  Eyes:     Conjunctiva/sclera: Conjunctivae normal.     Pupils: Pupils are equal, round, and reactive to light.  Cardiovascular:     Rate and Rhythm: Normal rate and regular rhythm.     Heart sounds: Normal heart sounds.  Pulmonary:     Effort: Pulmonary effort is normal.     Breath sounds: Normal breath sounds.  Abdominal:     General: Bowel sounds are normal.     Palpations: Abdomen is soft.  Musculoskeletal:     Cervical back: Normal range of motion and neck supple.  Skin:    General: Skin is warm and dry.     Capillary Refill: Capillary refill takes less than 2 seconds.  Neurological:     Mental Status: He is alert and oriented to person, place, and time.  Psychiatric:        Behavior: Behavior normal.     Musculoskeletal Exam: C-spine was in good range of motion.  Shoulder joints, elbow joints, wrist joints with good range of motion.  He had thickening of PIP and DIP joints bilaterally but no synovitis was noted.  Hip joints, knee joints were in good range of motion.  There was no tenderness over ankles or MTPs.  CDAI Exam: CDAI Score: 0.2  Patient Global: 1 mm; Provider Global: 1 mm Swollen: 0 ; Tender: 0  Joint Exam 05/17/2021   No joint exam has been documented for this visit   There is currently no information documented on the homunculus. Go to the Rheumatology activity and complete the homunculus joint  exam.  Investigation: No additional findings.  Imaging: No results found.  Recent Labs: Lab Results  Component Value Date   WBC 5.5 02/06/2021   HGB 13.5 02/06/2021   PLT 225 02/06/2021   NA 138 02/06/2021   K 4.2 02/06/2021   CL  108 02/06/2021   CO2 23 02/06/2021   GLUCOSE 107 (H) 02/06/2021   BUN 15 02/06/2021   CREATININE 1.17 02/06/2021   BILITOT 1.3 (H) 02/06/2021   ALKPHOS 58 02/06/2021   AST 29 02/06/2021   ALT 22 02/06/2021   PROT 7.6 02/06/2021   ALBUMIN 4.5 02/06/2021   CALCIUM 9.7 02/06/2021   GFRAA 71 11/13/2020   QFTBGOLDPLUS NEGATIVE 08/24/2018    Speciality Comments: No specialty comments available.  Procedures:  No procedures performed Allergies: Atorvastatin and Ezetimibe   Assessment / Plan:     Visit Diagnoses: Seronegative rheumatoid arthritis (HCC)-he had no synovitis on my examination.  He has been doing well on methotrexate.  He had to come off methotrexate for about 6 weeks in September.  He did not have a flare.  He has resumed methotrexate without any problems.  High risk medication use - Methotrexate 4 tablets by mouth once weekly and folic acid 1 mg po daily.  - Plan: CBC with Differential/Platelet, COMPLETE METABOLIC PANEL WITH GFR today and then every 3 months to monitor for drug toxicity.  Information regarding immunization was placed in the AVS.  He was advised to hold methotrexate in case he develops an infection.  Primary osteoarthritis of both hands-joint protection muscle strengthening was discussed.  Primary osteoarthritis of both knees-he is currently not having much discomfort.  Primary osteoarthritis of both feet-doing well.  Spinal stenosis of lumbar region, unspecified whether neurogenic claudication present-he has intermittent lower back pain.  Osteopenia of multiple sites - DEXA updated on 04/23/2019 right femoral neck BMD 0.776 with T score -1.1.   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   Orders: Orders Placed This Encounter  Procedures   CBC with Differential/Platelet   COMPLETE METABOLIC PANEL WITH GFR   No orders of the defined types were placed in this encounter.    Follow-Up Instructions: Return for Rheumatoid arthritis.   [name], MD  Note - This record has been created using Editor, commissioning.  Chart creation errors have been sought, but may not always  have been located. Such creation errors do not reflect on  the standard of medical care.

## 2021-05-14 NOTE — Progress Notes (Signed)
Cardiology Office Note:    Date:  05/15/2021   ID:  Eric [name], DOB 1946-01-14, MRN [id number]  PCP:  Eric Branch, MD  Cardiologist:  Eric More, MD    Referring MD: Eric Branch, MD    ASSESSMENT:    1. 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 Nonrheumatic aortic valve stenosis   3. Left bundle [name] block (LBBB)   4. Essential hypertension   5. Hyperlipidemia, unspecified hyperlipidemia type    PLAN:    In order of problems listed above:  Eric [name] continues to do well after surgical aortic valve replacement 2019 [place] Heart Association class I continue treatment with aspirin antihypertensives high intensity statin and will plan to do echocardiogram a year 5.  I expect to get a long-term durable result from his valve intervention Stable EKG pattern left bundle [name] block Blood pressure well controlled continue his current regimen including beta-blocker and ARB Lipids at target continue his high intensity statin with surgical aortic valve replacement   Next appointment: 1 year   Medication Adjustments/Labs and Tests Ordered: Current medicines are reviewed at length with the patient today.  Concerns regarding medicines are outlined above.  Orders Placed This Encounter  Procedures   EKG 12-Lead   Meds ordered this encounter  Medications   amLODipine (NORVASC) 5 MG tablet    Sig: Take 1 tablet (5 mg total) by mouth daily.    Dispense:  90 tablet    Refill:  3   rosuvastatin (CRESTOR) 10 MG tablet    Sig: Take 1 tablet (10 mg total) by mouth at bedtime.    Dispense:  90 tablet    Refill:  3   carvedilol (COREG) 12.5 MG tablet    Sig: Take 1 tablet (12.5 mg total) by mouth 2 (two) times daily with a meal.    Dispense:  180 tablet    Refill:  3   spironolactone (ALDACTONE) 25 MG tablet    Sig: Take 1 tablet (25 mg total) by mouth daily.    Dispense:  90 tablet    Refill:  3   Chief complaint: Follow-up after  surgical aortic valve replacement  History of Present Illness:    Eric [name] is a 75 y.o. male with a hx of aortic stenosis status post surgical bioprosthetic AVR hypertension hyperlipidemia and hypothyroidism.  He was last seen 03/29/2020.    He was admitted to [name] in November 2020 with a fall and subarachnoid hemorrhage and had COVID-19 infection.  Evaluation included echocardiogram showing normal valve function ejection fraction 60 to 65% and he had mild carotid atherosclerosis but no stenotic lesion    Compliance with diet, lifestyle and medications: Yes  He completed his brachytherapy for prostate cancer without complication or difficulty. From cardiology perspective doing well he has had no shortness of breath edema chest pain palpitation or syncope. He tolerates his statin without muscle pain or weakness Most recent labs are 02/06/2021 hemoglobin 13.5 creatinine 1.17 potassium 4.2 and lipid profile in February cholesterol 150 LDL 75 Past Medical History:  Diagnosis Date   Full dentures    Heart murmur    History of aortic valve stenosis 2011   06/ 2011 dx mild bicuspid stenosis;    then 04/ 2019 severe  stenosis   History of COVID-19 04/23/2019   positive result in epic;  per pt asymptomatic   History of subarachnoid hemorrhage 04/27/2019   [name] admission in epic, dx  bilateral frontal SAH w/ left occipital skull fx nondisplaced due to syncope w/ fall secondary to dehydration/ ortho hypotension   History of supraventricular tachycardia 12/2017   followed by cardiology   Hyperlipidemia    Hypertension    followed by cardiology and pcp   Hypothyroidism    followed by pcp   Left bundle [name] block (LBBB) 12/18/2017   post op AV replacement 12-17-2017   Lower urinary tract symptoms (LUTS)    Macular edema of right eye    Eric  Eric [name]    OA (osteoarthritis)    multiple sites , followed by Eric [name]   Prostate cancer [facility])    urologist--- Eric  Eric [name]--- dx 04/ 2022, Gleason 3+4, PSA 3.76   Rheumatoid arthritis (Eric [name]) 11/17/2018   rheumotologist-- Eric [name], takes methotrexate   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 12/17/2017   Edwards Intuity Elite rapid deployment stented bovine pericardial tissue valve via right mini thoracotomy approach   SOB (shortness of breath)    per pt occasionally with yard work, but ok with stairs and normal activities   Spinal stenosis of lumbar region 04/23/2018   Wide-complex tachycardia 12/30/2017   (followed by Eric Eric [name])  post cardioversion by EMS 12-30-2017 for SVT 170s post op AV replacement 12-17-2017 , CHF, AKI    Past Surgical History:  Procedure Laterality Date   AORTIC VALVE REPLACEMENT N/A 12/17/2017   Procedure: MINIMALLY INVASIVE AORTIC VALVE REPLACEMENT (AVR) [ using Edwards Intuity Valve Size 18mm;  Surgeon: Eric Alberts, MD;  Location: [place];  Service: Open Heart Surgery;  Laterality: N/A;  MINI THORACOTOMY   CARDIAC CATHETERIZATION  11/10/2000   @[facility]  by Eric Eric [name];  normal coronary angiography and normal LVF   CATARACT EXTRACTION W/ INTRAOCULAR LENS IMPLANT Right 2018   COLONOSCOPY     last one 02-29-2020 by Eric Eric [name]   CYSTOSCOPY N/A 02/09/2021   Procedure: Eric [name];  Surgeon: Eric Seal, MD;  Location: [facility];  Service: Urology;  Laterality: N/A;   INGUINAL HERNIA REPAIR Right 2008   LAPAROSCOPIC CHOLECYSTECTOMY  06/26/2001   @[facility] ;  WITH OPEN LEFT INGUINAL HERNIA REPAIR   LUMBAR LAMINECTOMY/DECOMPRESSION MICRODISCECTOMY N/A 04/23/2018   Procedure: Microlumbar decompression Lumbar five-Sacral one;  Surgeon: Eric Day, MD;  Location: [place];  Service: Orthopedics;  Laterality: N/A;   PROSTATE BIOPSY  09/20/2020   RADIOACTIVE SEED IMPLANT N/A 02/09/2021   Procedure: RADIOACTIVE SEED IMPLANT/BRACHYTHERAPY IMPLANT;  Surgeon: Eric Seal, MD;  Location: [facility];  Service: Urology;  Laterality: N/A;   56 seeds   RIGHT/LEFT HEART CATH AND CORONARY ANGIOGRAPHY N/A 10/16/2017   Procedure: RIGHT/LEFT HEART CATH AND CORONARY ANGIOGRAPHY;  Surgeon: [name], MD;  Location: [place] [facility];  Service: Cardiovascular;  Laterality: N/A;   SPACE OAR INSTILLATION N/A 02/09/2021   Procedure: SPACE OAR INSTILLATION;  Surgeon: Eric Seal, MD;  Location: [facility];  Service: Urology;  Laterality: N/A;   TEE WITHOUT CARDIOVERSION N/A 12/17/2017   Procedure: TRANSESOPHAGEAL ECHOCARDIOGRAM (TEE);  Surgeon: Eric Alberts, MD;  Location: [place];  Service: Open Heart Surgery;  Laterality: N/A;   TONSILLECTOMY  1960    Current Medications: Current Meds  Medication Sig   aspirin EC 81 MG tablet Take 81 mg by mouth daily.   Cholecalciferol (VITAMIN D-3 PO) Take 2,000 Units by mouth daily.   folic acid (FOLVITE) 1 MG tablet TAKE 1 TABLET BY MOUTH EVERY [name]   levothyroxine (  SYNTHROID) 88 MCG tablet TAKE 1 TABLET BY MOUTH DAILY BEFORE BREAKFAST. (Patient taking differently: Take 88 mcg by mouth daily before breakfast.)   methotrexate (RHEUMATREX) 2.5 MG tablet TAKE 4 TABLETS (10 MG TOTAL) BY MOUTH ONCE A WEEK.   [DISCONTINUED] amLODipine (NORVASC) 5 MG tablet TAKE 1 TABLET BY MOUTH EVERY [name]   [DISCONTINUED] carvedilol (COREG) 12.5 MG tablet TAKE 1 TABLET (12.5 MG TOTAL) BY MOUTH 2 (TWO) TIMES DAILY WITH A MEAL.   [DISCONTINUED] rosuvastatin (CRESTOR) 10 MG tablet Take 1 tablet (10 mg total) by mouth at bedtime.   [DISCONTINUED] spironolactone (ALDACTONE) 25 MG tablet Take 1 tablet (25 mg total) by mouth daily.     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    Comment: 05/2008  Tobacco Use   Smoking status: Former     Packs/[name]: 2.00    Years: 35.00    Pack years: 70.00    Types: Cigarettes    Quit date: 04/15/1990    Years since quitting: 31.1   Smokeless tobacco: Never  Vaping Use   Vaping Use: Never used  Substance and Sexual Activity   Alcohol use: Not Currently    Comment: no ETOH since the 90s   Drug use: Never   Sexual activity: Not Currently  Other Topics Concern   Not on file  Social History Narrative   Lives w/ wife   3 children, 8 g-kids             Social Determinants of Health   Financial Resource Strain: Not on file  Food Insecurity: Not on file  Transportation Needs: Not on file  Physical Activity: Not on file  Stress: Not on file  Social Connections: Not on file     Family History: The patient's family history includes COPD in his father; Coronary artery disease in his brother; Diabetes in his brother, daughter, and mother; Emphysema in his father; Stroke in his mother. There is no history of Eric cancer, Prostate cancer, Eric polyps, Esophageal cancer, Rectal cancer, or Stomach cancer. ROS:   Please see the history of present illness.    All other systems reviewed and are negative.  EKGs/Labs/Other Studies Reviewed:    The following studies were reviewed today:  EKG:  EKG ordered today and personally reviewed.  The ekg ordered today demonstrates sinus rhythm left bundle [name] block unchanged  Recent Labs: 12/21/2020: TSH 1.31 02/06/2021: ALT 22; BUN 15; Creatinine, Ser 1.17; Hemoglobin 13.5; Platelets 225; Potassium 4.2; Sodium 138  Recent Lipid Panel    Component Value Date/Time   CHOL 150 08/07/2020 0755   CHOL 172 02/24/2018 1150   TRIG 159.0 (H) 08/07/2020 0755   TRIG 115 05/05/2006 0913   HDL 44.00 08/07/2020 0755   HDL 61 02/24/2018 1150   CHOLHDL 3 08/07/2020 0755   VLDL 31.8 08/07/2020 0755   LDLCALC 75 08/07/2020 0755   LDLCALC 110 (H) 02/22/2020 1110   LDLDIRECT 76.5 04/07/2008 1057    Physical Exam:    VS:  BP 138/66   Pulse 67   Ht 5'  8" (1.727 m)   Wt 192 lb (87.1 kg)   SpO2 99%   BMI 29.19 kg/m  Wt Readings from Last 3 Encounters:  05/15/21 192 lb (87.1 kg)  03/07/21 184 lb (83.5 kg)  02/09/21 185 lb 3.2 oz (84 kg)     GEN: Appears his age does not look ill well nourished, well developed in no acute distress HEENT: Normal NECK: No JVD; No carotid bruits LYMPHATICS: No lymphadenopathy CARDIAC: No murmur click RRR, no murmurs, rubs, gallops RESPIRATORY:  Clear to auscultation without rales, wheezing or rhonchi  ABDOMEN: Soft, non-tender, non-distended MUSCULOSKELETAL:  No edema; No deformity  SKIN: Warm and dry NEUROLOGIC:  Alert and oriented x 3 PSYCHIATRIC:  Normal affect    Signed, Eric More, MD  05/15/2021 4:35 PM    [facility]

## 2021-05-15 ENCOUNTER — Encounter: Payer: Self-pay | Admitting: Cardiology

## 2021-05-15 ENCOUNTER — Other Ambulatory Visit: Payer: Self-pay

## 2021-05-15 ENCOUNTER — Ambulatory Visit (INDEPENDENT_AMBULATORY_CARE_PROVIDER_SITE_OTHER): Payer: Medicare Other

## 2021-05-15 ENCOUNTER — Encounter: Payer: Self-pay | Admitting: Internal Medicine

## 2021-05-15 ENCOUNTER — Ambulatory Visit: Payer: Medicare Other | Admitting: Cardiology

## 2021-05-15 VITALS — BP 138/66 | HR 67 | Ht 68.0 in | Wt 192.0 lb

## 2021-05-15 DIAGNOSIS — I447 Left bundle-branch block, unspecified: Secondary | ICD-10-CM

## 2021-05-15 DIAGNOSIS — E785 Hyperlipidemia, unspecified: Secondary | ICD-10-CM

## 2021-05-15 DIAGNOSIS — Z23 Encounter for immunization: Secondary | ICD-10-CM

## 2021-05-15 DIAGNOSIS — Z953 Presence of xenogenic heart valve: Secondary | ICD-10-CM

## 2021-05-15 DIAGNOSIS — I10 Essential (primary) hypertension: Secondary | ICD-10-CM | POA: Diagnosis not present

## 2021-05-15 DIAGNOSIS — I350 Nonrheumatic aortic (valve) stenosis: Secondary | ICD-10-CM | POA: Diagnosis not present

## 2021-05-15 MED ORDER — ROSUVASTATIN CALCIUM 10 MG PO TABS: 10.0000 mg | ORAL_TABLET | Freq: Every day | ORAL | 3 refills | Status: DC

## 2021-05-15 MED ORDER — SPIRONOLACTONE 25 MG PO TABS: 25.0000 mg | ORAL_TABLET | Freq: Every day | ORAL | 3 refills | Status: DC

## 2021-05-15 MED ORDER — AMLODIPINE BESYLATE 5 MG PO TABS: 5.0000 mg | ORAL_TABLET | Freq: Every day | ORAL | 3 refills | Status: DC

## 2021-05-15 MED ORDER — CARVEDILOL 12.5 MG PO TABS: 12.5000 mg | ORAL_TABLET | Freq: Two times a day (BID) | ORAL | 3 refills | Status: DC

## 2021-05-15 NOTE — Patient Instructions (Signed)

## 2021-05-17 ENCOUNTER — Other Ambulatory Visit: Payer: Self-pay

## 2021-05-17 ENCOUNTER — Encounter: Payer: Self-pay | Admitting: Rheumatology

## 2021-05-17 ENCOUNTER — Ambulatory Visit: Payer: Medicare Other | Admitting: Rheumatology

## 2021-05-17 VITALS — BP 95/60 | HR 65 | Resp 16 | Ht 68.0 in | Wt 190.0 lb

## 2021-05-17 DIAGNOSIS — M48061 Spinal stenosis, lumbar region without neurogenic claudication: Secondary | ICD-10-CM

## 2021-05-17 DIAGNOSIS — I10 Essential (primary) hypertension: Secondary | ICD-10-CM

## 2021-05-17 DIAGNOSIS — M19072 Primary osteoarthritis, left ankle and foot: Secondary | ICD-10-CM

## 2021-05-17 DIAGNOSIS — M170 Bilateral primary osteoarthritis of knee: Secondary | ICD-10-CM | POA: Diagnosis not present

## 2021-05-17 DIAGNOSIS — Z79899 Other long term (current) drug therapy: Secondary | ICD-10-CM | POA: Diagnosis not present

## 2021-05-17 DIAGNOSIS — M19071 Primary osteoarthritis, right ankle and foot: Secondary | ICD-10-CM

## 2021-05-17 DIAGNOSIS — M8589 Other specified disorders of bone density and structure, multiple sites: Secondary | ICD-10-CM

## 2021-05-17 DIAGNOSIS — M0600 Rheumatoid arthritis without rheumatoid factor, unspecified site: Secondary | ICD-10-CM | POA: Diagnosis not present

## 2021-05-17 DIAGNOSIS — C61 Malignant neoplasm of prostate: Secondary | ICD-10-CM

## 2021-05-17 DIAGNOSIS — Z8659 Personal history of other mental and behavioral disorders: Secondary | ICD-10-CM

## 2021-05-17 DIAGNOSIS — Z8639 Personal history of other endocrine, nutritional and metabolic disease: Secondary | ICD-10-CM

## 2021-05-17 DIAGNOSIS — Z953 Presence of xenogenic heart valve: Secondary | ICD-10-CM

## 2021-05-17 DIAGNOSIS — M19041 Primary osteoarthritis, right hand: Secondary | ICD-10-CM

## 2021-05-17 DIAGNOSIS — Z840 Family history of diseases of the skin and subcutaneous tissue: Secondary | ICD-10-CM

## 2021-05-17 DIAGNOSIS — M19042 Primary osteoarthritis, left hand: Secondary | ICD-10-CM

## 2021-05-17 DIAGNOSIS — I447 Left bundle-branch block, unspecified: Secondary | ICD-10-CM

## 2021-05-17 NOTE — Patient Instructions (Signed)
Standing Labs We placed an order today for your standing lab work.   Please have your standing labs drawn in March  If possible, please have your labs drawn 2 weeks prior to your appointment so that the provider can discuss your results at your appointment.  Please note that you may see your imaging and lab results in [facility] before we have reviewed them. We may be awaiting multiple results to interpret others before contacting you. Please allow our office up to 72 hours to thoroughly review all of the results before contacting the office for clarification of your results.  We have open lab daily: Monday through Thursday from 1:30-4:30 PM and Friday from 1:30-4:00 PM at the office of Dr. [name], [facility].   Please be advised, all patients with office appointments requiring lab work will take precedent over walk-in lab work.  If possible, please come for your lab work on Monday and Friday afternoons, as you may experience shorter wait times. The office is located at [address], [place], [place], [place] [address] No appointment is necessary.   Labs are drawn by [facility]. Please bring your co-pay at the time of your lab draw.  You may receive a bill from [facility] for your lab work.  If you wish to have your labs drawn at another location, please call the office 24 hours in advance to send orders.  If you have any questions regarding directions or hours of operation,  please call [phone].   As a reminder, please drink plenty of water prior to coming for your lab work. Thanks!   Vaccines You are taking a medication(s) that can suppress your immune system.  The following immunizations are recommended: Flu annually Covid-19  Td/Tdap (tetanus, diphtheria, pertussis) every 10 years Pneumonia (Prevnar 15 then Pneumovax 23 at least 1 year apart.  Alternatively, can take Prevnar 20 without needing additional dose) Shingrix: 2 doses from 4 weeks to 6 months  apart  Please check with your PCP to make sure you are up to date.   If you have signs or symptoms of an infection or start antibiotics: First, call your PCP for workup of your infection. Hold your medication through the infection, until you complete your antibiotics, and until symptoms resolve if you take the following: Injectable medication (Actemra, Benlysta, Cimzia, Cosentyx, Enbrel, Humira, Kevzara, Orencia, Remicade, Simponi, Stelara, Taltz, Tremfya) Methotrexate Leflunomide (Arava) Mycophenolate (Cellcept) Morrie Sheldon, Olumiant, or [name]

## 2021-05-18 ENCOUNTER — Telehealth: Payer: Self-pay | Admitting: *Deleted

## 2021-05-18 DIAGNOSIS — Z79899 Other long term (current) drug therapy: Secondary | ICD-10-CM

## 2021-05-18 LAB — COMPLETE METABOLIC PANEL WITH GFR
AG Ratio: 1.7 (calc) (ref 1.0–2.5)
ALT: 20 U/L (ref 9–46)
AST: 26 U/L (ref 10–35)
Albumin: 4.6 g/dL (ref 3.6–5.1)
Alkaline phosphatase (APISO): 70 U/L (ref 35–144)
BUN/Creatinine Ratio: 13 (calc) (ref 6–22)
BUN: 17 mg/dL (ref 7–25)
CO2: 27 mmol/L (ref 20–32)
Calcium: 10 mg/dL (ref 8.6–10.3)
Chloride: 104 mmol/L (ref 98–110)
Creat: 1.31 mg/dL — ABNORMAL HIGH (ref 0.70–1.28)
Globulin: 2.7 g/dL (calc) (ref 1.9–3.7)
Glucose, Bld: 101 mg/dL — ABNORMAL HIGH (ref 65–99)
Potassium: 4.6 mmol/L (ref 3.5–5.3)
Sodium: 139 mmol/L (ref 135–146)
Total Bilirubin: 0.8 mg/dL (ref 0.2–1.2)
Total Protein: 7.3 g/dL (ref 6.1–8.1)
eGFR: 57 mL/min/{1.73_m2} — ABNORMAL LOW (ref >=60)

## 2021-05-18 LAB — CBC WITH DIFFERENTIAL/PLATELET
Absolute Monocytes: 799 cells/uL (ref 200–950)
Basophils Absolute: 9 cells/uL (ref 0–200)
Basophils Relative: 0.2 %
Eosinophils Absolute: 71 cells/uL (ref 15–500)
Eosinophils Relative: 1.5 %
HCT: 41.4 % (ref 38.5–50.0)
Hemoglobin: 13.6 g/dL (ref 13.2–17.1)
Lymphs Abs: 837 cells/uL — ABNORMAL LOW (ref 850–3900)
MCH: 31.9 pg (ref 27.0–33.0)
MCHC: 32.9 g/dL (ref 32.0–36.0)
MCV: 97 fL (ref 80.0–100.0)
MPV: 9.8 fL (ref 7.5–12.5)
Monocytes Relative: 17 %
Neutro Abs: 2985 cells/uL (ref 1500–7800)
Neutrophils Relative %: 63.5 %
Platelets: 217 10*3/uL (ref 140–400)
RBC: 4.27 10*6/uL (ref 4.20–5.80)
RDW: 14.2 % (ref 11.0–15.0)
Total Lymphocyte: 17.8 %
WBC: 4.7 10*3/uL (ref 3.8–10.8)

## 2021-05-18 NOTE — Progress Notes (Signed)
Lymphocyte count is low due to methotrexate use.  Creatinine is mildly elevated probably due to the combination of methotrexate and diuretics.  Recommend repeat BMP in 1 month.

## 2021-05-18 NOTE — Telephone Encounter (Signed)
-----   Message from [name], MD sent at 05/18/2021  9:25 AM EST ----- Lymphocyte count is low due to methotrexate use.  Creatinine is mildly elevated probably due to the combination of methotrexate and diuretics.  Recommend repeat BMP in 1 month.

## 2021-06-15 ENCOUNTER — Encounter: Payer: Medicare Other | Admitting: Adult Health

## 2021-06-22 ENCOUNTER — Other Ambulatory Visit: Payer: Self-pay | Admitting: *Deleted

## 2021-06-22 DIAGNOSIS — Z79899 Other long term (current) drug therapy: Secondary | ICD-10-CM

## 2021-06-23 LAB — BASIC METABOLIC PANEL WITH GFR
BUN: 13 mg/dL (ref 7–25)
CO2: 27 mmol/L (ref 20–32)
Calcium: 9.9 mg/dL (ref 8.6–10.3)
Chloride: 103 mmol/L (ref 98–110)
Creat: 1.24 mg/dL (ref 0.70–1.28)
Glucose, Bld: 103 mg/dL — ABNORMAL HIGH (ref 65–99)
Potassium: 4.9 mmol/L (ref 3.5–5.3)
Sodium: 139 mmol/L (ref 135–146)
eGFR: 61 mL/min/{1.73_m2} (ref >=60)

## 2021-06-23 NOTE — Progress Notes (Signed)
Kidney function test is normal now.

## 2021-06-26 ENCOUNTER — Ambulatory Visit (INDEPENDENT_AMBULATORY_CARE_PROVIDER_SITE_OTHER): Payer: Medicare Other | Admitting: Internal Medicine

## 2021-06-26 ENCOUNTER — Encounter: Payer: Self-pay | Admitting: Internal Medicine

## 2021-06-26 VITALS — BP 132/72 | HR 69 | Temp 98.1°F | Resp 18 | Ht 68.0 in | Wt 186.2 lb

## 2021-06-26 DIAGNOSIS — Z0000 Encounter for general adult medical examination without abnormal findings: Secondary | ICD-10-CM

## 2021-06-26 DIAGNOSIS — E038 Other specified hypothyroidism: Secondary | ICD-10-CM | POA: Diagnosis not present

## 2021-06-26 DIAGNOSIS — E785 Hyperlipidemia, unspecified: Secondary | ICD-10-CM | POA: Diagnosis not present

## 2021-06-26 DIAGNOSIS — Z23 Encounter for immunization: Secondary | ICD-10-CM | POA: Diagnosis not present

## 2021-06-26 LAB — LIPID PANEL
Cholesterol: 149 mg/dL (ref 0–200)
HDL: 48.8 mg/dL (ref >39.00)
LDL Cholesterol: 69 mg/dL (ref 0–99)
NonHDL: 100.27
Total CHOL/HDL Ratio: 3
Triglycerides: 157 mg/dL — ABNORMAL HIGH (ref 0.0–149.0)
VLDL: 31.4 mg/dL (ref 0.0–40.0)

## 2021-06-26 LAB — TSH: TSH: 2.59 u[IU]/mL (ref 0.35–5.50)

## 2021-06-26 MED ORDER — BENZONATATE 200 MG PO CAPS: 200.0000 mg | ORAL_CAPSULE | Freq: Three times a day (TID) | ORAL | 0 refills | Status: DC | PRN

## 2021-06-26 NOTE — Patient Instructions (Addendum)
Check the  blood pressure regularly BP GOAL is between 110/65 and  135/85. If it is consistently higher or lower, let me know  Cough: OTC Mucinex DM as needed. Flonase: 2 sprays on each side of the nose daily Tessalon Perles, up to every 8 hours, prescription sent. Call if not gradually better  GO TO THE LAB : Get the blood work     [place], [place] back for a checkup in 6 months

## 2021-06-26 NOTE — Progress Notes (Signed)
Subjective:    Patient ID: Eric [name], male    DOB: 1946/03/01, 76 y.o.   MRN: [id number]  DOS:  06/26/2021 Type of visit - description: CPX  Here for CPX. Since the last office visit had several visits with the specialists; also seed implant for prostate cancer. Still has some urinary frequency but no dysuria or blood in the urine. Had a cold few weeks ago, COVID-negative, still has a lingering cough without sputum production, fever or chest congestion.  Review of Systems  Other than above, a 14 point review of systems is negative      Past Medical History:  Diagnosis Date   Full dentures    Heart murmur    History of aortic valve stenosis 2011   06/ 2011 dx mild bicuspid stenosis;    then 04/ 2019 severe  stenosis   History of COVID-19 04/23/2019   positive result in epic;  per pt asymptomatic   History of subarachnoid hemorrhage 04/27/2019   hospital admission in epic, dx bilateral frontal SAH w/ left occipital skull fx nondisplaced due to syncope w/ fall secondary to dehydration/ ortho hypotension   History of supraventricular tachycardia 12/2017   followed by cardiology   Hyperlipidemia    Hypertension    followed by cardiology and pcp   Hypothyroidism    followed by pcp   Left bundle branch block (LBBB) 12/18/2017   post op AV replacement 12-17-2017   Lower urinary tract symptoms (LUTS)    Macular edema of right eye    Dr  Sherlynn Stalls    OA (osteoarthritis)    multiple sites , followed by dr s. Estanislado Pandy   Prostate cancer [facility])    urologist--- dr Jeffie Pollock--- dx 04/ 2022, Gleason 3+4, PSA 3.76   Rheumatoid arthritis ([place]) 11/17/2018   rheumotologist-- dr s. 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 12/17/2017   Edwards Intuity Elite rapid deployment stented bovine pericardial tissue valve via right mini thoracotomy approach   SOB (shortness of breath)    per pt occasionally with yard work, but ok  with stairs and normal activities   Spinal stenosis of lumbar region 04/23/2018   Wide-complex tachycardia 12/30/2017   (followed by dr Bettina Gavia)  post cardioversion by EMS 12-30-2017 for SVT 170s post op AV replacement 12-17-2017 , CHF, AKI    Past Surgical History:  Procedure Laterality Date   AORTIC VALVE REPLACEMENT N/A 12/17/2017   Procedure: MINIMALLY INVASIVE AORTIC VALVE REPLACEMENT (AVR) [ using Edwards Intuity Valve Size 71mm;  Surgeon: Rexene Alberts, MD;  Location: [facility];  Service: Open Heart Surgery;  Laterality: N/A;  MINI THORACOTOMY   CARDIAC CATHETERIZATION  11/10/2000   @[facility]  by dr Caryl Comes;  normal coronary angiography and normal LVF   CATARACT EXTRACTION W/ INTRAOCULAR LENS IMPLANT Right 2018   COLONOSCOPY     last one 02-29-2020 by dr h. danis   CYSTOSCOPY N/A 02/09/2021   Procedure: Consuela Mimes;  Surgeon: Irine Seal, MD;  Location[facility];  Service: Urology;  Laterality: N/A;   INGUINAL HERNIA REPAIR Right 2008   LAPAROSCOPIC CHOLECYSTECTOMY  06/26/2001   @[facility] ;  WITH OPEN LEFT INGUINAL HERNIA REPAIR   LUMBAR LAMINECTOMY/DECOMPRESSION MICRODISCECTOMY N/A 04/23/2018   Procedure: Microlumbar decompression Lumbar five-Sacral one;  Surgeon: Susa Day, MD;  Location: [facility];  Service: Orthopedics;  Laterality: N/A;   PROSTATE BIOPSY  09/20/2020   RADIOACTIVE SEED IMPLANT N/A 02/09/2021   Procedure: RADIOACTIVE SEED IMPLANT/BRACHYTHERAPY IMPLANT;  Surgeon: Irine Seal, MD;  [facility];  Service: Urology;  Laterality: N/A;  56 seeds   RIGHT/LEFT HEART CATH AND CORONARY ANGIOGRAPHY N/A 10/16/2017   Procedure: RIGHT/LEFT HEART CATH AND CORONARY ANGIOGRAPHY;  Surgeon: [name], MD;  Location: [facility] [facility];  Service: Cardiovascular;  Laterality: N/A;   SPACE OAR INSTILLATION N/A 02/09/2021   Procedure: SPACE OAR INSTILLATION;  Surgeon: Irine Seal, MD;  Location[facility];  Service: Urology;  Laterality: N/A;    TEE WITHOUT CARDIOVERSION N/A 12/17/2017   Procedure: TRANSESOPHAGEAL ECHOCARDIOGRAM (TEE);  Surgeon: Rexene Alberts, MD;  Location: [facility];  Service: Open Heart Surgery;  Laterality: N/A;   TONSILLECTOMY  1960    Social History   Socioeconomic History   Marital status: Married    Spouse name: Not on file   Number of children: 3   Years of education: Not on file   Highest education level: Not on file  Occupational History   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    Comment: 05/2008  Tobacco Use   Smoking status: Former    Packs/day: 2.00    Years: 35.00    Pack years: 70.00    Types: Cigarettes    Quit date: 04/15/1990    Years since quitting: 31.2   Smokeless tobacco: Never  Vaping Use   Vaping Use: Never used  Substance and Sexual Activity   Alcohol use: Not Currently    Comment: no ETOH since the 90s   Drug use: Never   Sexual activity: Not Currently  Other Topics Concern   Not on file  Social History Narrative   Lives w/ wife   3 children, 8 g-kids             Social Determinants of Health   Financial Resource Strain: Not on file  Food Insecurity: Not on file  Transportation Needs: Not on file  Physical Activity: Not on file  Stress: Not on file  Social Connections: Not on file  Intimate Partner Violence: Not on file     Current Outpatient Medications  Medication Instructions   amLODipine (NORVASC) 5 mg, Oral, Daily   aspirin EC 81 mg, Oral, Daily   benzonatate (TESSALON) 200 mg, Oral, 3 times daily PRN   carvedilol (COREG) 12.5 mg, Oral, 2 times daily with meals   Cholecalciferol (VITAMIN D-3 PO) 2,000 Units, Oral, Daily   folic acid (FOLVITE) 1 MG tablet TAKE 1 TABLET BY MOUTH EVERY DAY   levothyroxine (SYNTHROID) 88 MCG tablet TAKE 1 TABLET BY MOUTH DAILY BEFORE BREAKFAST.   methotrexate (RHEUMATREX) 10 mg, Oral, Weekly   rosuvastatin (CRESTOR) 10 mg, Oral, Daily at bedtime   spironolactone (ALDACTONE) 25 mg, Oral, Daily        Objective:   Physical Exam BP 132/72 (BP Location: Left Arm, Patient Position: Sitting, Cuff Size: Small)    Pulse 69    Temp 98.1 F (36.7 C) (Oral)    Resp 18    Ht 5\' 8"  (1.727 m)    Wt 186 lb 4 oz (84.5 kg)    SpO2 98%    BMI 28.32 kg/m  General: Well developed, NAD, BMI noted Neck: No  thyromegaly  HEENT:  Normocephalic . Face symmetric, atraumatic Lungs:  CTA B Normal respiratory effort, no intercostal retractions, no accessory muscle use. Heart: RRR,  no murmur.  Abdomen:  Not distended, soft, non-tender. No rebound or rigidity.   Lower extremities: no pretibial edema bilaterally  Skin: Exposed areas without rash. Not pale. Not jaundice Neurologic:  alert & oriented X3.  Speech normal, gait appropriate for age and unassisted Strength symmetric and appropriate for age.  Psych: Cognition and judgment appear intact.  Cooperative with normal attention span and concentration.  Behavior appropriate. No anxious or depressed appearing.     Assessment     Assessment HTN Hyperlipidemia Hypothyroidism R.A. dx 2020 DJD Increase LFTs Ultrasound 2002 done for increased LFTs: Normal liver; Hep C negative 02-16-15 CV: Aortic stenosis , dx  2012 , s/p AoVR 12/2017 Fusiform infrarenal abdominal aortic ectasis: Pt's Brother has CAD dx age 90s --Patient himself has a cardiac catheterization with minimal to no disease 10/2017 Macular degeneration, cataracts  Subarachnoid hemorrhage, left orbital contusion, left occipital fracture 2020 Prostate cancer, Dx 09-2020 COVID-19 infection 04/2019  PLAN HTN: Well-controlled, continue present care Hyperlipidemia: On Crestor, check FLP, last LFTs normal R.A. Saw rheumatology 05/17/2021.  Creatinine was noted to be slightly elevated.  Repeated BMP very good. Hypothyroidism: Checking a TSH. Cardiovascular: Saw cardiology 05/15/2021.  Noted to be stable. Prostate cancer: Status post radioactive seed.  Tolerating well.   Cough: Had a cough few  weeks ago, has remaining cough.  Recommend Mucinex DM, Flonase.  Rx [employer]  Call if not better. RTC 6 months     This visit occurred during the SARS-CoV-2 public health emergency.  Safety protocols were in place, including screening questions prior to the visit, additional usage of staff PPE, and extensive cleaning of exam room while observing appropriate contact time as indicated for disinfecting solutions.

## 2021-06-26 NOTE — Assessment & Plan Note (Signed)
-  Td 2020 -PNM 23: 2013;  Prevnar: 2015; PNM 20: Today -  zostavax:2016 - shingrix: declined again -COVID vax: rec bivalent, benefits d/w pt - Had a flu shot -CCS: per  patient: Cscope x 2, last  04-2006, cscope 12- 2017, C-scope 02-2020, next per GI -Dx with prostate cancer 4-/2022.  Follow-up by urology.  To have a PSA tomorrow. - Labs reviewed, check FLP, TSH, otherwise up-to-date. -diet and exercise: counseled  -ACP: Documents charted.

## 2021-06-26 NOTE — Assessment & Plan Note (Signed)
HTN: Well-controlled, continue present care Hyperlipidemia: On Crestor, check FLP, last LFTs normal R.A. Saw rheumatology 05/17/2021.  Creatinine was noted to be slightly elevated.  Repeated BMP very good. Hypothyroidism: Checking a TSH. Cardiovascular: Saw cardiology 05/15/2021.  Noted to be stable. Prostate cancer: Status post radioactive seed.  Tolerating well.   Cough: Had a cough few weeks ago, has remaining cough.  Recommend Mucinex DM, Flonase.  Rx [employer]  Call if not better. RTC 6 months

## 2021-07-05 ENCOUNTER — Telehealth: Payer: Self-pay | Admitting: *Deleted

## 2021-07-09 ENCOUNTER — Other Ambulatory Visit: Payer: Self-pay

## 2021-07-09 ENCOUNTER — Inpatient Hospital Stay: Payer: Medicare Other | Attending: Adult Health | Admitting: Adult Health

## 2021-07-09 ENCOUNTER — Encounter: Payer: Self-pay | Admitting: Adult Health

## 2021-07-09 VITALS — BP 129/68 | HR 64 | Temp 97.7°F | Resp 16 | Wt 185.0 lb

## 2021-07-09 DIAGNOSIS — Z87891 Personal history of nicotine dependence: Secondary | ICD-10-CM | POA: Diagnosis not present

## 2021-07-09 DIAGNOSIS — R351 Nocturia: Secondary | ICD-10-CM | POA: Diagnosis not present

## 2021-07-09 DIAGNOSIS — C61 Malignant neoplasm of prostate: Secondary | ICD-10-CM

## 2021-07-09 NOTE — Progress Notes (Signed)
SURVIVORSHIP VISIT:   BRIEF ONCOLOGIC HISTORY:  Oncology History  Malignant neoplasm of prostate ([place])  09/20/2020 Cancer Staging   Staging form: Prostate, AJCC 8th Edition - Clinical stage from 09/20/2020: Stage IIB (cT2a, cN0, cM0, PSA: 3.8, Grade Group: 2) - Signed by Freeman Caldron, PA-C on 10/24/2020 Histopathologic type: Adenocarcinoma, NOS Prostate specific antigen (PSA) range: Less than 10 Gleason primary pattern: 3 Gleason secondary pattern: 4 Gleason score: 7 Histologic grading system: 5 grade system Number of biopsy cores examined: 12 Number of biopsy cores positive: 3 Location of positive needle core biopsies: One side    10/24/2020 Initial Diagnosis   Malignant neoplasm of prostate [facility])     INTERVAL HISTORY:  Mr. Hinderliter is here today for follow-up of his history of prostate cancer and to discuss and review his survivorship care plan.  He is doing well.  His main issue is nocturia x 3-4 times per night and doesn't sleep as well as he previously did.  His most recent bone density was completed in 2020 and normal.    His most recent PSA was checked last week and was 0.19.  He has follow-up scheduled with Dr. Jeffie Pollock in 6 months.  Johnny notes that he is doing quite well today.  REVIEW OF SYSTEMS:  Review of Systems  Constitutional:  Negative for appetite change, chills, fatigue, fever and unexpected weight change.  HENT:   Negative for hearing loss, lump/mass and trouble swallowing.   Eyes:  Negative for eye problems and icterus.  Respiratory:  Negative for chest tightness, cough and shortness of breath.   Cardiovascular:  Negative for chest pain, leg swelling and palpitations.  Gastrointestinal:  Negative for abdominal distention, abdominal pain, constipation, diarrhea, nausea and vomiting.  Endocrine: Negative for hot flashes.  Genitourinary:  Negative for difficulty urinating.   Musculoskeletal:  Negative for arthralgias.  Skin:  Negative for itching and rash.   Neurological:  Negative for dizziness, extremity weakness, headaches and numbness.  Hematological:  Negative for adenopathy. Does not bruise/bleed easily.  Psychiatric/Behavioral:  Negative for depression. The patient is not nervous/anxious.   Breast: Denies any new nodularity, masses, tenderness, nipple changes, or nipple discharge.      ONCOLOGY TREATMENT TEAM:  Urologist: Dr. Jeffie Pollock Radiation Oncologist: Dr. Tammi Klippel    PAST MEDICAL/SURGICAL HISTORY:  Past Medical History:  Diagnosis Date   Full dentures    Heart murmur    History of aortic valve stenosis 2011   06/ 2011 dx mild bicuspid stenosis;    then 04/ 2019 severe  stenosis   History of COVID-19 04/23/2019   positive result in epic;  per pt asymptomatic   History of subarachnoid hemorrhage 04/27/2019   hospital admission in epic, dx bilateral frontal SAH w/ left occipital skull fx nondisplaced due to syncope w/ fall secondary to dehydration/ ortho hypotension   History of supraventricular tachycardia 12/2017   followed by cardiology   Hyperlipidemia    Hypertension    followed by cardiology and pcp   Hypothyroidism    followed by pcp   Left bundle branch block (LBBB) 12/18/2017   post op AV replacement 12-17-2017   Lower urinary tract symptoms (LUTS)    Macular edema of right eye    Dr  Sherlynn Stalls    OA (osteoarthritis)    multiple sites , followed by dr s. Estanislado Pandy   Prostate cancer [facility])    urologist--- dr Jeffie Pollock--- dx 04/ 2022, Gleason 3+4, PSA 3.76   Rheumatoid arthritis ([place]) 11/17/2018   rheumotologist--  dr s. 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 12/17/2017   Ed[employer] Elite rapid deployment stented bovine pericardial tissue valve via right mini thoracotomy approach   SOB (shortness of breath)    per pt occasionally with yard work, but ok with stairs and normal activities   Spinal stenosis of lumbar region 04/23/2018    Wide-complex tachycardia 12/30/2017   (followed by dr Bettina Gavia)  post cardioversion by EMS 12-30-2017 for SVT 170s post op AV replacement 12-17-2017 , CHF, AKI   Past Surgical History:  Procedure Laterality Date   AORTIC VALVE REPLACEMENT N/A 12/17/2017   Procedure: MINIMALLY INVASIVE AORTIC VALVE REPLACEMENT (AVR) [ using [employer] Valve Size 7mm;  Surgeon: Rexene Alberts, MD;  Location: [place];  Service: Open Heart Surgery;  Laterality: N/A;  MINI THORACOTOMY   CARDIAC CATHETERIZATION  11/10/2000   @[facility]  by dr Caryl Comes;  normal coronary angiography and normal LVF   CATARACT EXTRACTION W/ INTRAOCULAR LENS IMPLANT Right 2018   COLONOSCOPY     last one 02-29-2020 by dr h. danis   CYSTOSCOPY N/A 02/09/2021   Procedure: Consuela Mimes;  Surgeon: Irine Seal, MD;  Location[facility];  Service: Urology;  Laterality: N/A;   INGUINAL HERNIA REPAIR Right 2008   LAPAROSCOPIC CHOLECYSTECTOMY  06/26/2001   @[facility] ;  WITH OPEN LEFT INGUINAL HERNIA REPAIR   LUMBAR LAMINECTOMY/DECOMPRESSION MICRODISCECTOMY N/A 04/23/2018   Procedure: Microlumbar decompression Lumbar five-Sacral one;  Surgeon: Susa Day, MD;  Location: [place];  Service: Orthopedics;  Laterality: N/A;   PROSTATE BIOPSY  09/20/2020   RADIOACTIVE SEED IMPLANT N/A 02/09/2021   Procedure: RADIOACTIVE SEED IMPLANT/BRACHYTHERAPY IMPLANT;  Surgeon: Irine Seal, MD;  Location: [facility];  Service: Urology;  Laterality: N/A;  56 seeds   RIGHT/LEFT HEART CATH AND CORONARY ANGIOGRAPHY N/A 10/16/2017   Procedure: RIGHT/LEFT HEART CATH AND CORONARY ANGIOGRAPHY;  Surgeon: [name], MD;  Location: [place] [facility];  Service: Cardiovascular;  Laterality: N/A;   SPACE OAR INSTILLATION N/A 02/09/2021   Procedure: SPACE OAR INSTILLATION;  Surgeon: Irine Seal, MD;  Location: [facility];  Service: Urology;  Laterality: N/A;   TEE WITHOUT CARDIOVERSION N/A 12/17/2017   Procedure: TRANSESOPHAGEAL  ECHOCARDIOGRAM (TEE);  Surgeon: Rexene Alberts, MD;  Location: [place];  Service: Open Heart Surgery;  Laterality: N/A;   TONSILLECTOMY  1960     ALLERGIES:  Allergies  Allergen Reactions   Atorvastatin Other (See Comments)    REACTION: myalgia   Ezetimibe Other (See Comments)    REACTION: myalgia     CURRENT MEDICATIONS:  Outpatient Encounter Medications as of 07/09/2021  Medication Sig   amLODipine (NORVASC) 5 MG tablet Take 1 tablet (5 mg total) by mouth daily.   aspirin EC 81 MG tablet Take 81 mg by mouth daily.   benzonatate (TESSALON) 200 MG capsule Take 1 capsule (200 mg total) by mouth 3 (three) times daily as needed for cough.   carvedilol (COREG) 12.5 MG tablet Take 1 tablet (12.5 mg total) by mouth 2 (two) times daily with a meal.   Cholecalciferol (VITAMIN D-3 PO) Take 2,000 Units by mouth daily.   folic acid (FOLVITE) 1 MG tablet TAKE 1 TABLET BY MOUTH EVERY DAY   levothyroxine (SYNTHROID) 88 MCG tablet TAKE 1 TABLET BY MOUTH DAILY BEFORE BREAKFAST. (Patient taking differently: Take 88 mcg by mouth daily before breakfast.)   methotrexate (RHEUMATREX) 2.5 MG tablet TAKE 4 TABLETS (10 MG TOTAL) BY  MOUTH ONCE A WEEK.   rosuvastatin (CRESTOR) 10 MG tablet Take 1 tablet (10 mg total) by mouth at bedtime.   spironolactone (ALDACTONE) 25 MG tablet Take 1 tablet (25 mg total) by mouth daily.   No facility-administered encounter medications on file as of 07/09/2021.     ONCOLOGIC FAMILY HISTORY:  Family History  Problem Relation Age of Onset   Diabetes Mother    Stroke Mother    Diabetes Brother        ?   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    Comment: 05/2008  Tobacco Use   Smoking status: Former    Packs/day: 2.00    Years: 35.00    Pack years: 70.00    Types: Cigarettes    Quit date: 04/15/1990    Years since quitting: 31.2   Smokeless tobacco: Never  Vaping Use   Vaping Use: Never used  Substance and Sexual Activity   Alcohol use: Not Currently    Comment: no ETOH since the 90s   Drug use: Never   Sexual activity: Not Currently  Other Topics Concern   Not on file  Social History Narrative   Lives w/ wife   3 children, 8 g-kids             Social Determinants of Health   Financial Resource Strain: Not on file  Food Insecurity: Not on file  Transportation Needs: Not on file  Physical Activity: Not on file  Stress: Not on file  Social Connections: Not on file  Intimate Partner Violence: Not on file     OBSERVATIONS/OBJECTIVE:  BP 129/68 (BP Location: Left Arm, Patient Position: Sitting)    Pulse 64    Temp 97.7 F (36.5 C) (Temporal)    Resp 16    Wt 185 lb (83.9 kg)    SpO2 97%    BMI 28.13 kg/m  GENERAL: Patient is a well appearing male in no acute distress HEENT:  Sclerae anicteric.  Mask in place. Neck is supple.  NODES:  No cervical, supraclavicular, or axillary lymphadenopathy palpated.  LUNGS:  Clear to auscultation bilaterally.  No wheezes or rhonchi. HEART:  Regular rate and rhythm. No murmur appreciated. ABDOMEN:  Soft, nontender.  Positive, normoactive bowel sounds. No organomegaly palpated. MSK:  No focal spinal tenderness to palpation.  EXTREMITIES:  No peripheral edema.   SKIN:  Clear with no obvious rashes or skin changes.  NEURO:  Nonfocal. Well oriented.  Appropriate affect.  LABORATORY DATA:  None for  this visit.  DIAGNOSTIC IMAGING:  None for this visit.      ASSESSMENT AND PLAN:  Mr.. Hamblin is a pleasant 76 y.o. here today  for follow-up of his prostate cancer and to discuss his survivorship care plan.  1. Prostate Cancer:  Mr. Fabro is here today for follow-up of his prostate cancer.  He will follow-up with Dr. Jeffie Pollock in urology  for PSA checks every 6 months.  This is due in July 2023.  Today, a comprehensive survivorship care plan and treatment summary was reviewed with the patient today detailing his prostate cancer diagnosis, treatment course, potential late/long-term effects of treatment, appropriate follow-up care with recommendations for the future, and patient education resources.  A copy of this summary, along with a letter will be sent to the patients primary care provider via mail/fax/In Basket message after todays visit.    2.  Nocturia: This is stable and likely secondary to his increased water intake.  He has no signs of infection today.  3. Bone health:  He was given education on specific activities to promote bone health.  4. Cancer screening:  Due to Mr. Thede's history and age he should receive colon cancer and skin cancer screening.  The information and recommendations are listed on the patient's comprehensive care plan/treatment summary and were reviewed in detail with the patient.    5. Health maintenance and wellness promotion: Mr. Mcconathy was encouraged to consume 5-7 servings of fruits and vegetables per day. We reviewed the "Nutrition Rainbow" handout.  He was also encouraged to engage in moderate to vigorous exercise for 30 minutes per day most days of the week. We discussed the [employer] fitness program, which is designed for cancer survivors to help them become more physically fit after cancer treatments.  he was instructed to limit her alcohol consumption and continue to abstain from tobacco use.     6. Support services/counseling: It is not  uncommon for this period of the patient's cancer care trajectory to be one of many emotions and stressors.   He was given information regarding our available services and encouraged to contact me with any questions or for help enrolling in any of our support group/programs.    Follow up instructions:    -Return to cancer center as needed -Follow up with urology in 6 months  The patient was provided an opportunity to ask questions and all were answered. The patient agreed with the plan and demonstrated an understanding of the instructions.   Total encounter time: 50 minutes in face-to-face visit time, chart review, lab review, care coordination, order entry, and documentation of the encounter.  Wilber Bihari, NP 07/09/21 10:11 AM Medical Oncology and Hematology [facility], [place] [address] Tel. [phone]    Fax. [phone]  *Total Encounter Time as defined by the Centers for Medicare and Medicaid Services includes, in addition to the face-to-face time of a patient visit (documented in the note above) non-face-to-face time: obtaining and reviewing outside history, ordering and reviewing medications, tests or procedures, care coordination (communications with other health care professionals or caregivers) and documentation in the medical record.

## 2021-07-20 ENCOUNTER — Other Ambulatory Visit: Payer: Self-pay | Admitting: Physician Assistant

## 2021-07-20 NOTE — Telephone Encounter (Signed)
Next Visit: 10/18/2021  Last Visit: 05/17/2021  Last Fill: 04/26/2021  DX: Seronegative rheumatoid arthritis   Current Dose per office note 05/17/2021: Methotrexate 4 tablets by mouth once weekly   Labs: 05/17/2021 Lymphocyte count is low due to methotrexate use.  Creatinine is mildly elevated. 06/22/2021 Kidney function test is normal now.  Okay to refill MTX?

## 2021-08-02 ENCOUNTER — Other Ambulatory Visit: Payer: Self-pay | Admitting: Internal Medicine

## 2021-08-17 ENCOUNTER — Other Ambulatory Visit: Payer: Self-pay

## 2021-08-17 DIAGNOSIS — Z79899 Other long term (current) drug therapy: Secondary | ICD-10-CM

## 2021-08-18 LAB — COMPLETE METABOLIC PANEL WITH GFR
AG Ratio: 1.7 (calc) (ref 1.0–2.5)
ALT: 21 U/L (ref 9–46)
AST: 27 U/L (ref 10–35)
Albumin: 4.4 g/dL (ref 3.6–5.1)
Alkaline phosphatase (APISO): 72 U/L (ref 35–144)
BUN: 15 mg/dL (ref 7–25)
CO2: 28 mmol/L (ref 20–32)
Calcium: 9.3 mg/dL (ref 8.6–10.3)
Chloride: 102 mmol/L (ref 98–110)
Creat: 1.26 mg/dL (ref 0.70–1.28)
Globulin: 2.6 g/dL (calc) (ref 1.9–3.7)
Glucose, Bld: 82 mg/dL (ref 65–99)
Potassium: 4.5 mmol/L (ref 3.5–5.3)
Sodium: 138 mmol/L (ref 135–146)
Total Bilirubin: 0.9 mg/dL (ref 0.2–1.2)
Total Protein: 7 g/dL (ref 6.1–8.1)
eGFR: 59 mL/min/{1.73_m2} — ABNORMAL LOW (ref >=60)

## 2021-08-18 LAB — CBC WITH DIFFERENTIAL/PLATELET
Absolute Monocytes: 737 cells/uL (ref 200–950)
Basophils Absolute: 21 cells/uL (ref 0–200)
Basophils Relative: 0.4 %
Eosinophils Absolute: 69 cells/uL (ref 15–500)
Eosinophils Relative: 1.3 %
HCT: 41.8 % (ref 38.5–50.0)
Hemoglobin: 13.7 g/dL (ref 13.2–17.1)
Lymphs Abs: 1208 cells/uL (ref 850–3900)
MCH: 32.2 pg (ref 27.0–33.0)
MCHC: 32.8 g/dL (ref 32.0–36.0)
MCV: 98.1 fL (ref 80.0–100.0)
MPV: 9.4 fL (ref 7.5–12.5)
Monocytes Relative: 13.9 %
Neutro Abs: 3265 cells/uL (ref 1500–7800)
Neutrophils Relative %: 61.6 %
Platelets: 241 10*3/uL (ref 140–400)
RBC: 4.26 10*6/uL (ref 4.20–5.80)
RDW: 13.1 % (ref 11.0–15.0)
Total Lymphocyte: 22.8 %
WBC: 5.3 10*3/uL (ref 3.8–10.8)

## 2021-08-19 NOTE — Progress Notes (Signed)
CBC is normal, CMP is normal except GFR is low and stable.  Most likely due to the use of methotrexate and diuretics.  We will continue to monitor labs every 3 months.

## 2021-09-06 ENCOUNTER — Other Ambulatory Visit: Payer: Self-pay | Admitting: Rheumatology

## 2021-10-04 NOTE — Progress Notes (Signed)
? ?Office Visit Note ? ?Patient: Eric [name]             ?Date of Birth: 10/05/1945           ?MRN: [id number]             ?PCP: Colon Branch, MD ?Referring: Colon Branch, MD ?Visit Date: 10/18/2021 ?Occupation: '@GUAROCC'$ @ ? ?Subjective:  ?Medication monitoring ? ?History of Present Illness: Eric [name] is a 76 y.o. male with history of seronegative rheumatoid arthritis and osteoarthritis.  Patient is currently taking methotrexate 4 tablets by mouth once weekly and folic acid 1 mg daily.  He continues to tolerate methotrexate without any side effects and has not missed any doses recently.  He states that he experiences fatigue about 24 to 36 hours after taking his weekly methotrexate dose.  He denies any signs or symptoms of a rheumatoid arthritis flare.  He experiences occasional arthralgias and joint joint stiffness which is typically aggravated by overuse activities. ?He denies any recent infections. ? ? ? ? ?Activities of Daily Living:  ?Patient reports morning stiffness for 0 minutes.   ?Patient Denies nocturnal pain.  ?Difficulty dressing/grooming: Reports ?Difficulty climbing stairs: Denies ?Difficulty getting out of chair: Reports ?Difficulty using hands for taps, buttons, cutlery, and/or writing: Reports ? ?Review of Systems  ?Constitutional:  Negative for fatigue.  ?HENT:  Negative for mouth sores, mouth dryness and nose dryness.   ?Eyes:  Negative for pain, itching and dryness.  ?Respiratory:  Positive for shortness of breath. Negative for difficulty breathing.   ?Cardiovascular:  Negative for chest pain and palpitations.  ?Gastrointestinal:  Negative for blood in stool, constipation and diarrhea.  ?Endocrine: Negative for increased urination.  ?Genitourinary:  Negative for difficulty urinating.  ?Musculoskeletal:  Positive for myalgias and myalgias. Negative for joint pain, joint pain, joint swelling, morning stiffness and muscle tenderness.  ?Skin:  Negative for color change, rash and redness.   ?Allergic/Immunologic: Negative for susceptible to infections.  ?Neurological:  Positive for light-headedness, memory loss and weakness. Negative for dizziness, numbness and headaches.  ?Hematological:  Positive for bruising/bleeding tendency.  ?Psychiatric/Behavioral:  Negative for confusion.   ? ?PMFS History:  ?Patient Active Problem List  ? Diagnosis Date Noted  ? Malignant neoplasm of prostate ([place]) 10/24/2020  ? Lumbar stenosis   ? Heart murmur   ? GERD (gastroesophageal reflux disease)   ? Dyspnea   ? Cataract   ? Arthritis   ? Anxiety   ? Body mass index (BMI) 30.0-30.9, adult 06/23/2019  ? Subdural hematoma ([place]) 05/05/2019  ? Subarachnoid hemorrhage ([place]) 04/28/2019  ? History of COVID-19 04/23/2019  ? Rheumatoid arthritis ([place]) 11/17/2018  ? Spinal stenosis of lumbar region 04/23/2018  ? HNP (herniated nucleus pulposus), lumbar 04/23/2018  ? Prolapsed lumbar disc 03/03/2018  ? Wide-complex tachycardia 12/30/2017  ? Left bundle branch block (LBBB) 12/18/2017  ? 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 12/17/2017  ? Lower urinary tract symptoms (LUTS) 12/2017  ? Abnormal LFTs   ? Macular edema   ? PCP NOTES >>>>>> 04/27/2015  ? Hyperglycemia 10/20/2014  ? Dizziness 04/21/2014  ? Annual physical exam 04/15/2011  ? Aortic stenosis 04/15/2011  ? History of aortic valve stenosis 2011  ? Hypothyroidism 10/13/2007  ? Hyperlipidemia 10/13/2007  ? Hypertension 04/08/2007  ?  ?Past Medical History:  ?Diagnosis Date  ? Full dentures   ? Heart murmur   ? History of aortic valve stenosis 2011  ? 06/ 2011 dx mild bicuspid stenosis;  then 04/ 2019 severe  stenosis  ? History of COVID-19 04/23/2019  ? positive result in epic;  per pt asymptomatic  ? History of subarachnoid hemorrhage 04/27/2019  ? hospital admission in epic, dx bilateral frontal SAH w/ left occipital skull fx nondisplaced due to syncope w/ fall secondary to dehydration/ ortho hypotension  ? History of supraventricular  tachycardia 12/2017  ? followed by cardiology  ? Hyperlipidemia   ? Hypertension   ? followed by cardiology and pcp  ? Hypothyroidism   ? followed by pcp  ? Left bundle branch block (LBBB) 12/18/2017  ? post op AV replacement 12-17-2017  ? Lower urinary tract symptoms (LUTS)   ? Macular edema of right eye   ? Dr  Sherlynn Stalls   ? OA (osteoarthritis)   ? multiple sites , followed by dr s. Estanislado Pandy  ? Prostate cancer ([place])   ? urologist--- dr Jeffie Pollock--- dx 04/ 2022, Gleason 3+4, PSA 3.76  ? Rheumatoid arthritis ([place]) 11/17/2018  ? rheumotologist-- dr s. Estanislado Pandy, takes methotrexate  ? 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 12/17/2017  ? [employer] rapid deployment stented bovine pericardial tissue valve via right mini thoracotomy approach  ? SOB (shortness of breath)   ? per pt occasionally with yard work, but ok with stairs and normal activities  ? Spinal stenosis of lumbar region 04/23/2018  ? Wide-complex tachycardia 12/30/2017  ? (followed by dr Bettina Gavia)  post cardioversion by EMS 12-30-2017 for SVT 170s post op AV replacement 12-17-2017 , CHF, AKI  ?  ?Family History  ?Problem Relation Age of Onset  ? Diabetes Mother   ? Stroke Mother   ? Diabetes Brother   ?     ?  ? Coronary artery disease Brother   ?     Male 1st degree relative >50, had CABG in his 72s  ? Emphysema Father   ? COPD Father   ? Diabetes Daughter   ? Colon cancer Neg Hx   ? Prostate cancer Neg Hx   ? Colon polyps Neg Hx   ? Esophageal cancer Neg Hx   ? Rectal cancer Neg Hx   ? Stomach cancer Neg Hx   ? ?Past Surgical History:  ?Procedure Laterality Date  ? AORTIC VALVE REPLACEMENT N/A 12/17/2017  ? Procedure: MINIMALLY INVASIVE AORTIC VALVE REPLACEMENT (AVR) [ using [employer] Intuity Valve Size 67m;  Surgeon: ORexene Alberts MD;  Location: [facility]  Service: Open Heart Surgery;  Laterality: N/A;  MINI THORACOTOMY  ? CARDIAC CATHETERIZATION  11/10/2000  ? '@[facility]'$  by dr kCaryl Comes  normal coronary angiography and  normal LVF  ? CATARACT EXTRACTION W/ INTRAOCULAR LENS IMPLANT Right 2018  ? COLONOSCOPY    ? last one 02-29-2020 by dr h. danis  ? CYSTOSCOPY N/A 02/09/2021  ? Procedure: CYSTOSCOPY;  Surgeon: WIrine Seal MD;  [facility]  Service: Urology;  Laterality: N/A;  ? INGUINAL HERNIA REPAIR Right 2008  ? LAPAROSCOPIC CHOLECYSTECTOMY  06/26/2001  ? '@[facility]'$ ;  WITH OPEN LEFT INGUINAL HERNIA REPAIR  ? LUMBAR LAMINECTOMY/DECOMPRESSION MICRODISCECTOMY N/A 04/23/2018  ? Procedure: Microlumbar decompression Lumbar five-Sacral one;  Surgeon: BSusa Day MD;  Location: [place]  Service: Orthopedics;  Laterality: N/A;  ? PROSTATE BIOPSY  09/20/2020  ? RADIOACTIVE SEED IMPLANT N/A 02/09/2021  ? Procedure: RADIOACTIVE SEED IMPLANT/BRACHYTHERAPY IMPLANT;  Surgeon: WIrine Seal MD;  [facility]  Service: Urology;  Laterality: N/A;  56 seeds  ? RIGHT/LEFT HEART CATH AND CORONARY  ANGIOGRAPHY N/A 10/16/2017  ? Procedure: RIGHT/LEFT HEART CATH AND CORONARY ANGIOGRAPHY;  Surgeon: [name], MD;  Location: [place] [facility];  Service: Cardiovascular;  Laterality: N/A;  ? SPACE OAR INSTILLATION N/A 02/09/2021  ? Procedure: SPACE OAR INSTILLATION;  Surgeon: Irine Seal, MD;  [facility];  Service: Urology;  Laterality: N/A;  ? TEE WITHOUT CARDIOVERSION N/A 12/17/2017  ? Procedure: TRANSESOPHAGEAL ECHOCARDIOGRAM (TEE);  Surgeon: Rexene Alberts, MD;  Location: [place];  Service: Open Heart Surgery;  Laterality: N/A;  ? TONSILLECTOMY  1960  ? ?Social History  ? ?Social History Narrative  ? Lives w/ wife  ? 3 children, 8 g-kids   ?   ?   ?   ? ?Immunization History  ?Administered Date(s) Administered  ? Fluad Quad(high Dose 65+) 02/12/2019, 03/14/2020, 05/15/2021  ? Influenza Split 03/11/2013, 02/17/2014  ? Influenza Whole 04/13/2007, 03/15/2008, 03/10/2012  ? Influenza, High Dose Seasonal PF 04/04/2015, 03/28/2016, 03/18/2017, 03/16/2018  ? PFIZER(Purple Top)SARS-COV-2  Vaccination 08/08/2019, 08/31/2019, 03/24/2020  ? PNEUMOCOCCAL CONJUGATE-20 06/26/2021  ? Pneumococcal Conjugate-13 04/21/2014  ? Pneumococcal Polysaccharide-23 04/15/2012  ? Td 06/10/1996, 06/10/2000  ? Tdap 1

## 2021-10-18 ENCOUNTER — Encounter: Payer: Self-pay | Admitting: Physician Assistant

## 2021-10-18 ENCOUNTER — Ambulatory Visit: Payer: Medicare Other | Admitting: Physician Assistant

## 2021-10-18 VITALS — BP 114/73 | HR 64 | Ht 68.0 in | Wt 193.4 lb

## 2021-10-18 DIAGNOSIS — Z840 Family history of diseases of the skin and subcutaneous tissue: Secondary | ICD-10-CM

## 2021-10-18 DIAGNOSIS — M19072 Primary osteoarthritis, left ankle and foot: Secondary | ICD-10-CM

## 2021-10-18 DIAGNOSIS — M19071 Primary osteoarthritis, right ankle and foot: Secondary | ICD-10-CM

## 2021-10-18 DIAGNOSIS — M48061 Spinal stenosis, lumbar region without neurogenic claudication: Secondary | ICD-10-CM

## 2021-10-18 DIAGNOSIS — Z953 Presence of xenogenic heart valve: Secondary | ICD-10-CM

## 2021-10-18 DIAGNOSIS — M19042 Primary osteoarthritis, left hand: Secondary | ICD-10-CM

## 2021-10-18 DIAGNOSIS — Z79899 Other long term (current) drug therapy: Secondary | ICD-10-CM

## 2021-10-18 DIAGNOSIS — Z8639 Personal history of other endocrine, nutritional and metabolic disease: Secondary | ICD-10-CM

## 2021-10-18 DIAGNOSIS — C61 Malignant neoplasm of prostate: Secondary | ICD-10-CM

## 2021-10-18 DIAGNOSIS — I10 Essential (primary) hypertension: Secondary | ICD-10-CM

## 2021-10-18 DIAGNOSIS — M8589 Other specified disorders of bone density and structure, multiple sites: Secondary | ICD-10-CM

## 2021-10-18 DIAGNOSIS — M170 Bilateral primary osteoarthritis of knee: Secondary | ICD-10-CM | POA: Diagnosis not present

## 2021-10-18 DIAGNOSIS — M0600 Rheumatoid arthritis without rheumatoid factor, unspecified site: Secondary | ICD-10-CM | POA: Diagnosis not present

## 2021-10-18 DIAGNOSIS — I447 Left bundle-branch block, unspecified: Secondary | ICD-10-CM

## 2021-10-18 DIAGNOSIS — Z8659 Personal history of other mental and behavioral disorders: Secondary | ICD-10-CM

## 2021-10-18 DIAGNOSIS — M19041 Primary osteoarthritis, right hand: Secondary | ICD-10-CM | POA: Diagnosis not present

## 2021-10-18 NOTE — Patient Instructions (Signed)
Standing Labs ?We placed an order today for your standing lab work.  ? ?Please have your standing labs drawn in June and every 3 months  ? ?If possible, please have your labs drawn 2 weeks prior to your appointment so that the provider can discuss your results at your appointment. ? ?Please note that you may see your imaging and lab results in MyChart before we have reviewed them. ?We may be awaiting multiple results to interpret others before contacting you. ?Please allow our office up to 72 hours to thoroughly review all of the results before contacting the office for clarification of your results. ? ?We have open lab daily: ?Monday through Thursday from 1:30-4:30 PM and Friday from 1:30-4:00 PM ?at the office of Dr. Shaili Deveshwar, [facility].   ?Please be advised, all patients with office appointments requiring lab work will take precedent over walk-in lab work.  ?If possible, please come for your lab work on Monday and Friday afternoons, as you may experience shorter wait times. ?The office is located at [address], [address], [place], [place] [address] ?No appointment is necessary.   ?Labs are drawn by [facility]. Please bring your co-pay at the time of your lab draw.  You may receive a bill from [facility] for your lab work. ? ?Please note if you are on Hydroxychloroquine and and an order has been placed for a Hydroxychloroquine level, you will need to have it drawn 4 hours or more after your last dose. ? ?If you wish to have your labs drawn at another location, please call the office 24 hours in advance to send orders. ? ?If you have any questions regarding directions or hours of operation,  ?please call [phone].   ?As a reminder, please drink plenty of water prior to coming for your lab work. Thanks! ? ?

## 2021-11-19 ENCOUNTER — Other Ambulatory Visit: Payer: Self-pay | Admitting: *Deleted

## 2021-11-19 ENCOUNTER — Other Ambulatory Visit: Payer: Self-pay | Admitting: Physician Assistant

## 2021-11-19 ENCOUNTER — Other Ambulatory Visit: Payer: Self-pay | Admitting: Rheumatology

## 2021-11-19 ENCOUNTER — Other Ambulatory Visit: Payer: Self-pay | Admitting: Internal Medicine

## 2021-11-19 DIAGNOSIS — Z79899 Other long term (current) drug therapy: Secondary | ICD-10-CM

## 2021-11-19 NOTE — Telephone Encounter (Signed)
Next Visit: 03/20/2022  Last Visit: 10/18/2021  Last Fill: 07/20/2021  DX: Seronegative rheumatoid arthritis   Current Dose per office note 10/18/2021: Methotrexate 4 tablets by mouth once weekly   Labs: 08/17/2021 CBC is normal, CMP is normal except GFR is low and stable.  Patient updated labs today.   Okay to refill MTX?

## 2021-11-19 NOTE — Telephone Encounter (Signed)
Next Visit: 03/20/2022  Last Visit: 10/18/2021  Last Fill: 03/27/2021  DX: Seronegative rheumatoid arthritis   Current Dose per office note 1/49/9692: folic acid 1 mg po daily.   Okay to refill Folic Acid?

## 2021-11-20 LAB — CBC WITH DIFFERENTIAL/PLATELET
Absolute Monocytes: 536 cells/uL (ref 200–950)
Basophils Absolute: 10 cells/uL (ref 0–200)
Basophils Relative: 0.2 %
Eosinophils Absolute: 57 cells/uL (ref 15–500)
Eosinophils Relative: 1.1 %
HCT: 37.7 % — ABNORMAL LOW (ref 38.5–50.0)
Hemoglobin: 12.8 g/dL — ABNORMAL LOW (ref 13.2–17.1)
Lymphs Abs: 1097 cells/uL (ref 850–3900)
MCH: 33.4 pg — ABNORMAL HIGH (ref 27.0–33.0)
MCHC: 34 g/dL (ref 32.0–36.0)
MCV: 98.4 fL (ref 80.0–100.0)
MPV: 9.8 fL (ref 7.5–12.5)
Monocytes Relative: 10.3 %
Neutro Abs: 3500 cells/uL (ref 1500–7800)
Neutrophils Relative %: 67.3 %
Platelets: 185 10*3/uL (ref 140–400)
RBC: 3.83 10*6/uL — ABNORMAL LOW (ref 4.20–5.80)
RDW: 13.4 % (ref 11.0–15.0)
Total Lymphocyte: 21.1 %
WBC: 5.2 10*3/uL (ref 3.8–10.8)

## 2021-11-20 LAB — COMPLETE METABOLIC PANEL WITH GFR
AG Ratio: 1.8 (calc) (ref 1.0–2.5)
ALT: 22 U/L (ref 9–46)
AST: 26 U/L (ref 10–35)
Albumin: 4.2 g/dL (ref 3.6–5.1)
Alkaline phosphatase (APISO): 63 U/L (ref 35–144)
BUN: 19 mg/dL (ref 7–25)
CO2: 24 mmol/L (ref 20–32)
Calcium: 9.2 mg/dL (ref 8.6–10.3)
Chloride: 105 mmol/L (ref 98–110)
Creat: 1.2 mg/dL (ref 0.70–1.28)
Globulin: 2.4 g/dL (calc) (ref 1.9–3.7)
Glucose, Bld: 117 mg/dL — ABNORMAL HIGH (ref 65–99)
Potassium: 4.5 mmol/L (ref 3.5–5.3)
Sodium: 138 mmol/L (ref 135–146)
Total Bilirubin: 0.8 mg/dL (ref 0.2–1.2)
Total Protein: 6.6 g/dL (ref 6.1–8.1)
eGFR: 63 mL/min/{1.73_m2} (ref >=60)

## 2021-11-20 NOTE — Progress Notes (Signed)
Hemoglobin is low.  Glucose is mildly elevated, probably not a fasting sample.  Please advise patient to take multivitamin with iron.

## 2021-12-13 ENCOUNTER — Encounter: Payer: Self-pay | Admitting: Internal Medicine

## 2021-12-24 ENCOUNTER — Ambulatory Visit: Payer: Medicare Other | Admitting: Internal Medicine

## 2021-12-24 ENCOUNTER — Encounter: Payer: Self-pay | Admitting: Internal Medicine

## 2021-12-24 VITALS — BP 122/70 | HR 76 | Temp 97.8°F | Resp 18 | Ht 68.0 in | Wt 194.0 lb

## 2021-12-24 DIAGNOSIS — R739 Hyperglycemia, unspecified: Secondary | ICD-10-CM

## 2021-12-24 DIAGNOSIS — I10 Essential (primary) hypertension: Secondary | ICD-10-CM

## 2021-12-24 DIAGNOSIS — E038 Other specified hypothyroidism: Secondary | ICD-10-CM

## 2021-12-24 LAB — TSH: TSH: 2.06 u[IU]/mL (ref 0.35–5.50)

## 2021-12-24 LAB — HEMOGLOBIN A1C: Hgb A1c MFr Bld: 6 % (ref 4.6–6.5)

## 2021-12-24 MED ORDER — SHINGRIX 50 MCG/0.5ML IM SUSR: 0.5000 mL | Freq: Once | INTRAMUSCULAR | 1 refills | Status: AC

## 2021-12-24 NOTE — Patient Instructions (Addendum)
Recommend to proceed with the following vaccines at your pharmacy:  Shingrix (shingles)- see printed prescription Covid booster (bivalent) Flu shot this fall   Check the  blood pressure regularly BP GOAL is between 110/65 and  135/85. If it is consistently higher or lower, let me know   GO TO THE LAB : Get the blood work     [place], [place] back for   a physical exam by 06-2022

## 2021-12-24 NOTE — Assessment & Plan Note (Signed)
Routine visit HTN: On amlodipine, carvedilol, Aldactone.  Last BMP okay.  Ambulatory BPs normal.  No change Hypothyroidism: On Synthroid, checking labs. RA: On MTX, aches and pains at baseline.  Sees rheumatology regularly. Cardiovascular: On aspirin, statins.  To see cardiology in few months Prostate cancer: Last seen by oncology and urology January 2023, urology plans to see q. 6 months. Preventive care: Benefits of Shingrix, COVID-vaccine and flu shot discussed. RTC 06-2022 for CPE

## 2021-12-24 NOTE — Progress Notes (Signed)
Subjective:    Patient ID: Eric [name], male    DOB: 1946-04-21, 76 y.o.   MRN: [id number]  DOS:  12/24/2021  Type of visit - description: Follow-up  Since the last office visit he is doing well. History of rheumatoid arthritis, aches and pains at baseline. Good med compliance Ambulatory BP normal Denies chest pain or difficulty breathing.  No lower extremity edema. Denies nausea vomiting.  No blood in the stools.  No dysuria or gross hematuria  Review of Systems See above   Past Medical History:  Diagnosis Date   Full dentures    Heart murmur    History of aortic valve stenosis 2011   06/ 2011 dx mild bicuspid stenosis;    then 04/ 2019 severe  stenosis   History of COVID-19 04/23/2019   positive result in epic;  per pt asymptomatic   History of subarachnoid hemorrhage 04/27/2019   hospital admission in epic, dx bilateral frontal SAH w/ left occipital skull fx nondisplaced due to syncope w/ fall secondary to dehydration/ ortho hypotension   History of supraventricular tachycardia 12/2017   followed by cardiology   Hyperlipidemia    Hypertension    followed by cardiology and pcp   Hypothyroidism    followed by pcp   Left bundle branch block (LBBB) 12/18/2017   post op AV replacement 12-17-2017   Lower urinary tract symptoms (LUTS)    Macular edema of right eye    Dr  Sherlynn Stalls    OA (osteoarthritis)    multiple sites , followed by dr s. Estanislado Pandy   Prostate cancer [facility])    urologist--- dr Jeffie Pollock--- dx 04/ 2022, Gleason 3+4, PSA 3.76   Rheumatoid arthritis ([place]) 11/17/2018   rheumotologist-- dr s. 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 12/17/2017   Edwards Intuity Elite rapid deployment stented bovine pericardial tissue valve via right mini thoracotomy approach   SOB (shortness of breath)    per pt occasionally with yard work, but ok with stairs and normal activities   Spinal stenosis of lumbar  region 04/23/2018   Wide-complex tachycardia 12/30/2017   (followed by dr Bettina Gavia)  post cardioversion by EMS 12-30-2017 for SVT 170s post op AV replacement 12-17-2017 , CHF, AKI    Past Surgical History:  Procedure Laterality Date   AORTIC VALVE REPLACEMENT N/A 12/17/2017   Procedure: MINIMALLY INVASIVE AORTIC VALVE REPLACEMENT (AVR) [ using Edwards Intuity Valve Size 23m;  Surgeon: ORexene Alberts MD;  [facility];  Service: Open Heart Surgery;  Laterality: N/A;  MINI THORACOTOMY   CARDIAC CATHETERIZATION  11/10/2000   '@[facility]'$  by dr kCaryl Comes  normal coronary angiography and normal LVF   CATARACT EXTRACTION W/ INTRAOCULAR LENS IMPLANT Right 2018   COLONOSCOPY     last one 02-29-2020 by dr h. danis   CYSTOSCOPY N/A 02/09/2021   Procedure: CConsuela Mimes  Surgeon: WIrine Seal MD;  [facility]  Service: Urology;  Laterality: N/A;   INGUINAL HERNIA REPAIR Right 2008   LAPAROSCOPIC CHOLECYSTECTOMY  06/26/2001   '@[facility]'$ ;  WITH OPEN LEFT INGUINAL HERNIA REPAIR   LUMBAR LAMINECTOMY/DECOMPRESSION MICRODISCECTOMY N/A 04/23/2018   Procedure: Microlumbar decompression Lumbar five-Sacral one;  Surgeon: BSusa Day MD;  [facility]  Service: Orthopedics;  Laterality: N/A;   PROSTATE BIOPSY  09/20/2020   RADIOACTIVE SEED IMPLANT N/A 02/09/2021   Procedure: RADIOACTIVE SEED IMPLANT/BRACHYTHERAPY IMPLANT;  Surgeon: WIrine Seal MD;  [facility]  Service: Urology;  Laterality: N/A;  56 seeds   RIGHT/LEFT HEART CATH AND CORONARY ANGIOGRAPHY N/A 10/16/2017   Procedure: RIGHT/LEFT HEART CATH AND CORONARY ANGIOGRAPHY;  Surgeon: [name], MD;  Location: [place] [facility];  Service: Cardiovascular;  Laterality: N/A;   SPACE OAR INSTILLATION N/A 02/09/2021   Procedure: SPACE OAR INSTILLATION;  Surgeon: Irine Seal, MD;  Location: [facility];  Service: Urology;  Laterality: N/A;   TEE WITHOUT CARDIOVERSION N/A 12/17/2017   Procedure:  TRANSESOPHAGEAL ECHOCARDIOGRAM (TEE);  Surgeon: Rexene Alberts, MD;  Location: [place];  Service: Open Heart Surgery;  Laterality: N/A;   TONSILLECTOMY  1960    Current Outpatient Medications  Medication Instructions   amLODipine (NORVASC) 5 mg, Oral, Daily   aspirin EC 81 mg, Oral, Daily   benzonatate (TESSALON) 200 mg, Oral, 3 times daily PRN   carvedilol (COREG) 12.5 mg, Oral, 2 times daily with meals   Cholecalciferol (VITAMIN D-3 PO) 2,000 Units, Oral, Daily   folic acid (FOLVITE) 1 MG tablet TAKE 1 TABLET BY MOUTH EVERY DAY   levothyroxine (SYNTHROID) 88 mcg, Oral, Daily before breakfast   methotrexate (RHEUMATREX) 10 mg, Oral, Weekly   Multiple Vitamins-Iron (MULTIVITAMINS WITH IRON) TABS tablet 1 tablet, Oral, Daily   rosuvastatin (CRESTOR) 10 mg, Oral, Daily at bedtime   spironolactone (ALDACTONE) 25 mg, Oral, Daily   Zoster Vaccine Adjuvanted (SHINGRIX) injection 0.5 mLs, Intramuscular,  Once       Objective:   Physical Exam BP 122/70   Pulse 76   Temp 97.8 F (36.6 C) (Oral)   Resp 18   Ht '5\' 8"'$  (1.727 m)   Wt 194 lb (88 kg)   SpO2 97%   BMI 29.50 kg/m  General:   Well developed, NAD, BMI noted. HEENT:  Normocephalic . Face symmetric, atraumatic Lungs:  CTA B Normal respiratory effort, no intercostal retractions, no accessory muscle use. Heart: RRR,  no murmur.  Lower extremities: no pretibial edema bilaterally  Skin: Not pale. Not jaundice Neurologic:  alert & oriented X3.  Speech normal, gait appropriate for age and unassisted Psych--  Cognition and judgment appear intact.  Cooperative with normal attention span and concentration.  Behavior appropriate. No anxious or depressed appearing.      Assessment     Assessment HTN Hyperlipidemia Hypothyroidism R.A. dx 2020 DJD Increase LFTs Ultrasound 2002 done for increased LFTs: Normal liver; Hep C negative 02-16-15 CV: --Aortic stenosis , dx  2012 , s/p AoVR 12/2017 --Fusiform infrarenal abdominal  aortic ectasis: -- FF,  Brother has CAD dx age 43s --Patient himself has a cardiac catheterization with minimal to no disease 10/2017 Macular degeneration, cataracts  Subarachnoid hemorrhage, left orbital contusion, left occipital fracture 2020 Prostate cancer, Dx 09-2020 COVID-19 infection 04/2019  PLAN Routine visit HTN: On amlodipine, carvedilol, Aldactone.  Last BMP okay.  Ambulatory BPs normal.  No change Hypothyroidism: On Synthroid, checking labs. RA: On MTX, aches and pains at baseline.  Sees rheumatology regularly. Cardiovascular: On aspirin, statins.  To see cardiology in few months Prostate cancer: Last seen by oncology and urology January 2023, urology plans to see q. 6 months. Preventive care: Benefits of Shingrix, COVID-vaccine and flu shot discussed. RTC 06-2022 for CPE

## 2021-12-26 LAB — PSA: PSA: 0.147

## 2022-01-03 ENCOUNTER — Encounter: Payer: Self-pay | Admitting: Internal Medicine

## 2022-01-17 ENCOUNTER — Encounter (HOSPITAL_COMMUNITY)
Admission: EM | Disposition: A | Discharge status: Home Health | Payer: Self-pay | Source: Home / Self Care | Attending: Pulmonary Disease

## 2022-01-17 ENCOUNTER — Emergency Department (HOSPITAL_COMMUNITY): Payer: Medicare Other

## 2022-01-17 ENCOUNTER — Inpatient Hospital Stay (HOSPITAL_COMMUNITY)
Admission: EM | Admit: 2022-01-17 | Discharge: 2022-01-25 | DRG: 242 | Disposition: A | Discharge status: Home Health | Payer: Medicare Other | Attending: Internal Medicine | Admitting: Internal Medicine

## 2022-01-17 DIAGNOSIS — Z7982 Long term (current) use of aspirin: Secondary | ICD-10-CM

## 2022-01-17 DIAGNOSIS — Z79890 Hormone replacement therapy (postmenopausal): Secondary | ICD-10-CM

## 2022-01-17 DIAGNOSIS — N179 Acute kidney failure, unspecified: Secondary | ICD-10-CM | POA: Diagnosis present

## 2022-01-17 DIAGNOSIS — Z8546 Personal history of malignant neoplasm of prostate: Secondary | ICD-10-CM | POA: Diagnosis not present

## 2022-01-17 DIAGNOSIS — M159 Polyosteoarthritis, unspecified: Secondary | ICD-10-CM | POA: Diagnosis present

## 2022-01-17 DIAGNOSIS — I442 Atrioventricular block, complete: Principal | ICD-10-CM | POA: Diagnosis present

## 2022-01-17 DIAGNOSIS — J9601 Acute respiratory failure with hypoxia: Secondary | ICD-10-CM | POA: Diagnosis present

## 2022-01-17 DIAGNOSIS — I469 Cardiac arrest, cause unspecified: Secondary | ICD-10-CM | POA: Diagnosis not present

## 2022-01-17 DIAGNOSIS — Z79899 Other long term (current) drug therapy: Secondary | ICD-10-CM | POA: Diagnosis not present

## 2022-01-17 DIAGNOSIS — S0081XA Abrasion of other part of head, initial encounter: Secondary | ICD-10-CM | POA: Diagnosis present

## 2022-01-17 DIAGNOSIS — I10 Essential (primary) hypertension: Secondary | ICD-10-CM | POA: Diagnosis present

## 2022-01-17 DIAGNOSIS — E039 Hypothyroidism, unspecified: Secondary | ICD-10-CM | POA: Diagnosis present

## 2022-01-17 DIAGNOSIS — Z8616 Personal history of COVID-19: Secondary | ICD-10-CM

## 2022-01-17 DIAGNOSIS — W19XXXA Unspecified fall, initial encounter: Secondary | ICD-10-CM | POA: Diagnosis present

## 2022-01-17 DIAGNOSIS — R570 Cardiogenic shock: Secondary | ICD-10-CM

## 2022-01-17 DIAGNOSIS — Z888 Allergy status to other drugs, medicaments and biological substances status: Secondary | ICD-10-CM | POA: Diagnosis not present

## 2022-01-17 DIAGNOSIS — Y92019 Unspecified place in single-family (private) house as the place of occurrence of the external cause: Secondary | ICD-10-CM

## 2022-01-17 DIAGNOSIS — E785 Hyperlipidemia, unspecified: Secondary | ICD-10-CM | POA: Diagnosis present

## 2022-01-17 DIAGNOSIS — Z953 Presence of xenogenic heart valve: Secondary | ICD-10-CM

## 2022-01-17 DIAGNOSIS — D649 Anemia, unspecified: Secondary | ICD-10-CM | POA: Diagnosis not present

## 2022-01-17 DIAGNOSIS — M069 Rheumatoid arthritis, unspecified: Secondary | ICD-10-CM | POA: Diagnosis present

## 2022-01-17 DIAGNOSIS — I452 Bifascicular block: Secondary | ICD-10-CM | POA: Diagnosis present

## 2022-01-17 DIAGNOSIS — J690 Pneumonitis due to inhalation of food and vomit: Secondary | ICD-10-CM | POA: Diagnosis not present

## 2022-01-17 DIAGNOSIS — Z66 Do not resuscitate: Secondary | ICD-10-CM | POA: Diagnosis present

## 2022-01-17 DIAGNOSIS — K7200 Acute and subacute hepatic failure without coma: Secondary | ICD-10-CM | POA: Diagnosis not present

## 2022-01-17 DIAGNOSIS — I462 Cardiac arrest due to underlying cardiac condition: Secondary | ICD-10-CM | POA: Diagnosis present

## 2022-01-17 DIAGNOSIS — Z79631 Long term (current) use of antimetabolite agent: Secondary | ICD-10-CM

## 2022-01-17 DIAGNOSIS — E875 Hyperkalemia: Secondary | ICD-10-CM | POA: Diagnosis present

## 2022-01-17 DIAGNOSIS — Z880 Allergy status to penicillin: Secondary | ICD-10-CM

## 2022-01-17 DIAGNOSIS — M48061 Spinal stenosis, lumbar region without neurogenic claudication: Secondary | ICD-10-CM | POA: Diagnosis present

## 2022-01-17 DIAGNOSIS — Z923 Personal history of irradiation: Secondary | ICD-10-CM

## 2022-01-17 HISTORY — PX: TEMPORARY PACEMAKER: CATH118268

## 2022-01-17 LAB — BASIC METABOLIC PANEL
Anion gap: 10 (ref 5–15)
BUN: 22 mg/dL (ref 8–23)
CO2: 21 mmol/L — ABNORMAL LOW (ref 22–32)
Calcium: 9.2 mg/dL (ref 8.9–10.3)
Chloride: 105 mmol/L (ref 98–111)
Creatinine, Ser: 2 mg/dL — ABNORMAL HIGH (ref 0.61–1.24)
GFR, Estimated: 34 mL/min — ABNORMAL LOW (ref >60)
Glucose, Bld: 175 mg/dL — ABNORMAL HIGH (ref 70–99)
Potassium: 5.3 mmol/L — ABNORMAL HIGH (ref 3.5–5.1)
Sodium: 136 mmol/L (ref 135–145)

## 2022-01-17 LAB — TSH: TSH: 3.615 u[IU]/mL (ref 0.350–4.500)

## 2022-01-17 LAB — POCT I-STAT, CHEM 8
BUN: 26 mg/dL — ABNORMAL HIGH (ref 8–23)
Calcium, Ion: 1.2 mmol/L (ref 1.15–1.40)
Chloride: 104 mmol/L (ref 98–111)
Creatinine, Ser: 1.8 mg/dL — ABNORMAL HIGH (ref 0.61–1.24)
Glucose, Bld: 131 mg/dL — ABNORMAL HIGH (ref 70–99)
HCT: 41 % (ref 39.0–52.0)
Hemoglobin: 13.9 g/dL (ref 13.0–17.0)
Potassium: 4.3 mmol/L (ref 3.5–5.1)
Sodium: 140 mmol/L (ref 135–145)
TCO2: 25 mmol/L (ref 22–32)

## 2022-01-17 LAB — CBC
HCT: 40.3 % (ref 39.0–52.0)
HCT: 37.7 % — ABNORMAL LOW (ref 39.0–52.0)
Hemoglobin: 12.8 g/dL — ABNORMAL LOW (ref 13.0–17.0)
Hemoglobin: 12.7 g/dL — ABNORMAL LOW (ref 13.0–17.0)
MCH: 33.3 pg (ref 26.0–34.0)
MCH: 33.6 pg (ref 26.0–34.0)
MCHC: 31.8 g/dL (ref 30.0–36.0)
MCHC: 33.7 g/dL (ref 30.0–36.0)
MCV: 104.9 fL — ABNORMAL HIGH (ref 80.0–100.0)
MCV: 99.7 fL (ref 80.0–100.0)
Platelets: 237 10*3/uL (ref 150–400)
Platelets: 266 10*3/uL (ref 150–400)
RBC: 3.84 MIL/uL — ABNORMAL LOW (ref 4.22–5.81)
RBC: 3.78 MIL/uL — ABNORMAL LOW (ref 4.22–5.81)
RDW: 13 % (ref 11.5–15.5)
RDW: 13.2 % (ref 11.5–15.5)
WBC: 10.6 10*3/uL — ABNORMAL HIGH (ref 4.0–10.5)
WBC: 15.2 10*3/uL — ABNORMAL HIGH (ref 4.0–10.5)
nRBC: 0 % (ref 0.0–0.2)
nRBC: 0.1 % (ref 0.0–0.2)

## 2022-01-17 LAB — COMPREHENSIVE METABOLIC PANEL
ALT: 104 U/L — ABNORMAL HIGH (ref 0–44)
AST: 158 U/L — ABNORMAL HIGH (ref 15–41)
Albumin: 3.9 g/dL (ref 3.5–5.0)
Alkaline Phosphatase: 82 U/L (ref 38–126)
Anion gap: 7 (ref 5–15)
BUN: 24 mg/dL — ABNORMAL HIGH (ref 8–23)
CO2: 22 mmol/L (ref 22–32)
Calcium: 8.7 mg/dL — ABNORMAL LOW (ref 8.9–10.3)
Chloride: 106 mmol/L (ref 98–111)
Creatinine, Ser: 2.08 mg/dL — ABNORMAL HIGH (ref 0.61–1.24)
GFR, Estimated: 33 mL/min — ABNORMAL LOW (ref >60)
Glucose, Bld: 148 mg/dL — ABNORMAL HIGH (ref 70–99)
Potassium: 5.8 mmol/L — ABNORMAL HIGH (ref 3.5–5.1)
Sodium: 135 mmol/L (ref 135–145)
Total Bilirubin: 1.2 mg/dL (ref 0.3–1.2)
Total Protein: 6.4 g/dL — ABNORMAL LOW (ref 6.5–8.1)

## 2022-01-17 LAB — POCT I-STAT 7, (LYTES, BLD GAS, ICA,H+H)
Acid-base deficit: 2 mmol/L (ref 0.0–2.0)
Bicarbonate: 23.2 mmol/L (ref 20.0–28.0)
Calcium, Ion: 1.17 mmol/L (ref 1.15–1.40)
HCT: 38 % — ABNORMAL LOW (ref 39.0–52.0)
Hemoglobin: 12.9 g/dL — ABNORMAL LOW (ref 13.0–17.0)
O2 Saturation: 100 %
Patient temperature: 98.7
Potassium: 6.1 mmol/L — ABNORMAL HIGH (ref 3.5–5.1)
Sodium: 135 mmol/L (ref 135–145)
TCO2: 24 mmol/L (ref 22–32)
pCO2 arterial: 41.6 mmHg (ref 32–48)
pH, Arterial: 7.354 (ref 7.35–7.45)
pO2, Arterial: 327 mmHg — ABNORMAL HIGH (ref 83–108)

## 2022-01-17 LAB — MRSA NEXT GEN BY PCR, NASAL: MRSA by PCR Next Gen: NOT DETECTED

## 2022-01-17 LAB — TROPONIN I (HIGH SENSITIVITY)
Troponin I (High Sensitivity): 35 ng/L — ABNORMAL HIGH (ref <18)
Troponin I (High Sensitivity): 507 ng/L (ref <18)

## 2022-01-17 LAB — MAGNESIUM: Magnesium: 2.2 mg/dL (ref 1.7–2.4)

## 2022-01-17 LAB — T4, FREE: Free T4: 1.44 ng/dL — ABNORMAL HIGH (ref 0.61–1.12)

## 2022-01-17 SURGERY — TEMPORARY PACEMAKER
Anesthesia: LOCAL

## 2022-01-17 MED ORDER — IOHEXOL 300 MG/ML  SOLN
75.0000 mL | Freq: Once | INTRAMUSCULAR | Status: AC | PRN
  Administered 2022-01-17: 75 mL via INTRAVENOUS

## 2022-01-17 MED ORDER — OXYCODONE HCL 5 MG PO TABS: 5.0000 mg | ORAL_TABLET | ORAL | Status: DC | PRN

## 2022-01-17 MED ORDER — MIDAZOLAM HCL 2 MG/2ML IJ SOLN
2.0000 mg | Freq: Once | INTRAMUSCULAR | Status: DC
Start: 2022-01-17 — End: 2022-01-25

## 2022-01-17 MED ORDER — EPINEPHRINE 1 MG/10ML IJ SOSY
PREFILLED_SYRINGE | INTRAMUSCULAR | Status: AC | PRN
  Administered 2022-01-17: 1 mg via INTRAVENOUS

## 2022-01-17 MED ORDER — HEPARIN (PORCINE) IN NACL 1000-0.9 UT/500ML-% IV SOLN
INTRAVENOUS | Status: AC
  Filled 2022-01-17: qty 500

## 2022-01-17 MED ORDER — FENTANYL CITRATE PF 50 MCG/ML IJ SOSY
100.0000 ug | PREFILLED_SYRINGE | Freq: Once | INTRAMUSCULAR | Status: AC
  Administered 2022-01-17: 100 ug via INTRAVENOUS

## 2022-01-17 MED ORDER — FENTANYL CITRATE PF 50 MCG/ML IJ SOSY
25.0000 ug | PREFILLED_SYRINGE | INTRAMUSCULAR | Status: AC | PRN
  Administered 2022-01-17 – 2022-01-18 (×3): 25 ug via INTRAVENOUS
  Filled 2022-01-17 (×2): qty 1

## 2022-01-17 MED ORDER — DOCUSATE SODIUM 50 MG/5ML PO LIQD
100.0000 mg | Freq: Two times a day (BID) | ORAL | Status: DC
  Administered 2022-01-17 – 2022-01-18 (×3): 100 mg
  Filled 2022-01-17 (×4): qty 10

## 2022-01-17 MED ORDER — PANTOPRAZOLE 2 MG/ML SUSPENSION
40.0000 mg | Freq: Every day | ORAL | Status: DC
  Administered 2022-01-17 – 2022-01-18 (×2): 40 mg
  Filled 2022-01-17 (×3): qty 20

## 2022-01-17 MED ORDER — POLYETHYLENE GLYCOL 3350 17 G PO PACK: 17.0000 g | PACK | Freq: Every day | ORAL | Status: DC | PRN

## 2022-01-17 MED ORDER — SODIUM ZIRCONIUM CYCLOSILICATE 10 G PO PACK
10.0000 g | PACK | Freq: Once | ORAL | Status: AC
  Administered 2022-01-17: 10 g
  Filled 2022-01-17: qty 1

## 2022-01-17 MED ORDER — POLYETHYLENE GLYCOL 3350 17 G PO PACK
17.0000 g | PACK | Freq: Every day | ORAL | Status: DC
  Administered 2022-01-17 – 2022-01-18 (×2): 17 g
  Filled 2022-01-17 (×3): qty 1

## 2022-01-17 MED ORDER — FENTANYL CITRATE (PF) 100 MCG/2ML IJ SOLN: 25.0000 ug | INTRAMUSCULAR | Status: DC | PRN

## 2022-01-17 MED ORDER — LIDOCAINE HCL (PF) 1 % IJ SOLN
INTRAMUSCULAR | Status: DC | PRN
  Administered 2022-01-17: 5 mL

## 2022-01-17 MED ORDER — SODIUM CHLORIDE 0.9 % IV SOLN: INTRAVENOUS | Status: AC

## 2022-01-17 MED ORDER — SODIUM CHLORIDE 0.9 % IV BOLUS
1000.0000 mL | Freq: Once | INTRAVENOUS | Status: AC
  Administered 2022-01-17: 1000 mL via INTRAVENOUS

## 2022-01-17 MED ORDER — PROPOFOL 1000 MG/100ML IV EMUL
INTRAVENOUS | Status: AC
  Administered 2022-01-17: 30 ug/kg/min via INTRAVENOUS
  Filled 2022-01-17: qty 100

## 2022-01-17 MED ORDER — LIDOCAINE HCL (PF) 1 % IJ SOLN
INTRAMUSCULAR | Status: AC
  Filled 2022-01-17: qty 30

## 2022-01-17 MED ORDER — FENTANYL CITRATE PF 50 MCG/ML IJ SOSY
25.0000 ug | PREFILLED_SYRINGE | INTRAMUSCULAR | Status: DC | PRN
  Administered 2022-01-17 – 2022-01-22 (×4): 50 ug via INTRAVENOUS
  Filled 2022-01-17 (×5): qty 1

## 2022-01-17 MED ORDER — ONDANSETRON HCL 4 MG/2ML IJ SOLN
4.0000 mg | Freq: Four times a day (QID) | INTRAMUSCULAR | Status: DC | PRN
  Administered 2022-01-20 – 2022-01-21 (×4): 4 mg via INTRAVENOUS
  Filled 2022-01-17 (×4): qty 2

## 2022-01-17 MED ORDER — SODIUM CHLORIDE 0.9% FLUSH
3.0000 mL | Freq: Two times a day (BID) | INTRAVENOUS | Status: DC
  Administered 2022-01-17 – 2022-01-25 (×15): 3 mL via INTRAVENOUS

## 2022-01-17 MED ORDER — PROPOFOL 1000 MG/100ML IV EMUL
0.0000 ug/kg/min | INTRAVENOUS | Status: DC
  Administered 2022-01-18: 40 ug/kg/min via INTRAVENOUS
  Administered 2022-01-18: 25 ug/kg/min via INTRAVENOUS
  Administered 2022-01-18: 40 ug/kg/min via INTRAVENOUS
  Administered 2022-01-19: 25 ug/kg/min via INTRAVENOUS
  Administered 2022-01-19: 30 ug/kg/min via INTRAVENOUS
  Filled 2022-01-17 (×6): qty 100

## 2022-01-17 MED ORDER — SODIUM CHLORIDE 0.9% FLUSH
3.0000 mL | INTRAVENOUS | Status: DC | PRN
Start: 2022-01-17 — End: 2022-01-25

## 2022-01-17 MED ORDER — CHLORHEXIDINE GLUCONATE CLOTH 2 % EX PADS
6.0000 | MEDICATED_PAD | Freq: Every day | CUTANEOUS | Status: DC
  Administered 2022-01-17 – 2022-01-23 (×7): 6 via TOPICAL

## 2022-01-17 MED ORDER — FENTANYL CITRATE (PF) 100 MCG/2ML IJ SOLN: 100.0000 ug | Freq: Once | INTRAMUSCULAR | Status: DC

## 2022-01-17 MED ORDER — SODIUM CHLORIDE 0.9 % IV SOLN
250.0000 mL | INTRAVENOUS | Status: DC | PRN
Start: 2022-01-17 — End: 2022-01-25

## 2022-01-17 MED ORDER — HYDRALAZINE HCL 20 MG/ML IJ SOLN: 10.0000 mg | INTRAMUSCULAR | Status: AC | PRN

## 2022-01-17 MED ORDER — DOCUSATE SODIUM 100 MG PO CAPS: 100.0000 mg | ORAL_CAPSULE | Freq: Two times a day (BID) | ORAL | Status: DC | PRN

## 2022-01-17 MED ORDER — EPINEPHRINE HCL 5 MG/250ML IV SOLN IN NS
0.5000 ug/min | INTRAVENOUS | Status: DC
  Administered 2022-01-17: 5 ug/min via INTRAVENOUS
  Administered 2022-01-17 – 2022-01-18 (×2): 20 ug/min via INTRAVENOUS
  Administered 2022-01-18: 17 ug/min via INTRAVENOUS
  Administered 2022-01-18 – 2022-01-19 (×3): 12 ug/min via INTRAVENOUS
  Filled 2022-01-17 (×7): qty 250

## 2022-01-17 MED ORDER — ACETAMINOPHEN 325 MG PO TABS: 650.0000 mg | ORAL_TABLET | ORAL | Status: DC | PRN

## 2022-01-17 MED ORDER — HEPARIN SODIUM (PORCINE) 5000 UNIT/ML IJ SOLN
5000.0000 [IU] | Freq: Three times a day (TID) | INTRAMUSCULAR | Status: DC
  Administered 2022-01-17 – 2022-01-23 (×17): 5000 [IU] via SUBCUTANEOUS
  Filled 2022-01-17 (×17): qty 1

## 2022-01-17 MED ORDER — LABETALOL HCL 5 MG/ML IV SOLN
10.0000 mg | INTRAVENOUS | Status: AC | PRN
Start: 2022-01-17 — End: 2022-01-18

## 2022-01-17 SURGICAL SUPPLY — 12 items
CABLE ADAPT PACING TEMP 12FT (ADAPTER) ×1 IMPLANT
CATH S G BIP PACING (CATHETERS) ×1 IMPLANT
ELECT DEFIB PAD ADLT CADENCE (PAD) ×1 IMPLANT
KIT MICROPUNCTURE NIT STIFF (SHEATH) IMPLANT
PINNACLE LONG 6F 25CM (SHEATH) ×2
SHEATH FLEX ANSEL ANG 6F 45CM (SHEATH) ×1 IMPLANT
SHEATH INTRO PINNACLE 6F 25CM (SHEATH) IMPLANT
SHEATH PINNACLE 6F 10CM (SHEATH) ×1 IMPLANT
SLEEVE REPOSITIONING LENGTH 30 (MISCELLANEOUS) ×1 IMPLANT
WIRE EMERALD 3MM-J .035X150CM (WIRE) ×1 IMPLANT
WIRE HI TORQ VERSACORE-J 145CM (WIRE) ×1 IMPLANT
WIRE PACING TEMP ST TIP 5 (CATHETERS) ×1 IMPLANT

## 2022-01-17 NOTE — ED Notes (Signed)
ROSC at 1709

## 2022-01-17 NOTE — CV Procedure (Signed)
Unfortunately, critical care placed a central line in the right femoral artery.  Therefore we attempted to get up the left femoral artery for pacemaker placement and it was impossible.  Subsequently right IJ approach was used with success.  Pacemaker is set at a rate of 80 with a threshold of 1 mA and a pacing output of 4 A. No complications.

## 2022-01-17 NOTE — Progress Notes (Signed)
Charge RN emergently pulled 100 mcg of Fentanyl  due to issues with patient crossing over in epic & pyxis.  100 mcg of fentanyl charted in computer.  Pharmacy also aware.

## 2022-01-17 NOTE — Consult Note (Signed)
Cardiology Consultation:   Patient ID: Eric [name] MRN: [id number]; DOB: [birth year]  Admit date: 01/17/2022 Date of Consult: 01/17/2022  PCP:  Wanda Plump, MD   [facility] Providers Cardiologist:  None        Patient Profile:   Eric [name] is a 76 y.o. male with a hx of aortic stenosis s/p bioprosthetic AVR 2019, LBBB who is being seen 01/17/2022 for the evaluation of cardiac arrest and complete heart block at the request of Dr. Particia Nearing.  History of Present Illness:   Eric [name] is intubated and unconscious, cannot provide history. I spoke with his wife and daughter in person, and events are unclear.   The patient's wife notes that he was in his general usual state, and she went to run an errand. He called her but she did not have her phone; when she tried to return the call he did not pick up. When she got back to the house, he was confused and had fallen. They called EMS.   I do not have the EMS run sheets, but I did view the strips, which show v pacing. When not v paced, he has p waves without any ventricular conduction for approximately 5 seconds.  Per report, he received fentanyl and versed in the field and had king airway placed. There are reports of CPR and transcutaneous pacing, as well as epi administration. ET tube placed in the ER by Dr. Particia Nearing, and he was on an epinephrine drip at the time of my exam.  I spoke with patient's wife at length, as well as patient's daughter. I discussed risk/benefit of transvenous pacemaker placement in the interim, as did Dr. Katrinka Blazing. They wished to proceed, and patient was taken to the cath lab for placement of transvenous pacer.  CT head did not show acute intracranial process, and CT chest was without PE.  Past Medical History:  Diagnosis Date   Full dentures     Heart murmur     History of aortic valve stenosis 2011    06/ 2011 dx mild bicuspid stenosis;    then 04/ 2019 severe  stenosis   History of COVID-19 04/23/2019     positive result in epic;  per pt asymptomatic   History of subarachnoid hemorrhage 04/27/2019    hospital admission in epic, dx bilateral frontal SAH w/ left occipital skull fx nondisplaced due to syncope w/ fall secondary to dehydration/ ortho hypotension   History of supraventricular tachycardia 12/2017    followed by cardiology   Hyperlipidemia     Hypertension      followed by cardiology and pcp   Hypothyroidism      followed by pcp   Left bundle branch block (LBBB) 12/18/2017    post op AV replacement 12-17-2017   Lower urinary tract symptoms (LUTS)     Macular edema of right eye      Dr  Stephannie Li    OA (osteoarthritis)      multiple sites , followed by dr s. Corliss Skains   Prostate cancer [facility])      urologist--- dr Annabell Howells--- dx 04/ 2022, Gleason 3+4, PSA 3.76   Rheumatoid arthritis (HCC) 11/17/2018    rheumotologist-- dr s. 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 12/17/2017    Edwards Intuity Elite rapid deployment stented bovine pericardial tissue valve via right mini thoracotomy approach   SOB (shortness of breath)      per pt occasionally with yard work, but  ok with stairs and normal activities   Spinal stenosis of lumbar region 04/23/2018   Wide-complex tachycardia 12/30/2017    (followed by dr Dulce Sellar)  post cardioversion by EMS 12-30-2017 for SVT 170s post op AV replacement 12-17-2017 , CHF, AKI           Past Surgical History:  Procedure Laterality Date   AORTIC VALVE REPLACEMENT N/A 12/17/2017    Procedure: MINIMALLY INVASIVE AORTIC VALVE REPLACEMENT (AVR) [ using Edwards Intuity Valve Size 23mm;  Surgeon: Purcell Nails, MD;  Location: [facility] OR;  Service: Open Heart Surgery;  Laterality: N/A;  MINI THORACOTOMY   CARDIAC CATHETERIZATION   11/10/2000    @[facility]  by dr Graciela Husbands;  normal coronary angiography and normal LVF   CATARACT EXTRACTION W/ INTRAOCULAR LENS IMPLANT Right 2018   COLONOSCOPY        last one  02-29-2020 by dr h. danis   CYSTOSCOPY N/A 02/09/2021    Procedure: Bluford Kaufmann;  Surgeon: Bjorn Pippin, MD;  Location: [facility];  Service: Urology;  Laterality: N/A;   INGUINAL HERNIA REPAIR Right 2008   LAPAROSCOPIC CHOLECYSTECTOMY   06/26/2001    @[facility] ;  WITH OPEN LEFT INGUINAL HERNIA REPAIR   LUMBAR LAMINECTOMY/DECOMPRESSION MICRODISCECTOMY N/A 04/23/2018    Procedure: Microlumbar decompression Lumbar five-Sacral one;  Surgeon: Jene Every, MD;  Location: [facility] OR;  Service: Orthopedics;  Laterality: N/A;   PROSTATE BIOPSY   09/20/2020   RADIOACTIVE SEED IMPLANT N/A 02/09/2021    Procedure: RADIOACTIVE SEED IMPLANT/BRACHYTHERAPY IMPLANT;  Surgeon: Bjorn Pippin, MD;  Location: [facility];  Service: Urology;  Laterality: N/A;  56 seeds   RIGHT/LEFT HEART CATH AND CORONARY ANGIOGRAPHY N/A 10/16/2017    Procedure: RIGHT/LEFT HEART CATH AND CORONARY ANGIOGRAPHY;  Surgeon: Tonny Bollman, MD;  Location: [facility] INVASIVE CV LAB;  Service: Cardiovascular;  Laterality: N/A;   SPACE OAR INSTILLATION N/A 02/09/2021    Procedure: SPACE OAR INSTILLATION;  Surgeon: Bjorn Pippin, MD;  Location: [facility];  Service: Urology;  Laterality: N/A;   TEE WITHOUT CARDIOVERSION N/A 12/17/2017    Procedure: TRANSESOPHAGEAL ECHOCARDIOGRAM (TEE);  Surgeon: Purcell Nails, MD;  Location: [facility] OR;  Service: Open Heart Surgery;  Laterality: N/A;   TONSILLECTOMY      Current Outpatient Medications  Medication Instructions   amLODipine (NORVASC) 5 mg, Oral, Daily   aspirin EC 81 mg, Oral, Daily   benzonatate (TESSALON) 200 mg, Oral, 3 times daily PRN   carvedilol (COREG) 12.5 mg, Oral, 2 times daily with meals   Cholecalciferol (VITAMIN D-3 PO) 2,000 Units, Oral, Daily   folic acid (FOLVITE) 1 MG tablet TAKE 1 TABLET BY MOUTH EVERY DAY   levothyroxine (SYNTHROID) 88 mcg, Oral, Daily before breakfast   methotrexate (RHEUMATREX) 10 mg, Oral, Weekly   Multiple Vitamins-Iron  (MULTIVITAMINS WITH IRON) TABS tablet 1 tablet, Oral, Daily   rosuvastatin (CRESTOR) 10 mg, Oral, Daily at bedtime   spironolactone (ALDACTONE) 25 mg, Oral, Daily   Zoster Vaccine Adjuvanted (SHINGRIX) injection 0.5 mLs, Intramuscular,  Once    Inpatient Medications: Scheduled Meds:  Continuous Infusions:  [MAR Hold] epinephrine 10 mcg/min (01/17/22 1744)   PRN Meds:   Allergies:   Not on File  Social History:   Social History   Socioeconomic History   Marital status: Married    Spouse name: Not on file   Number of children: Not on file   Years of education: Not on file   Highest education level: Not on file  Occupational History  Not on file  Tobacco Use   Smoking status: Not on file   Smokeless tobacco: Not on file  Substance and Sexual Activity   Alcohol use: Not on file   Drug use: Not on file   Sexual activity: Not on file  Other Topics Concern   Not on file  Social History Narrative   Not on file   Social Determinants of Health   Financial Resource Strain: Not on file  Food Insecurity: Not on file  Transportation Needs: Not on file  Physical Activity: Not on file  Stress: Not on file  Social Connections: Not on file  Intimate Partner Violence: Not on file    Family History:   Unable to obtain  ROS:  Please see the history of present illness.  Unable to obtain All other ROS reviewed and negative.     Physical Exam/Data:   Vitals:   01/17/22 1705 01/17/22 1710  SpO2:  99%  Height: 5\' 9"  (1.753 m)    No intake or output data in the 24 hours ending 01/17/22 1824     No data to display           GEN: no purposeful response, intubated NECK: No JVD aprpecaited CARDIAC: being trancutaneously paced VASCULAR: Radial pulses 2+ bilaterally.  RESPIRATORY:  intubated ABDOMEN: Soft, non-tender, non-distended SKIN: warm, visible scratches on face NEUROLOGIC:  no purposeful response but moving arm intermittently PSYCHIATRIC:  not  responsive  Telemetry:  Telemetry was personally reviewed and demonstrates:  transvenous pacing at 70 bpm  Relevant CV Studies: Echo 04/28/2019 1. Left ventricular ejection fraction, by visual estimation, is 60 to  65%. The left ventricle has normal function. There is no left ventricular  hypertrophy.   2. Left ventricular diastolic parameters are consistent with Grade I  diastolic dysfunction (impaired relaxation).   3. Endocardium not imaged well. EF appears normal but cannot completely  exclude mild apical hypokinesis. Can consider repeat echo with Definity  contrast if clinically indicated.   4. Global right ventricle has normal systolic function.The right  ventricular size is normal. No increase in right ventricular wall  thickness.   5. Left atrial size was normal.   6. Right atrial size was normal.   7. Mild mitral annular calcification.   8. The mitral valve is normal in structure. No evidence of mitral valve  regurgitation.   9. The tricuspid valve is normal in structure. Tricuspid valve  regurgitation is trivial.  10. The pulmonic valve was grossly normal. Pulmonic valve regurgitation is  not visualized.  11. Normal pulmonary artery systolic pressure.  12. The aortic valve is tricuspid. Aortic valve regurgitation is not  visualized. Mild aortic valve sclerosis without stenosis.   Monitor 2020 An extended ZIO monitor was performed for 13 days and 17 hours beginning 02/16/2019 to evaluate near syncope.   Baseline rhythm is sinus bundle branch block is seen minimum average and maximum heart rates are 44 ,65 and 108 bpm.   There were no pauses of 3 seconds or greater and no episodes of high degree AV block or sinus node exit block.   Ventricular ectopy was rare with isolated PVCs   Supraventricular ectopy was rare with APCs there were 2 brief runs of atrial tachycardia the longest 13 complexes at 128 bpm.  There were no episodes of atrial fibrillation or flutter.    There were 5 triggered and 3 diary events.  1 of the triggered events showed infrequent PVCs all the other triggered and diary  events showed sinus rhythm and no arrhythmia.     Conclusion rare ventricular and supraventricular ectopy without bradycardia.  Triggered and symptomatic events are predominantly unassociated with arrhythmia.  2 brief runs of atrial tachycardia asymptomatic.  Laboratory Data:  High Sensitivity Troponin:  No results for input(s): "TROPONINIHS" in the last 720 hours.   Chemistry Recent Labs  Lab 01/17/22 1813  NA 140  K 4.3  CL 104  GLUCOSE 131*  BUN 26*  CREATININE 1.80*    No results for input(s): "PROT", "ALBUMIN", "AST", "ALT", "ALKPHOS", "BILITOT" in the last 168 hours. Lipids No results for input(s): "CHOL", "TRIG", "HDL", "LABVLDL", "LDLCALC", "CHOLHDL" in the last 168 hours.  Hematology Recent Labs  Lab 01/17/22 1813  HGB 13.9  HCT 41.0   Thyroid No results for input(s): "TSH", "FREET4" in the last 168 hours.  BNPNo results for input(s): "BNP", "PROBNP" in the last 168 hours.  DDimer No results for input(s): "DDIMER" in the last 168 hours.   Radiology/Studies:  CT Chest W Contrast  Result Date: 01/17/2022 CLINICAL DATA:  Trauma.  Post CPR EXAM: CT CHEST WITH CONTRAST TECHNIQUE: Multidetector CT imaging of the chest was performed during intravenous contrast administration. RADIATION DOSE REDUCTION: This exam was performed according to the departmental dose-optimization program which includes automated exposure control, adjustment of the mA and/or kV according to patient size and/or use of iterative reconstruction technique. CONTRAST:  75mL OMNIPAQUE IOHEXOL 300 MG/ML  SOLN COMPARISON:  12/31/2017 FINDINGS: Cardiovascular: No filling defects in the pulmonary arteries to suggest pulmonary emboli. Cardiomegaly. Prior aortic valve repair. Coronary artery and aortic calcifications. No aneurysm. Mediastinum/Nodes: Endotracheal tube tip in the midtrachea.  No mediastinal, hilar, or axillary adenopathy. Thyroid unremarkable. Lungs/Pleura: Dependent atelectasis in the lower lobes. Rounded ground-glass opacity in the posterior right upper lobe likely infectious/inflammatory. No effusions. Upper Abdomen: Reflux of contrast into the IVC and hepatic veins compatible with right heart dysfunction. NG tube in the stomach. Musculoskeletal: Chest wall soft tissues are unremarkable. No acute bony abnormality. IMPRESSION: No evidence of pulmonary embolus. Cardiomegaly, coronary artery disease. Dependent atelectasis in the lower lobes. Ground-glass opacity in the posterior right upper lobe is likely infectious/inflammatory. Evidence of right heart dysfunction with reflux of contrast into the IVC and hepatic veins. Aortic Atherosclerosis (ICD10-I70.0). Electronically Signed   By: Charlett Nose M.D.   On: 01/17/2022 18:21   CT Head Wo Contrast  Result Date: 01/17/2022 CLINICAL DATA:  Arrest, status post CPR, intubated EXAM: CT HEAD WITHOUT CONTRAST TECHNIQUE: Contiguous axial images were obtained from the base of the skull through the vertex without intravenous contrast. RADIATION DOSE REDUCTION: This exam was performed according to the departmental dose-optimization program which includes automated exposure control, adjustment of the mA and/or kV according to patient size and/or use of iterative reconstruction technique. COMPARISON:  06/17/2019 FINDINGS: Brain: Stable encephalomalacia inferior left frontal lobe consistent with chronic infarct or prior posttraumatic change. Stable chronic small-vessel ischemic changes throughout the periventricular white matter. No evidence of acute infarct or hemorrhage. Lateral ventricles and midline structures are unremarkable. No acute extra-axial fluid collections. Mass effect. Vascular: No hyperdense vessel or unexpected calcification. Skull: Normal. Negative for fracture or focal lesion. Sinuses/Orbits: No acute finding. Other: None.  IMPRESSION: 1. Stable head CT, no acute intracranial process. Electronically Signed   By: Sharlet Salina M.D.   On: 01/17/2022 18:19   DG Abdomen 1 View  Result Date: 01/17/2022 CLINICAL DATA:  Arrest status post CPR, intubated EXAM: ABDOMEN - 1 VIEW COMPARISON:  04/27/2019 FINDINGS:  Frontal view of the lower chest and upper abdomen demonstrates enteric catheter tip and side port projecting over the gastric fundus. Bowel gas pattern is unremarkable. Visualized portions of the lung bases are clear. Aortic valve prosthesis is noted. IMPRESSION: 1. Enteric catheter tip projecting over the gastric fundus. Electronically Signed   By: Sharlet Salina M.D.   On: 01/17/2022 17:54   DG Chest Portable 1 View  Result Date: 01/17/2022 CLINICAL DATA:  cpr status post CPR, intubation and OG tube placement EXAM: PORTABLE CHEST 1 VIEW COMPARISON:  December 28, 2020 FINDINGS: There has been interval intubation with the tip of the endotracheal tube 3.7 cm above the carina and is satisfactory. There has been interval placement of an orogastric tube with its distal end below the diaphragm. Again seen are the post aortic valve replacement changes. Cardiomegaly. Low lung volumes. Lungs are clear. No pneumo or hemothorax. The the visualized skeletal structures are unremarkable. IMPRESSION: There has been interval insertion of an endotracheal and orogastric tube and are in satisfactory position. Cardiomegaly. Low lung volume. Lungs are clear. Electronically Signed   By: Marjo Bicker M.D.   On: 01/17/2022 17:53     Assessment and Plan:   Cardiac arrest Complete heart block -transvenous pacer wire being placed currently -has P waves, appears to have normal sinus, but conduction to ventricle is only about 20 bpm -was on carvedilol at home, hold this -will need to monitor for 48 hours to see if holding beta blocker returns conduction. Otherwise, if normal mentation/function and still with complete heart blocker, will need EP  evaluation for pacemaker -he has risk factors, including prior LBBB, prior AVR, and use of beta blocker. However, event monitor 3 years ago did not note heart block or pauses -will order echo  I appreciate PCCM taking the primary care on this patient. I am concerned about his post-arrest prognosis and appreciate their assistance with management of his critical illness  CRITICAL CARE Patient is critically ill with multiple organ systems affected and requires high complexity decision making. Total critical care time: 60 minutes. This time includes gathering of history, evaluation of patient's response to treatment, examination of patient, review of laboratory and imaging studies, and coordination with consultants. Greater than 50% of time spent in direct patient care.    Risk Assessment/Risk Scores:   For questions or updates, please contact [facility] Please consult [email or web address] for contact info under    Signed, Jodelle Red, MD  01/17/2022 6:24 PM

## 2022-01-17 NOTE — Code Documentation (Signed)
Zoll pads placed on pt.

## 2022-01-17 NOTE — ED Notes (Signed)
Patient transported to CT with two RN's, respiratory, and Dr.Smith

## 2022-01-17 NOTE — ED Provider Notes (Signed)
[facility] CARDIAC CATH LAB Provider Note   CSN: [id number] Arrival date & time: 01/17/22  1701     History  Chief Complaint  Patient presents with   CPR    Eric [name] is a 76 y.o. male.  Pt is a 76 yo male with a pmhx significant for SAH, RA, spinal stenosis, LBBB, hx SVT, Aortic stenosis (s/p valve replacement), hypothyroidism, HLD, HTN, and prostate cancer.  Pt's family called EMS today because pt was feeling dizzy.  EMS found him to be awake, but in complete heart block.  They put him on TC pacers which worked for about 10 min.  Pt then became apneic and lost pulses.  They started CPR, placed a king airway and gave pt 1 atropine and 3 epis.  Pt unable to give any hx.  Per EMS, he did fall on his left side prior to arrival.  No hx blood thinners.       Home Medications Prior to Admission medications   Not on File      Allergies    Atorvastatin and Ezetimibe    Review of Systems   Review of Systems  Unable to perform ROS: Mental status change    Physical Exam Updated Vital Signs Pulse 75   Resp 16   Ht 5\' 9"  (1.753 m)   SpO2 99%  Physical Exam Vitals and nursing note reviewed.  Constitutional:      General: He is in acute distress.  HENT:     Head:     Comments: Bruising to left side of face King airway in place    Right Ear: External ear normal.     Left Ear: External ear normal.     Nose: Nose normal.     Mouth/Throat:     Mouth: Mucous membranes are dry.     Comments: Upper dentures in place (removed for intubation) Eyes:     Pupils: Pupils are equal, round, and reactive to light.  Neck:     Trachea: Trachea normal.  Cardiovascular:     Comments: No HR initially Pulmonary:     Comments: BS bilaterally with bagging Chest:     Comments: Contusion left upper chest Abdominal:     General: Abdomen is flat.  Skin:    General: Skin is warm.     Capillary Refill: Capillary refill takes more than 3 seconds.  Neurological:      Mental Status: He is unresponsive.  Psychiatric:     Comments: Unable to assess     ED Results / Procedures / Treatments   Labs (all labs ordered are listed, but only abnormal results are displayed) Labs Reviewed  CBC - Abnormal; Notable for the following components:      Result Value   WBC 10.6 (*)    RBC 3.84 (*)    Hemoglobin 12.8 (*)    MCV 104.9 (*)    All other components within normal limits  POCT I-STAT, CHEM 8 - Abnormal; Notable for the following components:   BUN 26 (*)    Creatinine, Ser 1.80 (*)    Glucose, Bld 131 (*)    All other components within normal limits  BASIC METABOLIC PANEL  BLOOD GAS, ARTERIAL  I-STAT CHEM 8, ED  TROPONIN I (HIGH SENSITIVITY)    EKG None  Radiology CT Chest W Contrast  Result Date: 01/17/2022 CLINICAL DATA:  Trauma.  Post CPR EXAM: CT CHEST WITH CONTRAST TECHNIQUE: Multidetector CT imaging of the chest was performed  during intravenous contrast administration. RADIATION DOSE REDUCTION: This exam was performed according to the departmental dose-optimization program which includes automated exposure control, adjustment of the mA and/or kV according to patient size and/or use of iterative reconstruction technique. CONTRAST:  60mL OMNIPAQUE IOHEXOL 300 MG/ML  SOLN COMPARISON:  12/31/2017 FINDINGS: Cardiovascular: No filling defects in the pulmonary arteries to suggest pulmonary emboli. Cardiomegaly. Prior aortic valve repair. Coronary artery and aortic calcifications. No aneurysm. Mediastinum/Nodes: Endotracheal tube tip in the midtrachea. No mediastinal, hilar, or axillary adenopathy. Thyroid unremarkable. Lungs/Pleura: Dependent atelectasis in the lower lobes. Rounded ground-glass opacity in the posterior right upper lobe likely infectious/inflammatory. No effusions. Upper Abdomen: Reflux of contrast into the IVC and hepatic veins compatible with right heart dysfunction. NG tube in the stomach. Musculoskeletal: Chest wall soft tissues are  unremarkable. No acute bony abnormality. IMPRESSION: No evidence of pulmonary embolus. Cardiomegaly, coronary artery disease. Dependent atelectasis in the lower lobes. Ground-glass opacity in the posterior right upper lobe is likely infectious/inflammatory. Evidence of right heart dysfunction with reflux of contrast into the IVC and hepatic veins. Aortic Atherosclerosis (ICD10-I70.0). Electronically Signed   By: Charlett Nose M.D.   On: 01/17/2022 18:21   CT Head Wo Contrast  Result Date: 01/17/2022 CLINICAL DATA:  Arrest, status post CPR, intubated EXAM: CT HEAD WITHOUT CONTRAST TECHNIQUE: Contiguous axial images were obtained from the base of the skull through the vertex without intravenous contrast. RADIATION DOSE REDUCTION: This exam was performed according to the departmental dose-optimization program which includes automated exposure control, adjustment of the mA and/or kV according to patient size and/or use of iterative reconstruction technique. COMPARISON:  06/17/2019 FINDINGS: Brain: Stable encephalomalacia inferior left frontal lobe consistent with chronic infarct or prior posttraumatic change. Stable chronic small-vessel ischemic changes throughout the periventricular white matter. No evidence of acute infarct or hemorrhage. Lateral ventricles and midline structures are unremarkable. No acute extra-axial fluid collections. Mass effect. Vascular: No hyperdense vessel or unexpected calcification. Skull: Normal. Negative for fracture or focal lesion. Sinuses/Orbits: No acute finding. Other: None. IMPRESSION: 1. Stable head CT, no acute intracranial process. Electronically Signed   By: Sharlet Salina M.D.   On: 01/17/2022 18:19   DG Abdomen 1 View  Result Date: 01/17/2022 CLINICAL DATA:  Arrest status post CPR, intubated EXAM: ABDOMEN - 1 VIEW COMPARISON:  04/27/2019 FINDINGS: Frontal view of the lower chest and upper abdomen demonstrates enteric catheter tip and side port projecting over the gastric  fundus. Bowel gas pattern is unremarkable. Visualized portions of the lung bases are clear. Aortic valve prosthesis is noted. IMPRESSION: 1. Enteric catheter tip projecting over the gastric fundus. Electronically Signed   By: Sharlet Salina M.D.   On: 01/17/2022 17:54   DG Chest Portable 1 View  Result Date: 01/17/2022 CLINICAL DATA:  cpr status post CPR, intubation and OG tube placement EXAM: PORTABLE CHEST 1 VIEW COMPARISON:  December 28, 2020 FINDINGS: There has been interval intubation with the tip of the endotracheal tube 3.7 cm above the carina and is satisfactory. There has been interval placement of an orogastric tube with its distal end below the diaphragm. Again seen are the post aortic valve replacement changes. Cardiomegaly. Low lung volumes. Lungs are clear. No pneumo or hemothorax. The the visualized skeletal structures are unremarkable. IMPRESSION: There has been interval insertion of an endotracheal and orogastric tube and are in satisfactory position. Cardiomegaly. Low lung volume. Lungs are clear. Electronically Signed   By: Marjo Bicker M.D.   On: 01/17/2022 17:53  Procedures Procedure Name: Intubation Date/Time: 01/17/2022 6:44 PM  Performed by: Jacalyn Lefevre, MDOxygen Delivery Method: Ambu bag Preoxygenation: Pre-oxygenation with 100% oxygen Laryngoscope Size: Glidescope and 3 Tube size: 7.5 mm Number of attempts: 1 Tube secured with: ETT holder Dental Injury: Teeth and Oropharynx as per pre-operative assessment     .Central Line  Date/Time: 01/17/2022 6:44 PM  Performed by: Jacalyn Lefevre, MD Authorized by: Jacalyn Lefevre, MD   Consent:    Consent obtained:  Emergent situation Pre-procedure details:    Indication(s): central venous access, hemodynamic monitoring and insufficient peripheral access     Hand hygiene: Hand hygiene performed prior to insertion     Sterile barrier technique: All elements of maximal sterile technique followed     Skin preparation:   Chlorhexidine   Skin preparation agent: Skin preparation agent completely dried prior to procedure   Procedure details:    Location:  R femoral   Patient position:  Supine   Procedural supplies:  Triple lumen   Catheter size:  7 Fr   Landmarks identified: yes     Ultrasound guidance: no     Number of attempts:  1   Successful placement: yes   Post-procedure details:    Post-procedure:  Dressing applied and line sutured   Assessment:  Blood return through all ports and free fluid flow   Procedure completion:  Tolerated well, no immediate complications External pacer  Date/Time: 01/17/2022 6:45 PM  Performed by: Jacalyn Lefevre, MD Authorized by: Jacalyn Lefevre, MD  Consent: The procedure was performed in an emergent situation. Local anesthesia used: no  Anesthesia: Local anesthesia used: no  Sedation: Patient sedated: no        Medications Ordered in ED Medications  EPINEPHrine (ADRENALIN) 5 mg in NS 250 mL (0.02 mg/mL) premix infusion ( Intravenous MAR Hold 01/17/22 1812)  lidocaine (PF) (XYLOCAINE) 1 % injection (5 mLs  Given 01/17/22 1826)  EPINEPHrine (ADRENALIN) 1 MG/10ML injection (1 mg Intravenous Given 01/17/22 1700)  sodium chloride 0.9 % bolus 1,000 mL (1,000 mLs Intravenous New Bag/Given 01/17/22 1749)  iohexol (OMNIPAQUE) 300 MG/ML solution 75 mL (75 mLs Intravenous Contrast Given 01/17/22 1814)    ED Course/ Medical Decision Making/ A&P                           Medical Decision Making Amount and/or Complexity of Data Reviewed Labs: ordered. Radiology: ordered.  Risk Prescription drug management.   This patient presents to the ED for concern of cardiac arrest, this involves an extensive number of treatment options, and is a complaint that carries with it a high risk of complications and morbidity.  The differential diagnosis includes complete heart block   Co morbidities that complicate the patient evaluation  SAH, RA, spinal stenosis, LBBB, hx SVT,  Aortic stenosis (s/p valve replacement), hypothyroidism, HLD, HTN, and prostate cancer   Additional history obtained:  Additional history obtained from epic chart review External records from outside source obtained and reviewed including EMS report   Lab Tests:  I Ordered, and personally interpreted labs.  The pertinent results include:  cbc with wbc 10.6, hgb 12.8; additional labs pending when pt left the department.   Imaging Studies ordered:  I ordered imaging studies including cxr, ct head, ct chest  I independently visualized and interpreted imaging which showed  CXR: IMPRESSION:  There has been interval insertion of an endotracheal and orogastric  tube and are in satisfactory position. Cardiomegaly. Low lung  volume. Lungs are clear.  CT head: IMPRESSION:  1. Stable head CT, no acute intracranial process.    CT chest: IMPRESSION:  No evidence of pulmonary embolus.    Cardiomegaly, coronary artery disease.    Dependent atelectasis in the lower lobes. Ground-glass opacity in  the posterior right upper lobe is likely infectious/inflammatory.    Evidence of right heart dysfunction with reflux of contrast into the  IVC and hepatic veins.   I agree with the radiologist interpretation   Cardiac Monitoring:  The patient was maintained on a cardiac monitor.  I personally viewed and interpreted the cardiac monitored which showed an underlying rhythm of: complete heart block   Medicines ordered and prescription drug management:  I ordered medication including epi drip  for hypotension  Reevaluation of the patient after these medicines showed that the patient improved I have reviewed the patients home medicines and have made adjustments as needed   Test Considered:  ct   Critical Interventions:  Tc pacing   Consultations Obtained:  I requested consultation with the cardiologist (Dr. Cristal Deer),  and discussed lab and imaging findings as well as pertinent  plan -she recommends taking pt to the cath lab for a wire   Problem List / ED Course:  Cardiac arrest:  When pt arrived, CPR getting done by the [facility].  Pt was given 1 mg epi.  The king airway was changed out for an ETT. When it was time for a pulse check, pt did have a pulse.  The monitor showed a complete heart block.  We started TC pacing.  Pt d/w cards who came to see pt.  Pt was hypotensive, so he was started on an epi drip.  Access poor, so a central line was started.  Pt had a ct of his head and chest to r/o trauma because he fell.  Pt taken emergently to the cath lab.     Reevaluation:  After the interventions noted above, I reevaluated the patient and found that they have :improved   Social Determinants of Health:  Lives at home   Dispostion:  After consideration of the diagnostic results and the patients response to treatment, I feel that the patent would benefit from admission.    CRITICAL CARE Performed by: Jacalyn Lefevre   Total critical care time: 60 minutes  Critical care time was exclusive of separately billable procedures and treating other patients.  Critical care was necessary to treat or prevent imminent or life-threatening deterioration.  Critical care was time spent personally by me on the following activities: development of treatment plan with patient and/or surrogate as well as nursing, discussions with consultants, evaluation of patient's response to treatment, examination of patient, obtaining history from patient or surrogate, ordering and performing treatments and interventions, ordering and review of laboratory studies, ordering and review of radiographic studies, pulse oximetry and re-evaluation of patient's condition.         Final Clinical Impression(s) / ED Diagnoses Final diagnoses:  Heart block AV complete [facility])  Cardiac arrest [facility])    Rx / DC Orders ED Discharge Orders     None         Jacalyn Lefevre, MD 01/17/22 [phone]

## 2022-01-17 NOTE — ED Triage Notes (Signed)
Pt BIB [facility] as CPR in progress. 3 rounds of EPI by EMS before arrival, see code documentation for full EMS report. ROSC obtained at 1709.

## 2022-01-17 NOTE — Progress Notes (Signed)
eLink Physician-Brief Progress Note Patient Name: Eric [name] DOB: July 17, 1945 MRN: [id number]   Date of Service  01/17/2022  HPI/Events of Note  Multiple issues: 1. Hyperkalemia - K+ = 5.8 and Creatinine = 2.08. 2. Troponin  = 507 in setting of post cardiac arrest, Complete Heart Block and post transvenous pacer placement. Cardiology following the patient already.   eICU Interventions  Plan" Lokelma 10 gm per tube now. Repeat BMP ordered for 5 AM. Continue to trend Troponin.     Intervention Category Major Interventions: Electrolyte abnormality - evaluation and management;Other:  Jahsiah Carpenter Dennard Nip 01/17/2022, 11:02 PM

## 2022-01-17 NOTE — Plan of Care (Signed)
Patient BIBA, LMA in place. Intubated by ER physician without issue. RT bedside to assist. Placed on ventilator as charted. Patient transported to CT scan.

## 2022-01-17 NOTE — ED Notes (Signed)
Dr. Katrinka Blazing increased mA to 56 with this RN while pacing pt in CT before transporting to cath lab.

## 2022-01-17 NOTE — H&P (Addendum)
NAME:  Eric [name], MRN:  [id number], DOB:  18-Apr-1946, LOS: 0 ADMISSION DATE:  01/17/2022, CONSULTATION DATE:  8/10 REFERRING MD:  Dr. Cristal Deer, CHIEF COMPLAINT:  Cardiac arrest   History of Present Illness:  Patient is encephalopathic and/or intubated. Therefore history has been obtained from chart review.   76 year old male with PMH as below, which is significant for aortic stenosis s/p bioprosthetic replacement in 2019, traumatic SAH, HTN, HLD, hypothyroid, RA on methotrexate, and prostate Ca s/p radioactive seeds. He was in his usual state of health on 8/10. Wife left the house to run errands. He did attempt to call her at some point, when she tired to call back he did not answer. She arrived home to find him altered after a fall. EMS was called and upon their arrival he was awake and found to be in complete heart block. He was externally paced for about 10 mins en route, but subsequently lose pulses. CPR initiated and [facility] airway placed. ACLS ongoing upon arrival to ED. ROSC achieved 1st pulse check in ED. 3rd degree block. Cardiology consulted. Epi drip started for shock. Head CT negative post fall. Patient taken to the cath lab for further evaluation. Transvenous pacemaker placed in the cath lab.   Pertinent  Medical History  ortic stenosis s/p bioprosthetic replacement in 2019, traumatic SAH, HTN, HLD, hypothyroid, RA on methotrexate, and prostate Ca s/p radioactive seeds.  Significant Hospital Events: Including procedures, antibiotic start and stop dates in addition to other pertinent events   8/10 cardiac arrest, CHB, transvenous pacer placed.   Interim History / Subjective:    Objective   Pulse 75, resp. rate 16, height 5\' 9"  (1.753 m), SpO2 99 %.    Vent Mode: PRVC FiO2 (%):  [100 %] 100 % Set Rate:  [16 bmp] 16 bmp Vt Set:  [570 mL] 570 mL PEEP:  [8 cmH20] 8 cmH20  No intake or output data in the 24 hours ending 01/17/22 1919 There were no vitals filed for this  visit.  Examination: General: elderly appearing male in NAD HENT: abrasion to left side of face. PERRL, no JVD. Tongue midline Lungs: Clear bilateral breath sounds Cardiovascular: Paced  at 80 Abdomen: Soft, non-tender, non-focal Extremities: No acute deformity or ROM limitation Neuro: Awake, agitated, can redirect and will follow commands GU: Foley.   CT head: nonacute CT chest: No evidence of PE (non-contrasted study), cardiomegaly, CAD, bilateral ATX, GGO RUL, Evidence of right heart dysfunction with reflux of contrast into the IVC and hepatic veins.  Resolved Hospital Problem list     Assessment & Plan:   Cardiac arrest: PEA/asystole witnessed in field. Uncertain duration of ACLS. CHB on ROSC. Awake and following commands post cath lab.  Complete heart block: s/p transvenous pacemaker 8/10. ? Due to beta blocker Cardiogenic shock -  Full vent support, will attempt to evaluate for extubation once vent sufficiently weaned.  - Appreciate cardiology - Avoid fever - epinephrine infusion for MAP 65. Weaning. (MAP 89 currently) - R fem CVL placed in ED emergently. Will need to be removed.  - Will have pacemaker for 48 hours likely, can use sheath for central access assuming he continues to improve. Can place CVL alternatively.  - Holding BB - Will need EP eval - Echo pending - Propofol, PRN fentanyl for RASS goal -1 to -2.   AKI: hopefully will improve now that perfusion is better Hyperkalemia - gentle hydration - trend BMP  HTN HLD - holding home amlodipine, coreg,  spironolactone, statin for now.   RA - holding methotrexate  Hypothyroidism - check TSH   Best Practice (right click and "Reselect all SmartList Selections" daily)   Diet/type: NPO DVT prophylaxis: prophylactic heparin  GI prophylaxis: PPI Lines: Central line Foley:  N/A Code Status:  full code Last date of multidisciplinary goals of care discussion [  8/10 : patient does have living will with DNR.  Seeing as he has already been resuscitated today and potentially has a favorable prognosis family would like to keep full code. Should he not progress or worsen it would be prudent to re-evaluate]  Labs   CBC: Recent Labs  Lab 01/17/22 1755 01/17/22 1813  WBC 10.6*  --   HGB 12.8* 13.9  HCT 40.3 41.0  MCV 104.9*  --   PLT 237  --     Basic Metabolic Panel: Recent Labs  Lab 01/17/22 1813  NA 140  K 4.3  CL 104  GLUCOSE 131*  BUN 26*  CREATININE 1.80*   GFR: CrCl cannot be calculated (Unknown ideal weight.). Recent Labs  Lab 01/17/22 1755  WBC 10.6*    Liver Function Tests: No results for input(s): "AST", "ALT", "ALKPHOS", "BILITOT", "PROT", "ALBUMIN" in the last 168 hours. No results for input(s): "LIPASE", "AMYLASE" in the last 168 hours. No results for input(s): "AMMONIA" in the last 168 hours.  ABG    Component Value Date/Time   TCO2 25 01/17/2022 1813     Coagulation Profile: No results for input(s): "INR", "PROTIME" in the last 168 hours.  Cardiac Enzymes: No results for input(s): "CKTOTAL", "CKMB", "CKMBINDEX", "TROPONINI" in the last 168 hours.  HbA1C: No results found for: "HGBA1C"  CBG: No results for input(s): "GLUCAP" in the last 168 hours.  Review of Systems:   Patient is encephalopathic and/or intubated. Therefore history has been obtained from chart review.    Past Medical History:  He,  has no past medical history on file.   Surgical History:  AVR 2019   Social History:      Family History:  His family history is not on file.   Allergies Allergies  Allergen Reactions   Atorvastatin Other (See Comments)    myalgia   Ezetimibe Other (See Comments)    myalgia     Home Medications  Prior to Admission medications   Not on File     Critical care time: 48 minutes necessary due to complete heart block, cardiogenic shock      Joneen Roach, AGACNP-BC [facility]  See Amion for personal pager PCCM  on call pager [phone] until 7pm. Please call Elink 7p-7a. [phone]  01/17/2022 8:11 PM

## 2022-01-17 NOTE — Code Documentation (Addendum)
Pt arrived via EMS after family called 911 after pt fell in house, and crawled to bedroom. Pt c/o per EMS "dizziness, L sided weakness, facial tingling, and left sided weak grip strength." "History of pacing in the past with EMS for third degree heart block." EMS "gave 5mg  of versed in the field and 100 mcg of Fentanyl in the field for insertion of King airway, 3 doses of Epi given prehospital" For EMS pt was alert and oriented for first assessment, when placed on zoll pt began to brady down and went into a witnessed arrest."

## 2022-01-18 ENCOUNTER — Encounter (HOSPITAL_COMMUNITY): Payer: Self-pay | Admitting: Interventional Cardiology

## 2022-01-18 ENCOUNTER — Inpatient Hospital Stay (HOSPITAL_COMMUNITY): Payer: Medicare Other

## 2022-01-18 DIAGNOSIS — I442 Atrioventricular block, complete: Secondary | ICD-10-CM

## 2022-01-18 DIAGNOSIS — I469 Cardiac arrest, cause unspecified: Secondary | ICD-10-CM | POA: Diagnosis not present

## 2022-01-18 DIAGNOSIS — R570 Cardiogenic shock: Secondary | ICD-10-CM | POA: Diagnosis not present

## 2022-01-18 LAB — BASIC METABOLIC PANEL
Anion gap: 11 (ref 5–15)
BUN: 30 mg/dL — ABNORMAL HIGH (ref 8–23)
CO2: 18 mmol/L — ABNORMAL LOW (ref 22–32)
Calcium: 8.3 mg/dL — ABNORMAL LOW (ref 8.9–10.3)
Chloride: 107 mmol/L (ref 98–111)
Creatinine, Ser: 2.4 mg/dL — ABNORMAL HIGH (ref 0.61–1.24)
GFR, Estimated: 27 mL/min — ABNORMAL LOW (ref >60)
Glucose, Bld: 201 mg/dL — ABNORMAL HIGH (ref 70–99)
Potassium: 5.3 mmol/L — ABNORMAL HIGH (ref 3.5–5.1)
Sodium: 136 mmol/L (ref 135–145)

## 2022-01-18 LAB — COMPREHENSIVE METABOLIC PANEL
ALT: 74 U/L — ABNORMAL HIGH (ref 0–44)
AST: 70 U/L — ABNORMAL HIGH (ref 15–41)
Albumin: 3.3 g/dL — ABNORMAL LOW (ref 3.5–5.0)
Alkaline Phosphatase: 65 U/L (ref 38–126)
Anion gap: 11 (ref 5–15)
BUN: 30 mg/dL — ABNORMAL HIGH (ref 8–23)
CO2: 19 mmol/L — ABNORMAL LOW (ref 22–32)
Calcium: 8.7 mg/dL — ABNORMAL LOW (ref 8.9–10.3)
Chloride: 105 mmol/L (ref 98–111)
Creatinine, Ser: 1.95 mg/dL — ABNORMAL HIGH (ref 0.61–1.24)
GFR, Estimated: 35 mL/min — ABNORMAL LOW (ref >60)
Glucose, Bld: 174 mg/dL — ABNORMAL HIGH (ref 70–99)
Potassium: 4.7 mmol/L (ref 3.5–5.1)
Sodium: 135 mmol/L (ref 135–145)
Total Bilirubin: 0.9 mg/dL (ref 0.3–1.2)
Total Protein: 5.9 g/dL — ABNORMAL LOW (ref 6.5–8.1)

## 2022-01-18 LAB — LACTIC ACID, PLASMA
Lactic Acid, Venous: 3.6 mmol/L (ref 0.5–1.9)
Lactic Acid, Venous: 3.4 mmol/L (ref 0.5–1.9)

## 2022-01-18 LAB — CBC
HCT: 35.4 % — ABNORMAL LOW (ref 39.0–52.0)
HCT: 38.3 % — ABNORMAL LOW (ref 39.0–52.0)
Hemoglobin: 11.9 g/dL — ABNORMAL LOW (ref 13.0–17.0)
Hemoglobin: 12.9 g/dL — ABNORMAL LOW (ref 13.0–17.0)
MCH: 33.7 pg (ref 26.0–34.0)
MCH: 33.2 pg (ref 26.0–34.0)
MCHC: 33.6 g/dL (ref 30.0–36.0)
MCHC: 33.7 g/dL (ref 30.0–36.0)
MCV: 100.3 fL — ABNORMAL HIGH (ref 80.0–100.0)
MCV: 98.5 fL (ref 80.0–100.0)
Platelets: 233 10*3/uL (ref 150–400)
Platelets: 205 10*3/uL (ref 150–400)
RBC: 3.53 MIL/uL — ABNORMAL LOW (ref 4.22–5.81)
RBC: 3.89 MIL/uL — ABNORMAL LOW (ref 4.22–5.81)
RDW: 13.2 % (ref 11.5–15.5)
RDW: 13.3 % (ref 11.5–15.5)
WBC: 15.3 10*3/uL — ABNORMAL HIGH (ref 4.0–10.5)
WBC: 15.8 10*3/uL — ABNORMAL HIGH (ref 4.0–10.5)
nRBC: 0 % (ref 0.0–0.2)
nRBC: 0 % (ref 0.0–0.2)

## 2022-01-18 LAB — PHOSPHORUS: Phosphorus: 4 mg/dL (ref 2.5–4.6)

## 2022-01-18 LAB — GLUCOSE, CAPILLARY
Glucose-Capillary: 145 mg/dL — ABNORMAL HIGH (ref 70–99)
Glucose-Capillary: 150 mg/dL — ABNORMAL HIGH (ref 70–99)
Glucose-Capillary: 172 mg/dL — ABNORMAL HIGH (ref 70–99)
Glucose-Capillary: 185 mg/dL — ABNORMAL HIGH (ref 70–99)

## 2022-01-18 LAB — ECHOCARDIOGRAM COMPLETE
AR max vel: 3.26 cm2
AV Area VTI: 2.33 cm2
AV Area mean vel: 2.81 cm2
AV Mean grad: 2 mmHg
AV Peak grad: 3.3 mmHg
Ao pk vel: 0.91 m/s
Area-P 1/2: 3.05 cm2
Height: 69 in
S' Lateral: 2.5 cm
Weight: 3118.19 oz

## 2022-01-18 LAB — TROPONIN I (HIGH SENSITIVITY): Troponin I (High Sensitivity): 479 ng/L (ref <18)

## 2022-01-18 LAB — HEMOGLOBIN A1C
Hgb A1c MFr Bld: 5.5 % (ref 4.8–5.6)
Mean Plasma Glucose: 111.15 mg/dL

## 2022-01-18 LAB — MAGNESIUM: Magnesium: 2.1 mg/dL (ref 1.7–2.4)

## 2022-01-18 LAB — SARS CORONAVIRUS 2 BY RT PCR: SARS Coronavirus 2 by RT PCR: NEGATIVE

## 2022-01-18 LAB — TRIGLYCERIDES: Triglycerides: 180 mg/dL — ABNORMAL HIGH (ref <150)

## 2022-01-18 MED ORDER — PIPERACILLIN-TAZOBACTAM 3.375 G IVPB
3.3750 g | Freq: Three times a day (TID) | INTRAVENOUS | Status: DC
  Administered 2022-01-18 – 2022-01-20 (×6): 3.375 g via INTRAVENOUS
  Filled 2022-01-18 (×6): qty 50

## 2022-01-18 MED ORDER — ACETAMINOPHEN 325 MG PO TABS
650.0000 mg | ORAL_TABLET | ORAL | Status: DC | PRN
  Administered 2022-01-18: 650 mg via ORAL
  Filled 2022-01-18: qty 2

## 2022-01-18 MED ORDER — LACTATED RINGERS IV BOLUS
1000.0000 mL | Freq: Once | INTRAVENOUS | Status: AC
  Administered 2022-01-18: 1000 mL via INTRAVENOUS

## 2022-01-18 MED ORDER — ORAL CARE MOUTH RINSE: 15.0000 mL | OROMUCOSAL | Status: DC | PRN

## 2022-01-18 MED ORDER — ORAL CARE MOUTH RINSE
15.0000 mL | OROMUCOSAL | Status: DC
  Administered 2022-01-19 (×5): 15 mL via OROMUCOSAL

## 2022-01-18 MED ORDER — LEVOTHYROXINE SODIUM 88 MCG PO TABS
88.0000 ug | ORAL_TABLET | Freq: Every day | ORAL | Status: DC
Start: 2022-01-19 — End: 2022-01-19
  Administered 2022-01-19: 88 ug via ORAL
  Filled 2022-01-18: qty 1

## 2022-01-18 MED ORDER — INSULIN ASPART 100 UNIT/ML IJ SOLN
0.0000 [IU] | INTRAMUSCULAR | Status: DC
  Administered 2022-01-18: 2 [IU] via SUBCUTANEOUS
  Administered 2022-01-18 – 2022-01-19 (×3): 3 [IU] via SUBCUTANEOUS
  Administered 2022-01-19: 2 [IU] via SUBCUTANEOUS
  Administered 2022-01-19: 3 [IU] via SUBCUTANEOUS
  Administered 2022-01-19 – 2022-01-25 (×7): 2 [IU] via SUBCUTANEOUS

## 2022-01-18 NOTE — Progress Notes (Signed)
Pharmacy Antibiotic Note  Eric [name] is a 76 y.o. male admitted on 01/17/2022 with  aspiration Pneumonia .  Patient presented with cardiac arrest, receiving ROSC after  10 minutes of CPR. Imaging 8/11 is concerning for pneumonia. Pharmacy has been consulted for Zosyn dosing. Pt is currently ventilated and requiring epinephrine.  WBC is 15.3 and pt is febrile at 101.30F.   Plan: Start Zosyn 3.375g every 8 hours F/u cultures and clinical progress  Height: 5\' 9"  (175.3 cm) Weight: 88.4 kg (194 lb 14.2 oz) IBW/kg (Calculated) : 70.7  Temp (24hrs), Avg:99.9 F (37.7 C), Min:99.1 F (37.3 C), Max:101.1 F (38.4 C)  Recent Labs  Lab 01/17/22 1755 01/17/22 1813 01/17/22 2057 01/18/22 0422  WBC 10.6*  --  15.2* 15.3*  CREATININE 2.00* 1.80* 2.08* 2.40*    Estimated Creatinine Clearance: 28.8 mL/min (A) (by C-G formula based on SCr of 2.4 mg/dL (H)).    Allergies  Allergen Reactions   Lipitor [Atorvastatin] Other (See Comments)    Myalgia    Zetia [Ezetimibe] Other (See Comments)    Myalgia     Antimicrobials this admission: Zosyn 8/11 >>   Microbiology results: 8/11 BCx: IP  Thank you for allowing pharmacy to participate in this patient's care.  10/11, PharmD PGY2 Pharmacy Resident 01/18/2022 1:39 PM Check [email or web address] for unit specific pharmacy number

## 2022-01-18 NOTE — Progress Notes (Addendum)
NAME:  Eric [name], MRN:  [id number], DOB:  1945-12-03, LOS: 1 ADMISSION DATE:  01/17/2022, CONSULTATION DATE:  8/10 REFERRING MD:  Dr. Cristal Deer, CHIEF COMPLAINT:  Cardiac arrest   History of Present Illness:  Patient is encephalopathic and/or intubated. Therefore history has been obtained from chart review.   76 year old male with PMH as below, which is significant for aortic stenosis s/p bioprosthetic replacement in 2019, traumatic SAH, HTN, HLD, hypothyroid, RA on methotrexate, and prostate Ca s/p radioactive seeds. He was in his usual state of health on 8/10. Wife left the house to run errands. He did attempt to call her at some point, when she tired to call back he did not answer. She arrived home to find him altered after a fall. EMS was called and upon their arrival he was awake and found to be in complete heart block. He was externally paced for about 10 mins en route, but subsequently lose pulses. CPR initiated and [facility] airway placed. ACLS ongoing upon arrival to ED. ROSC achieved 1st pulse check in ED. 3rd degree block. Cardiology consulted. Epi drip started for shock. Head CT negative post fall. Patient taken to the cath lab for further evaluation. Transvenous pacemaker placed in the cath lab.   Pertinent  Medical History  ortic stenosis s/p bioprosthetic replacement in 2019, traumatic SAH, HTN, HLD, hypothyroid, RA on methotrexate, and prostate Ca s/p radioactive seeds.  Significant Hospital Events: Including procedures, antibiotic start and stop dates in addition to other pertinent events   8/10 cardiac arrest, CHB, transvenous pacer placed. Approx CPR 8/11 Remains intubated, spiked fever,   Interim History / Subjective:   Remains on epi gtt Weaning sedation 325cc UOP Spiked fever to 101F   Objective   Blood pressure 111/64, pulse 80, temperature 99.8 F (37.7 C), temperature source Axillary, resp. rate 16, height 5\' 9"  (1.753 m), weight 88.4 kg, SpO2 97 %.     Vent Mode: PRVC FiO2 (%):  [40 %-100 %] 40 % Set Rate:  [16 bmp] 16 bmp Vt Set:  [570 mL] 570 mL PEEP:  [5 cmH20-8 cmH20] 5 cmH20 Plateau Pressure:  [21 cmH20-28 cmH20] 21 cmH20   Intake/Output Summary (Last 24 hours) at 01/18/2022 0957 Last data filed at 01/18/2022 3295 Gross per 24 hour  Intake 916.91 ml  Output 300 ml  Net 616.91 ml   Filed Weights   01/17/22 2000 01/18/22 0500  Weight: 88 kg 88.4 kg      General:  elderly M, intubated and sedated HEENT: MM pink/moist, superficial abrasions to face  Neuro: examined on low dose propofol, starting to arouse to voice to and move extremities, intermittent bilat UE tremor CV: s1s2 paced, no m/r/g PULM:  no rhonchi or wheezing, on full vent support GI: soft, non-tender  Extremities: warm/dry, no edema  Skin: no rashes or lesions GU foley   Labs: Trop 479 K 5.3 Creatinine 2.4 WBC 15k    CT head: nonacute CT chest: No evidence of PE (non-contrasted study), cardiomegaly, CAD, bilateral ATX, GGO RUL, Evidence of right heart dysfunction with reflux of contrast into the IVC and hepatic veins.  Resolved Hospital Problem list     Assessment & Plan:   Cardiac arrest Complete heart block Cardiogenic shock Acute Hypoxic Respiratory Failure Family at the bedside state that he has been more short of breath recently, but does not admit when is not feeling well s/p transvenous pacemaker 8/10. Possibly due to beta blocker - Appreciate cardiology, planning on 48hrs bb  washout to assess underlying rhythm then possible EP consult - epinephrine infusion for MAP 65.  -if tremoring persists or worsens then EEG - Will have pacemaker for 48 hours likely, can use sheath for central access assuming he continues to improve. Can place CVL alternatively if needed, had emergent one CVC placed in the ED - Echo pending - Propofol, PRN fentanyl for RASS goal -1 to -2.  -Maintain full vent support with SAT/SBT as tolerated -titrate Vent  setting to maintain SpO2 greater than or equal to 90%. -HOB elevated 30 degrees. -Plateau pressures less than 30 cm H20.  -Follow chest x-ray, ABG prn.   -Bronchial hygiene and RT/bronchodilator protocol.   AKI Hyperkalemia Creatinine still rising to 2.4, K 5.3  hopefully will improve now that perfusion is better -at risk for renal failure, repeat BMP this afternoon  -avoid nephrotoxins, monitor UOP   Fever New 8/11, imaging concerning for aspiration PNA -tylenol, ice packs if needed will not start normothermia protocol as was following commands -blood and respiratory cultures -start Zosyn   HTN HLD - holding home amlodipine, coreg, spironolactone, statin for now.   RA - holding methotrexate  Hypothyroidism -TSH WNL, resume synthroid   Best Practice (right click and "Reselect all SmartList Selections" daily)   Diet/type: NPO, if unable to extubate will start TF DVT prophylaxis: prophylactic heparin  GI prophylaxis: PPI Lines: Central line Foley:  Yes, and it is still needed Code Status:  full code Last date of multidisciplinary goals of care discussion [  8/10 : patient does have living will with DNR. Seeing as he has already been resuscitated today and potentially has a favorable prognosis family would like to keep full code. Should he not progress or worsen it would be prudent to re-evaluate]   Family updated at the beside 8/11  Labs   CBC: Recent Labs  Lab 01/17/22 1755 01/17/22 1813 01/17/22 2057 01/17/22 2058 01/18/22 0422  WBC 10.6*  --  15.2*  --  15.3*  HGB 12.8* 13.9 12.7* 12.9* 11.9*  HCT 40.3 41.0 37.7* 38.0* 35.4*  MCV 104.9*  --  99.7  --  100.3*  PLT 237  --  266  --  233     Basic Metabolic Panel: Recent Labs  Lab 01/17/22 1755 01/17/22 1813 01/17/22 2057 01/17/22 2058 01/18/22 0422  NA 136 140 135 135 136  K 5.3* 4.3 5.8* 6.1* 5.3*  CL 105 104 106  --  107  CO2 21*  --  22  --  18*  GLUCOSE 175* 131* 148*  --  201*  BUN 22 26*  24*  --  30*  CREATININE 2.00* 1.80* 2.08*  --  2.40*  CALCIUM 9.2  --  8.7*  --  8.3*  MG  --   --  2.2  --  2.1  PHOS  --   --   --   --  4.0    GFR: Estimated Creatinine Clearance: 28.8 mL/min (A) (by C-G formula based on SCr of 2.4 mg/dL (H)). Recent Labs  Lab 01/17/22 1755 01/17/22 2057 01/18/22 0422  WBC 10.6* 15.2* 15.3*     Liver Function Tests: Recent Labs  Lab 01/17/22 2057  AST 158*  ALT 104*  ALKPHOS 82  BILITOT 1.2  PROT 6.4*  ALBUMIN 3.9   No results for input(s): "LIPASE", "AMYLASE" in the last 168 hours. No results for input(s): "AMMONIA" in the last 168 hours.  ABG    Component Value Date/Time   PHART 7.354  01/17/2022 2058   PCO2ART 41.6 01/17/2022 2058   PO2ART 327 (H) 01/17/2022 2058   HCO3 23.2 01/17/2022 2058   TCO2 24 01/17/2022 2058   ACIDBASEDEF 2.0 01/17/2022 2058   O2SAT 100 01/17/2022 2058     Coagulation Profile: No results for input(s): "INR", "PROTIME" in the last 168 hours.  Cardiac Enzymes: No results for input(s): "CKTOTAL", "CKMB", "CKMBINDEX", "TROPONINI" in the last 168 hours.  HbA1C: No results found for: "HGBA1C"  CBG: No results for input(s): "GLUCAP" in the last 168 hours.  Review of Systems:   Patient is encephalopathic and/or intubated. Therefore history has been obtained from chart review.    Past Medical History:  He,  has no past medical history on file.   Surgical History:  AVR 2019   Social History:      Family History:  His family history is not on file.   Allergies Allergies  Allergen Reactions   Lipitor [Atorvastatin] Other (See Comments)    Myalgia    Zetia [Ezetimibe] Other (See Comments)    Myalgia      Home Medications  Prior to Admission medications   Not on File     Critical care time: 40 minutes    CRITICAL CARE Performed by: Darcella Gasman Layth Cerezo   Total critical care time: 40 minutes  Critical care time was exclusive of separately billable procedures and treating other  patients.  Critical care was necessary to treat or prevent imminent or life-threatening deterioration.  Critical care was time spent personally by me on the following activities: development of treatment plan with patient and/or surrogate as well as nursing, discussions with consultants, evaluation of patient's response to treatment, examination of patient, obtaining history from patient or surrogate, ordering and performing treatments and interventions, ordering and review of laboratory studies, ordering and review of radiographic studies, pulse oximetry and re-evaluation of patient's condition.  Darcella Gasman Aman Bonet, PA-C [facility] See Amion for pager If no response to pager , please call [phone] until 7pm After 7:00 pm call Elink  [phone]

## 2022-01-18 NOTE — Progress Notes (Signed)
Date and time results received: 01/18/22 1645 (use smartphrase ".now" to insert current time)  Test: Lactic Acid  Critical Value: 3.6  Name of Provider Notified: Vernona Rieger Gleason, PA  Orders Received? Or Actions Taken?: LR bolus and repeat Lactic Acid values ordered

## 2022-01-18 NOTE — Progress Notes (Signed)
Progress Note  Patient Name: Eric [name] Date of Encounter: 01/19/2022  [facility] Cardiologist: None   Subjective   Remains intubated and sedated. Has been opening eyes and appears to be responding to family per report.   Remains on Epi 12 Lactate 3.4 Cr improved 1.95>1.56 Maintains good UOP V-paced at 80 TTE with LVEF 60-65%, normal, AoV prosthesis with normal function with mean gradient  Inpatient Medications    Scheduled Meds:  Chlorhexidine Gluconate Cloth  6 each Topical Daily   docusate  100 mg Per Tube BID   heparin  5,000 Units Subcutaneous Q8H   insulin aspart  0-15 Units Subcutaneous Q4H   [START ON 01/20/2022] levothyroxine  88 mcg Per Tube Q0600   midazolam  2 mg Intravenous Once   mouth rinse  15 mL Mouth Rinse Q2H   pantoprazole  40 mg Per Tube Daily   polyethylene glycol  17 g Per Tube Daily   sodium chloride flush  3 mL Intravenous Q12H   Continuous Infusions:  sodium chloride     epinephrine 12 mcg/min (01/19/22 0600)   piperacillin-tazobactam (ZOSYN)  IV 12.5 mL/hr at 01/19/22 0600   propofol (DIPRIVAN) infusion 25 mcg/kg/min (01/19/22 0610)   PRN Meds: sodium chloride, acetaminophen, docusate, fentaNYL (SUBLIMAZE) injection, ondansetron (ZOFRAN) IV, mouth rinse, oxyCODONE, sodium chloride flush   Vital Signs    Vitals:   01/19/22 0434 01/19/22 0500 01/19/22 0530 01/19/22 0600  BP:  120/80 (!) 134/93 117/73  Pulse:  79 80 80  Resp:  (!) 26 (!) 28 (!) 28  Temp:      TempSrc:      SpO2:  95% 95% 96%  Weight: 86.5 kg     Height:        Intake/Output Summary (Last 24 hours) at 01/19/2022 0634 Last data filed at 01/19/2022 0600 Gross per 24 hour  Intake 2359.7 ml  Output 2400 ml  Net -40.3 ml      01/19/2022    4:34 AM 01/18/2022    5:00 AM 01/17/2022    8:00 PM  Last 3 Weights  Weight (lbs) 190 lb 11.2 oz 194 lb 14.2 oz 194 lb 0.1 oz  Weight (kg) 86.5 kg 88.4 kg 88 kg      Telemetry    V-paced - Personally  Reviewed  ECG    No new tracing today - Personally Reviewed  Physical Exam   GEN: Intubated and sedated Neck: Right IJ in place Cardiac: RRR, 1/6 systolic murmur  Respiratory: Coarse vent sounds GI: Soft, nontender, non-distended  MS: Trace-1+ bilateral pitting edema, warm Neuro:  Sedated Psych: Unable to assess  Labs    High Sensitivity Troponin:   Recent Labs  Lab 01/17/22 1755 01/17/22 2057 01/18/22 0422  TROPONINIHS 35* 507* 479*     Chemistry Recent Labs  Lab 01/17/22 2057 01/17/22 2058 01/18/22 0422 01/18/22 1443 01/19/22 0432  NA 135   < > 136 135 136  K 5.8*   < > 5.3* 4.7 4.2  CL 106  --  107 105 107  CO2 22  --  18* 19* 22  GLUCOSE 148*  --  201* 174* 161*  BUN 24*  --  30* 30* 22  CREATININE 2.08*  --  2.40* 1.95* 1.56*  CALCIUM 8.7*  --  8.3* 8.7* 8.3*  MG 2.2  --  2.1  --  2.0  PROT 6.4*  --   --  5.9*  --   ALBUMIN 3.9  --   --  3.3*  --   AST 158*  --   --  70*  --   ALT 104*  --   --  74*  --   ALKPHOS 82  --   --  65  --   BILITOT 1.2  --   --  0.9  --   GFRNONAA 33*  --  27* 35* 46*  ANIONGAP 7  --  11 11 7    < > = values in this interval not displayed.    Lipids  Recent Labs  Lab 01/18/22 0422  TRIG 180*    Hematology Recent Labs  Lab 01/18/22 0422 01/18/22 1443 01/19/22 0432  WBC 15.3* 15.8* 15.0*  RBC 3.53* 3.89* 3.35*  HGB 11.9* 12.9* 11.1*  HCT 35.4* 38.3* 33.8*  MCV 100.3* 98.5 100.9*  MCH 33.7 33.2 33.1  MCHC 33.6 33.7 32.8  RDW 13.2 13.3 13.5  PLT 233 205 164   Thyroid  Recent Labs  Lab 01/17/22 2057  TSH 3.615  FREET4 1.44*    BNPNo results for input(s): "BNP", "PROBNP" in the last 168 hours.  DDimer No results for input(s): "DDIMER" in the last 168 hours.   Radiology    ECHOCARDIOGRAM COMPLETE  Result Date: 01/18/2022    ECHOCARDIOGRAM REPORT   Patient Name:   Eric [name] Date of Exam: 01/18/2022 Medical Rec #:  [id number]      Height:       69.0 in Accession #:    [id number]     Weight:       194.9  lb Date of Birth:  1945-12-02      BSA:          2.043 m Patient Age:    76 years       BP:           111/64 mmHg Patient Gender: M              HR:           80 bpm. Exam Location:  Inpatient Procedure: 2D Echo, Cardiac Doppler and Color Doppler Indications:     Complete heart block  History:         Patient has prior history of Echocardiogram examinations, most                  recent 04/28/2019. Aortic Valve Disease. Temporary pacemaker.                  Complete heart block. Cardiogenic shock. AVR - Edwards Intuity                  Valve Size 23mm.  Sonographer:     Ross Ludwig RDCS (AE) Referring Phys:  Hughie Closs CHRISTOPHER Diagnosing Phys: Armanda Magic MD  Sonographer Comments: Echo performed with patient supine and on artificial respirator. IMPRESSIONS  1. Left ventricular ejection fraction, by estimation, is 60 to 65%. The left ventricle has normal function. The left ventricle has no regional wall motion abnormalities. There is mild concentric left ventricular hypertrophy. Left ventricular diastolic function could not be evaluated.  2. Right ventricular systolic function is normal. The right ventricular size is normal. Tricuspid regurgitation signal is inadequate for assessing PA pressure.  3. The mitral valve is normal in structure. No evidence of mitral valve regurgitation. No evidence of mitral stenosis.  4. The aortic valve was not well visualized. There is a 23 mm Edwards Intuity valve present in the aortic position.     Aortic valve regurgitation  is not visualized. No evidence of aortic valve stenosis. Aortic valve area, by VTI measures 2.33 cm. Aortic valve mean gradient measures 2.0 mmHg. Aortic valve Vmax measures 0.91 m/s.  5. The inferior vena cava is dilated in size with >50% respiratory variability, suggesting right atrial pressure of 8 mmHg. FINDINGS  Left Ventricle: Left ventricular ejection fraction, by estimation, is 60 to 65%. The left ventricle has normal function. The left ventricle has  no regional wall motion abnormalities. The left ventricular internal cavity size was normal in size. There is  mild concentric left ventricular hypertrophy. Left ventricular diastolic function could not be evaluated. Right Ventricle: The right ventricular size is normal. No increase in right ventricular wall thickness. Right ventricular systolic function is normal. Tricuspid regurgitation signal is inadequate for assessing PA pressure. Left Atrium: Left atrial size was normal in size. Right Atrium: Right atrial size was normal in size. Pericardium: There is no evidence of pericardial effusion. Mitral Valve: The mitral valve is normal in structure. No evidence of mitral valve regurgitation. No evidence of mitral valve stenosis. Tricuspid Valve: The tricuspid valve is normal in structure. Tricuspid valve regurgitation is not demonstrated. No evidence of tricuspid stenosis. Aortic Valve: The aortic valve was not well visualized. Aortic valve regurgitation is not visualized. Aortic valve mean gradient measures 2.0 mmHg. Aortic valve peak gradient measures 3.3 mmHg. Aortic valve area, by VTI measures 2.33 cm. There is a 23 mm Edwards Intuity valve present in the aortic position. Pulmonic Valve: The pulmonic valve was normal in structure. Pulmonic valve regurgitation is not visualized. No evidence of pulmonic stenosis. Aorta: The aortic root is normal in size and structure. Venous: The inferior vena cava is dilated in size with greater than 50% respiratory variability, suggesting right atrial pressure of 8 mmHg. IAS/Shunts: No atrial level shunt detected by color flow Doppler.  LEFT VENTRICLE PLAX 2D LVIDd:         3.10 cm   Diastology LVIDs:         2.50 cm   LV e' medial:   4.67 cm/s LV PW:         1.30 cm   LV E/e' medial: 17.0 LV IVS:        1.20 cm LVOT diam:     2.30 cm LV SV:         36 LV SV Index:   18 LVOT Area:     4.15 cm  IVC IVC diam: 2.10 cm LEFT ATRIUM             Index LA diam:        3.30 cm 1.61 cm/m  LA Vol (A2C):   47.3 ml 23.15 ml/m LA Vol (A4C):   42.2 ml 20.65 ml/m LA Biplane Vol: 44.7 ml 21.88 ml/m  AORTIC VALVE AV Area (Vmax):    3.26 cm AV Area (Vmean):   2.81 cm AV Area (VTI):     2.33 cm AV Vmax:           91.10 cm/s AV Vmean:          69.800 cm/s AV VTI:            0.156 m AV Peak Grad:      3.3 mmHg AV Mean Grad:      2.0 mmHg LVOT Vmax:         71.40 cm/s LVOT Vmean:        47.200 cm/s LVOT VTI:  0.087 m LVOT/AV VTI ratio: 0.56  AORTA Ao Root diam: 2.70 cm Ao Asc diam:  2.40 cm MITRAL VALVE MV Area (PHT): 3.05 cm    SHUNTS MV Decel Time: 249 msec    Systemic VTI:  0.09 m MV E velocity: 79.60 cm/s  Systemic Diam: 2.30 cm MV A velocity: 23.40 cm/s MV E/A ratio:  3.40 Armanda Magic MD Electronically signed by Armanda Magic MD Signature Date/Time: 01/18/2022/11:50:54 AM    Final (Updated)    CARDIAC CATHETERIZATION  Result Date: 01/18/2022 Successful placement of temporary pacemaker from the right internal jugular approach without complications.   CT Chest W Contrast  Result Date: 01/17/2022 CLINICAL DATA:  Trauma.  Post CPR EXAM: CT CHEST WITH CONTRAST TECHNIQUE: Multidetector CT imaging of the chest was performed during intravenous contrast administration. RADIATION DOSE REDUCTION: This exam was performed according to the departmental dose-optimization program which includes automated exposure control, adjustment of the mA and/or kV according to patient size and/or use of iterative reconstruction technique. CONTRAST:  75mL OMNIPAQUE IOHEXOL 300 MG/ML  SOLN COMPARISON:  12/31/2017 FINDINGS: Cardiovascular: No filling defects in the pulmonary arteries to suggest pulmonary emboli. Cardiomegaly. Prior aortic valve repair. Coronary artery and aortic calcifications. No aneurysm. Mediastinum/Nodes: Endotracheal tube tip in the midtrachea. No mediastinal, hilar, or axillary adenopathy. Thyroid unremarkable. Lungs/Pleura: Dependent atelectasis in the lower lobes. Rounded ground-glass opacity  in the posterior right upper lobe likely infectious/inflammatory. No effusions. Upper Abdomen: Reflux of contrast into the IVC and hepatic veins compatible with right heart dysfunction. NG tube in the stomach. Musculoskeletal: Chest wall soft tissues are unremarkable. No acute bony abnormality. IMPRESSION: No evidence of pulmonary embolus. Cardiomegaly, coronary artery disease. Dependent atelectasis in the lower lobes. Ground-glass opacity in the posterior right upper lobe is likely infectious/inflammatory. Evidence of right heart dysfunction with reflux of contrast into the IVC and hepatic veins. Aortic Atherosclerosis (ICD10-I70.0). Electronically Signed   By: Charlett Nose M.D.   On: 01/17/2022 18:21   CT Head Wo Contrast  Result Date: 01/17/2022 CLINICAL DATA:  Arrest, status post CPR, intubated EXAM: CT HEAD WITHOUT CONTRAST TECHNIQUE: Contiguous axial images were obtained from the base of the skull through the vertex without intravenous contrast. RADIATION DOSE REDUCTION: This exam was performed according to the departmental dose-optimization program which includes automated exposure control, adjustment of the mA and/or kV according to patient size and/or use of iterative reconstruction technique. COMPARISON:  06/17/2019 FINDINGS: Brain: Stable encephalomalacia inferior left frontal lobe consistent with chronic infarct or prior posttraumatic change. Stable chronic small-vessel ischemic changes throughout the periventricular white matter. No evidence of acute infarct or hemorrhage. Lateral ventricles and midline structures are unremarkable. No acute extra-axial fluid collections. Mass effect. Vascular: No hyperdense vessel or unexpected calcification. Skull: Normal. Negative for fracture or focal lesion. Sinuses/Orbits: No acute finding. Other: None. IMPRESSION: 1. Stable head CT, no acute intracranial process. Electronically Signed   By: Sharlet Salina M.D.   On: 01/17/2022 18:19   DG Abdomen 1  View  Result Date: 01/17/2022 CLINICAL DATA:  Arrest status post CPR, intubated EXAM: ABDOMEN - 1 VIEW COMPARISON:  04/27/2019 FINDINGS: Frontal view of the lower chest and upper abdomen demonstrates enteric catheter tip and side port projecting over the gastric fundus. Bowel gas pattern is unremarkable. Visualized portions of the lung bases are clear. Aortic valve prosthesis is noted. IMPRESSION: 1. Enteric catheter tip projecting over the gastric fundus. Electronically Signed   By: Sharlet Salina M.D.   On: 01/17/2022 17:54   DG Chest  Portable 1 View  Result Date: 01/17/2022 CLINICAL DATA:  cpr status post CPR, intubation and OG tube placement EXAM: PORTABLE CHEST 1 VIEW COMPARISON:  December 28, 2020 FINDINGS: There has been interval intubation with the tip of the endotracheal tube 3.7 cm above the carina and is satisfactory. There has been interval placement of an orogastric tube with its distal end below the diaphragm. Again seen are the post aortic valve replacement changes. Cardiomegaly. Low lung volumes. Lungs are clear. No pneumo or hemothorax. The the visualized skeletal structures are unremarkable. IMPRESSION: There has been interval insertion of an endotracheal and orogastric tube and are in satisfactory position. Cardiomegaly. Low lung volume. Lungs are clear. Electronically Signed   By: Marjo Bicker M.D.   On: 01/17/2022 17:53    Cardiac Studies   TTE 01/18/22: IMPRESSIONS     1. Left ventricular ejection fraction, by estimation, is 60 to 65%. The  left ventricle has normal function. The left ventricle has no regional  wall motion abnormalities. There is mild concentric left ventricular  hypertrophy. Left ventricular diastolic  function could not be evaluated.   2. Right ventricular systolic function is normal. The right ventricular  size is normal. Tricuspid regurgitation signal is inadequate for assessing  PA pressure.   3. The mitral valve is normal in structure. No evidence of  mitral valve  regurgitation. No evidence of mitral stenosis.   4. The aortic valve was not well visualized. There is a 23 mm Edwards  Intuity valve present in the aortic position.      Aortic valve regurgitation is not visualized. No evidence of aortic  valve stenosis. Aortic valve area, by VTI measures 2.33 cm. Aortic valve  mean gradient measures 2.0 mmHg. Aortic valve Vmax measures 0.91 m/s.   5. The inferior vena cava is dilated in size with >50% respiratory  variability, suggesting right atrial pressure of 8 mmHg.    Echo 04/28/2019 1. Left ventricular ejection fraction, by visual estimation, is 60 to  65%. The left ventricle has normal function. There is no left ventricular  hypertrophy.   2. Left ventricular diastolic parameters are consistent with Grade I  diastolic dysfunction (impaired relaxation).   3. Endocardium not imaged well. EF appears normal but cannot completely  exclude mild apical hypokinesis. Can consider repeat echo with Definity  contrast if clinically indicated.   4. Global right ventricle has normal systolic function.The right  ventricular size is normal. No increase in right ventricular wall  thickness.   5. Left atrial size was normal.   6. Right atrial size was normal.   7. Mild mitral annular calcification.   8. The mitral valve is normal in structure. No evidence of mitral valve  regurgitation.   9. The tricuspid valve is normal in structure. Tricuspid valve  regurgitation is trivial.  10. The pulmonic valve was grossly normal. Pulmonic valve regurgitation is  not visualized.  11. Normal pulmonary artery systolic pressure.  12. The aortic valve is tricuspid. Aortic valve regurgitation is not  visualized. Mild aortic valve sclerosis without stenosis.    Monitor 2020 An extended ZIO monitor was performed for 13 days and 17 hours beginning 02/16/2019 to evaluate near syncope.   Baseline rhythm is sinus bundle branch block is seen minimum average and  maximum heart rates are 44 ,65 and 108 bpm.   There were no pauses of 3 seconds or greater and no episodes of high degree AV block or sinus node exit block.   Ventricular ectopy  was rare with isolated PVCs   Supraventricular ectopy was rare with APCs there were 2 brief runs of atrial tachycardia the longest 13 complexes at 128 bpm.  There were no episodes of atrial fibrillation or flutter.   There were 5 triggered and 3 diary events.  1 of the triggered events showed infrequent PVCs all the other triggered and diary events showed sinus rhythm and no arrhythmia.     Conclusion rare ventricular and supraventricular ectopy without bradycardia.  Triggered and symptomatic events are predominantly unassociated with arrhythmia.  2 brief runs of atrial tachycardia asymptomatic.  Patient Profile     76 y.o. male with history of aortic stenosis s/p bioprosthetic AVR in 2019, traumatic SAH, HTN, HLD, RA on MTX, and LBBB who presented after being found down and altered by his wife at home found to be in CHB by EMS. En route to [facility] the patient arrested with ROSC achieved in the ER. Cardiology is now consulted for CHB.  Assessment & Plan    #CHB: #PEA Arrest: #Cardiogenic Shock Patient presented after being found down altered by his wife at home. Per report, patient in CHB on EMS arrival. Received transcutaneous pacing but developed PEA arrest en route. Received CPR with ROSC after 1 round of CPR in ED (unclear total down time). Rhythm in ER consistent with CHB and patient underwent emergent TVP placement last night. Trop 16>109>604. Cath in 2019 with clean coronaries. TTE pending. Notably, was on coreg 12.5mg  BID as outpatient. Currently awaiting 48h of beta blocker washout to reassess underlying rhythm. Pending improvement in neurological status and rhythm after beta blocker washout, will consult EP. -S/p TVP placement; v-paced at 80 -Holding coreg and will reassess underlying rhythm after beta blocker  washout (tomorrow) -Pending neurologic status and rhythm, will consult EP -On epi for BP support; wean as able -Post-arrest care per CCM  #Acute Respiratory Failure: #Suspected Aspiration PNA: -Vent management per CCM -On zosyn  #Severe AS s/p AVR: TTE with normal LVEF 60-65%, normal RV, normal functioning AoV prosthesis with mean gradient  #HTN: -Holding antihypertensives due to need for pressor support  #HLD: -On crestor 10mg  daily at home  Plan discussed at length with his daughter at bedside.  CRITICAL CARE TIME: I have spent a total of 40 minutes with patient reviewing hospital notes, telemetry, EKGs, labs and examining the patient as well as establishing an assessment and plan that was discussed with the patient.  > 50% of time was spent in direct patient care. The patient is critically ill with multi-organ system failure and requires high complexity decision making for assessment and support, frequent evaluation and titration of therapies, application of advanced monitoring technologies and extensive interpretation of multiple databases.     For questions or updates, please contact [facility] Please consult [email or web address] for contact info under        Signed, Meriam Sprague, MD  01/19/2022, 6:34 AM

## 2022-01-18 NOTE — Progress Notes (Signed)
Progress Note  Patient Name: Eric [name] Date of Encounter: 01/18/2022  [facility] Cardiologist: None   Subjective   Intubated and sedated. Reportedly was alert following cath and responded to commands  V-paced on the monitor On epinephrine Cr rising 2.4  Inpatient Medications    Scheduled Meds:  Chlorhexidine Gluconate Cloth  6 each Topical Daily   docusate  100 mg Per Tube BID   heparin  5,000 Units Subcutaneous Q8H   midazolam  2 mg Intravenous Once   pantoprazole  40 mg Per Tube Daily   polyethylene glycol  17 g Per Tube Daily   sodium chloride flush  3 mL Intravenous Q12H   Continuous Infusions:  sodium chloride     epinephrine 17 mcg/min (01/18/22 0523)   propofol (DIPRIVAN) infusion 40 mcg/kg/min (01/18/22 0430)   PRN Meds: sodium chloride, acetaminophen, docusate sodium, fentaNYL (SUBLIMAZE) injection, fentaNYL (SUBLIMAZE) injection, ondansetron (ZOFRAN) IV, oxyCODONE, sodium chloride flush   Vital Signs    Vitals:   01/18/22 0300 01/18/22 0329 01/18/22 0400 01/18/22 0500  BP: (!) 100/58  109/72   Pulse: 80 80 80   Resp: 16 16 16    Temp:   99.1 F (37.3 C)   TempSrc:   Oral   SpO2: 97% 98% 96%   Weight:    88.4 kg  Height:        Intake/Output Summary (Last 24 hours) at 01/18/2022 03/20/2022 Last data filed at 01/18/2022 0400 Gross per 24 hour  Intake 628.63 ml  Output --  Net 628.63 ml      01/18/2022    5:00 AM 01/17/2022    8:00 PM  Last 3 Weights  Weight (lbs) 194 lb 14.2 oz 194 lb 0.1 oz  Weight (kg) 88.4 kg 88 kg      Telemetry    V-paced - Personally Reviewed  ECG    No new tracing - Personally Reviewed  Physical Exam   GEN: Intubated and sedated Neck: Right IJ in place Cardiac: RRR, 1/6 systolic murmur  Respiratory: Coarse vent sounds GI: Soft, nontender, non-distended  MS: No edema; No deformity. Neuro:  Sedated Psych: Unable to assess  Labs    High Sensitivity Troponin:   Recent Labs  Lab 01/17/22 1755  01/17/22 2057 01/18/22 0422  TROPONINIHS 35* 507* 479*     Chemistry Recent Labs  Lab 01/17/22 1755 01/17/22 1813 01/17/22 2057 01/17/22 2058 01/18/22 0422  NA 136 140 135 135 136  K 5.3* 4.3 5.8* 6.1* 5.3*  CL 105 104 106  --  107  CO2 21*  --  22  --  18*  GLUCOSE 175* 131* 148*  --  201*  BUN 22 26* 24*  --  30*  CREATININE 2.00* 1.80* 2.08*  --  2.40*  CALCIUM 9.2  --  8.7*  --  8.3*  MG  --   --  2.2  --  2.1  PROT  --   --  6.4*  --   --   ALBUMIN  --   --  3.9  --   --   AST  --   --  158*  --   --   ALT  --   --  104*  --   --   ALKPHOS  --   --  82  --   --   BILITOT  --   --  1.2  --   --   GFRNONAA 34*  --  33*  --  27*  ANIONGAP 10  --  7  --  11    Lipids  Recent Labs  Lab 01/18/22 0422  TRIG 180*    Hematology Recent Labs  Lab 01/17/22 1755 01/17/22 1813 01/17/22 2057 01/17/22 2058 01/18/22 0422  WBC 10.6*  --  15.2*  --  15.3*  RBC 3.84*  --  3.78*  --  3.53*  HGB 12.8*   < > 12.7* 12.9* 11.9*  HCT 40.3   < > 37.7* 38.0* 35.4*  MCV 104.9*  --  99.7  --  100.3*  MCH 33.3  --  33.6  --  33.7  MCHC 31.8  --  33.7  --  33.6  RDW 13.0  --  13.2  --  13.2  PLT 237  --  266  --  233   < > = values in this interval not displayed.   Thyroid  Recent Labs  Lab 01/17/22 2057  TSH 3.615  FREET4 1.44*    BNPNo results for input(s): "BNP", "PROBNP" in the last 168 hours.  DDimer No results for input(s): "DDIMER" in the last 168 hours.   Radiology    CARDIAC CATHETERIZATION  Result Date: 01/17/2022 Successful placement of temporary pacemaker from the right internal jugular approach without complications.   CT Chest W Contrast  Result Date: 01/17/2022 CLINICAL DATA:  Trauma.  Post CPR EXAM: CT CHEST WITH CONTRAST TECHNIQUE: Multidetector CT imaging of the chest was performed during intravenous contrast administration. RADIATION DOSE REDUCTION: This exam was performed according to the departmental dose-optimization program which includes  automated exposure control, adjustment of the mA and/or kV according to patient size and/or use of iterative reconstruction technique. CONTRAST:  44mL OMNIPAQUE IOHEXOL 300 MG/ML  SOLN COMPARISON:  12/31/2017 FINDINGS: Cardiovascular: No filling defects in the pulmonary arteries to suggest pulmonary emboli. Cardiomegaly. Prior aortic valve repair. Coronary artery and aortic calcifications. No aneurysm. Mediastinum/Nodes: Endotracheal tube tip in the midtrachea. No mediastinal, hilar, or axillary adenopathy. Thyroid unremarkable. Lungs/Pleura: Dependent atelectasis in the lower lobes. Rounded ground-glass opacity in the posterior right upper lobe likely infectious/inflammatory. No effusions. Upper Abdomen: Reflux of contrast into the IVC and hepatic veins compatible with right heart dysfunction. NG tube in the stomach. Musculoskeletal: Chest wall soft tissues are unremarkable. No acute bony abnormality. IMPRESSION: No evidence of pulmonary embolus. Cardiomegaly, coronary artery disease. Dependent atelectasis in the lower lobes. Ground-glass opacity in the posterior right upper lobe is likely infectious/inflammatory. Evidence of right heart dysfunction with reflux of contrast into the IVC and hepatic veins. Aortic Atherosclerosis (ICD10-I70.0). Electronically Signed   By: Charlett Nose M.D.   On: 01/17/2022 18:21   CT Head Wo Contrast  Result Date: 01/17/2022 CLINICAL DATA:  Arrest, status post CPR, intubated EXAM: CT HEAD WITHOUT CONTRAST TECHNIQUE: Contiguous axial images were obtained from the base of the skull through the vertex without intravenous contrast. RADIATION DOSE REDUCTION: This exam was performed according to the departmental dose-optimization program which includes automated exposure control, adjustment of the mA and/or kV according to patient size and/or use of iterative reconstruction technique. COMPARISON:  06/17/2019 FINDINGS: Brain: Stable encephalomalacia inferior left frontal lobe consistent  with chronic infarct or prior posttraumatic change. Stable chronic small-vessel ischemic changes throughout the periventricular white matter. No evidence of acute infarct or hemorrhage. Lateral ventricles and midline structures are unremarkable. No acute extra-axial fluid collections. Mass effect. Vascular: No hyperdense vessel or unexpected calcification. Skull: Normal. Negative for fracture or focal lesion. Sinuses/Orbits: No acute finding. Other: None. IMPRESSION:  1. Stable head CT, no acute intracranial process. Electronically Signed   By: Sharlet Salina M.D.   On: 01/17/2022 18:19   DG Abdomen 1 View  Result Date: 01/17/2022 CLINICAL DATA:  Arrest status post CPR, intubated EXAM: ABDOMEN - 1 VIEW COMPARISON:  04/27/2019 FINDINGS: Frontal view of the lower chest and upper abdomen demonstrates enteric catheter tip and side port projecting over the gastric fundus. Bowel gas pattern is unremarkable. Visualized portions of the lung bases are clear. Aortic valve prosthesis is noted. IMPRESSION: 1. Enteric catheter tip projecting over the gastric fundus. Electronically Signed   By: Sharlet Salina M.D.   On: 01/17/2022 17:54   DG Chest Portable 1 View  Result Date: 01/17/2022 CLINICAL DATA:  cpr status post CPR, intubation and OG tube placement EXAM: PORTABLE CHEST 1 VIEW COMPARISON:  December 28, 2020 FINDINGS: There has been interval intubation with the tip of the endotracheal tube 3.7 cm above the carina and is satisfactory. There has been interval placement of an orogastric tube with its distal end below the diaphragm. Again seen are the post aortic valve replacement changes. Cardiomegaly. Low lung volumes. Lungs are clear. No pneumo or hemothorax. The the visualized skeletal structures are unremarkable. IMPRESSION: There has been interval insertion of an endotracheal and orogastric tube and are in satisfactory position. Cardiomegaly. Low lung volume. Lungs are clear. Electronically Signed   By: Marjo Bicker  M.D.   On: 01/17/2022 17:53    Cardiac Studies   Echo 04/28/2019 1. Left ventricular ejection fraction, by visual estimation, is 60 to  65%. The left ventricle has normal function. There is no left ventricular  hypertrophy.   2. Left ventricular diastolic parameters are consistent with Grade I  diastolic dysfunction (impaired relaxation).   3. Endocardium not imaged well. EF appears normal but cannot completely  exclude mild apical hypokinesis. Can consider repeat echo with Definity  contrast if clinically indicated.   4. Global right ventricle has normal systolic function.The right  ventricular size is normal. No increase in right ventricular wall  thickness.   5. Left atrial size was normal.   6. Right atrial size was normal.   7. Mild mitral annular calcification.   8. The mitral valve is normal in structure. No evidence of mitral valve  regurgitation.   9. The tricuspid valve is normal in structure. Tricuspid valve  regurgitation is trivial.  10. The pulmonic valve was grossly normal. Pulmonic valve regurgitation is  not visualized.  11. Normal pulmonary artery systolic pressure.  12. The aortic valve is tricuspid. Aortic valve regurgitation is not  visualized. Mild aortic valve sclerosis without stenosis.    Monitor 2020 An extended ZIO monitor was performed for 13 days and 17 hours beginning 02/16/2019 to evaluate near syncope.   Baseline rhythm is sinus bundle branch block is seen minimum average and maximum heart rates are 44 ,65 and 108 bpm.   There were no pauses of 3 seconds or greater and no episodes of high degree AV block or sinus node exit block.   Ventricular ectopy was rare with isolated PVCs   Supraventricular ectopy was rare with APCs there were 2 brief runs of atrial tachycardia the longest 13 complexes at 128 bpm.  There were no episodes of atrial fibrillation or flutter.   There were 5 triggered and 3 diary events.  1 of the triggered events showed  infrequent PVCs all the other triggered and diary events showed sinus rhythm and no arrhythmia.  Conclusion rare ventricular and supraventricular ectopy without bradycardia.  Triggered and symptomatic events are predominantly unassociated with arrhythmia.  2 brief runs of atrial tachycardia asymptomatic.  Patient Profile     76 y.o. male with history of aortic stenosis s/p bioprosthetic AVR in 2019, traumatic SAH, HTN, HLD, RA on MTX, and LBBB who presented after being found down and altered by his wife at home found to be in CHB by EMS. En route to [facility] the patient arrested with ROSC achieved in the ER. Cardiology is now consulted for CHB.  Assessment & Plan    #CHB: #PEA Arrest: Patient presented after being found down altered by his wife at home. Per report, patient in CHB on EMS arrival. Received transcutaneous pacing but developed PEA arrest en route. Received CPR with ROSC after 1 round of CPR in ED (unclear total down time). Rhythm in ER consistent with CHB and patient underwent emergent TVP placement last night. Trop [id number]. Cath in 2019 with clean coronaries. TTE pending. Notably, was on coreg 12.5mg  BID as outpatient. Currently awaiting 48h of beta blocker washout to reassess underlying rhythm. Pending improvement in neurological status and rhythm after beta blocker washout, will consult EP. -S/p TVP -Holding coreg and will reassess underlying rhythm after beta blocker washout -Pending neurologic status and rhythm, will consult EP -Follow-up TTE -Suspect PEA arrest secondary to CHB with low suspicion of ACS; cath in 2019 with clean coronaries -Post-arrest care per CCM  #Severe AS s/p AVR: Last TTE 2020 with normal functioning AoV prosthesis with mean gradient .  -Follow-up repeat TTE  #HTN: -Holding antihypertensives due to need for pressor support  #HLD: -On crestor 10mg  daily at home  Plan discussed at length with his daughter at bedside.  CRITICAL CARE  TIME: I have spent a total of 40 minutes with patient reviewing hospital notes, telemetry, EKGs, labs and examining the patient as well as establishing an assessment and plan that was discussed with the patient.  > 50% of time was spent in direct patient care. The patient is critically ill with multi-organ system failure and requires high complexity decision making for assessment and support, frequent evaluation and titration of therapies, application of advanced monitoring technologies and extensive interpretation of multiple databases.     For questions or updates, please contact [facility] Please consult [email or web address] for contact info under        Signed, , MD  01/18/2022, 6:34 AM

## 2022-01-19 DIAGNOSIS — I442 Atrioventricular block, complete: Secondary | ICD-10-CM | POA: Diagnosis not present

## 2022-01-19 DIAGNOSIS — I469 Cardiac arrest, cause unspecified: Secondary | ICD-10-CM | POA: Diagnosis not present

## 2022-01-19 DIAGNOSIS — R570 Cardiogenic shock: Secondary | ICD-10-CM | POA: Diagnosis not present

## 2022-01-19 LAB — CBC
HCT: 33.8 % — ABNORMAL LOW (ref 39.0–52.0)
Hemoglobin: 11.1 g/dL — ABNORMAL LOW (ref 13.0–17.0)
MCH: 33.1 pg (ref 26.0–34.0)
MCHC: 32.8 g/dL (ref 30.0–36.0)
MCV: 100.9 fL — ABNORMAL HIGH (ref 80.0–100.0)
Platelets: 164 10*3/uL (ref 150–400)
RBC: 3.35 MIL/uL — ABNORMAL LOW (ref 4.22–5.81)
RDW: 13.5 % (ref 11.5–15.5)
WBC: 15 10*3/uL — ABNORMAL HIGH (ref 4.0–10.5)
nRBC: 0 % (ref 0.0–0.2)

## 2022-01-19 LAB — BASIC METABOLIC PANEL
Anion gap: 7 (ref 5–15)
BUN: 22 mg/dL (ref 8–23)
CO2: 22 mmol/L (ref 22–32)
Calcium: 8.3 mg/dL — ABNORMAL LOW (ref 8.9–10.3)
Chloride: 107 mmol/L (ref 98–111)
Creatinine, Ser: 1.56 mg/dL — ABNORMAL HIGH (ref 0.61–1.24)
GFR, Estimated: 46 mL/min — ABNORMAL LOW (ref >60)
Glucose, Bld: 161 mg/dL — ABNORMAL HIGH (ref 70–99)
Potassium: 4.2 mmol/L (ref 3.5–5.1)
Sodium: 136 mmol/L (ref 135–145)

## 2022-01-19 LAB — GLUCOSE, CAPILLARY
Glucose-Capillary: 152 mg/dL — ABNORMAL HIGH (ref 70–99)
Glucose-Capillary: 158 mg/dL — ABNORMAL HIGH (ref 70–99)
Glucose-Capillary: 145 mg/dL — ABNORMAL HIGH (ref 70–99)
Glucose-Capillary: 117 mg/dL — ABNORMAL HIGH (ref 70–99)
Glucose-Capillary: 104 mg/dL — ABNORMAL HIGH (ref 70–99)
Glucose-Capillary: 123 mg/dL — ABNORMAL HIGH (ref 70–99)

## 2022-01-19 LAB — LIPOPROTEIN A (LPA): Lipoprotein (a): 24.5 nmol/L (ref <75.0)

## 2022-01-19 LAB — MAGNESIUM: Magnesium: 2 mg/dL (ref 1.7–2.4)

## 2022-01-19 LAB — PHOSPHORUS: Phosphorus: 2.2 mg/dL — ABNORMAL LOW (ref 2.5–4.6)

## 2022-01-19 MED ORDER — LEVOTHYROXINE SODIUM 88 MCG PO TABS: 88.0000 ug | ORAL_TABLET | Freq: Every day | ORAL | Status: DC

## 2022-01-19 MED ORDER — OXYCODONE HCL 5 MG PO TABS
5.0000 mg | ORAL_TABLET | ORAL | Status: DC | PRN
  Administered 2022-01-19: 5 mg
  Filled 2022-01-19: qty 1

## 2022-01-19 MED ORDER — DOCUSATE SODIUM 50 MG/5ML PO LIQD: 100.0000 mg | Freq: Two times a day (BID) | ORAL | Status: DC | PRN

## 2022-01-19 MED ORDER — GUAIFENESIN 100 MG/5ML PO LIQD
5.0000 mL | Freq: Four times a day (QID) | ORAL | Status: DC | PRN
  Administered 2022-01-19: 5 mL via ORAL
  Filled 2022-01-19: qty 5

## 2022-01-19 MED ORDER — ORAL CARE MOUTH RINSE: 15.0000 mL | OROMUCOSAL | Status: DC | PRN

## 2022-01-19 MED ORDER — DOCUSATE SODIUM 100 MG PO CAPS: 100.0000 mg | ORAL_CAPSULE | Freq: Two times a day (BID) | ORAL | Status: DC | PRN

## 2022-01-19 MED ORDER — POLYETHYLENE GLYCOL 3350 17 G PO PACK
17.0000 g | PACK | Freq: Every day | ORAL | Status: DC
  Administered 2022-01-20 – 2022-01-21 (×2): 17 g via ORAL
  Filled 2022-01-19 (×3): qty 1

## 2022-01-19 MED ORDER — K PHOS MONO-SOD PHOS DI & MONO 155-852-130 MG PO TABS
500.0000 mg | ORAL_TABLET | Freq: Once | ORAL | Status: AC
  Administered 2022-01-19: 500 mg via ORAL
  Filled 2022-01-19: qty 2

## 2022-01-19 MED ORDER — OXYCODONE HCL 5 MG PO TABS
5.0000 mg | ORAL_TABLET | ORAL | Status: DC | PRN
  Administered 2022-01-19 (×2): 10 mg via ORAL
  Administered 2022-01-20 – 2022-01-22 (×7): 5 mg via ORAL
  Administered 2022-01-23 (×2): 10 mg via ORAL
  Administered 2022-01-23: 5 mg via ORAL
  Administered 2022-01-23: 10 mg via ORAL
  Filled 2022-01-19: qty 2
  Filled 2022-01-19 (×3): qty 1
  Filled 2022-01-19: qty 2
  Filled 2022-01-19 (×2): qty 1
  Filled 2022-01-19 (×3): qty 2
  Filled 2022-01-19 (×2): qty 1
  Filled 2022-01-19 (×2): qty 2
  Filled 2022-01-19: qty 1

## 2022-01-19 MED ORDER — DOCUSATE SODIUM 100 MG PO CAPS
100.0000 mg | ORAL_CAPSULE | Freq: Two times a day (BID) | ORAL | Status: DC
  Administered 2022-01-20 – 2022-01-25 (×7): 100 mg via ORAL
  Filled 2022-01-19 (×10): qty 1

## 2022-01-19 MED ORDER — K PHOS MONO-SOD PHOS DI & MONO 155-852-130 MG PO TABS
500.0000 mg | ORAL_TABLET | Freq: Once | ORAL | Status: DC
  Filled 2022-01-19: qty 2

## 2022-01-19 MED ORDER — ACETAMINOPHEN 325 MG PO TABS
650.0000 mg | ORAL_TABLET | ORAL | Status: DC | PRN
  Administered 2022-01-19 – 2022-01-23 (×4): 650 mg via ORAL
  Filled 2022-01-19 (×5): qty 2

## 2022-01-19 MED ORDER — ACETAMINOPHEN 325 MG PO TABS
650.0000 mg | ORAL_TABLET | ORAL | Status: DC | PRN
  Administered 2022-01-19: 650 mg
  Filled 2022-01-19: qty 2

## 2022-01-19 MED ORDER — LEVOTHYROXINE SODIUM 88 MCG PO TABS
88.0000 ug | ORAL_TABLET | Freq: Every day | ORAL | Status: DC
  Administered 2022-01-20 – 2022-01-25 (×6): 88 ug via ORAL
  Filled 2022-01-19 (×6): qty 1

## 2022-01-19 NOTE — Progress Notes (Signed)
eLink Physician-Brief Progress Note Patient Name: Eric [name] DOB: 1945/08/19 MRN: [id number]   Date of Service  01/19/2022  HPI/Events of Note  S/p extubation status. On zosyn. Asking for cough suppressants.  eICU Interventions  Robitussin cough syrup ordered prn     Intervention Category Minor Interventions: Other: (Cough meds)  Ranee Gosselin 01/19/2022, 10:39 PM

## 2022-01-19 NOTE — Progress Notes (Signed)
NAME:  Eric [name], MRN:  [id number], DOB:  1946/01/17, LOS: 2 ADMISSION DATE:  01/17/2022, CONSULTATION DATE:  8/10 REFERRING MD:  Dr. Cristal Deer, CHIEF COMPLAINT:  Cardiac arrest   History of Present Illness:  Patient is encephalopathic and/or intubated. Therefore history has been obtained from chart review.   76 year old male with PMH as below, which is significant for aortic stenosis s/p bioprosthetic replacement in 2019, traumatic SAH, HTN, HLD, hypothyroid, RA on methotrexate, and prostate Ca s/p radioactive seeds. He was in his usual state of health on 8/10. Wife left the house to run errands. He did attempt to call her at some point, when she tired to call back he did not answer. She arrived home to find him altered after a fall. EMS was called and upon their arrival he was awake and found to be in complete heart block. He was externally paced for about 10 mins en route, but subsequently lose pulses. CPR initiated and [facility] airway placed. ACLS ongoing upon arrival to ED. ROSC achieved 1st pulse check in ED. 3rd degree block. Cardiology consulted. Epi drip started for shock. Head CT negative post fall. Patient taken to the cath lab for further evaluation. Transvenous pacemaker placed in the cath lab.   Pertinent  Medical History  ortic stenosis s/p bioprosthetic replacement in 2019, traumatic SAH, HTN, HLD, hypothyroid, RA on methotrexate, and prostate Ca s/p radioactive seeds.  Significant Hospital Events: Including procedures, antibiotic start and stop dates in addition to other pertinent events   8/10 cardiac arrest, CHB, transvenous pacer placed. Approx CPR 8/11 Remains intubated, spiked fever,   Interim History / Subjective:   Remains on epi  Off sedation following commands  Temp 100  Objective   Blood pressure (!) 133/107, pulse (!) 101, temperature (!) 100.6 F (38.1 C), temperature source Axillary, resp. rate (!) 30, height 5\' 9"  (1.753 m), weight 86.5 kg, SpO2 95  %.    Vent Mode: PSV;CPAP FiO2 (%):  [40 %-50 %] 40 % Set Rate:  [16 bmp] 16 bmp Vt Set:  [570 mL] 570 mL PEEP:  [5 cmH20] 5 cmH20 Pressure Support:  [14 cmH20] 14 cmH20 Plateau Pressure:  [19 cmH20-21 cmH20] 20 cmH20   Intake/Output Summary (Last 24 hours) at 01/19/2022 6213 Last data filed at 01/19/2022 0700 Gross per 24 hour  Intake 2215.44 ml  Output 2275 ml  Net -59.56 ml   Filed Weights   01/17/22 2000 01/18/22 0500 01/19/22 0434  Weight: 88 kg 88.4 kg 86.5 kg    General: Elderly male intubated on mechanical life support HEENT: M abrasions on left face from fall, endotracheal tube in place Neuro: Alert oriented following commands CV: Regular rate rhythm, S1-S2 PULM: Bilateral mechanically ventilated breath sounds GI: Soft nontender nondistended Extremities: No edema Skin: n no rash GU: Foley in place  Labs reviewed  CT head: nonacute CT chest: No evidence of PE (non-contrasted study), cardiomegaly, CAD, bilateral ATX, GGO RUL, Evidence of right heart dysfunction with reflux of contrast into the IVC and hepatic veins.  Resolved Hospital Problem list     Assessment & Plan:   Cardiac arrest Complete heart block Cardiogenic shock Acute Hypoxic Respiratory Failure s/p transvenous pacemaker 8/10. Possibly due to beta blocker Plan: Appreciate cardiology input. Letting beta-blocker washout. Continue epinephrine infusion. Transvenous pacer in place. Hold sedation Echo reviewed. Wean as p.o. to and PEEP to maintain sats greater than 90%. SBT SAT as tolerated. Hopeful for extubation soon.  AKI Hyperkalemia Plan: Follow urine  output Avoid nephrotoxic agents. Overall seems to be improving.  Fever, aspiration pneumonia New 8/11, imaging concerning for aspiration PNA Plan: Continue Tylenol for fever Continue Zosyn  HTN HLD Plan: Holding home amlodipine, Coreg and spironolactone  RA Plan: Holding home methotrexate in setting of aspiration pneumonia  and fevers.  Hypothyroidism Plan: Resume Synthroid   Best Practice (right click and "Reselect all SmartList Selections" daily)   Diet/type: NPO, if unable to extubate will start TF DVT prophylaxis: prophylactic heparin  GI prophylaxis: PPI Lines: Central line Foley:  Yes, and it is still needed Code Status:  full code Last date of multidisciplinary goals of care discussion [  8/10 : patient does have living will with DNR. Seeing as he has already been resuscitated today and potentially has a favorable prognosis family would like to keep full code. Should he not progress or worsen it would be prudent to re-evaluate]    Family updated at bedside 01/19/2022, son and wife.  Labs   CBC: Recent Labs  Lab 01/17/22 1755 01/17/22 1813 01/17/22 2057 01/17/22 2058 01/18/22 0422 01/18/22 1443 01/19/22 0432  WBC 10.6*  --  15.2*  --  15.3* 15.8* 15.0*  HGB 12.8*   < > 12.7* 12.9* 11.9* 12.9* 11.1*  HCT 40.3   < > 37.7* 38.0* 35.4* 38.3* 33.8*  MCV 104.9*  --  99.7  --  100.3* 98.5 100.9*  PLT 237  --  266  --  233 205 164   < > = values in this interval not displayed.    Basic Metabolic Panel: Recent Labs  Lab 01/17/22 1755 01/17/22 1813 01/17/22 2057 01/17/22 2058 01/18/22 0422 01/18/22 1443 01/19/22 0432  NA 136 140 135 135 136 135 136  K 5.3* 4.3 5.8* 6.1* 5.3* 4.7 4.2  CL 105 104 106  --  107 105 107  CO2 21*  --  22  --  18* 19* 22  GLUCOSE 175* 131* 148*  --  201* 174* 161*  BUN 22 26* 24*  --  30* 30* 22  CREATININE 2.00* 1.80* 2.08*  --  2.40* 1.95* 1.56*  CALCIUM 9.2  --  8.7*  --  8.3* 8.7* 8.3*  MG  --   --  2.2  --  2.1  --  2.0  PHOS  --   --   --   --  4.0  --  2.2*   GFR: Estimated Creatinine Clearance: 43.9 mL/min (A) (by C-G formula based on SCr of 1.56 mg/dL (H)). Recent Labs  Lab 01/17/22 2057 01/18/22 0422 01/18/22 1443 01/18/22 1813 01/19/22 0432  WBC 15.2* 15.3* 15.8*  --  15.0*  LATICACIDVEN  --   --  3.6* 3.4*  --     Liver Function  Tests: Recent Labs  Lab 01/17/22 2057 01/18/22 1443  AST 158* 70*  ALT 104* 74*  ALKPHOS 82 65  BILITOT 1.2 0.9  PROT 6.4* 5.9*  ALBUMIN 3.9 3.3*   No results for input(s): "LIPASE", "AMYLASE" in the last 168 hours. No results for input(s): "AMMONIA" in the last 168 hours.  ABG    Component Value Date/Time   PHART 7.354 01/17/2022 2058   PCO2ART 41.6 01/17/2022 2058   PO2ART 327 (H) 01/17/2022 2058   HCO3 23.2 01/17/2022 2058   TCO2 24 01/17/2022 2058   ACIDBASEDEF 2.0 01/17/2022 2058   O2SAT 100 01/17/2022 2058     Coagulation Profile: No results for input(s): "INR", "PROTIME" in the last 168 hours.  Cardiac Enzymes: No  results for input(s): "CKTOTAL", "CKMB", "CKMBINDEX", "TROPONINI" in the last 168 hours.  HbA1C: Hgb A1c MFr Bld  Date/Time Value Ref Range Status  01/18/2022 04:22 AM 5.5 4.8 - 5.6 % Final    Comment:    (NOTE) Pre diabetes:          5.7%-6.4%  Diabetes:              >6.4%  Glycemic control for   <7.0% adults with diabetes     CBG: Recent Labs  Lab 01/18/22 1539 01/18/22 2036 01/18/22 2356 01/19/22 0426 01/19/22 0812  GLUCAP 145* 172* 150* 152* 158*    Review of Systems:   Patient is encephalopathic and/or intubated. Therefore history has been obtained from chart review.    Past Medical History:  He,  has no past medical history on file.   Surgical History:  AVR 2019   Social History:      Family History:  His family history is not on file.   Allergies Allergies  Allergen Reactions   Lipitor [Atorvastatin] Other (See Comments)    Myalgia    Penicillins Other (See Comments)    Possibly allergic- family not 100% certain   Zetia [Ezetimibe] Other (See Comments)    Myalgia      Home Medications  Prior to Admission medications   Not on File    This patient is critically ill with multiple organ system failure; which, requires frequent high complexity decision making, assessment, support, evaluation, and titration of  therapies. This was completed through the application of advanced monitoring technologies and extensive interpretation of multiple databases. During this encounter critical care time was devoted to patient care services described in this note for 33 minutes.  Josephine Igo, DO [facility] 01/19/2022 9:06 AM

## 2022-01-19 NOTE — Procedures (Signed)
Extubation Procedure Note  Patient Details:   Name: Eric [name] DOB: 1946/04/01 MRN: [id number]   Airway Documentation:    Vent end date: 01/19/22 Vent end time: 0945   Evaluation  O2 sats: stable throughout Complications: No apparent complications Patient did tolerate procedure well. Bilateral Breath Sounds: Clear, Diminished   Yes  Patient had a positive cuff leak. Extubated to 3 LPM nasal cannula. Strong cough. Able to speak name, no stridor noted. Vitals stable. Clear;diminished BBS.   Matti Minney M 01/19/2022, 10:33 AM

## 2022-01-19 NOTE — Progress Notes (Addendum)
Bedside swallow evaluation completed.  No coughing noted with water and applesauce.

## 2022-01-20 DIAGNOSIS — R570 Cardiogenic shock: Secondary | ICD-10-CM | POA: Diagnosis not present

## 2022-01-20 DIAGNOSIS — I469 Cardiac arrest, cause unspecified: Secondary | ICD-10-CM | POA: Diagnosis not present

## 2022-01-20 DIAGNOSIS — I442 Atrioventricular block, complete: Secondary | ICD-10-CM | POA: Diagnosis not present

## 2022-01-20 LAB — CBC
HCT: 32.2 % — ABNORMAL LOW (ref 39.0–52.0)
Hemoglobin: 10.7 g/dL — ABNORMAL LOW (ref 13.0–17.0)
MCH: 33.3 pg (ref 26.0–34.0)
MCHC: 33.2 g/dL (ref 30.0–36.0)
MCV: 100.3 fL — ABNORMAL HIGH (ref 80.0–100.0)
Platelets: 103 10*3/uL — ABNORMAL LOW (ref 150–400)
RBC: 3.21 MIL/uL — ABNORMAL LOW (ref 4.22–5.81)
RDW: 13.5 % (ref 11.5–15.5)
WBC: 8.6 10*3/uL (ref 4.0–10.5)
nRBC: 0 % (ref 0.0–0.2)

## 2022-01-20 LAB — COMPREHENSIVE METABOLIC PANEL
ALT: 45 U/L — ABNORMAL HIGH (ref 0–44)
AST: 45 U/L — ABNORMAL HIGH (ref 15–41)
Albumin: 3.1 g/dL — ABNORMAL LOW (ref 3.5–5.0)
Alkaline Phosphatase: 58 U/L (ref 38–126)
Anion gap: 8 (ref 5–15)
BUN: 20 mg/dL (ref 8–23)
CO2: 24 mmol/L (ref 22–32)
Calcium: 8.4 mg/dL — ABNORMAL LOW (ref 8.9–10.3)
Chloride: 107 mmol/L (ref 98–111)
Creatinine, Ser: 1.5 mg/dL — ABNORMAL HIGH (ref 0.61–1.24)
GFR, Estimated: 48 mL/min — ABNORMAL LOW (ref >60)
Glucose, Bld: 99 mg/dL (ref 70–99)
Potassium: 3.9 mmol/L (ref 3.5–5.1)
Sodium: 139 mmol/L (ref 135–145)
Total Bilirubin: 1.5 mg/dL — ABNORMAL HIGH (ref 0.3–1.2)
Total Protein: 6 g/dL — ABNORMAL LOW (ref 6.5–8.1)

## 2022-01-20 LAB — GLUCOSE, CAPILLARY
Glucose-Capillary: 92 mg/dL (ref 70–99)
Glucose-Capillary: 93 mg/dL (ref 70–99)
Glucose-Capillary: 110 mg/dL — ABNORMAL HIGH (ref 70–99)
Glucose-Capillary: 111 mg/dL — ABNORMAL HIGH (ref 70–99)
Glucose-Capillary: 131 mg/dL — ABNORMAL HIGH (ref 70–99)
Glucose-Capillary: 113 mg/dL — ABNORMAL HIGH (ref 70–99)

## 2022-01-20 LAB — CULTURE, RESPIRATORY W GRAM STAIN: Culture: NORMAL

## 2022-01-20 LAB — PHOSPHORUS: Phosphorus: 3.4 mg/dL (ref 2.5–4.6)

## 2022-01-20 MED ORDER — SODIUM CHLORIDE 0.9 % IV SOLN
2.0000 g | INTRAVENOUS | Status: AC
  Administered 2022-01-20 – 2022-01-22 (×3): 2 g via INTRAVENOUS
  Filled 2022-01-20 (×3): qty 20

## 2022-01-20 NOTE — Progress Notes (Signed)
PT Cancellation Note  Patient Details Name: Eric [name] MRN: [id number] DOB: 1946-06-08   Cancelled Treatment:    Reason Eval/Treat Not Completed: Medical issues which prohibited therapy. Pt with femoral sheath currently. PT will hold until sheath is removed.   Arlyss Gandy 01/20/2022, 5:20 PM

## 2022-01-20 NOTE — Progress Notes (Signed)
eLink Physician-Brief Progress Note Patient Name: Eric [name] DOB: Nov 20, 1945 MRN: [id number]   Date of Service  01/20/2022  HPI/Events of Note  RN noted Pt's hematuria is getting brightr through night, H/H stable 10.7/32.2. Pt has history of prostate procedure, last earlier this year.  eICU Interventions  On sq heparin Discussed with RN. Making urine. Flushes-no issues.  Bladder scan nil. No retension.  Look like local trauma Consider removing foley in AM. S/p extubation over 24 hrs now      Intervention Category Intermediate Interventions: Bleeding - evaluation and treatment with blood products;Other: Sharen Heck)  Ranee Gosselin 01/20/2022, 6:31 AM

## 2022-01-20 NOTE — Progress Notes (Signed)
NAME:  Eric [name], MRN:  [id number], DOB:  Apr 26, 1946, LOS: 3 ADMISSION DATE:  01/17/2022, CONSULTATION DATE:  8/10 REFERRING MD:  Dr. Cristal Deer, CHIEF COMPLAINT:  Cardiac arrest   History of Present Illness:  Patient is encephalopathic and/or intubated. Therefore history has been obtained from chart review.   76 year old male with PMH as below, which is significant for aortic stenosis s/p bioprosthetic replacement in 2019, traumatic SAH, HTN, HLD, hypothyroid, RA on methotrexate, and prostate Ca s/p radioactive seeds. He was in his usual state of health on 8/10. Wife left the house to run errands. He did attempt to call her at some point, when she tired to call back he did not answer. She arrived home to find him altered after a fall. EMS was called and upon their arrival he was awake and found to be in complete heart block. He was externally paced for about 10 mins en route, but subsequently lose pulses. CPR initiated and [facility] airway placed. ACLS ongoing upon arrival to ED. ROSC achieved 1st pulse check in ED. 3rd degree block. Cardiology consulted. Epi drip started for shock. Head CT negative post fall. Patient taken to the cath lab for further evaluation. Transvenous pacemaker placed in the cath lab.   Pertinent  Medical History  ortic stenosis s/p bioprosthetic replacement in 2019, traumatic SAH, HTN, HLD, hypothyroid, RA on methotrexate, and prostate Ca s/p radioactive seeds.  Significant Hospital Events: Including procedures, antibiotic start and stop dates in addition to other pertinent events   8/10 cardiac arrest, CHB, transvenous pacer placed. Approx CPR 8/11 Remains intubated, spiked fever,   Interim History / Subjective:   Remains on low-dose epi Alert following commands Did well postextubation  Objective   Blood pressure 124/73, pulse 80, temperature 98.4 F (36.9 C), temperature source Oral, resp. rate (!) 28, height 5\' 9"  (1.753 m), weight 85.5 kg, SpO2 91 %.         Intake/Output Summary (Last 24 hours) at 01/20/2022 0846 Last data filed at 01/20/2022 0500 Gross per 24 hour  Intake 397.19 ml  Output 1815 ml  Net -1417.81 ml   Filed Weights   01/18/22 0500 01/19/22 0434 01/20/22 0500  Weight: 88.4 kg 86.5 kg 85.5 kg    General: Elderly gentleman alert following commands HEENT: Abrasions on the face Neuro: Alert oriented following commands CV: Regular rate rhythm S1-S2 PULM: Diminished breath sounds bilaterally crackles in the bases GI: Not tender nondistended Extremities: No significant edema Skin: No rash GU: Foley in place  Labs reviewed  CT head: nonacute CT chest: No evidence of PE (non-contrasted study), cardiomegaly, CAD, bilateral ATX, GGO RUL, Evidence of right heart dysfunction with reflux of contrast into the IVC and hepatic veins.  Resolved Hospital Problem list     Assessment & Plan:   Cardiac arrest Complete heart block Cardiogenic shock Acute Hypoxic Respiratory Failure s/p transvenous pacemaker 8/10. Possibly due to beta blocker Plan: Remains in ICU with transvenous pacer. Doing well after extubation Advance diet as tolerated. Up in chair PT OT Still remains on low-dose infusion epinephrine.  AKI Hyperkalemia Plan: Follow urine output Avoid nephrotoxic agents  Fever, aspiration pneumonia New 8/11, imaging concerning for aspiration PNA Plan: Continue Zosyn, Tylenol for fevers  HTN HLD Plan: Holding home amlodipine Coreg and spironolactone  RA Plan: Holding home methotrexate Hypothyroidism Plan: Synthroid   Best Practice (right click and "Reselect all SmartList Selections" daily)   Diet/type: NPO, if unable to extubate will start TF DVT prophylaxis: prophylactic  heparin  GI prophylaxis: PPI Lines: Central line Foley:  Yes, and it is still needed Code Status:  full code Last date of multidisciplinary goals of care discussion   Family updated at bedside 01/20/2022  Labs    CBC: Recent Labs  Lab 01/17/22 2057 01/17/22 2058 01/18/22 0422 01/18/22 1443 01/19/22 0432 01/20/22 0545  WBC 15.2*  --  15.3* 15.8* 15.0* 8.6  HGB 12.7* 12.9* 11.9* 12.9* 11.1* 10.7*  HCT 37.7* 38.0* 35.4* 38.3* 33.8* 32.2*  MCV 99.7  --  100.3* 98.5 100.9* 100.3*  PLT 266  --  233 205 164 103*    Basic Metabolic Panel: Recent Labs  Lab 01/17/22 2057 01/17/22 2058 01/18/22 0422 01/18/22 1443 01/19/22 0432 01/20/22 0545  NA 135 135 136 135 136 139  K 5.8* 6.1* 5.3* 4.7 4.2 3.9  CL 106  --  107 105 107 107  CO2 22  --  18* 19* 22 24  GLUCOSE 148*  --  201* 174* 161* 99  BUN 24*  --  30* 30* 22 20  CREATININE 2.08*  --  2.40* 1.95* 1.56* 1.50*  CALCIUM 8.7*  --  8.3* 8.7* 8.3* 8.4*  MG 2.2  --  2.1  --  2.0  --   PHOS  --   --  4.0  --  2.2* 3.4   GFR: Estimated Creatinine Clearance: 45.4 mL/min (A) (by C-G formula based on SCr of 1.5 mg/dL (H)). Recent Labs  Lab 01/18/22 0422 01/18/22 1443 01/18/22 1813 01/19/22 0432 01/20/22 0545  WBC 15.3* 15.8*  --  15.0* 8.6  LATICACIDVEN  --  3.6* 3.4*  --   --     Liver Function Tests: Recent Labs  Lab 01/17/22 2057 01/18/22 1443 01/20/22 0545  AST 158* 70* 45*  ALT 104* 74* 45*  ALKPHOS 82 65 58  BILITOT 1.2 0.9 1.5*  PROT 6.4* 5.9* 6.0*  ALBUMIN 3.9 3.3* 3.1*   No results for input(s): "LIPASE", "AMYLASE" in the last 168 hours. No results for input(s): "AMMONIA" in the last 168 hours.  ABG    Component Value Date/Time   PHART 7.354 01/17/2022 2058   PCO2ART 41.6 01/17/2022 2058   PO2ART 327 (H) 01/17/2022 2058   HCO3 23.2 01/17/2022 2058   TCO2 24 01/17/2022 2058   ACIDBASEDEF 2.0 01/17/2022 2058   O2SAT 100 01/17/2022 2058     Coagulation Profile: No results for input(s): "INR", "PROTIME" in the last 168 hours.  Cardiac Enzymes: No results for input(s): "CKTOTAL", "CKMB", "CKMBINDEX", "TROPONINI" in the last 168 hours.  HbA1C: Hgb A1c MFr Bld  Date/Time Value Ref Range Status  01/18/2022  04:22 AM 5.5 4.8 - 5.6 % Final    Comment:    (NOTE) Pre diabetes:          5.7%-6.4%  Diabetes:              >6.4%  Glycemic control for   <7.0% adults with diabetes     CBG: Recent Labs  Lab 01/19/22 1551 01/19/22 2018 01/19/22 2310 01/20/22 0414 01/20/22 0753  GLUCAP 117* 104* 123* 92 93    Review of Systems:   Patient is encephalopathic and/or intubated. Therefore history has been obtained from chart review.    Past Medical History:  He,  has no past medical history on file.   Surgical History:  AVR 2019   Social History:      Family History:  His family history is not on file.   Allergies Allergies  Allergen Reactions   Lipitor [Atorvastatin] Other (See Comments)    Myalgia    Penicillins Other (See Comments)    Possibly allergic- family not 100% certain   Zetia [Ezetimibe] Other (See Comments)    Myalgia      Home Medications  Prior to Admission medications   Not on File    This patient is critically ill with multiple organ system failure; which, requires frequent high complexity decision making, assessment, support, evaluation, and titration of therapies. This was completed through the application of advanced monitoring technologies and extensive interpretation of multiple databases. During this encounter critical care time was devoted to patient care services described in this note for 32 minutes.    Josephine Igo, DO [facility] 01/20/2022 8:46 AM

## 2022-01-20 NOTE — Progress Notes (Signed)
Progress Note  Patient Name: Eric [name] Date of Encounter: 01/20/2022  [facility] Cardiologist: None   Subjective   Awake, alert and conversant this morning. Discussed what happened to him during hospitalization. States he has been having episode of syncope and feeling unwell since 2019.   Extubated on 8/12 Epi weaned to 1 Cr stable 1.5  Inpatient Medications    Scheduled Meds:  Chlorhexidine Gluconate Cloth  6 each Topical Daily   docusate sodium  100 mg Oral BID   heparin  5,000 Units Subcutaneous Q8H   insulin aspart  0-15 Units Subcutaneous Q4H   levothyroxine  88 mcg Oral Q0600   midazolam  2 mg Intravenous Once   polyethylene glycol  17 g Oral Daily   sodium chloride flush  3 mL Intravenous Q12H   Continuous Infusions:  sodium chloride     epinephrine Stopped (01/19/22 1628)   piperacillin-tazobactam (ZOSYN)  IV 3.375 g (01/20/22 0527)   PRN Meds: sodium chloride, acetaminophen, docusate sodium, fentaNYL (SUBLIMAZE) injection, guaiFENesin, ondansetron (ZOFRAN) IV, mouth rinse, oxyCODONE, sodium chloride flush   Vital Signs    Vitals:   01/20/22 0420 01/20/22 0430 01/20/22 0458 01/20/22 0500  BP:  (!) 76/55 91/62   Pulse:  80 80   Resp:  (!) 41 (!) 53   Temp: 98.5 F (36.9 C)     TempSrc: Oral     SpO2:  90% 90%   Weight:    85.5 kg  Height:        Intake/Output Summary (Last 24 hours) at 01/20/2022 0629 Last data filed at 01/20/2022 0500 Gross per 24 hour  Intake 524.56 ml  Output 1815 ml  Net -1290.44 ml       01/20/2022    5:00 AM 01/19/2022    4:34 AM 01/18/2022    5:00 AM  Last 3 Weights  Weight (lbs) 188 lb 7.9 oz 190 lb 11.2 oz 194 lb 14.2 oz  Weight (kg) 85.5 kg 86.5 kg 88.4 kg      Telemetry    V-paced at 80, runs of intrinsic rhythm with HR 90s; one 12 beat run NSVT- Personally Reviewed  ECG    No new tracing today - Personally Reviewed  Physical Exam   GEN: Awake, alert, conversant Neck: Right IJ in place Cardiac:  RRR, 1/6 systolic murmur  Respiratory: Diminished at bases GI: Soft, nontender, non-distended  MS: Trace edema, warm Neuro:  AAOx3 Psych: Normal affect  Labs    High Sensitivity Troponin:   Recent Labs  Lab 01/17/22 1755 01/17/22 2057 01/18/22 0422  TROPONINIHS 35* 507* 479*      Chemistry Recent Labs  Lab 01/17/22 2057 01/17/22 2058 01/18/22 0422 01/18/22 1443 01/19/22 0432 01/20/22 0545  NA 135   < > 136 135 136 139  K 5.8*   < > 5.3* 4.7 4.2 3.9  CL 106  --  107 105 107 107  CO2 22  --  18* 19* 22 24  GLUCOSE 148*  --  201* 174* 161* 99  BUN 24*  --  30* 30* 22 20  CREATININE 2.08*  --  2.40* 1.95* 1.56* 1.50*  CALCIUM 8.7*  --  8.3* 8.7* 8.3* 8.4*  MG 2.2  --  2.1  --  2.0  --   PROT 6.4*  --   --  5.9*  --  6.0*  ALBUMIN 3.9  --   --  3.3*  --  3.1*  AST 158*  --   --  70*  --  45*  ALT 104*  --   --  74*  --  45*  ALKPHOS 82  --   --  65  --  58  BILITOT 1.2  --   --  0.9  --  1.5*  GFRNONAA 33*  --  27* 35* 46* 48*  ANIONGAP 7  --  11 11 7 8    < > = values in this interval not displayed.     Lipids  Recent Labs  Lab 01/18/22 0422  TRIG 180*     Hematology Recent Labs  Lab 01/18/22 1443 01/19/22 0432 01/20/22 0545  WBC 15.8* 15.0* 8.6  RBC 3.89* 3.35* 3.21*  HGB 12.9* 11.1* 10.7*  HCT 38.3* 33.8* 32.2*  MCV 98.5 100.9* 100.3*  MCH 33.2 33.1 33.3  MCHC 33.7 32.8 33.2  RDW 13.3 13.5 13.5  PLT 205 164 103*    Thyroid  Recent Labs  Lab 01/17/22 2057  TSH 3.615  FREET4 1.44*     BNPNo results for input(s): "BNP", "PROBNP" in the last 168 hours.  DDimer No results for input(s): "DDIMER" in the last 168 hours.   Radiology    ECHOCARDIOGRAM COMPLETE  Result Date: 01/18/2022    ECHOCARDIOGRAM REPORT   Patient Name:   Eric [name] Date of Exam: 01/18/2022 Medical Rec #:  [id number]      Height:       69.0 in Accession #:    [id number]     Weight:       194.9 lb Date of Birth:  [birth year]      BSA:          2.043 m Patient Age:    76  years       BP:           111/64 mmHg Patient Gender: M              HR:           80 bpm. Exam Location:  Inpatient Procedure: 2D Echo, Cardiac Doppler and Color Doppler Indications:     Complete heart block  History:         Patient has prior history of Echocardiogram examinations, most                  recent 04/28/2019. Aortic Valve Disease. Temporary pacemaker.                  Complete heart block. Cardiogenic shock. AVR - Edwards Intuity                  Valve Size 23mm.  Sonographer:     Ross Ludwig RDCS (AE) Referring Phys:  Hughie Closs CHRISTOPHER Diagnosing Phys: Armanda Magic MD  Sonographer Comments: Echo performed with patient supine and on artificial respirator. IMPRESSIONS  1. Left ventricular ejection fraction, by estimation, is 60 to 65%. The left ventricle has normal function. The left ventricle has no regional wall motion abnormalities. There is mild concentric left ventricular hypertrophy. Left ventricular diastolic function could not be evaluated.  2. Right ventricular systolic function is normal. The right ventricular size is normal. Tricuspid regurgitation signal is inadequate for assessing PA pressure.  3. The mitral valve is normal in structure. No evidence of mitral valve regurgitation. No evidence of mitral stenosis.  4. The aortic valve was not well visualized. There is a 23 mm Edwards Intuity valve present in the aortic position.     Aortic valve regurgitation is not visualized.  No evidence of aortic valve stenosis. Aortic valve area, by VTI measures 2.33 cm. Aortic valve mean gradient measures 2.0 mmHg. Aortic valve Vmax measures 0.91 m/s.  5. The inferior vena cava is dilated in size with >50% respiratory variability, suggesting right atrial pressure of 8 mmHg. FINDINGS  Left Ventricle: Left ventricular ejection fraction, by estimation, is 60 to 65%. The left ventricle has normal function. The left ventricle has no regional wall motion abnormalities. The left ventricular internal cavity  size was normal in size. There is  mild concentric left ventricular hypertrophy. Left ventricular diastolic function could not be evaluated. Right Ventricle: The right ventricular size is normal. No increase in right ventricular wall thickness. Right ventricular systolic function is normal. Tricuspid regurgitation signal is inadequate for assessing PA pressure. Left Atrium: Left atrial size was normal in size. Right Atrium: Right atrial size was normal in size. Pericardium: There is no evidence of pericardial effusion. Mitral Valve: The mitral valve is normal in structure. No evidence of mitral valve regurgitation. No evidence of mitral valve stenosis. Tricuspid Valve: The tricuspid valve is normal in structure. Tricuspid valve regurgitation is not demonstrated. No evidence of tricuspid stenosis. Aortic Valve: The aortic valve was not well visualized. Aortic valve regurgitation is not visualized. Aortic valve mean gradient measures 2.0 mmHg. Aortic valve peak gradient measures 3.3 mmHg. Aortic valve area, by VTI measures 2.33 cm. There is a 23 mm Edwards Intuity valve present in the aortic position. Pulmonic Valve: The pulmonic valve was normal in structure. Pulmonic valve regurgitation is not visualized. No evidence of pulmonic stenosis. Aorta: The aortic root is normal in size and structure. Venous: The inferior vena cava is dilated in size with greater than 50% respiratory variability, suggesting right atrial pressure of 8 mmHg. IAS/Shunts: No atrial level shunt detected by color flow Doppler.  LEFT VENTRICLE PLAX 2D LVIDd:         3.10 cm   Diastology LVIDs:         2.50 cm   LV e' medial:   4.67 cm/s LV PW:         1.30 cm   LV E/e' medial: 17.0 LV IVS:        1.20 cm LVOT diam:     2.30 cm LV SV:         36 LV SV Index:   18 LVOT Area:     4.15 cm  IVC IVC diam: 2.10 cm LEFT ATRIUM             Index LA diam:        3.30 cm 1.61 cm/m LA Vol (A2C):   47.3 ml 23.15 ml/m LA Vol (A4C):   42.2 ml 20.65 ml/m LA  Biplane Vol: 44.7 ml 21.88 ml/m  AORTIC VALVE AV Area (Vmax):    3.26 cm AV Area (Vmean):   2.81 cm AV Area (VTI):     2.33 cm AV Vmax:           91.10 cm/s AV Vmean:          69.800 cm/s AV VTI:            0.156 m AV Peak Grad:      3.3 mmHg AV Mean Grad:      2.0 mmHg LVOT Vmax:         71.40 cm/s LVOT Vmean:        47.200 cm/s LVOT VTI:          0.087 m LVOT/AV  VTI ratio: 0.56  AORTA Ao Root diam: 2.70 cm Ao Asc diam:  2.40 cm MITRAL VALVE MV Area (PHT): 3.05 cm    SHUNTS MV Decel Time: 249 msec    Systemic VTI:  0.09 m MV E velocity: 79.60 cm/s  Systemic Diam: 2.30 cm MV A velocity: 23.40 cm/s MV E/A ratio:  3.40 Armanda Magic MD Electronically signed by Armanda Magic MD Signature Date/Time: 01/18/2022/11:50:54 AM    Final (Updated)     Cardiac Studies   TTE 01/18/22: IMPRESSIONS     1. Left ventricular ejection fraction, by estimation, is 60 to 65%. The  left ventricle has normal function. The left ventricle has no regional  wall motion abnormalities. There is mild concentric left ventricular  hypertrophy. Left ventricular diastolic  function could not be evaluated.   2. Right ventricular systolic function is normal. The right ventricular  size is normal. Tricuspid regurgitation signal is inadequate for assessing  PA pressure.   3. The mitral valve is normal in structure. No evidence of mitral valve  regurgitation. No evidence of mitral stenosis.   4. The aortic valve was not well visualized. There is a 23 mm Edwards  Intuity valve present in the aortic position.      Aortic valve regurgitation is not visualized. No evidence of aortic  valve stenosis. Aortic valve area, by VTI measures 2.33 cm. Aortic valve  mean gradient measures 2.0 mmHg. Aortic valve Vmax measures 0.91 m/s.   5. The inferior vena cava is dilated in size with >50% respiratory  variability, suggesting right atrial pressure of 8 mmHg.    Echo 04/28/2019 1. Left ventricular ejection fraction, by visual estimation,  is 60 to  65%. The left ventricle has normal function. There is no left ventricular  hypertrophy.   2. Left ventricular diastolic parameters are consistent with Grade I  diastolic dysfunction (impaired relaxation).   3. Endocardium not imaged well. EF appears normal but cannot completely  exclude mild apical hypokinesis. Can consider repeat echo with Definity  contrast if clinically indicated.   4. Global right ventricle has normal systolic function.The right  ventricular size is normal. No increase in right ventricular wall  thickness.   5. Left atrial size was normal.   6. Right atrial size was normal.   7. Mild mitral annular calcification.   8. The mitral valve is normal in structure. No evidence of mitral valve  regurgitation.   9. The tricuspid valve is normal in structure. Tricuspid valve  regurgitation is trivial.  10. The pulmonic valve was grossly normal. Pulmonic valve regurgitation is  not visualized.  11. Normal pulmonary artery systolic pressure.  12. The aortic valve is tricuspid. Aortic valve regurgitation is not  visualized. Mild aortic valve sclerosis without stenosis.    Monitor 2020 An extended ZIO monitor was performed for 13 days and 17 hours beginning 02/16/2019 to evaluate near syncope.   Baseline rhythm is sinus bundle branch block is seen minimum average and maximum heart rates are 44 ,65 and 108 bpm.   There were no pauses of 3 seconds or greater and no episodes of high degree AV block or sinus node exit block.   Ventricular ectopy was rare with isolated PVCs   Supraventricular ectopy was rare with APCs there were 2 brief runs of atrial tachycardia the longest 13 complexes at 128 bpm.  There were no episodes of atrial fibrillation or flutter.   There were 5 triggered and 3 diary events.  1 of the triggered  events showed infrequent PVCs all the other triggered and diary events showed sinus rhythm and no arrhythmia.     Conclusion rare ventricular and  supraventricular ectopy without bradycardia.  Triggered and symptomatic events are predominantly unassociated with arrhythmia.  2 brief runs of atrial tachycardia asymptomatic.  Patient Profile     76 y.o. male with history of aortic stenosis s/p bioprosthetic AVR in 2019, traumatic SAH, HTN, HLD, RA on MTX, and LBBB who presented after being found down and altered by his wife at home found to be in CHB by EMS. En route to [facility] the patient arrested with ROSC achieved in the ER. Cardiology is now consulted for CHB.  Assessment & Plan    #CHB: #PEA Arrest: #Cardiogenic Shock Patient presented after being found down altered by his wife at home. Per report, patient in CHB on EMS arrival. Received transcutaneous pacing but developed PEA arrest en route. Received CPR with ROSC after 1 round of CPR in ED (unclear total down time). Rhythm in ER consistent with CHB and patient underwent emergent TVP placement lon admission. Trop 16>109>604. Cath in 2019 with clean coronaries. TTE 01/18/22 with normal BiV function. Notably, was on coreg 12.5mg  BID as outpatient and is now s/p 48h washout. Patient is extubated and responding. Will consult EP on Monday for consideration of PPM placement. -Consult EP to evaluate on Monday for PPM; has been having syncope since AVR in 2019 suggesting he has been having episodes of CHB prior to presentation -S/p TVP placement;  will decrease pacing rate to 40 and monitor -Coreg held for >48h -Wean epi as tolerated -Appreciate CCM recommendations  #Acute Respiratory Failure: #Suspected Aspiration PNA: -Extubated 8/12 -On zosyn  #Severe AS s/p AVR: TTE with normal LVEF 60-65%, normal RV, normal functioning AoV prosthesis with mean gradient  #HTN: -Holding antihypertensives due to need for pressor support  #HLD: -On crestor 10mg  daily at home  Family and patient updated at length at bedside. Questions answered. Will have EP evaluate tomorrow.   CRITICAL CARE  TIME: I have spent a total of 45 minutes with patient reviewing hospital notes, telemetry, EKGs, labs and examining the patient as well as establishing an assessment and plan that was discussed with the patient.  > 50% of time was spent in direct patient care. The patient is critically ill with multi-organ system failure and requires high complexity decision making for assessment and support, frequent evaluation and titration of therapies, application of advanced monitoring technologies and extensive interpretation of multiple databases.     For questions or updates, please contact [facility] Please consult [email or web address] for contact info under        Signed, Meriam Sprague, MD  01/20/2022, 6:29 AM

## 2022-01-20 NOTE — Progress Notes (Signed)
Tolerating thin liquids, applesauce and crushed medications. Having difficulty with medications that can not be crushed.

## 2022-01-21 DIAGNOSIS — I442 Atrioventricular block, complete: Secondary | ICD-10-CM | POA: Diagnosis not present

## 2022-01-21 LAB — BASIC METABOLIC PANEL
Anion gap: 10 (ref 5–15)
BUN: 21 mg/dL (ref 8–23)
CO2: 22 mmol/L (ref 22–32)
Calcium: 8.4 mg/dL — ABNORMAL LOW (ref 8.9–10.3)
Chloride: 106 mmol/L (ref 98–111)
Creatinine, Ser: 1.14 mg/dL (ref 0.61–1.24)
GFR, Estimated: >60 mL/min (ref >60)
Glucose, Bld: 115 mg/dL — ABNORMAL HIGH (ref 70–99)
Potassium: 3.5 mmol/L (ref 3.5–5.1)
Sodium: 138 mmol/L (ref 135–145)

## 2022-01-21 LAB — GLUCOSE, CAPILLARY
Glucose-Capillary: 103 mg/dL — ABNORMAL HIGH (ref 70–99)
Glucose-Capillary: 115 mg/dL — ABNORMAL HIGH (ref 70–99)
Glucose-Capillary: 120 mg/dL — ABNORMAL HIGH (ref 70–99)
Glucose-Capillary: 123 mg/dL — ABNORMAL HIGH (ref 70–99)
Glucose-Capillary: 92 mg/dL (ref 70–99)
Glucose-Capillary: 98 mg/dL (ref 70–99)

## 2022-01-21 LAB — TRIGLYCERIDES: Triglycerides: 157 mg/dL — ABNORMAL HIGH (ref <150)

## 2022-01-21 MED ORDER — SODIUM CHLORIDE 0.9 % IV SOLN
12.5000 mg | Freq: Once | INTRAVENOUS | Status: AC
  Administered 2022-01-21: 12.5 mg via INTRAVENOUS
  Filled 2022-01-21: qty 0.5

## 2022-01-21 MED ORDER — POTASSIUM CHLORIDE CRYS ER 20 MEQ PO TBCR
40.0000 meq | EXTENDED_RELEASE_TABLET | Freq: Once | ORAL | Status: AC
Start: 2022-01-21 — End: 2022-01-21
  Administered 2022-01-21: 40 meq via ORAL
  Filled 2022-01-21: qty 2

## 2022-01-21 NOTE — Evaluation (Signed)
Physical Therapy Evaluation Patient Details Name: Eric [name] MRN: [id number] DOB: 04-11-1946 Today's Date: 01/21/2022  History of Present Illness  76 y.o. male presents to [facility] hospital on 01/17/2022 with AMS s/p a fall. Pt found to be in complete heart block, losing pulse en route and requiring CPR initiation. Pt underwent transvenous pacer placement in cath lab on 8/10. Imaging concerning for aspiration PNA. Pt extubated on 01/19/2022. PMH includes aortic stenosis s/p biprosthetic replacement in 2019, SAH, HTN, RA, progstate [place].   Clinical Impression  Pt admitted with above diagnosis. PTA pt lived at home with his wife, independent and driving.  Pt currently with functional limitations due to the deficits listed below (see PT Problem List). On eval, pt required min assist bed mobility, min assist sit to stand, and +2 HH/min assist sidestepping bedside. Mobility limited by nausea. Pt primary c/o is chest pain/soreness from 10 minutes of CPR during admitting episode. Pt will benefit from skilled PT to increase their independence and safety with mobility to allow discharge to the venue listed below.  Suspect pt will progress quickly with mobility. He has good family support to provide needed level of assist at home. Family to confirm pt has all needed DME.        Recommendations for follow up therapy are one component of a multi-disciplinary discharge planning process, led by the attending physician.  Recommendations may be updated based on patient status, additional functional criteria and insurance authorization.  Follow Up Recommendations Home health PT      Assistance Recommended at Discharge Frequent or constant Supervision/Assistance  Patient can return home with the following  A little help with walking and/or transfers;Assistance with cooking/housework;Assist for transportation;A little help with bathing/dressing/bathroom;Help with stairs or ramp for entrance    Equipment  Recommendations None recommended by PT  Recommendations for Other Services       Functional Status Assessment Patient has had a recent decline in their functional status and demonstrates the ability to make significant improvements in function in a reasonable and predictable amount of time.     Precautions / Restrictions Precautions Precautions: Fall;Other (comment) Precaution Comments: external pacer, when he laid down he said he was dizzy and monitor said unsustained v-tach Restrictions Weight Bearing Restrictions: No      Mobility  Bed Mobility Overal bed mobility: Needs Assistance Bed Mobility: Supine to Sit, Sit to Supine     Supine to sit: Min assist, HOB elevated, +2 for safety/equipment Sit to supine: Min assist, HOB elevated, +2 for safety/equipment        Transfers Overall transfer level: Needs assistance Equipment used: 2 person hand held assist Transfers: Sit to/from Stand Sit to Stand: Min assist, +2 safety/equipment                Ambulation/Gait Ambulation/Gait assistance: +2 safety/equipment, Min assist   Assistive device: 2 person hand held assist         General Gait Details: sidestepping bedside  Stairs            Wheelchair Mobility    Modified Rankin (Stroke Patients Only)       Balance Overall balance assessment: Needs assistance Sitting-balance support: No upper extremity supported, Feet supported Sitting balance-Leahy Scale: Good     Standing balance support: Bilateral upper extremity supported, During functional activity Standing balance-Leahy Scale: Poor Standing balance comment: reliant on external support  Pertinent Vitals/Pain Pain Assessment Pain Assessment: Faces Faces Pain Scale: Hurts whole lot Pain Location: chest, ribs from CPR Pain Descriptors / Indicators: Aching, Sore, Sharp, Shooting Pain Intervention(s): Limited activity within patient's tolerance, Monitored  during session, Repositioned    Home Living Family/patient expects to be discharged to:: Private residence Living Arrangements: Spouse/significant other Available Help at Discharge: Family;Available 24 hours/day Type of Home: House Home Access: Stairs to enter   [employer] of Steps: 1 (threshold)   Home Layout: One level Home Equipment: Agricultural consultant (2 wheels);Rollator (4 wheels);Shower seat      Prior Function Prior Level of Function : Independent/Modified Independent;Driving                     Hand Dominance   Dominant Hand: Right    Extremity/Trunk Assessment   Upper Extremity Assessment Upper Extremity Assessment: Overall WFL for tasks assessed    Lower Extremity Assessment Lower Extremity Assessment: Overall WFL for tasks assessed       Communication   Communication: No difficulties  Cognition Arousal/Alertness: Awake/alert Behavior During Therapy: WFL for tasks assessed/performed Overall Cognitive Status: Within Functional Limits for tasks assessed                                          General Comments General comments (skin integrity, edema, etc.): Pt with reports of nausea, declined sitting in recliner. SpO2 98% on 2L. Pt provided with inspirometer.    Exercises     Assessment/Plan    PT Assessment Patient needs continued PT services  PT Problem List Decreased mobility;Decreased activity tolerance;Cardiopulmonary status limiting activity;Decreased balance;Pain;Decreased knowledge of use of DME       PT Treatment Interventions DME instruction;Therapeutic activities;Gait training;Therapeutic exercise;Patient/family education;Balance training;Stair training;Functional mobility training    PT Goals (Current goals can be found in the Care Plan section)  Acute Rehab PT Goals Patient Stated Goal: home PT Goal Formulation: With patient/family Time For Goal Achievement: 02/04/22 Potential to Achieve Goals: Good     Frequency Min 3X/week     Co-evaluation PT/OT/SLP Co-Evaluation/Treatment: Yes Reason for Co-Treatment: For patient/therapist safety;To address functional/ADL transfers PT goals addressed during session: Mobility/safety with mobility;Balance OT goals addressed during session: Strengthening/ROM;ADL's and self-care       AM-PAC PT "6 Clicks" Mobility  Outcome Measure Help needed turning from your back to your side while in a flat bed without using bedrails?: A Little Help needed moving from lying on your back to sitting on the side of a flat bed without using bedrails?: A Little Help needed moving to and from a bed to a chair (including a wheelchair)?: A Little Help needed standing up from a chair using your arms (e.g., wheelchair or bedside chair)?: A Little Help needed to walk in hospital room?: A Lot Help needed climbing 3-5 steps with a railing? : A Lot 6 Click Score: 16    End of Session Equipment Utilized During Treatment: Oxygen Activity Tolerance: Patient tolerated treatment well Patient left: in bed;with call bell/phone within reach;with family/visitor present Nurse Communication: Mobility status PT Visit Diagnosis: Difficulty in walking, not elsewhere classified (R26.2);Unsteadiness on feet (R26.81);Pain    Time: 1610-9604 PT Time Calculation (min) (ACUTE ONLY): 41 min   Charges:   PT Evaluation $PT Eval Moderate Complexity: 1 Mod          Aida Raider, PT  Office # [phone] Pager [phone]  Ilda Foil 01/21/2022, 12:53 PM

## 2022-01-21 NOTE — Evaluation (Signed)
Clinical/Bedside Swallow Evaluation Patient Details  Name: Eric [name] MRN: [id number] Date of Birth: 29-Oct-1945  Today's Date: 01/21/2022 Time: SLP Start Time (ACUTE ONLY): 0900 SLP Stop Time (ACUTE ONLY): 0914 SLP Time Calculation (min) (ACUTE ONLY): 14 min  Past Medical History: No past medical history on file. HPI:  76 year old male admitted 8/10 after cardiac arrest; CPR/intubated per EMS en route to [facility]. Extubated 8/12. PMH significant for aortic stenosis s/p bioprosthetic replacement in 2019, traumatic SAH, HTN, HLD, hypothyroid, RA, and prostate Ca s/p radioactive seeds.    Assessment / Plan / Recommendation  Clinical Impression  Pt participated in limited swallowing assessment due to feeling unwell today, slightly nauseous. Oral mechanism exam normal excluding bruising on tongue bilaterally secondary to oral intubation. Reports intermittent sensation of pressure left side of pharynx after oral med lodged there yesterday. Voice is strong/good quality. He demonstrated active oral manipulation of purees and thin liquids; swallow response appeared to be brisk. There were no s/s of aspiration or an oropharyngeal dysphagia.  Recommend continuing current diet, thin liquids as tolerated.  No SLP issues identified. D/W pt, RN. Our service will sign off. SLP Visit Diagnosis: Dysphagia, unspecified (R13.10)    Aspiration Risk  No limitations    Diet Recommendation   Regular solids, thin liquids  Medication Administration: Whole meds with puree    Other  Recommendations Oral Care Recommendations: Oral care BID    Recommendations for follow up therapy are one component of a multi-disciplinary discharge planning process, led by the attending physician.  Recommendations may be updated based on patient status, additional functional criteria and insurance authorization.  Follow up Recommendations No SLP follow up        Swallow Study   General Date of Onset: 01/17/22 HPI: 76 year old  male admitted 8/10 after cardiac arrest; CPR/intubated per EMS en route to [facility]. Extubated 8/12. PMH significant for aortic stenosis s/p bioprosthetic replacement in 2019, traumatic SAH, HTN, HLD, hypothyroid, RA, and prostate Ca s/p radioactive seeds. Type of Study: Bedside Swallow Evaluation Previous Swallow Assessment: no Diet Prior to this Study: Regular;Thin liquids Temperature Spikes Noted: No Respiratory Status: Nasal cannula History of Recent Intubation: Yes Length of Intubations (days): 2 days Date extubated: 01/19/22 Behavior/Cognition: Alert Oral Cavity Assessment: Within Functional Limits Oral Care Completed by SLP: No Oral Cavity - Dentition: Dentures, top;Dentures, bottom Vision: Functional for self-feeding Self-Feeding Abilities: Able to feed self Patient Positioning: Upright in bed Baseline Vocal Quality: Normal Volitional Cough: Strong Volitional Swallow: Able to elicit    Oral/Motor/Sensory Function Overall Oral Motor/Sensory Function: Within functional limits (bruising sides of tongue)   Ice Chips Ice chips: Within functional limits   Thin Liquid Thin Liquid: Within functional limits    Nectar Thick Nectar Thick Liquid: Not tested   Honey Thick Honey Thick Liquid: Not tested   Puree Puree: Within functional limits   Solid     Solid: Not tested      Blenda Mounts Laurice 01/21/2022,9:18 AM  Marchelle Folks L. Samson Frederic, MA CCC/SLP Clinical Specialist - Acute Care SLP Acute Rehabilitation Services Office number [phone]

## 2022-01-21 NOTE — Evaluation (Signed)
Occupational Therapy Evaluation Patient Details Name: Eric [name] MRN: [id number] DOB: 12-10-1945 Today's Date: 01/21/2022   History of Present Illness 76 y.o. male presents to [facility] hospital on 01/17/2022 with AMS s/p a fall. Pt found to be in complete heart block, losing pulse en route and requiring CPR initiation. Pt underwent transvenous pacer placement in cath lab on 8/10. Imaging concerning for aspiration PNA. Pt extubated on 01/19/2022. PMH includes aortic stenosis s/p biprosthetic replacement in 2019, SAH, HTN, RA, prog[employer] [place].   Clinical Impression   This 76 yo male admitted with above presents to acute OT with PLOF of being totally independent with BADLs, IADLs (limited outside work), and driving. He currently is setup/S-Max A for basic ADLs and min A+2 safety for bed mobility, transfers, and side stepping up towards HOB with Bil HHA. He will continue to benefit from acute OT with follow up HHOT.      Recommendations for follow up therapy are one component of a multi-disciplinary discharge planning process, led by the attending physician.  Recommendations may be updated based on patient status, additional functional criteria and insurance authorization.   Follow Up Recommendations  Home health OT    Assistance Recommended at Discharge Frequent or constant Supervision/Assistance  Patient can return home with the following A little help with walking and/or transfers;A lot of help with bathing/dressing/bathroom;Assistance with cooking/housework;Assist for transportation    Functional Status Assessment  Patient has had a recent decline in their functional status and demonstrates the ability to make significant improvements in function in a reasonable and predictable amount of time.  Equipment Recommendations  None recommended by OT       Precautions / Restrictions Precautions Precautions: Fall Precaution Comments: external pacer Restrictions Weight Bearing Restrictions: No       Mobility Bed Mobility Overal bed mobility: Needs Assistance Bed Mobility: Supine to Sit, Sit to Supine     Supine to sit: Min assist, HOB elevated, +2 for safety/equipment Sit to supine: Min assist, HOB elevated, +2 for safety/equipment        Transfers Overall transfer level: Needs assistance Equipment used: 2 person hand held assist Transfers: Sit to/from Stand Sit to Stand: Min assist, +2 safety/equipment                  Balance Overall balance assessment: Needs assistance Sitting-balance support: No upper extremity supported, Feet supported Sitting balance-Leahy Scale: Good     Standing balance support: Bilateral upper extremity supported Standing balance-Leahy Scale: Poor                             ADL either performed or assessed with clinical judgement   ADL Overall ADL's : Needs assistance/impaired Eating/Feeding: Set up;Sitting   Grooming: Set up;Supervision/safety;Sitting   Upper Body Bathing: Minimal assistance;Sitting   Lower Body Bathing: Maximal assistance;+2 for safety/equipment Lower Body Bathing Details (indicate cue type and reason): min A sit<>stand Upper Body Dressing : Minimal assistance;Sitting   Lower Body Dressing: Maximal assistance;+2 for safety/equipment Lower Body Dressing Details (indicate cue type and reason): min A sit<>stand Toilet Transfer: Minimal assistance;+2 for safety/equipment Toilet Transfer Details (indicate cue type and reason): side step up towards [facility] Toileting- Clothing Manipulation and Hygiene: Total assistance;+2 for safety/equipment Toileting - Clothing Manipulation Details (indicate cue type and reason): min A sit<>stand             Vision Baseline Vision/History: 1 Wears glasses (reading) Ability to See in Adequate Light: 0  Adequate Patient Visual Report: No change from baseline              Pertinent Vitals/Pain Pain Assessment Pain Assessment: Faces Faces Pain Scale: Hurts whole  lot Pain Descriptors / Indicators: Aching, Sore, Sharp, Shooting Pain Intervention(s): Limited activity within patient's tolerance, Monitored during session, Repositioned     Hand Dominance Right   Extremity/Trunk Assessment Upper Extremity Assessment Upper Extremity Assessment: Overall WFL for tasks assessed           Communication Communication Communication: No difficulties   Cognition Arousal/Alertness: Awake/alert Behavior During Therapy: WFL for tasks assessed/performed Overall Cognitive Status: Within Functional Limits for tasks assessed                                                  Home Living Family/patient expects to be discharged to:: Private residence Living Arrangements: Spouse/significant other Available Help at Discharge: Family;Available 24 hours/day Type of Home: House Home Access: Stairs to enter [employer] of Steps: 1 (stoop)   Home Layout: One level     Bathroom Shower/Tub: Tub/shower unit;Curtain   Firefighter: Standard     Home Equipment: Agricultural consultant (2 wheels);Rollator (4 wheels);Shower seat          Prior Functioning/Environment Prior Level of Function : Independent/Modified Independent;Driving                        OT Problem List: Decreased strength;Decreased range of motion;Impaired balance (sitting and/or standing);Pain      OT Treatment/Interventions: Self-care/ADL training;DME and/or AE instruction;Patient/family education;Balance training    OT Goals(Current goals can be found in the care plan section) Acute Rehab OT Goals Patient Stated Goal: to live, to get back to doing what I want to/ enjoy OT Goal Formulation: With patient Time For Goal Achievement: 02/04/22 Potential to Achieve Goals: Good  OT Frequency: Min 2X/week    Co-evaluation PT/OT/SLP Co-Evaluation/Treatment: Yes Reason for Co-Treatment: For patient/therapist safety;To address functional/ADL transfers PT  goals addressed during session: Mobility/safety with mobility;Balance;Strengthening/ROM OT goals addressed during session: Strengthening/ROM;ADL's and self-care      AM-PAC OT "6 Clicks" Daily Activity     Outcome Measure Help from another person eating meals?: A Little Help from another person taking care of personal grooming?: A Little Help from another person toileting, which includes using toliet, bedpan, or urinal?: A Lot Help from another person bathing (including washing, rinsing, drying)?: A Lot Help from another person to put on and taking off regular upper body clothing?: A Lot Help from another person to put on and taking off regular lower body clothing?: Total 6 Click Score: 13   End of Session Nurse Communication: Mobility status  Activity Tolerance: Patient tolerated treatment well Patient left: in bed;with call bell/phone within reach;with bed alarm set;with family/visitor present  OT Visit Diagnosis: Unsteadiness on feet (R26.81);Other abnormalities of gait and mobility (R26.89);Muscle weakness (generalized) (M62.81);Pain Pain - part of body:  (ribs)                Time: 1610-9604 OT Time Calculation (min): 42 min Charges:  OT General Charges $OT Visit: 1 Visit OT Evaluation $OT Eval Moderate Complexity: 1 Mod OT Treatments $Self Care/Home Management : 8-22 mins  Ignacia Palma, OTR/L Acute Rehab Services [facility] [phone] Office [phone]    Evette Georges 01/21/2022, 11:26 AM

## 2022-01-21 NOTE — Progress Notes (Signed)
NAME:  Eric [name], MRN:  [id number], DOB:  [birth year], LOS: 4 ADMISSION DATE:  01/17/2022, CONSULTATION DATE:  8/10 REFERRING MD:  Dr. Cristal Deer, CHIEF COMPLAINT:  Cardiac arrest   History of Present Illness:  Patient is encephalopathic and/or intubated. Therefore history has been obtained from chart review.   76 year old male with PMH as below, which is significant for aortic stenosis s/p bioprosthetic replacement in 2019, traumatic SAH, HTN, HLD, hypothyroid, RA on methotrexate, and prostate Ca s/p radioactive seeds. He was in his usual state of health on 8/10. Wife left the house to run errands. He did attempt to call her at some point, when she tired to call back he did not answer. She arrived home to find him altered after a fall. EMS was called and upon their arrival he was awake and found to be in complete heart block. He was externally paced for about 10 mins en route, but subsequently lose pulses. CPR initiated and [facility] airway placed. ACLS ongoing upon arrival to ED. ROSC achieved 1st pulse check in ED. 3rd degree block. Cardiology consulted. Epi drip started for shock. Head CT negative post fall. Patient taken to the cath lab for further evaluation. Transvenous pacemaker placed in the cath lab.   Pertinent  Medical History  ortic stenosis s/p bioprosthetic replacement in 2019, traumatic SAH, HTN, HLD, hypothyroid, RA on methotrexate, and prostate Ca s/p radioactive seeds.  Significant Hospital Events: Including procedures, antibiotic start and stop dates in addition to other pertinent events   8/10 cardiac arrest, CHB, transvenous pacer placed. Approx CPR 8/11 Remains intubated, spiked fever,   Interim History / Subjective:   Off of epi Temp wire still in place. Seen by EP today.  Objective   Blood pressure (!) 142/65, pulse 71, temperature 97.6 F (36.4 C), temperature source Oral, resp. rate (!) 21, height 5\' 9"  (1.753 m), weight 87.1 kg, SpO2 98 %.         Intake/Output Summary (Last 24 hours) at 01/21/2022 0854 Last data filed at 01/21/2022 0500 Gross per 24 hour  Intake 124.21 ml  Output 485 ml  Net -360.79 ml   Filed Weights   01/19/22 0434 01/20/22 0500 01/21/22 0500  Weight: 86.5 kg 85.5 kg 87.1 kg    General: Elderly gentleman resting in bed alert following commands HEENT: Abrasions on face Neuro: Alert oriented following commands moves all 4 extremities CV: Regular rhythm S1-S2 PULM: Diminished breath sounds bilaterally GI: Soft nontender nondistended Extremities: No dependent edema GU: Foley in place  Labs reviewed  CT head: nonacute CT chest: No evidence of PE (non-contrasted study), cardiomegaly, CAD, bilateral ATX, GGO RUL, Evidence of right heart dysfunction with reflux of contrast into the IVC and hepatic veins.  Resolved Hospital Problem list     Assessment & Plan:   Cardiac arrest Complete heart block Cardiogenic shock Acute Hypoxic Respiratory Failure s/p transvenous pacemaker 8/10. Possibly due to beta blocker Plan: Remains in the ICU with transvenous pacer in place. EP attempting to wean off transvenous pacer. Hopefully they can do this without plans for device placement. Shock state resolved. PT OT Up in chair and advance diet as tolerated.  AKI Hyperkalemia Plan: Resolved Follow urine output  Fever, aspiration pneumonia New 8/11, imaging concerning for aspiration PNA Plan: Antibiotics de-escalated to ceftriaxone.  HTN HLD Plan: Holding home Coreg amlodipine and spironolactone  RA Plan: Holding home methotrexate  Hypothyroidism Plan: Continue Synthroid   Best Practice (right click and "Reselect all SmartList Selections" daily)  Diet/type: NPO, if unable to extubate will start TF DVT prophylaxis: prophylactic heparin  GI prophylaxis: PPI Lines: Central line Foley:  Yes, and it is still needed Code Status:  full code Last date of multidisciplinary goals of care discussion    Family updated at bedside 01/21/2022.  Labs   CBC: Recent Labs  Lab 01/17/22 2057 01/17/22 2058 01/18/22 0422 01/18/22 1443 01/19/22 0432 01/20/22 0545  WBC 15.2*  --  15.3* 15.8* 15.0* 8.6  HGB 12.7* 12.9* 11.9* 12.9* 11.1* 10.7*  HCT 37.7* 38.0* 35.4* 38.3* 33.8* 32.2*  MCV 99.7  --  100.3* 98.5 100.9* 100.3*  PLT 266  --  233 205 164 103*    Basic Metabolic Panel: Recent Labs  Lab 01/17/22 2057 01/17/22 2058 01/18/22 0422 01/18/22 1443 01/19/22 0432 01/20/22 0545 01/21/22 0625  NA 135   < > 136 135 136 139 138  K 5.8*   < > 5.3* 4.7 4.2 3.9 3.5  CL 106  --  107 105 107 107 106  CO2 22  --  18* 19* 22 24 22   GLUCOSE 148*  --  201* 174* 161* 99 115*  BUN 24*  --  30* 30* 22 20 21   CREATININE 2.08*  --  2.40* 1.95* 1.56* 1.50* 1.14  CALCIUM 8.7*  --  8.3* 8.7* 8.3* 8.4* 8.4*  MG 2.2  --  2.1  --  2.0  --   --   PHOS  --   --  4.0  --  2.2* 3.4  --    < > = values in this interval not displayed.   GFR: Estimated Creatinine Clearance: 60.3 mL/min (by C-G formula based on SCr of 1.14 mg/dL). Recent Labs  Lab 01/18/22 0422 01/18/22 1443 01/18/22 1813 01/19/22 0432 01/20/22 0545  WBC 15.3* 15.8*  --  15.0* 8.6  LATICACIDVEN  --  3.6* 3.4*  --   --     Liver Function Tests: Recent Labs  Lab 01/17/22 2057 01/18/22 1443 01/20/22 0545  AST 158* 70* 45*  ALT 104* 74* 45*  ALKPHOS 82 65 58  BILITOT 1.2 0.9 1.5*  PROT 6.4* 5.9* 6.0*  ALBUMIN 3.9 3.3* 3.1*   No results for input(s): "LIPASE", "AMYLASE" in the last 168 hours. No results for input(s): "AMMONIA" in the last 168 hours.  ABG    Component Value Date/Time   PHART 7.354 01/17/2022 2058   PCO2ART 41.6 01/17/2022 2058   PO2ART 327 (H) 01/17/2022 2058   HCO3 23.2 01/17/2022 2058   TCO2 24 01/17/2022 2058   ACIDBASEDEF 2.0 01/17/2022 2058   O2SAT 100 01/17/2022 2058     Coagulation Profile: No results for input(s): "INR", "PROTIME" in the last 168 hours.  Cardiac Enzymes: No results for  input(s): "CKTOTAL", "CKMB", "CKMBINDEX", "TROPONINI" in the last 168 hours.  HbA1C: Hgb A1c MFr Bld  Date/Time Value Ref Range Status  01/18/2022 04:22 AM 5.5 4.8 - 5.6 % Final    Comment:    (NOTE) Pre diabetes:          5.7%-6.4%  Diabetes:              >6.4%  Glycemic control for   <7.0% adults with diabetes     CBG: Recent Labs  Lab 01/20/22 1636 01/20/22 2008 01/20/22 2326 01/21/22 0434 01/21/22 0749  GLUCAP 111* 131* 113* 120* 115*    Review of Systems:   Patient is encephalopathic and/or intubated. Therefore history has been obtained from chart review.  Past Medical History:  He,  has no past medical history on file.   Surgical History:  AVR 2019   Social History:      Family History:  His family history is not on file.   Allergies Allergies  Allergen Reactions   Lipitor [Atorvastatin] Other (See Comments)    Myalgia    Penicillins Other (See Comments)    Possibly allergic- family not 100% certain   Zetia [Ezetimibe] Other (See Comments)    Myalgia      Home Medications  Prior to Admission medications   Not on File     This patient is critically ill with multiple organ system failure; which, requires frequent high complexity decision making, assessment, support, evaluation, and titration of therapies. This was completed through the application of advanced monitoring technologies and extensive interpretation of multiple databases. During this encounter critical care time was devoted to patient care services described in this note for 32 minutes.  Josephine Igo, DO [facility] 01/21/2022 8:54 AM

## 2022-01-21 NOTE — Consult Note (Signed)
Cardiology Consultation:   Patient ID: Eric [name] MRN: [id number]; DOB: [birth year]  Admit date: 01/17/2022 Date of Consult: 01/21/2022  PCP:  Wanda Plump, MD   [facility] Providers Cardiologist:  None        Patient Profile:   Eric [name] is a 76 y.o. male with a hx of VHD s/p AVR (bioprosthetic 2019), HTN, HLD, RA (on methotrexate), prostate Ca (s/p seed implant), hypothyroidism, traumatic SAH who is being seen 01/21/2022 for the evaluation of CHB at the request of Dr. Shari Prows.  History of Present Illness:   Mr. Poteat last saw Dr. Dulce Sellar in Dec, was doing well, discussed hx of a fall resulting in [facility] back in 2020 and found with COVID illness at that time. Problem list reports a hx of an SVT a week or so after his AVR that required DCCV, though outside if this not specifically mentioned as a known problem for him.   He was found by family day of admission 01/17/22 altered, had suffered a fall, found in CHB by EMS and transcutaneously paced in route (by record) though lost pulse and started with CPR/airway In the ER + pulse and epi gtt started, CT done of head was negative and emergently brought to the cath lab for TV temp pacer implant Admitted to ICU, home coreg stopped  Subsequently has had some fever placed on antibiotics to cover aspiration  Extubated 01/19/22 AAOx4  OFF Epi gtt yesterday morning  Yesterday by cardiology noted with intermittent 1:1 conduction and temp pacing torned to 40bpm back up and elicited hx of since his AVR not particularly feeling well with a couple weak spells, perhaps syncope as well and suspected perhaps intermittent CHB since his AVR.  TTE this admission with preserved LVEF 60-65%, well functioning AVR  EMS reviewed: Initially with pulse, HR 30's. [T was awake reported that he fainted in the hall and then was able to crawl to the bedroom, where they found him.  Pt reported L SIDED WEAKNESS >> sat up to assess further became dizzy,  rates 20's and transcutaneous pacing started. In route altered mentation and depressed respirations and provided BVM > pulseless > intubated and CPR/LUCAS Here again with pulse Tracings with CHB, rare V beat rates towards 20 or less  LABS K+ 3.5 BUN/Creat 21/1.14  Yesterday AST 45, ALT 45 (down from 158/104) WBC 8.6 (down from 15) H/H 10.7/32.2  Lactic acid 3.6 > 3.4   HS Trop 507, 479 TSH 3.615  No fever 24hours  This am the patient is AAOx4, daughter at bedside.  He is sore in his chest/shoulders, but otherwise says he is doing "pretty good for what has happened"  Home Medications:  Prior to Admission medications   Medication Sig Start Date End Date Taking? Authorizing Provider  acetaminophen (TYLENOL) 500 MG tablet Take 500 mg by mouth every 6 (six) hours as needed for mild pain or headache.   Yes [provider]  amLODipine (NORVASC) 5 MG tablet Take 5 mg by mouth daily. 11/23/21  Yes [provider]  aspirin EC 81 MG tablet Take 81 mg by mouth daily. Swallow whole.   Yes [provider]  carvedilol (COREG) 12.5 MG tablet Take 12.5 mg by mouth 2 (two) times daily with a meal. 11/12/21  Yes [provider]  Cholecalciferol (VITAMIN D3) 50 MCG (2000 UT) TABS Take 2,000 Units by mouth daily.   Yes [provider]  folic acid (FOLVITE) 1 MG tablet Take 1 mg by mouth daily.  11/19/21  Yes [provider]  levothyroxine (SYNTHROID) 88 MCG tablet Take 88 mcg by mouth daily before breakfast. 10/28/21  Yes [provider]  methotrexate (RHEUMATREX) 2.5 MG tablet Take 10 mg by mouth every Friday. 11/19/21  Yes [provider]  Multiple Vitamin (MULTIVITAMIN) TABS Take 1 tablet by mouth daily with breakfast.   Yes [provider]  rosuvastatin (CRESTOR) 10 MG tablet Take 10 mg by mouth at bedtime. 01/09/22  Yes [provider]  spironolactone (ALDACTONE) 25 MG tablet Take 25 mg by mouth daily. 11/09/21  Yes  [provider]    Inpatient Medications: Scheduled Meds:  Chlorhexidine Gluconate Cloth  6 each Topical Daily   docusate sodium  100 mg Oral BID   heparin  5,000 Units Subcutaneous Q8H   insulin aspart  0-15 Units Subcutaneous Q4H   levothyroxine  88 mcg Oral Q0600   midazolam  2 mg Intravenous Once   polyethylene glycol  17 g Oral Daily   sodium chloride flush  3 mL Intravenous Q12H   Continuous Infusions:  sodium chloride     cefTRIAXone (ROCEPHIN)  IV Stopped (01/20/22 1232)   epinephrine Stopped (01/20/22 1000)   PRN Meds: sodium chloride, acetaminophen, docusate sodium, fentaNYL (SUBLIMAZE) injection, guaiFENesin, ondansetron (ZOFRAN) IV, mouth rinse, oxyCODONE, sodium chloride flush  Allergies:    Allergies  Allergen Reactions   Lipitor [Atorvastatin] Other (See Comments)    Myalgia    Penicillins Other (See Comments)    Possibly allergic- family not 100% certain   Zetia [Ezetimibe] Other (See Comments)    Myalgia     Social History:   Social History   Socioeconomic History   Marital status: Married    Spouse name: Not on file   Number of children: Not on file   Years of education: Not on file   Highest education level: Not on file  Occupational History   Not on file  Tobacco Use   Smoking status: Not on file   Smokeless tobacco: Not on file  Substance and Sexual Activity   Alcohol use: Not on file   Drug use: Not on file   Sexual activity: Not on file  Other Topics Concern   Not on file  Social History Narrative   Not on file   Social Determinants of Health   Financial Resource Strain: Not on file  Food Insecurity: Not on file  Transportation Needs: Not on file  Physical Activity: Not on file  Stress: Not on file  Social Connections: Not on file  Intimate Partner Violence: Not on file    Family History:   See merged chart history ROS:  Please see the history of present illness.  All other ROS reviewed and negative.     Physical  Exam/Data:   Vitals:   01/21/22 0435 01/21/22 0500 01/21/22 0600 01/21/22 0700  BP:  124/69 132/73 129/67  Pulse:  65 71 71  Resp:  18 (!) 27 20  Temp: 98.5 F (36.9 C)     TempSrc: Oral     SpO2:  98% 98% 98%  Weight:  87.1 kg    Height:        Intake/Output Summary (Last 24 hours) at 01/21/2022 0726 Last data filed at 01/21/2022 0500 Gross per 24 hour  Intake 157.27 ml  Output 610 ml  Net -452.73 ml      01/21/2022    5:00 AM 01/20/2022    5:00 AM 01/19/2022    4:34 AM  Last  3 Weights  Weight (lbs) 192 lb 0.3 oz 188 lb 7.9 oz 190 lb 11.2 oz  Weight (kg) 87.1 kg 85.5 kg 86.5 kg     Body mass index is 28.36 kg/m.  General:  Well nourished, well developed, in no acute distress, appears chronically ill, small bruises face HEENT: normal Neck: no JVD Vascular: No carotid bruits Cardiac:  RRR; no murmurs, gallops or rubs Lungs:  clear to auscultation bilaterally, no wheezing, rhonchi or rales  Abd: soft, nontender, no hepatomegaly  Ext: no edema Musculoskeletal:  No deformities Skin: warm and dry  Neuro: no focal abnormalities noted Psych:  Normal affect   EKG:  The EKG was personally reviewed and demonstrates:    EMS: CHB RBBB, uncertain rate about 20 OLD only available 05/15/2021 SR 67bpm, LBBB  Telemetry:  Telemetry was personally reviewed and demonstrates:   Currently is SR 1:1 70's Has not used back pacing since rate was reduced yesterday morning  Relevant CV Studies:  TTE 01/18/22: IMPRESSIONS   1. Left ventricular ejection fraction, by estimation, is 60 to 65%. The  left ventricle has normal function. The left ventricle has no regional  wall motion abnormalities. There is mild concentric left ventricular  hypertrophy. Left ventricular diastolic  function could not be evaluated.   2. Right ventricular systolic function is normal. The right ventricular  size is normal. Tricuspid regurgitation signal is inadequate for assessing  PA pressure.   3. The  mitral valve is normal in structure. No evidence of mitral valve  regurgitation. No evidence of mitral stenosis.   4. The aortic valve was not well visualized. There is a 23 mm Edwards  Intuity valve present in the aortic position.      Aortic valve regurgitation is not visualized. No evidence of aortic  valve stenosis. Aortic valve area, by VTI measures 2.33 cm. Aortic valve  mean gradient measures 2.0 mmHg. Aortic valve Vmax measures 0.91 m/s.   5. The inferior vena cava is dilated in size with >50% respiratory  variability, suggesting right atrial pressure of 8 mmHg.      Echo 04/28/2019 1. Left ventricular ejection fraction, by visual estimation, is 60 to  65%. The left ventricle has normal function. There is no left ventricular  hypertrophy.   2. Left ventricular diastolic parameters are consistent with Grade I  diastolic dysfunction (impaired relaxation).   3. Endocardium not imaged well. EF appears normal but cannot completely  exclude mild apical hypokinesis. Can consider repeat echo with Definity  contrast if clinically indicated.   4. Global right ventricle has normal systolic function.The right  ventricular size is normal. No increase in right ventricular wall  thickness.   5. Left atrial size was normal.   6. Right atrial size was normal.   7. Mild mitral annular calcification.   8. The mitral valve is normal in structure. No evidence of mitral valve  regurgitation.   9. The tricuspid valve is normal in structure. Tricuspid valve  regurgitation is trivial.  10. The pulmonic valve was grossly normal. Pulmonic valve regurgitation is  not visualized.  11. Normal pulmonary artery systolic pressure.  12. The aortic valve is tricuspid. Aortic valve regurgitation is not  visualized. Mild aortic valve sclerosis without stenosis.    Monitor 2020 An extended ZIO monitor was performed for 13 days and 17 hours beginning 02/16/2019 to evaluate near syncope. Baseline rhythm is  sinus bundle branch block is seen minimum average and maximum heart rates are 44 ,65 and 108  bpm. There were no pauses of 3 seconds or greater and no episodes of high degree AV block or sinus node exit block. Ventricular ectopy was rare with isolated PVCs Supraventricular ectopy was rare with APCs there were 2 brief runs of atrial tachycardia the longest 13 complexes at 128 bpm.  There were no episodes of atrial fibrillation or flutter. There were 5 triggered and 3 diary events.  1 of the triggered events showed infrequent PVCs all the other triggered and diary events showed sinus rhythm and no arrhythmia. Conclusion rare ventricular and supraventricular ectopy without bradycardia.  Triggered and symptomatic events are predominantly unassociated with arrhythmia.  2 brief runs of atrial tachycardia asymptomatic.   10/16/2017: LHC 1. Known severe aortic stenosis by noninvasive assessment 2. Widely patent coronary arteries with minor luminal irregularity (right dominant) 3. Normal right-sided intracardiac pressures Plan: CTA studies, cardiac surgical evaluation for aortic valve replacement    Laboratory Data:  High Sensitivity Troponin:   Recent Labs  Lab 01/17/22 1755 01/17/22 2057 01/18/22 0422  TROPONINIHS 35* 507* 479*     Chemistry Recent Labs  Lab 01/17/22 2057 01/17/22 2058 01/18/22 0422 01/18/22 1443 01/19/22 0432 01/20/22 0545  NA 135   < > 136 135 136 139  K 5.8*   < > 5.3* 4.7 4.2 3.9  CL 106  --  107 105 107 107  CO2 22  --  18* 19* 22 24  GLUCOSE 148*  --  201* 174* 161* 99  BUN 24*  --  30* 30* 22 20  CREATININE 2.08*  --  2.40* 1.95* 1.56* 1.50*  CALCIUM 8.7*  --  8.3* 8.7* 8.3* 8.4*  MG 2.2  --  2.1  --  2.0  --   GFRNONAA 33*  --  27* 35* 46* 48*  ANIONGAP 7  --  11 11 7 8    < > = values in this interval not displayed.    Recent Labs  Lab 01/17/22 2057 01/18/22 1443 01/20/22 0545  PROT 6.4* 5.9* 6.0*  ALBUMIN 3.9 3.3* 3.1*  AST 158* 70* 45*  ALT  104* 74* 45*  ALKPHOS 82 65 58  BILITOT 1.2 0.9 1.5*   Lipids  Recent Labs  Lab 01/18/22 0422  TRIG 180*    Hematology Recent Labs  Lab 01/18/22 1443 01/19/22 0432 01/20/22 0545  WBC 15.8* 15.0* 8.6  RBC 3.89* 3.35* 3.21*  HGB 12.9* 11.1* 10.7*  HCT 38.3* 33.8* 32.2*  MCV 98.5 100.9* 100.3*  MCH 33.2 33.1 33.3  MCHC 33.7 32.8 33.2  RDW 13.3 13.5 13.5  PLT 205 164 103*   Thyroid  Recent Labs  Lab 01/17/22 2057  TSH 3.615  FREET4 1.44*    BNPNo results for input(s): "BNP", "PROBNP" in the last 168 hours.  DDimer No results for input(s): "DDIMER" in the last 168 hours.   Radiology/Studies:    Assessment and Plan:   CHB He has regained conduction at his baseline LBBB off his coreg Would continue to follow HR with temp wire in place today Re-evaluate tomorrow AM  Threshold on temp wire today is 0.4  VHD Functioning well  Cardiac arrest/brady Suspect/probable aspiration PNA Shock liver AKI Numbers improving Off Zosyn remains on Rocephin No fever 24 hours    Dr. Lalla Brothers has seen the patient He has regained conduction Will get EKG today Known LBBB (since his AVR) Keep off all potential nodal blocking agents If no recurrent bradycardia will plan to pull temp wire tomorrow Pt and daughter aware  and agreeable    Risk Assessment/Risk Scores:   For questions or updates, please contact [facility] Please consult [email or web address] for contact info under    Signed, Sheilah Pigeon, PA-C  01/21/2022 7:26 AM

## 2022-01-22 DIAGNOSIS — I469 Cardiac arrest, cause unspecified: Secondary | ICD-10-CM | POA: Diagnosis not present

## 2022-01-22 DIAGNOSIS — I442 Atrioventricular block, complete: Secondary | ICD-10-CM | POA: Diagnosis not present

## 2022-01-22 LAB — SURGICAL PCR SCREEN
MRSA, PCR: NEGATIVE
Staphylococcus aureus: NEGATIVE

## 2022-01-22 LAB — GLUCOSE, CAPILLARY
Glucose-Capillary: 82 mg/dL (ref 70–99)
Glucose-Capillary: 97 mg/dL (ref 70–99)
Glucose-Capillary: 144 mg/dL — ABNORMAL HIGH (ref 70–99)
Glucose-Capillary: 107 mg/dL — ABNORMAL HIGH (ref 70–99)
Glucose-Capillary: 111 mg/dL — ABNORMAL HIGH (ref 70–99)
Glucose-Capillary: 115 mg/dL — ABNORMAL HIGH (ref 70–99)
Glucose-Capillary: 97 mg/dL (ref 70–99)

## 2022-01-22 LAB — BASIC METABOLIC PANEL
Anion gap: 5 (ref 5–15)
BUN: 18 mg/dL (ref 8–23)
CO2: 26 mmol/L (ref 22–32)
Calcium: 8.2 mg/dL — ABNORMAL LOW (ref 8.9–10.3)
Chloride: 106 mmol/L (ref 98–111)
Creatinine, Ser: 1.05 mg/dL (ref 0.61–1.24)
GFR, Estimated: >60 mL/min (ref >60)
Glucose, Bld: 87 mg/dL (ref 70–99)
Potassium: 3.7 mmol/L (ref 3.5–5.1)
Sodium: 137 mmol/L (ref 135–145)

## 2022-01-22 NOTE — TOC Initial Note (Signed)
Transition of Care [facility]) - Initial/Assessment Note    Patient Details  Name: Eric [name] MRN: [id number] Date of Birth: [birth year]  Transition of Care [facility]) CM/SW Contact:    Gala Lewandowsky, RN Phone Number: 01/22/2022, 4:13 PM  Clinical Narrative: Patient presented post  fall- AMS. Patient found to be in CHB. Patient intubated and extubated 01-19-22. PTA patient was from home with spouse. Patient has support of son Eric [name] and he can be reached at [phone]. Eric [name] states he is 1.5 hours from the patient; however, patient has two daughters close by in [place] and [place]. Family states they will be alternating care between the children so someone will always be in the home. Patient has DME RW, WC and son to check on the shower chair. Patient will need a bedside commode ordered prior to discharge home. Case Manager discussed home health services with the patient and family. All are agreeable to home health PT/OT/Aide services- family asked for [facility] and they are not able to service due to staffing. Case Manager has called [facility] and is awaiting confirmation from the agency. Case Manager will continue to follow for additional transition of care needs as the patient progresses.        Expected Discharge Plan: Home w Home Health Services Barriers to Discharge: Continued Medical Work up   Patient Goals and CMS Choice Patient states their goals for this hospitalization and ongoing recovery are:: to return home   Choice offered to / list presented to : Spouse, Adult Children  Expected Discharge Plan and Services Expected Discharge Plan: Home w Home Health Services In-house Referral: NA Discharge Planning Services: CM Consult Post Acute Care Choice: Home Health Living arrangements for the past 2 months: Single Family Home                    HH Arranged: PT, OT, Nurse's Aide HH Agency: [facility] Date [facility] Agency Contacted: 01/22/22 Time  HH Agency Contacted: 1611 Representative spoke with at [facility] Agency: Kandee Keen  Prior Living Arrangements/Services Living arrangements for the past 2 months: Single Family Home Lives with:: Spouse Patient language and need for interpreter reviewed:: Yes Do you feel safe going back to the place where you live?: Yes      Need for Family Participation in Patient Care: Yes (Comment) Care giver support system in place?: Yes (comment) Current home services: DME (Patient has RW, WC and son is checking on shower chair.) Criminal Activity/Legal Involvement Pertinent to Current Situation/Hospitalization: No - Comment as needed   Permission Sought/Granted Permission sought to share information with : Case Manager, Magazine features editor, Family Supports Permission granted to share information with : Yes, Verbal Permission Granted  Share Information with NAME: Eric [name], Barski [phone]  Permission granted to share info w AGENCY: [facility]        Emotional Assessment Appearance:: Appears stated age Attitude/Demeanor/Rapport: Engaged Affect (typically observed): Appropriate Orientation: : Oriented to Self, Oriented to Place Alcohol / Substance Use: Not Applicable Psych Involvement: No (comment)  Admission diagnosis:  Complete heart block (HCC) [I44.2] Cardiac arrest [facility]) [I46.9] Patient Active Problem List   Diagnosis Date Noted   Complete heart block (HCC) 01/17/2022   Heart block AV complete (HCC)    Cardiac arrest (HCC)    Cardiogenic shock (HCC)    PCP:  Wanda Plump, MD Pharmacy:   [facility] [address], [place] - [address] [place] [address] Phone: [phone] Fax: [phone]  Readmission  Risk Interventions     No data to display

## 2022-01-22 NOTE — Progress Notes (Signed)
NAME:  Eric [name], MRN:  [id number], DOB:  09/16/1945, LOS: 5 ADMISSION DATE:  01/17/2022, CONSULTATION DATE:  8/10 REFERRING MD:  Dr. Cristal Deer, CHIEF COMPLAINT:  Cardiac arrest   History of Present Illness:  Patient is encephalopathic and/or intubated. Therefore history has been obtained from chart review.   76 year old male with PMH as below, which is significant for aortic stenosis s/p bioprosthetic replacement in 2019, traumatic SAH, HTN, HLD, hypothyroid, RA on methotrexate, and prostate Ca s/p radioactive seeds. He was in his usual state of health on 8/10. Wife left the house to run errands. He did attempt to call her at some point, when she tired to call back he did not answer. She arrived home to find him altered after a fall. EMS was called and upon their arrival he was awake and found to be in complete heart block. He was externally paced for about 10 mins en route, but subsequently lose pulses. CPR initiated and [facility] airway placed. ACLS ongoing upon arrival to ED. ROSC achieved 1st pulse check in ED. 3rd degree block. Cardiology consulted. Epi drip started for shock. Head CT negative post fall. Patient taken to the cath lab for further evaluation. Transvenous pacemaker placed in the cath lab.   Pertinent  Medical History  ortic stenosis s/p bioprosthetic replacement in 2019, traumatic SAH, HTN, HLD, hypothyroid, RA on methotrexate, and prostate Ca s/p radioactive seeds.  Significant Hospital Events: Including procedures, antibiotic start and stop dates in addition to other pertinent events   8/10 cardiac arrest, CHB, transvenous pacer placed. Approx CPR 8/11 Remains intubated, spiked fever,  Extubated 8/13 8/15 negative rate 75-80, no longer paced (backup 40)  Interim History / Subjective:   Feeling better. Currently transvenous pacer has a backup rate 40, not paced, native rate 75-80 Tolerating p.o. Up to chair x2 yesterday  Objective   Blood pressure 127/71,  pulse 79, temperature 97.7 F (36.5 C), temperature source Oral, resp. rate (!) 25, height 5\' 9"  (1.753 m), weight 87.4 kg, SpO2 96 %.        Intake/Output Summary (Last 24 hours) at 01/22/2022 0754 Last data filed at 01/22/2022 0600 Gross per 24 hour  Intake 595 ml  Output 600 ml  Net -5 ml   Filed Weights   01/20/22 0500 01/21/22 0500 01/22/22 0558  Weight: 85.5 kg 87.1 kg 87.4 kg    General: Elderly gentleman, comfortable, awake HEENT: Scattered abrasions on face and bridge of nose Neuro: Awake, alert, interacting appropriately, moves all extremities with good strength CV: Regular, 75-80, not paced currently but wire in place, prolonged QRS on telemetry PULM: Clear bilaterally, no wheezes or crackles GI: Nondistended, positive bowel sounds Extremities: Trace pretibial edema Skin: No rash GU: Foley in place  Resolved Hospital Problem list     Assessment & Plan:   Cardiac arrest Complete heart block Cardiogenic shock Acute Hypoxic Respiratory Failure s/p transvenous pacemaker 8/10. Possibly due to beta blocker Plan: -Transvenous pacer in place, no longer paced.  May be able to remove 8/15.  Will discuss with cardiology. -Per patient and son at bedside he has had symptomatic bradycardia, arrest, in the past.  He may require permanent pacemaker -Push pulmonary hygiene -SLP evaluation > regular diet, thin liquids -PT/OT/out of bed to chair  AKI Hyperkalemia Plan: -Follow urine output, BMP -Avoid nephrotoxins -Renal dose medications  Fever, aspiration pneumonia New 8/11, imaging concerning for aspiration PNA Plan: -Zosyn changed to ceftriaxone, stop date in place  HTN HLD Plan: -Home  carvedilol, amlodipine, spinal lactone are all on hold.  Restart as blood pressure and heart rate will allow  RA Plan: -Hold methotrexate in setting of active infection  Hypothyroidism Plan: -Levothyroxine   Best Practice (right click and "Reselect all SmartList  Selections" daily)   Diet/type: Regular consistency (see orders),  DVT prophylaxis: prophylactic heparin  GI prophylaxis: PPI Lines: Central line Foley:  Yes, and it is still needed Code Status:  full code Last date of multidisciplinary goals of care discussion   Family updated at bedside 8/15  Labs   CBC: Recent Labs  Lab 01/17/22 2057 01/17/22 2058 01/18/22 0422 01/18/22 1443 01/19/22 0432 01/20/22 0545  WBC 15.2*  --  15.3* 15.8* 15.0* 8.6  HGB 12.7* 12.9* 11.9* 12.9* 11.1* 10.7*  HCT 37.7* 38.0* 35.4* 38.3* 33.8* 32.2*  MCV 99.7  --  100.3* 98.5 100.9* 100.3*  PLT 266  --  233 205 164 103*    Basic Metabolic Panel: Recent Labs  Lab 01/17/22 2057 01/17/22 2058 01/18/22 0422 01/18/22 1443 01/19/22 0432 01/20/22 0545 01/21/22 0625 01/22/22 0430  NA 135   < > 136 135 136 139 138 137  K 5.8*   < > 5.3* 4.7 4.2 3.9 3.5 3.7  CL 106  --  107 105 107 107 106 106  CO2 22  --  18* 19* 22 24 22 26   GLUCOSE 148*  --  201* 174* 161* 99 115* 87  BUN 24*  --  30* 30* 22 20 21 18   CREATININE 2.08*  --  2.40* 1.95* 1.56* 1.50* 1.14 1.05  CALCIUM 8.7*  --  8.3* 8.7* 8.3* 8.4* 8.4* 8.2*  MG 2.2  --  2.1  --  2.0  --   --   --   PHOS  --   --  4.0  --  2.2* 3.4  --   --    < > = values in this interval not displayed.   GFR: Estimated Creatinine Clearance: 65.5 mL/min (by C-G formula based on SCr of 1.05 mg/dL). Recent Labs  Lab 01/18/22 0422 01/18/22 1443 01/18/22 1813 01/19/22 0432 01/20/22 0545  WBC 15.3* 15.8*  --  15.0* 8.6  LATICACIDVEN  --  3.6* 3.4*  --   --     Liver Function Tests: Recent Labs  Lab 01/17/22 2057 01/18/22 1443 01/20/22 0545  AST 158* 70* 45*  ALT 104* 74* 45*  ALKPHOS 82 65 58  BILITOT 1.2 0.9 1.5*  PROT 6.4* 5.9* 6.0*  ALBUMIN 3.9 3.3* 3.1*   No results for input(s): "LIPASE", "AMYLASE" in the last 168 hours. No results for input(s): "AMMONIA" in the last 168 hours.  ABG    Component Value Date/Time   PHART 7.354 01/17/2022  2058   PCO2ART 41.6 01/17/2022 2058   PO2ART 327 (H) 01/17/2022 2058   HCO3 23.2 01/17/2022 2058   TCO2 24 01/17/2022 2058   ACIDBASEDEF 2.0 01/17/2022 2058   O2SAT 100 01/17/2022 2058     Coagulation Profile: No results for input(s): "INR", "PROTIME" in the last 168 hours.  Cardiac Enzymes: No results for input(s): "CKTOTAL", "CKMB", "CKMBINDEX", "TROPONINI" in the last 168 hours.  This patient is critically ill with multiple organ system failure; which, requires frequent high complexity decision making, assessment, support, evaluation, and titration of therapies. This was completed through the application of advanced monitoring technologies and extensive interpretation of multiple databases. During this encounter critical care time was devoted to patient care services described in this note for 19  minutes.  Levy Pupa, MD, PhD 01/22/2022, 7:54 AM [facility] [phone] or if no answer before 7:00PM call [phone] For any issues after 7:00PM please call eLink [phone]

## 2022-01-22 NOTE — Progress Notes (Signed)
Progress Note  Patient Name: Eric [name] Date of Encounter: 01/22/2022  [facility] Cardiologist: Dr. Dulce Sellar  Subjective   Chest remains sore, otherwise doing OK  Inpatient Medications    Scheduled Meds:  Chlorhexidine Gluconate Cloth  6 each Topical Daily   docusate sodium  100 mg Oral BID   heparin  5,000 Units Subcutaneous Q8H   insulin aspart  0-15 Units Subcutaneous Q4H   levothyroxine  88 mcg Oral Q0600   midazolam  2 mg Intravenous Once   polyethylene glycol  17 g Oral Daily   sodium chloride flush  3 mL Intravenous Q12H   Continuous Infusions:  sodium chloride     cefTRIAXone (ROCEPHIN)  IV Stopped (01/21/22 1211)   epinephrine Stopped (01/20/22 1000)   PRN Meds: sodium chloride, acetaminophen, docusate sodium, fentaNYL (SUBLIMAZE) injection, guaiFENesin, ondansetron (ZOFRAN) IV, mouth rinse, oxyCODONE, sodium chloride flush   Vital Signs    Vitals:   01/22/22 0600 01/22/22 0645 01/22/22 0700 01/22/22 0800  BP: 134/71  127/71 101/62  Pulse: 71 69 79 77  Resp: (!) 27 (!) 48 (!) 25 (!) 23  Temp:  97.7 F (36.5 C)    TempSrc:  Oral    SpO2: 95% 97% 96% 92%  Weight:      Height:        Intake/Output Summary (Last 24 hours) at 01/22/2022 1048 Last data filed at 01/22/2022 0800 Gross per 24 hour  Intake 545 ml  Output 800 ml  Net -255 ml      01/22/2022    5:58 AM 01/21/2022    5:00 AM 01/20/2022    5:00 AM  Last 3 Weights  Weight (lbs) 192 lb 10.9 oz 192 lb 0.3 oz 188 lb 7.9 oz  Weight (kg) 87.4 kg 87.1 kg 85.5 kg      Telemetry    SR 70's-80's - Personally Reviewed  ECG    Yesterday SR, 71, LBBB - Personally Reviewed  Physical Exam   GEN: No acute distress, appears chronically ill, healing abrasions L face Neck: No JVD, L IJ line/temp wire site, dressing is CDI Cardiac: RRR, no murmurs, rubs, or gallops.  Respiratory: CTA b/l. GI: Soft, nontender, non-distended  MS: No edema; No deformity. Neuro:  Nonfocal  Psych: Normal affect    Labs    High Sensitivity Troponin:   Recent Labs  Lab 01/17/22 1755 01/17/22 2057 01/18/22 0422  TROPONINIHS 35* 507* 479*     Chemistry Recent Labs  Lab 01/17/22 2057 01/17/22 2058 01/18/22 0422 01/18/22 1443 01/19/22 0432 01/20/22 0545 01/21/22 0625 01/22/22 0430  NA 135   < > 136 135 136 139 138 137  K 5.8*   < > 5.3* 4.7 4.2 3.9 3.5 3.7  CL 106  --  107 105 107 107 106 106  CO2 22  --  18* 19* 22 24 22 26   GLUCOSE 148*  --  201* 174* 161* 99 115* 87  BUN 24*  --  30* 30* 22 20 21 18   CREATININE 2.08*  --  2.40* 1.95* 1.56* 1.50* 1.14 1.05  CALCIUM 8.7*  --  8.3* 8.7* 8.3* 8.4* 8.4* 8.2*  MG 2.2  --  2.1  --  2.0  --   --   --   PROT 6.4*  --   --  5.9*  --  6.0*  --   --   ALBUMIN 3.9  --   --  3.3*  --  3.1*  --   --  AST 158*  --   --  70*  --  45*  --   --   ALT 104*  --   --  74*  --  45*  --   --   ALKPHOS 82  --   --  65  --  58  --   --   BILITOT 1.2  --   --  0.9  --  1.5*  --   --   GFRNONAA 33*  --  27* 35* 46* 48* >60 >60  ANIONGAP 7  --  11 11 7 8 10 5    < > = values in this interval not displayed.    Lipids  Recent Labs  Lab 01/21/22 0625  TRIG 157*    Hematology Recent Labs  Lab 01/18/22 1443 01/19/22 0432 01/20/22 0545  WBC 15.8* 15.0* 8.6  RBC 3.89* 3.35* 3.21*  HGB 12.9* 11.1* 10.7*  HCT 38.3* 33.8* 32.2*  MCV 98.5 100.9* 100.3*  MCH 33.2 33.1 33.3  MCHC 33.7 32.8 33.2  RDW 13.3 13.5 13.5  PLT 205 164 103*   Thyroid  Recent Labs  Lab 01/17/22 2057  TSH 3.615  FREET4 1.44*    BNPNo results for input(s): "BNP", "PROBNP" in the last 168 hours.  DDimer No results for input(s): "DDIMER" in the last 168 hours.   Radiology    No results found.  Cardiac Studies   TTE 01/18/22: IMPRESSIONS   1. Left ventricular ejection fraction, by estimation, is 60 to 65%. The  left ventricle has normal function. The left ventricle has no regional  wall motion abnormalities. There is mild concentric left ventricular  hypertrophy.  Left ventricular diastolic  function could not be evaluated.   2. Right ventricular systolic function is normal. The right ventricular  size is normal. Tricuspid regurgitation signal is inadequate for assessing  PA pressure.   3. The mitral valve is normal in structure. No evidence of mitral valve  regurgitation. No evidence of mitral stenosis.   4. The aortic valve was not well visualized. There is a 23 mm Edwards  Intuity valve present in the aortic position.      Aortic valve regurgitation is not visualized. No evidence of aortic  valve stenosis. Aortic valve area, by VTI measures 2.33 cm. Aortic valve  mean gradient measures 2.0 mmHg. Aortic valve Vmax measures 0.91 m/s.   5. The inferior vena cava is dilated in size with >50% respiratory  variability, suggesting right atrial pressure of 8 mmHg.      Echo 04/28/2019 1. Left ventricular ejection fraction, by visual estimation, is 60 to  65%. The left ventricle has normal function. There is no left ventricular  hypertrophy.   2. Left ventricular diastolic parameters are consistent with Grade I  diastolic dysfunction (impaired relaxation).   3. Endocardium not imaged well. EF appears normal but cannot completely  exclude mild apical hypokinesis. Can consider repeat echo with Definity  contrast if clinically indicated.   4. Global right ventricle has normal systolic function.The right  ventricular size is normal. No increase in right ventricular wall  thickness.   5. Left atrial size was normal.   6. Right atrial size was normal.   7. Mild mitral annular calcification.   8. The mitral valve is normal in structure. No evidence of mitral valve  regurgitation.   9. The tricuspid valve is normal in structure. Tricuspid valve  regurgitation is trivial.  10. The pulmonic valve was grossly normal. Pulmonic valve regurgitation is  not visualized.  11. Normal pulmonary artery systolic pressure.  12. The aortic valve is tricuspid. Aortic  valve regurgitation is not  visualized. Mild aortic valve sclerosis without stenosis.    Monitor 2020 An extended ZIO monitor was performed for 13 days and 17 hours beginning 02/16/2019 to evaluate near syncope. Baseline rhythm is sinus bundle branch block is seen minimum average and maximum heart rates are 44 ,65 and 108 bpm. There were no pauses of 3 seconds or greater and no episodes of high degree AV block or sinus node exit block. Ventricular ectopy was rare with isolated PVCs Supraventricular ectopy was rare with APCs there were 2 brief runs of atrial tachycardia the longest 13 complexes at 128 bpm.  There were no episodes of atrial fibrillation or flutter. There were 5 triggered and 3 diary events.  1 of the triggered events showed infrequent PVCs all the other triggered and diary events showed sinus rhythm and no arrhythmia. Conclusion rare ventricular and supraventricular ectopy without bradycardia.  Triggered and symptomatic events are predominantly unassociated with arrhythmia.  2 brief runs of atrial tachycardia asymptomatic.    10/16/2017: LHC 1. Known severe aortic stenosis by noninvasive assessment 2. Widely patent coronary arteries with minor luminal irregularity (right dominant) 3. Normal right-sided intracardiac pressures Plan: CTA studies, cardiac surgical evaluation for aortic valve replacement  Patient Profile     76 y.o. male with a hx of VHD s/p AVR (bioprosthetic 2019), HTN, HLD, RA (on methotrexate), prostate Ca (s/p seed implant), hypothyroidism, traumatic SAH admitted with syncope/fall and marked bradycardia/CHB   He was found by family day of admission 01/17/22 altered, had suffered a fall, found in CHB by EMS and transcutaneously paced in route (by record) though lost pulse and started with CPR/airway In the ER + pulse and epi gtt started, CT done of head was negative and emergently brought to the cath lab for TV temp pacer implant Admitted to ICU, home coreg  stopped   Subsequently has had some fever placed on antibiotics to cover aspiration   Extubated 01/19/22 AAOx4   OFF Epi gtt 01/20/22 morning Temp pacing turned to 40bpm back up on 01/20/22 with no further pacing noted  Assessment & Plan    CHB He has regained conduction at his baseline LBBB off his coreg Would continue to follow HR with temp wire in place today Re-evaluate tomorrow AM   Threshold on temp wire today is again 0.4  In further review of his record His escape morphology was RBBB with baseline LBBB EMS rates with long period of V pause with the CHB With TAVR and baseline conduction disease, may benefit from PPM  Discussed with the patient and his son at bedside.  They are VERY MUCH on board with PPM implant, given number of falls/faints over the last couple years.  Await Dr. Lalla Brothers but will tentatively get him on tomorrow's schedule   VHD Functioning well    CCM on board Cardiac arrest/brady Suspect/probable aspiration PNA, efebrile last 24hrs Last wbc was back to wnl Shock liver, trending down last check AKI is resolved Extubated on RA  For questions or updates, please contact [facility] Please consult [email or web address] for contact info under        Signed, Sheilah Pigeon, PA-C  01/22/2022, 10:48 AM

## 2022-01-23 ENCOUNTER — Inpatient Hospital Stay (HOSPITAL_COMMUNITY)
Admission: EM | Disposition: A | Discharge status: Home Health | Payer: Self-pay | Source: Home / Self Care | Attending: Pulmonary Disease

## 2022-01-23 DIAGNOSIS — I442 Atrioventricular block, complete: Secondary | ICD-10-CM | POA: Diagnosis not present

## 2022-01-23 HISTORY — PX: PACEMAKER IMPLANT: EP1218

## 2022-01-23 LAB — BASIC METABOLIC PANEL
Anion gap: 8 (ref 5–15)
BUN: 15 mg/dL (ref 8–23)
CO2: 25 mmol/L (ref 22–32)
Calcium: 8.5 mg/dL — ABNORMAL LOW (ref 8.9–10.3)
Chloride: 103 mmol/L (ref 98–111)
Creatinine, Ser: 1.01 mg/dL (ref 0.61–1.24)
GFR, Estimated: >60 mL/min (ref >60)
Glucose, Bld: 96 mg/dL (ref 70–99)
Potassium: 3.7 mmol/L (ref 3.5–5.1)
Sodium: 136 mmol/L (ref 135–145)

## 2022-01-23 LAB — CBC
HCT: 33.5 % — ABNORMAL LOW (ref 39.0–52.0)
Hemoglobin: 11.2 g/dL — ABNORMAL LOW (ref 13.0–17.0)
MCH: 33.1 pg (ref 26.0–34.0)
MCHC: 33.4 g/dL (ref 30.0–36.0)
MCV: 99.1 fL (ref 80.0–100.0)
Platelets: 133 10*3/uL — ABNORMAL LOW (ref 150–400)
RBC: 3.38 MIL/uL — ABNORMAL LOW (ref 4.22–5.81)
RDW: 13.2 % (ref 11.5–15.5)
WBC: 6.3 10*3/uL (ref 4.0–10.5)
nRBC: 0 % (ref 0.0–0.2)

## 2022-01-23 LAB — GLUCOSE, CAPILLARY
Glucose-Capillary: 99 mg/dL (ref 70–99)
Glucose-Capillary: 76 mg/dL (ref 70–99)
Glucose-Capillary: 86 mg/dL (ref 70–99)
Glucose-Capillary: 123 mg/dL — ABNORMAL HIGH (ref 70–99)

## 2022-01-23 LAB — CULTURE, BLOOD (ROUTINE X 2)
Culture: NO GROWTH
Culture: NO GROWTH
Special Requests: ADEQUATE
Special Requests: ADEQUATE

## 2022-01-23 LAB — MAGNESIUM: Magnesium: 2.1 mg/dL (ref 1.7–2.4)

## 2022-01-23 LAB — VITAMIN B12: Vitamin B-12: 218 pg/mL (ref 180–914)

## 2022-01-23 LAB — PHOSPHORUS: Phosphorus: 2.5 mg/dL (ref 2.5–4.6)

## 2022-01-23 LAB — FOLATE: Folate: 20.1 ng/mL (ref >5.9)

## 2022-01-23 SURGERY — PACEMAKER IMPLANT

## 2022-01-23 MED ORDER — CHLORHEXIDINE GLUCONATE 4 % EX LIQD: 60.0000 mL | Freq: Once | CUTANEOUS | Status: DC

## 2022-01-23 MED ORDER — SODIUM CHLORIDE 0.9 % IV SOLN
INTRAVENOUS | Status: AC
  Filled 2022-01-23: qty 2

## 2022-01-23 MED ORDER — FENTANYL CITRATE (PF) 100 MCG/2ML IJ SOLN
INTRAMUSCULAR | Status: AC
  Filled 2022-01-23: qty 2

## 2022-01-23 MED ORDER — SODIUM CHLORIDE 0.9 % IV SOLN: INTRAVENOUS | Status: DC

## 2022-01-23 MED ORDER — SODIUM CHLORIDE 0.9 % IV SOLN: INTRAVENOUS | Status: DC | PRN

## 2022-01-23 MED ORDER — VANCOMYCIN HCL IN DEXTROSE 1-5 GM/200ML-% IV SOLN
INTRAVENOUS | Status: AC
  Filled 2022-01-23: qty 200

## 2022-01-23 MED ORDER — ONDANSETRON HCL 4 MG/2ML IJ SOLN
4.0000 mg | Freq: Four times a day (QID) | INTRAMUSCULAR | Status: DC | PRN
Start: 2022-01-23 — End: 2022-01-25

## 2022-01-23 MED ORDER — VANCOMYCIN HCL IN DEXTROSE 1-5 GM/200ML-% IV SOLN
1000.0000 mg | Freq: Two times a day (BID) | INTRAVENOUS | Status: AC
  Administered 2022-01-24: 1000 mg via INTRAVENOUS
  Filled 2022-01-23: qty 200

## 2022-01-23 MED ORDER — ACETAMINOPHEN 325 MG PO TABS
325.0000 mg | ORAL_TABLET | ORAL | Status: DC | PRN
  Administered 2022-01-24: 650 mg via ORAL
  Filled 2022-01-23: qty 2

## 2022-01-23 MED ORDER — MIDAZOLAM HCL 5 MG/5ML IJ SOLN
INTRAMUSCULAR | Status: AC
  Filled 2022-01-23: qty 5

## 2022-01-23 MED ORDER — HEPARIN (PORCINE) IN NACL 1000-0.9 UT/500ML-% IV SOLN
INTRAVENOUS | Status: DC | PRN
  Administered 2022-01-23: 500 mL

## 2022-01-23 MED ORDER — VANCOMYCIN HCL IN DEXTROSE 1-5 GM/200ML-% IV SOLN: 1000.0000 mg | Freq: Two times a day (BID) | INTRAVENOUS | Status: DC

## 2022-01-23 MED ORDER — FUROSEMIDE 10 MG/ML IJ SOLN
INTRAMUSCULAR | Status: AC
  Filled 2022-01-23: qty 4

## 2022-01-23 MED ORDER — LIDOCAINE HCL (PF) 1 % IJ SOLN
INTRAMUSCULAR | Status: DC | PRN
  Administered 2022-01-23: 60 mL

## 2022-01-23 MED ORDER — MIDAZOLAM HCL 5 MG/5ML IJ SOLN
INTRAMUSCULAR | Status: DC | PRN
  Administered 2022-01-23: 2 mg via INTRAVENOUS

## 2022-01-23 MED ORDER — FUROSEMIDE 10 MG/ML IJ SOLN
INTRAMUSCULAR | Status: DC | PRN
  Administered 2022-01-23: 40 mg via INTRAVENOUS

## 2022-01-23 MED ORDER — SODIUM CHLORIDE 0.9 % IV SOLN
80.0000 mg | INTRAVENOUS | Status: AC
  Administered 2022-01-23: 80 mg
  Filled 2022-01-23: qty 2

## 2022-01-23 MED ORDER — FENTANYL CITRATE (PF) 100 MCG/2ML IJ SOLN
INTRAMUSCULAR | Status: DC | PRN
  Administered 2022-01-23: 25 ug via INTRAVENOUS

## 2022-01-23 MED ORDER — VANCOMYCIN HCL IN DEXTROSE 1-5 GM/200ML-% IV SOLN
1000.0000 mg | INTRAVENOUS | Status: AC
  Administered 2022-01-23: 1000 mg via INTRAVENOUS

## 2022-01-23 MED ORDER — LIDOCAINE HCL (PF) 1 % IJ SOLN
INTRAMUSCULAR | Status: AC
  Filled 2022-01-23: qty 60

## 2022-01-23 SURGICAL SUPPLY — 13 items
CABLE SURGICAL S-101-97-12 (CABLE) ×2 IMPLANT
CATH RIGHTSITE C315HIS02 (CATHETERS) ×1 IMPLANT
HEMOSTAT SURGICEL 2X4 FIBR (HEMOSTASIS) ×1 IMPLANT
LEAD SELECT SECURE 3830 383069 (Lead) IMPLANT
LEAD TENDRIL MRI 52CM LPA1200M (Lead) ×1 IMPLANT
PACEMAKER ASSURITY DR-RF (Pacemaker) ×1 IMPLANT
PAD DEFIB RADIO PHYSIO CONN (PAD) ×2 IMPLANT
SELECT SECURE 3830 383069 (Lead) ×2 IMPLANT
SHEATH 7FR PRELUDE SNAP 13 (SHEATH) ×1 IMPLANT
SHEATH 8FR PRELUDE SNAP 13 (SHEATH) ×1 IMPLANT
SLITTER 6232ADJ (MISCELLANEOUS) ×1 IMPLANT
TRAY PACEMAKER INSERTION (PACKS) ×2 IMPLANT
WIRE HI TORQ VERSACORE-J 145CM (WIRE) ×1 IMPLANT

## 2022-01-23 NOTE — Progress Notes (Signed)
OT Cancellation Note  Patient Details Name: Eric [name] MRN: [id number] DOB: 19-Jan-1946   Cancelled Treatment:    Reason Eval/Treat Not Completed: Medical issues which prohibited therapy.  Bedrest and scheduled for permanent pacemaker this date.  Continue efforts.    Philopateer Strine D Kauri Garson 01/23/2022, 10:03 AM 01/23/2022  RP, OTR/L  Acute Rehabilitation Services  Office:  [phone]

## 2022-01-23 NOTE — TOC Progression Note (Signed)
Transition of Care [facility]) - Progression Note    Patient Details  Name: Eric [name] MRN: [id number] Date of Birth: 06/21/1945  [facility] CM/SW Contact  Graves-Bigelow, Lamar Laundry, RN Phone Number: 01/23/2022, 2:46 PM  Clinical Narrative: Case Manager confirmed that [facility] will be able to service the patient for home health needs. Office to call the patient with visit times. Case Manager made son Eric [name] aware of services. Case Manager will continue to follow for transition of care needs as the patient progresses.   Patient will need HH orders for PT/OT/Aide. Family has all DME needed.   Expected Discharge Plan: Home w Home Health Services Barriers to Discharge: Continued Medical Work up  Expected Discharge Plan and Services Expected Discharge Plan: Home w Home Health Services In-house Referral: NA Discharge Planning Services: CM Consult Post Acute Care Choice: Home Health Living arrangements for the past 2 months: Single Family Home                   HH Arranged: PT, OT, Nurse's Aide HH Agency: [facility] Date [facility] Agency Contacted: 01/22/22 Time HH Agency Contacted: [id number] Representative spoke with at [facility] Agency: Kandee Keen  Readmission Risk Interventions     No data to display

## 2022-01-23 NOTE — Progress Notes (Signed)
PT Cancellation Note  Patient Details Name: Eric [name] MRN: [id number] DOB: November 30, 1945   Cancelled Treatment:    Reason Eval/Treat Not Completed: Patient to have PPM placement today. Will follow-up for PT treatment as schedule permits.  Ina Homes, PT, DPT Acute Rehabilitation Services  Personal: Secure Chat Rehab Office: [phone]  Malachy Chamber 01/23/2022, 1:31 PM

## 2022-01-23 NOTE — Progress Notes (Signed)
NAME:  Eric [name], MRN:  [id number], DOB:  [birth year], LOS: 6 ADMISSION DATE:  01/17/2022, CONSULTATION DATE:  8/10 REFERRING MD:  Dr. Cristal Deer, CHIEF COMPLAINT:  Cardiac arrest   History of Present Illness:  Patient is encephalopathic and/or intubated. Therefore history has been obtained from chart review.   76 year old male with PMH as below, which is significant for aortic stenosis s/p bioprosthetic replacement in 2019, traumatic SAH, HTN, HLD, hypothyroid, RA on methotrexate, and prostate Ca s/p radioactive seeds. He was in his usual state of health on 8/10. Wife left the house to run errands. He did attempt to call her at some point, when she tired to call back he did not answer. She arrived home to find him altered after a fall. EMS was called and upon their arrival he was awake and found to be in complete heart block. He was externally paced for about 10 mins en route, but subsequently lose pulses. CPR initiated and [facility] airway placed. ACLS ongoing upon arrival to ED. ROSC achieved 1st pulse check in ED. 3rd degree block. Cardiology consulted. Epi drip started for shock. Head CT negative post fall. Patient taken to the cath lab for further evaluation. Transvenous pacemaker placed in the cath lab.   Pertinent  Medical History  ortic stenosis s/p bioprosthetic replacement in 2019, traumatic SAH, HTN, HLD, hypothyroid, RA on methotrexate, and prostate Ca s/p radioactive seeds.  Significant Hospital Events: Including procedures, antibiotic start and stop dates in addition to other pertinent events   8/10 cardiac arrest, CHB, transvenous pacer placed. Approx CPR 8/11 Remains intubated, spiked fever,  Extubated 8/13 8/15 negative rate 75-80, no longer paced (backup 40)  Interim History / Subjective:   Temporary pacer wire in place but no episodes where he required pacing Comfortable  Objective   Blood pressure (!) 147/77, pulse 80, temperature 99.7 F (37.6 C), temperature  source Axillary, resp. rate 19, height 5\' 9"  (1.753 m), weight 88 kg, SpO2 93 %.        Intake/Output Summary (Last 24 hours) at 01/23/2022 0926 Last data filed at 01/23/2022 0500 Gross per 24 hour  Intake 90 ml  Output 730 ml  Net -640 ml   Filed Weights   01/21/22 0500 01/22/22 0558 01/23/22 0500  Weight: 87.1 kg 87.4 kg 88 kg    General: Elderly gentleman, comfortable, awake HEENT: Scattered abrasions on face and bridge of nose Neuro: Awake, alert, interacting appropriately, moves all extremities with good strength CV: Regular, 75-80, not paced currently but wire in place, prolonged QRS on telemetry PULM: Clear bilaterally, no wheezes or crackles GI: Nondistended, positive bowel sounds Extremities: Trace pretibial edema Skin: No rash GU: Foley in place  Resolved Hospital Problem list     Assessment & Plan:   Cardiac arrest Complete heart block Cardiogenic shock Acute Hypoxic Respiratory Failure s/p transvenous pacemaker 8/10. Possibly due to beta blocker Plan: -Appreciate EP evaluation.  Planning for PPM placement and then removal of his temporary pacing wire -Continue push pulmonary hygiene (treating pneumonia as below) -PT/OT/out of bed  AKI, improved Hyperkalemia Plan: -Follow urine output, BMP -Renal dose medications -Avoid nephrotoxins  Fever, aspiration pneumonia New 8/11, imaging concerning for aspiration PNA Plan: -Ceftriaxone completed -Push pulmonary hygiene -Follow intermittent chest x-ray  HTN HLD Plan: -Home carvedilol, spironolactone and amlodipine are on hold.  Restart when blood pressure will allow  RA Plan: -Methotrexate on hold, plan to restart once acute issues resolved  Hypothyroidism Plan: -Levothyroxine   Best Practice (  right click and "Reselect all SmartList Selections" daily)   Diet/type: Regular consistency (see orders),  DVT prophylaxis: prophylactic heparin  GI prophylaxis: PPI Lines: Central line Foley:  Yes, and  it is still needed Code Status:  full code Last date of multidisciplinary goals of care discussion   Patient and son updated 8/16  Labs   CBC: Recent Labs  Lab 01/18/22 0422 01/18/22 1443 01/19/22 0432 01/20/22 0545 01/23/22 0536  WBC 15.3* 15.8* 15.0* 8.6 6.3  HGB 11.9* 12.9* 11.1* 10.7* 11.2*  HCT 35.4* 38.3* 33.8* 32.2* 33.5*  MCV 100.3* 98.5 100.9* 100.3* 99.1  PLT 233 205 164 103* 133*    Basic Metabolic Panel: Recent Labs  Lab 01/17/22 2057 01/17/22 2058 01/18/22 0422 01/18/22 1443 01/19/22 0432 01/20/22 0545 01/21/22 0625 01/22/22 0430 01/23/22 0536  NA 135   < > 136   < > 136 139 138 137 136  K 5.8*   < > 5.3*   < > 4.2 3.9 3.5 3.7 3.7  CL 106  --  107   < > 107 107 106 106 103  CO2 22  --  18*   < > 22 24 22 26 25   GLUCOSE 148*  --  201*   < > 161* 99 115* 87 96  BUN 24*  --  30*   < > 22 20 21 18 15   CREATININE 2.08*  --  2.40*   < > 1.56* 1.50* 1.14 1.05 1.01  CALCIUM 8.7*  --  8.3*   < > 8.3* 8.4* 8.4* 8.2* 8.5*  MG 2.2  --  2.1  --  2.0  --   --   --  2.1  PHOS  --   --  4.0  --  2.2* 3.4  --   --  2.5   < > = values in this interval not displayed.   GFR: Estimated Creatinine Clearance: 68.3 mL/min (by C-G formula based on SCr of 1.01 mg/dL). Recent Labs  Lab 01/18/22 1443 01/18/22 1813 01/19/22 0432 01/20/22 0545 01/23/22 0536  WBC 15.8*  --  15.0* 8.6 6.3  LATICACIDVEN 3.6* 3.4*  --   --   --     Liver Function Tests: Recent Labs  Lab 01/17/22 2057 01/18/22 1443 01/20/22 0545  AST 158* 70* 45*  ALT 104* 74* 45*  ALKPHOS 82 65 58  BILITOT 1.2 0.9 1.5*  PROT 6.4* 5.9* 6.0*  ALBUMIN 3.9 3.3* 3.1*   No results for input(s): "LIPASE", "AMYLASE" in the last 168 hours. No results for input(s): "AMMONIA" in the last 168 hours.  ABG    Component Value Date/Time   PHART 7.354 01/17/2022 2058   PCO2ART 41.6 01/17/2022 2058   PO2ART 327 (H) 01/17/2022 2058   HCO3 23.2 01/17/2022 2058   TCO2 24 01/17/2022 2058   ACIDBASEDEF 2.0  01/17/2022 2058   O2SAT 100 01/17/2022 2058     Coagulation Profile: No results for input(s): "INR", "PROTIME" in the last 168 hours.  Cardiac Enzymes: No results for input(s): "CKTOTAL", "CKMB", "CKMBINDEX", "TROPONINI" in the last 168 hours.   Levy Pupa, MD, PhD 01/23/2022, 9:26 AM [facility] [phone] or if no answer before 7:00PM call [phone] For any issues after 7:00PM please call eLink [phone]

## 2022-01-23 NOTE — Progress Notes (Signed)
Patient Name: Eric [name]      SUBJECTIVE:with chest pain with breathing     Scheduled Meds:  Scheduled Meds:  chlorhexidine  60 mL Topical Once   chlorhexidine  60 mL Topical Once   Chlorhexidine Gluconate Cloth  6 each Topical Daily   docusate sodium  100 mg Oral BID   gentamicin (GARAMYCIN) 80 mg in sodium chloride 0.9 % 500 mL irrigation  80 mg Irrigation On Call   insulin aspart  0-15 Units Subcutaneous Q4H   levothyroxine  88 mcg Oral Q0600   midazolam  2 mg Intravenous Once   sodium chloride flush  3 mL Intravenous Q12H   Continuous Infusions:  sodium chloride     sodium chloride     sodium chloride     vancomycin     sodium chloride, acetaminophen, docusate sodium, fentaNYL (SUBLIMAZE) injection, guaiFENesin, ondansetron (ZOFRAN) IV, mouth rinse, oxyCODONE, sodium chloride flush    PHYSICAL EXAM Vitals:   01/23/22 0200 01/23/22 0300 01/23/22 0400 01/23/22 0500  BP: 117/69 131/69  (!) 147/77  Pulse: 75 71 73 80  Resp: 18 16 18 19   Temp:      TempSrc:      SpO2: 90% 91% 92% 93%  Weight:    88 kg  Height:        BP (!) 147/77   Pulse 80   Temp 99.7 F (37.6 C) (Axillary)   Resp 19   Ht 5\' 9"  (1.753 m)   Wt 88 kg   SpO2 93%   BMI 28.65 kg/m  Well developed and nourished in no acute distress HENT normal Neck supple Clear Regular rate and rhythm, no murmurs or gallops Abd-soft with active BS No Clubbing cyanosis edema Skin-warm and dry A & Oriented  Grossly normal sensory and motor function    TELEMETRY: Reviewed personnally pt in snus with LBBB:  EMS reviewed sinus with intermittent CHB    Intake/Output Summary (Last 24 hours) at 01/23/2022 0808 Last data filed at 01/23/2022 0500 Gross per 24 hour  Intake 100 ml  Output 730 ml  Net -630 ml    LABS: Basic Metabolic Panel: Recent Labs  Lab 01/18/22 0422 01/18/22 1443 01/19/22 0432 01/20/22 0545 01/21/22 0625 01/22/22 0430 01/23/22 0536  NA 136 135 136 139 138 137  136  K 5.3* 4.7 4.2 3.9 3.5 3.7 3.7  CL 107 105 107 107 106 106 103  CO2 18* 19* 22 24 22 26 25   GLUCOSE 201* 174* 161* 99 115* 87 96  BUN 30* 30* 22 20 21 18 15   CREATININE 2.40* 1.95* 1.56* 1.50* 1.14 1.05 1.01  CALCIUM 8.3* 8.7* 8.3* 8.4* 8.4* 8.2* 8.5*  MG 2.1  --  2.0  --   --   --  2.1  PHOS 4.0  --  2.2* 3.4  --   --  2.5   Cardiac Enzymes: No results for input(s): "CKTOTAL", "CKMB", "CKMBINDEX", "TROPONINI" in the last 72 hours. CBC: Recent Labs  Lab 01/17/22 1755 01/17/22 1813 01/17/22 2057 01/17/22 2058 01/18/22 0422 01/18/22 1443 01/19/22 0432 01/20/22 0545  WBC 10.6*  --  15.2*  --  15.3* 15.8* 15.0* 8.6  HGB 12.8* 13.9 12.7* 12.9* 11.9* 12.9* 11.1* 10.7*  HCT 40.3 41.0 37.7* 38.0* 35.4* 38.3* 33.8* 32.2*  MCV 104.9*  --  99.7  --  100.3* 98.5 100.9* 100.3*  PLT 237  --  266  --  233 205 164 103*  ASSESSMENT AND PLAN:  Principal Problem:   Complete heart block (HCC) Active Problems:   Heart block AV complete (HCC)   Cardiac arrest (HCC)   Cardiogenic shock (HCC) Anemia   For pacing for intermittent complete heart block in setting of chronic bifasicular block  The benefits and risks were reviewed including but not limited to death,  perforation, infection, lead dislodgement and device malfunction.  The patient understands agrees and is willing to proceed.   Has anemia which will require eval  Incrased MCV  Signed, Sherryl Manges MD  01/23/2022

## 2022-01-24 ENCOUNTER — Inpatient Hospital Stay (HOSPITAL_COMMUNITY): Payer: Medicare Other

## 2022-01-24 ENCOUNTER — Encounter (HOSPITAL_COMMUNITY): Payer: Self-pay | Admitting: Internal Medicine

## 2022-01-24 LAB — CBC
HCT: 34 % — ABNORMAL LOW (ref 39.0–52.0)
Hemoglobin: 11.6 g/dL — ABNORMAL LOW (ref 13.0–17.0)
MCH: 33 pg (ref 26.0–34.0)
MCHC: 34.1 g/dL (ref 30.0–36.0)
MCV: 96.6 fL (ref 80.0–100.0)
Platelets: 183 10*3/uL (ref 150–400)
RBC: 3.52 MIL/uL — ABNORMAL LOW (ref 4.22–5.81)
RDW: 13.2 % (ref 11.5–15.5)
WBC: 7.4 10*3/uL (ref 4.0–10.5)
nRBC: 0 % (ref 0.0–0.2)

## 2022-01-24 LAB — BASIC METABOLIC PANEL
Anion gap: 10 (ref 5–15)
BUN: 15 mg/dL (ref 8–23)
CO2: 25 mmol/L (ref 22–32)
Calcium: 8.5 mg/dL — ABNORMAL LOW (ref 8.9–10.3)
Chloride: 100 mmol/L (ref 98–111)
Creatinine, Ser: 1 mg/dL (ref 0.61–1.24)
GFR, Estimated: >60 mL/min (ref >60)
Glucose, Bld: 103 mg/dL — ABNORMAL HIGH (ref 70–99)
Potassium: 3.8 mmol/L (ref 3.5–5.1)
Sodium: 135 mmol/L (ref 135–145)

## 2022-01-24 LAB — GLUCOSE, CAPILLARY
Glucose-Capillary: 100 mg/dL — ABNORMAL HIGH (ref 70–99)
Glucose-Capillary: 112 mg/dL — ABNORMAL HIGH (ref 70–99)
Glucose-Capillary: 117 mg/dL — ABNORMAL HIGH (ref 70–99)
Glucose-Capillary: 121 mg/dL — ABNORMAL HIGH (ref 70–99)
Glucose-Capillary: 143 mg/dL — ABNORMAL HIGH (ref 70–99)
Glucose-Capillary: 150 mg/dL — ABNORMAL HIGH (ref 70–99)

## 2022-01-24 LAB — MAGNESIUM: Magnesium: 1.8 mg/dL (ref 1.7–2.4)

## 2022-01-24 MED ORDER — OXYCODONE HCL 5 MG PO TABS: 5.0000 mg | ORAL_TABLET | Freq: Four times a day (QID) | ORAL | Status: DC | PRN

## 2022-01-24 MED ORDER — CARVEDILOL 6.25 MG PO TABS
6.2500 mg | ORAL_TABLET | Freq: Two times a day (BID) | ORAL | Status: DC
  Administered 2022-01-24 – 2022-01-25 (×2): 6.25 mg via ORAL
  Filled 2022-01-24 (×3): qty 1

## 2022-01-24 MED FILL — Vancomycin HCl For IV Soln 1 GM (Base Equivalent): INTRAVENOUS | Qty: 1000 | Status: AC

## 2022-01-24 MED FILL — Vancomycin HCl IV Soln 1000 MG/200ML (Base Equivalent): INTRAVENOUS | Qty: 200 | Status: AC

## 2022-01-24 NOTE — Progress Notes (Signed)
Occupational Therapy Treatment Patient Details Name: Eric [name] MRN: [id number] DOB: [birth year] Today's Date: 01/24/2022   History of present illness 76 y.o. male presents to [facility] hospital on 01/17/2022 with AMS s/p a fall. Pt found to be in complete heart block, losing pulse en route and requiring CPR initiation. Pt underwent transvenous pacer placement in cath lab on 8/10. Imaging concerning for aspiration PNA. Pt extubated on 01/19/2022. PMH includes aortic stenosis s/p biprosthetic replacement in 2019, SAH, HTN, RA, progstate CA.  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   OT comments  Patient s/p PPM, precaution sheet issued and reviewed.  VC's as needed during treatment.  Patient is needing up to Min A for basic mobility at RW level, and Mod A for lower body ADL from a sit/stand level.  Patient will have the needed assist at home, and Eric [name] OT is recommended.  OT will continue efforts in the acute setting.     Recommendations for follow up therapy are one component of a multi-disciplinary discharge planning process, led by the attending physician.  Recommendations may be updated based on patient status, additional functional criteria and insurance authorization.    Follow Up Recommendations  Home health OT    Assistance Recommended at Discharge Intermittent Supervision/Assistance  Patient can return home with the following  A little help with walking and/or transfers;A lot of help with bathing/dressing/bathroom;Assistance with cooking/housework;Assist for transportation   Equipment Recommendations  None recommended by OT    Recommendations for Other Services      Precautions / Restrictions Precautions Precautions: Fall Restrictions Weight Bearing Restrictions: Yes LUE Weight Bearing: Partial weight bearing LUE Partial Weight Bearing Percentage or Pounds: Instructed to limit forceful pushing and pulling.       Mobility Bed Mobility Overal bed mobility: Needs Assistance Bed Mobility: Sidelying to Sit    Sidelying to sit: Min assist, Mod assist            Transfers Overall transfer level: Needs assistance Equipment used: Rolling walker (2 wheels) Transfers: Sit to/from Stand, Bed to chair/wheelchair/BSC Sit to Stand: Min assist     Step pivot transfers: Min assist           Balance Overall balance assessment: Needs assistance Sitting-balance support: Feet supported Sitting balance-Leahy Scale: Good     Standing balance support: Reliant on assistive device for balance Standing balance-Leahy Scale: Poor                             ADL either performed or assessed with clinical judgement   ADL       Grooming: Set up;Supervision/safety;Sitting           Upper Body Dressing : Minimal assistance;Sitting   Lower Body Dressing: Moderate assistance;Sit to/from stand   Toilet Transfer: Minimal assistance;Rolling walker (2 wheels)                  Extremity/Trunk Assessment Upper Extremity Assessment Upper Extremity Assessment: Generalized weakness;LUE deficits/detail LUE Deficits / Details: PPM restrictions to LUE   Lower Extremity Assessment Lower Extremity Assessment: Defer to PT evaluation   Cervical / Trunk Assessment Cervical / Trunk Assessment: Kyphotic    Vision       Perception     Praxis      Cognition Arousal/Alertness: Awake/alert Behavior During Therapy: WFL for tasks assessed/performed Overall Cognitive Status: Within Functional Limits for tasks assessed  General Comments      Pertinent Vitals/ Pain       Pain Assessment Pain Assessment: Faces Faces Pain Scale: Hurts little more Pain Location: chest, ribs from CPR Pain Descriptors / Indicators: Tender Pain Intervention(s): Monitored during session                                                          Frequency  Min 2X/week        Progress Toward  Goals  OT Goals(current goals can now be found in the care plan section)  Progress towards OT goals: Progressing toward goals  Acute Rehab OT Goals OT Goal Formulation: With patient Time For Goal Achievement: 02/04/22 Potential to Achieve Goals: Good  Plan Discharge plan remains appropriate    Co-evaluation                 AM-PAC OT "6 Clicks" Daily Activity     Outcome Measure   Help from another person eating meals?: None Help from another person taking care of personal grooming?: A Little Help from another person toileting, which includes using toliet, bedpan, or urinal?: A Little Help from another person bathing (including washing, rinsing, drying)?: A Lot Help from another person to put on and taking off regular upper body clothing?: A Little Help from another person to put on and taking off regular lower body clothing?: A Lot 6 Click Score: 17    End of Session Equipment Utilized During Treatment: Rolling walker (2 wheels)  OT Visit Diagnosis: Unsteadiness on feet (R26.81);Muscle weakness (generalized) (M62.81)   Activity Tolerance Patient tolerated treatment well   Patient Left in chair;with call bell/phone within reach;with family/visitor present   Nurse Communication Mobility status        Time: 2440-1027 OT Time Calculation (min): 27 min  Charges: OT General Charges $OT Visit: 1 Visit OT Treatments $Self Care/Home Management : 23-37 mins  01/24/2022  RP, OTR/L  Acute Rehabilitation Services  Office:  [phone]   Suzanna Obey 01/24/2022, 10:17 AM

## 2022-01-24 NOTE — TOC Progression Note (Signed)
Transition of Care (TOC) - Progression Note  Donn Pierini RN, BSN Transitions of Care Unit 4E- RN Case Manager See Treatment Team for direct phone #    Patient Details  Name: Ellard Nan MRN: [id number] Date of Birth: June 28, 1945  Transition of Care [facility]) CM/SW Contact  Zenda Alpers Lenn Sink, RN Phone Number: 01/24/2022, 2:40 PM  Clinical Narrative:    Info on Private Duty/In home care provided to pt/family at bedside as per son-Anthony request.   TOC to continue to follow for any additional needs.    Expected Discharge Plan: Home w Home Health Services Barriers to Discharge: Continued Medical Work up  Expected Discharge Plan and Services Expected Discharge Plan: Home w Home Health Services In-house Referral: NA Discharge Planning Services: CM Consult Post Acute Care Choice: Home Health Living arrangements for the past 2 months: Single Family Home                           HH Arranged: PT, OT, Nurse's Aide HH Agency: [facility] Date [facility] Agency Contacted: 01/22/22 Time HH Agency Contacted: 1611 Representative spoke with at [facility] Agency: Kandee Keen   Social Determinants of Health (SDOH) Interventions    Readmission Risk Interventions     No data to display

## 2022-01-24 NOTE — Care Management Important Message (Signed)
Important Message  Patient Details  Name: Eric [name] MRN: [id number] Date of Birth: 1945-11-27   Medicare Important Message Given:  Yes     Renie Ora 01/24/2022, 10:09 AM

## 2022-01-24 NOTE — Progress Notes (Signed)
Physical Therapy Treatment Patient Details Name: Eric [name] MRN: [id number] DOB: [birth year] Today's Date: 01/24/2022   History of Present Illness 76 y.o. male presents to [facility] hospital on 01/17/2022 with AMS s/p a fall. Pt found to be in complete heart block, losing pulse en route and requiring CPR initiation. Pt underwent transvenous pacer placement in cath lab on 8/10. Imaging concerning for aspiration PNA. Pt extubated on 01/19/2022. PMH includes aortic stenosis s/p biprosthetic replacement in 2019, SAH, HTN, RA, progstate CA.  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    PT Comments    Pt received supine and agreeable to session with continued progress. Session focused on gait for increased activity tolerance and safety with mobility. Pt able to come to sitting with up to mod assist to elevate trunk from sidelying to sit with good adherence to PPM precautions. Pt demonstrating increased ambulation with RW and min guard for safety, however pt limited by fatigue and requiring cues throughout for increased stride and upright posture. Educated pt re; activity recommendations, benefits of continued mobility, safety, and precautions, pt verbalizing understanding. Review PPM precautions throughout session. Pt continues to benefit from skilled PT services to progress toward functional mobility goals.    Recommendations for follow up therapy are one component of a multi-disciplinary discharge planning process, led by the attending physician.  Recommendations may be updated based on patient status, additional functional criteria and insurance authorization.  Follow Up Recommendations  Home health PT     Assistance Recommended at Discharge Frequent or constant Supervision/Assistance  Patient can return home with the following A little help with walking and/or transfers;Assistance with cooking/housework;Assist for transportation;A little help with bathing/dressing/bathroom;Help with stairs or ramp for entrance   Equipment  Recommendations  None recommended by PT    Recommendations for Other Services       Precautions / Restrictions Precautions Precautions: Fall Precaution Comments: PPM Restrictions Weight Bearing Restrictions: Yes LUE Weight Bearing: Partial weight bearing LUE Partial Weight Bearing Percentage or Pounds: Instructed to limit forceful pushing and pulling.     Mobility  Bed Mobility Overal bed mobility: Needs Assistance Bed Mobility: Sidelying to Sit, Rolling Rolling: Min guard Sidelying to sit: Min assist, Mod assist       General bed mobility comments: up to mod assist to elevate trunk to sitting from sidelying with HOB flat and without use of bedrails    Transfers Overall transfer level: Needs assistance Equipment used: Rolling walker (2 wheels) Transfers: Sit to/from Stand, Bed to chair/wheelchair/BSC Sit to Stand: Min assist           General transfer comment: min assist to power up without pushing/pulling with LUE    Ambulation/Gait Ambulation/Gait assistance: Min guard Gait Distance (Feet): 167 Feet Assistive device: Rolling walker (2 wheels) Gait Pattern/deviations: Step-through pattern, Decreased stride length, Antalgic, Trunk flexed Gait velocity: slowed     General Gait Details: very slow gait with RW, short steps with cues for alrger stride length throughout, cues for upright trunk and forward gaze   Stairs             Wheelchair Mobility    Modified Rankin (Stroke Patients Only)       Balance Overall balance assessment: Needs assistance Sitting-balance support: Feet supported Sitting balance-Leahy Scale: Good     Standing balance support: Reliant on assistive device for balance Standing balance-Leahy Scale: Poor Standing balance comment: reliant on external support  Cognition Arousal/Alertness: Awake/alert Behavior During Therapy: WFL for tasks assessed/performed Overall Cognitive Status:  Within Functional Limits for tasks assessed                                          Exercises      General Comments General comments (skin integrity, edema, etc.): VSS on RA      Pertinent Vitals/Pain Pain Assessment Pain Assessment: Faces Faces Pain Scale: Hurts little more Pain Location: chest, ribs from CPR Pain Descriptors / Indicators: Tender Pain Intervention(s): Monitored during session, Limited activity within patient's tolerance, Repositioned    Home Living                          Prior Function            PT Goals (current goals can now be found in the care plan section) Acute Rehab PT Goals Patient Stated Goal: home PT Goal Formulation: With patient/family Time For Goal Achievement: 02/04/22    Frequency    Min 3X/week      PT Plan      Co-evaluation              AM-PAC PT "6 Clicks" Mobility   Outcome Measure  Help needed turning from your back to your side while in a flat bed without using bedrails?: A Little Help needed moving from lying on your back to sitting on the side of a flat bed without using bedrails?: A Little Help needed moving to and from a bed to a chair (including a wheelchair)?: A Little Help needed standing up from a chair using your arms (e.g., wheelchair or bedside chair)?: A Little Help needed to walk in hospital room?: A Little Help needed climbing 3-5 steps with a railing? : A Lot 6 Click Score: 17    End of Session Equipment Utilized During Treatment: Gait belt Activity Tolerance: Patient tolerated treatment well Patient left: with call bell/phone within reach;with family/visitor present;in chair Nurse Communication: Mobility status PT Visit Diagnosis: Difficulty in walking, not elsewhere classified (R26.2);Unsteadiness on feet (R26.81);Pain     Time: 5621-3086 PT Time Calculation (min) (ACUTE ONLY): 29 min  Charges:  $Gait Training: 8-22 mins $Therapeutic Activity: 8-22  mins                     Manny Vitolo R. PTA Acute Rehabilitation Services Office: [phone]   Catalina Antigua 01/24/2022, 2:20 PM

## 2022-01-24 NOTE — Progress Notes (Signed)
Progress Note  Patient Name: Eric [name] Date of Encounter: 01/24/2022  [facility] Cardiologist: Dr. Bettina Gavia  Subjective   Chest remains sore, otherwise doing OK, getting ready to work with PT  Inpatient Medications    Scheduled Meds:  Chlorhexidine Gluconate Cloth  6 each Topical Daily   docusate sodium  100 mg Oral BID   insulin aspart  0-15 Units Subcutaneous Q4H   levothyroxine  88 mcg Oral Q0600   midazolam  2 mg Intravenous Once   sodium chloride flush  3 mL Intravenous Q12H   Continuous Infusions:  sodium chloride     sodium chloride 10 mL/hr at 01/23/22 1900   PRN Meds: sodium chloride, sodium chloride, acetaminophen, docusate sodium, fentaNYL (SUBLIMAZE) injection, guaiFENesin, ondansetron (ZOFRAN) IV, mouth rinse, oxyCODONE, sodium chloride flush   Vital Signs    Vitals:   01/23/22 2208 01/24/22 0007 01/24/22 0403 01/24/22 0753  BP: (!) 146/87 (!) 142/82 (!) 143/82 (!) 147/73  Pulse: 85 85 82 84  Resp: '20 19 20 17  '$ Temp: 98.2 F (36.8 C) 98 F (36.7 C) 98.5 F (36.9 C) 98.4 F (36.9 C)  TempSrc: Oral Oral Oral Oral  SpO2: 94% 94% 95% 95%  Weight:      Height:        Intake/Output Summary (Last 24 hours) at 01/24/2022 0909 Last data filed at 01/24/2022 0516 Gross per 24 hour  Intake 1251.88 ml  Output 2800 ml  Net -1548.12 ml      01/23/2022    5:00 AM 01/22/2022    5:58 AM 01/21/2022    5:00 AM  Last 3 Weights  Weight (lbs) 194 lb 0.1 oz 192 lb 10.9 oz 192 lb 0.3 oz  Weight (kg) 88 kg 87.4 kg 87.1 kg      Telemetry    SR 70's-80's - Personally Reviewed  ECG    Yesterday SR, 71, LBBB - Personally Reviewed  Physical Exam   GEN: No acute distress, appears chronically ill, healing abrasions L face Neck: No JVD, L IJ line/temp wire site, dressing is CDI Cardiac: RRR, no murmurs, rubs, or gallops.  Respiratory: CTA b/l. GI: Soft, nontender, non-distended  MS: No edema; No deformity. Neuro:  Nonfocal  Psych: Normal affect    Pacer site: dressing is CDI, no hematoma  Labs    High Sensitivity Troponin:   Recent Labs  Lab 01/17/22 1755 01/17/22 2057 01/18/22 0422  TROPONINIHS 35* 507* 479*     Chemistry Recent Labs  Lab 01/17/22 2057 01/17/22 2058 01/18/22 1443 01/19/22 0432 01/20/22 0545 01/21/22 0625 01/22/22 0430 01/23/22 0536 01/24/22 0307  NA 135   < > 135 136 139   < > 137 136 135  K 5.8*   < > 4.7 4.2 3.9   < > 3.7 3.7 3.8  CL 106   < > 105 107 107   < > 106 103 100  CO2 22   < > 19* 22 24   < > '26 25 25  '$ GLUCOSE 148*   < > 174* 161* 99   < > 87 96 103*  BUN 24*   < > 30* 22 20   < > '18 15 15  '$ CREATININE 2.08*   < > 1.95* 1.56* 1.50*   < > 1.05 1.01 1.00  CALCIUM 8.7*   < > 8.7* 8.3* 8.4*   < > 8.2* 8.5* 8.5*  MG 2.2   < >  --  2.0  --   --   --  2.1 1.8  PROT 6.4*  --  5.9*  --  6.0*  --   --   --   --   ALBUMIN 3.9  --  3.3*  --  3.1*  --   --   --   --   AST 158*  --  70*  --  45*  --   --   --   --   ALT 104*  --  74*  --  45*  --   --   --   --   ALKPHOS 82  --  65  --  58  --   --   --   --   BILITOT 1.2  --  0.9  --  1.5*  --   --   --   --   GFRNONAA 33*   < > 35* 46* 48*   < > >60 >60 >60  ANIONGAP 7   < > '11 7 8   '$ < > '5 8 10   '$ < > = values in this interval not displayed.    Lipids  Recent Labs  Lab 01/21/22 0625  TRIG 157*    Hematology Recent Labs  Lab 01/20/22 0545 01/23/22 0536 01/24/22 0307  WBC 8.6 6.3 7.4  RBC 3.21* 3.38* 3.52*  HGB 10.7* 11.2* 11.6*  HCT 32.2* 33.5* 34.0*  MCV 100.3* 99.1 96.6  MCH 33.3 33.1 33.0  MCHC 33.2 33.4 34.1  RDW 13.5 13.2 13.2  PLT 103* 133* 183   Thyroid  Recent Labs  Lab 01/17/22 2057  TSH 3.615  FREET4 1.44*    BNPNo results for input(s): "BNP", "PROBNP" in the last 168 hours.  DDimer No results for input(s): "DDIMER" in the last 168 hours.   Radiology    DG Chest 2 View  Result Date: 01/24/2022 CLINICAL DATA:  Status post pacemaker. EXAM: CHEST - 2 VIEW COMPARISON:  Chest x-ray dated 01/17/2022.  FINDINGS: Interval placement of a LEFT chest wall pacemaker. Hardware/wires appears appropriately positioned. Stable cardiomegaly. Mild atelectasis and/or small pleural effusion at the LEFT lung base. Lungs otherwise clear. No pneumothorax is seen. No acute-appearing osseous abnormality. IMPRESSION: 1. Status post pacemaker placement. No evidence of procedural complicating feature. No pneumothorax. 2. Mild atelectasis and/or small pleural effusion at the LEFT lung base. 3. Stable cardiomegaly. Electronically Signed   By: Franki Cabot M.D.   On: 01/24/2022 08:11    Cardiac Studies   TTE 01/18/22: IMPRESSIONS   1. Left ventricular ejection fraction, by estimation, is 60 to 65%. The  left ventricle has normal function. The left ventricle has no regional  wall motion abnormalities. There is mild concentric left ventricular  hypertrophy. Left ventricular diastolic  function could not be evaluated.   2. Right ventricular systolic function is normal. The right ventricular  size is normal. Tricuspid regurgitation signal is inadequate for assessing  PA pressure.   3. The mitral valve is normal in structure. No evidence of mitral valve  regurgitation. No evidence of mitral stenosis.   4. The aortic valve was not well visualized. There is a 23 mm Edwards  Intuity valve present in the aortic position.      Aortic valve regurgitation is not visualized. No evidence of aortic  valve stenosis. Aortic valve area, by VTI measures 2.33 cm. Aortic valve  mean gradient measures 2.0 mmHg. Aortic valve Vmax measures 0.91 m/s.   5. The inferior vena cava is dilated in size with >50% respiratory  variability, suggesting right atrial  pressure of 8 mmHg.      Echo 04/28/2019 1. Left ventricular ejection fraction, by visual estimation, is 60 to  65%. The left ventricle has normal function. There is no left ventricular  hypertrophy.   2. Left ventricular diastolic parameters are consistent with Grade I  diastolic  dysfunction (impaired relaxation).   3. Endocardium not imaged well. EF appears normal but cannot completely  exclude mild apical hypokinesis. Can consider repeat echo with Definity  contrast if clinically indicated.   4. Global right ventricle has normal systolic function.The right  ventricular size is normal. No increase in right ventricular wall  thickness.   5. Left atrial size was normal.   6. Right atrial size was normal.   7. Mild mitral annular calcification.   8. The mitral valve is normal in structure. No evidence of mitral valve  regurgitation.   9. The tricuspid valve is normal in structure. Tricuspid valve  regurgitation is trivial.  10. The pulmonic valve was grossly normal. Pulmonic valve regurgitation is  not visualized.  11. Normal pulmonary artery systolic pressure.  12. The aortic valve is tricuspid. Aortic valve regurgitation is not  visualized. Mild aortic valve sclerosis without stenosis.    Monitor 2020 An extended ZIO monitor was performed for 13 days and 17 hours beginning 02/16/2019 to evaluate near syncope. Baseline rhythm is sinus bundle branch block is seen minimum average and maximum heart rates are 44 ,65 and 108 bpm. There were no pauses of 3 seconds or greater and no episodes of high degree AV block or sinus node exit block. Ventricular ectopy was rare with isolated PVCs Supraventricular ectopy was rare with APCs there were 2 brief runs of atrial tachycardia the longest 13 complexes at 128 bpm.  There were no episodes of atrial fibrillation or flutter. There were 5 triggered and 3 diary events.  1 of the triggered events showed infrequent PVCs all the other triggered and diary events showed sinus rhythm and no arrhythmia. Conclusion rare ventricular and supraventricular ectopy without bradycardia.  Triggered and symptomatic events are predominantly unassociated with arrhythmia.  2 brief runs of atrial tachycardia asymptomatic.    10/16/2017: LHC 1. Known  severe aortic stenosis by noninvasive assessment 2. Widely patent coronary arteries with minor luminal irregularity (right dominant) 3. Normal right-sided intracardiac pressures Plan: CTA studies, cardiac surgical evaluation for aortic valve replacement  Patient Profile     76 y.o. male with a hx of VHD s/p AVR (bioprosthetic 2019), HTN, HLD, RA (on methotrexate), prostate Ca (s/p seed implant), hypothyroidism, traumatic SAH admitted with syncope/fall and marked bradycardia/CHB   He was found by family day of admission 01/17/22 altered, had suffered a fall, found in CHB by EMS and transcutaneously paced in route (by record) though lost pulse and started with CPR/airway In the ER + pulse and epi gtt started, CT done of head was negative and emergently brought to the cath lab for TV temp pacer implant Admitted to ICU, home coreg stopped   Subsequently has had some fever placed on antibiotics to cover aspiration   Extubated 01/19/22 AAOx4   OFF Epi gtt 01/20/22 morning Temp pacing turned to 40bpm back up on 01/20/22 with no further pacing noted  Assessment & Plan    CHB He has regained conduction at his baseline LBBB off his coreg   Now s/p PPM implant yesterday with Dr. Caryl Comes Device check this AM with stable findings CXR without ptx Site is stable R IJ site stable Wound care and  activity restrictions reviewed  Resume some coreg today for his BP and remote hx of SVT Labs tomorrow  Anticipate resuming rest of him meds at discharge/home   VHD/TAVR hx Functioning well   Cardiac arrest/brady Suspect/probable aspiration PNA, efebrile last 24hrs Last wbc remains back to wnl and antibiotics completed Shock liver, trending down last check AKI is resolved Extubated on RA Chest wall pain  Mobilize more today Incentive spirometer Anticipate home tomorrow with HHC, he has good family support as well  For questions or updates, please contact [facility] Please consult  [email or web address] for contact info under        Signed, Baldwin Jamaica, PA-C  01/24/2022, 9:09 AM

## 2022-01-24 NOTE — Discharge Summary (Signed)
ELECTROPHYSIOLOGY PROCEDURE DISCHARGE SUMMARY    Patient ID: Eric [name],  MRN: [id number], DOB/AGE: 1945-11-05 76 y.o.  Admit date: 01/17/2022 Discharge date: 01/25/2022  Primary Care Physician: Colon Branch, MD  Electrophysiologist: new, Dr. Caryl Comes  Primary Discharge Diagnosis:  CHB Brady-arrest  Secondary Discharge Diagnosis:  VHD H/o TAVR HTN RA Hypothyroidism SVT  Allergies  Allergen Reactions   Lipitor [Atorvastatin] Other (See Comments)    Myalgia    Penicillins Other (See Comments)    Possibly allergic- family not 030% certain   Zetia [Ezetimibe] Other (See Comments)    Myalgia      Procedures This Admission:  Temp pacing wire placement, Dr. Tamala Julian 2.  Implantation of a Abbott dual chamber PPM on 01/23/22 by Dr Caryl Comes.   CXR on 01/24/22 demonstrated no pneumothorax status post device implantation.   Brief HPI: Eric [name] is a 76 y.o. male was found by family day of admission 01/17/22 altered, had suffered a fall, found in CHB by EMS and transcutaneously paced in route (by record) though lost pulse and started with CPR/airway In the ER + pulse and epi gtt started, CT done of head was negative and emergently brought to the cath lab for TV temp pacer implant Admitted to ICU, home coreg stopped   Hospital Course:  The patient had fever and covered for PNA by CCM, he was able to extubate and get off pressor 01/20/22, TTE noted preserved LVEF.  HS Trop were flat and not felt to represent ACS in setting of profound bradycardia, brady-arrest/respiratory failure.  Noting no obstructive CAD by cath pre-TAVR in 2019.  EP was consulted, initially with recovery of his conduction off coreg suspected he may not require pacing though given his history, baseline LBBB (post TAVR) and RBBB escape, felt best providing PPM implant. he and underwent implantation of a PPM with details as outlined in his procedure report.  He had chest wall pain, no anginal symptoms. He completed  recommended antibiotic course remained afebrile and normalized WBC.  He was monitored on telemetry throughout his stay which demonstrated SR 70's.  Left chest was without hematoma or ecchymosis.  The device was interrogated and found to be functioning normally.  CXR was obtained and demonstrated no pneumothorax status post device implantation.  Wound care, arm mobility, and restrictions were reviewed with the patient. He was evaluated by PT/OT, recommendations for PT/OT/aid at home.   He has not requested or required pain medication for his chest wall pain in over 24 hours and will not require going home.  The patient feels well, he has ambulated in the halls, no SOB, denies any SOB, with steadily improving chest wall discomfort and minimal pacer site discomfort.  He was examined by Dr. Quentin Ore and considered stable for discharge to home.   Home health PT/OT/aide recommended by therapy evaluation/care management teams and have been ordered.  Will stop his amlodipine, this can be resumed outpatient if needed He had high normal and high potassium early in his admission, home spironolactone has been held here and will discontinue it. I do not see a hx of CHF/volume issues    Physical Exam: Vitals:   01/25/22 0416 01/25/22 0500 01/25/22 0806 01/25/22 0933  BP: (!) 144/75  (!) 151/75 (!) 157/81  Pulse: 68  71 77  Resp: '20  20 20  '$ Temp: 98.1 F (36.7 C)  98.3 F (36.8 C)   TempSrc: Oral  Oral   SpO2: 97%  96% 97%  Weight:  86.4  kg    Height:        GEN- The patient is well appearing, alert and oriented x 3 today.   HEENT: normocephalic, atraumatic; sclera clear, conjunctiva pink; hearing intact; oropharynx clear; neck supple, no JVP, R IJ site is stable Lungs- CTA b/l, normal work of breathing.  No wheezes, rales, rhonchi Heart- RRR, no murmurs, rubs or gallops, PMI not laterally displaced GI- soft, non-tender, non-distended Extremities- no clubbing, cyanosis, or edema MS- no significant  deformity or atrophy Skin- warm and dry, no rash or lesion, left chest without hematoma/ecchymosis Psych- euthymic mood, full affect Neuro- no gross deficits   Labs:   Lab Results  Component Value Date   WBC 7.4 01/24/2022   HGB 11.6 (L) 01/24/2022   HCT 34.0 (L) 01/24/2022   MCV 96.6 01/24/2022   PLT 183 01/24/2022    Recent Labs  Lab 01/20/22 0545 01/21/22 0625 01/24/22 0307  NA 139   < > 135  K 3.9   < > 3.8  CL 107   < > 100  CO2 24   < > 25  BUN 20   < > 15  CREATININE 1.50*   < > 1.00  CALCIUM 8.4*   < > 8.5*  PROT 6.0*  --   --   BILITOT 1.5*  --   --   ALKPHOS 58  --   --   ALT 45*  --   --   AST 45*  --   --   GLUCOSE 99   < > 103*   < > = values in this interval not displayed.    Discharge Medications:  Allergies as of 01/25/2022       Reactions   Lipitor [atorvastatin] Other (See Comments)   Myalgia    Penicillins Other (See Comments)   Possibly allergic- family not 662% certain   Zetia [ezetimibe] Other (See Comments)   Myalgia         Medication List     STOP taking these medications    amLODipine 5 MG tablet Commonly known as: NORVASC   spironolactone 25 MG tablet Commonly known as: ALDACTONE       TAKE these medications    acetaminophen 500 MG tablet Commonly known as: TYLENOL Take 500 mg by mouth every 6 (six) hours as needed for mild pain or headache.   aspirin EC 81 MG tablet Take 81 mg by mouth daily. Swallow whole.   carvedilol 12.5 MG tablet Commonly known as: COREG Take 12.5 mg by mouth 2 (two) times daily with a meal.   folic acid 1 MG tablet Commonly known as: FOLVITE Take 1 mg by mouth daily.   levothyroxine 88 MCG tablet Commonly known as: SYNTHROID Take 88 mcg by mouth daily before breakfast.   methotrexate 2.5 MG tablet Commonly known as: RHEUMATREX Take 10 mg by mouth every Friday.   Multivitamin Tabs Take 1 tablet by mouth daily with breakfast.   rosuvastatin 10 MG tablet Commonly known as:  CRESTOR Take 10 mg by mouth at bedtime.   Vitamin D3 50 MCG (2000 UT) Tabs Take 2,000 Units by mouth daily.               Discharge Care Instructions  (From admission, onward)           Start     Ordered   01/25/22 0000  Discharge wound care:       Comments: As per AVS instructions   01/25/22 1050  Disposition: Home Discharge Instructions     Diet - low sodium heart healthy   Complete by: As directed    Discharge wound care:   Complete by: As directed    As per AVS instructions   Increase activity slowly   Complete by: As directed        Follow-up Information     [facility] Follow up.   Specialty: Home Health Services Why: Physical/Occupational Therapy, Aide-office to call with visit times. Contact information: [address] [place] [address] [phone]                 Duration of Discharge Encounter: Greater than 30 minutes including physician time.  Venetia Night, PA-C 01/25/2022 10:52 AM

## 2022-01-25 ENCOUNTER — Encounter: Payer: Self-pay | Admitting: Internal Medicine

## 2022-01-25 ENCOUNTER — Other Ambulatory Visit: Payer: Self-pay

## 2022-01-25 LAB — GLUCOSE, CAPILLARY
Glucose-Capillary: 112 mg/dL — ABNORMAL HIGH (ref 70–99)
Glucose-Capillary: 115 mg/dL — ABNORMAL HIGH (ref 70–99)
Glucose-Capillary: 142 mg/dL — ABNORMAL HIGH (ref 70–99)
Glucose-Capillary: 98 mg/dL (ref 70–99)

## 2022-01-25 NOTE — Plan of Care (Signed)
  Problem: Education: Goal: Knowledge of General Education information will improve Description: Including pain rating scale, medication(s)/side effects and non-pharmacologic comfort measures Outcome: Adequate for Discharge   

## 2022-01-25 NOTE — TOC Transition Note (Signed)
Transition of Care (TOC) - CM/SW Discharge Note Marvetta Gibbons RN, BSN Transitions of Care Unit 4E- RN Case Manager See Treatment Team for direct phone #    Patient Details  Name: Eric [name] MRN: [id number] Date of Birth: Oct 23, 1945  Transition of Care [facility] CM/SW Contact:  Dawayne Patricia, RN Phone Number: 01/25/2022, 11:23 AM   Clinical Narrative:    Pt stable for transition home today, HH has been set up with [place] notified Barb Merino of discharge for start of care.  Cm spoke with pt and sonElberta Fortis at bedside- confirmed [facility] arrangements and services. Cm also provided info and brochures on area private duty/in home care as per son request- son confirmed he had the info that was provided to his sister on 8/17.   Son to transport home, no further TOC needs noted. TOC to sign off at this time.    Final next level of care: [place] Barriers to Discharge: Barriers Resolved   Patient Goals and CMS Choice Patient states their goals for this hospitalization and ongoing recovery are:: to return home CMS Medicare.gov Compare Post Acute Care list provided to:: Patient Represenative (must comment) Choice offered to / list presented to : Spouse, Adult Children  Discharge Placement               Home w/ [facility]        Discharge Plan and Services In-house Referral: NA Discharge Planning Services: CM Consult Post Acute Care Choice: Home Health          DME Arranged: N/A DME Agency: NA       HH Arranged: PT, OT, Nurse's Aide [place] Agency: [place] Date [facility] Agency Contacted: 01/22/22 Time [place]: [id number] Representative spoke with at [place]: [place] Interventions     Readmission Risk Interventions    01/25/2022   11:23 AM  Readmission Risk Prevention Plan  Medication Screening Complete  Transportation Screening Complete

## 2022-01-25 NOTE — Discharge Instructions (Signed)
    Supplemental Discharge Instructions for  Pacemaker/Defibrillator Patients   Activity No heavy lifting or vigorous activity with your left/right arm for 6 to 8 weeks.  Do not raise your left/right arm above your head for one week.  Gradually raise your affected arm as drawn below.             01/28/22                     01/29/22                   01/30/22                  01/31/22 __  NO DRIVING until cleared to at your wound check visit  [place] the wound area clean and dry.  Do not get this area wet , no showers until cleared to at your wound check visit . The tape/steri-strips on your wound will fall off; do not pull them off.  No bandage is needed on the site.  DO  NOT apply any creams, oils, or ointments to the wound area. If you notice any drainage or discharge from the wound, any swelling or bruising at the site, or you develop a fever > 101? F after you are discharged home, call the office at once.  Special Instructions You are still able to use cellular telephones; use the ear opposite the side where you have your pacemaker/defibrillator.  Avoid carrying your cellular phone near your device. When traveling through airports, show security personnel your identification card to avoid being screened in the metal detectors.  Ask the security personnel to use the hand wand. Avoid arc welding equipment, MRI testing (magnetic resonance imaging), TENS units (transcutaneous nerve stimulators).  Call the office for questions about other devices. Avoid electrical appliances that are in poor condition or are not properly grounded. Microwave ovens are safe to be near or to operate.

## 2022-01-25 NOTE — Progress Notes (Signed)
Discharge instructions (including medications) discussed with and copy provided to patient/caregiver 

## 2022-01-25 NOTE — Progress Notes (Signed)
Occupational Therapy Treatment Patient Details Name: Eric [name] MRN: [id number] DOB: 1946/03/11 Today's Date: 01/25/2022   History of present illness 76 y.o. male presents to [facility] hospital on 01/17/2022 with AMS s/p a fall. Pt found to be in complete heart block, losing pulse en route and requiring CPR initiation. Pt underwent transvenous pacer placement in cath lab on 8/10. Imaging concerning for aspiration PNA. Pt extubated on 01/19/2022. Pt with PPM placed 8/17. PMH includes aortic stenosis s/p biprosthetic replacement in 2019, SAH, HTN, RA, progstate CA.  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   OT comments  This 76 yo male doing so much better than when I saw him earlier in week. He is still sore at his chest and ribs from CPR. He is currently at a setup/S with occasional min A for LB ADLs at RW level. He will continue to benefit from acute OT with follow up [place] recommended.   Recommendations for follow up therapy are one component of a multi-disciplinary discharge planning process, led by the attending physician.  Recommendations may be updated based on patient status, additional functional criteria and insurance authorization.    Follow Up Recommendations  Home health OT    Assistance Recommended at Discharge Set up Supervision/Assistance  Patient can return home with the following  A little help with bathing/dressing/bathroom;Assistance with cooking/housework;Assist for transportation;Help with stairs or ramp for entrance   Equipment Recommendations  None recommended by OT       Precautions / Restrictions Precautions Precautions: Fall Precaution Comments: PPM as of yesterday Restrictions Weight Bearing Restrictions: Yes LUE Weight Bearing: Partial weight bearing LUE Partial Weight Bearing Percentage or Pounds: Instructed to limit forceful pushing and pulling, lifting       Mobility Bed Mobility               General bed mobility comments: Pt up sitting in recliner upon my arrival     Transfers Overall transfer level: Needs assistance Equipment used: Rolling walker (2 wheels) Transfers: Sit to/from Stand Sit to Stand: Supervision                 Balance Overall balance assessment: Mild deficits observed, not formally tested (in standing)                                         ADL either performed or assessed with clinical judgement   ADL Overall ADL's : Needs assistance/impaired     Grooming: Set up;Supervision/safety;Standing;Oral care             Upper Body Dressing Details (indicate cue type and reason): Educated on donning pull over shirt (left arm, head, then right arm), doffing same shirts can try pulling off over his head with RUE reaching behind his neck, but if he feels too much pull in his left side when he does this then to reverse the donning process (right arm, head, left arm).   Lower Body Dressing Details (indicate cue type and reason): Pt cannot cross legs to get to feet to donn/doff socks. He says his wife can A with his socks in the short run or he wear slip on shoes that don't require socks.Instructed him I did not want him bending over to do his socks due to possibly too much pull at [facility] site Toilet Transfer: Supervision/safety;Ambulation;Rolling walker (2 wheels);Regular Glass blower/designer Details (indicate cue type and reason): grab bar on right  Extremity/Trunk Assessment Upper Extremity Assessment Upper Extremity Assessment: Overall WFL for tasks assessed            Vision Baseline Vision/History: 1 Wears glasses (reading) Ability to See in Adequate Light: 0 Adequate Patient Visual Report: No change from baseline            Cognition Arousal/Alertness: Awake/alert Behavior During Therapy: WFL for tasks assessed/performed Overall Cognitive Status: Within Functional Limits for tasks assessed                                                General Comments  HR in 80s throughout    Pertinent Vitals/ Pain       Pain Assessment Pain Assessment: Faces Faces Pain Scale: Hurts a little bit Pain Location: chest, ribs from CPR Pain Descriptors / Indicators: Tender Pain Intervention(s): Limited activity within patient's tolerance, Monitored during session, Repositioned         Frequency  Min 2X/week        Progress Toward Goals  OT Goals(current goals can now be found in the care plan section)  Progress towards OT goals: Progressing toward goals  Acute Rehab OT Goals Patient Stated Goal: to go home today OT Goal Formulation: With patient Time For Goal Achievement: 02/04/22 Potential to Achieve Goals: Good  Plan Discharge plan remains appropriate       AM-PAC OT "6 Clicks" Daily Activity     Outcome Measure   Help from another person eating meals?: None Help from another person taking care of personal grooming?: A Little (S) Help from another person toileting, which includes using toliet, bedpan, or urinal?: A Little (S) Help from another person bathing (including washing, rinsing, drying)?: A Little (min A) Help from another person to put on and taking off regular upper body clothing?: A Little (min A) Help from another person to put on and taking off regular lower body clothing?: A Little (min A) 6 Click Score: 19    End of Session Equipment Utilized During Treatment: Rolling walker (2 wheels)  OT Visit Diagnosis: Unsteadiness on feet (R26.81);Muscle weakness (generalized) (M62.81);Pain Pain - part of body:  (ribs/chest from CPR)   Activity Tolerance Patient tolerated treatment well   Patient Left in chair;with call bell/phone within reach           Time: 0939-1001 OT Time Calculation (min): 22 min  Charges: OT General Charges $OT Visit: 1 Visit OT Treatments $Self Care/Home Management : 8-22 mins  Golden Circle, OTR/L Acute Rehab Services [facility] [phone] Office [phone]    Almon Register 01/25/2022, 10:40 AM

## 2022-01-25 NOTE — Progress Notes (Signed)
Physical Therapy Treatment Patient Details Name: Eric [name] MRN: [id number] DOB: 1945/08/07 Today's Date: 01/25/2022   History of Present Illness 76 y.o. male presents to [facility] hospital on 01/17/2022 with AMS s/p a fall. Pt found to be in complete heart block, losing pulse en route and requiring CPR initiation. Pt underwent transvenous pacer placement in cath lab on 8/10. Imaging concerning for aspiration PNA. Pt extubated on 01/19/2022. PMH includes aortic stenosis s/p biprosthetic replacement in 2019, SAH, HTN, RA, progstate CA.  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    PT Comments    Pt received supine and agreeable to session with good progress towards goals. Pt requiring up to min assist for bed mobility to elevate trunk with HOB flat and use of bedrail with RUE. Pt demonstrating improved gait speed and mechanics this session with RW and min guard for safety. Continued education re; activity recommendations, precautions, and benefits of continued mobility. Pt continues to benefit from skilled PT services to progress toward functional mobility goals.    Recommendations for follow up therapy are one component of a multi-disciplinary discharge planning process, led by the attending physician.  Recommendations may be updated based on patient status, additional functional criteria and insurance authorization.  Follow Up Recommendations  Home health PT     Assistance Recommended at Discharge Frequent or constant Supervision/Assistance  Patient can return home with the following A little help with walking and/or transfers;Assistance with cooking/housework;Assist for transportation;A little help with bathing/dressing/bathroom;Help with stairs or ramp for entrance   Equipment Recommendations  None recommended by PT    Recommendations for Other Services       Precautions / Restrictions Precautions Precautions: Fall Precaution Comments: PPM Restrictions Weight Bearing Restrictions: Yes LUE Weight Bearing: Partial  weight bearing LUE Partial Weight Bearing Percentage or Pounds: Instructed to limit forceful pushing and pulling.     Mobility  Bed Mobility Overal bed mobility: Needs Assistance Bed Mobility: Sidelying to Sit, Rolling Rolling: Min guard Sidelying to sit: Min assist       General bed mobility comments: light min asssit to elevate trunk, good adherence to PM precatuions    Transfers Overall transfer level: Needs assistance Equipment used: Rolling walker (2 wheels) Transfers: Sit to/from Stand, Bed to chair/wheelchair/BSC Sit to Stand: Min guard           General transfer comment: min guard for safety    Ambulation/Gait Ambulation/Gait assistance: Min guard Gait Distance (Feet): 350 Feet Assistive device: Rolling walker (2 wheels) Gait Pattern/deviations: Step-through pattern, Decreased stride length, Antalgic, Trunk flexed Gait velocity: slightly decr     General Gait Details: steady gait with RW, much improved gait speed this session, good RW management   Stairs             Wheelchair Mobility    Modified Rankin (Stroke Patients Only)       Balance Overall balance assessment: Needs assistance Sitting-balance support: Feet supported Sitting balance-Leahy Scale: Good     Standing balance support: Reliant on assistive device for balance Standing balance-Leahy Scale: Poor Standing balance comment: reliant on external support                            Cognition Arousal/Alertness: Awake/alert Behavior During Therapy: WFL for tasks assessed/performed Overall Cognitive Status: Within Functional Limits for tasks assessed  Exercises      General Comments General comments (skin integrity, edema, etc.): HR in 80s throughout      Pertinent Vitals/Pain Pain Assessment Pain Assessment: Faces Faces Pain Scale: Hurts a little bit Pain Location: chest, ribs from CPR Pain Descriptors  / Indicators: Tender Pain Intervention(s): Monitored during session, Limited activity within patient's tolerance    Home Living                          Prior Function            PT Goals (current goals can now be found in the care plan section) Acute Rehab PT Goals Patient Stated Goal: home PT Goal Formulation: With patient/family Time For Goal Achievement: 02/04/22    Frequency    Min 3X/week      PT Plan      Co-evaluation              AM-PAC PT "6 Clicks" Mobility   Outcome Measure  Help needed turning from your back to your side while in a flat bed without using bedrails?: A Little Help needed moving from lying on your back to sitting on the side of a flat bed without using bedrails?: A Little Help needed moving to and from a bed to a chair (including a wheelchair)?: A Little Help needed standing up from a chair using your arms (e.g., wheelchair or bedside chair)?: A Little Help needed to walk in hospital room?: A Little Help needed climbing 3-5 steps with a railing? : A Little 6 Click Score: 18    End of Session   Activity Tolerance: Patient tolerated treatment well Patient left: with call bell/phone within reach;in chair Nurse Communication: Mobility status PT Visit Diagnosis: Difficulty in walking, not elsewhere classified (R26.2);Unsteadiness on feet (R26.81);Pain     Time: 7867-6720 PT Time Calculation (min) (ACUTE ONLY): 21 min  Charges:  $Gait Training: 8-22 mins                     Olanrewaju Osborn R. PTA Acute Rehabilitation Services Office: [place] 01/25/2022, 9:23 AM

## 2022-01-28 ENCOUNTER — Telehealth: Payer: Self-pay | Admitting: Internal Medicine

## 2022-01-28 NOTE — Telephone Encounter (Signed)
  Pt's son, would like to request if Dr. 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

## 2022-01-29 ENCOUNTER — Encounter: Payer: Self-pay | Admitting: Internal Medicine

## 2022-01-29 NOTE — Telephone Encounter (Signed)
This has been addressed in another encounter dated 01/29/2022.  Please see that encounter for complete details.

## 2022-01-29 NOTE — Telephone Encounter (Signed)
Son called requesting a doctor's note for his employer to come from Dr. 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  Son stated he can check the patient's MyChart to receive the letter or he can come to the office to pick it up.

## 2022-02-01 ENCOUNTER — Telehealth: Payer: Self-pay | Admitting: Internal Medicine

## 2022-02-01 NOTE — Telephone Encounter (Signed)
Caller/Agency: Timmothy Sours [facility]) [place] Number: [phone] Requesting OT/PT/Skilled Nursing/Social Work/Speech Therapy: OT Frequency: 1 w 4 for ADL Transfer, Exercise and pain control starting 9.22.23

## 2022-02-01 NOTE — Telephone Encounter (Signed)
Spoke w/ Timmothy Sours- verbal orders given.

## 2022-02-05 ENCOUNTER — Telehealth: Payer: Self-pay

## 2022-02-05 NOTE — Telephone Encounter (Signed)
Plan of care received from [facility]- placed in PCP red folder to be signed when he returns.

## 2022-02-06 ENCOUNTER — Ambulatory Visit: Payer: Medicare Other | Attending: Cardiology

## 2022-02-06 DIAGNOSIS — I442 Atrioventricular block, complete: Secondary | ICD-10-CM | POA: Diagnosis not present

## 2022-02-06 LAB — CUP PACEART INCLINIC DEVICE CHECK
Battery Remaining Longevity: 138 mo
Battery Voltage: 3.11 V
Brady Statistic RA Percent Paced: 11 %
Brady Statistic RV Percent Paced: 0 %
Date Time Interrogation Session: 20230830164027
Implantable Lead Implant Date: 20230816
Implantable Lead Implant Date: 20230816
Implantable Lead Location: 753859
Implantable Lead Location: 753860
Implantable Lead Model: 3830
Implantable Pulse Generator Implant Date: 20230816
Lead Channel Impedance Value: 475 Ohm
Lead Channel Impedance Value: 637.5 Ohm
Lead Channel Pacing Threshold Amplitude: 0.5 V
Lead Channel Pacing Threshold Amplitude: 0.5 V
Lead Channel Pacing Threshold Amplitude: 0.75 V
Lead Channel Pacing Threshold Amplitude: 0.75 V
Lead Channel Pacing Threshold Pulse Width: 0.5 ms
Lead Channel Pacing Threshold Pulse Width: 0.5 ms
Lead Channel Pacing Threshold Pulse Width: 0.5 ms
Lead Channel Pacing Threshold Pulse Width: 0.5 ms
Lead Channel Sensing Intrinsic Amplitude: 1.2 mV
Lead Channel Sensing Intrinsic Amplitude: 12 mV
Lead Channel Setting Pacing Amplitude: 3.5 V
Lead Channel Setting Pacing Amplitude: 3.5 V
Lead Channel Setting Pacing Pulse Width: 0.5 ms
Lead Channel Setting Sensing Sensitivity: 2 mV
Pulse Gen Model: 2272
Pulse Gen Serial Number: 8101539

## 2022-02-06 NOTE — Patient Instructions (Addendum)
   After Your Pacemaker   Monitor your pacemaker site for redness, swelling, and drainage. Call the device clinic at [phone] if you experience these symptoms or fever/chills.  Your incision was closed with Steri-strips or staples:  You may shower 7 days after your procedure and wash your incision with soap and water. Avoid lotions, ointments, or perfumes over your incision until it is well-healed.  You may use a hot tub or a pool after your wound check appointment if the incision is completely closed.  Do not lift, push or pull greater than 10 pounds with the affected arm until 6 weeks after your procedure. March 07, 2022 your restrictions are lifted.  There are no other restrictions in arm movement after your wound check appointment.  You may drive, unless driving has been restricted by your healthcare providers.  Your Pacemaker is NOT MRI compatible.  Remote monitoring is used to monitor your pacemaker from home. This monitoring is scheduled every 91 days by our office. It allows [place] to keep an eye on the functioning of your device to ensure it is working properly. You will routinely see your Electrophysiologist annually (more often if necessary).

## 2022-02-06 NOTE — Progress Notes (Signed)

## 2022-02-09 ENCOUNTER — Other Ambulatory Visit: Payer: Self-pay | Admitting: Rheumatology

## 2022-02-10 MED FILL — Medication: Qty: 1 | Status: AC

## 2022-02-12 DIAGNOSIS — N179 Acute kidney failure, unspecified: Secondary | ICD-10-CM

## 2022-02-12 DIAGNOSIS — Z953 Presence of xenogenic heart valve: Secondary | ICD-10-CM

## 2022-02-12 DIAGNOSIS — Z9181 History of falling: Secondary | ICD-10-CM

## 2022-02-12 DIAGNOSIS — J690 Pneumonitis due to inhalation of food and vomit: Secondary | ICD-10-CM | POA: Diagnosis not present

## 2022-02-12 DIAGNOSIS — Z7982 Long term (current) use of aspirin: Secondary | ICD-10-CM

## 2022-02-12 DIAGNOSIS — E039 Hypothyroidism, unspecified: Secondary | ICD-10-CM

## 2022-02-12 DIAGNOSIS — I442 Atrioventricular block, complete: Secondary | ICD-10-CM | POA: Diagnosis not present

## 2022-02-12 DIAGNOSIS — Z8546 Personal history of malignant neoplasm of prostate: Secondary | ICD-10-CM

## 2022-02-12 DIAGNOSIS — M069 Rheumatoid arthritis, unspecified: Secondary | ICD-10-CM

## 2022-02-12 DIAGNOSIS — I10 Essential (primary) hypertension: Secondary | ICD-10-CM

## 2022-02-12 DIAGNOSIS — E785 Hyperlipidemia, unspecified: Secondary | ICD-10-CM

## 2022-02-12 DIAGNOSIS — J9601 Acute respiratory failure with hypoxia: Secondary | ICD-10-CM | POA: Diagnosis not present

## 2022-02-12 DIAGNOSIS — Z48812 Encounter for surgical aftercare following surgery on the circulatory system: Secondary | ICD-10-CM | POA: Diagnosis not present

## 2022-02-12 DIAGNOSIS — Z950 Presence of cardiac pacemaker: Secondary | ICD-10-CM

## 2022-02-12 NOTE — Telephone Encounter (Signed)
Next Visit: 03/20/2022   Last Visit: 10/18/2021   Last Fill: 11/19/2021   DX: Seronegative rheumatoid arthritis    Current Dose per office note 10/18/2021: Methotrexate 4 tablets by mouth once weekly    Labs: 01/24/2022 RBC 3.52, Hgb 11.6, Hct 34.0, Glucose 103, Calcium 8.5   Patient updated labs today.    Okay to refill MTX?

## 2022-02-12 NOTE — Telephone Encounter (Signed)
Form signed and faxed back to [facility] at [phone]. Form sent for scanning.

## 2022-02-20 NOTE — Progress Notes (Signed)
Cardiology Office Note:    Date:  02/21/2022   ID:  Bjorn Hallas, DOB [birth year], MRN [id number]  PCP:  Colon Branch, MD  Cardiologist:  Shirlee More, MD    Referring MD: Colon Branch, MD    ASSESSMENT:    1. 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 Left bundle branch block (LBBB)   3. Essential hypertension   4. Hyperlipidemia, unspecified hyperlipidemia type   5. Heart block AV complete ([place])   6. Pacemaker    PLAN:    In order of problems listed above:  He appears to have recovered from his hospitalization with heart block aspiration pneumonia transient shock back to his previous level of function. No evidence of prosthetic valve dysfunction Blood pressure is stable at target continue treatment with his beta-blocker Continue his high intensity statin He will follow in our device clinic   Next appointment: 6 months   Medication Adjustments/Labs and Tests Ordered: Current medicines are reviewed at length with the patient today.  Concerns regarding medicines are outlined above.  No orders of the defined types were placed in this encounter.  No orders of the defined types were placed in this encounter.   Chief Complaint  Patient presents with   Follow-up    [place] visit for pacemaker issue    History of Present Illness:    Bernhard Koskinen is a 76 y.o. male with a hx of severe aortic stenosis symptomatic with surgical bioprosthetic AVR left bundle branch block hypertension hyperlipidemia hypothyroidism subsequent fall and subarachnoid hemorrhage COVID-19 infection prostate cancer with brachytherapy and recent Medtronic dual-chamber pacemaker 01/19/2022 for heart block last seen 05/15/2021.  His course was complicated by aspiration pneumonia hypotension and required pressor support.  Compliance with diet, lifestyle and medications: Yes  Kasey is seen along with his son and has made a good recovery back to his previous level of  function and is not having edema shortness of breath chest pain palpitation or syncope.  He has an upcoming appointment with EP and device clinic Cardiogram performed as an inpatient showed no findings of prosthetic AVR dysfunction  Echo 02/21/2022: 1. Left ventricular ejection fraction, by estimation, is 60 to 65%. The  left ventricle has normal function. The left ventricle has no regional  wall motion abnormalities. There is mild concentric left ventricular  hypertrophy. Left ventricular diastolic  function could not be evaluated.   2. Right ventricular systolic function is normal. The right ventricular  size is normal. Tricuspid regurgitation signal is inadequate for assessing  PA pressure.   3. The mitral valve is normal in structure. No evidence of mitral valve  regurgitation. No evidence of mitral stenosis.   4. The aortic valve was not well visualized. There is a 23 mm Edwards  Intuity valve present in the aortic position.      Aortic valve regurgitation is not visualized. No evidence of aortic  valve stenosis. Aortic valve area, by VTI measures 2.33 cm. Aortic valve  mean gradient measures 2.0 mmHg. Aortic valve Vmax measures 0.91 m/s.   5. The inferior vena cava is dilated in size with >50% respiratory  variability, suggesting right atrial pressure of 8 mmHg.  Past Medical History:  Diagnosis Date   Full dentures    Heart murmur    History of aortic valve stenosis 2011   06/ 2011 dx mild bicuspid stenosis;    then 04/ 2019 severe  stenosis   History of COVID-19 04/23/2019  positive result in epic;  per pt asymptomatic   History of subarachnoid hemorrhage 04/27/2019   hospital admission in epic, dx bilateral frontal SAH w/ left occipital skull fx nondisplaced due to syncope w/ fall secondary to dehydration/ ortho hypotension   History of supraventricular tachycardia 12/2017   followed by cardiology   Hyperlipidemia    Hypertension    followed by cardiology and pcp    Hypothyroidism    followed by pcp   Left bundle branch block (LBBB) 12/18/2017   post op AV replacement 12-17-2017   Lower urinary tract symptoms (LUTS)    Macular edema of right eye    Dr  Sherlynn Stalls    OA (osteoarthritis)    multiple sites , followed by dr s. Estanislado Pandy   Prostate cancer [facility])    urologist--- dr Jeffie Pollock--- dx 04/ 2022, Gleason 3+4, PSA 3.76   Rheumatoid arthritis ([place]) 11/17/2018   rheumotologist-- dr s. 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 12/17/2017   Edwards Intuity Elite rapid deployment stented bovine pericardial tissue valve via right mini thoracotomy approach   SOB (shortness of breath)    per pt occasionally with yard work, but ok with stairs and normal activities   Spinal stenosis of lumbar region 04/23/2018   Wide-complex tachycardia 12/30/2017   (followed by dr Bettina Gavia)  post cardioversion by EMS 12-30-2017 for SVT 170s post op AV replacement 12-17-2017 , CHF, AKI    Past Surgical History:  Procedure Laterality Date   AORTIC VALVE REPLACEMENT N/A 12/17/2017   Procedure: MINIMALLY INVASIVE AORTIC VALVE REPLACEMENT (AVR) [ using Edwards Intuity Valve Size 60m;  Surgeon: ORexene Alberts MD;  [facility] [facility]  Service: Open Heart Surgery;  Laterality: N/A;  MINI THORACOTOMY   CARDIAC CATHETERIZATION  11/10/2000   '@[facility]'$  by dr kCaryl Comes  normal coronary angiography and normal LVF   CATARACT EXTRACTION W/ INTRAOCULAR LENS IMPLANT Right 2018   COLONOSCOPY     last one 02-29-2020 by dr h. danis   CYSTOSCOPY N/A 02/09/2021   Procedure: CConsuela Mimes  Surgeon: WIrine Seal MD;  [facility]  Service: Urology;  Laterality: N/A;   INGUINAL HERNIA REPAIR Right 2008   LAPAROSCOPIC CHOLECYSTECTOMY  06/26/2001   '@[facility]'$ ;  WITH OPEN LEFT INGUINAL HERNIA REPAIR   LUMBAR LAMINECTOMY/DECOMPRESSION MICRODISCECTOMY N/A 04/23/2018   Procedure: Microlumbar decompression Lumbar five-Sacral one;   Surgeon: BSusa Day MD;  Location: [facility]  Service: Orthopedics;  Laterality: N/A;   PACEMAKER IMPLANT N/A 01/23/2022   Procedure: PACEMAKER IMPLANT;  Surgeon: KDeboraha Sprang MD;  [facility];  Service: Cardiovascular;  Laterality: N/A;   PROSTATE BIOPSY  09/20/2020   RADIOACTIVE SEED IMPLANT N/A 02/09/2021   Procedure: RADIOACTIVE SEED IMPLANT/BRACHYTHERAPY IMPLANT;  Surgeon: WIrine Seal MD;  [facility]  Service: Urology;  Laterality: N/A;  56 seeds   RIGHT/LEFT HEART CATH AND CORONARY ANGIOGRAPHY N/A 10/16/2017   Procedure: RIGHT/LEFT HEART CATH AND CORONARY ANGIOGRAPHY;  Surgeon: CSherren Mocha MD;  [facility];  Service: Cardiovascular;  Laterality: N/A;   SPACE OAR INSTILLATION N/A 02/09/2021   Procedure: SPACE OAR INSTILLATION;  Surgeon: WIrine Seal MD;  [facility]  Service: Urology;  Laterality: N/A;   TEE WITHOUT CARDIOVERSION N/A 12/17/2017   Procedure: TRANSESOPHAGEAL ECHOCARDIOGRAM (TEE);  Surgeon: ORexene Alberts MD;  [facility]  Service: Open Heart Surgery;  Laterality: N/A;   TEMPORARY PACEMAKER N/A 01/17/2022  Procedure: TEMPORARY PACEMAKER;  Surgeon: Belva Crome, MD;  Location: [place] [facility];  Service: Cardiovascular;  Laterality: N/A;   TONSILLECTOMY  1960    Current Medications: Current Meds  Medication Sig   acetaminophen (TYLENOL) 500 MG tablet Take 500 mg by mouth every 6 (six) hours as needed for mild pain or headache.   aspirin EC 81 MG tablet Take 81 mg by mouth daily.   carvedilol (COREG) 12.5 MG tablet Take 1 tablet (12.5 mg total) by mouth 2 (two) times daily with a meal.   Cholecalciferol (VITAMIN D3) 50 MCG (2000 UT) TABS Take 2,000 Units by mouth daily.   levothyroxine (SYNTHROID) 88 MCG tablet Take 1 tablet (88 mcg total) by mouth daily before breakfast.   methotrexate (RHEUMATREX) 2.5 MG tablet TAKE 4 TABLETS (10 MG TOTAL) BY MOUTH ONCE A WEEK    Multiple Vitamins-Iron (MULTIVITAMINS WITH IRON) TABS tablet Take 1 tablet by mouth daily.   rosuvastatin (CRESTOR) 10 MG tablet Take 1 tablet (10 mg total) by mouth at bedtime.   [DISCONTINUED] amLODipine (NORVASC) 5 MG tablet Take 1 tablet (5 mg total) by mouth daily.   [DISCONTINUED] aspirin EC 81 MG tablet Take 81 mg by mouth daily. Swallow whole.   [DISCONTINUED] carvedilol (COREG) 12.5 MG tablet Take 12.5 mg by mouth 2 (two) times daily with a meal.   [DISCONTINUED] Cholecalciferol (VITAMIN D-3 PO) Take 2,000 Units by mouth daily.   [DISCONTINUED] folic acid (FOLVITE) 1 MG tablet TAKE 1 TABLET BY MOUTH EVERY DAY (Patient taking differently: Take 1 mg by mouth daily.)   [DISCONTINUED] folic acid (FOLVITE) 1 MG tablet Take 1 mg by mouth daily.   [DISCONTINUED] levothyroxine (SYNTHROID) 88 MCG tablet Take 88 mcg by mouth daily before breakfast.   [DISCONTINUED] methotrexate (RHEUMATREX) 2.5 MG tablet Take 10 mg by mouth every Friday.   [DISCONTINUED] Multiple Vitamin (MULTIVITAMIN) TABS Take 1 tablet by mouth daily with breakfast.   [DISCONTINUED] rosuvastatin (CRESTOR) 10 MG tablet Take 10 mg by mouth at bedtime.   [DISCONTINUED] spironolactone (ALDACTONE) 25 MG tablet Take 1 tablet (25 mg total) by mouth daily.     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    Comment: 05/2008  Tobacco Use   Smoking status: Not on file    Passive exposure: Never   Smokeless tobacco: Never  Vaping Use   Vaping Use: Never used  Substance and Sexual Activity   Alcohol use: Not Currently    Comment: no ETOH since the 90s   Drug use: Never   Sexual activity: Not Currently  Other Topics Concern    Not on file  Social History Narrative   ** Merged History Encounter **       Lives w/ wife 3 children, 8 g-kids        Social Determinants of Health   Financial Resource Strain: Low Risk  (07/09/2021)   Overall Financial Resource Strain (CARDIA)    Difficulty of Paying Living Expenses: Not hard at all  Food Insecurity: No Food Insecurity (07/09/2021)   Hunger Vital Sign    Worried About Running Out of Food in the Last Year: Never true  Ran Out of Food in the Last Year: Never true  Transportation Needs: No Transportation Needs (07/09/2021)   PRAPARE - Hydrologist (Medical): No    Lack of Transportation (Non-Medical): No  Physical Activity: Inactive (07/09/2021)   Exercise Vital Sign    Days of Exercise per Week: 0 days    Minutes of Exercise per Session: 0 min  Stress: No Stress Concern Present (07/09/2021)   [place]    Feeling of Stress : Not at all  Social Connections: Moderately Integrated (07/09/2021)   Social Connection and Isolation Panel [NHANES]    Frequency of Communication with Friends and Family: More than three times a week    Frequency of Social Gatherings with Friends and Family: Three times a week    Attends Religious Services: More than 4 times per year    Active Member of Clubs or Organizations: No    Attends Archivist Meetings: Never    Marital Status: Married     Family History: The patient's family history includes COPD in his father; Coronary artery disease in his brother; Diabetes in his brother, daughter, and mother; Emphysema in his father; Stroke in his mother. There is no history of Colon cancer, Prostate cancer, Colon polyps, Esophageal cancer, Rectal cancer, or Stomach cancer. ROS:   Please see the history of present illness.    All other systems reviewed and are negative.  EKGs/Labs/Other Studies Reviewed:    The following studies were  reviewed today:    Recent Labs: 01/17/2022: TSH 3.615 01/20/2022: ALT 45 01/24/2022: BUN 15; Creatinine, Ser 1.00; Hemoglobin 11.6; Magnesium 1.8; Platelets 183; Potassium 3.8; Sodium 135  Recent Lipid Panel    Component Value Date/Time   CHOL 149 06/26/2021 0829   CHOL 172 02/24/2018 1150   TRIG 157 (H) 01/21/2022 0625   TRIG 115 05/05/2006 0913   HDL 48.80 06/26/2021 0829   HDL 61 02/24/2018 1150   CHOLHDL 3 06/26/2021 0829   VLDL 31.4 06/26/2021 0829   LDLCALC 69 06/26/2021 0829   LDLCALC 110 (H) 02/22/2020 1110   LDLDIRECT 76.5 04/07/2008 1057    Physical Exam:    VS:  BP (!) 140/70 (BP Location: Left Arm, Patient Position: Sitting)   Pulse 67   Ht '5\' 8"'$  (1.727 m)   Wt 183 lb 12.8 oz (83.4 kg)   SpO2 97%   BMI 27.95 kg/m     Wt Readings from Last 3 Encounters:  02/21/22 183 lb 12.8 oz (83.4 kg)  01/25/22 190 lb 7.6 oz (86.4 kg)  12/24/21 194 lb (88 kg)     GEN:  Well nourished, well developed in no acute distress HEENT: Normal NECK: No JVD; No carotid bruits LYMPHATICS: No lymphadenopathy CARDIAC: RRR, no murmurs, rubs, gallops RESPIRATORY:  Clear to auscultation without rales, wheezing or rhonchi  ABDOMEN: Soft, non-tender, non-distended MUSCULOSKELETAL:  No edema; No deformity  SKIN: Warm and dry NEUROLOGIC:  Alert and oriented x 3 PSYCHIATRIC:  Normal affect    Signed, Shirlee More, MD  02/21/2022 2:42 PM    [facility]

## 2022-02-21 ENCOUNTER — Ambulatory Visit: Payer: Medicare Other | Attending: Cardiology | Admitting: Cardiology

## 2022-02-21 ENCOUNTER — Encounter: Payer: Self-pay | Admitting: Cardiology

## 2022-02-21 VITALS — BP 140/70 | HR 67 | Ht 68.0 in | Wt 183.8 lb

## 2022-02-21 DIAGNOSIS — Z953 Presence of xenogenic heart valve: Secondary | ICD-10-CM

## 2022-02-21 DIAGNOSIS — Z950 Presence of cardiac pacemaker: Secondary | ICD-10-CM

## 2022-02-21 DIAGNOSIS — E785 Hyperlipidemia, unspecified: Secondary | ICD-10-CM

## 2022-02-21 DIAGNOSIS — I10 Essential (primary) hypertension: Secondary | ICD-10-CM

## 2022-02-21 DIAGNOSIS — I442 Atrioventricular block, complete: Secondary | ICD-10-CM

## 2022-02-21 DIAGNOSIS — I447 Left bundle-branch block, unspecified: Secondary | ICD-10-CM | POA: Diagnosis not present

## 2022-02-21 NOTE — Patient Instructions (Signed)
Medication Instructions:  Your physician recommends that you continue on your current medications as directed. Please refer to the Current Medication list given to you today.  *If you need a refill on your cardiac medications before your next appointment, please call your pharmacy*   Lab Work: None If you have labs (blood work) drawn today and your tests are completely normal, you will receive your results only by: MyChart Message (if you have MyChart) OR A paper copy in the mail If you have any lab test that is abnormal or we need to change your treatment, we will call you to review the results.   Testing/Procedures: None   Follow-Up: At [facility], you and your health needs are our priority.  As part of our continuing mission to provide you with exceptional heart care, we have created designated Provider Care Teams.  These Care Teams include your primary Cardiologist (physician) and Advanced Practice Providers (APPs -  Physician Assistants and Nurse Practitioners) who all work together to provide you with the care you need, when you need it.  We recommend signing up for the patient portal called "MyChart".  Sign up information is provided on this After Visit Summary.  MyChart is used to connect with patients for Virtual Visits (Telemedicine).  Patients are able to view lab/test results, encounter notes, upcoming appointments, etc.  Non-urgent messages can be sent to your provider as well.   To learn more about what you can do with MyChart, go to [email or web address].    Your next appointment:   6 month(s)  The format for your next appointment:   In Person  Provider:   Brian Munley, MD    Other Instructions None  Important Information About Sugar       

## 2022-02-22 ENCOUNTER — Other Ambulatory Visit: Payer: Self-pay | Admitting: *Deleted

## 2022-02-22 ENCOUNTER — Other Ambulatory Visit: Payer: Self-pay | Admitting: Rheumatology

## 2022-02-22 DIAGNOSIS — Z79899 Other long term (current) drug therapy: Secondary | ICD-10-CM

## 2022-02-23 LAB — CBC WITH DIFFERENTIAL/PLATELET
Absolute Monocytes: 682 cells/uL (ref 200–950)
Basophils Absolute: 10 cells/uL (ref 0–200)
Basophils Relative: 0.2 %
Eosinophils Absolute: 82 cells/uL (ref 15–500)
Eosinophils Relative: 1.7 %
HCT: 35.8 % — ABNORMAL LOW (ref 38.5–50.0)
Hemoglobin: 11.7 g/dL — ABNORMAL LOW (ref 13.2–17.1)
Lymphs Abs: 946 cells/uL (ref 850–3900)
MCH: 31.8 pg (ref 27.0–33.0)
MCHC: 32.7 g/dL (ref 32.0–36.0)
MCV: 97.3 fL (ref 80.0–100.0)
MPV: 9.4 fL (ref 7.5–12.5)
Monocytes Relative: 14.2 %
Neutro Abs: 3082 cells/uL (ref 1500–7800)
Neutrophils Relative %: 64.2 %
Platelets: 230 10*3/uL (ref 140–400)
RBC: 3.68 10*6/uL — ABNORMAL LOW (ref 4.20–5.80)
RDW: 13.2 % (ref 11.0–15.0)
Total Lymphocyte: 19.7 %
WBC: 4.8 10*3/uL (ref 3.8–10.8)

## 2022-02-23 LAB — COMPLETE METABOLIC PANEL WITH GFR
AG Ratio: 1.6 (calc) (ref 1.0–2.5)
ALT: 21 U/L (ref 9–46)
AST: 29 U/L (ref 10–35)
Albumin: 4.1 g/dL (ref 3.6–5.1)
Alkaline phosphatase (APISO): 84 U/L (ref 35–144)
BUN: 14 mg/dL (ref 7–25)
CO2: 28 mmol/L (ref 20–32)
Calcium: 9.2 mg/dL (ref 8.6–10.3)
Chloride: 101 mmol/L (ref 98–110)
Creat: 1.04 mg/dL (ref 0.70–1.28)
Globulin: 2.6 g/dL (calc) (ref 1.9–3.7)
Glucose, Bld: 127 mg/dL — ABNORMAL HIGH (ref 65–99)
Potassium: 4.1 mmol/L (ref 3.5–5.3)
Sodium: 138 mmol/L (ref 135–146)
Total Bilirubin: 0.8 mg/dL (ref 0.2–1.2)
Total Protein: 6.7 g/dL (ref 6.1–8.1)
eGFR: 74 mL/min/{1.73_m2} (ref >=60)

## 2022-03-06 NOTE — Progress Notes (Unsigned)
Office Visit Note  Patient: Eric [name]             Date of Birth: 10-05-1945           MRN: [id number]             PCP: Colon Branch, MD Referring: Colon Branch, MD Visit Date: 03/20/2022 Occupation: '@GUAROCC'$ @  Subjective:  Fatigue    History of Present Illness: Stephan Nelis is a 76 y.o. male with history of seronegative rheumatoid arthritis.  He is taking Methotrexate 4 tablets by mouth once weekly and folic acid 1 mg po daily.  He continues to tolerate methotrexate without any side effects and has not missed any doses recently.  He denies any signs or symptoms of a rheumatoid arthritis flare.  Patient reports that he was recently hospitalized and now has a permanent pacemaker.  He states that he has completed cardiac rehab and has no restrictions on his activity level at this time.  His energy level has been improving gradually.  He states he had some aching in his shoulders while hospitalized but his symptoms have completely resolved.  He denies any other joint pain or joint swelling at this time.  He has had the flu shot.  He is up-to-date with the pneumonia vaccine per patient.  He denies any recent or recurrent infections.  Activities of Daily Living:  Patient reports morning stiffness for 0 minutes.   Patient Denies nocturnal pain.  Difficulty dressing/grooming: Denies Difficulty climbing stairs: Denies Difficulty getting out of chair: Denies Difficulty using hands for taps, buttons, cutlery, and/or writing: Denies  Review of Systems  Constitutional:  Negative for fatigue.  HENT:  Negative for mouth sores and mouth dryness.   Eyes:  Negative for dryness.  Respiratory:  Negative for shortness of breath.   Cardiovascular:  Negative for chest pain and palpitations.  Gastrointestinal:  Negative for blood in stool, constipation and diarrhea.  Endocrine: Negative for increased urination.  Genitourinary:  Negative for involuntary urination.  Musculoskeletal:  Positive for joint  pain and joint pain. Negative for gait problem, joint swelling, myalgias, muscle weakness, morning stiffness, muscle tenderness and myalgias.  Skin:  Negative for color change, rash, hair loss and sensitivity to sunlight.  Allergic/Immunologic: Negative for susceptible to infections.  Neurological:  Positive for light-headedness. Negative for dizziness and headaches.  Hematological:  Positive for bruising/bleeding tendency. Negative for swollen glands.  Psychiatric/Behavioral:  Negative for depressed mood and sleep disturbance. The patient is not nervous/anxious.     PMFS History:  Patient Active Problem List   Diagnosis Date Noted   Complete heart block ([place]) 01/17/2022   Heart block AV complete ([place])    Cardiac arrest ([place])    Cardiogenic shock ([facility])    Malignant neoplasm of prostate ([place]) 10/24/2020   Lumbar stenosis    Heart murmur    GERD (gastroesophageal reflux disease)    Dyspnea    Cataract    Arthritis    Anxiety    Body mass index (BMI) 30.0-30.9, adult 06/23/2019   Subdural hematoma ([place]) 05/05/2019   Subarachnoid hemorrhage ([place]) 04/28/2019   History of COVID-19 04/23/2019   Rheumatoid arthritis ([place]) 11/17/2018   Spinal stenosis of lumbar region 04/23/2018   HNP (herniated nucleus pulposus), lumbar 04/23/2018   Prolapsed lumbar disc 03/03/2018   Wide-complex tachycardia 12/30/2017   Left bundle branch block (LBBB) 12/18/2017   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 12/17/2017   Lower urinary tract symptoms (LUTS) 12/2017  Abnormal LFTs    Macular edema    PCP NOTES >>>>>> 04/27/2015   Hyperglycemia 10/20/2014   Dizziness 04/21/2014   Annual physical exam 04/15/2011   Aortic stenosis 04/15/2011   History of aortic valve stenosis 2011   Hypothyroidism 10/13/2007   Hyperlipidemia 10/13/2007   Hypertension 04/08/2007    Past Medical History:  Diagnosis Date   Full dentures    Heart murmur    History of aortic valve  stenosis 2011   06/ 2011 dx mild bicuspid stenosis;    then 04/ 2019 severe  stenosis   History of COVID-19 04/23/2019   positive result in epic;  per pt asymptomatic   History of subarachnoid hemorrhage 04/27/2019   hospital admission in epic, dx bilateral frontal SAH w/ left occipital skull fx nondisplaced due to syncope w/ fall secondary to dehydration/ ortho hypotension   History of supraventricular tachycardia 12/2017   followed by cardiology   Hyperlipidemia    Hypertension    followed by cardiology and pcp   Hypothyroidism    followed by pcp   Left bundle branch block (LBBB) 12/18/2017   post op AV replacement 12-17-2017   Lower urinary tract symptoms (LUTS)    Macular edema of right eye    Dr  Sherlynn Stalls    OA (osteoarthritis)    multiple sites , followed by dr s. Estanislado Pandy   Prostate cancer [facility])    urologist--- dr Jeffie Pollock--- dx 04/ 2022, Gleason 3+4, PSA 3.76   Rheumatoid arthritis ([place]) 11/17/2018   rheumotologist-- dr s. 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   Coronary artery disease Brother        Male 1st degree relative >50, had CABG in his 92s   Emphysema Father    COPD Father    Diabetes Daughter    Colon cancer Neg Hx    Prostate cancer Neg Hx    Colon polyps Neg Hx    Esophageal cancer Neg Hx    Rectal cancer Neg Hx    Stomach cancer Neg  Hx    Past Surgical History:  Procedure Laterality Date   AORTIC VALVE REPLACEMENT N/A 12/17/2017   Procedure: MINIMALLY INVASIVE AORTIC VALVE REPLACEMENT (AVR) [ using Edwards Intuity Valve Size 34m;  Surgeon: ORexene Alberts MD;  Location: [facility]  Service: Open Heart Surgery;  Laterality: N/A;  MINI THORACOTOMY   CARDIAC CATHETERIZATION  11/10/2000   '@[facility]'$  by dr kCaryl Comes  normal coronary angiography and normal LVF   CATARACT EXTRACTION W/ INTRAOCULAR LENS IMPLANT Right 2018   COLONOSCOPY     last one 02-29-2020 by dr h. danis   CYSTOSCOPY N/A 02/09/2021   Procedure: CConsuela Mimes  Surgeon: WIrine Seal MD;  [facility]  Service: Urology;  Laterality: N/A;   INGUINAL HERNIA REPAIR Right 2008  LAPAROSCOPIC CHOLECYSTECTOMY  06/26/2001   '@[facility]'$ ;  WITH OPEN LEFT INGUINAL HERNIA REPAIR   LUMBAR LAMINECTOMY/DECOMPRESSION MICRODISCECTOMY N/A 04/23/2018   Procedure: Microlumbar decompression Lumbar five-Sacral one;  Surgeon: Susa Day, MD;  Location: [place];  Service: Orthopedics;  Laterality: N/A;   PACEMAKER IMPLANT N/A 01/23/2022   Procedure: PACEMAKER IMPLANT;  Surgeon: Deboraha Sprang, MD;  Location: [facility];  Service: Cardiovascular;  Laterality: N/A;   PROSTATE BIOPSY  09/20/2020   RADIOACTIVE SEED IMPLANT N/A 02/09/2021   Procedure: RADIOACTIVE SEED IMPLANT/BRACHYTHERAPY IMPLANT;  Surgeon: Irine Seal, MD;  [facility];  Service: Urology;  Laterality: N/A;  56 seeds   RIGHT/LEFT HEART CATH AND CORONARY ANGIOGRAPHY N/A 10/16/2017   Procedure: RIGHT/LEFT HEART CATH AND CORONARY ANGIOGRAPHY;  Surgeon: [name], MD;  Location: [place] [facility];  Service: Cardiovascular;  Laterality: N/A;   SPACE OAR INSTILLATION N/A 02/09/2021   Procedure: SPACE OAR INSTILLATION;  Surgeon: Irine Seal, MD;  Location[facility];  Service: Urology;  Laterality: N/A;   TEE WITHOUT CARDIOVERSION N/A 12/17/2017   Procedure:  TRANSESOPHAGEAL ECHOCARDIOGRAM (TEE);  Surgeon: Rexene Alberts, MD;  Location: [place];  Service: Open Heart Surgery;  Laterality: N/A;   TEMPORARY PACEMAKER N/A 01/17/2022   Procedure: TEMPORARY PACEMAKER;  Surgeon: Belva Crome, MD;  Location: [place] [facility];  Service: Cardiovascular;  Laterality: N/A;   TONSILLECTOMY  1960   Social History   Social History Narrative   ** Merged History Encounter **       Lives w/ wife 3 children, 8 g-kids        Immunization History  Administered Date(s) Administered   Fluad Quad(high Dose 65+) 02/12/2019, 03/14/2020, 05/15/2021, 03/14/2022   Influenza Split 03/11/2013, 02/17/2014   Influenza Whole 04/13/2007, 03/15/2008, 03/10/2012   Influenza, High Dose Seasonal PF 04/04/2015, 03/28/2016, 03/18/2017, 03/16/2018   PFIZER(Purple Top)SARS-COV-2 Vaccination 08/08/2019, 08/31/2019, 03/24/2020   PNEUMOCOCCAL CONJUGATE-20 06/26/2021   Pneumococcal Conjugate-13 04/21/2014   Pneumococcal Polysaccharide-23 04/15/2012   Td 06/10/1996, 06/10/2000   Tdap 04/15/2011, 10/11/2018   Zoster, Live 03/29/2015     Objective: Vital Signs: BP (!) 164/82 (BP Location: Left Arm, Patient Position: Sitting, Cuff Size: Normal)   Pulse 61   Resp 18   Ht '5\' 8"'$  (1.727 m)   Wt 183 lb 9.6 oz (83.3 kg)   BMI 27.92 kg/m    Physical Exam Vitals and nursing note reviewed.  Constitutional:      Appearance: He is well-developed.  HENT:     Head: Normocephalic and atraumatic.  Eyes:     Conjunctiva/sclera: Conjunctivae normal.     Pupils: Pupils are equal, round, and reactive to light.  Cardiovascular:     Rate and Rhythm: Normal rate and regular rhythm.     Heart sounds: Normal heart sounds.     Comments: Permanent pacemaker Pulmonary:     Effort: Pulmonary effort is normal.     Breath sounds: Normal breath sounds.  Abdominal:     General: Bowel sounds are normal.     Palpations: Abdomen is soft.  Musculoskeletal:     Cervical back: Normal range of  motion and neck supple.  Skin:    General: Skin is warm and dry.     Capillary Refill: Capillary refill takes less than 2 seconds.  Neurological:     Mental Status: He is alert and oriented to person, place, and time.  Psychiatric:        Behavior: Behavior normal.  Musculoskeletal Exam: C-spine, thoracic spine, and lumbar spine good ROM.  No midline spinal tenderness or SI joint tenderness upon palpation.  Shoulder joints, elbow joints, wrist joints, MCPs, PIPs, and DIPs good ROM with no synovitis.  Complete fist formation bilaterally.  PIP and DIP thickening consistent with osteoarthritis of both hands.  Hip joints, knee joints, and ankle joints have good ROM with no discomfort.  No warmth or effusion of knee joints.  No tenderness or swelling of ankle joints.    CDAI Exam: CDAI Score: -- Patient Global: 4 mm; Provider Global: 4 mm Swollen: --; Tender: -- Joint Exam 03/20/2022   No joint exam has been documented for this visit   There is currently no information documented on the homunculus. Go to the Rheumatology activity and complete the homunculus joint exam.  Investigation: No additional findings.  Imaging: No results found.  Recent Labs: Lab Results  Component Value Date   WBC 4.8 02/22/2022   HGB 11.7 (L) 02/22/2022   PLT 230 02/22/2022   NA 138 02/22/2022   K 4.1 02/22/2022   CL 101 02/22/2022   CO2 28 02/22/2022   GLUCOSE 127 (H) 02/22/2022   BUN 14 02/22/2022   CREATININE 1.04 02/22/2022   BILITOT 0.8 02/22/2022   ALKPHOS 58 01/20/2022   AST 29 02/22/2022   ALT 21 02/22/2022   PROT 6.7 02/22/2022   ALBUMIN 3.1 (L) 01/20/2022   CALCIUM 9.2 02/22/2022   GFRAA 71 11/13/2020   QFTBGOLDPLUS NEGATIVE 08/24/2018    Speciality Comments: No specialty comments available.  Procedures:  No procedures performed Allergies: Atorvastatin, Ezetimibe, Lipitor [atorvastatin], and Zetia [ezetimibe]   Assessment / Plan:     Visit Diagnoses: Seronegative  rheumatoid arthritis ([place]): He has no synovitis on examination today.  He has not had any signs or symptoms of a rheumatoid arthritis flare.  He has been taking methotrexate 4 tablets by mouth once weekly and folic acid 1 mg daily.  He is tolerating methotrexate without any side effects and has not had any recent or recurrent infections.  He has not been experiencing any nocturnal pain or difficulty with ADLs.  He tries to remain active walking for exercise.  He will remain on methotrexate as monotherapy.  He was advised to notify us if he develops increased joint pain or joint swelling.  He will follow-up in the office in 5 months or sooner if needed.  High risk medication use - Methotrexate 4 tablets by mouth once weekly and folic acid 1 mg po daily.  CBC and CMP updated on 02/22/22.  Reviewed results with the patient today in the office.  His next lab work will be due in December and every 3 months to monitor for drug toxicity.  Standing orders for CBC and CMP remain in place. He has not had any recent or recurrent infections.  Discussed the importance of holding methotrexate if he develops signs or symptoms of an infection and to resume once the infection is completely cleared. He received the annual flu shot.  He is up-to-date with the pneumonia vaccine per patient.  Primary osteoarthritis of both hands: He has PIP and DIP thickening consistent with osteoarthritis of both hands.  Some CMC joint prominence noted bilaterally.  Complete fist formation bilaterally.  No synovitis noted.  Discussed the importance of joint protection and muscle strengthening.  Primary osteoarthritis of both knees: He has good range of motion of both knee joints on examination today.  No warmth or effusion noted.  Primary osteoarthritis  of both feet: Both ankle joints have good range of motion with no tenderness or synovitis.  No tenderness over MTP joints.  Patient is wearing proper fitting shoes.  Spinal stenosis of  lumbar region, unspecified whether neurogenic claudication present: He is not experiencing any increased discomfort in his lower back at this time.  No symptoms of radiculopathy.  Osteopenia of multiple sites - DEXA updated on 04/23/2019 right femoral neck BMD 0.776 with T score -1.1.  Patient is due to update DEXA.  Order will be placed today.  Other medical conditions are listed as follows:  Essential hypertension: Blood pressure was 164/82 in the office.  He did not take his blood pressure medication before coming to his appointment today.  He has been checking his blood pressure on a daily basis at home and it has been well controlled.  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    Follow-Up Instructions: Return in about 5 months (around 08/19/2022) for Rheumatoid arthritis.   Ofilia Neas, PA-C  Note - This record has been created using Dragon software.  Chart creation errors have been sought, but may not always  have been located. Such creation errors do not reflect on  the standard of medical care.

## 2022-03-14 ENCOUNTER — Ambulatory Visit (INDEPENDENT_AMBULATORY_CARE_PROVIDER_SITE_OTHER): Payer: Medicare Other

## 2022-03-14 DIAGNOSIS — Z23 Encounter for immunization: Secondary | ICD-10-CM | POA: Diagnosis not present

## 2022-03-14 NOTE — Progress Notes (Signed)
Pt came in for HD Flu shot today and received it in the right arm. Pt tolerated well.

## 2022-03-20 ENCOUNTER — Ambulatory Visit: Payer: Medicare Other | Attending: Physician Assistant | Admitting: Physician Assistant

## 2022-03-20 ENCOUNTER — Encounter: Payer: Self-pay | Admitting: Physician Assistant

## 2022-03-20 VITALS — BP 164/82 | HR 61 | Resp 18 | Ht 68.0 in | Wt 183.6 lb

## 2022-03-20 DIAGNOSIS — Z79899 Other long term (current) drug therapy: Secondary | ICD-10-CM | POA: Diagnosis not present

## 2022-03-20 DIAGNOSIS — M19071 Primary osteoarthritis, right ankle and foot: Secondary | ICD-10-CM

## 2022-03-20 DIAGNOSIS — C61 Malignant neoplasm of prostate: Secondary | ICD-10-CM

## 2022-03-20 DIAGNOSIS — I10 Essential (primary) hypertension: Secondary | ICD-10-CM

## 2022-03-20 DIAGNOSIS — M19072 Primary osteoarthritis, left ankle and foot: Secondary | ICD-10-CM

## 2022-03-20 DIAGNOSIS — M170 Bilateral primary osteoarthritis of knee: Secondary | ICD-10-CM

## 2022-03-20 DIAGNOSIS — Z8659 Personal history of other mental and behavioral disorders: Secondary | ICD-10-CM

## 2022-03-20 DIAGNOSIS — M19041 Primary osteoarthritis, right hand: Secondary | ICD-10-CM

## 2022-03-20 DIAGNOSIS — M8589 Other specified disorders of bone density and structure, multiple sites: Secondary | ICD-10-CM

## 2022-03-20 DIAGNOSIS — Z953 Presence of xenogenic heart valve: Secondary | ICD-10-CM

## 2022-03-20 DIAGNOSIS — M0600 Rheumatoid arthritis without rheumatoid factor, unspecified site: Secondary | ICD-10-CM | POA: Diagnosis not present

## 2022-03-20 DIAGNOSIS — Z840 Family history of diseases of the skin and subcutaneous tissue: Secondary | ICD-10-CM

## 2022-03-20 DIAGNOSIS — M19042 Primary osteoarthritis, left hand: Secondary | ICD-10-CM

## 2022-03-20 DIAGNOSIS — Z8639 Personal history of other endocrine, nutritional and metabolic disease: Secondary | ICD-10-CM

## 2022-03-20 DIAGNOSIS — M48061 Spinal stenosis, lumbar region without neurogenic claudication: Secondary | ICD-10-CM

## 2022-03-20 DIAGNOSIS — I447 Left bundle-branch block, unspecified: Secondary | ICD-10-CM

## 2022-03-20 NOTE — Patient Instructions (Signed)
Standing Labs We placed an order today for your standing lab work.   Please have your standing labs drawn in December and every 3 months   Please have your labs drawn 2 weeks prior to your appointment so that the provider can discuss your lab results at your appointment.  Please note that you may see your imaging and lab results in [place] before we have reviewed them. We will contact you once all results are reviewed. Please allow our office up to 72 hours to thoroughly review all of the results before contacting the office for clarification of your results.  Lab hours are: Monday through Thursday from 1:30 pm-4:30 pm and Friday from 1:30 pm- 4:00 pm  You may experience shorter wait times on Monday, Thursday or Friday afternoons,.   Effective April 08, 2022, new lab hours will be: Monday through Thursday from 8:00 am -12:30 pm and 1:00 pm-5:00 pm and Friday from 8:00 am-12:00 pm.  Please be advised, all patients with office appointments requiring lab work will take precedent over walk-in lab work.   Labs are drawn by [facility]. Please bring your co-pay at the time of your lab draw.  You may receive a bill from [facility] for your lab work.  Please note if you are on Hydroxychloroquine and and an order has been placed for a Hydroxychloroquine level, you will need to have it drawn 4 hours or more after your last dose.  If you wish to have your labs drawn at another location, please call the office 24 hours in advance so we can fax the orders.  The office is located at [address] [place], [place], [place] [address] No appointment is necessary.    If you have any questions regarding directions or hours of operation,  please call [phone].   As a reminder, please drink plenty of water prior to coming for your lab work. Thanks!  If you have signs or symptoms of an infection or start antibiotics: First, call your PCP for workup of your infection. Hold your medication through the  infection, until you complete your antibiotics, and until symptoms resolve if you take the following: Injectable medication (Actemra, Benlysta, Cimzia, Cosentyx, Enbrel, Humira, Kevzara, Orencia, Remicade, Simponi, Stelara, Taltz, Tremfya) Methotrexate Leflunomide (Arava) Mycophenolate (Cellcept) Morrie Sheldon, Olumiant, or [name]   Vaccines You are taking a medication(s) that can suppress your immune system.  The following immunizations are recommended: Flu annually Covid-19  Td/Tdap (tetanus, diphtheria, pertussis) every 10 years Pneumonia (Prevnar 15 then Pneumovax 23 at least 1 year apart.  Alternatively, can take Prevnar 20 without needing additional dose) Shingrix: 2 doses from 4 weeks to 6 months apart  Please check with your PCP to make sure you are up to date.

## 2022-04-09 ENCOUNTER — Telehealth: Payer: Self-pay | Admitting: *Deleted

## 2022-04-09 NOTE — Telephone Encounter (Addendum)
Received DEXA results from [facility].  Date of Scan: 04/03/2022  Lowest T-score:-1.0  BMD:0.744  Lowest site measured:Left Femoral Neck  DX: WNL  Significant changes in BMD and site measured (5% and above):n/a  Current Regimen:Vitamin D  Recommendation: Repeat in 5 years  Reviewed by:Dr. [name]   Next Appointment:  08/19/2022  Patient advised of results an recommendations.

## 2022-04-25 ENCOUNTER — Ambulatory Visit (INDEPENDENT_AMBULATORY_CARE_PROVIDER_SITE_OTHER): Payer: Medicare Other

## 2022-04-25 DIAGNOSIS — I442 Atrioventricular block, complete: Secondary | ICD-10-CM | POA: Diagnosis not present

## 2022-04-25 LAB — CUP PACEART REMOTE DEVICE CHECK
Battery Remaining Longevity: 95 mo
Battery Remaining Percentage: 95.5 %
Battery Voltage: 3.04 V
Brady Statistic AP VP Percent: 1 %
Brady Statistic AP VS Percent: 23 %
Brady Statistic AS VP Percent: 1 %
Brady Statistic AS VS Percent: 77 %
Brady Statistic RA Percent Paced: 22 %
Brady Statistic RV Percent Paced: 1 %
Date Time Interrogation Session: 20231116020018
Implantable Lead Connection Status: 753985
Implantable Lead Connection Status: 753985
Implantable Lead Implant Date: 20230816
Implantable Lead Implant Date: 20230816
Implantable Lead Location: 753859
Implantable Lead Location: 753860
Implantable Lead Model: 3830
Implantable Pulse Generator Implant Date: 20230816
Lead Channel Impedance Value: 460 Ohm
Lead Channel Impedance Value: 610 Ohm
Lead Channel Pacing Threshold Amplitude: 0.5 V
Lead Channel Pacing Threshold Amplitude: 0.75 V
Lead Channel Pacing Threshold Pulse Width: 0.5 ms
Lead Channel Pacing Threshold Pulse Width: 0.5 ms
Lead Channel Sensing Intrinsic Amplitude: 1.2 mV
Lead Channel Sensing Intrinsic Amplitude: 12 mV
Lead Channel Setting Pacing Amplitude: 3.5 V
Lead Channel Setting Pacing Amplitude: 3.5 V
Lead Channel Setting Pacing Pulse Width: 0.5 ms
Lead Channel Setting Sensing Sensitivity: 2 mV
Pulse Gen Model: 2272
Pulse Gen Serial Number: 8101539

## 2022-04-27 ENCOUNTER — Other Ambulatory Visit: Payer: Self-pay | Admitting: Physician Assistant

## 2022-04-29 NOTE — Telephone Encounter (Signed)
Next Visit: 08/19/2022  Last Visit: 03/20/2022  Last Fill: 02/12/2022  DX: Seronegative rheumatoid arthritis   Current Dose per office note on 03/20/2022: Methotrexate 4 tablets by mouth once weekly   Labs: 02/22/2022 Glucose is 127. Rest of CMP WNL. RBC count, hgb, and hct are low but stable. Please clarify if he has had a workup for anemia in the past. Rest of CBC WNL.  We will continue to monitor lab work closely.     Okay to refill methotrexate?

## 2022-04-30 ENCOUNTER — Encounter: Payer: Medicare Other | Admitting: Internal Medicine

## 2022-04-30 DIAGNOSIS — I442 Atrioventricular block, complete: Secondary | ICD-10-CM

## 2022-04-30 DIAGNOSIS — Z950 Presence of cardiac pacemaker: Secondary | ICD-10-CM

## 2022-05-15 NOTE — Progress Notes (Signed)
Remote pacemaker transmission.   

## 2022-05-20 ENCOUNTER — Other Ambulatory Visit: Payer: Self-pay | Admitting: Cardiology

## 2022-05-20 NOTE — Telephone Encounter (Signed)
Rx refill sent to pharmacy. 

## 2022-05-23 ENCOUNTER — Other Ambulatory Visit: Payer: Self-pay | Admitting: *Deleted

## 2022-05-23 DIAGNOSIS — Z79899 Other long term (current) drug therapy: Secondary | ICD-10-CM

## 2022-05-24 LAB — CBC WITH DIFFERENTIAL/PLATELET
Absolute Monocytes: 645 cells/uL (ref 200–950)
Basophils Absolute: 10 cells/uL (ref 0–200)
Basophils Relative: 0.2 %
Eosinophils Absolute: 50 cells/uL (ref 15–500)
Eosinophils Relative: 1 %
HCT: 42.8 % (ref 38.5–50.0)
Hemoglobin: 14.3 g/dL (ref 13.2–17.1)
Lymphs Abs: 1220 cells/uL (ref 850–3900)
MCH: 30.9 pg (ref 27.0–33.0)
MCHC: 33.4 g/dL (ref 32.0–36.0)
MCV: 92.4 fL (ref 80.0–100.0)
MPV: 9.9 fL (ref 7.5–12.5)
Monocytes Relative: 12.9 %
Neutro Abs: 3075 cells/uL (ref 1500–7800)
Neutrophils Relative %: 61.5 %
Platelets: 199 10*3/uL (ref 140–400)
RBC: 4.63 10*6/uL (ref 4.20–5.80)
RDW: 14.4 % (ref 11.0–15.0)
Total Lymphocyte: 24.4 %
WBC: 5 10*3/uL (ref 3.8–10.8)

## 2022-05-24 LAB — COMPLETE METABOLIC PANEL WITH GFR
AG Ratio: 1.7 (calc) (ref 1.0–2.5)
ALT: 16 U/L (ref 9–46)
AST: 25 U/L (ref 10–35)
Albumin: 4.5 g/dL (ref 3.6–5.1)
Alkaline phosphatase (APISO): 73 U/L (ref 35–144)
BUN/Creatinine Ratio: 9 (calc) (ref 6–22)
BUN: 12 mg/dL (ref 7–25)
CO2: 28 mmol/L (ref 20–32)
Calcium: 9.6 mg/dL (ref 8.6–10.3)
Chloride: 104 mmol/L (ref 98–110)
Creat: 1.31 mg/dL — ABNORMAL HIGH (ref 0.70–1.28)
Globulin: 2.7 g/dL (calc) (ref 1.9–3.7)
Glucose, Bld: 92 mg/dL (ref 65–99)
Potassium: 4.4 mmol/L (ref 3.5–5.3)
Sodium: 141 mmol/L (ref 135–146)
Total Bilirubin: 0.8 mg/dL (ref 0.2–1.2)
Total Protein: 7.2 g/dL (ref 6.1–8.1)
eGFR: 56 mL/min/{1.73_m2} — ABNORMAL LOW (ref >=60)

## 2022-05-24 NOTE — Progress Notes (Signed)
Creatinine is elevated at 1.31.  Patient has been taking methotrexate 4 tablets p.o. weekly.  His last note indicates that he has not had any arthritis flare.  I would recommend decreasing the dose of methotrexate to 3 tablets p.o. weekly.  Repeat BMP in 1 month.  Patient should avoid all NSAIDs.  Please forward results to his PCP.

## 2022-05-28 ENCOUNTER — Other Ambulatory Visit: Payer: Self-pay | Admitting: *Deleted

## 2022-05-28 DIAGNOSIS — Z950 Presence of cardiac pacemaker: Secondary | ICD-10-CM

## 2022-05-28 HISTORY — DX: Presence of cardiac pacemaker: Z95.0

## 2022-05-28 MED ORDER — METHOTREXATE SODIUM 2.5 MG PO TABS: 7.5000 mg | ORAL_TABLET | ORAL | 0 refills | Status: DC

## 2022-05-28 NOTE — Telephone Encounter (Signed)
-----   Message from [name], MD sent at 05/24/2022 12:29 PM EST ----- Creatinine is elevated at 1.31.  Patient has been taking methotrexate 4 tablets p.o. weekly.  His last note indicates that he has not had any arthritis flare.  I would recommend decreasing the dose of methotrexate to 3 tablets p.o. weekly.  Repeat BM P in 1 month.  Patient should avoid all NSAIDs.  Please forward results to his PCP.

## 2022-05-29 ENCOUNTER — Ambulatory Visit: Payer: Medicare Other | Attending: Internal Medicine | Admitting: Internal Medicine

## 2022-05-29 ENCOUNTER — Encounter: Payer: Self-pay | Admitting: Internal Medicine

## 2022-05-29 VITALS — BP 124/80 | HR 60 | Ht 68.0 in | Wt 186.0 lb

## 2022-05-29 DIAGNOSIS — Z950 Presence of cardiac pacemaker: Secondary | ICD-10-CM | POA: Diagnosis not present

## 2022-05-29 DIAGNOSIS — I442 Atrioventricular block, complete: Secondary | ICD-10-CM | POA: Diagnosis not present

## 2022-05-29 NOTE — Progress Notes (Signed)
Patient Care Team: Colon Branch, MD as PCP - General (Internal Medicine) Bettina Gavia Hilton Cork, MD as PCP - Cardiology (Cardiology) Sherlynn Stalls, MD as Consulting Physician (Ophthalmology) Susa Day, MD as Consulting Physician (Orthopedic Surgery) Irine Seal, MD as Attending Physician (Urology) Tyler Pita, MD as Consulting Physician (Radiation Oncology) Gardenia Phlegm, NP as Nurse Practitioner (Hematology and Oncology) Colon Branch, MD   HPI  Eric [name] is a 76 y.o. 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  There have been no palpitations, lightheadedness or syncope.     Date Cr K Hgb  12/23 1.31 4.4 14.3         DATE TEST EF   8/23 Echo   60-65 %                Records and Results Reviewed   Past Medical History:  Diagnosis Date   Full dentures    Heart murmur    History of aortic valve stenosis 2011   06/ 2011 dx mild bicuspid stenosis;    then 04/ 2019 severe  stenosis   History of COVID-19 04/23/2019   positive result in epic;  per pt asymptomatic   History of subarachnoid hemorrhage 04/27/2019   hospital admission in epic, dx bilateral frontal SAH w/ left occipital skull fx nondisplaced due to syncope w/ fall secondary to dehydration/ ortho hypotension   History of supraventricular tachycardia 12/2017   followed by cardiology   Hyperlipidemia    Hypertension    followed by cardiology and pcp   Hypothyroidism    followed by pcp   Left bundle branch block (LBBB) 12/18/2017   post op AV replacement 12-17-2017   Lower urinary tract symptoms (LUTS)    Macular edema of right eye    Dr  Sherlynn Stalls    OA (osteoarthritis)    multiple sites  , followed by dr s. Estanislado Pandy   Prostate cancer [facility])    urologist--- dr Jeffie Pollock--- dx 04/ 2022, Gleason 3+4, PSA 3.76   Rheumatoid arthritis ([place]) 11/17/2018   rheumotologist-- dr s. 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 12/17/2017   Edwards Intuity Elite rapid deployment stented bovine pericardial tissue valve via right mini thoracotomy approach   SOB (shortness of breath)    per pt occasionally with yard work, but ok with stairs and normal activities   Spinal stenosis of lumbar region 04/23/2018   Wide-complex tachycardia 12/30/2017   (followed by dr Bettina Gavia)  post cardioversion by EMS 12-30-2017 for SVT 170s post op AV replacement 12-17-2017 , CHF, AKI    Past Surgical History:  Procedure Laterality Date   AORTIC VALVE REPLACEMENT N/A 12/17/2017   Procedure: MINIMALLY INVASIVE AORTIC VALVE REPLACEMENT (AVR) [ using Edwards Intuity Valve Size 56m;  Surgeon: ORexene Alberts MD;  [facility];  Service: Open Heart Surgery;  Laterality: N/A;  MINI THORACOTOMY   CARDIAC CATHETERIZATION  11/10/2000   '@[facility]'$  by dr kCaryl Comes  normal coronary angiography and normal LVF   CATARACT EXTRACTION W/ INTRAOCULAR LENS IMPLANT Right 2018   COLONOSCOPY     last one 02-29-2020 by dr h. danis   CYSTOSCOPY N/A 02/09/2021   Procedure:  CYSTOSCOPY;  Surgeon: Irine Seal, MD;  Location: [facility];  Service: Urology;  Laterality: N/A;   INGUINAL HERNIA REPAIR Right 2008   LAPAROSCOPIC CHOLECYSTECTOMY  06/26/2001   '@[facility]'$ ;  WITH OPEN LEFT INGUINAL HERNIA REPAIR   LUMBAR LAMINECTOMY/DECOMPRESSION MICRODISCECTOMY N/A 04/23/2018   Procedure: Microlumbar decompression Lumbar five-Sacral one;  Surgeon: Susa Day, MD;  Location: [place];  Service: Orthopedics;  Laterality: N/A;   PACEMAKER IMPLANT N/A 01/23/2022   Procedure: PACEMAKER IMPLANT;  Surgeon: Deboraha Sprang, MD;  Location: [place] [facility];  Service: Cardiovascular;   Laterality: N/A;   PROSTATE BIOPSY  09/20/2020   RADIOACTIVE SEED IMPLANT N/A 02/09/2021   Procedure: RADIOACTIVE SEED IMPLANT/BRACHYTHERAPY IMPLANT;  Surgeon: Irine Seal, MD;  Location: [facility];  Service: Urology;  Laterality: N/A;  56 seeds   RIGHT/LEFT HEART CATH AND CORONARY ANGIOGRAPHY N/A 10/16/2017   Procedure: RIGHT/LEFT HEART CATH AND CORONARY ANGIOGRAPHY;  Surgeon: [name], MD;  Location: [place] [facility];  Service: Cardiovascular;  Laterality: N/A;   SPACE OAR INSTILLATION N/A 02/09/2021   Procedure: SPACE OAR INSTILLATION;  Surgeon: Irine Seal, MD;  Location: [facility];  Service: Urology;  Laterality: N/A;   TEE WITHOUT CARDIOVERSION N/A 12/17/2017   Procedure: TRANSESOPHAGEAL ECHOCARDIOGRAM (TEE);  Surgeon: Rexene Alberts, MD;  Location: [place];  Service: Open Heart Surgery;  Laterality: N/A;   TEMPORARY PACEMAKER N/A 01/17/2022   Procedure: TEMPORARY PACEMAKER;  Surgeon: Belva Crome, MD;  Location: [place] [facility];  Service: Cardiovascular;  Laterality: N/A;   TONSILLECTOMY  1960    Current Meds  Medication Sig   acetaminophen (TYLENOL) 500 MG tablet Take 500 mg by mouth every 6 (six) hours as needed for mild pain or headache.   aspirin EC 81 MG tablet Take 81 mg by mouth daily.   carvedilol (COREG) 12.5 MG tablet Take 1 tablet (12.5 mg total) by mouth 2 (two) times daily with a meal.   Cholecalciferol (VITAMIN D3) 50 MCG (2000 UT) TABS Take 2,000 Units by mouth daily.   folic acid (FOLVITE) 1 MG tablet Take 1 mg by mouth daily.   levothyroxine (SYNTHROID) 88 MCG tablet Take 1 tablet (88 mcg total) by mouth daily before breakfast.   methotrexate (RHEUMATREX) 2.5 MG tablet Take 3 tablets (7.5 mg total) by mouth once a week. Caution:Chemotherapy. Protect from light.   rosuvastatin (CRESTOR) 10 MG tablet Take 1 tablet (10 mg total) by mouth at bedtime.    Allergies  Allergen Reactions   Atorvastatin Other (See Comments)     REACTION: myalgia   Ezetimibe Other (See Comments)    REACTION: myalgia   Lipitor [Atorvastatin] Other (See Comments)    Myalgia    Zetia [Ezetimibe] Other (See Comments)    Myalgia       Review of Systems negative except from HPI and PMH  Physical Exam BP 124/80   Pulse 60   Ht '5\' 8"'$  (1.727 m)   Wt 186 lb (84.4 kg)   SpO2 97%   BMI 28.28 kg/m  Well developed and well nourished in no acute distress HENT normal E scleral and icterus clear Neck Supple JVP flat; carotids brisk and full Clear to auscultation Device pocket well healed; without hematoma or erythema.  There is no tethering Regular rate and rhythm, no murmurs gallops or rub Soft with active bowel sounds No clubbing cyanosis  Edema Alert and oriented, grossly normal motor and sensory function Skin Warm and Dry  ECG  Estimated Creatinine Clearance: 50.8 mL/min (A) (by C-G formula based on SCr of 1.31 mg/dL (H)).   Assessment and  Plan Complete heart block-intermittent  Cardiac arrest secondary to bradycardia  Pacemaker-Saint Jude  Aortic valve replacement-surgical-minimally invasive   device function is normal.  Interestingly, he paces very infrequently.  Blood pressure well-controlled, continue carvedilol   Current medicines are reviewed at length with the patient today .  The patient does not  have concerns regarding medicines.

## 2022-05-29 NOTE — Patient Instructions (Addendum)
Medication Instructions:  Your physician recommends that you continue on your current medications as directed. Please refer to the Current Medication list given to you today.  *If you need a refill on your cardiac medications before your next appointment, please call your pharmacy*   Lab Work: None ordered.  If you have labs (blood work) drawn today and your tests are completely normal, you will receive your results only by: [place] (if you have MyChart) OR A paper copy in the mail If you have any lab test that is abnormal or we need to change your treatment, we will call you to review the results.   Testing/Procedures: None ordered.    Follow-Up: At [facility], you and your health needs are our priority.  As part of our continuing mission to provide you with exceptional heart care, we have created designated Provider Care Teams.  These Care Teams include your primary Cardiologist (physician) and Advanced Practice Providers (APPs -  Physician Assistants and Nurse Practitioners) who all work together to provide you with the care you need, when you need it.  We recommend signing up for the patient portal called "MyChart".  Sign up information is provided on this After Visit Summary.  MyChart is used to connect with patients for Virtual Visits (Telemedicine).  Patients are able to view lab/test results, encounter notes, upcoming appointments, etc.  Non-urgent messages can be sent to your provider as well.   To learn more about what you can do with MyChart, go to [email or web address].    Your next appointment:   9 months with Dr Olin Pia PA  Important Information About Sugar

## 2022-06-21 LAB — PSA: PSA: 0.2

## 2022-06-27 ENCOUNTER — Ambulatory Visit (HOSPITAL_BASED_OUTPATIENT_CLINIC_OR_DEPARTMENT_OTHER)
Admission: RE | Admit: 2022-06-27 | Discharge: 2022-06-27 | Disposition: A | Discharge status: Home / Self Care | Payer: Medicare Other | Source: Ambulatory Visit | Attending: Internal Medicine | Admitting: Internal Medicine

## 2022-06-27 ENCOUNTER — Encounter: Payer: Self-pay | Admitting: Internal Medicine

## 2022-06-27 ENCOUNTER — Ambulatory Visit (INDEPENDENT_AMBULATORY_CARE_PROVIDER_SITE_OTHER): Payer: Medicare Other | Admitting: Internal Medicine

## 2022-06-27 VITALS — BP 126/62 | HR 66 | Temp 98.2°F | Resp 16 | Ht 68.0 in | Wt 187.1 lb

## 2022-06-27 DIAGNOSIS — M546 Pain in thoracic spine: Secondary | ICD-10-CM | POA: Diagnosis present

## 2022-06-27 DIAGNOSIS — E785 Hyperlipidemia, unspecified: Secondary | ICD-10-CM

## 2022-06-27 DIAGNOSIS — E559 Vitamin D deficiency, unspecified: Secondary | ICD-10-CM

## 2022-06-27 DIAGNOSIS — Z0001 Encounter for general adult medical examination with abnormal findings: Secondary | ICD-10-CM

## 2022-06-27 DIAGNOSIS — I10 Essential (primary) hypertension: Secondary | ICD-10-CM | POA: Diagnosis not present

## 2022-06-27 DIAGNOSIS — E038 Other specified hypothyroidism: Secondary | ICD-10-CM | POA: Diagnosis not present

## 2022-06-27 DIAGNOSIS — R7989 Other specified abnormal findings of blood chemistry: Secondary | ICD-10-CM

## 2022-06-27 DIAGNOSIS — Z0000 Encounter for general adult medical examination without abnormal findings: Secondary | ICD-10-CM | POA: Diagnosis not present

## 2022-06-27 LAB — BASIC METABOLIC PANEL
BUN: 12 mg/dL (ref 6–23)
CO2: 29 mEq/L (ref 19–32)
Calcium: 9.5 mg/dL (ref 8.4–10.5)
Chloride: 101 mEq/L (ref 96–112)
Creatinine, Ser: 1.17 mg/dL (ref 0.40–1.50)
GFR: 60.59 mL/min (ref >60.00)
Glucose, Bld: 101 mg/dL — ABNORMAL HIGH (ref 70–99)
Potassium: 4.3 mEq/L (ref 3.5–5.1)
Sodium: 140 mEq/L (ref 135–145)

## 2022-06-27 LAB — VITAMIN D 25 HYDROXY (VIT D DEFICIENCY, FRACTURES): VITD: 32.67 ng/mL (ref 30.00–100.00)

## 2022-06-27 LAB — LIPID PANEL
Cholesterol: 159 mg/dL (ref 0–200)
HDL: 54.4 mg/dL (ref >39.00)
LDL Cholesterol: 82 mg/dL (ref 0–99)
NonHDL: 104.76
Total CHOL/HDL Ratio: 3
Triglycerides: 115 mg/dL (ref 0.0–149.0)
VLDL: 23 mg/dL (ref 0.0–40.0)

## 2022-06-27 LAB — TSH: TSH: 1.43 u[IU]/mL (ref 0.35–5.50)

## 2022-06-27 NOTE — Patient Instructions (Addendum)
We are rechecking your kidney function and will share results with Dr. Herold Harms She recommended to decrease methotrexate to 3 tablets only Drink plenty of water Avoid any anti-inflammatories such as ibuprofen, naproxen, BC's.  Check the  blood pressure regularly BP GOAL is between 110/65 and  135/85. If it is consistently higher or lower, let me know  GO TO THE LAB : Get the blood work     [place], [place] back for a checkup in 4 months  Vaccines I recommend:  Shingrix (shingles) Covid booster RSV vaccine

## 2022-06-27 NOTE — Assessment & Plan Note (Signed)
Here for CPX HTN: BP looks very good today Hyperlipidemia: Crestor, checking labs Hypothyroidism: On Synthroid, checking labs. Cardiovascular: Admitted to the hospital 01-2022, complete heart block, cardiac arrest, s/p permanent pacemaker. Saw cardiology/electrophysiology 05/29/2022, felt to be stable. Rheumatoid Arthritis, DJD: Saw rheumatology 03/20/2022, on MTX, no symptoms related to RA.  Creatinine was noted to be elevated, they recommend to decrease MTX to 3 tablets a day. On chart review he had a CT with contrast back in August.  No recent renal imaging Plan: Check BMP, good hydration, no NSAIDs.  Further advised with results. Osteopenia: rheumatology Rx DEXA in October, WNL, next 5 years  . Vitamin D deficiency: On supplements, checking labs Thoracic pain: As described above, seems to be in a dermatomal distribution.  Shingles?.  For completeness we will get a T-spine x-ray but otherwise recommend observation.   RTC 4  months

## 2022-06-27 NOTE — Assessment & Plan Note (Signed)
-  Td 2020 -PNM 23: 2013;  Prevnar: 2015; PNM 20: 2023 -  zostavax:2016 - shingrix, RSV , COVID vax: rec, benefits d/w pt - Had a flu shot -CCS: per  patient: Cscope x 2, last  04-2006, cscope 12- 2017, C-scope 02-2020, next per GI -Dx with prostate cancer 4-/2022.  F/u tomorrow w/ urology (PSA 0.2 per [facility]) - Labs reviewed, will get a BMP, FLP, TSH vitamin D -diet and exercise: counseled  -ACP: Documents charted.

## 2022-06-27 NOTE — Progress Notes (Signed)
Subjective:    Patient ID: Eric [name], male    DOB: 03/07/1946, 77 y.o.   MRN: [id number]  DOS:  06/27/2022 Type of visit - description: CPX  Since last visit at this office, he was admitted to hospital, chart reviewed   Currently with no chest pain or difficulty breathing.  No palpitations. About 4 weeks ago developed a stinging feeling at the left thorax and abdomen in a dermatomal fashion. No rash.  Feeling is there persistently, mild, not getting better or worse.  No worse at night, no increased with movement.  Not related to eating. He also specifically denies nausea vomiting.   Review of Systems  Other than above, a 14 point review of systems is negative   Past Medical History:  Diagnosis Date   Full dentures    Heart murmur    History of aortic valve stenosis 2011   06/ 2011 dx mild bicuspid stenosis;    then 04/ 2019 severe  stenosis   History of COVID-19 04/23/2019   positive result in epic;  per pt asymptomatic   History of subarachnoid hemorrhage 04/27/2019   hospital admission in epic, dx bilateral frontal SAH w/ left occipital skull fx nondisplaced due to syncope w/ fall secondary to dehydration/ ortho hypotension   History of supraventricular tachycardia 12/2017   followed by cardiology   Hyperlipidemia    Hypertension    followed by cardiology and pcp   Hypothyroidism    followed by pcp   Left bundle branch block (LBBB) 12/18/2017   post op AV replacement 12-17-2017   Lower urinary tract symptoms (LUTS)    Macular edema of right eye    Dr  Sherlynn Stalls    OA (osteoarthritis)    multiple sites , followed by dr s. Estanislado Pandy   Prostate cancer [facility])    urologist--- dr Jeffie Pollock--- dx 04/ 2022, Gleason 3+4, PSA 3.76   Rheumatoid arthritis ([place]) 11/17/2018   rheumotologist-- dr s. 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 12/17/2017   Edwards Intuity Elite rapid deployment stented bovine  pericardial tissue valve via right mini thoracotomy approach   SOB (shortness of breath)    per pt occasionally with yard work, but ok with stairs and normal activities   Spinal stenosis of lumbar region 04/23/2018   Wide-complex tachycardia 12/30/2017   (followed by dr Bettina Gavia)  post cardioversion by EMS 12-30-2017 for SVT 170s post op AV replacement 12-17-2017 , CHF, AKI    Past Surgical History:  Procedure Laterality Date   AORTIC VALVE REPLACEMENT N/A 12/17/2017   Procedure: MINIMALLY INVASIVE AORTIC VALVE REPLACEMENT (AVR) [ using Edwards Intuity Valve Size 43m;  Surgeon: ORexene Alberts MD;  [facility];  Service: Open Heart Surgery;  Laterality: N/A;  MINI THORACOTOMY   CARDIAC CATHETERIZATION  11/10/2000   '@[facility]'$  by dr kCaryl Comes  normal coronary angiography and normal LVF   CATARACT EXTRACTION W/ INTRAOCULAR LENS IMPLANT Right 2018   COLONOSCOPY     last one 02-29-2020 by dr h. danis   CYSTOSCOPY N/A 02/09/2021   Procedure: CConsuela Mimes  Surgeon: WIrine Seal MD;  [facility]  Service: Urology;  Laterality: N/A;   INGUINAL HERNIA REPAIR Right 2008   LAPAROSCOPIC CHOLECYSTECTOMY  06/26/2001   '@[facility]'$ ;  WITH OPEN LEFT INGUINAL HERNIA REPAIR   LUMBAR LAMINECTOMY/DECOMPRESSION MICRODISCECTOMY N/A 04/23/2018   Procedure: Microlumbar decompression Lumbar five-Sacral one;  Surgeon: BSusa Day MD;  [place] [facility]  Service: Orthopedics;  Laterality: N/A;   PACEMAKER IMPLANT N/A 01/23/2022   Procedure: PACEMAKER IMPLANT;  Surgeon: Deboraha Sprang, MD;  Location: [place] [facility];  Service: Cardiovascular;  Laterality: N/A;   PROSTATE BIOPSY  09/20/2020   RADIOACTIVE SEED IMPLANT N/A 02/09/2021   Procedure: RADIOACTIVE SEED IMPLANT/BRACHYTHERAPY IMPLANT;  Surgeon: Irine Seal, MD;  Location: [facility];  Service: Urology;  Laterality: N/A;  56 seeds   RIGHT/LEFT HEART CATH AND CORONARY ANGIOGRAPHY N/A 10/16/2017   Procedure: RIGHT/LEFT HEART CATH  AND CORONARY ANGIOGRAPHY;  Surgeon: [name], MD;  Location: [place] [facility];  Service: Cardiovascular;  Laterality: N/A;   SPACE OAR INSTILLATION N/A 02/09/2021   Procedure: SPACE OAR INSTILLATION;  Surgeon: Irine Seal, MD;  Location: [facility];  Service: Urology;  Laterality: N/A;   TEE WITHOUT CARDIOVERSION N/A 12/17/2017   Procedure: TRANSESOPHAGEAL ECHOCARDIOGRAM (TEE);  Surgeon: Rexene Alberts, MD;  Location: [place];  Service: Open Heart Surgery;  Laterality: N/A;   TEMPORARY PACEMAKER N/A 01/17/2022   Procedure: TEMPORARY PACEMAKER;  Surgeon: Belva Crome, MD;  Location: [place] [facility];  Service: Cardiovascular;  Laterality: N/A;   TONSILLECTOMY  1960   Social History   Socioeconomic History   Marital status: Married    Spouse name: Not on file   Number of children: 3   Years of education: Not on file   Highest education level: Not on file  Occupational History   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    Comment: 05/2008  Tobacco Use   Smoking status: Former    Packs/day: 2.00    Years: 35.00    Total pack years: 70.00    Types: Cigarettes    Quit date: 04/15/1990    Years since quitting: 32.2    Passive exposure: Never   Smokeless tobacco: Never  Vaping Use   Vaping Use: Never used  Substance and Sexual Activity   Alcohol use: Not Currently    Comment: no ETOH since the 90s   Drug use: Never   Sexual activity: Not Currently  Other Topics Concern   Not on file  Social History Narrative   ** Merged History Encounter **       Lives w/ wife 3 children, 8 g-kids        Social Determinants of Health   Financial Resource Strain: Low Risk  (07/09/2021)   Overall Financial Resource Strain (CARDIA)    Difficulty of Paying Living Expenses: Not hard at all  Food Insecurity: No [place] (07/09/2021)   Hunger Vital Sign    Worried About Running Out of Food in the Last Year: Never true    [place] in the Last Year:  Never true  Transportation Needs: No Transportation Needs (07/09/2021)   PRAPARE - Hydrologist (Medical): No    Lack of Transportation (Non-Medical): No  Physical Activity: Inactive (07/09/2021)   Exercise Vital Sign    Days of Exercise per Week: 0 days    Minutes of Exercise per Session: 0 min  Stress: No Stress Concern Present (07/09/2021)   [place]    Feeling of Stress : Not at all  Social Connections: Moderately Integrated (07/09/2021)   Social Connection and Isolation Panel [NHANES]    Frequency of Communication with Friends and Family: More than three times a week    Frequency of Social Gatherings with Friends and Family: Three times  a week    Attends Religious Services: More than 4 times per year    Active Member of Clubs or Organizations: No    Attends Archivist Meetings: Never    Marital Status: Married  Human resources officer Violence: Not At Risk (07/09/2021)   Humiliation, Afraid, Rape, and Kick questionnaire    Fear of Current or Ex-Partner: No    Emotionally Abused: No    Physically Abused: No    Sexually Abused: No    Current Outpatient Medications  Medication Instructions   acetaminophen (TYLENOL) 500 mg, Oral, Every 6 hours PRN   aspirin EC 81 mg, Oral, Daily   carvedilol (COREG) 12.5 mg, Oral, 2 times daily with meals   folic acid (FOLVITE) 1 mg, Oral, Daily   levothyroxine (SYNTHROID) 88 mcg, Oral, Daily before breakfast   methotrexate (RHEUMATREX) 7.5 mg, Oral, Weekly, Caution:Chemotherapy. Protect from light.   rosuvastatin (CRESTOR) 10 mg, Oral, Daily at bedtime   Vitamin D3 2,000 Units, Oral, Daily       Objective:   Physical Exam Skin:         Comments: Area of perceived stinging pain.  No rash    BP 126/62   Pulse 66   Temp 98.2 F (36.8 C) (Oral)   Resp 16   Ht '5\' 8"'$  (1.727 m)   Wt 187 lb 2 oz (84.9 kg)   SpO2 97%   BMI 28.45 kg/m   General: Well developed, NAD, BMI noted Neck: No  thyromegaly  HEENT:  Normocephalic . Face symmetric, atraumatic Lungs:  CTA B Normal respiratory effort, no intercostal retractions, no accessory muscle use. Heart: RRR,  no murmur.  Abdomen:  Not distended, soft, non-tender. No rebound or rigidity.   Lower extremities: no pretibial edema bilaterally  Skin: No rash on the abdomen or chest. Neurologic:  alert & oriented X3.  Speech normal, gait appropriate for age and unassisted Strength symmetric and appropriate for age.  Psych: Cognition and judgment appear intact.  Cooperative with normal attention span and concentration.  Behavior appropriate. No anxious or depressed appearing.     Assessment    Assessment HTN Hyperlipidemia Hypothyroidism R.A. dx 2020 DJD Increase LFTs Ultrasound 2002 done for increased LFTs: Normal liver; Hep C negative 02-16-15 CV: --Aortic stenosis , dx  2012 , s/p AoVR 12/2017 --Fusiform infrarenal abdominal aortic ectasis: -- FF,  Brother has CAD dx age 28s --Patient himself has a cardiac catheterization with minimal to no disease 10/2017 Macular degeneration, cataracts  Subarachnoid hemorrhage, left orbital contusion, left occipital fracture 2020 Prostate cancer, Dx 09-2020   PLAN Here for CPX HTN: BP looks very good today Hyperlipidemia: Crestor, checking labs Hypothyroidism: On Synthroid, checking labs. Cardiovascular: Admitted to the hospital 01-2022, complete heart block, cardiac arrest, s/p permanent pacemaker. Saw cardiology/electrophysiology 05/29/2022, felt to be stable. Rheumatoid Arthritis, DJD: Saw rheumatology 03/20/2022, on MTX, no symptoms related to RA.  Creatinine was noted to be elevated, they recommend to decrease MTX to 3 tablets a day. On chart review he had a CT with contrast back in August.  No recent renal imaging Plan: Check BMP, good hydration, no NSAIDs.  Further advised with results. Osteopenia: rheumatology Rx  DEXA in October, WNL, next 5 years  . Vitamin D deficiency: On supplements, checking labs Thoracic pain: As described above, seems to be in a dermatomal distribution.  Shingles?.  For completeness we will get a T-spine x-ray but otherwise recommend observation.   RTC 4  months

## 2022-07-01 ENCOUNTER — Encounter: Payer: Self-pay | Admitting: Internal Medicine

## 2022-07-03 ENCOUNTER — Encounter: Payer: Self-pay | Admitting: Internal Medicine

## 2022-07-04 MED ORDER — ROSUVASTATIN CALCIUM 20 MG PO TABS: 20.0000 mg | ORAL_TABLET | Freq: Every day | ORAL | 1 refills | Status: DC

## 2022-07-04 NOTE — Telephone Encounter (Signed)
Send a new prescription for rosuvastatin 20 mg 1 p.o. daily.

## 2022-07-04 NOTE — Addendum Note (Signed)
Addended byDamita Dunnings D on: 07/04/2022 08:37 AM   Modules accepted: Orders

## 2022-07-04 NOTE — Telephone Encounter (Signed)
Rx sent 

## 2022-07-05 ENCOUNTER — Other Ambulatory Visit: Payer: Self-pay | Admitting: Cardiology

## 2022-07-23 ENCOUNTER — Other Ambulatory Visit: Payer: Self-pay | Admitting: Rheumatology

## 2022-07-23 NOTE — Telephone Encounter (Signed)
Next Visit: 08/19/2022  Last Visit: 03/20/2022  Last Fill: 04/30/2022  DX: Seronegative rheumatoid arthritis   Current Dose per lab note 05/23/2022: recommend decreasing the dose of methotrexate to 3 tablets p.o. weekly.  Labs: 05/23/2022 Creatinine is elevated at 1.31 Glucose is 127., 06/27/2022 BMP Glucose 101  Okay to refill MTX?

## 2022-07-25 ENCOUNTER — Ambulatory Visit (INDEPENDENT_AMBULATORY_CARE_PROVIDER_SITE_OTHER): Payer: Medicare Other

## 2022-07-25 DIAGNOSIS — I442 Atrioventricular block, complete: Secondary | ICD-10-CM

## 2022-07-26 LAB — CUP PACEART REMOTE DEVICE CHECK
Battery Remaining Longevity: 115 mo
Battery Remaining Percentage: 95.5 %
Battery Voltage: 3.04 V
Brady Statistic AP VP Percent: 1 %
Brady Statistic AP VS Percent: 54 %
Brady Statistic AS VP Percent: 1 %
Brady Statistic AS VS Percent: 46 %
Brady Statistic RA Percent Paced: 53 %
Brady Statistic RV Percent Paced: 1 %
Date Time Interrogation Session: 20240215020015
Implantable Lead Connection Status: 753985
Implantable Lead Connection Status: 753985
Implantable Lead Implant Date: 20230816
Implantable Lead Implant Date: 20230816
Implantable Lead Location: 753859
Implantable Lead Location: 753860
Implantable Lead Model: 3830
Implantable Pulse Generator Implant Date: 20230816
Lead Channel Impedance Value: 530 Ohm
Lead Channel Impedance Value: 610 Ohm
Lead Channel Pacing Threshold Amplitude: 0.5 V
Lead Channel Pacing Threshold Amplitude: 1 V
Lead Channel Pacing Threshold Pulse Width: 0.5 ms
Lead Channel Pacing Threshold Pulse Width: 0.5 ms
Lead Channel Sensing Intrinsic Amplitude: 1.2 mV
Lead Channel Sensing Intrinsic Amplitude: 12 mV
Lead Channel Setting Pacing Amplitude: 2.5 V
Lead Channel Setting Pacing Amplitude: 2.5 V
Lead Channel Setting Pacing Pulse Width: 0.5 ms
Lead Channel Setting Sensing Sensitivity: 2 mV
Pulse Gen Model: 2272
Pulse Gen Serial Number: 8101539

## 2022-07-27 ENCOUNTER — Other Ambulatory Visit: Payer: Self-pay | Admitting: Physician Assistant

## 2022-07-27 ENCOUNTER — Other Ambulatory Visit: Payer: Self-pay | Admitting: Cardiology

## 2022-07-29 NOTE — Telephone Encounter (Signed)
Refill sent to pharmacy.   

## 2022-08-06 NOTE — Progress Notes (Signed)
Office Visit Note  Patient: Eric [name]             Date of Birth: 1946/05/03           MRN: [id number]             PCP: Colon Branch, MD Referring: Colon Branch, MD Visit Date: 08/19/2022 Occupation: '@GUAROCC'$ @  Subjective:  Medication monitoring   History of Present Illness: Eric [name] is a 77 y.o. male with history of seronegative rheumatoid arthritis and osteoarthritis.  Patient is taking methotrexate 4 tablets by mouth once weekly and folic acid 1 mg daily.  He is tolerating methotrexate without any side effects and has not missed any doses recently.  He denies any signs or symptoms of a rheumatoid arthritis flare.  He states he has had some intermittent discomfort in the right shoulder but still has good range of motion.  He has not been experiencing any nocturnal pain.  He denies any other joint pain or joint swelling at this time.  He denies any new medical conditions.  He denies any recent or recurrent infections.  He denies any recent falls or fractures.   Activities of Daily Living:  Patient denies any morning stiffness  Patient Denies nocturnal pain.  Difficulty dressing/grooming: Denies Difficulty climbing stairs: Denies Difficulty getting out of chair: Denies Difficulty using hands for taps, buttons, cutlery, and/or writing: Denies  Review of Systems  Constitutional:  Negative for fatigue.  HENT:  Negative for mouth sores and mouth dryness.   Eyes:  Negative for dryness.  Respiratory:  Negative for shortness of breath.   Cardiovascular:  Negative for chest pain and palpitations.  Gastrointestinal:  Negative for blood in stool, constipation and diarrhea.  Endocrine: Negative for increased urination.  Genitourinary:  Negative for involuntary urination.  Musculoskeletal:  Positive for joint pain and joint pain. Negative for gait problem, joint swelling, myalgias, muscle weakness, morning stiffness, muscle tenderness and myalgias.  Skin:  Negative for color change,  hair loss and sensitivity to sunlight.  Allergic/Immunologic: Negative for susceptible to infections.  Neurological:  Negative for dizziness and headaches.  Hematological:  Negative for swollen glands.  Psychiatric/Behavioral:  Negative for depressed mood and sleep disturbance. The patient is not nervous/anxious.     PMFS History:  Patient Active Problem List   Diagnosis Date Noted   Pacemaker - STJ 05/28/2022   Complete heart block ([place]) 01/17/2022   Heart block AV complete ([place])    Cardiac arrest ([place])    Cardiogenic shock (H[facility])    Malignant neoplasm of prostate ([place]) 10/24/2020   Lumbar stenosis    Heart murmur    GERD (gastroesophageal reflux disease)    Dyspnea    Cataract    Arthritis    Anxiety    Body mass index (BMI) 30.0-30.9, adult 06/23/2019   Subdural hematoma ([place]) 05/05/2019   Subarachnoid hemorrhage ([place]) 04/28/2019   History of COVID-19 04/23/2019   Rheumatoid arthritis ([place]) 11/17/2018   Spinal stenosis of lumbar region 04/23/2018   HNP (herniated nucleus pulposus), lumbar 04/23/2018   Prolapsed lumbar disc 03/03/2018   Wide-complex tachycardia 12/30/2017   Left bundle branch block (LBBB) 12/18/2017   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 12/17/2017   Lower urinary tract symptoms (LUTS) 12/2017   Abnormal LFTs    Macular edema    PCP NOTES >>>>>> 04/27/2015   Hyperglycemia 10/20/2014   Dizziness 04/21/2014   Annual physical exam 04/15/2011   Aortic stenosis 04/15/2011  History of aortic valve stenosis 2011   Hypothyroidism 10/13/2007   Hyperlipidemia 10/13/2007   Hypertension 04/08/2007    Past Medical History:  Diagnosis Date   Full dentures    Heart murmur    History of aortic valve stenosis 2011   06/ 2011 dx mild bicuspid stenosis;    then 04/ 2019 severe  stenosis   History of COVID-19 04/23/2019   positive result in epic;  per pt asymptomatic   History of subarachnoid hemorrhage 04/27/2019   hospital  admission in epic, dx bilateral frontal SAH w/ left occipital skull fx nondisplaced due to syncope w/ fall secondary to dehydration/ ortho hypotension   History of supraventricular tachycardia 12/2017   followed by cardiology   Hyperlipidemia    Hypertension    followed by cardiology and pcp   Hypothyroidism    followed by pcp   Left bundle branch block (LBBB) 12/18/2017   post op AV replacement 12-17-2017   Lower urinary tract symptoms (LUTS)    Macular edema of right eye    Dr  Sherlynn Stalls    OA (osteoarthritis)    multiple sites , followed by dr s. Estanislado Pandy   Prostate cancer [facility])    urologist--- dr Jeffie Pollock--- dx 04/ 2022, Gleason 3+4, PSA 3.76   Rheumatoid arthritis ([place]) 11/17/2018   rheumotologist-- dr s. 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   Coronary artery disease Brother        Male 1st degree relative >50, had CABG in his 16s   Emphysema Father    COPD Father    Diabetes Daughter    Colon cancer Neg Hx    Prostate cancer Neg Hx    Colon polyps Neg Hx    Esophageal cancer Neg Hx    Rectal cancer Neg Hx    Stomach cancer Neg Hx    Past Surgical History:  Procedure Laterality Date   AORTIC VALVE REPLACEMENT N/A 12/17/2017   Procedure: MINIMALLY INVASIVE AORTIC VALVE REPLACEMENT (AVR) [ using Edwards Intuity Valve Size 53m;  Surgeon: ORexene Alberts MD;  [place]  Service: Open Heart Surgery;  Laterality: N/A;  MINI THORACOTOMY   CARDIAC CATHETERIZATION  11/10/2000   '@[facility]'$  by dr kCaryl Comes  normal coronary angiography and normal LVF   CATARACT EXTRACTION W/ INTRAOCULAR LENS IMPLANT Right 2018   COLONOSCOPY     last one 02-29-2020 by dr h. danis   CYSTOSCOPY N/A 02/09/2021   Procedure: CConsuela Mimes  Surgeon: WIrine Seal MD;  [facility]  Service: Urology;  Laterality: N/A;   INGUINAL HERNIA REPAIR Right 2008   LAPAROSCOPIC CHOLECYSTECTOMY  06/26/2001   '@[facility]'$ ;  WITH OPEN LEFT INGUINAL HERNIA REPAIR   LUMBAR LAMINECTOMY/DECOMPRESSION MICRODISCECTOMY N/A 04/23/2018   Procedure: Microlumbar decompression Lumbar five-Sacral one;  Surgeon: BSusa Day MD;  Location: [place];  Service: Orthopedics;  Laterality: N/A;   PACEMAKER IMPLANT N/A 01/23/2022   Procedure: PACEMAKER IMPLANT;  Surgeon: Deboraha Sprang, MD;  Location: [place] [facility];  Service: Cardiovascular;  Laterality: N/A;   PROSTATE BIOPSY  09/20/2020   RADIOACTIVE SEED IMPLANT N/A 02/09/2021   Procedure: RADIOACTIVE SEED IMPLANT/BRACHYTHERAPY IMPLANT;  Surgeon: Irine Seal, MD;  Location[facility];  Service: Urology;  Laterality: N/A;  56 seeds   RIGHT/LEFT HEART CATH AND CORONARY ANGIOGRAPHY N/A 10/16/2017   Procedure: RIGHT/LEFT HEART CATH AND CORONARY ANGIOGRAPHY;  Surgeon: [name], MD;  Location: [place] [facility];  Service: Cardiovascular;  Laterality: N/A;   SPACE OAR INSTILLATION N/A 02/09/2021   Procedure: SPACE OAR INSTILLATION;  Surgeon: Irine Seal, MD;  Location: [facility];  Service: Urology;  Laterality: N/A;   TEE WITHOUT CARDIOVERSION N/A 12/17/2017   Procedure: TRANSESOPHAGEAL ECHOCARDIOGRAM (TEE);  Surgeon: Rexene Alberts, MD;  Location: [place];  Service: Open Heart Surgery;  Laterality: N/A;   TEMPORARY PACEMAKER N/A 01/17/2022   Procedure: TEMPORARY PACEMAKER;  Surgeon: Belva Crome, MD;   Location: [place] [facility];  Service: Cardiovascular;  Laterality: N/A;   TONSILLECTOMY  1960   Social History   Social History Narrative   ** Merged History Encounter **       Lives w/ wife 3 children, 8 g-kids        Immunization History  Administered Date(s) Administered   Fluad Quad(high Dose 65+) 02/12/2019, 03/14/2020, 05/15/2021, 03/14/2022   Influenza Split 03/11/2013, 02/17/2014   Influenza Whole 04/13/2007, 03/15/2008, 03/10/2012   Influenza, High Dose Seasonal PF 04/04/2015, 03/28/2016, 03/18/2017, 03/16/2018   PFIZER(Purple Top)SARS-COV-2 Vaccination 08/08/2019, 08/31/2019, 03/24/2020   PNEUMOCOCCAL CONJUGATE-20 06/26/2021   Pneumococcal Conjugate-13 04/21/2014   Pneumococcal Polysaccharide-23 04/15/2012   Td 06/10/1996, 06/10/2000   Tdap 04/15/2011, 10/11/2018   Zoster, Live 03/29/2015     Objective: Vital Signs: BP 138/85 (BP Location: Right Arm, Patient Position: Sitting, Cuff Size: Normal)   Pulse 67   Resp 14   Ht 5' 6.5" (1.689 m)   Wt 187 lb (84.8 kg)   BMI 29.73 kg/m    Physical Exam Vitals and nursing note reviewed.  Constitutional:      Appearance: He is well-developed.  HENT:     Head: Normocephalic and atraumatic.  Eyes:     Conjunctiva/sclera: Conjunctivae normal.     Pupils: Pupils are equal, round, and reactive to light.  Cardiovascular:     Rate and Rhythm: Normal rate and regular rhythm.     Heart sounds: Normal heart sounds.  Pulmonary:     Effort: Pulmonary effort is normal.     Breath sounds: Normal breath sounds.  Abdominal:     General: Bowel sounds are normal.     Palpations: Abdomen is soft.  Musculoskeletal:     Cervical back: Normal range of motion and neck supple.  Skin:    General: Skin is warm and dry.     Capillary Refill: Capillary refill takes less than 2 seconds.  Neurological:     Mental Status: He is alert and oriented to person, place, and time.  Psychiatric:        Behavior: Behavior normal.       Musculoskeletal Exam: C-spine, thoracic spine, lumbar spine have good range of motion.  No midline spinal tenderness.  No SI joint tenderness.  Shoulder joints, elbow joints, wrist joints, MCPs, PIPs, DIPs have good range of motion with no synovitis.  PIP and DIP thickening consistent with osteoarthritis of both hands.  CMC joint prominence noted bilaterally.  Hip joints have good range of motion with no groin pain.  Knee joints have good range of motion with no warmth or effusion.  Ankle joints have good range of motion with no tenderness or joint swelling.  CDAI Exam: CDAI Score: -- Patient Global: 3 mm; Provider Global: 3 mm Swollen: --; Tender: -- Joint Exam 08/19/2022   No joint exam has been documented for this visit   There is currently no information documented on the homunculus. Go to the Rheumatology activity and complete the homunculus joint exam.  Investigation: No additional findings.  Imaging: CUP PACEART REMOTE DEVICE CHECK  Result Date: 07/26/2022 Scheduled remote reviewed. Normal device function.  Next remote 91 days. Kathy Breach, RN, CCDS, [employer]   Recent Labs: Lab Results  Component Value Date   WBC 4.9 08/13/2022   HGB 13.6 08/13/2022   PLT 220 08/13/2022   NA 138 08/13/2022   K 4.7 08/13/2022   CL 101 08/13/2022   CO2 30 08/13/2022   GLUCOSE 90 08/13/2022   BUN 12 08/13/2022   CREATININE 1.09 08/13/2022   BILITOT 1.1 08/13/2022   ALKPHOS 58 01/20/2022   AST 31 08/13/2022   ALT 23 08/13/2022   PROT 6.9 08/13/2022   ALBUMIN 3.1 (L) 01/20/2022   CALCIUM 9.2 08/13/2022   GFRAA 71 11/13/2020   QFTBGOLDPLUS NEGATIVE 08/24/2018    Speciality Comments: No specialty comments available.  Procedures:  No procedures performed Allergies: Atorvastatin, Ezetimibe, Lipitor [atorvastatin], and Zetia [ezetimibe]       Assessment / Plan:     Visit Diagnoses: Seronegative rheumatoid arthritis ([place]): He has no synovitis on examination today.   He has not had any signs or symptoms of a rheumatoid arthritis flare.  He has clinically been doing well taking methotrexate 4 tablets by mouth once weekly along with folic acid 1 mg daily.  He continues to tolerate methotrexate without any side effects and has not missed any doses recently.  He has not had any recent or recurrent infections.  He has had some intermittent discomfort in the right shoulder but has full range of motion on examination today with no tenderness or effusion on exam.  He has not been experiencing any nocturnal pain or morning stiffness.  He was given a handout of shoulder exercises to perform.  He was advised to notify us if his symptoms persist or worsen.  Overall his rheumatoid arthritis remains well-controlled on the current treatment regimen.  No medication changes will be made at this time.  He was advised to notify us if he develops signs or symptoms of a flare.  He will follow-up in the office in 5 months or sooner if needed.  High risk medication use - Methotrexate 4 tablets by mouth once weekly and folic acid 1 mg po daily.He received annual flu shot.  He is up-to-date with the pneumonia vaccine per patient.  CBC and CMP WNL on 08/13/22. His next lab work will be due in June and every 3 months.  Standing orders for CBC and CMP remain in place.  No recent or recurrent infections.  Discussed the importance of holding methotrexate if he develops signs or symptoms of an infection and to resume once the infection has completely cleared.   Primary osteoarthritis of both hands: He has PIP and DIP thickening consistent with osteoarthritis of both hands.  No tenderness or inflammation noted.  He  has not been experiencing any morning stiffness.  Discussed the importance of joint protection and muscle strengthening.  Primary osteoarthritis of both knees: He has good range of motion of both knee joints on examination.  No warmth or effusion noted.  Primary osteoarthritis of both feet:  He has good range of motion of both ankle joints with no tenderness or joint swelling.  He is not experiencing discomfort in his feet at this time.  He wears proper fitting shoes.  Spinal stenosis of lumbar region, unspecified whether neurogenic claudication present: He is not experiencing any increased discomfort in his lower back at this time.  No symptoms of radiculopathy.  Osteopenia of multiple sites -Previous DEXA 04/23/2019 right femoral neck BMD 0.776 with T score -1.1.  DEXA updated on 04/03/22: LFN BMD 0.744 with T-score -1.0.  No recent fall or fractures.   She is taking vitamin D 2,000 units daily.    Other medical conditions are listed as follows:   Essential hypertension: BP was 138/85 today in the office.    Left bundle branch block (LBBB)  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  History of hypothyroidism  Malignant neoplasm of prostate ([place])  History of anxiety  Orders: No orders of the defined types were placed in this encounter.  No orders of the defined types were placed in this encounter.   Follow-Up Instructions: Return in about 5 months (around 01/19/2023) for Rheumatoid arthritis, Osteoarthritis.   Ofilia Neas, PA-C  Note - This record has been created using Dragon software.  Chart creation errors have been sought, but may not always  have been located. Such creation errors do not reflect on  the standard of medical care.

## 2022-08-13 ENCOUNTER — Other Ambulatory Visit: Payer: Self-pay | Admitting: *Deleted

## 2022-08-13 DIAGNOSIS — Z79899 Other long term (current) drug therapy: Secondary | ICD-10-CM

## 2022-08-14 LAB — CBC WITH DIFFERENTIAL/PLATELET
Absolute Monocytes: 603 cells/uL (ref 200–950)
Basophils Absolute: 20 cells/uL (ref 0–200)
Basophils Relative: 0.4 %
Eosinophils Absolute: 59 cells/uL (ref 15–500)
Eosinophils Relative: 1.2 %
HCT: 41.2 % (ref 38.5–50.0)
Hemoglobin: 13.6 g/dL (ref 13.2–17.1)
Lymphs Abs: 1294 cells/uL (ref 850–3900)
MCH: 31.3 pg (ref 27.0–33.0)
MCHC: 33 g/dL (ref 32.0–36.0)
MCV: 94.9 fL (ref 80.0–100.0)
MPV: 9.9 fL (ref 7.5–12.5)
Monocytes Relative: 12.3 %
Neutro Abs: 2925 cells/uL (ref 1500–7800)
Neutrophils Relative %: 59.7 %
Platelets: 220 10*3/uL (ref 140–400)
RBC: 4.34 10*6/uL (ref 4.20–5.80)
RDW: 13.6 % (ref 11.0–15.0)
Total Lymphocyte: 26.4 %
WBC: 4.9 10*3/uL (ref 3.8–10.8)

## 2022-08-14 LAB — COMPLETE METABOLIC PANEL WITH GFR
AG Ratio: 1.7 (calc) (ref 1.0–2.5)
ALT: 23 U/L (ref 9–46)
AST: 31 U/L (ref 10–35)
Albumin: 4.3 g/dL (ref 3.6–5.1)
Alkaline phosphatase (APISO): 69 U/L (ref 35–144)
BUN: 12 mg/dL (ref 7–25)
CO2: 30 mmol/L (ref 20–32)
Calcium: 9.2 mg/dL (ref 8.6–10.3)
Chloride: 101 mmol/L (ref 98–110)
Creat: 1.09 mg/dL (ref 0.70–1.28)
Globulin: 2.6 g/dL (calc) (ref 1.9–3.7)
Glucose, Bld: 90 mg/dL (ref 65–99)
Potassium: 4.7 mmol/L (ref 3.5–5.3)
Sodium: 138 mmol/L (ref 135–146)
Total Bilirubin: 1.1 mg/dL (ref 0.2–1.2)
Total Protein: 6.9 g/dL (ref 6.1–8.1)
eGFR: 70 mL/min/{1.73_m2} (ref >=60)

## 2022-08-14 NOTE — Progress Notes (Signed)
CBC and CMP are normal.

## 2022-08-19 ENCOUNTER — Encounter: Payer: Self-pay | Admitting: Physician Assistant

## 2022-08-19 ENCOUNTER — Ambulatory Visit: Payer: Medicare Other | Attending: Physician Assistant | Admitting: Physician Assistant

## 2022-08-19 VITALS — BP 138/85 | HR 67 | Resp 14 | Ht 66.5 in | Wt 187.0 lb

## 2022-08-19 DIAGNOSIS — Z79899 Other long term (current) drug therapy: Secondary | ICD-10-CM

## 2022-08-19 DIAGNOSIS — M19072 Primary osteoarthritis, left ankle and foot: Secondary | ICD-10-CM

## 2022-08-19 DIAGNOSIS — M0600 Rheumatoid arthritis without rheumatoid factor, unspecified site: Secondary | ICD-10-CM | POA: Diagnosis not present

## 2022-08-19 DIAGNOSIS — Z8659 Personal history of other mental and behavioral disorders: Secondary | ICD-10-CM

## 2022-08-19 DIAGNOSIS — M8589 Other specified disorders of bone density and structure, multiple sites: Secondary | ICD-10-CM

## 2022-08-19 DIAGNOSIS — M19041 Primary osteoarthritis, right hand: Secondary | ICD-10-CM | POA: Diagnosis not present

## 2022-08-19 DIAGNOSIS — M19042 Primary osteoarthritis, left hand: Secondary | ICD-10-CM

## 2022-08-19 DIAGNOSIS — M170 Bilateral primary osteoarthritis of knee: Secondary | ICD-10-CM

## 2022-08-19 DIAGNOSIS — M19071 Primary osteoarthritis, right ankle and foot: Secondary | ICD-10-CM

## 2022-08-19 DIAGNOSIS — I10 Essential (primary) hypertension: Secondary | ICD-10-CM

## 2022-08-19 DIAGNOSIS — C61 Malignant neoplasm of prostate: Secondary | ICD-10-CM

## 2022-08-19 DIAGNOSIS — I447 Left bundle-branch block, unspecified: Secondary | ICD-10-CM

## 2022-08-19 DIAGNOSIS — M48061 Spinal stenosis, lumbar region without neurogenic claudication: Secondary | ICD-10-CM

## 2022-08-19 DIAGNOSIS — Z953 Presence of xenogenic heart valve: Secondary | ICD-10-CM

## 2022-08-19 DIAGNOSIS — Z8639 Personal history of other endocrine, nutritional and metabolic disease: Secondary | ICD-10-CM

## 2022-08-19 NOTE — Patient Instructions (Addendum)
Standing Labs We placed an order today for your standing lab work.   Please have your standing labs drawn in June and every 3 months   Please have your labs drawn 2 weeks prior to your appointment so that the provider can discuss your lab results at your appointment, if possible.  Please note that you may see your imaging and lab results in [place] before we have reviewed them. We will contact you once all results are reviewed. Please allow our office up to 72 hours to thoroughly review all of the results before contacting the office for clarification of your results.  WALK-IN LAB HOURS  Monday through Thursday from 8:00 am -12:30 pm and 1:00 pm-5:00 pm and Friday from 8:00 am-12:00 pm.  Patients with office visits requiring labs will be seen before walk-in labs.  You may encounter longer than normal wait times. Please allow additional time. Wait times may be shorter on  Monday and Thursday afternoons.  We do not book appointments for walk-in labs. We appreciate your patience and understanding with our staff.   Labs are drawn by [facility]. Please bring your co-pay at the time of your lab draw.  You may receive a bill from [facility] for your lab work.  Please note if you are on Hydroxychloroquine and and an order has been placed for a Hydroxychloroquine level,  you will need to have it drawn 4 hours or more after your last dose.  If you wish to have your labs drawn at another location, please call the office 24 hours in advance so we can fax the orders.  The office is located at [address] [place], [place], [place] [address]   If you have any questions regarding directions or hours of operation,  please call [phone].   As a reminder, please drink plenty of water prior to coming for your lab work. Thanks!   Shoulder Exercises Ask your health care provider which exercises are safe for you. Do exercises exactly as told by your health care provider and adjust them as directed. It is  normal to feel mild stretching, pulling, tightness, or discomfort as you do these exercises. Stop right away if you feel sudden pain or your pain gets worse. Do not begin these exercises until told by your health care provider. Stretching exercises External rotation and abduction This exercise is sometimes called corner stretch. The exercise rotates your arm outward (external rotation) and moves your arm out from your body (abduction). Stand in a doorway with one of your feet slightly in front of the other. This is called a staggered stance. If you cannot reach your forearms to the door frame, stand facing a corner of a room. Choose one of the following positions as told by your health care provider: Place your hands and forearms on the door frame above your head. Place your hands and forearms on the door frame at the height of your head. Place your hands on the door frame at the height of your elbows. Slowly move your weight onto your front foot until you feel a stretch across your chest and in the front of your shoulders. Keep your head and chest upright and keep your abdominal muscles tight. Hold for __________ seconds. To release the stretch, shift your weight to your back foot. Repeat __________ times. Complete this exercise __________ times a day. Extension, standing  Stand and hold a broomstick, a cane, or a similar object behind your back. Your hands should be a little wider  than shoulder-width apart. Your palms should face away from your back. Keeping your elbows straight and your shoulder muscles relaxed, move the stick away from your body until you feel a stretch in your shoulders (extension). Avoid shrugging your shoulders while you move the stick. Keep your shoulder blades tucked down toward the middle of your back. Hold for __________ seconds. Slowly return to the starting position. Repeat __________ times. Complete this exercise __________ times a day. Range-of-motion  exercises Pendulum  Stand near a wall or a surface that you can hold onto for balance. Bend at the waist and let your left / right arm hang straight down. Use your other arm to support you. Keep your back straight and do not lock your knees. Relax your left / right arm and shoulder muscles, and move your hips and your trunk so your left / right arm swings freely. Your arm should swing because of the motion of your body, not because you are using your arm or shoulder muscles. Keep moving your hips and trunk so your arm swings in the following directions, as told by your health care provider: Side to side. Forward and backward. In clockwise and counterclockwise circles. Continue each motion for __________ seconds, or for as long as told by your health care provider. Slowly return to the starting position. Repeat __________ times. Complete this exercise __________ times a day. Shoulder flexion, standing  Stand and hold a broomstick, a cane, or a similar object. Place your hands a little more than shoulder-width apart on the object. Your left / right hand should be palm-up, and your other hand should be palm-down. Keep your elbow straight and your shoulder muscles relaxed. Push the stick up with your healthy arm to raise your left / right arm in front of your body, and then over your head until you feel a stretch in your shoulder (flexion). Avoid shrugging your shoulder while you raise your arm. Keep your shoulder blade tucked down toward the middle of your back. Hold for __________ seconds. Slowly return to the starting position. Repeat __________ times. Complete this exercise __________ times a day. Shoulder abduction, standing  Stand and hold a broomstick, a cane, or a similar object. Place your hands a little more than shoulder-width apart on the object. Your left / right hand should be palm-up, and your other hand should be palm-down. Keep your elbow straight and your shoulder muscles  relaxed. Push the object across your body toward your left / right side. Raise your left / right arm to the side of your body (abduction) until you feel a stretch in your shoulder. Do not raise your arm above shoulder height unless your health care provider tells you to do that. If directed, raise your arm over your head. Avoid shrugging your shoulder while you raise your arm. Keep your shoulder blade tucked down toward the middle of your back. Hold for __________ seconds. Slowly return to the starting position. Repeat __________ times. Complete this exercise __________ times a day. Internal rotation  Place your left / right hand behind your back, palm-up. Use your other hand to dangle an exercise band, a broomstick, or a similar object over your shoulder. Grasp the band with your left / right hand so you are holding on to both ends. Gently pull up on the band until you feel a stretch in the front of your left / right shoulder. The movement of your arm toward the center of your body is called internal rotation. Avoid shrugging  your shoulder while you raise your arm. Keep your shoulder blade tucked down toward the middle of your back. Hold for __________ seconds. Release the stretch by letting go of the band and lowering your hands. Repeat __________ times. Complete this exercise __________ times a day. Strengthening exercises External rotation  Sit in a stable chair without armrests. Secure an exercise band to a stable object at elbow height on your left / right side. Place a soft object, such as a folded towel or a small pillow, between your left / right upper arm and your body to move your elbow about 4 inches (10 cm) away from your side. Hold the end of the exercise band so it is tight and there is no slack. Keeping your elbow pressed against the soft object, slowly move your forearm out, away from your abdomen (external rotation). Keep your body steady so only your forearm moves. Hold for  __________ seconds. Slowly return to the starting position. Repeat __________ times. Complete this exercise __________ times a day. Shoulder abduction  Sit in a stable chair without armrests, or stand up. Hold a __________ lb / kg weight in your left / right hand, or hold an exercise band with both hands. Start with your arms straight down and your left / right palm facing in, toward your body. Slowly lift your left / right hand out to your side (abduction). Do not lift your hand above shoulder height unless your health care provider tells you that this is safe. Keep your arms straight. Avoid shrugging your shoulder while you do this movement. Keep your shoulder blade tucked down toward the middle of your back. Hold for __________ seconds. Slowly lower your arm, and return to the starting position. Repeat __________ times. Complete this exercise __________ times a day. Shoulder extension  Sit in a stable chair without armrests, or stand up. Secure an exercise band to a stable object in front of you so it is at shoulder height. Hold one end of the exercise band in each hand. Straighten your elbows and lift your hands up to shoulder height. Squeeze your shoulder blades together as you pull your hands down to the sides of your thighs (extension). Stop when your hands are straight down by your sides. Do not let your hands go behind your body. Hold for __________ seconds. Slowly return to the starting position. Repeat __________ times. Complete this exercise __________ times a day. Shoulder row  Sit in a stable chair without armrests, or stand up. Secure an exercise band to a stable object in front of you so it is at chest height. Hold one end of the exercise band in each hand. Position your palms so that your thumbs are facing the ceiling (neutral position). Bend each of your elbows to a 90-degree angle (right angle) and keep your upper arms at your sides. Step back or move the chair back  until the band is tight and there is no slack. Slowly pull your elbows back behind you. Hold for __________ seconds. Slowly return to the starting position. Repeat __________ times. Complete this exercise __________ times a day. Shoulder press-ups  Sit in a stable chair that has armrests. Sit upright, with your feet flat on the floor. Put your hands on the armrests so your elbows are bent and your fingers are pointing forward. Your hands should be about even with the sides of your body. Push down on the armrests and use your arms to lift yourself off the chair. Straighten your  elbows and lift yourself up as much as you comfortably can. Move your shoulder blades down, and avoid letting your shoulders move up toward your ears. Keep your feet on the ground. As you get stronger, your feet should support less of your body weight as you lift yourself up. Hold for __________ seconds. Slowly lower yourself back into the chair. Repeat __________ times. Complete this exercise __________ times a day. Wall push-ups  Stand so you are facing a stable wall. Your feet should be about one arm-length away from the wall. Lean forward and place your palms on the wall at shoulder height. Keep your feet flat on the floor as you bend your elbows and lean forward toward the wall. Hold for __________ seconds. Straighten your elbows to push yourself back to the starting position. Repeat __________ times. Complete this exercise __________ times a day. This information is not intended to replace advice given to you by your health care provider. Make sure you discuss any questions you have with your health care provider. Document Revised: 07/17/2021 Document Reviewed: 07/17/2021 [employer] Patient Education  [employer].

## 2022-08-22 NOTE — Progress Notes (Signed)
Remote pacemaker transmission.   

## 2022-09-04 IMAGING — DX DG CHEST 2V
2 series · 2 of 2 positions shown · non-contrast
Comparison: May 02, 2019.

CLINICAL DATA: Preop for seed implantation.

EXAM:
CHEST - 2 VIEW

[chest pa]
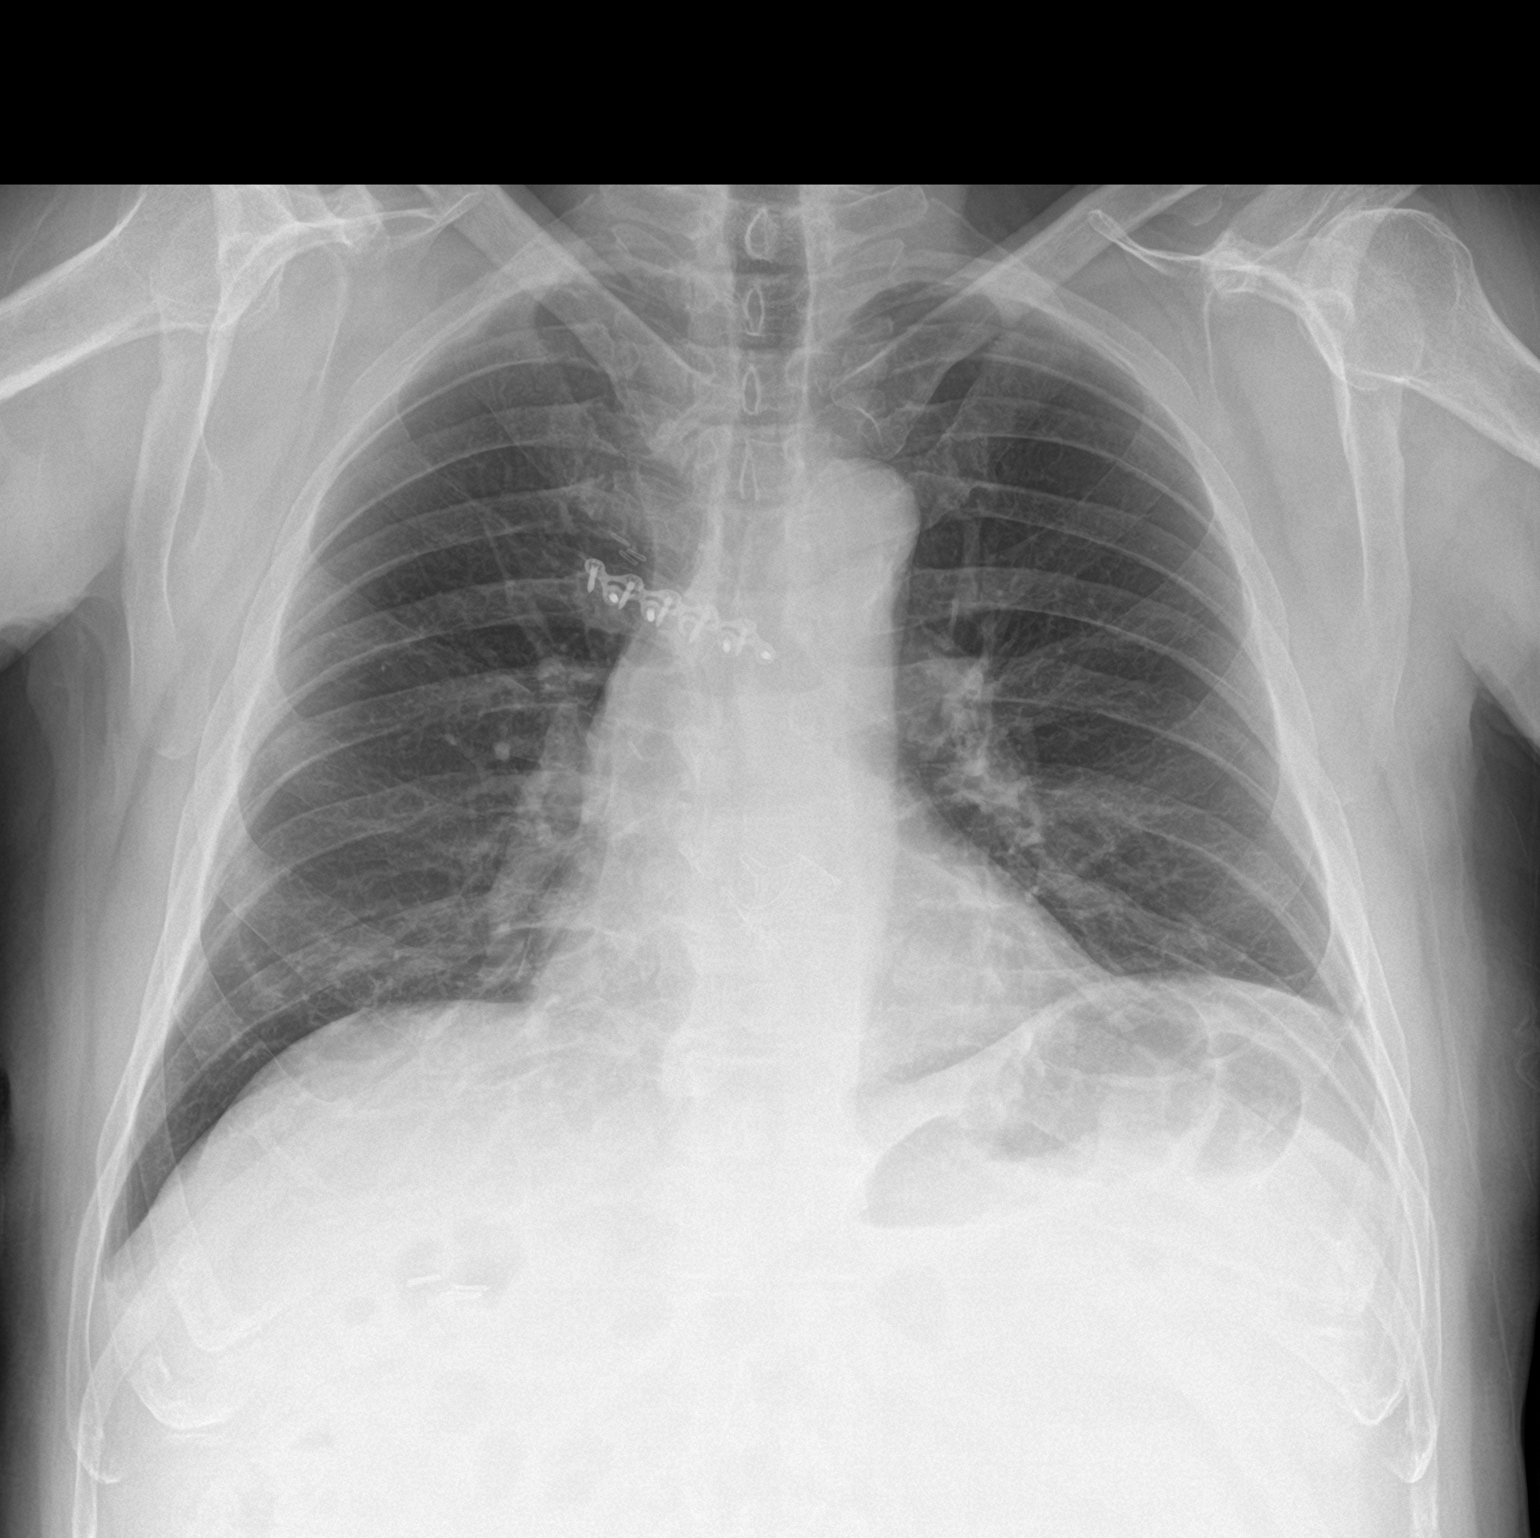

[chest lat]
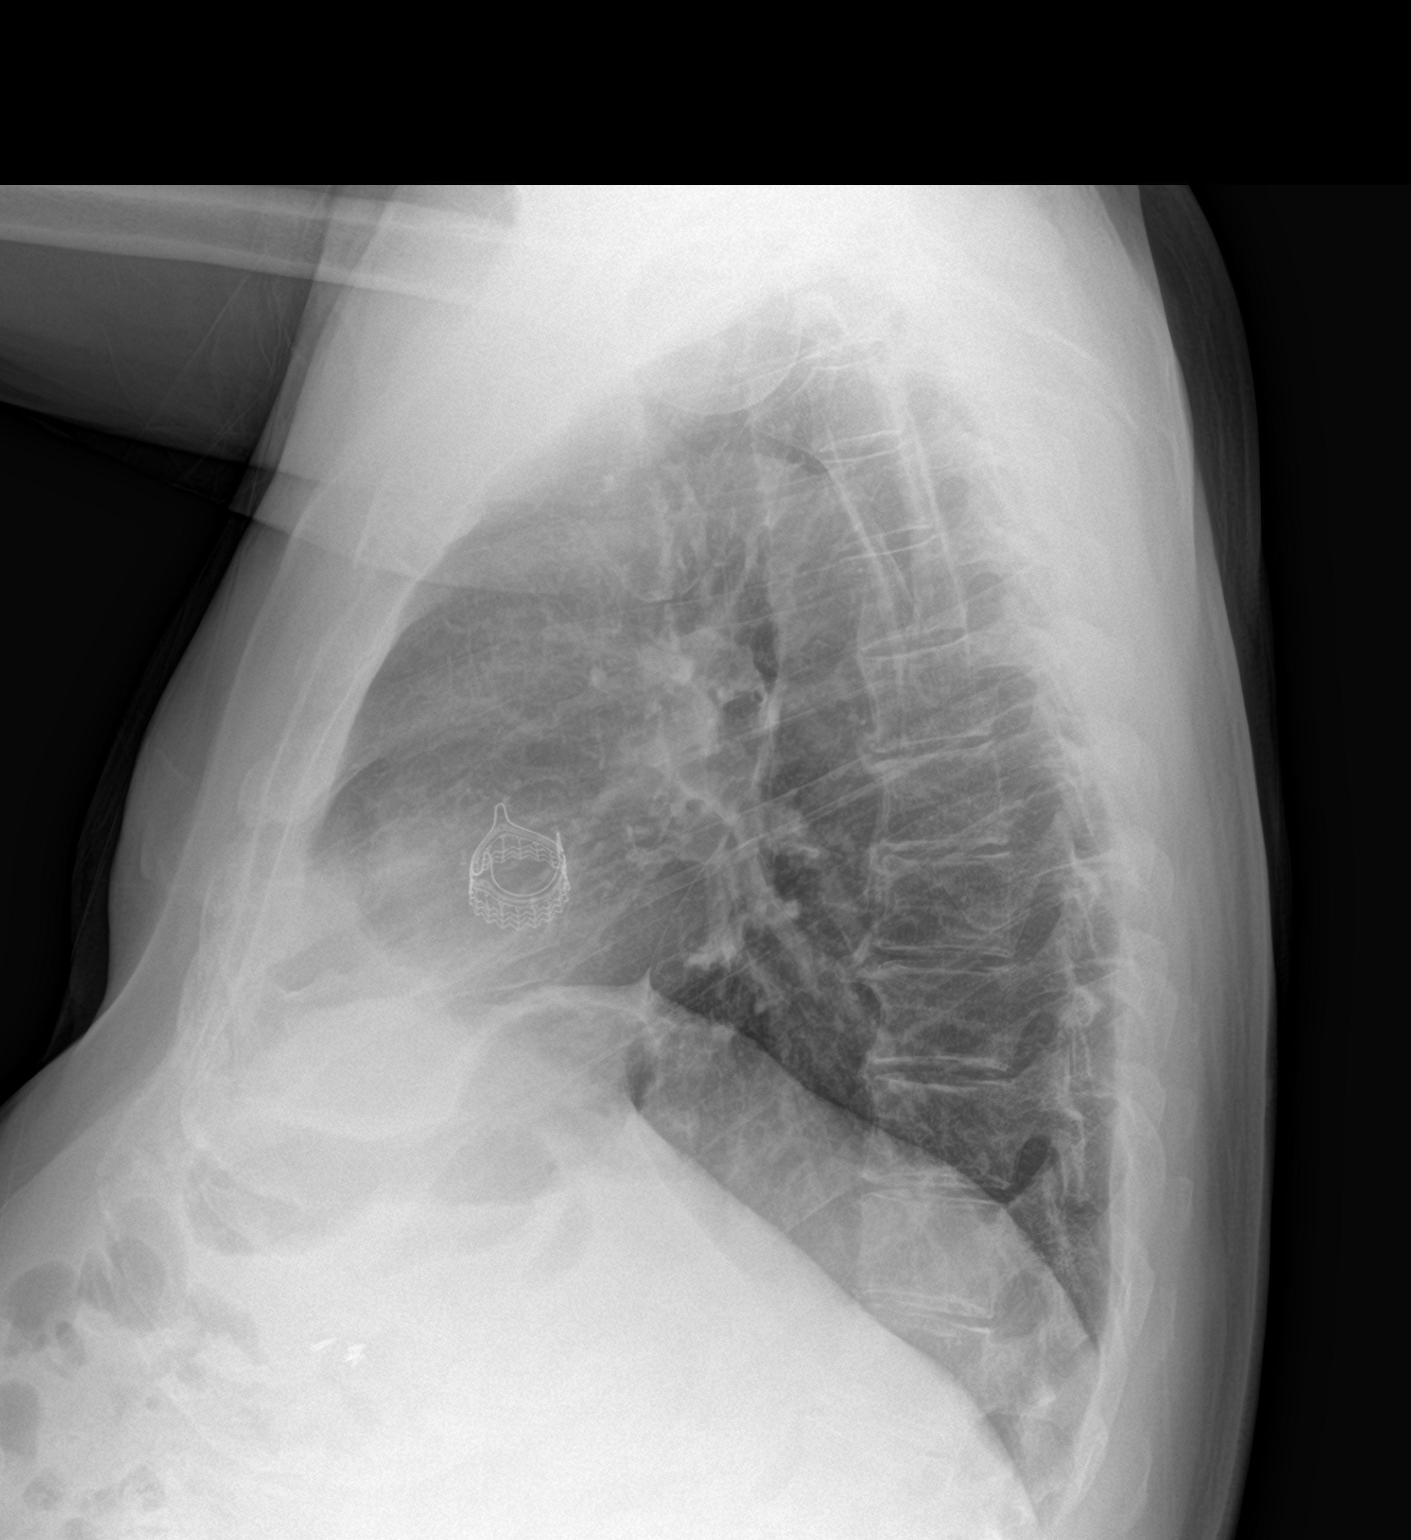

[2 of 2 positions shown; findings below may reference images not displayed]

FINDINGS: The heart size and mediastinal contours are within normal limits.
Status post aortic valve repair. Both lungs are clear. The
visualized skeletal structures are unremarkable.
IMPRESSION: No active cardiopulmonary disease.

## 2022-09-11 NOTE — Progress Notes (Signed)
Cardiology Office Note:    Date:  09/12/2022   ID:  Eric [name], DOB [birth year], MRN [id number]  PCP:  Wanda Plump, MD  Cardiologist:  Norman Herrlich, MD    Referring MD: Wanda Plump, MD    ASSESSMENT:    1. 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 Left bundle branch block (LBBB)   3. Heart block AV complete   4. Pacemaker - STJ   5. Essential hypertension   6. Hyperlipidemia, unspecified hyperlipidemia type    PLAN:    In order of problems listed above:  Eric [name] continues to do well approaching 5 years from his AVR and echocardiogram last summer no indication of valve dysfunction and no need to repeat at this time continue statin and aspirin Stable pacemaker function followed by device clinic Well-controlled hypertension at home was 130 140/70 in the past he has severe orthostatic hypotension and I would not tighten his medications he will continue his beta-blocker Lipids are ideal continue his current statin   Next appointment: 1 year   Medication Adjustments/Labs and Tests Ordered: Current medicines are reviewed at length with the patient today.  Concerns regarding medicines are outlined above.  No orders of the defined types were placed in this encounter.  No orders of the defined types were placed in this encounter.   No chief complaint on file.   History of Present Illness:    Eric [name] is a 77 y.o. male with a hx of severe symptomatic aortic stenosis with surgical bioprosthetic AVR left bundle branch block hypertension hyperlipidemia hypothyroidism fall with subarachnoid hemorrhage COVID-19 infection prostate cancer with brachytherapy and Medtronic dual-chamber pacemaker 01/19/2022 for heart block.  He was last seen 02/21/2022.  Compliance with diet, lifestyle and medications: Yes  Doing well since his placement Recently had microscopic hematuria cystoscopy and CT scan no pathology found To have edema shortness of  breath chest pain palpitation or syncope Past Medical History:  Diagnosis Date   Full dentures    Heart murmur    History of aortic valve stenosis 2011   06/ 2011 dx mild bicuspid stenosis;    then 04/ 2019 severe  stenosis   History of COVID-19 04/23/2019   positive result in epic;  per pt asymptomatic   History of subarachnoid hemorrhage 04/27/2019   hospital admission in epic, dx bilateral frontal SAH w/ left occipital skull fx nondisplaced due to syncope w/ fall secondary to dehydration/ ortho hypotension   History of supraventricular tachycardia 12/2017   followed by cardiology   Hyperlipidemia    Hypertension    followed by cardiology and pcp   Hypothyroidism    followed by pcp   Left bundle branch block (LBBB) 12/18/2017   post op AV replacement 12-17-2017   Lower urinary tract symptoms (LUTS)    Macular edema of right eye    Dr  Stephannie Li    OA (osteoarthritis)    multiple sites , followed by dr s. Corliss Skains   Prostate cancer    urologist--- dr Annabell Howells--- dx 04/ 2022, Gleason 3+4, PSA 3.76   Rheumatoid arthritis 11/17/2018   rheumotologist-- dr s. 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 12/17/2017   Edwards Intuity Elite rapid deployment stented bovine pericardial tissue valve via right mini thoracotomy approach   SOB (shortness of breath)    per pt occasionally with yard work, but ok with stairs and normal activities   Spinal stenosis of  lumbar region 04/23/2018   Wide-complex tachycardia 12/30/2017   (followed by dr Dulce Sellar)  post cardioversion by EMS 12-30-2017 for SVT 170s post op AV replacement 12-17-2017 , CHF, AKI    Past Surgical History:  Procedure Laterality Date   AORTIC VALVE REPLACEMENT N/A 12/17/2017   Procedure: MINIMALLY INVASIVE AORTIC VALVE REPLACEMENT (AVR) [ using Edwards Intuity Valve Size 23mm;  Surgeon: Purcell Nails, MD;  Location: [facility] OR;  Service: Open Heart Surgery;   Laterality: N/A;  MINI THORACOTOMY   CARDIAC CATHETERIZATION  11/10/2000   @[facility]  by dr Graciela Husbands;  normal coronary angiography and normal LVF   CATARACT EXTRACTION W/ INTRAOCULAR LENS IMPLANT Right 2018   COLONOSCOPY     last one 02-29-2020 by dr h. danis   CYSTOSCOPY N/A 02/09/2021   Procedure: Bluford Kaufmann;  Surgeon: Bjorn Pippin, MD;  Location: [facility];  Service: Urology;  Laterality: N/A;   INGUINAL HERNIA REPAIR Right 2008   LAPAROSCOPIC CHOLECYSTECTOMY  06/26/2001   @[facility] ;  WITH OPEN LEFT INGUINAL HERNIA REPAIR   LUMBAR LAMINECTOMY/DECOMPRESSION MICRODISCECTOMY N/A 04/23/2018   Procedure: Microlumbar decompression Lumbar five-Sacral one;  Surgeon: Jene Every, MD;  Location: [facility] OR;  Service: Orthopedics;  Laterality: N/A;   PACEMAKER IMPLANT N/A 01/23/2022   Procedure: PACEMAKER IMPLANT;  Surgeon: Duke Salvia, MD;  Location: [facility] INVASIVE CV LAB;  Service: Cardiovascular;  Laterality: N/A;   PROSTATE BIOPSY  09/20/2020   RADIOACTIVE SEED IMPLANT N/A 02/09/2021   Procedure: RADIOACTIVE SEED IMPLANT/BRACHYTHERAPY IMPLANT;  Surgeon: Bjorn Pippin, MD;  Location: [facility];  Service: Urology;  Laterality: N/A;  56 seeds   RIGHT/LEFT HEART CATH AND CORONARY ANGIOGRAPHY N/A 10/16/2017   Procedure: RIGHT/LEFT HEART CATH AND CORONARY ANGIOGRAPHY;  Surgeon: Tonny Bollman, MD;  Location: [facility] INVASIVE CV LAB;  Service: Cardiovascular;  Laterality: N/A;   SPACE OAR INSTILLATION N/A 02/09/2021   Procedure: SPACE OAR INSTILLATION;  Surgeon: Bjorn Pippin, MD;  Location: [facility];  Service: Urology;  Laterality: N/A;   TEE WITHOUT CARDIOVERSION N/A 12/17/2017   Procedure: TRANSESOPHAGEAL ECHOCARDIOGRAM (TEE);  Surgeon: Purcell Nails, MD;  Location: [facility];  Service: Open Heart Surgery;  Laterality: N/A;   TEMPORARY PACEMAKER N/A 01/17/2022   Procedure: TEMPORARY PACEMAKER;  Surgeon: Lyn Records, MD;  Location: [facility] INVASIVE CV LAB;  Service: Cardiovascular;   Laterality: N/A;   TONSILLECTOMY  1960    Current Medications: Current Meds  Medication Sig   acetaminophen (TYLENOL) 500 MG tablet Take 500 mg by mouth every 6 (six) hours as needed for mild pain or headache.   aspirin EC 81 MG tablet Take 81 mg by mouth daily.   carvedilol (COREG) 12.5 MG tablet TAKE 1 TABLET (12.5MG  TOTAL) BY MOUTH TWICE A DAY WITH MEALS   Cholecalciferol (VITAMIN D3) 50 MCG (2000 UT) TABS Take 2,000 Units by mouth daily.   folic acid (FOLVITE) 1 MG tablet Take 1 mg by mouth daily.   levothyroxine (SYNTHROID) 88 MCG tablet Take 1 tablet (88 mcg total) by mouth daily before breakfast.   methotrexate (RHEUMATREX) 2.5 MG tablet Take 3 tablets (7.5 mg total) by mouth once a week.   rosuvastatin (CRESTOR) 20 MG tablet Take 1 tablet (20 mg total) by mouth at bedtime.     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    Comment: 05/2008  Tobacco Use   Smoking status: Former    Packs/day: 2.00    Years: 35.00    Additional pack years: 0.00    Total pack years: 70.00    Types: Cigarettes    Quit date: 04/15/1990    Years since quitting: 32.4    Passive exposure: Never   Smokeless tobacco: Never  Vaping Use   Vaping Use: Never used  Substance and Sexual Activity   Alcohol use: Not Currently    Comment: no ETOH since the 90s   Drug use: Never   Sexual activity: Not Currently  Other Topics Concern   Not on file  Social History Narrative   ** Merged History Encounter **       Lives w/ wife 3 children, 8 g-kids        Social Determinants of Health   Financial Resource Strain: Low Risk  (07/09/2021)   Overall Financial Resource Strain (CARDIA)     Difficulty of Paying Living Expenses: Not hard at all  Food Insecurity: No Food Insecurity (07/09/2021)   Hunger Vital Sign    Worried About Running Out of Food in the Last Year: Never true    Ran Out of Food in the Last Year: Never true  Transportation Needs: No Transportation Needs (07/09/2021)   PRAPARE - Administrator, Civil Service (Medical): No    Lack of Transportation (Non-Medical): No  Physical Activity: Inactive (07/09/2021)   Exercise Vital Sign    Days of Exercise per Week: 0 days    Minutes of Exercise per Session: 0 min  Stress: No Stress Concern Present (07/09/2021)   [employer] of Occupational Health - Occupational Stress Questionnaire    Feeling of Stress : Not at all  Social Connections: Moderately Integrated (07/09/2021)   Social Connection and Isolation Panel [NHANES]    Frequency of Communication with Friends and Family: More than three times a week    Frequency of Social Gatherings with Friends and Family: Three times a week    Attends Religious Services: More than 4 times per year    Active Member of Clubs or Organizations: No    Attends Banker Meetings: Never    Marital Status: Married     Family History: The patient's family history includes COPD in his father; Coronary artery disease in his brother; Diabetes in his brother, daughter, and mother; Emphysema in his father; Stroke in his mother. There is no history of Colon cancer, Prostate cancer, Colon polyps, Esophageal cancer, Rectal cancer, or Stomach cancer. ROS:   Please see the history of present illness.    All other systems reviewed and are negative.  EKGs/Labs/Other Studies Reviewed:    The following studies were reviewed today:  Cardiac Studies & Procedures   CARDIAC CATHETERIZATION  CARDIAC CATHETERIZATION 01/18/2022  Narrative Successful placement of temporary pacemaker from the right internal jugular approach without complications.   CARDIAC  CATHETERIZATION  CARDIAC CATHETERIZATION 10/16/2017  Narrative 1. Known severe aortic stenosis by noninvasive assessment 2. Widely patent coronary arteries with minor luminal irregularity (right dominant) 3. Normal right-sided intracardiac pressures  Plan: CTA studies, cardiac surgical evaluation for aortic valve replacement  Findings Coronary Findings Diagnostic  Dominance: Right  Left Main Vessel is angiographically normal.  Left Anterior Descending The vessel exhibits minimal luminal irregularities.  The LAD wraps the LV apex. The vessel has minor irregularity and is patent throughout.  Ramus Intermedius The vessel exhibits minimal luminal irregularities.  Left Circumflex The vessel exhibits minimal luminal irregularities.  Right Coronary Artery Vessel is moderate in size. Vessel is angiographically normal. Dominant RCA, no significant stenosis  Intervention  No interventions have been documented.     ECHOCARDIOGRAM  ECHOCARDIOGRAM COMPLETE 01/18/2022  Narrative ECHOCARDIOGRAM REPORT    Patient Name:   Eric [name] Date of Exam: 01/18/2022 Medical Rec #:  [id number]      Height:       69.0 in Accession #:    [id number]     Weight:       194.9 lb Date of Birth:  [birth year]      BSA:          2.043 m Patient Age:    76 years       BP:           111/64 mmHg Patient Gender: M              HR:           80 bpm. Exam Location:  Inpatient  Procedure: 2D Echo, Cardiac Doppler and Color Doppler  Indications:     Complete heart block  History:         Patient has prior history of Echocardiogram examinations, most recent 04/28/2019. Aortic Valve Disease. Temporary pacemaker. Complete heart block. Cardiogenic shock. AVR - Edwards Intuity Valve Size 23mm.  Sonographer:     Ross Ludwig RDCS (AE) Referring Phys:  Hughie Closs CHRISTOPHER Diagnosing Phys: Armanda Magic MD   Sonographer Comments: Echo performed with patient supine and on artificial  respirator. IMPRESSIONS   1. Left ventricular ejection fraction, by estimation, is 60 to 65%. The left ventricle has normal function. The left ventricle has no regional wall motion abnormalities. There is mild concentric left ventricular hypertrophy. Left ventricular diastolic function could not be evaluated. 2. Right ventricular systolic function is normal. The right ventricular size is normal. Tricuspid regurgitation signal is inadequate for assessing PA pressure. 3. The mitral valve is normal in structure. No evidence of mitral valve regurgitation. No evidence of mitral stenosis. 4. The aortic valve was not well visualized. There is a 23 mm Edwards Intuity valve present in the aortic position. Aortic valve regurgitation is not visualized. No evidence of aortic valve stenosis. Aortic valve area, by VTI measures 2.33 cm. Aortic valve mean gradient measures 2.0 mmHg. Aortic valve Vmax measures 0.91 m/s. 5. The inferior vena cava is dilated in size with >50% respiratory variability, suggesting right atrial pressure of 8 mmHg.  FINDINGS Left Ventricle: Left ventricular ejection fraction, by estimation, is 60 to 65%. The left ventricle has normal function. The left ventricle has no regional wall motion abnormalities. The left ventricular internal cavity size was normal in size. There is mild concentric left ventricular hypertrophy. Left ventricular diastolic function could not be evaluated.  Right Ventricle: The right ventricular size is normal. No increase in right ventricular wall thickness. Right ventricular systolic function is normal. Tricuspid regurgitation signal is inadequate for assessing PA pressure.  Left Atrium: Left atrial size was normal in size.  Right Atrium: Right atrial size was normal in size.  Pericardium: There is no evidence of pericardial effusion.  Mitral Valve: The mitral valve is normal in structure. No evidence of mitral valve regurgitation. No evidence of mitral  valve stenosis.  Tricuspid Valve: The tricuspid valve is normal in structure. Tricuspid  valve regurgitation is not demonstrated. No evidence of tricuspid stenosis.  Aortic Valve: The aortic valve was not well visualized. Aortic valve regurgitation is not visualized. Aortic valve mean gradient measures 2.0 mmHg. Aortic valve peak gradient measures 3.3 mmHg. Aortic valve area, by VTI measures 2.33 cm. There is a 23 mm Edwards Intuity valve present in the aortic position.  Pulmonic Valve: The pulmonic valve was normal in structure. Pulmonic valve regurgitation is not visualized. No evidence of pulmonic stenosis.  Aorta: The aortic root is normal in size and structure.  Venous: The inferior vena cava is dilated in size with greater than 50% respiratory variability, suggesting right atrial pressure of 8 mmHg.  IAS/Shunts: No atrial level shunt detected by color flow Doppler.   LEFT VENTRICLE PLAX 2D LVIDd:         3.10 cm   Diastology LVIDs:         2.50 cm   LV e' medial:   4.67 cm/s LV PW:         1.30 cm   LV E/e' medial: 17.0 LV IVS:        1.20 cm LVOT diam:     2.30 cm LV SV:         36 LV SV Index:   18 LVOT Area:     4.15 cm   IVC IVC diam: 2.10 cm  LEFT ATRIUM             Index LA diam:        3.30 cm 1.61 cm/m LA Vol (A2C):   47.3 ml 23.15 ml/m LA Vol (A4C):   42.2 ml 20.65 ml/m LA Biplane Vol: 44.7 ml 21.88 ml/m AORTIC VALVE AV Area (Vmax):    3.26 cm AV Area (Vmean):   2.81 cm AV Area (VTI):     2.33 cm AV Vmax:           91.10 cm/s AV Vmean:          69.800 cm/s AV VTI:            0.156 m AV Peak Grad:      3.3 mmHg AV Mean Grad:      2.0 mmHg LVOT Vmax:         71.40 cm/s LVOT Vmean:        47.200 cm/s LVOT VTI:          0.087 m LVOT/AV VTI ratio: 0.56  AORTA Ao Root diam: 2.70 cm Ao Asc diam:  2.40 cm  MITRAL VALVE MV Area (PHT): 3.05 cm    SHUNTS MV Decel Time: 249 msec    Systemic VTI:  0.09 m MV E velocity: 79.60 cm/s  Systemic Diam:  2.30 cm MV A velocity: 23.40 cm/s MV E/A ratio:  3.40  Armanda Magic MD Electronically signed by Armanda Magic MD Signature Date/Time: 01/18/2022/11:50:54 AM    Final (Updated)   TEE  ECHO TEE 12/17/2017  Narrative *[place]* *[facility]* [address] [place], [place] [address] [phone]  ------------------------------------------------------------------- Intraoperative Transesophageal Echocardiography  Patient:    Quintavious, Nauss MR #:       [id number] Study Date: 12/17/2017 Gender:     M Age:        52 Height:     172.7 cm Weight:     85.7 kg BSA:        2 m^2 Pt. Status: Room:       2H25C  ADMITTING    Tressie Stalker, M.D.  ATTENDING    Tressie Stalker, M.D. ORDERING     Tressie Stalker, M.D. REFERRING    Tressie Stalker, M.D. PERFORMING   Jairo Ben, MD SONOGRAPHER  Sheralyn Boatman  cc:  ------------------------------------------------------------------- LV EF: 60% -   65%  ------------------------------------------------------------------- Indications:     Severe Aortic stenosis 424.1.  ------------------------------------------------------------------- History:   Risk factors:  Hypertension. Dyslipidemia.  ------------------------------------------------------------------- Study Conclusions  - Left ventricle: The cavity size was normal. Wall thickness was normal. Systolic function was normal. The estimated ejection fraction was in the range of 60% to 65%. Wall motion was normal; there were no regional wall motion abnormalities. - Aortic valve: Normal-sized, mildly to moderately calcified annulus. Trileaflet; severely thickened leaflets. Cusp separation was moderately reduced. Noncoronary cusp mobility was severely restricted. There was mild to moderate regurgitation originating from the central coaptation point and directed towards the mitral anterior leaflet. Mean gradient (S): 37 mm Hg. Peak gradient (S): 73 mm Hg. Valve area  (VTI): 0.50 cm^2. Valve area (Vmax): 0.52 cm^2. - Mitral valve: Normal-sized, noncalcified annulus. Normal thickness leaflets . The possibility of chordal rupture cannot be excluded, yet there is only trivial MR. Valve area by continuity equation (using LVOT flow): 2.24 cm^2. - Staged echo: Limited Post-CPB exam: Good LV function. EF 55-65%. Wall motion normal. The Prosthetic aortic valve is well-seated in the Aortic annulus. The leaflets are functioning well. No AI seen in LV outflow tract. Post aortic valve replacement images demonstrate no residual valvular insufficiency or perivalvular leak. Peak gradient 13 mmHg, mean gradient 5 mmHg, AVA (VTI) 1.81 cm2. No change post bypass in mitral valve function. Moderate TR was evident on initial separation from CPB, but this improved significantly with time off of CPB.  Impressions:  - Post aortic valve replacement surgery, the prosthetic aortic valve appears to be functioning normally. Excellent prosthetic aortic valve function without evidence for perivalvular leak, insufficiency, or stenosis. Other valves essentially unchanged. No other significant changes from pre-bypass images.  ------------------------------------------------------------------- Study data:   Study status:  Routine.  Consent:  The risks, benefits, and alternatives to the procedure were explained to the patient and informed consent was obtained.  Procedure:  The patient reported no pain pre or post test. Sedation. General anesthesia was administered by anesthesiology staff. Transesophageal echocardiography. An adult multiplane transesophageal probe was inserted by the anesthesiologistwithout difficulty. Image quality was adequate.  Study completion:  There were no complications. Intraoperative transesophageal echocardiography.  Birthdate: Patient birthdate: 1945-12-28.  Age:  Patient is 77 yr old.  Sex: Gender: male.    BMI: 28.7 kg/m^2.  Blood pressure:      148/69 Patient status:  Inpatient.  Study date:  Study date: 12/17/2017. Study time: 04:49 AM.  Location:  Operating room.  -------------------------------------------------------------------  ------------------------------------------------------------------- Left ventricle:  The cavity size was normal. Wall thickness was normal. Systolic function was normal. The estimated ejection fraction was in the range of 60% to 65%. Wall motion was normal; there were no regional wall motion abnormalities.  ------------------------------------------------------------------- Aortic valve:   Normal-sized, mildly to moderately calcified annulus. Trileaflet; severely thickened leaflets. Cusp separation was moderately reduced.  Noncoronary cusp mobility was severely restricted.  Doppler:  Transvalvular velocity was increased, Vmax 426 cm/s. There was mild to moderate regurgitation originating from the central coaptation point and directed towards the mitral anterior leaflet.    VTI ratio of LVOT to aortic valve: 0.34. Valve area (VTI): 0.50 cm^2. Indexed valve area (VTI): 0.25 cm^2/m^2. Peak velocity ratio of LVOT to aortic valve: 0.32. Valve  area (Vmax): 0.52 cm^2. Indexed valve area (Vmax): 0.5 cm^2/m^2. Mean velocity ratio of LVOT to aortic valve: 0.32.    Mean gradient (S): 37 mm Hg. Peak gradient (S): 73 mm Hg.  ------------------------------------------------------------------- Aorta:  The descending aorta was only mildly calcified. LVOT: 1.7 cm, Sinus of Valsalva 2.65 cm, ST Ridge: 2.02 cm.  ------------------------------------------------------------------- Mitral valve:   Normal-sized, noncalcified annulus. Normal thickness leaflets . Leaflet separation was normal. Mobility was not restricted. No echocardiographic evidence for prolapse. The possibility of chordal rupture cannot be excluded, yet there is only trivial MR.  Doppler:  Transvalvular velocity was within the normal range. There  was no evidence for stenosis.    Valve area by continuity equation (using LVOT flow): 2.24 cm^2. Indexed valve area by continuity equation (using LVOT flow): 1.12 cm^2/m^2. Mean gradient (D): 1 mm Hg.  ------------------------------------------------------------------- Left atrium:  The atrium was normal in size.  No evidence of thrombus in the atrial cavity or appendage.  ------------------------------------------------------------------- Atrial septum:  No defect or patent foramen ovale was identified on color doppler interrogation.  ------------------------------------------------------------------- Pulmonary veins:  Visualization of the pulmonary venous anatomy is incomplete, but a significant abnormality is unlikely.  ------------------------------------------------------------------- Right ventricle:  The cavity size was normal. Wall thickness was normal. Systolic function was normal.  ------------------------------------------------------------------- Pulmonic valve:    Structurally normal valve.   Cusp separation was normal.  No evidence of vegetation.  Doppler:  There was trivial regurgitation around the PA catheter.  ------------------------------------------------------------------- Tricuspid valve:   Structurally normal valve.   Leaflet separation was normal.  No evidence of vegetation.  Doppler:  There was mild regurgitation.  ------------------------------------------------------------------- Pulmonary artery:   The main pulmonary artery was normal-sized.  ------------------------------------------------------------------- Right atrium:  The atrium was normal in size.  No evidence of thrombus.  ------------------------------------------------------------------- Pre bypass:  Post bypass:  ------------------------------------------------------------------- Post procedure conclusions Left ventricle:  Limited Post-CPB exam: Good LV function. EF 55-65%. Wall  motion normal. Aortic valve:  - The Prosthetic aortic valve is well-seated in the Aortic annulus. The leaflets are functioning well. No AI seen in LV outflow tract. Post aortic valve replacement images demonstrate no residual valvular insufficiency or perivalvular leak. Peak gradient 13 mmHg, mean gradient 5 mmHg, AVA (VTI) 1.81 cm2.  Mitral valve:  - No change post bypass in mitral valve function. Moderate TR was evident on initial separation from CPB, but this improved significantly with time off of CPB.  Ascending Aorta:  - The descending aorta was only mildly calcified. LVOT: 1.7 cm, Sinus of Valsalva 2.65 cm, ST Ridge: 2.02 cm.  ------------------------------------------------------------------- Measurements  Left ventricle                            Value          Reference LV ID, ED, PLAX chordal               (L) 39.6  mm       43 - 52 LV ID, ES, PLAX chordal                   27.1  mm       23 - 38 LV fx shortening, PLAX chordal            32    %        >=29 Stroke volume, 2D  73    ml       --------- Stroke volume/bsa, 2D                     37    ml/m^2   ---------  LVOT                                      Value          Reference LVOT ID, S                                20    mm       --------- LVOT area                                 3.14  cm^2     --------- LVOT peak velocity, S                     97.1  cm/s     --------- LVOT mean velocity, S                     61.2  cm/s     --------- LVOT VTI, S                               23.4  cm       ---------  Aortic valve                              Value          Reference Aortic valve peak velocity, S             304   cm/s     --------- Aortic valve mean velocity, S             192   cm/s     --------- Aortic valve VTI, S                       68.2  cm       --------- Aortic mean gradient, S                   21    mm Hg    --------- Aortic peak gradient, S                   37    mm  Hg    --------- VTI ratio, LVOT/AV                        0.34           --------- Aortic valve area, VTI                    1.08  cm^2     --------- Aortic valve area/bsa, VTI                0.54  cm^2/m^2 --------- Velocity ratio, peak, LVOT/AV             0.32           ---------  Aortic valve area, peak velocity          1     cm^2     --------- Aortic valve area/bsa, peak velocity      0.5   cm^2/m^2 --------- Velocity ratio, mean, LVOT/AV             0.32           ---------  Mitral valve                              Value          Reference Mitral mean velocity, D                   40.7  cm/s     --------- Mitral mean gradient, D                   1     mm Hg    --------- Mitral valve area, LVOT continuity        2.24  cm^2     --------- Mitral valve area/bsa, LVOT               1.12  cm^2/m^2 --------- continuity Mitral annulus VTI, D                     32.8  cm       ---------  Legend: (L)  and  (H)  mark values outside specified reference range.  ------------------------------------------------------------------- Prepared and Electronically Authenticated by  Jairo Ben, MD 2019-07-10T17:07:21   MONITORS  LONG TERM MONITOR (3-14 DAYS) 03/15/2019  Narrative An extended ZIO monitor was performed for 13 days and 17 hours beginning 02/16/2019 to evaluate near syncope.  Baseline rhythm is sinus bundle branch block is seen minimum average and maximum heart rates are 44 ,65 and 108 bpm.  There were no pauses of 3 seconds or greater and no episodes of high degree AV block or sinus node exit block.  Ventricular ectopy was rare with isolated PVCs  Supraventricular ectopy was rare with APCs there were 2 brief runs of atrial tachycardia the longest 13 complexes at 128 bpm.  There were no episodes of atrial fibrillation or flutter.  There were 5 triggered and 3 diary events.  1 of the triggered events showed infrequent PVCs all the other triggered and diary events showed  sinus rhythm and no arrhythmia.   Conclusion rare ventricular and supraventricular ectopy without bradycardia.  Triggered and symptomatic events are predominantly unassociated with arrhythmia.  2 brief runs of atrial tachycardia asymptomatic.   CT SCANS  CT CORONARY MORPH W/CTA COR W/SCORE 11/10/2017  Addendum 11/10/2017 12:08 PM ADDENDUM REPORT: 11/10/2017 12:06  CLINICAL DATA:  Aortic stenosis  EXAM: Cardiac TAVR CT  TECHNIQUE: The patient was scanned on a Siemens 192 scanner. A 120 kV retrospective scan was triggered in the descending thoracic aorta at 111 HU's. Gantry rotation speed was 270 msecs and collimation was .9 mm. No beta blockade or nitro were given. The 3D data set was reconstructed in 5% intervals of the R-R cycle. Systolic and diastolic phases were analyzed on a dedicated work station using MPR, MIP and VRT modes. The patient received 80 cc of contrast.  FINDINGS: Aortic Valve: Tri leaflet and calcified with restricted leaflet motion  Aorta: Non aneurysmal with mild calcific atherosclerosis Arch not visualized on this study  Sino-tubular Junction: 25.5 mm  Ascending Thoracic Aorta:  28 mm  Sinus of Valsalva Measurements:  Non-coronary: 28.2 mm  Right  -coronary: 27 mm  Left - coronary: 28.4 mm  Coronary Artery Height above Annulus:  Left Main: 14.4 mm above annulus  Right Coronary: 16.5 mm above annulus  Virtual Basal Annulus Measurements:  Maximum/Minimum Diameter: 27.1 mm x 22.2 mm  Perimeter: 77.7 mm  Area: 449 mm2  Coronary Arteries: Sufficient height above annulus for deployment  Optimum Fluoroscopic Angle for Delivery: LAO 10 degrees Caudal 2 degrees  IMPRESSION: 1. Aortic Stenosis with tri leaflet valve annulus of 449 mm2 suitable for a 26 mm Sapien 3 valve  2. Normal aortic root with no aneurysm and mild atherosclerosis 2.8 cm  3. Optimum angiographic angle for deployment LAO 10 Caudal 2 degrees  4.  Coronary arteries  sufficient height above annulus for deployment  5.  No LAA thrombus  Charlton Haws   Electronically Signed By: Charlton Haws M.D. On: 11/10/2017 12:06  Narrative EXAM: OVER-READ INTERPRETATION  CT CHEST  The following report is an over-read performed by radiologist Dr. Cleone Slim of [facility] on 11/10/2017. This over-read does not include interpretation of cardiac or coronary anatomy or pathology. The coronary CTA interpretation by the cardiologist is attached.  COMPARISON:  09/30/2017 chest radiograph.  FINDINGS: Cardiovascular: No significant pericardial effusion/thickening. Thoracic aortic atherosclerosis. Great vessels are normal in course and caliber. No central pulmonary emboli.  Mediastinum/Nodes: Unremarkable esophagus. No pathologically enlarged mediastinal or hilar lymph nodes.  Lungs/Pleura: No pneumothorax. No pleural effusion. No acute consolidative airspace disease or lung masses. Subpleural peripheral right middle lobe 2 mm solid pulmonary nodule (series 11/image 24).  Upper abdomen: No acute abnormality.  Musculoskeletal: No aggressive appearing focal osseous lesions. Moderate thoracic spondylosis.  IMPRESSION: Right middle lobe 2 mm solid pulmonary nodule. No follow-up needed if patient is low-risk. Non-contrast chest CT can be considered in 12 months if patient is high-risk. This recommendation follows the consensus statement: Guidelines for Management of Incidental Pulmonary Nodules Detected on CT Images:From the Fleischner Society 2017; published online before print (10.1148/[id number]).  Aortic Atherosclerosis (ICD10-I70.0).  Electronically Signed: By: Delbert Phenix M.D. On: 11/10/2017 11:32          EKG done by EP 05/29/2022 showed atrial paced rhythm intrinsic conduction left bundle branch block  Recent Labs: 01/24/2022: Magnesium 1.8 06/27/2022: TSH 1.43 08/13/2022: ALT 23; BUN 12; Creat 1.09; Hemoglobin 13.6;  Platelets 220; Potassium 4.7; Sodium 138  Recent Lipid Panel    Component Value Date/Time   CHOL 159 06/27/2022 0948   CHOL 172 02/24/2018 1150   TRIG 115.0 06/27/2022 0948   TRIG 115 05/05/2006 0913   HDL 54.40 06/27/2022 0948   HDL 61 02/24/2018 1150   CHOLHDL 3 06/27/2022 0948   VLDL 23.0 06/27/2022 0948   LDLCALC 82 06/27/2022 0948   LDLCALC 110 (H) 02/22/2020 1110   LDLDIRECT 76.5 04/07/2008 1057    Physical Exam:    VS:  BP (!) 148/76 (BP Location: Right Arm, Patient Position: Sitting)   Pulse 74   Ht 5' 6.5" (1.689 m)   Wt 188 lb (85.3 kg)   SpO2 96%   BMI 29.89 kg/m     Wt Readings from Last 3 Encounters:  09/12/22 188 lb (85.3 kg)  08/19/22 187 lb (84.8 kg)  06/27/22 187 lb 2 oz (84.9 kg)     GEN:  Well nourished, well developed in no acute distress HEENT: Normal NECK: No JVD; No carotid bruits LYMPHATICS: No lymphadenopathy CARDIAC: RRR, no  murmurs, rubs, gallops RESPIRATORY:  Clear to auscultation without rales, wheezing or rhonchi  ABDOMEN: Soft, non-tender, non-distended MUSCULOSKELETAL:  No edema; No deformity  SKIN: Warm and dry NEUROLOGIC:  Alert and oriented x 3 PSYCHIATRIC:  Normal affect    Signed, Norman Herrlich, MD  09/12/2022 4:29 PM    [facility]

## 2022-09-12 ENCOUNTER — Ambulatory Visit: Payer: Medicare Other | Attending: Cardiology | Admitting: Cardiology

## 2022-09-12 ENCOUNTER — Encounter: Payer: Self-pay | Admitting: Cardiology

## 2022-09-12 VITALS — BP 148/76 | HR 74 | Ht 66.5 in | Wt 188.0 lb

## 2022-09-12 DIAGNOSIS — Z950 Presence of cardiac pacemaker: Secondary | ICD-10-CM

## 2022-09-12 DIAGNOSIS — I447 Left bundle-branch block, unspecified: Secondary | ICD-10-CM

## 2022-09-12 DIAGNOSIS — I442 Atrioventricular block, complete: Secondary | ICD-10-CM

## 2022-09-12 DIAGNOSIS — I10 Essential (primary) hypertension: Secondary | ICD-10-CM

## 2022-09-12 DIAGNOSIS — Z953 Presence of xenogenic heart valve: Secondary | ICD-10-CM | POA: Diagnosis not present

## 2022-09-12 DIAGNOSIS — E785 Hyperlipidemia, unspecified: Secondary | ICD-10-CM

## 2022-09-12 NOTE — Patient Instructions (Signed)
Medication Instructions:  Your physician recommends that you continue on your current medications as directed. Please refer to the Current Medication list given to you today.  *If you need a refill on your cardiac medications before your next appointment, please call your pharmacy*   Lab Work: None If you have labs (blood work) drawn today and your tests are completely normal, you will receive your results only by: MyChart Message (if you have MyChart) OR A paper copy in the mail If you have any lab test that is abnormal or we need to change your treatment, we will call you to review the results.   Testing/Procedures: None   Follow-Up: At [facility], you and your health needs are our priority.  As part of our continuing mission to provide you with exceptional heart care, we have created designated Provider Care Teams.  These Care Teams include your primary Cardiologist (physician) and Advanced Practice Providers (APPs -  Physician Assistants and Nurse Practitioners) who all work together to provide you with the care you need, when you need it.  We recommend signing up for the patient portal called "MyChart".  Sign up information is provided on this After Visit Summary.  MyChart is used to connect with patients for Virtual Visits (Telemedicine).  Patients are able to view lab/test results, encounter notes, upcoming appointments, etc.  Non-urgent messages can be sent to your provider as well.   To learn more about what you can do with MyChart, go to [email or web address].    Your next appointment:   1 year(s)  Provider:   Brian Munley, MD    Other Instructions None  

## 2022-09-17 ENCOUNTER — Ambulatory Visit: Payer: Medicare Other | Admitting: Family Medicine

## 2022-09-17 ENCOUNTER — Encounter: Payer: Self-pay | Admitting: Family Medicine

## 2022-09-17 VITALS — BP 137/77 | HR 69 | Temp 98.2°F | Resp 12 | Ht 66.5 in | Wt 185.4 lb

## 2022-09-17 DIAGNOSIS — J029 Acute pharyngitis, unspecified: Secondary | ICD-10-CM | POA: Diagnosis not present

## 2022-09-17 DIAGNOSIS — R059 Cough, unspecified: Secondary | ICD-10-CM

## 2022-09-17 DIAGNOSIS — J0140 Acute pansinusitis, unspecified: Secondary | ICD-10-CM

## 2022-09-17 DIAGNOSIS — J40 Bronchitis, not specified as acute or chronic: Secondary | ICD-10-CM

## 2022-09-17 DIAGNOSIS — R509 Fever, unspecified: Secondary | ICD-10-CM | POA: Diagnosis not present

## 2022-09-17 LAB — POCT INFLUENZA A/B
Influenza A, POC: NEGATIVE — AB
Influenza B, POC: NEGATIVE

## 2022-09-17 LAB — POCT RAPID STREP A (OFFICE): Rapid Strep A Screen: NEGATIVE

## 2022-09-17 MED ORDER — AMOXICILLIN-POT CLAVULANATE 875-125 MG PO TABS: 1.0000 | ORAL_TABLET | Freq: Two times a day (BID) | ORAL | 0 refills | Status: DC

## 2022-09-17 MED ORDER — FLUTICASONE PROPIONATE 50 MCG/ACT NA SUSP: 2.0000 | Freq: Every day | NASAL | 2 refills | Status: DC

## 2022-09-17 MED ORDER — PROMETHAZINE-DM 6.25-15 MG/5ML PO SYRP: 5.0000 mL | ORAL_SOLUTION | Freq: Four times a day (QID) | ORAL | 0 refills | Status: DC | PRN

## 2022-09-17 NOTE — Progress Notes (Signed)
Subjective:   By signing my name below, I, Eric [name], attest that this documentation has been prepared under the direction and in the presence of Donato Schultz, DO. 09/17/2022   Patient ID: Eric [name], male    DOB: Oct 26, 1945, 77 y.o.   MRN: [id number]  Chief Complaint  Patient presents with   Fever    Has been taking Mucinex    Cough   Sore Throat    HPI Patient is in today for office visit.pt has had fever as high as 102.2, sore throat and cough, dry.  Symptoms started Saturday.    Past Medical History:  Diagnosis Date   Full dentures    Heart murmur    History of aortic valve stenosis 2011   06/ 2011 dx mild bicuspid stenosis;    then 04/ 2019 severe  stenosis   History of COVID-19 04/23/2019   positive result in epic;  per pt asymptomatic   History of subarachnoid hemorrhage 04/27/2019   hospital admission in epic, dx bilateral frontal SAH w/ left occipital skull fx nondisplaced due to syncope w/ fall secondary to dehydration/ ortho hypotension   History of supraventricular tachycardia 12/2017   followed by cardiology   Hyperlipidemia    Hypertension    followed by cardiology and pcp   Hypothyroidism    followed by pcp   Left bundle branch block (LBBB) 12/18/2017   post op AV replacement 12-17-2017   Lower urinary tract symptoms (LUTS)    Macular edema of right eye    Dr  Stephannie Li    OA (osteoarthritis)    multiple sites , followed by dr s. Corliss Skains   Prostate cancer    urologist--- dr Annabell Howells--- dx 04/ 2022, Gleason 3+4, PSA 3.76   Rheumatoid arthritis 11/17/2018   rheumotologist-- dr s. 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 12/17/2017   Edwards Intuity Elite rapid deployment stented bovine pericardial tissue valve via right mini thoracotomy approach   SOB (shortness of breath)    per pt occasionally with yard work, but ok with stairs and normal activities   Spinal stenosis  of lumbar region 04/23/2018   Wide-complex tachycardia 12/30/2017   (followed by dr Dulce Sellar)  post cardioversion by EMS 12-30-2017 for SVT 170s post op AV replacement 12-17-2017 , CHF, AKI    Past Surgical History:  Procedure Laterality Date   AORTIC VALVE REPLACEMENT N/A 12/17/2017   Procedure: MINIMALLY INVASIVE AORTIC VALVE REPLACEMENT (AVR) [ using Edwards Intuity Valve Size 23mm;  Surgeon: Purcell Nails, MD;  Location: [facility] OR;  Service: Open Heart Surgery;  Laterality: N/A;  MINI THORACOTOMY   CARDIAC CATHETERIZATION  11/10/2000   @[facility]  by dr Graciela Husbands;  normal coronary angiography and normal LVF   CATARACT EXTRACTION W/ INTRAOCULAR LENS IMPLANT Right 2018   COLONOSCOPY     last one 02-29-2020 by dr h. danis   CYSTOSCOPY N/A 02/09/2021   Procedure: Bluford Kaufmann;  Surgeon: Bjorn Pippin, MD;  Location: [facility];  Service: Urology;  Laterality: N/A;   INGUINAL HERNIA REPAIR Right 2008   LAPAROSCOPIC CHOLECYSTECTOMY  06/26/2001   @[facility] ;  WITH OPEN LEFT INGUINAL HERNIA REPAIR   LUMBAR LAMINECTOMY/DECOMPRESSION MICRODISCECTOMY N/A 04/23/2018   Procedure: Microlumbar decompression Lumbar five-Sacral one;  Surgeon: Jene Every, MD;  Location: [facility] OR;  Service: Orthopedics;  Laterality: N/A;   PACEMAKER IMPLANT N/A 01/23/2022   Procedure: PACEMAKER IMPLANT;  Surgeon: Duke Salvia, MD;  Location: [facility]  INVASIVE CV LAB;  Service: Cardiovascular;  Laterality: N/A;   PROSTATE BIOPSY  09/20/2020   RADIOACTIVE SEED IMPLANT N/A 02/09/2021   Procedure: RADIOACTIVE SEED IMPLANT/BRACHYTHERAPY IMPLANT;  Surgeon: Bjorn Pippin, MD;  Location: [facility];  Service: Urology;  Laterality: N/A;  56 seeds   RIGHT/LEFT HEART CATH AND CORONARY ANGIOGRAPHY N/A 10/16/2017   Procedure: RIGHT/LEFT HEART CATH AND CORONARY ANGIOGRAPHY;  Surgeon: Tonny Bollman, MD;  Location: [facility] INVASIVE CV LAB;  Service: Cardiovascular;  Laterality: N/A;   SPACE OAR INSTILLATION N/A 02/09/2021   Procedure: SPACE  OAR INSTILLATION;  Surgeon: Bjorn Pippin, MD;  Location: [facility];  Service: Urology;  Laterality: N/A;   TEE WITHOUT CARDIOVERSION N/A 12/17/2017   Procedure: TRANSESOPHAGEAL ECHOCARDIOGRAM (TEE);  Surgeon: Purcell Nails, MD;  Location: [facility] OR;  Service: Open Heart Surgery;  Laterality: N/A;   TEMPORARY PACEMAKER N/A 01/17/2022   Procedure: TEMPORARY PACEMAKER;  Surgeon: Lyn Records, MD;  Location: [facility] INVASIVE CV LAB;  Service: Cardiovascular;  Laterality: N/A;   TONSILLECTOMY  1960    Family History  Problem Relation Age of Onset   Diabetes Mother    Stroke Mother    Diabetes Brother        ?   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    Comment: 05/2008  Tobacco Use   Smoking status: Former    Packs/day: 2.00    Years: 35.00    Additional pack years: 0.00    Total pack years: 70.00    Types: Cigarettes    Quit date: 04/15/1990    Years since quitting: 32.4    Passive exposure: Never   Smokeless tobacco: Never  Vaping Use   Vaping Use: Never used  Substance and Sexual Activity   Alcohol use: Not Currently    Comment: no ETOH since the 90s   Drug use: Never   Sexual activity: Not Currently  Other Topics Concern   Not on file  Social History Narrative   ** Merged History Encounter **       Lives w/ wife 3 children, 8 g-kids        Social Determinants of Health   Financial Resource Strain: Low Risk  (07/09/2021)   Overall  Financial Resource Strain (CARDIA)    Difficulty of Paying Living Expenses: Not hard at all  Food Insecurity: No Food Insecurity (07/09/2021)   Hunger Vital Sign    Worried About Running Out of Food in the Last Year: Never true    Ran Out of Food in the Last Year: Never true  Transportation Needs: No Transportation Needs (07/09/2021)   PRAPARE - Administrator, Civil Service (Medical): No    Lack of Transportation (Non-Medical): No  Physical  Activity: Inactive (07/09/2021)   Exercise Vital Sign    Days of Exercise per Week: 0 days    Minutes of Exercise per Session: 0 min  Stress: No Stress Concern Present (07/09/2021)   [employer] of Occupational Health - Occupational Stress Questionnaire    Feeling of Stress : Not at all  Social Connections: Moderately Integrated (07/09/2021)   Social Connection and Isolation Panel [NHANES]    Frequency of Communication with Friends and Family: More than three times a week    Frequency of Social Gatherings with Friends and Family: Three times a week    Attends Religious Services: More than 4 times per year    Active Member of Clubs or Organizations: No    Attends Banker Meetings: Never    Marital Status: Married  Catering manager Violence: Not At Risk (07/09/2021)   Humiliation, Afraid, Rape, and Kick questionnaire    Fear of Current or Ex-Partner: No    Emotionally Abused: No    Physically Abused: No    Sexually Abused: No    Outpatient Medications Prior to Visit  Medication Sig Dispense Refill   acetaminophen (TYLENOL) 500 MG tablet Take 500 mg by mouth every 6 (six) hours as needed for mild pain or headache.     aspirin EC 81 MG tablet Take 81 mg by mouth daily.     carvedilol (COREG) 12.5 MG tablet TAKE 1 TABLET (12.5MG  TOTAL) BY MOUTH TWICE A DAY WITH MEALS 180 tablet 0   Cholecalciferol (VITAMIN D3) 50 MCG (2000 UT) TABS Take 2,000 Units by mouth daily.     folic acid (FOLVITE) 1 MG tablet Take 1 mg by mouth  daily.     levothyroxine (SYNTHROID) 88 MCG tablet Take 1 tablet (88 mcg total) by mouth daily before breakfast. 90 tablet 1   methotrexate (RHEUMATREX) 2.5 MG tablet Take 3 tablets (7.5 mg total) by mouth once a week. 36 tablet 0   rosuvastatin (CRESTOR) 20 MG tablet Take 1 tablet (20 mg total) by mouth at bedtime. 90 tablet 1   No facility-administered medications prior to visit.    Allergies  Allergen Reactions   Atorvastatin Other (See Comments)    REACTION: myalgia   Ezetimibe Other (See Comments)    REACTION: myalgia   Lipitor [Atorvastatin] Other (See Comments)    Myalgia    Zetia [Ezetimibe] Other (See Comments)    Myalgia     Review of Systems  Constitutional:  Negative for fever and malaise/fatigue.  HENT:  Positive for sinus pain and sore throat. Negative for congestion.   Eyes:  Negative for blurred vision.  Respiratory:  Positive for cough and sputum production. Negative for shortness of breath and wheezing.   Cardiovascular:  Negative for chest pain, palpitations and leg swelling.  Gastrointestinal:  Negative for vomiting.  Musculoskeletal:  Negative for back pain.  Skin:  Negative for rash.  Neurological:  Negative for loss of consciousness and headaches.       Objective:    Physical Exam Vitals and nursing note reviewed.  Constitutional:      Appearance: He is well-developed.  HENT:     Head: Normocephalic and atraumatic.     Nose: Congestion present.     Right Sinus: Maxillary sinus tenderness and frontal sinus tenderness present.     Left Sinus: Maxillary sinus tenderness and frontal sinus tenderness present.     Mouth/Throat:     Pharynx: Posterior oropharyngeal erythema present. No oropharyngeal exudate.  Eyes:  Pupils: Pupils are equal, round, and reactive to light.  Neck:     Thyroid: No thyromegaly.  Cardiovascular:     Rate and Rhythm: Normal rate and regular rhythm.     Heart sounds: No murmur heard. Pulmonary:     Effort: Pulmonary  effort is normal. No respiratory distress.     Breath sounds: Normal breath sounds. No wheezing or rales.  Chest:     Chest wall: No tenderness.  Abdominal:     Palpations: Abdomen is soft.  Musculoskeletal:     Cervical back: Normal range of motion and neck supple.     Right hip: Tenderness present. Normal range of motion. Normal strength.     Left hip: Tenderness present. Normal range of motion. Normal strength.     Right foot: Bony tenderness present. No swelling.     Left foot: Bony tenderness present. No swelling.  Lymphadenopathy:     Cervical: Cervical adenopathy present.  Skin:    General: Skin is warm and dry.  Neurological:     General: No focal deficit present.     Mental Status: He is alert and oriented to person, place, and time.  Psychiatric:        Mood and Affect: Mood normal.        Behavior: Behavior normal.        Thought Content: Thought content normal.        Judgment: Judgment normal.     BP 137/77 (BP Location: Right Arm, Cuff Size: Large)   Pulse 69   Temp 98.2 F (36.8 C) (Oral)   Resp 12   Ht 5' 6.5" (1.689 m)   Wt 185 lb 6.4 oz (84.1 kg)   SpO2 99%   BMI 29.48 kg/m  Wt Readings from Last 3 Encounters:  09/17/22 185 lb 6.4 oz (84.1 kg)  09/12/22 188 lb (85.3 kg)  08/19/22 187 lb (84.8 kg)       Assessment & Plan:  Acute non-recurrent pansinusitis Assessment & Plan: Augmentin 875 mg bid x 10 days  Flonase    Orders: -     Amoxicillin-Pot Clavulanate; Take 1 tablet by mouth 2 (two) times daily.  Dispense: 20 tablet; Refill: 0 -     Promethazine-DM; Take 5 mLs by mouth 4 (four) times daily as needed.  Dispense: 118 mL; Refill: 0 -     Fluticasone Propionate; Place 2 sprays into both nostrils daily.  Dispense: 16 g; Refill: 2  Cough, unspecified type -     POCT Influenza A/B -     POCT rapid strep A -     Amoxicillin-Pot Clavulanate; Take 1 tablet by mouth 2 (two) times daily.  Dispense: 20 tablet; Refill: 0 -     Promethazine-DM; Take  5 mLs by mouth 4 (four) times daily as needed.  Dispense: 118 mL; Refill: 0 -     Fluticasone Propionate; Place 2 sprays into both nostrils daily.  Dispense: 16 g; Refill: 2  Fever, unspecified fever cause -     POCT Influenza A/B -     POCT rapid strep A  Sore throat -     POCT Influenza A/B -     POCT rapid strep A  Bronchitis Assessment & Plan: Augmentin bid x 10 days  Phenergan dm for night time Mucinex for day cough  Orders: -     Amoxicillin-Pot Clavulanate; Take 1 tablet by mouth 2 (two) times daily.  Dispense: 20 tablet; Refill: 0 -  Promethazine-DM; Take 5 mLs by mouth 4 (four) times daily as needed.  Dispense: 118 mL; Refill: 0 -     Fluticasone Propionate; Place 2 sprays into both nostrils daily.  Dispense: 16 g; Refill: 2    I, Donato SchultzYvonne R Lowne Chase, DO, personally preformed the services described in this documentation.  All medical record entries made by the scribe were at my direction and in my presence.  I have reviewed the chart and discharge instructions (if applicable) and agree that the record reflects my personal performance and is accurate and complete. 09/17/2022  Donato SchultzYvonne R Lowne Chase, DO   Dennis BastI,Zakirra M Oliver,acting as a scribe for Donato SchultzYvonne R Lowne Chase, DO.,have documented all relevant documentation on the behalf of Donato SchultzYvonne R Lowne Chase, DO,as directed by  Donato SchultzYvonne R Lowne Chase, DO while in the presence of Donato SchultzYvonne R Lowne Chase, DO.

## 2022-09-17 NOTE — Assessment & Plan Note (Signed)
Augmentin 875 mg bid x 10 days  Flonase

## 2022-09-17 NOTE — Patient Instructions (Signed)

## 2022-09-17 NOTE — Assessment & Plan Note (Signed)
Augmentin bid x 10 days  Phenergan dm for night time Mucinex for day cough

## 2022-10-22 ENCOUNTER — Other Ambulatory Visit: Payer: Self-pay | Admitting: Internal Medicine

## 2022-10-24 ENCOUNTER — Ambulatory Visit (INDEPENDENT_AMBULATORY_CARE_PROVIDER_SITE_OTHER): Payer: Medicare Other

## 2022-10-24 DIAGNOSIS — I442 Atrioventricular block, complete: Secondary | ICD-10-CM

## 2022-10-24 LAB — CUP PACEART REMOTE DEVICE CHECK
Battery Remaining Longevity: 112 mo
Battery Remaining Percentage: 95.5 %
Battery Voltage: 3.02 V
Brady Statistic AP VP Percent: 1 %
Brady Statistic AP VS Percent: 56 %
Brady Statistic AS VP Percent: 1 %
Brady Statistic AS VS Percent: 44 %
Brady Statistic RA Percent Paced: 55 %
Brady Statistic RV Percent Paced: 1 %
Date Time Interrogation Session: 20240516020013
Implantable Lead Connection Status: 753985
Implantable Lead Connection Status: 753985
Implantable Lead Implant Date: 20230816
Implantable Lead Implant Date: 20230816
Implantable Lead Location: 753859
Implantable Lead Location: 753860
Implantable Lead Model: 3830
Implantable Pulse Generator Implant Date: 20230816
Lead Channel Impedance Value: 510 Ohm
Lead Channel Impedance Value: 600 Ohm
Lead Channel Pacing Threshold Amplitude: 0.5 V
Lead Channel Pacing Threshold Amplitude: 1 V
Lead Channel Pacing Threshold Pulse Width: 0.5 ms
Lead Channel Pacing Threshold Pulse Width: 0.5 ms
Lead Channel Sensing Intrinsic Amplitude: 1.2 mV
Lead Channel Sensing Intrinsic Amplitude: 12 mV
Lead Channel Setting Pacing Amplitude: 2.5 V
Lead Channel Setting Pacing Amplitude: 2.5 V
Lead Channel Setting Pacing Pulse Width: 0.5 ms
Lead Channel Setting Sensing Sensitivity: 2 mV
Pulse Gen Model: 2272
Pulse Gen Serial Number: 8101539

## 2022-10-25 ENCOUNTER — Other Ambulatory Visit: Payer: Self-pay | Admitting: Cardiology

## 2022-10-25 ENCOUNTER — Encounter: Payer: Self-pay | Admitting: Internal Medicine

## 2022-10-25 ENCOUNTER — Ambulatory Visit: Payer: Medicare Other | Admitting: Internal Medicine

## 2022-10-25 VITALS — BP 132/84 | HR 61 | Temp 97.9°F | Resp 16 | Ht 66.5 in | Wt 185.5 lb

## 2022-10-25 DIAGNOSIS — I10 Essential (primary) hypertension: Secondary | ICD-10-CM

## 2022-10-25 DIAGNOSIS — E785 Hyperlipidemia, unspecified: Secondary | ICD-10-CM

## 2022-10-25 LAB — LIPID PANEL
Cholesterol: 146 mg/dL (ref 0–200)
HDL: 48.8 mg/dL (ref >39.00)
LDL Cholesterol: 70 mg/dL (ref 0–99)
NonHDL: 96.77
Total CHOL/HDL Ratio: 3
Triglycerides: 133 mg/dL (ref 0.0–149.0)
VLDL: 26.6 mg/dL (ref 0.0–40.0)

## 2022-10-25 NOTE — Telephone Encounter (Signed)
Refills to pharmacy 

## 2022-10-25 NOTE — Patient Instructions (Addendum)
Vaccines I recommend: Shingrix (shingles) Covid booster RSV Flu shot this fall     GO TO THE LAB : Get the blood work     GO TO THE FRONT DESK, PLEASE SCHEDULE YOUR APPOINTMENTS Come back for   checkup in 6 months

## 2022-10-25 NOTE — Assessment & Plan Note (Signed)
HTN: Per cards note h/o orthostatic hypotension, they did not recommend a very tight BP control.  Continue carvedilol. High cholesterol: Since last office visit, rosuvastatin was increased to 20 mg, recent LFTs normal, check FLP. Vascular: Cardiology [facility] 09/12/2022, he noted to be stable. RA: [facility] rheumatology 08/19/2022. Thoracic pain: In dermatomal distribution, left side.  Compared to few months ago is actually getting less frequent and less intense.  I ordered a thoracic x-ray at [facility] and it was normal with no blastic lesions or red flags.  D/w pt Ortho referral, he is not too bothered by it.  We agreed on observation. Vaccine advice provided RTC 6 months

## 2022-10-25 NOTE — Progress Notes (Signed)
Subjective:    Patient ID: Eric [name], male    DOB: 1946/03/25, 77 y.o.   MRN: [id number]  DOS:  10/25/2022 Type of visit - description: Routine checkup  In general feeling well. Denies fever chills No chest pain no difficulty breathing See LOV, still has a  stinging feeling at the left thorax in a dermatomal distribution.  No rash.  Review of Systems See above   Past Medical History:  Diagnosis Date   Full dentures    Heart murmur    History of aortic valve stenosis 2011   06/ 2011 dx mild bicuspid stenosis;    then 04/ 2019 severe  stenosis   History of COVID-19 04/23/2019   positive result in epic;  per pt asymptomatic   History of subarachnoid hemorrhage 04/27/2019   hospital admission in epic, dx bilateral frontal SAH w/ left occipital skull fx nondisplaced due to syncope w/ fall secondary to dehydration/ ortho hypotension   History of supraventricular tachycardia 12/2017   followed by cardiology   Hyperlipidemia    Hypertension    followed by cardiology and pcp   Hypothyroidism    followed by pcp   Left bundle branch block (LBBB) 12/18/2017   post op AV replacement 12-17-2017   Lower urinary tract symptoms (LUTS)    Macular edema of right eye    Dr  Stephannie Li    OA (osteoarthritis)    multiple sites , followed by dr s. Corliss Skains   Prostate cancer [facility])    urologist--- dr Annabell Howells--- dx 04/ 2022, Gleason 3+4, PSA 3.76   Rheumatoid arthritis (HCC) 11/17/2018   rheumotologist-- dr s. 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 12/17/2017   Edwards Intuity Elite rapid deployment stented bovine pericardial tissue valve via right mini thoracotomy approach   SOB (shortness of breath)    per pt occasionally with yard work, but ok with stairs and normal activities   Spinal stenosis of lumbar region 04/23/2018   Wide-complex tachycardia 12/30/2017   (followed by dr Dulce Sellar)  post cardioversion by EMS  12-30-2017 for SVT 170s post op AV replacement 12-17-2017 , CHF, AKI    Past Surgical History:  Procedure Laterality Date   AORTIC VALVE REPLACEMENT N/A 12/17/2017   Procedure: MINIMALLY INVASIVE AORTIC VALVE REPLACEMENT (AVR) [ using Edwards Intuity Valve Size 23mm;  Surgeon: Purcell Nails, MD;  Location: [facility] OR;  Service: Open Heart Surgery;  Laterality: N/A;  MINI THORACOTOMY   CARDIAC CATHETERIZATION  11/10/2000   @[facility]  by dr Graciela Husbands;  normal coronary angiography and normal LVF   CATARACT EXTRACTION W/ INTRAOCULAR LENS IMPLANT Right 2018   COLONOSCOPY     last one 02-29-2020 by dr h. danis   CYSTOSCOPY N/A 02/09/2021   Procedure: Bluford Kaufmann;  Surgeon: Bjorn Pippin, MD;  Location: [facility];  Service: Urology;  Laterality: N/A;   INGUINAL HERNIA REPAIR Right 2008   LAPAROSCOPIC CHOLECYSTECTOMY  06/26/2001   @[facility] ;  WITH OPEN LEFT INGUINAL HERNIA REPAIR   LUMBAR LAMINECTOMY/DECOMPRESSION MICRODISCECTOMY N/A 04/23/2018   Procedure: Microlumbar decompression Lumbar five-Sacral one;  Surgeon: Jene Every, MD;  Location: [facility] OR;  Service: Orthopedics;  Laterality: N/A;   PACEMAKER IMPLANT N/A 01/23/2022   Procedure: PACEMAKER IMPLANT;  Surgeon: Duke Salvia, MD;  Location: [facility] INVASIVE CV LAB;  Service: Cardiovascular;  Laterality: N/A;   PROSTATE BIOPSY  09/20/2020   RADIOACTIVE SEED IMPLANT N/A 02/09/2021   Procedure: RADIOACTIVE SEED IMPLANT/BRACHYTHERAPY IMPLANT;  Surgeon: Annabell Howells,  Jonny Ruiz, MD;  Location: [facility];  Service: Urology;  Laterality: N/A;  56 seeds   RIGHT/LEFT HEART CATH AND CORONARY ANGIOGRAPHY N/A 10/16/2017   Procedure: RIGHT/LEFT HEART CATH AND CORONARY ANGIOGRAPHY;  Surgeon: Tonny Bollman, MD;  Location: [facility] INVASIVE CV LAB;  Service: Cardiovascular;  Laterality: N/A;   SPACE OAR INSTILLATION N/A 02/09/2021   Procedure: SPACE OAR INSTILLATION;  Surgeon: Bjorn Pippin, MD;  Location: [facility];  Service: Urology;  Laterality: N/A;    TEE WITHOUT CARDIOVERSION N/A 12/17/2017   Procedure: TRANSESOPHAGEAL ECHOCARDIOGRAM (TEE);  Surgeon: Purcell Nails, MD;  Location: [facility] OR;  Service: Open Heart Surgery;  Laterality: N/A;   TEMPORARY PACEMAKER N/A 01/17/2022   Procedure: TEMPORARY PACEMAKER;  Surgeon: Lyn Records, MD;  Location: [facility] INVASIVE CV LAB;  Service: Cardiovascular;  Laterality: N/A;   TONSILLECTOMY  1960    Current Outpatient Medications  Medication Instructions   acetaminophen (TYLENOL) 500 mg, Oral, Every 6 hours PRN   amoxicillin-clavulanate (AUGMENTIN) 875-125 MG tablet 1 tablet, Oral, 2 times daily   aspirin EC 81 mg, Oral, Daily   carvedilol (COREG) 12.5 MG tablet TAKE 1 TABLET (12.5MG  TOTAL) BY MOUTH TWICE A DAY WITH MEALS   fluticasone (FLONASE) 50 MCG/ACT nasal spray 2 sprays, Each Nare, Daily   folic acid (FOLVITE) 1 mg, Oral, Daily   levothyroxine (SYNTHROID) 88 mcg, Oral, Daily before breakfast   methotrexate (RHEUMATREX) 7.5 mg, Oral, Weekly   promethazine-dextromethorphan (PROMETHAZINE-DM) 6.25-15 MG/5ML syrup 5 mLs, Oral, 4 times daily PRN   rosuvastatin (CRESTOR) 20 mg, Oral, Daily at bedtime   Vitamin D3 2,000 Units, Oral, Daily       Objective:   Physical Exam BP 132/84   Pulse 61   Temp 97.9 F (36.6 C) (Oral)   Resp 16   Ht 5' 6.5" (1.689 m)   Wt 185 lb 8 oz (84.1 kg)   SpO2 97%   BMI 29.49 kg/m  General:   Well developed, NAD, BMI noted. HEENT:  Normocephalic . Face symmetric, atraumatic Lungs:  CTA B Normal respiratory effort, no intercostal retractions, no accessory muscle use. Heart: RRR,  no murmur.  Lower extremities: no pretibial edema bilaterally MSK: No TTP of the thoracic spine Skin: Not pale. Not jaundice Neurologic:  alert & oriented X3.  Speech normal, gait appropriate for age and unassisted Psych--  Cognition and judgment appear intact.  Cooperative with normal attention span and concentration.  Behavior appropriate. No anxious or depressed  appearing.      Assessment     Assessment HTN Hyperlipidemia Hypothyroidism R.A. dx 2020 DJD Increase LFTs Ultrasound 2002 done for increased LFTs: Normal liver; Hep C negative 02-16-15 CV: --Aortic stenosis , dx  2012 , s/p AoVR 12/2017 --Fusiform infrarenal abdominal aortic ectasis: -- FF,  Brother has CAD dx age 11s --Patient himself has a cardiac catheterization with minimal to no disease 10/2017 Macular degeneration, cataracts  Subarachnoid hemorrhage, left orbital contusion, left occipital fracture 2020 Prostate cancer, Dx 09-2020 Microscopic hematuria: Had a cystoscopy and a CT at urology  PLAN HTN: Per cards note h/o orthostatic hypotension, they did not recommend a very tight BP control.  Continue carvedilol. High cholesterol: Since last office visit, rosuvastatin was increased to 20 mg, recent LFTs normal, check FLP. Vascular: Cardiology OV 09/12/2022, he noted to be stable. RA: [facility] rheumatology 08/19/2022. Thoracic pain: In dermatomal distribution, left side.  Compared to few months ago is actually getting less frequent and less intense.  I ordered a  thoracic x-ray at [facility] and it was normal with no blastic lesions or red flags.  D/w pt Ortho referral, he is not too bothered by it.  We agreed on observation. Vaccine advice provided RTC 6 months

## 2022-11-07 NOTE — Progress Notes (Signed)
Remote pacemaker transmission.   

## 2022-11-13 ENCOUNTER — Other Ambulatory Visit: Payer: Self-pay | Admitting: Rheumatology

## 2022-11-13 NOTE — Telephone Encounter (Signed)
Last Fill: 11/19/2021  Next Visit: 01/30/2023  Last Visit: 08/19/2022  Dx: Seronegative rheumatoid arthritis   Current Dose per office note on 08/19/2022: folic acid 1 mg po daily   Okay to refill Folic Acid?

## 2022-11-18 ENCOUNTER — Other Ambulatory Visit: Payer: Self-pay | Admitting: *Deleted

## 2022-11-18 DIAGNOSIS — Z79899 Other long term (current) drug therapy: Secondary | ICD-10-CM

## 2022-11-18 LAB — CBC WITH DIFFERENTIAL/PLATELET
Absolute Monocytes: 512 cells/uL (ref 200–950)
Basophils Absolute: 9 cells/uL (ref 0–200)
Basophils Relative: 0.2 %
Eosinophils Relative: 0.6 %
Neutro Abs: 2961 cells/uL (ref 1500–7800)
WBC: 4.7 10*3/uL (ref 3.8–10.8)

## 2022-11-19 LAB — COMPLETE METABOLIC PANEL WITH GFR
AG Ratio: 1.8 (calc) (ref 1.0–2.5)
ALT: 25 U/L (ref 9–46)
AST: 30 U/L (ref 10–35)
Albumin: 4.5 g/dL (ref 3.6–5.1)
Alkaline phosphatase (APISO): 72 U/L (ref 35–144)
BUN: 10 mg/dL (ref 7–25)
CO2: 30 mmol/L (ref 20–32)
Calcium: 9.4 mg/dL (ref 8.6–10.3)
Chloride: 104 mmol/L (ref 98–110)
Creat: 1.16 mg/dL (ref 0.70–1.28)
Globulin: 2.5 g/dL (calc) (ref 1.9–3.7)
Glucose, Bld: 98 mg/dL (ref 65–99)
Potassium: 4.5 mmol/L (ref 3.5–5.3)
Sodium: 140 mmol/L (ref 135–146)
Total Bilirubin: 1.1 mg/dL (ref 0.2–1.2)
Total Protein: 7 g/dL (ref 6.1–8.1)
eGFR: 65 mL/min/{1.73_m2} (ref >=60)

## 2022-11-19 LAB — CBC WITH DIFFERENTIAL/PLATELET
Eosinophils Absolute: 28 cells/uL (ref 15–500)
HCT: 41.2 % (ref 38.5–50.0)
Hemoglobin: 13.6 g/dL (ref 13.2–17.1)
Lymphs Abs: 1189 cells/uL (ref 850–3900)
MCH: 31.4 pg (ref 27.0–33.0)
MCHC: 33 g/dL (ref 32.0–36.0)
MCV: 95.2 fL (ref 80.0–100.0)
MPV: 9.7 fL (ref 7.5–12.5)
Monocytes Relative: 10.9 %
Neutrophils Relative %: 63 %
Platelets: 182 10*3/uL (ref 140–400)
RBC: 4.33 10*6/uL (ref 4.20–5.80)
RDW: 13.2 % (ref 11.0–15.0)
Total Lymphocyte: 25.3 %

## 2022-11-19 NOTE — Progress Notes (Signed)
CBC and CMP are normal.

## 2022-12-13 ENCOUNTER — Other Ambulatory Visit: Payer: Self-pay | Admitting: Family Medicine

## 2022-12-13 DIAGNOSIS — J40 Bronchitis, not specified as acute or chronic: Secondary | ICD-10-CM

## 2022-12-13 DIAGNOSIS — R059 Cough, unspecified: Secondary | ICD-10-CM

## 2022-12-13 DIAGNOSIS — J0140 Acute pansinusitis, unspecified: Secondary | ICD-10-CM

## 2022-12-19 ENCOUNTER — Other Ambulatory Visit: Payer: Self-pay | Admitting: Physician Assistant

## 2022-12-19 NOTE — Telephone Encounter (Signed)
Please clarify if he is taking 4 or 3 tablets of methotrexate?

## 2022-12-19 NOTE — Telephone Encounter (Signed)
Last Fill: 07/23/2022  Labs: 11/18/2022 CBC and CMP are normal.   Next Visit: 01/30/2023  Last Visit: 08/19/2022  DX: Seronegative rheumatoid arthritis   Current Dose per office note 08/19/2022: Methotrexate 4 tablets by mouth once weekly   Okay to refill Methotrexate?

## 2022-12-19 NOTE — Telephone Encounter (Signed)
Spoke with patient and he states he is taking MTX 3 tabs weekly.

## 2023-01-01 ENCOUNTER — Other Ambulatory Visit: Payer: Self-pay | Admitting: Internal Medicine

## 2023-01-20 NOTE — Progress Notes (Unsigned)
Office Visit Note  Patient: Eric [name]             Date of Birth: 23-Jan-1946           MRN: [id number]             PCP: Wanda Plump, MD Referring: Wanda Plump, MD Visit Date: 01/30/2023 Occupation: @GUAROCC @  Subjective:  Medication monitoring   History of Present Illness: Eric [name] is a 77 y.o. male with history of seronegative rheumatoid arthritis and osteoarthritis.  Patient remains on Methotrexate 3 tablets by mouth once weekly and folic acid 1 mg po daily.  He is tolerating methotrexate without any side effects and has not missed any doses recently.  He experiences intermittent stiffness in both hands but denies any joint swelling.  He has not had any nocturnal pain.  He denies any difficulty performing ADLs.  Patient states that his dose of Crestor was recently increased and he has noticed some increased muscle cramping and spasms especially at night.  He denies any other new concerns.  He denies any other new medical conditions.  He has not had any recent or recurrent infections.   Activities of Daily Living:  Patient reports morning stiffness for 0 minutes.   Patient Denies nocturnal pain.  Difficulty dressing/grooming: Denies Difficulty climbing stairs: Denies Difficulty getting out of chair: Denies Difficulty using hands for taps, buttons, cutlery, and/or writing: Denies  Review of Systems  Constitutional:  Negative for fatigue.  HENT:  Negative for mouth sores and mouth dryness.   Eyes:  Negative for dryness.  Cardiovascular:  Negative for chest pain and palpitations.  Gastrointestinal:  Negative for blood in stool, constipation and diarrhea.  Endocrine: Negative for increased urination.  Genitourinary:  Negative for involuntary urination.  Musculoskeletal:  Positive for joint pain, joint pain, joint swelling, myalgias, muscle weakness, muscle tenderness and myalgias. Negative for gait problem and morning stiffness.  Skin:  Negative for color change, rash, hair  loss and sensitivity to sunlight.  Allergic/Immunologic: Negative for susceptible to infections.  Neurological:  Positive for headaches. Negative for dizziness.  Hematological:  Negative for swollen glands.  Psychiatric/Behavioral:  Negative for depressed mood and sleep disturbance. The patient is not nervous/anxious.     PMFS History:  Patient Active Problem List   Diagnosis Date Noted   Pacemaker - STJ 05/28/2022   Complete heart block (HCC) 01/17/2022   Heart block AV complete (HCC)    Cardiac arrest (HCC)    Cardiogenic shock (HCC)    Malignant neoplasm of prostate (HCC) 10/24/2020   Lumbar stenosis    Heart murmur    GERD (gastroesophageal reflux disease)    Dyspnea    Cataract    Arthritis    Anxiety    Body mass index (BMI) 30.0-30.9, adult 06/23/2019   Subdural hematoma (HCC) 05/05/2019   Subarachnoid hemorrhage (HCC) 04/28/2019   History of COVID-19 04/23/2019   Rheumatoid arthritis (HCC) 11/17/2018   Spinal stenosis of lumbar region 04/23/2018   HNP (herniated nucleus pulposus), lumbar 04/23/2018   Prolapsed lumbar disc 03/03/2018   Wide-complex tachycardia 12/30/2017   Left bundle branch block (LBBB) 12/18/2017   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 12/17/2017   Lower urinary tract symptoms (LUTS) 12/2017   Abnormal LFTs    Macular edema    PCP NOTES >>>>>> 04/27/2015   Hyperglycemia 10/20/2014   Dizziness 04/21/2014   Annual physical exam 04/15/2011   Aortic stenosis 04/15/2011   History of aortic  valve stenosis 2011   Hypothyroidism 10/13/2007   Hyperlipidemia 10/13/2007   Hypertension 04/08/2007    Past Medical History:  Diagnosis Date   Full dentures    Heart murmur    History of aortic valve stenosis 2011   06/ 2011 dx mild bicuspid stenosis;    then 04/ 2019 severe  stenosis   History of COVID-19 04/23/2019   positive result in epic;  per pt asymptomatic   History of subarachnoid hemorrhage 04/27/2019    hospital admission in epic, dx bilateral frontal SAH w/ left occipital skull fx nondisplaced due to syncope w/ fall secondary to dehydration/ ortho hypotension   History of supraventricular tachycardia 12/2017   followed by cardiology   Hyperlipidemia    Hypertension    followed by cardiology and pcp   Hypothyroidism    followed by pcp   Left bundle branch block (LBBB) 12/18/2017   post op AV replacement 12-17-2017   Lower urinary tract symptoms (LUTS)    Macular edema of right eye    Dr  Stephannie Li    OA (osteoarthritis)    multiple sites , followed by dr s. Corliss Skains   Prostate cancer [facility])    urologist--- dr Annabell Howells--- dx 04/ 2022, Gleason 3+4, PSA 3.76   Rheumatoid arthritis (HCC) 11/17/2018   rheumotologist-- dr s. 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   Coronary artery disease Brother        Male 1st degree relative >50, had CABG in his 73s   Emphysema Father    COPD Father    Diabetes Daughter    Colon cancer Neg Hx    Prostate cancer Neg Hx    Colon polyps Neg Hx    Esophageal cancer Neg Hx    Rectal cancer Neg Hx    Stomach cancer Neg Hx    Past Surgical History:  Procedure Laterality Date   AORTIC VALVE REPLACEMENT N/A 12/17/2017   Procedure: MINIMALLY INVASIVE AORTIC VALVE REPLACEMENT (AVR) [ using Edwards Intuity Valve Size 23mm;  Surgeon: Purcell Nails, MD;  Location: [facility] OR;  Service: Open Heart Surgery;  Laterality: N/A;  MINI THORACOTOMY   CARDIAC CATHETERIZATION  11/10/2000   @[facility]  by dr Graciela Husbands;  normal coronary angiography and normal LVF   CATARACT EXTRACTION W/ INTRAOCULAR LENS IMPLANT Right 2018   COLONOSCOPY     last one 02-29-2020 by dr h. danis   CYSTOSCOPY N/A 02/09/2021   Procedure: Bluford Kaufmann;  Surgeon: Bjorn Pippin, MD;  Location: [facility];  Service: Urology;  Laterality: N/A;   INGUINAL HERNIA REPAIR Right 2008   LAPAROSCOPIC CHOLECYSTECTOMY  06/26/2001   @[facility] ;  WITH OPEN LEFT INGUINAL HERNIA REPAIR   LUMBAR LAMINECTOMY/DECOMPRESSION MICRODISCECTOMY N/A 04/23/2018   Procedure: Microlumbar decompression Lumbar five-Sacral one;  Surgeon: Jene Every, MD;  Location: [facility] OR;  Service: Orthopedics;  Laterality: N/A;   PACEMAKER IMPLANT N/A 01/23/2022   Procedure: PACEMAKER IMPLANT;  Surgeon: Duke Salvia, MD;  Location: [facility] INVASIVE CV LAB;  Service: Cardiovascular;  Laterality: N/A;   PROSTATE BIOPSY  09/20/2020   RADIOACTIVE SEED IMPLANT N/A 02/09/2021   Procedure: RADIOACTIVE SEED IMPLANT/BRACHYTHERAPY IMPLANT;  Surgeon: Bjorn Pippin, MD;  Location: [facility];  Service: Urology;  Laterality: N/A;  56 seeds   RIGHT/LEFT HEART CATH AND CORONARY ANGIOGRAPHY N/A 10/16/2017   Procedure: RIGHT/LEFT HEART CATH AND CORONARY ANGIOGRAPHY;  Surgeon: Tonny Bollman, MD;  Location: [facility] INVASIVE CV LAB;  Service: Cardiovascular;  Laterality: N/A;   SPACE OAR INSTILLATION N/A 02/09/2021   Procedure: SPACE OAR INSTILLATION;  Surgeon: Bjorn Pippin, MD;  Location: [facility];  Service: Urology;  Laterality: N/A;   TEE WITHOUT CARDIOVERSION N/A 12/17/2017   Procedure: TRANSESOPHAGEAL ECHOCARDIOGRAM (TEE);  Surgeon: Purcell Nails, MD;  Location: [facility] OR;  Service: Open Heart Surgery;  Laterality: N/A;   TEMPORARY PACEMAKER N/A 01/17/2022   Procedure: TEMPORARY PACEMAKER;  Surgeon: Lyn Records,  MD;  Location: [facility] INVASIVE CV LAB;  Service: Cardiovascular;  Laterality: N/A;   TONSILLECTOMY  1960   Social History   Social History Narrative   ** Merged History Encounter **       Lives w/ wife 3 children, 8 g-kids        Immunization History  Administered Date(s) Administered   Fluad Quad(high Dose 65+) 02/12/2019, 03/14/2020, 05/15/2021, 03/14/2022   Influenza Split 03/11/2013, 02/17/2014   Influenza Whole 04/13/2007, 03/15/2008, 03/10/2012   Influenza, High Dose Seasonal PF 04/04/2015, 03/28/2016, 03/18/2017, 03/16/2018   PFIZER(Purple Top)SARS-COV-2 Vaccination 08/08/2019, 08/31/2019, 03/24/2020   PNEUMOCOCCAL CONJUGATE-20 06/26/2021   Pneumococcal Conjugate-13 04/21/2014   Pneumococcal Polysaccharide-23 04/15/2012   Td 06/10/1996, 06/10/2000   Tdap 04/15/2011, 10/11/2018   Zoster, Live 03/29/2015     Objective: Vital Signs: BP (!) 157/85 (BP Location: Left Arm, Patient Position: Sitting, Cuff Size: Large)   Pulse 61   Resp 15   Ht 5' 7.5" (1.715 m)   Wt 187 lb 12.8 oz (85.2 kg)   BMI 28.98 kg/m    Physical Exam Vitals and nursing note reviewed.  Constitutional:      Appearance: He is well-developed.  HENT:     Head: Normocephalic and atraumatic.  Eyes:     Conjunctiva/sclera: Conjunctivae normal.     Pupils: Pupils are equal, round, and reactive to light.  Cardiovascular:     Rate and Rhythm: Normal rate and regular rhythm.     Heart sounds: Normal heart sounds.  Pulmonary:     Effort: Pulmonary effort is normal.     Breath sounds: Normal breath sounds.  Abdominal:     General: Bowel sounds are normal.     Palpations: Abdomen is soft.  Musculoskeletal:     Cervical back: Normal range of motion and neck supple.  Skin:    General: Skin is warm and dry.     Capillary Refill: Capillary refill takes less than 2 seconds.  Neurological:     Mental Status: He is alert and oriented to person, place, and time.  Psychiatric:        Behavior: Behavior  normal.      Musculoskeletal Exam: C-spine has slightly limited range of motion with lateral rotation.  No midline spinal tenderness.  No SI joint tenderness.  Shoulder joints, elbow joints, wrist joints, MCPs, PIPs, DIPs have good range of motion with no synovitis.  PIP and DIP thickening consistent with osteoarthritis of both hands.  Hip joints have slightly limited range of motion.  Knee joints have good range of motion with no warmth or effusion.  Ankle joints have good range of motion with no tenderness or joint swelling.  CDAI Exam: CDAI Score: -- Patient Global: 30 / 100; Provider Global: 30 / 100 Swollen: --; Tender: -- Joint Exam 01/30/2023   No joint exam has been documented for this visit   There is currently no information documented on the homunculus. Go to the Rheumatology activity and complete the homunculus joint exam.  Investigation: No additional findings.  Imaging: CUP PACEART REMOTE DEVICE CHECK  Result Date: 01/23/2023 Scheduled remote reviewed. Normal device function.  Next remote 91 days. LA, CVRS   Recent Labs: Lab Results  Component Value Date   WBC 4.7 11/18/2022   HGB 13.6 11/18/2022   PLT 182 11/18/2022   NA 140 11/18/2022   K 4.5 11/18/2022   CL 104 11/18/2022   CO2 30 11/18/2022   GLUCOSE 98 11/18/2022   BUN 10 11/18/2022   CREATININE 1.16 11/18/2022   BILITOT 1.1 11/18/2022   ALKPHOS 58 01/20/2022   AST 30 11/18/2022   ALT 25 11/18/2022   PROT 7.0 11/18/2022   ALBUMIN 3.1 (L) 01/20/2022   CALCIUM 9.4 11/18/2022   GFRAA 71 11/13/2020   QFTBGOLDPLUS NEGATIVE 08/24/2018    Speciality Comments: No specialty comments available.  Procedures:  No procedures performed Allergies: Atorvastatin, Ezetimibe, Lipitor [atorvastatin], and Zetia [ezetimibe]   Assessment / Plan:     Visit Diagnoses: Seronegative rheumatoid arthritis (HCC): He has no joint tenderness or synovitis on examination today.  He has not had any signs or symptoms of a  rheumatoid arthritis flare.  He has clinically been doing well taking methotrexate 3 tablets by mouth once weekly and folic acid 1 mg daily.  He is tolerating methotrexate without any side effects and has not missed any doses recently.  He has not had any recent or recurrent infections.  He has not been experiencing any morning stiffness consistently, nocturnal pain, or difficulty with ADLs.  He will remain on methotrexate 3 tablets by mouth once weekly along with folic acid 1 mg daily.  He was advised to notify us if he develops signs or symptoms of a flare.  He will follow-up in the office in 5 months or sooner if needed.  High risk medication use - Methotrexate 3 tablets by mouth once weekly and folic acid 1 mg po daily. CBC and CMP updated on 11/18/22.  Orders for CBC and CMP released today.  His next lab work will be due at the end of November/early December and every 3 months.  No recent or recurrent infections.  Discussed the importance of holding methotrexate if he develops signs or symptoms of an infection and to resume once the infection has completely cleared.   - Plan: CBC with Differential/Platelet, COMPLETE METABOLIC PANEL WITH GFR  Primary osteoarthritis of both hands: PIP and DIP thickening consistent with osteoarthritis of both hands.  He experiences intermittent stiffness in both hands but has no active inflammation on examination today.  No tenderness or synovitis noted.  Complete fist formation bilaterally.  Discussed the importance of joint protection and muscle strengthening.  Primary osteoarthritis of both knees: He has good range of motion of both knee joints on examination today.  No warmth or effusion noted.  No difficulty climbing steps or rising from a seated position.  Primary osteoarthritis of  both feet: He is not experiencing any increased discomfort in his feet at this time.  He is good range of motion of both ankle joints with no tenderness or synovitis.  He is wearing  proper fitting shoes.  Spinal stenosis of lumbar region, unspecified whether neurogenic claudication present: Intermittent discomfort.  No nocturnal pain.  No midline spinal tenderness.  No symptoms of radiculopathy at this time.  Osteopenia of multiple sites - Previous DEXA 04/23/2019 RFN BMD 0.776 with T score -1.1.   DEXA updated on 04/03/22: LFN BMD 0.744 with T-score -1.0.  He continues to take vitamin D 2,000 units daily.  Other medical conditions are listed as follows:  Essential hypertension: Blood pressure was 147/82 today in the office.  His blood pressure was rechecked prior to leaving.  Patient was advised to monitor his blood pressure closely and to reach out to his PCP if it remains elevated.  Left bundle branch block (LBBB)  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  History of hypothyroidism  Malignant neoplasm of prostate (HCC)  History of anxiety  Family history of psoriasis  Orders: Orders Placed This Encounter  Procedures   CBC with Differential/Platelet   COMPLETE METABOLIC PANEL WITH GFR   No orders of the defined types were placed in this encounter.   Follow-Up Instructions: Return in about 5 months (around 07/02/2023) for Rheumatoid arthritis, Osteoarthritis.   Gearldine Bienenstock, PA-C  Note - This record has been created using Dragon software.  Chart creation errors have been sought, but may not always  have been located. Such creation errors do not reflect on  the standard of medical care.

## 2023-01-23 ENCOUNTER — Ambulatory Visit (INDEPENDENT_AMBULATORY_CARE_PROVIDER_SITE_OTHER): Payer: Medicare Other

## 2023-01-23 DIAGNOSIS — I442 Atrioventricular block, complete: Secondary | ICD-10-CM | POA: Diagnosis not present

## 2023-01-23 LAB — CUP PACEART REMOTE DEVICE CHECK
Battery Remaining Longevity: 107 mo
Battery Remaining Percentage: 94 %
Battery Voltage: 3.02 V
Brady Statistic AP VP Percent: 1 %
Brady Statistic AP VS Percent: 59 %
Brady Statistic AS VP Percent: 1 %
Brady Statistic AS VS Percent: 41 %
Brady Statistic RA Percent Paced: 58 %
Brady Statistic RV Percent Paced: 1 %
Date Time Interrogation Session: 20240815020013
Implantable Lead Connection Status: 753985
Implantable Lead Connection Status: 753985
Implantable Lead Implant Date: 20230816
Implantable Lead Implant Date: 20230816
Implantable Lead Location: 753859
Implantable Lead Location: 753860
Implantable Lead Model: 3830
Implantable Pulse Generator Implant Date: 20230816
Lead Channel Impedance Value: 490 Ohm
Lead Channel Impedance Value: 600 Ohm
Lead Channel Pacing Threshold Amplitude: 0.5 V
Lead Channel Pacing Threshold Amplitude: 1 V
Lead Channel Pacing Threshold Pulse Width: 0.5 ms
Lead Channel Pacing Threshold Pulse Width: 0.5 ms
Lead Channel Sensing Intrinsic Amplitude: 0.9 mV
Lead Channel Sensing Intrinsic Amplitude: 12 mV
Lead Channel Setting Pacing Amplitude: 2.5 V
Lead Channel Setting Pacing Amplitude: 2.5 V
Lead Channel Setting Pacing Pulse Width: 0.5 ms
Lead Channel Setting Sensing Sensitivity: 2 mV
Pulse Gen Model: 2272
Pulse Gen Serial Number: 8101539

## 2023-01-30 ENCOUNTER — Ambulatory Visit: Payer: Medicare Other | Attending: Rheumatology | Admitting: Physician Assistant

## 2023-01-30 ENCOUNTER — Encounter: Payer: Self-pay | Admitting: Physician Assistant

## 2023-01-30 VITALS — BP 157/85 | HR 61 | Resp 15 | Ht 67.5 in | Wt 187.8 lb

## 2023-01-30 DIAGNOSIS — M170 Bilateral primary osteoarthritis of knee: Secondary | ICD-10-CM

## 2023-01-30 DIAGNOSIS — M19071 Primary osteoarthritis, right ankle and foot: Secondary | ICD-10-CM

## 2023-01-30 DIAGNOSIS — M19041 Primary osteoarthritis, right hand: Secondary | ICD-10-CM | POA: Diagnosis not present

## 2023-01-30 DIAGNOSIS — M19072 Primary osteoarthritis, left ankle and foot: Secondary | ICD-10-CM

## 2023-01-30 DIAGNOSIS — M8589 Other specified disorders of bone density and structure, multiple sites: Secondary | ICD-10-CM

## 2023-01-30 DIAGNOSIS — Z840 Family history of diseases of the skin and subcutaneous tissue: Secondary | ICD-10-CM

## 2023-01-30 DIAGNOSIS — Z79899 Other long term (current) drug therapy: Secondary | ICD-10-CM

## 2023-01-30 DIAGNOSIS — C61 Malignant neoplasm of prostate: Secondary | ICD-10-CM

## 2023-01-30 DIAGNOSIS — M19042 Primary osteoarthritis, left hand: Secondary | ICD-10-CM

## 2023-01-30 DIAGNOSIS — I10 Essential (primary) hypertension: Secondary | ICD-10-CM

## 2023-01-30 DIAGNOSIS — M48061 Spinal stenosis, lumbar region without neurogenic claudication: Secondary | ICD-10-CM

## 2023-01-30 DIAGNOSIS — Z953 Presence of xenogenic heart valve: Secondary | ICD-10-CM

## 2023-01-30 DIAGNOSIS — M0600 Rheumatoid arthritis without rheumatoid factor, unspecified site: Secondary | ICD-10-CM

## 2023-01-30 DIAGNOSIS — Z8659 Personal history of other mental and behavioral disorders: Secondary | ICD-10-CM

## 2023-01-30 DIAGNOSIS — I447 Left bundle-branch block, unspecified: Secondary | ICD-10-CM

## 2023-01-30 DIAGNOSIS — Z8639 Personal history of other endocrine, nutritional and metabolic disease: Secondary | ICD-10-CM

## 2023-01-30 LAB — COMPLETE METABOLIC PANEL WITH GFR
AG Ratio: 1.7 (calc) (ref 1.0–2.5)
ALT: 23 U/L (ref 9–46)
AST: 30 U/L (ref 10–35)
Albumin: 4.5 g/dL (ref 3.6–5.1)
Alkaline phosphatase (APISO): 70 U/L (ref 35–144)
BUN: 14 mg/dL (ref 7–25)
CO2: 30 mmol/L (ref 20–32)
Calcium: 9.4 mg/dL (ref 8.6–10.3)
Chloride: 102 mmol/L (ref 98–110)
Creat: 1.2 mg/dL (ref 0.70–1.28)
Globulin: 2.6 g/dL (calc) (ref 1.9–3.7)
Glucose, Bld: 102 mg/dL — ABNORMAL HIGH (ref 65–99)
Potassium: 4.3 mmol/L (ref 3.5–5.3)
Sodium: 139 mmol/L (ref 135–146)
Total Bilirubin: 0.9 mg/dL (ref 0.2–1.2)
Total Protein: 7.1 g/dL (ref 6.1–8.1)
eGFR: 62 mL/min/{1.73_m2} (ref >=60)

## 2023-01-30 LAB — CBC WITH DIFFERENTIAL/PLATELET
Absolute Monocytes: 586 cells/uL (ref 200–950)
Basophils Absolute: 8 cells/uL (ref 0–200)
Basophils Relative: 0.2 %
Eosinophils Absolute: 41 cells/uL (ref 15–500)
Eosinophils Relative: 1 %
HCT: 41 % (ref 38.5–50.0)
Hemoglobin: 13.6 g/dL (ref 13.2–17.1)
Lymphs Abs: 861 cells/uL (ref 850–3900)
MCH: 31.8 pg (ref 27.0–33.0)
MCHC: 33.2 g/dL (ref 32.0–36.0)
MCV: 95.8 fL (ref 80.0–100.0)
MPV: 9.7 fL (ref 7.5–12.5)
Monocytes Relative: 14.3 %
Neutro Abs: 2604 cells/uL (ref 1500–7800)
Neutrophils Relative %: 63.5 %
Platelets: 188 10*3/uL (ref 140–400)
RBC: 4.28 10*6/uL (ref 4.20–5.80)
RDW: 13.5 % (ref 11.0–15.0)
Total Lymphocyte: 21 %
WBC: 4.1 10*3/uL (ref 3.8–10.8)

## 2023-01-30 NOTE — Patient Instructions (Signed)
Standing Labs We placed an order today for your standing lab work.   Please have your standing labs drawn in early December and every 3 months   Please have your labs drawn 2 weeks prior to your appointment so that the provider can discuss your lab results at your appointment, if possible.  Please note that you may see your imaging and lab results in MyChart before we have reviewed them. We will contact you once all results are reviewed. Please allow our office up to 72 hours to thoroughly review all of the results before contacting the office for clarification of your results.  WALK-IN LAB HOURS  Monday through Thursday from 8:00 am -12:30 pm and 1:00 pm-5:00 pm and Friday from 8:00 am-12:00 pm.  Patients with office visits requiring labs will be seen before walk-in labs.  You may encounter longer than normal wait times. Please allow additional time. Wait times may be shorter on  Monday and Thursday afternoons.  We do not book appointments for walk-in labs. We appreciate your patience and understanding with our staff.   Labs are drawn by [facility]. Please bring your co-pay at the time of your lab draw.  You may receive a bill from [facility] for your lab work.  Please note if you are on Hydroxychloroquine and and an order has been placed for a Hydroxychloroquine level,  you will need to have it drawn 4 hours or more after your last dose.  If you wish to have your labs drawn at another location, please call the office 24 hours in advance so we can fax the orders.  The office is located at [address], [address], [place], [place] [address]   If you have any questions regarding directions or hours of operation,  please call [phone].   As a reminder, please drink plenty of water prior to coming for your lab work. Thanks!

## 2023-01-30 NOTE — Progress Notes (Signed)
CBC and CMP WNL

## 2023-02-05 NOTE — Progress Notes (Signed)
Remote pacemaker transmission.   

## 2023-02-11 NOTE — Progress Notes (Addendum)
  Electrophysiology Office Note:   Date:  02/12/2023  ID:  Richard Squyres, DOB April 26, 1946, MRN [id number]  Primary Cardiologist: Norman Herrlich, MD Electrophysiologist: Sherryl Manges, MD      History of Present Illness:   Eric [name] is a 77 y.o. 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   Seen in EP Clinic by Dr. Graciela Husbands 05/2022 and was doing well. Last remote device check 01/23/2023 showed normal device function.   Since last being seen in our clinic the patient reports doing very well. He remains active by working with his sons and caring for his 7 grandchildren. Mild fatigue with busy days but no chest pain, SOB or activity limitations.   He denies chest pain, palpitations, dyspnea, PND, orthopnea, nausea, vomiting, dizziness, syncope, edema, weight gain, or early satiety.   Review of systems complete and found to be negative unless listed in HPI.    EP Information / Studies Reviewed:     PPM Interrogation-  reviewed in detail today,  See PACEART report.  Device History: Abbott Dual Chamber PPM implanted 01/23/2022 for CHB      Studies ECHO 01/2022 > LVEF 60-65%  Physical Exam:   VS:  BP (!) 162/80   Pulse 60   Ht 5' 7.5" (1.715 m)   Wt 188 lb 9.6 oz (85.5 kg)   SpO2 98%   BMI 29.10 kg/m    Wt Readings from Last 3 Encounters:  02/12/23 188 lb 9.6 oz (85.5 kg)  01/30/23 187 lb 12.8 oz (85.2 kg)  10/25/22 185 lb 8 oz (84.1 kg)     GEN: Well nourished, well developed in no acute distress NECK: No JVD; No carotid bruits CARDIAC: Regular rate and rhythm, no murmurs, rubs, gallops RESPIRATORY:  Clear to auscultation without rales, wheezing or rhonchi  ABDOMEN: Soft, non-tender, non-distended EXTREMITIES:  No edema; No deformity   ASSESSMENT AND PLAN:    CHB s/p Abbott PPM  Cardiac Arrest secondary to Bradycardia  -Normal PPM function -See Pace Art report -No changes  today  AV Stenosis s/p minimally invasive AVR  -per Cardiology   HTN   -continue coreg  Disposition:   Follow up with Dr. Graciela Husbands in 12 months  Signed, Canary Brim, MSN, APRN, NP-C, AGACNP-BC [facility] - Electrophysiology  02/12/2023, 11:21 AM

## 2023-02-12 ENCOUNTER — Encounter: Payer: Self-pay | Admitting: Student

## 2023-02-12 ENCOUNTER — Ambulatory Visit: Payer: Medicare Other | Attending: Student | Admitting: Pulmonary Disease

## 2023-02-12 VITALS — BP 162/80 | HR 60 | Ht 67.5 in | Wt 188.6 lb

## 2023-02-12 DIAGNOSIS — Z950 Presence of cardiac pacemaker: Secondary | ICD-10-CM

## 2023-02-12 DIAGNOSIS — I350 Nonrheumatic aortic (valve) stenosis: Secondary | ICD-10-CM

## 2023-02-12 DIAGNOSIS — I10 Essential (primary) hypertension: Secondary | ICD-10-CM

## 2023-02-12 DIAGNOSIS — I442 Atrioventricular block, complete: Secondary | ICD-10-CM

## 2023-02-12 LAB — CUP PACEART INCLINIC DEVICE CHECK
Battery Remaining Longevity: 122 mo
Battery Voltage: 3.02 V
Brady Statistic RA Percent Paced: 57 %
Brady Statistic RV Percent Paced: 0.03 %
Date Time Interrogation Session: 20240904115558
Implantable Lead Connection Status: 753985
Implantable Lead Connection Status: 753985
Implantable Lead Implant Date: 20230816
Implantable Lead Implant Date: 20230816
Implantable Lead Location: 753859
Implantable Lead Location: 753860
Implantable Lead Model: 3830
Implantable Pulse Generator Implant Date: 20230816
Lead Channel Impedance Value: 525 Ohm
Lead Channel Impedance Value: 600 Ohm
Lead Channel Pacing Threshold Amplitude: 0.75 V
Lead Channel Pacing Threshold Amplitude: 0.75 V
Lead Channel Pacing Threshold Amplitude: 1 V
Lead Channel Pacing Threshold Amplitude: 1 V
Lead Channel Pacing Threshold Pulse Width: 0.5 ms
Lead Channel Pacing Threshold Pulse Width: 0.5 ms
Lead Channel Pacing Threshold Pulse Width: 0.5 ms
Lead Channel Pacing Threshold Pulse Width: 0.5 ms
Lead Channel Sensing Intrinsic Amplitude: 1 mV
Lead Channel Sensing Intrinsic Amplitude: 12 mV
Lead Channel Setting Pacing Amplitude: 2 V
Lead Channel Setting Pacing Amplitude: 2.5 V
Lead Channel Setting Pacing Pulse Width: 0.5 ms
Lead Channel Setting Sensing Sensitivity: 2 mV
Pulse Gen Model: 2272
Pulse Gen Serial Number: 8101539

## 2023-02-12 NOTE — Patient Instructions (Signed)
Medication Instructions:  Your physician recommends that you continue on your current medications as directed. Please refer to the Current Medication list given to you today.  *If you need a refill on your cardiac medications before your next appointment, please call your pharmacy*   Lab Work: None If you have labs (blood work) drawn today and your tests are completely normal, you will receive your results only by: [place] (if you have MyChart) OR A paper copy in the mail If you have any lab test that is abnormal or we need to change your treatment, we will call you to review the results.   Follow-Up: At [facility], you and your health needs are our priority.  As part of our continuing mission to provide you with exceptional heart care, we have created designated Provider Care Teams.  These Care Teams include your primary Cardiologist (physician) and Advanced Practice Providers (APPs -  Physician Assistants and Nurse Practitioners) who all work together to provide you with the care you need, when you need it.   Your next appointment:   1 year(s)  Provider:   Virl Axe, MD

## 2023-02-18 ENCOUNTER — Ambulatory Visit (INDEPENDENT_AMBULATORY_CARE_PROVIDER_SITE_OTHER): Payer: Medicare Other

## 2023-02-18 DIAGNOSIS — Z23 Encounter for immunization: Secondary | ICD-10-CM | POA: Diagnosis not present

## 2023-03-13 ENCOUNTER — Other Ambulatory Visit: Payer: Self-pay | Admitting: Physician Assistant

## 2023-03-13 NOTE — Telephone Encounter (Signed)
Last Fill: 12/19/2022  Labs: 01/30/2023 CBC and CMP WNL   Next Visit: 07/02/2023  Last Visit: 01/30/2023  DX: Seronegative rheumatoid arthritis   Current Dose per office note 01/30/2023: Methotrexate 3 tablets by mouth once weekly   Okay to refill Methotrexate?

## 2023-04-15 ENCOUNTER — Other Ambulatory Visit: Payer: Self-pay | Admitting: Internal Medicine

## 2023-04-24 ENCOUNTER — Ambulatory Visit: Payer: Medicare Other

## 2023-04-24 DIAGNOSIS — I442 Atrioventricular block, complete: Secondary | ICD-10-CM | POA: Diagnosis not present

## 2023-04-25 LAB — CUP PACEART REMOTE DEVICE CHECK
Battery Remaining Longevity: 109 mo
Battery Remaining Percentage: 92 %
Battery Voltage: 3.02 V
Brady Statistic AP VP Percent: 1 %
Brady Statistic AP VS Percent: 61 %
Brady Statistic AS VP Percent: 0 %
Brady Statistic AS VS Percent: 39 %
Brady Statistic RA Percent Paced: 60 %
Brady Statistic RV Percent Paced: 1 %
Date Time Interrogation Session: 20241114021343
Implantable Lead Connection Status: 753985
Implantable Lead Connection Status: 753985
Implantable Lead Implant Date: 20230816
Implantable Lead Implant Date: 20230816
Implantable Lead Location: 753859
Implantable Lead Location: 753860
Implantable Lead Model: 3830
Implantable Pulse Generator Implant Date: 20230816
Lead Channel Impedance Value: 480 Ohm
Lead Channel Impedance Value: 600 Ohm
Lead Channel Pacing Threshold Amplitude: 0.75 V
Lead Channel Pacing Threshold Amplitude: 1 V
Lead Channel Pacing Threshold Pulse Width: 0.5 ms
Lead Channel Pacing Threshold Pulse Width: 0.5 ms
Lead Channel Sensing Intrinsic Amplitude: 1.2 mV
Lead Channel Sensing Intrinsic Amplitude: 12 mV
Lead Channel Setting Pacing Amplitude: 2 V
Lead Channel Setting Pacing Amplitude: 2.5 V
Lead Channel Setting Pacing Pulse Width: 0.5 ms
Lead Channel Setting Sensing Sensitivity: 2 mV
Pulse Gen Model: 2272
Pulse Gen Serial Number: 8101539

## 2023-04-28 ENCOUNTER — Ambulatory Visit: Payer: Medicare Other | Admitting: Internal Medicine

## 2023-04-28 ENCOUNTER — Ambulatory Visit (HOSPITAL_BASED_OUTPATIENT_CLINIC_OR_DEPARTMENT_OTHER)
Admission: RE | Admit: 2023-04-28 | Discharge: 2023-04-28 | Disposition: A | Discharge status: Home / Self Care | Payer: Medicare Other | Source: Ambulatory Visit | Attending: Internal Medicine | Admitting: Internal Medicine

## 2023-04-28 ENCOUNTER — Encounter: Payer: Self-pay | Admitting: Internal Medicine

## 2023-04-28 VITALS — BP 122/68 | HR 65 | Temp 98.0°F | Resp 16 | Ht 67.5 in | Wt 190.5 lb

## 2023-04-28 DIAGNOSIS — E038 Other specified hypothyroidism: Secondary | ICD-10-CM | POA: Diagnosis not present

## 2023-04-28 DIAGNOSIS — R079 Chest pain, unspecified: Secondary | ICD-10-CM | POA: Diagnosis not present

## 2023-04-28 DIAGNOSIS — G588 Other specified mononeuropathies: Secondary | ICD-10-CM | POA: Diagnosis not present

## 2023-04-28 DIAGNOSIS — H9390 Unspecified disorder of ear, unspecified ear: Secondary | ICD-10-CM | POA: Diagnosis not present

## 2023-04-28 LAB — TSH: TSH: 1.99 u[IU]/mL (ref 0.35–5.50)

## 2023-04-28 NOTE — Progress Notes (Unsigned)
Subjective:    Patient ID: Eric [name], male    DOB: 1945-08-20, 77 y.o.   MRN: [id number]  DOS:  04/28/2023 Type of visit - description: f/u  Chronic medical problems addressed.  Still have on and off left lateral chest pain in a dermatomal distribution.  Also has developed pain mostly at the right upper chest, for the last few weeks, happening consistently with deep breath or cough. No recent fever or chills. No difficulty breathing no lower extremity edema.  I noted skin lesion at the left ear, states that started a year ago.   Review of Systems See above   Past Medical History:  Diagnosis Date   Full dentures    Heart murmur    History of aortic valve stenosis 2011   06/ 2011 dx mild bicuspid stenosis;    then 04/ 2019 severe  stenosis   History of COVID-19 04/23/2019   positive result in epic;  per pt asymptomatic   History of subarachnoid hemorrhage 04/27/2019   hospital admission in epic, dx bilateral frontal SAH w/ left occipital skull fx nondisplaced due to syncope w/ fall secondary to dehydration/ ortho hypotension   History of supraventricular tachycardia 12/2017   followed by cardiology   Hyperlipidemia    Hypertension    followed by cardiology and pcp   Hypothyroidism    followed by pcp   Left bundle branch block (LBBB) 12/18/2017   post op AV replacement 12-17-2017   Lower urinary tract symptoms (LUTS)    Macular edema of right eye    Dr  Stephannie Li    OA (osteoarthritis)    multiple sites , followed by dr s. Corliss Skains   Prostate cancer [facility])    urologist--- dr Annabell Howells--- dx 04/ 2022, Gleason 3+4, PSA 3.76   Rheumatoid arthritis (HCC) 11/17/2018   rheumotologist-- dr s. 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 12/17/2017   Edwards Intuity Elite rapid deployment stented bovine pericardial tissue valve via right mini thoracotomy approach   SOB (shortness of breath)    per pt  occasionally with yard work, but ok with stairs and normal activities   Spinal stenosis of lumbar region 04/23/2018   Wide-complex tachycardia 12/30/2017   (followed by dr Dulce Sellar)  post cardioversion by EMS 12-30-2017 for SVT 170s post op AV replacement 12-17-2017 , CHF, AKI    Past Surgical History:  Procedure Laterality Date   AORTIC VALVE REPLACEMENT N/A 12/17/2017   Procedure: MINIMALLY INVASIVE AORTIC VALVE REPLACEMENT (AVR) [ using Edwards Intuity Valve Size 23mm;  Surgeon: Purcell Nails, MD;  Location: [facility] OR;  Service: Open Heart Surgery;  Laterality: N/A;  MINI THORACOTOMY   CARDIAC CATHETERIZATION  11/10/2000   @[facility]  by dr Graciela Husbands;  normal coronary angiography and normal LVF   CATARACT EXTRACTION W/ INTRAOCULAR LENS IMPLANT Right 2018   COLONOSCOPY     last one 02-29-2020 by dr h. danis   CYSTOSCOPY N/A 02/09/2021   Procedure: Bluford Kaufmann;  Surgeon: Bjorn Pippin, MD;  Location: [facility];  Service: Urology;  Laterality: N/A;   INGUINAL HERNIA REPAIR Right 2008   LAPAROSCOPIC CHOLECYSTECTOMY  06/26/2001   @[facility] ;  WITH OPEN LEFT INGUINAL HERNIA REPAIR   LUMBAR LAMINECTOMY/DECOMPRESSION MICRODISCECTOMY N/A 04/23/2018   Procedure: Microlumbar decompression Lumbar five-Sacral one;  Surgeon: Jene Every, MD;  Location: [facility] OR;  Service: Orthopedics;  Laterality: N/A;   PACEMAKER IMPLANT N/A 01/23/2022   Procedure: PACEMAKER IMPLANT;  Surgeon: Duke Salvia,  MD;  Location: [facility] INVASIVE CV LAB;  Service: Cardiovascular;  Laterality: N/A;   PROSTATE BIOPSY  09/20/2020   RADIOACTIVE SEED IMPLANT N/A 02/09/2021   Procedure: RADIOACTIVE SEED IMPLANT/BRACHYTHERAPY IMPLANT;  Surgeon: Bjorn Pippin, MD;  Location: [facility];  Service: Urology;  Laterality: N/A;  56 seeds   RIGHT/LEFT HEART CATH AND CORONARY ANGIOGRAPHY N/A 10/16/2017   Procedure: RIGHT/LEFT HEART CATH AND CORONARY ANGIOGRAPHY;  Surgeon: Tonny Bollman, MD;  Location: [facility] INVASIVE CV LAB;  Service:  Cardiovascular;  Laterality: N/A;   SPACE OAR INSTILLATION N/A 02/09/2021   Procedure: SPACE OAR INSTILLATION;  Surgeon: Bjorn Pippin, MD;  Location: [facility];  Service: Urology;  Laterality: N/A;   TEE WITHOUT CARDIOVERSION N/A 12/17/2017   Procedure: TRANSESOPHAGEAL ECHOCARDIOGRAM (TEE);  Surgeon: Purcell Nails, MD;  Location: [facility] OR;  Service: Open Heart Surgery;  Laterality: N/A;   TEMPORARY PACEMAKER N/A 01/17/2022   Procedure: TEMPORARY PACEMAKER;  Surgeon: Lyn Records, MD;  Location: [facility] INVASIVE CV LAB;  Service: Cardiovascular;  Laterality: N/A;   TONSILLECTOMY  1960    Current Outpatient Medications  Medication Instructions   acetaminophen (TYLENOL) 500 mg, Oral, Every 6 hours PRN   aspirin EC 81 mg, Oral, Daily   carvedilol (COREG) 12.5 MG tablet TAKE 1 TABLET (12.5MG  TOTAL) BY MOUTH TWICE A DAY WITH MEALS   fluticasone (FLONASE) 50 MCG/ACT nasal spray 2 sprays, Each Nare, Daily   folic acid (FOLVITE) 1 mg, Oral, Daily   levothyroxine (SYNTHROID) 88 mcg, Oral, Daily before breakfast   methotrexate (RHEUMATREX) 2.5 MG tablet TAKE 3 TABLETS BY MOUTH ONCE A WEEK   rosuvastatin (CRESTOR) 20 mg, Oral, Daily at bedtime   Vitamin D3 2,000 Units, Daily       Objective:   Physical Exam BP 122/68   Pulse 65   Temp 98 F (36.7 C) (Oral)   Resp 16   Ht 5' 7.5" (1.715 m)   Wt 190 lb 8 oz (86.4 kg)   SpO2 98%   BMI 29.40 kg/m  General:   Well developed, NAD, BMI noted. HEENT:  Normocephalic . Face symmetric, atraumatic. Left ear: See picture Lungs:  CTA B Normal respiratory effort, no intercostal retractions, no accessory muscle use. Chest wall: Area where the pacemaker is placed with no redness or swelling. Has a well-healed scar at the right anterior chest from previous surgery. Chest wall is not TTP on either side. Heart: RRR,  no murmur.  Lower extremities: no pretibial edema bilaterally  Skin: Not pale. Not jaundice Neurologic:  alert & oriented  X3.  Speech normal, gait appropriate for age and unassisted Psych--  Cognition and judgment appear intact.  Cooperative with normal attention span and concentration.  Behavior appropriate. No anxious or depressed appearing.  L ear:      Assessment      Assessment HTN Hyperlipidemia Hypothyroidism R.A. dx 2020 DJD Increase LFTs Ultrasound 2002 done for increased LFTs: Normal liver; Hep C negative 02-16-15 CV: --Aortic stenosis , dx  2012 , s/p AoVR 12/2017 --Fusiform infrarenal abdominal aortic ectasis: -- FF,  Brother has CAD dx age 23s --Cardiac cath  10/2017, minimal dz -- cardiac arrest 2023, pacemaker  Macular degeneration, cataracts  Subarachnoid hemorrhage, left orbital contusion, left occipital fracture 2020 Prostate cancer, Dx 09-2020 Microscopic hematuria: Had a cystoscopy and a CT at urology  PLAN Hypothyroidism: Check TSH.adjust meds if needed Hyperlipidemia: Last LDL 70, controlled.  No change Aortic stenosis, pacemaker, follow-up by cardiology and the device clinic.  Intercostal neuritis?  Patient again raised the issue of on and off pain at the left thorax on a dermatomal distribution.  Possibly intercostal neuritis.  Refer to Ortho/pain mngmt. R chest pain: As described above, patient think is MSK issue, I agree.  For completeness we will get a chest x-ray. Skin lesion: Located at the left ear, going on for a year, referred to ENT, BCC?Marland Kitchen Preventive care: Vaccine advice provided,see AVS RTC 3 months CPX.

## 2023-04-28 NOTE — Patient Instructions (Addendum)
Vaccines I recommend: Shingrix (shingles) Covid booster)  I am referring you to: Ear nose and throat (ENT) for the ear lesion. Orthopedics for the left side chest pain, possibly a condition called neuritis.   Check the  blood pressure regularly Blood pressure goal:  between 110/65 and  135/85. If it is consistently higher or lower, let me know     GO TO THE LAB : Get the blood work     Next visit with me 3 months for a physical exam     Please schedule it at the front desk

## 2023-04-29 NOTE — Assessment & Plan Note (Signed)
Hypothyroidism: Check TSH.adjust meds if needed Hyperlipidemia: Last LDL 70, controlled.  No change Aortic stenosis, pacemaker, follow-up by cardiology and the device clinic. Intercostal neuritis?  Patient again raised the issue of on and off pain at the left thorax on a dermatomal distribution.  Possibly intercostal neuritis.  Refer to Ortho/pain mngmt. R chest pain: As described above, patient think is MSK issue, I agree.  For completeness we will get a chest x-ray. Skin lesion: Located at the left ear, going on for a year, referred to ENT, BCC?Marland Kitchen Preventive care: Vaccine advice provided,see AVS RTC 3 months CPX.

## 2023-05-01 ENCOUNTER — Encounter (INDEPENDENT_AMBULATORY_CARE_PROVIDER_SITE_OTHER): Payer: Self-pay | Admitting: Otolaryngology

## 2023-05-12 NOTE — Progress Notes (Signed)
Remote pacemaker transmission.   

## 2023-05-28 ENCOUNTER — Other Ambulatory Visit: Payer: Self-pay | Admitting: *Deleted

## 2023-05-28 DIAGNOSIS — Z79899 Other long term (current) drug therapy: Secondary | ICD-10-CM

## 2023-05-29 ENCOUNTER — Institutional Professional Consult (permissible substitution) (INDEPENDENT_AMBULATORY_CARE_PROVIDER_SITE_OTHER): Payer: Medicare Other

## 2023-05-29 LAB — COMPLETE METABOLIC PANEL WITH GFR
AG Ratio: 1.5 (calc) (ref 1.0–2.5)
ALT: 20 U/L (ref 9–46)
AST: 30 U/L (ref 10–35)
Albumin: 4.2 g/dL (ref 3.6–5.1)
Alkaline phosphatase (APISO): 70 U/L (ref 35–144)
BUN: 12 mg/dL (ref 7–25)
CO2: 30 mmol/L (ref 20–32)
Calcium: 9.6 mg/dL (ref 8.6–10.3)
Chloride: 102 mmol/L (ref 98–110)
Creat: 1.17 mg/dL (ref 0.70–1.28)
Globulin: 2.8 g/dL (ref 1.9–3.7)
Glucose, Bld: 111 mg/dL — ABNORMAL HIGH (ref 65–99)
Potassium: 4.4 mmol/L (ref 3.5–5.3)
Sodium: 140 mmol/L (ref 135–146)
Total Bilirubin: 0.9 mg/dL (ref 0.2–1.2)
Total Protein: 7 g/dL (ref 6.1–8.1)
eGFR: 64 mL/min/{1.73_m2} (ref >=60)

## 2023-05-29 LAB — CBC WITH DIFFERENTIAL/PLATELET
Absolute Lymphocytes: 1161 {cells}/uL (ref 850–3900)
Absolute Monocytes: 573 {cells}/uL (ref 200–950)
Basophils Absolute: 9 {cells}/uL (ref 0–200)
Basophils Relative: 0.2 %
Eosinophils Absolute: 80 {cells}/uL (ref 15–500)
Eosinophils Relative: 1.7 %
HCT: 43 % (ref 38.5–50.0)
Hemoglobin: 14.2 g/dL (ref 13.2–17.1)
MCH: 32.2 pg (ref 27.0–33.0)
MCHC: 33 g/dL (ref 32.0–36.0)
MCV: 97.5 fL (ref 80.0–100.0)
MPV: 9.7 fL (ref 7.5–12.5)
Monocytes Relative: 12.2 %
Neutro Abs: 2876 {cells}/uL (ref 1500–7800)
Neutrophils Relative %: 61.2 %
Platelets: 195 10*3/uL (ref 140–400)
RBC: 4.41 10*6/uL (ref 4.20–5.80)
RDW: 13.6 % (ref 11.0–15.0)
Total Lymphocyte: 24.7 %
WBC: 4.7 10*3/uL (ref 3.8–10.8)

## 2023-05-29 NOTE — Progress Notes (Signed)
CBC and CMP are normal.

## 2023-06-05 ENCOUNTER — Other Ambulatory Visit: Payer: Self-pay | Admitting: Physician Assistant

## 2023-06-05 HISTORY — PX: BASAL CELL CARCINOMA EXCISION: SHX1214

## 2023-06-05 NOTE — Telephone Encounter (Signed)
Last Fill: 03/13/2023  Labs: 05/28/2023 CBC and CMP are normal.   Next Visit: 07/02/2023  Last Visit: 01/30/2023  DX: Seronegative rheumatoid arthritis   Current Dose per office note 01/30/2023: Methotrexate 3 tablets by mouth once weekly   Okay to refill Methotrexate?

## 2023-06-11 HISTORY — PX: SKIN CANCER EXCISION: SHX779

## 2023-06-16 LAB — PSA: PSA: 0.118

## 2023-06-18 NOTE — Progress Notes (Signed)
Office Visit Note  Patient: Eric [name]             Date of Birth: [birth year]           MRN: [id number]             PCP: Wanda Plump, MD Referring: Wanda Plump, MD Visit Date: 07/02/2023 Occupation: @GUAROCC @  Subjective:  Medication management   History of Present Illness: Eric [name] is a 78 y.o. male with seronegative rheumatoid arthritis and osteoarthritis.  He has been taking methotrexate 3 tablets by mouth weekly and folic acid 1 mg daily without any interruption.  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  He also had a lesion on his back which was removed on July 08, 2023.  He has a follow-up appointment for that.  He was advised not to stop methotrexate by his dermatologist.  He has been tolerating methotrexate well.  He denies any increased joint pain or joint swelling.    Activities of Daily Living:  Patient reports morning stiffness for 0 minutes.   Patient Denies nocturnal pain.  Difficulty dressing/grooming: Denies Difficulty climbing stairs: Denies Difficulty getting out of chair: Denies Difficulty using hands for taps, buttons, cutlery, and/or writing: Denies  Review of Systems  Constitutional:  Negative for fatigue.  HENT:  Negative for mouth sores, mouth dryness and nose dryness.   Eyes:  Negative for pain and dryness.  Respiratory:  Negative for shortness of breath and difficulty breathing.   Cardiovascular:  Negative for chest pain and palpitations.  Gastrointestinal:  Negative for blood in stool, constipation and diarrhea.  Endocrine: Negative for increased urination.  Genitourinary:  Positive for urgency. Negative for painful urination and involuntary urination.  Musculoskeletal:  Negative for joint pain, gait problem, joint pain, joint swelling, myalgias, muscle weakness, morning stiffness, muscle tenderness and myalgias.  Skin:  Negative for color change, rash, hair loss and sensitivity to sunlight.   Allergic/Immunologic: Negative for susceptible to infections.  Neurological:  Negative for dizziness, numbness and headaches.  Hematological:  Negative for swollen glands.  Psychiatric/Behavioral:  Negative for depressed mood and sleep disturbance. The patient is not nervous/anxious.     PMFS History:  Patient Active Problem List   Diagnosis Date Noted  . Pacemaker - STJ 05/28/2022  . Complete heart block (HCC) 01/17/2022  . Heart block AV complete (HCC)   . Cardiac arrest (HCC)   . Cardiogenic shock (HCC)   . Malignant neoplasm of prostate (HCC) 10/24/2020  . Lumbar stenosis   . Heart murmur   . GERD (gastroesophageal reflux disease)   . Dyspnea   . Cataract   . Arthritis   . Anxiety   . Body mass index (BMI) 30.0-30.9, adult 06/23/2019  . Subdural hematoma (HCC) 05/05/2019  . Subarachnoid hemorrhage (HCC) 04/28/2019  . History of COVID-19 04/23/2019  . Rheumatoid arthritis (HCC) 11/17/2018  . Spinal stenosis of lumbar region 04/23/2018  . HNP (herniated nucleus pulposus), lumbar 04/23/2018  . Prolapsed lumbar disc 03/03/2018  . Wide-complex tachycardia 12/30/2017  . Left bundle branch block (LBBB) 12/18/2017  . 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 Lower urinary tract symptoms (LUTS) 12/2017  . Abnormal LFTs   . Macular edema   . PCP NOTES >>>>>> 04/27/2015  . Hyperglycemia 10/20/2014  . Dizziness 04/21/2014  . Annual physical exam 04/15/2011  . Aortic stenosis 04/15/2011  . History of aortic valve stenosis 2011  .  Hypothyroidism 10/13/2007  . Hyperlipidemia 10/13/2007  . Hypertension 04/08/2007    Past Medical History:  Diagnosis Date  . Full dentures   . Heart murmur   . History of aortic valve stenosis 2011   06/ 2011 dx mild bicuspid stenosis;    then 04/ 2019 severe  stenosis  . History of COVID-19 04/23/2019   positive result in epic;  per pt asymptomatic  . History of subarachnoid hemorrhage 04/27/2019    hospital admission in epic, dx bilateral frontal SAH w/ left occipital skull fx nondisplaced due to syncope w/ fall secondary to dehydration/ ortho hypotension  . History of supraventricular tachycardia 12/2017   followed by cardiology  . Hyperlipidemia   . Hypertension    followed by cardiology and pcp  . Hypothyroidism    followed by pcp  . Left bundle branch block (LBBB) 12/18/2017   post op AV replacement 12-17-2017  . Lower urinary tract symptoms (LUTS)   . Macular edema of right eye    Dr  Stephannie Li   . OA (osteoarthritis)    multiple sites , followed by dr s. Corliss Skains  . Prostate cancer [facility])    urologist--- dr Annabell Howells--- dx 04/ 2022, Gleason 3+4, PSA 3.76  . Rheumatoid arthritis (HCC) 11/17/2018   rheumotologist-- dr s. Corliss Skains, takes methotrexate  . 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 SOB (shortness of breath)    per pt occasionally with yard work, but ok with stairs and normal activities  . Spinal stenosis of lumbar region 04/23/2018  . Wide-complex tachycardia 12/30/2017   (followed by dr Dulce Sellar)  post cardioversion by EMS 12-30-2017 for SVT 170s post op AV replacement 12-17-2017 , CHF, AKI    Family History  Problem Relation Age of Onset  . Diabetes Mother   . Stroke Mother   . Diabetes Brother        ?  . Coronary artery disease Brother        Male 1st degree relative >50, had CABG in his 24s  . Emphysema Father   . COPD Father   . Diabetes Daughter   . Colon cancer Neg Hx   . Prostate cancer Neg Hx   . Colon polyps Neg Hx   . Esophageal cancer Neg Hx   . Rectal cancer Neg Hx   . Stomach cancer Neg Hx    Past Surgical History:  Procedure Laterality Date  . AORTIC VALVE REPLACEMENT N/A 12/17/2017   Procedure: MINIMALLY INVASIVE AORTIC VALVE REPLACEMENT (AVR) [ using Edwards Intuity  Valve Size 23mm;  Surgeon: Purcell Nails, MD;  Location: [facility] OR;  Service: Open Heart Surgery;  Laterality: N/A;  MINI THORACOTOMY  . BASAL CELL CARCINOMA EXCISION Left 06/05/2023   left ear  . CARDIAC CATHETERIZATION  11/10/2000   @[facility]  by dr Graciela Husbands;  normal coronary angiography and normal LVF  . CATARACT EXTRACTION W/ INTRAOCULAR LENS IMPLANT Right 2018  . COLONOSCOPY     last one 02-29-2020 by dr h. Myrtie Neither  . CYSTOSCOPY N/A 02/09/2021   Procedure: CYSTOSCOPY;  Surgeon: Bjorn Pippin, MD;  Location[facility];  Service: Urology;  Laterality: N/A;  . INGUINAL HERNIA REPAIR Right 2008  . LAPAROSCOPIC CHOLECYSTECTOMY  06/26/2001   @[facility] ;  WITH OPEN LEFT INGUINAL HERNIA REPAIR  . LUMBAR LAMINECTOMY/DECOMPRESSION MICRODISCECTOMY N/A 04/23/2018   Procedure: Microlumbar decompression Lumbar five-Sacral one;  Surgeon:  Jene Every, MD;  Location: [facility] OR;  Service: Orthopedics;  Laterality: N/A;  . PACEMAKER IMPLANT N/A 01/23/2022   Procedure: PACEMAKER IMPLANT;  Surgeon: Duke Salvia, MD;  Location: [facility] INVASIVE CV LAB;  Service: Cardiovascular;  Laterality: N/A;  . PROSTATE BIOPSY  09/20/2020  . RADIOACTIVE SEED IMPLANT N/A 02/09/2021   Procedure: RADIOACTIVE SEED IMPLANT/BRACHYTHERAPY IMPLANT;  Surgeon: Bjorn Pippin, MD;  Location: [facility];  Service: Urology;  Laterality: N/A;  56 seeds  . RIGHT/LEFT HEART CATH AND CORONARY ANGIOGRAPHY N/A 10/16/2017   Procedure: RIGHT/LEFT HEART CATH AND CORONARY ANGIOGRAPHY;  Surgeon: Tonny Bollman, MD;  Location[facility] INVASIVE CV LAB;  Service: Cardiovascular;  Laterality: N/A;  . SPACE OAR INSTILLATION N/A 02/09/2021   Procedure: SPACE OAR INSTILLATION;  Surgeon: Bjorn Pippin, MD;  Location: [facility];  Service: Urology;  Laterality: N/A;  . TEE WITHOUT CARDIOVERSION N/A 12/17/2017   Procedure: TRANSESOPHAGEAL ECHOCARDIOGRAM (TEE);  Surgeon: Purcell Nails, MD;  Location[facility] OR;  Service: Open Heart Surgery;   Laterality: N/A;  . TEMPORARY PACEMAKER N/A 01/17/2022   Procedure: TEMPORARY PACEMAKER;  Surgeon: Lyn Records, MD;  Location: [facility] INVASIVE CV LAB;  Service: Cardiovascular;  Laterality: N/A;  . TONSILLECTOMY  1960  . TUMOR REMOVAL  06/30/2023   on back, sent for biopsy   Social History   Social History Narrative   ** Merged History Encounter **       Lives w/ wife 3 children, 8 g-kids        Immunization History  Administered Date(s) Administered  . Fluad Quad(high Dose 65+) 02/12/2019, 03/14/2020, 05/15/2021, 03/14/2022  . Fluad Trivalent(High Dose 65+) 02/18/2023  . Influenza Split 03/11/2013, 02/17/2014  . Influenza Whole 04/13/2007, 03/15/2008, 03/10/2012  . Influenza, High Dose Seasonal PF 04/04/2015, 03/28/2016, 03/18/2017, 03/16/2018  . PFIZER(Purple Top)SARS-COV-2 Vaccination 08/08/2019, 08/31/2019, 03/24/2020  . PNEUMOCOCCAL CONJUGATE-20 06/26/2021  . Pneumococcal Conjugate-13 04/21/2014  . Pneumococcal Polysaccharide-23 04/15/2012  . Td 06/10/1996, 06/10/2000  . Tdap 04/15/2011, 10/11/2018  . Zoster, Live 03/29/2015     Objective: Vital Signs: BP (!) 159/90 (BP Location: Left Arm, Patient Position: Sitting, Cuff Size: Normal)   Pulse 67   Resp 15   Ht 5' 7.5" (1.715 m)   Wt 193 lb 12.8 oz (87.9 kg)   BMI 29.91 kg/m    Physical Exam Vitals and nursing note reviewed.  Constitutional:      Appearance: He is well-developed.  HENT:     Head: Normocephalic and atraumatic.  Eyes:     Conjunctiva/sclera: Conjunctivae normal.     Pupils: Pupils are equal, round, and reactive to light.  Cardiovascular:     Rate and Rhythm: Normal rate and regular rhythm.     Heart sounds: Normal heart sounds.  Pulmonary:     Effort: Pulmonary effort is normal.     Breath sounds: Normal breath sounds.  Abdominal:     General: Bowel sounds are normal.     Palpations: Abdomen is soft.  Musculoskeletal:     Cervical back: Normal range of motion and neck supple.  Skin:     General: Skin is warm and dry.     Capillary Refill: Capillary refill takes less than 2 seconds.  Neurological:     Mental Status: He is alert and oriented to person, place, and time.  Psychiatric:        Behavior: Behavior normal.     Musculoskeletal Exam: Patient had some limitation with lateral rotation of the cervical spine.  There was no tenderness over thoracic or lumbar spine.  Shoulders, elbows, wrists, MCPs PIPs and DIPs Juengel range of motion.  Bilateral PIP and DIP thickening with no synovitis was noted.  Hip joints were in good range of motion without any discomfort.  Knee joints were in good range of motion without any warmth swelling or effusion.  There was no tenderness over ankles or MTPs.  CDAI Exam: CDAI Score: -- Patient Global: 10 / 100; Provider Global: 10 / 100 Swollen: --; Tender: -- Joint Exam 07/02/2023   No joint exam has been documented for this visit   There is currently no information documented on the homunculus. Go to the Rheumatology activity and complete the homunculus joint exam.  Investigation: No additional findings.  Imaging: No results found.  Recent Labs: Lab Results  Component Value Date   WBC 4.7 05/28/2023   HGB 14.2 05/28/2023   PLT 195 05/28/2023   NA 140 05/28/2023   K 4.4 05/28/2023   CL 102 05/28/2023   CO2 30 05/28/2023   GLUCOSE 111 (H) 05/28/2023   BUN 12 05/28/2023   CREATININE 1.17 05/28/2023   BILITOT 0.9 05/28/2023   ALKPHOS 58 01/20/2022   AST 30 05/28/2023   ALT 20 05/28/2023   PROT 7.0 05/28/2023   ALBUMIN 3.1 (L) 01/20/2022   CALCIUM 9.6 05/28/2023   GFRAA 71 11/13/2020   QFTBGOLDPLUS NEGATIVE 08/24/2018    Speciality Comments: No specialty comments available.  Procedures:  No procedures performed Allergies: Atorvastatin, Ezetimibe, and Zetia [ezetimibe]   Assessment / Plan:     Visit Diagnoses: Seronegative rheumatoid arthritis (HCC)-patient has been doing well on methotrexate 3 tablets p.o. weekly  and folic acid once a day.  He denies any flares of rheumatoid arthritis since the last visit.  He had been doing routine activities without any stiffness or swelling.  He had no interruption in the treatment since the last visit.  High risk medication use - Methotrexate 3 tablets by mouth once weekly and folic acid 1 mg po daily.  May 28, 2023 CBC and CMP were normal.  Which were reviewed with the patient.  Patient was advised to get labs every 3 months.  Information on immunization was placed in the AVS.  He was advised to hold methotrexate if he develops an infection resume after the infection resolves.  Primary osteoarthritis of both hands-PIP and DIP thickening with no synovitis was noted.  Joint protection was discussed.  Primary osteoarthritis of both knees-he had good range of motion of bilateral knee joints without any warmth swelling or effusion.  Lower extremity muscle strengthening exercises were demonstrated and handout was given.  Primary osteoarthritis of both feet-proper fitting shoes were advised.  Spinal stenosis of lumbar region, unspecified whether neurogenic claudication present-he denies any discomfort or radiculopathy today.  Core strength exercises were discussed.   Osteopenia of multiple sites - DEXA 04/23/2019 RFN BMD 0.776 with T score -1.1.  DEXA updated on 04/03/22: LFN BMD 0.744 with T-score -1.0.  On vitamin D 2000 units daily.  Will repeat DEXA scan in 2028.  Calcium rich diet and exercise was emphasized.  Essential hypertension-blood pressure was elevated at 159/90.  He was advised to monitor blood pressure mostly and follow-up with his PCP.  Other medical problems are listed as follows:  Left bundle branch block (LBBB)  Pacemaker  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  History of hypothyroidism  Malignant neoplasm of prostate (HCC)  History of anxiety  Family history  of psoriasis  History of basal cell cancer -  Left ear.  He is scheduled to have it removed next week.  He had another skin lesion removed from his back last week which is healing well.  Use of sun protection and sunscreen was advised.  Orders: No orders of the defined types were placed in this encounter.  No orders of the defined types were placed in this encounter.    Follow-Up Instructions: Return in about 5 months (around 11/30/2023) for Rheumatoid arthritis, Osteoarthritis.   Pollyann Savoy, MD  Note - This record has been created using Animal nutritionist.  Chart creation errors have been sought, but may not always  have been located. Such creation errors do not reflect on  the standard of medical care.

## 2023-06-24 ENCOUNTER — Encounter: Payer: Self-pay | Admitting: Internal Medicine

## 2023-06-26 ENCOUNTER — Other Ambulatory Visit: Payer: Self-pay | Admitting: Internal Medicine

## 2023-06-30 HISTORY — PX: TUMOR REMOVAL: SHX12

## 2023-07-02 ENCOUNTER — Encounter: Payer: Self-pay | Admitting: Rheumatology

## 2023-07-02 ENCOUNTER — Ambulatory Visit: Payer: Medicare Other | Attending: Rheumatology | Admitting: Rheumatology

## 2023-07-02 VITALS — BP 159/90 | HR 67 | Resp 15 | Ht 67.5 in | Wt 193.8 lb

## 2023-07-02 DIAGNOSIS — M0600 Rheumatoid arthritis without rheumatoid factor, unspecified site: Secondary | ICD-10-CM

## 2023-07-02 DIAGNOSIS — Z953 Presence of xenogenic heart valve: Secondary | ICD-10-CM

## 2023-07-02 DIAGNOSIS — Z840 Family history of diseases of the skin and subcutaneous tissue: Secondary | ICD-10-CM

## 2023-07-02 DIAGNOSIS — Z85828 Personal history of other malignant neoplasm of skin: Secondary | ICD-10-CM

## 2023-07-02 DIAGNOSIS — I447 Left bundle-branch block, unspecified: Secondary | ICD-10-CM

## 2023-07-02 DIAGNOSIS — M170 Bilateral primary osteoarthritis of knee: Secondary | ICD-10-CM

## 2023-07-02 DIAGNOSIS — M48061 Spinal stenosis, lumbar region without neurogenic claudication: Secondary | ICD-10-CM

## 2023-07-02 DIAGNOSIS — M19042 Primary osteoarthritis, left hand: Secondary | ICD-10-CM

## 2023-07-02 DIAGNOSIS — I10 Essential (primary) hypertension: Secondary | ICD-10-CM

## 2023-07-02 DIAGNOSIS — Z8659 Personal history of other mental and behavioral disorders: Secondary | ICD-10-CM

## 2023-07-02 DIAGNOSIS — C61 Malignant neoplasm of prostate: Secondary | ICD-10-CM

## 2023-07-02 DIAGNOSIS — M19071 Primary osteoarthritis, right ankle and foot: Secondary | ICD-10-CM

## 2023-07-02 DIAGNOSIS — Z950 Presence of cardiac pacemaker: Secondary | ICD-10-CM

## 2023-07-02 DIAGNOSIS — M19041 Primary osteoarthritis, right hand: Secondary | ICD-10-CM

## 2023-07-02 DIAGNOSIS — M8589 Other specified disorders of bone density and structure, multiple sites: Secondary | ICD-10-CM

## 2023-07-02 DIAGNOSIS — Z8639 Personal history of other endocrine, nutritional and metabolic disease: Secondary | ICD-10-CM

## 2023-07-02 DIAGNOSIS — M19072 Primary osteoarthritis, left ankle and foot: Secondary | ICD-10-CM

## 2023-07-02 DIAGNOSIS — Z79899 Other long term (current) drug therapy: Secondary | ICD-10-CM

## 2023-07-02 NOTE — Patient Instructions (Addendum)
Standing Labs We placed an order today for your standing lab work.   Please have your standing labs drawn in arch and every 3 months  Please have your labs drawn 2 weeks prior to your appointment so that the provider can discuss your lab results at your appointment, if possible.  Please note that you may see your imaging and lab results in MyChart before we have reviewed them. We will contact you once all results are reviewed. Please allow our office up to 72 hours to thoroughly review all of the results before contacting the office for clarification of your results.  WALK-IN LAB HOURS  Monday through Thursday from 8:00 am -12:30 pm and 1:00 pm-5:00 pm and Friday from 8:00 am-12:00 pm.  Patients with office visits requiring labs will be seen before walk-in labs.  You may encounter longer than normal wait times. Please allow additional time. Wait times may be shorter on  Monday and Thursday afternoons.  We do not book appointments for walk-in labs. We appreciate your patience and understanding with our staff.   Labs are drawn by [facility]. Please bring your co-pay at the time of your lab draw.  You may receive a bill from [facility] for your lab work.  Please note if you are on Hydroxychloroquine and and an order has been placed for a Hydroxychloroquine level,  you will need to have it drawn 4 hours or more after your last dose.  If you wish to have your labs drawn at another location, please call the office 24 hours in advance so we can fax the orders.  The office is located at [address], [place], [place] [address]   If you have any questions regarding directions or hours of operation,  please call [phone].   As a reminder, please drink plenty of water prior to coming for your lab work. Thanks!   Vaccines You are taking a medication(s) that can suppress your immune system.  The following immunizations are recommended: Flu annually Covid-19  RSV Td/Tdap (tetanus,  diphtheria, pertussis) every 10 years Pneumonia (Prevnar 15 then Pneumovax 23 at least 1 year apart.  Alternatively, can take Prevnar 20 without needing additional dose) Shingrix: 2 doses from 4 weeks to 6 months apart  Please check with your PCP to make sure you are up to date.   If you have signs or symptoms of an infection or start antibiotics: First, call your PCP for workup of your infection. Hold your medication through the infection, until you complete your antibiotics, and until symptoms resolve if you take the following: Injectable medication (Actemra, Benlysta, Cimzia, Cosentyx, Enbrel, Humira, Kevzara, Orencia, Remicade, Simponi, Stelara, Taltz, Tremfya) Methotrexate Leflunomide (Arava) Mycophenolate (Cellcept) [name], or [name]   Exercises for Chronic Knee Pain Chronic knee pain is pain that lasts longer than 3 months. For most people with chronic knee pain, exercise and weight loss is an important part of treatment. Your health care provider may want you to focus on: Making the muscles that support your knee stronger. This can take pressure off your knee and reduce pain. Preventing knee stiffness. How far you can move your knee, keeping it there or making it farther. Losing weight (if this applies) to take pressure off your knee, lower your risk for injury, and make it easier for you to exercise. Your provider will help you make an exercise program that fits your needs and physical abilities. Below are simple, low-impact exercises you can do at home. Ask your provider  or physical therapist how often you should do your exercise program and how many times to repeat each exercise. General safety tips  Get your provider's approval before doing any exercises. Start slowly and stop any time you feel pain. Do not exercise if your knee pain is flaring up. Warm up first. Stretching a cold muscle can cause an injury. Do 5-10 minutes of easy movement or light stretching before  beginning your exercises. Do 5-10 minutes of low-impact activity (like walking or cycling) before starting strengthening exercises. Contact your provider any time you have pain during or after exercising. Exercise can cause discomfort but should not be painful. It is normal to be a little stiff or sore after exercising. Stretching and range-of-motion exercises Front thigh stretch  Stand up straight and support your body by holding on to a chair or resting one hand on a wall. With your legs straight and close together, bend one knee to lift your heel up toward your butt. Using one hand for support, grab your ankle with your free hand. Pull your foot up closer toward your butt to feel the stretch in front of your thigh. Hold the stretch for 30 seconds. Repeat __________ times. Complete this exercise __________ times a day. Back thigh stretch  Sit on the floor with your back straight and your legs out straight in front of you. Place the palms of your hands on the floor and slide them toward your feet as you bend at the hip. Try to touch your nose to your knees and feel the stretch in the back of your thighs. Hold for 30 seconds. Repeat __________ times. Complete this exercise __________ times a day. Calf stretch  Stand facing a wall. Place the palms of your hands flat against the wall, arms extended, and lean slightly against the wall. Get into a lunge position with one leg bent at the knee and the other leg stretched out straight behind you. Keep both feet facing the wall and increase the bend in your knee while keeping the heel of the other leg flat on the ground. You should feel the stretch in your calf. Hold for 30 seconds. Repeat __________ times. Complete this exercise __________ times a day. Strengthening exercises Straight leg lift  Lie on your back with one knee bent and the other leg out straight. Slowly lift the straight leg without bending the knee. Lift until your foot is  about 12 inches (30 cm) off the floor. Hold for 3-5 seconds and slowly lower your leg. Repeat __________ times. Complete this exercise __________ times a day. Single leg dip  Stand between two chairs and put both hands on the backs of the chairs for support. Extend one leg out straight with your body weight resting on the heel of the standing leg. Slowly bend your standing knee to dip your body to the level that is comfortable for you. Hold for 3-5 seconds. Repeat __________ times. Complete this exercise __________ times a day. Hamstring curls  Stand straight, knees close together, facing the back of a chair. Hold on to the back of a chair with both hands. Keep one leg straight. Bend the other knee while bringing the heel up toward the butt until the knee is bent at a 90-degree angle (right angle). Hold for 3-5 seconds. Repeat __________ times. Complete this exercise __________ times a day. Wall squat  Stand straight with your back, hips, and head against a wall. Step forward one foot at a time with your back  still against the wall. Your feet should be 2 feet (61 cm) from the wall at shoulder width. Keeping your back, hips, and head against the wall, slide down the wall to as close to a sitting position as you can get. Hold for 5-10 seconds, then slowly slide back up. Repeat __________ times. Complete this exercise __________ times a day. Step-ups  Stand in front of a sturdy platform or stool that is about 6 inches (15 cm) high. Slowly step up with your left / right foot, keeping your knee in line with your hip and foot. Do not let your knee bend so far that you cannot see your toes. Hold on to a chair for balance, but do not use it for support. Slowly unlock your knee and lower yourself to the starting position. Repeat __________ times. Complete this exercise __________ times a day. Contact a health care provider if: Your exercises cause pain. Your pain is worse after you  exercise. Your pain prevents you from doing your exercises. This information is not intended to replace advice given to you by your health care provider. Make sure you discuss any questions you have with your health care provider. Document Revised: 06/11/2022 Document Reviewed: 06/11/2022 Elsevier Patient Education  2024 [employer].

## 2023-07-18 ENCOUNTER — Other Ambulatory Visit: Payer: Self-pay | Admitting: Physician Assistant

## 2023-07-19 ENCOUNTER — Other Ambulatory Visit: Payer: Self-pay | Admitting: Internal Medicine

## 2023-07-24 ENCOUNTER — Ambulatory Visit (INDEPENDENT_AMBULATORY_CARE_PROVIDER_SITE_OTHER): Payer: Medicare Other

## 2023-07-24 DIAGNOSIS — I442 Atrioventricular block, complete: Secondary | ICD-10-CM

## 2023-07-25 LAB — CUP PACEART REMOTE DEVICE CHECK
Battery Remaining Longevity: 107 mo
Battery Remaining Percentage: 90 %
Battery Voltage: 3.02 V
Brady Statistic AP VP Percent: 1 %
Brady Statistic AP VS Percent: 58 %
Brady Statistic AS VP Percent: 0 %
Brady Statistic AS VS Percent: 42 %
Brady Statistic RA Percent Paced: 58 %
Brady Statistic RV Percent Paced: 1 %
Date Time Interrogation Session: 20250213025007
Implantable Lead Connection Status: 753985
Implantable Lead Connection Status: 753985
Implantable Lead Implant Date: 20230816
Implantable Lead Implant Date: 20230816
Implantable Lead Location: 753859
Implantable Lead Location: 753860
Implantable Lead Model: 3830
Implantable Pulse Generator Implant Date: 20230816
Lead Channel Impedance Value: 480 Ohm
Lead Channel Impedance Value: 610 Ohm
Lead Channel Pacing Threshold Amplitude: 0.75 V
Lead Channel Pacing Threshold Amplitude: 1 V
Lead Channel Pacing Threshold Pulse Width: 0.5 ms
Lead Channel Pacing Threshold Pulse Width: 0.5 ms
Lead Channel Sensing Intrinsic Amplitude: 1 mV
Lead Channel Sensing Intrinsic Amplitude: 12 mV
Lead Channel Setting Pacing Amplitude: 2 V
Lead Channel Setting Pacing Amplitude: 2.5 V
Lead Channel Setting Pacing Pulse Width: 0.5 ms
Lead Channel Setting Sensing Sensitivity: 2 mV
Pulse Gen Model: 2272
Pulse Gen Serial Number: 8101539

## 2023-08-05 ENCOUNTER — Encounter: Payer: Self-pay | Admitting: Internal Medicine

## 2023-08-05 ENCOUNTER — Ambulatory Visit (INDEPENDENT_AMBULATORY_CARE_PROVIDER_SITE_OTHER): Payer: Medicare Other | Admitting: Internal Medicine

## 2023-08-05 VITALS — BP 128/66 | HR 65 | Temp 98.0°F | Resp 12 | Ht 67.5 in | Wt 192.0 lb

## 2023-08-05 DIAGNOSIS — R739 Hyperglycemia, unspecified: Secondary | ICD-10-CM

## 2023-08-05 DIAGNOSIS — Z0000 Encounter for general adult medical examination without abnormal findings: Secondary | ICD-10-CM | POA: Diagnosis not present

## 2023-08-05 DIAGNOSIS — E7800 Pure hypercholesterolemia, unspecified: Secondary | ICD-10-CM | POA: Diagnosis not present

## 2023-08-05 LAB — HEMOGLOBIN A1C: Hgb A1c MFr Bld: 5.9 % (ref 4.6–6.5)

## 2023-08-05 LAB — LIPID PANEL
Cholesterol: 136 mg/dL (ref 0–200)
HDL: 49.6 mg/dL (ref >39.00)
LDL Cholesterol: 59 mg/dL (ref 0–99)
NonHDL: 86.61
Total CHOL/HDL Ratio: 3
Triglycerides: 136 mg/dL (ref 0.0–149.0)
VLDL: 27.2 mg/dL (ref 0.0–40.0)

## 2023-08-05 NOTE — Progress Notes (Unsigned)
 Subjective:    Patient ID: Eric [name], male    DOB: August 18, 1945, 78 y.o.   MRN: [id number]  DOS:  08/05/2023 Type of visit - description: cpx Here for CPX. Feeling well.  Review of Systems   A 14 point review of systems is negative    Past Medical History:  Diagnosis Date   Full dentures    Heart murmur    History of aortic valve stenosis 2011   06/ 2011 dx mild bicuspid stenosis;    then 04/ 2019 severe  stenosis   History of COVID-19 04/23/2019   positive result in epic;  per pt asymptomatic   History of subarachnoid hemorrhage 04/27/2019   hospital admission in epic, dx bilateral frontal SAH w/ left occipital skull fx nondisplaced due to syncope w/ fall secondary to dehydration/ ortho hypotension   History of supraventricular tachycardia 12/2017   followed by cardiology   Hyperlipidemia    Hypertension    followed by cardiology and pcp   Hypothyroidism    followed by pcp   Left bundle branch block (LBBB) 12/18/2017   post op AV replacement 12-17-2017   Lower urinary tract symptoms (LUTS)    Macular edema of right eye    Dr  Stephannie Li    OA (osteoarthritis)    multiple sites , followed by dr s. Corliss Skains   Prostate cancer [facility])    urologist--- dr Annabell Howells--- dx 04/ 2022, Gleason 3+4, PSA 3.76   Rheumatoid arthritis (HCC) 11/17/2018   rheumotologist-- dr s. 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 12/17/2017   Edwards Intuity Elite rapid deployment stented bovine pericardial tissue valve via right mini thoracotomy approach   SOB (shortness of breath)    per pt occasionally with yard work, but ok with stairs and normal activities   Spinal stenosis of lumbar region 04/23/2018   Wide-complex tachycardia 12/30/2017   (followed by dr Dulce Sellar)  post cardioversion by EMS 12-30-2017 for SVT 170s post op AV replacement 12-17-2017 , CHF, AKI    Past Surgical History:  Procedure Laterality Date   AORTIC  VALVE REPLACEMENT N/A 12/17/2017   Procedure: MINIMALLY INVASIVE AORTIC VALVE REPLACEMENT (AVR) [ using Edwards Intuity Valve Size 23mm;  Surgeon: Purcell Nails, MD;  Location: [facility] OR;  Service: Open Heart Surgery;  Laterality: N/A;  MINI THORACOTOMY   BASAL CELL CARCINOMA EXCISION Left 06/05/2023   left ear   CARDIAC CATHETERIZATION  11/10/2000   @[facility]  by dr Graciela Husbands;  normal coronary angiography and normal LVF   CATARACT EXTRACTION W/ INTRAOCULAR LENS IMPLANT Right 2018   COLONOSCOPY     last one 02-29-2020 by dr h. danis   CYSTOSCOPY N/A 02/09/2021   Procedure: Bluford Kaufmann;  Surgeon: Bjorn Pippin, MD;  Location: [facility];  Service: Urology;  Laterality: N/A;   INGUINAL HERNIA REPAIR Right 2008   LAPAROSCOPIC CHOLECYSTECTOMY  06/26/2001   @[facility] ;  WITH OPEN LEFT INGUINAL HERNIA REPAIR   LUMBAR LAMINECTOMY/DECOMPRESSION MICRODISCECTOMY N/A 04/23/2018   Procedure: Microlumbar decompression Lumbar five-Sacral one;  Surgeon: Jene Every, MD;  Location: [facility] OR;  Service: Orthopedics;  Laterality: N/A;   PACEMAKER IMPLANT N/A 01/23/2022   Procedure: PACEMAKER IMPLANT;  Surgeon: Duke Salvia, MD;  Location: [facility] INVASIVE CV LAB;  Service: Cardiovascular;  Laterality: N/A;   PROSTATE BIOPSY  09/20/2020   RADIOACTIVE SEED IMPLANT N/A 02/09/2021   Procedure: RADIOACTIVE SEED IMPLANT/BRACHYTHERAPY IMPLANT;  Surgeon: Bjorn Pippin, MD;  Location: [facility] [place];  Service: Urology;  Laterality: N/A;  56 seeds   RIGHT/LEFT HEART CATH AND CORONARY ANGIOGRAPHY N/A 10/16/2017   Procedure: RIGHT/LEFT HEART CATH AND CORONARY ANGIOGRAPHY;  Surgeon: Tonny Bollman, MD;  Location: [facility] INVASIVE CV LAB;  Service: Cardiovascular;  Laterality: N/A;   SPACE OAR INSTILLATION N/A 02/09/2021   Procedure: SPACE OAR INSTILLATION;  Surgeon: Bjorn Pippin, MD;  Location: [facility];  Service: Urology;  Laterality: N/A;   TEE WITHOUT CARDIOVERSION N/A 12/17/2017   Procedure:  TRANSESOPHAGEAL ECHOCARDIOGRAM (TEE);  Surgeon: Purcell Nails, MD;  Location: [facility] OR;  Service: Open Heart Surgery;  Laterality: N/A;   TEMPORARY PACEMAKER N/A 01/17/2022   Procedure: TEMPORARY PACEMAKER;  Surgeon: Lyn Records, MD;  Location: [facility] INVASIVE CV LAB;  Service: Cardiovascular;  Laterality: N/A;   TONSILLECTOMY  1960   TUMOR REMOVAL  06/30/2023   on back, sent for biopsy   Social History   Socioeconomic History   Marital status: Married    Spouse name: Not on file   Number of children: 3   Years of education: Not on file   Highest education level: Not on file  Occupational History   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    Comment: 05/2008  Tobacco Use   Smoking status: Former    Current packs/day: 0.00    Average packs/day: 2.0 packs/day for 35.0 years (70.0 ttl pk-yrs)    Types: Cigarettes    Start date: 04/16/1955    Quit date: 04/15/1990    Years since quitting: 33.3    Passive exposure: Never   Smokeless tobacco: Never  Vaping Use   Vaping status: Never Used  Substance and Sexual Activity   Alcohol use: Not Currently    Comment: no ETOH since the 90s   Drug use: Never   Sexual activity: Not Currently  Other Topics Concern   Not on file  Social History Narrative    Lives w/ wife   3 children, 7 g-kids ,  1g-g-child             Social Drivers of Health   Financial Resource Strain: Patient Declined (10/24/2022)   Overall Financial Resource Strain (CARDIA)    Difficulty of Paying Living Expenses: Patient declined  Food Insecurity: Patient Declined (10/24/2022)   Hunger Vital Sign    Worried About Running Out of Food in the Last Year: Patient declined    Ran Out of Food in the Last Year: Patient declined  Transportation Needs: No Transportation Needs (10/24/2022)   PRAPARE - Administrator, Civil Service (Medical): No    Lack of Transportation (Non-Medical): No  Physical Activity: Unknown (10/24/2022)   Exercise Vital Sign     Days of Exercise per Week: Patient declined    Minutes of Exercise per Session: Not on file  Stress: No Stress Concern Present (07/09/2021)   [employer] of Occupational Health - Occupational Stress Questionnaire    Feeling of Stress : Not at all  Social Connections: Unknown (10/24/2022)   Social Connection and Isolation Panel [NHANES]    Frequency of Communication with Friends and Family: Patient declined    Frequency of Social Gatherings with Friends and Family: Patient declined    Attends Religious Services: Patient declined    Database administrator or Organizations: Patient declined    Attends Banker Meetings: Not on file    Marital Status: Married  Intimate Partner Violence: Not At Risk (07/09/2021)   Humiliation, Afraid, Rape, and  Kick questionnaire    Fear of Current or Ex-Partner: No    Emotionally Abused: No    Physically Abused: No    Sexually Abused: No     Current Outpatient Medications  Medication Instructions   acetaminophen (TYLENOL) 500 mg, Every 6 hours PRN   aspirin EC 81 mg, Daily   carvedilol (COREG) 12.5 MG tablet TAKE 1 TABLET (12.5MG  TOTAL) BY MOUTH TWICE A DAY WITH MEALS   fluticasone (FLONASE) 50 MCG/ACT nasal spray 2 sprays, Each Nare, Daily   folic acid (FOLVITE) 1 mg, Oral, Daily   levothyroxine (SYNTHROID) 88 mcg, Oral, Daily before breakfast   methotrexate (RHEUMATREX) 2.5 MG tablet TAKE 3 TABLETS BY MOUTH ONCE A WEEK   rosuvastatin (CRESTOR) 20 mg, Oral, Daily at bedtime   Vitamin D3 2,000 Units, Daily       Objective:   Physical Exam BP 128/66 (BP Location: Left Arm, Patient Position: Sitting, Cuff Size: Normal)   Pulse 65   Temp 98 F (36.7 C) (Oral)   Resp 12   Ht 5' 7.5" (1.715 m)   Wt 192 lb (87.1 kg)   SpO2 94%   BMI 29.63 kg/m  General: Well developed, NAD, BMI noted Neck: No  thyromegaly  HEENT:  Normocephalic . Face symmetric, atraumatic Lungs:  CTA B Normal respiratory effort, no intercostal retractions,  no accessory muscle use. Heart: RRR,  no murmur.  Abdomen:  Not distended, soft, non-tender. No rebound or rigidity.   Lower extremities: no pretibial edema bilaterally  Skin: Exposed areas without rash. Not pale. Not jaundice Neurologic:  alert & oriented X3.  Speech normal, gait appropriate for age and unassisted Strength symmetric and appropriate for age.  Psych: Cognition and judgment appear intact.  Cooperative with normal attention span and concentration.  Behavior appropriate. No anxious or depressed appearing.     Assessment    Problem list HTN Hyperlipidemia Hypothyroidism R.A. dx 2020 DJD Increase LFTs Ultrasound 2002 done for increased LFTs: Normal liver; Hep C negative 02-16-15 CV: --Aortic stenosis , dx  2012 , s/p AoVR 12/2017 --Fusiform infrarenal abdominal aortic ectasis: -- FF,  Brother has CAD dx age 7s --Cardiac cath  10/2017, minimal dz -- cardiac arrest 2023, pacemaker  Macular degeneration, cataracts  Subarachnoid hemorrhage, left orbital contusion, left occipital fracture 2020 Prostate cancer, Dx 09-2020 Microscopic hematuria: Had a cystoscopy and a CT at urology  PLAN Here for CPX -Td 2020 -PNM 23: 2013;  Prevnar: 2015; PNM 20: 2023;  zostavax:2016 -Had a flu shot -Vaccines I recommend: shingrix, RSV , COVID vax -CCS: per  patient: Cscope x 2, last  04-2006, cscope 12- 2017, C-scope 02-2020, no f/u d/t age per GI letter  -Prostate cancer dx 4-/2022.  per urology  - Labs : FLP A1c -diet and exercise: counseled  -ACP: Documents charted. Other issues discussed today: Hyperglycemia: Mild, check A1c. HTN: Seems well-controlled, recommend to check at home, Hyperlipidemia: Last LFTs normal, continue Crestor, check FLP. Hypothyroidism: Last TSH very good, recheck on RTC Cardiovascular: Asymptomatic.   Intercostal neuritis?  Was referred to pain management, by the time he was seen pain was gone. Skin cancer: See last visit, had a left ear lesion, was  biopsied by dermatology, to have further surgery soon Prostate cancer: Saw urology 06-2023, PSA was 0.1.  Next visit 6 months. RTC 6 months

## 2023-08-05 NOTE — Patient Instructions (Addendum)
 Vaccines I am asking you to consider: Shingrix RSV COVID-vaccine  Check the  blood pressure regularly Blood pressure goal:  between 110/65 and  135/85. If it is consistently higher or lower, let me know     GO TO THE LAB : Get the blood work     Next visit with me in 6 months Please schedule it at the front desk

## 2023-08-06 ENCOUNTER — Encounter: Payer: Self-pay | Admitting: Internal Medicine

## 2023-08-06 NOTE — Assessment & Plan Note (Signed)
 Here for CPX Other issues discussed today: Hyperglycemia: Mild, check A1c. HTN: Seems well-controlled, recommend to check at home, Hyperlipidemia: Last LFTs normal, continue Crestor, check FLP. Hypothyroidism: Last TSH very good, recheck on RTC Cardiovascular: Asymptomatic.   Intercostal neuritis?  Was referred to pain management, by the time he was seen pain was gone. Skin cancer: See last visit, had a left ear lesion, was biopsied by dermatology, to have further surgery soon Prostate cancer: Saw urology 06-2023, PSA was 0.1.  Next visit 6 months. RTC 6 months

## 2023-08-06 NOTE — Assessment & Plan Note (Signed)
 Here for CPX -Td 2020 -PNM 23: 2013;  Prevnar: 2015; PNM 20: 2023;  zostavax:2016 -Had a flu shot -Vaccines I recommend: shingrix, RSV , COVID vax -CCS: per  patient: Cscope x 2, last  04-2006, cscope 12- 2017, C-scope 02-2020, no f/u d/t age per GI letter  -Prostate cancer dx 4-/2022.  per urology  - Labs : FLP A1c -diet and exercise: counseled  -ACP: Documents charted.

## 2023-08-11 ENCOUNTER — Encounter: Payer: Self-pay | Admitting: Internal Medicine

## 2023-08-25 ENCOUNTER — Other Ambulatory Visit: Payer: Self-pay | Admitting: *Deleted

## 2023-08-25 DIAGNOSIS — Z79899 Other long term (current) drug therapy: Secondary | ICD-10-CM

## 2023-08-26 ENCOUNTER — Other Ambulatory Visit: Payer: Self-pay | Admitting: *Deleted

## 2023-08-26 LAB — COMPLETE METABOLIC PANEL WITH GFR
AG Ratio: 1.7 (calc) (ref 1.0–2.5)
ALT: 22 U/L (ref 9–46)
AST: 27 U/L (ref 10–35)
Albumin: 4.3 g/dL (ref 3.6–5.1)
Alkaline phosphatase (APISO): 67 U/L (ref 35–144)
BUN: 13 mg/dL (ref 7–25)
CO2: 29 mmol/L (ref 20–32)
Calcium: 9.2 mg/dL (ref 8.6–10.3)
Chloride: 100 mmol/L (ref 98–110)
Creat: 1.22 mg/dL (ref 0.70–1.28)
Globulin: 2.5 g/dL (ref 1.9–3.7)
Glucose, Bld: 105 mg/dL — ABNORMAL HIGH (ref 65–99)
Potassium: 4.2 mmol/L (ref 3.5–5.3)
Sodium: 139 mmol/L (ref 135–146)
Total Bilirubin: 1.2 mg/dL (ref 0.2–1.2)
Total Protein: 6.8 g/dL (ref 6.1–8.1)
eGFR: 61 mL/min/{1.73_m2} (ref >=60)

## 2023-08-26 LAB — CBC WITH DIFFERENTIAL/PLATELET
Absolute Lymphocytes: 1066 {cells}/uL (ref 850–3900)
Absolute Monocytes: 504 {cells}/uL (ref 200–950)
Basophils Absolute: 10 {cells}/uL (ref 0–200)
Basophils Relative: 0.2 %
Eosinophils Absolute: 62 {cells}/uL (ref 15–500)
Eosinophils Relative: 1.3 %
HCT: 43.6 % (ref 38.5–50.0)
Hemoglobin: 14.4 g/dL (ref 13.2–17.1)
MCH: 32.1 pg (ref 27.0–33.0)
MCHC: 33 g/dL (ref 32.0–36.0)
MCV: 97.3 fL (ref 80.0–100.0)
MPV: 9.5 fL (ref 7.5–12.5)
Monocytes Relative: 10.5 %
Neutro Abs: 3158 {cells}/uL (ref 1500–7800)
Neutrophils Relative %: 65.8 %
Platelets: 184 10*3/uL (ref 140–400)
RBC: 4.48 10*6/uL (ref 4.20–5.80)
RDW: 13.1 % (ref 11.0–15.0)
Total Lymphocyte: 22.2 %
WBC: 4.8 10*3/uL (ref 3.8–10.8)

## 2023-08-26 MED ORDER — METHOTREXATE SODIUM 2.5 MG PO TABS: 7.5000 mg | ORAL_TABLET | ORAL | 0 refills | Status: DC

## 2023-08-26 NOTE — Progress Notes (Signed)
 CBC and CMP are normal.

## 2023-08-26 NOTE — Telephone Encounter (Signed)
 Last Fill: 06/05/2023   Labs: 08/25/2023 CBC and CMP are normal.   Next Visit: 12/17/2023  Last Visit: 07/02/2023   DX: Seronegative rheumatoid arthritis   Current Dose per office note 07/02/2023: Methotrexate 3 tablets by mouth once weekly   Okay to refill Methotrexate?

## 2023-08-27 NOTE — Addendum Note (Signed)
 Addended by: Elease Etienne A on: 08/27/2023 03:31 PM   Modules accepted: Orders

## 2023-08-27 NOTE — Progress Notes (Signed)
 Remote pacemaker transmission.

## 2023-09-09 ENCOUNTER — Ambulatory Visit: Payer: Medicare Other | Admitting: Cardiology

## 2023-10-17 ENCOUNTER — Other Ambulatory Visit: Payer: Self-pay | Admitting: Cardiology

## 2023-10-23 ENCOUNTER — Ambulatory Visit (INDEPENDENT_AMBULATORY_CARE_PROVIDER_SITE_OTHER): Payer: Medicare Other

## 2023-10-23 DIAGNOSIS — I442 Atrioventricular block, complete: Secondary | ICD-10-CM

## 2023-10-23 LAB — CUP PACEART REMOTE DEVICE CHECK
Battery Remaining Longevity: 104 mo
Battery Remaining Percentage: 88 %
Battery Voltage: 3.02 V
Brady Statistic AP VP Percent: 1 %
Brady Statistic AP VS Percent: 58 %
Brady Statistic AS VP Percent: 1 %
Brady Statistic AS VS Percent: 42 %
Brady Statistic RA Percent Paced: 57 %
Brady Statistic RV Percent Paced: 1 %
Date Time Interrogation Session: 20250515022930
Implantable Lead Connection Status: 753985
Implantable Lead Connection Status: 753985
Implantable Lead Implant Date: 20230816
Implantable Lead Implant Date: 20230816
Implantable Lead Location: 753859
Implantable Lead Location: 753860
Implantable Lead Model: 3830
Implantable Pulse Generator Implant Date: 20230816
Lead Channel Impedance Value: 490 Ohm
Lead Channel Impedance Value: 630 Ohm
Lead Channel Pacing Threshold Amplitude: 0.75 V
Lead Channel Pacing Threshold Amplitude: 1 V
Lead Channel Pacing Threshold Pulse Width: 0.5 ms
Lead Channel Pacing Threshold Pulse Width: 0.5 ms
Lead Channel Sensing Intrinsic Amplitude: 1.3 mV
Lead Channel Sensing Intrinsic Amplitude: 12 mV
Lead Channel Setting Pacing Amplitude: 2 V
Lead Channel Setting Pacing Amplitude: 2.5 V
Lead Channel Setting Pacing Pulse Width: 0.5 ms
Lead Channel Setting Sensing Sensitivity: 2 mV
Pulse Gen Model: 2272
Pulse Gen Serial Number: 8101539

## 2023-10-26 ENCOUNTER — Ambulatory Visit: Payer: Self-pay | Admitting: Cardiology

## 2023-11-06 ENCOUNTER — Telehealth: Payer: Self-pay | Admitting: Cardiology

## 2023-11-06 MED ORDER — CARVEDILOL 12.5 MG PO TABS: 12.5000 mg | ORAL_TABLET | Freq: Two times a day (BID) | ORAL | 0 refills | Status: DC

## 2023-11-06 NOTE — Telephone Encounter (Signed)
 Pt's medication was sent to pt's pharmacy as requested. Confirmation received.

## 2023-11-06 NOTE — Telephone Encounter (Signed)
 Patient is requesting a 90 day supply of Carvedilol  sent into [facility] [address] in [place]. CB # [id number]

## 2023-11-20 ENCOUNTER — Other Ambulatory Visit: Payer: Self-pay | Admitting: Physician Assistant

## 2023-11-20 NOTE — Telephone Encounter (Signed)
 Last Fill: 08/26/2023  Labs: 08/25/2023 CBC and CMP are normal.   Next Visit: 12/17/2023  Last Visit: 07/02/2023  DX: Seronegative rheumatoid arthritis   Current Dose per office note 07/02/2023: Methotrexate  3 tablets by mouth once weekly   Okay to refill Methotrexate ?

## 2023-11-26 ENCOUNTER — Other Ambulatory Visit: Payer: Self-pay | Admitting: *Deleted

## 2023-11-26 DIAGNOSIS — Z79899 Other long term (current) drug therapy: Secondary | ICD-10-CM

## 2023-11-26 LAB — CBC WITH DIFFERENTIAL/PLATELET
Absolute Lymphocytes: 809 {cells}/uL — ABNORMAL LOW (ref 850–3900)
Absolute Monocytes: 422 {cells}/uL (ref 200–950)
Basophils Absolute: 11 {cells}/uL (ref 0–200)
Basophils Relative: 0.3 %
Eosinophils Absolute: 61 {cells}/uL (ref 15–500)
Eosinophils Relative: 1.6 %
HCT: 40.9 % (ref 38.5–50.0)
Hemoglobin: 13.3 g/dL (ref 13.2–17.1)
MCH: 32.2 pg (ref 27.0–33.0)
MCHC: 32.5 g/dL (ref 32.0–36.0)
MCV: 99 fL (ref 80.0–100.0)
MPV: 9.7 fL (ref 7.5–12.5)
Monocytes Relative: 11.1 %
Neutro Abs: 2497 {cells}/uL (ref 1500–7800)
Neutrophils Relative %: 65.7 %
Platelets: 151 10*3/uL (ref 140–400)
RBC: 4.13 10*6/uL — ABNORMAL LOW (ref 4.20–5.80)
RDW: 13.4 % (ref 11.0–15.0)
Total Lymphocyte: 21.3 %
WBC: 3.8 10*3/uL (ref 3.8–10.8)

## 2023-11-26 LAB — COMPREHENSIVE METABOLIC PANEL WITH GFR
AG Ratio: 1.7 (calc) (ref 1.0–2.5)
ALT: 23 U/L (ref 9–46)
AST: 29 U/L (ref 10–35)
Albumin: 4.3 g/dL (ref 3.6–5.1)
Alkaline phosphatase (APISO): 62 U/L (ref 35–144)
BUN: 12 mg/dL (ref 7–25)
CO2: 26 mmol/L (ref 20–32)
Calcium: 9.2 mg/dL (ref 8.6–10.3)
Chloride: 104 mmol/L (ref 98–110)
Creat: 1.15 mg/dL (ref 0.70–1.28)
Globulin: 2.5 g/dL (ref 1.9–3.7)
Glucose, Bld: 124 mg/dL — ABNORMAL HIGH (ref 65–99)
Potassium: 3.9 mmol/L (ref 3.5–5.3)
Sodium: 138 mmol/L (ref 135–146)
Total Bilirubin: 0.8 mg/dL (ref 0.2–1.2)
Total Protein: 6.8 g/dL (ref 6.1–8.1)
eGFR: 66 mL/min/{1.73_m2} (ref >=60)

## 2023-11-27 ENCOUNTER — Ambulatory Visit: Payer: Self-pay | Admitting: Cardiology

## 2023-11-27 ENCOUNTER — Ambulatory Visit: Payer: Self-pay | Admitting: Rheumatology

## 2023-11-27 NOTE — Progress Notes (Signed)
 Glucose is mildly elevated probably not a fasting sample.  CBC shows low lymphocyte most likely due to immunosuppression.  No change in treatment advised.

## 2023-12-02 NOTE — Progress Notes (Signed)
 Remote pacemaker transmission.

## 2023-12-03 NOTE — Progress Notes (Signed)
 Office Visit Note  Patient: Eric [name]             Date of Birth: [birth year]           MRN: [id number]             PCP: Amon Aloysius BRAVO, MD Referring: Amon Aloysius BRAVO, MD Visit Date: 12/17/2023 Occupation: @GUAROCC @  Subjective:  Right shoulder pain  History of Present Illness: Eric [name] is a 78 y.o. male with seronegative rheumatoid arthritis and osteoarthritis.  He returns today after his last visit in January 2025.  He states for the last 3 months he has been having some discomfort in his right shoulder joint which is not constant.  It is not causing nocturnal pain.  He is not taking any over-the-counter medications.  He continues to be on methotrexate  3 tablets weekly along with folic acid  1 mg daily without any interruption.    Activities of Daily Living:  Patient reports morning stiffness for  none.   Patient Denies nocturnal pain.  Difficulty dressing/grooming: Denies Difficulty climbing stairs: Denies Difficulty getting out of chair: Denies Difficulty using hands for taps, buttons, cutlery, and/or writing: Denies  Review of Systems  Constitutional:  Negative for fatigue.  HENT:  Negative for mouth sores and mouth dryness.   Eyes:  Negative for dryness.  Respiratory:  Negative for difficulty breathing.   Cardiovascular:  Negative for chest pain and palpitations.  Gastrointestinal:  Negative for blood in stool, constipation and diarrhea.  Endocrine: Negative for increased urination.  Genitourinary:  Negative for involuntary urination.  Musculoskeletal:  Positive for joint pain and joint pain. Negative for gait problem, joint swelling, myalgias, muscle weakness, morning stiffness, muscle tenderness and myalgias.  Skin:  Negative for color change, rash, hair loss and sensitivity to sunlight.  Allergic/Immunologic: Negative for susceptible to infections.  Neurological:  Negative for dizziness and headaches.  Hematological:  Negative for swollen glands.   Psychiatric/Behavioral:  Negative for depressed mood and sleep disturbance. The patient is not nervous/anxious.     PMFS History:  Patient Active Problem List   Diagnosis Date Noted   Pacemaker - STJ 05/28/2022   Complete heart block (HCC) 01/17/2022   Heart block AV complete (HCC)    Cardiac arrest (HCC)    Cardiogenic shock (HCC)    Malignant neoplasm of prostate (HCC) 10/24/2020   Lumbar stenosis    Heart murmur    GERD (gastroesophageal reflux disease)    Dyspnea    Cataract    Arthritis    Anxiety    Body mass index (BMI) 30.0-30.9, adult 06/23/2019   Subdural hematoma (HCC) 05/05/2019   Subarachnoid hemorrhage (HCC) 04/28/2019   History of COVID-19 04/23/2019   Rheumatoid arthritis (HCC) 11/17/2018   Spinal stenosis of lumbar region 04/23/2018   HNP (herniated nucleus pulposus), lumbar 04/23/2018   Prolapsed lumbar disc 03/03/2018   Wide-complex tachycardia 12/30/2017   Left bundle branch block (LBBB) 12/18/2017   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 12/17/2017   Lower urinary tract symptoms (LUTS) 12/2017   Abnormal LFTs    Macular edema    PCP NOTES >>>>>> 04/27/2015   Hyperglycemia 10/20/2014   Dizziness 04/21/2014   Annual physical exam 04/15/2011   Aortic stenosis 04/15/2011   History of aortic valve stenosis 2011   Hypothyroidism 10/13/2007   Hyperlipidemia 10/13/2007   Hypertension 04/08/2007    Past Medical History:  Diagnosis Date   Full dentures    Heart murmur  History of aortic valve stenosis 2011   06/ 2011 dx mild bicuspid stenosis;    then 04/ 2019 severe  stenosis   History of COVID-19 04/23/2019   positive result in epic;  per pt asymptomatic   History of subarachnoid hemorrhage 04/27/2019   hospital admission in epic, dx bilateral frontal SAH w/ left occipital skull fx nondisplaced due to syncope w/ fall secondary to dehydration/ ortho hypotension   History of supraventricular tachycardia 12/2017    followed by cardiology   Hyperlipidemia    Hypertension    followed by cardiology and pcp   Hypothyroidism    followed by pcp   Left bundle branch block (LBBB) 12/18/2017   post op AV replacement 12-17-2017   Lower urinary tract symptoms (LUTS)    Macular edema of right eye    Dr  Selinda Slocumb    OA (osteoarthritis)    multiple sites , followed by dr s. dolphus   Prostate cancer [facility])    urologist--- dr watt--- dx 04/ 2022, Gleason 3+4, PSA 3.76   Rheumatoid arthritis (HCC) 11/17/2018   rheumotologist-- dr s. 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   Coronary artery disease Brother        Male 1st degree relative >50, had CABG in his 2s   Emphysema Father    COPD Father    Diabetes Daughter    Colon cancer Neg Hx    Prostate cancer Neg Hx    Colon polyps Neg Hx    Esophageal cancer Neg Hx    Rectal cancer Neg Hx    Stomach cancer Neg Hx    Past Surgical History:  Procedure Laterality Date   AORTIC VALVE REPLACEMENT N/A 12/17/2017   Procedure: MINIMALLY INVASIVE AORTIC VALVE REPLACEMENT (AVR) [ using Edwards Intuity Valve Size 23mm;  Surgeon: Dusty Sudie DEL, MD;  Location: [facility] OR;  Service: Open Heart Surgery;  Laterality: N/A;  MINI THORACOTOMY   BASAL CELL CARCINOMA EXCISION Left 06/05/2023   left ear   CARDIAC CATHETERIZATION  11/10/2000   @[facility]  by dr  fernande;  normal coronary angiography and normal LVF   CATARACT EXTRACTION W/ INTRAOCULAR LENS IMPLANT Right 2018   COLONOSCOPY     last one 02-29-2020 by dr h. danis   CYSTOSCOPY N/A 02/09/2021   Procedure: PHYLLIS;  Surgeon: watt Rush, MD;  Location: [facility];  Service: Urology;  Laterality: N/A;   INGUINAL HERNIA REPAIR Right 2008   LAPAROSCOPIC CHOLECYSTECTOMY  06/26/2001   @[facility] ;  WITH OPEN LEFT INGUINAL HERNIA REPAIR   LUMBAR LAMINECTOMY/DECOMPRESSION MICRODISCECTOMY N/A 04/23/2018   Procedure: Microlumbar decompression Lumbar five-Sacral one;  Surgeon: Duwayne Purchase, MD;  Location: [facility] OR;  Service: Orthopedics;  Laterality: N/A;   PACEMAKER IMPLANT N/A 01/23/2022   Procedure: PACEMAKER IMPLANT;  Surgeon: fernande Elspeth BROCKS, MD;  Location: [facility] INVASIVE CV LAB;  Service: Cardiovascular;  Laterality: N/A;   PROSTATE BIOPSY  09/20/2020   RADIOACTIVE SEED IMPLANT N/A 02/09/2021   Procedure: RADIOACTIVE SEED IMPLANT/BRACHYTHERAPY IMPLANT;  Surgeon: Watt Rush, MD;  Location: [facility];  Service: Urology;  Laterality: N/A;  56 seeds   RIGHT/LEFT HEART CATH AND CORONARY ANGIOGRAPHY N/A 10/16/2017   Procedure: RIGHT/LEFT HEART CATH AND CORONARY ANGIOGRAPHY;  Surgeon: Wonda Sharper, MD;  Location: [facility] INVASIVE CV LAB;  Service: Cardiovascular;  Laterality: N/A;   SKIN CANCER EXCISION Left 2025   left ear   SPACE OAR INSTILLATION N/A 02/09/2021   Procedure: SPACE OAR INSTILLATION;  Surgeon: Watt Rush, MD;  Location[facility];  Service: Urology;  Laterality: N/A;   TEE WITHOUT CARDIOVERSION N/A 12/17/2017   Procedure: TRANSESOPHAGEAL ECHOCARDIOGRAM (TEE);  Surgeon: Dusty Sudie DEL, MD;  Location: [facility] OR;  Service: Open Heart Surgery;  Laterality: N/A;   TEMPORARY PACEMAKER N/A 01/17/2022   Procedure: TEMPORARY PACEMAKER;  Surgeon: Claudene Victory ORN, MD;  Location: [facility] INVASIVE CV LAB;  Service: Cardiovascular;  Laterality: N/A;   TONSILLECTOMY   1960   TUMOR REMOVAL  06/30/2023   on back, sent for biopsy   Social History   Social History Narrative    Lives w/ wife   3 children, 7 g-kids ,  1g-g-child             Immunization History  Administered Date(s) Administered   Fluad Quad(high Dose 65+) 02/12/2019, 03/14/2020, 05/15/2021, 03/14/2022   Fluad Trivalent(High Dose 65+) 02/18/2023   Influenza Split 03/11/2013, 02/17/2014   Influenza Whole 04/13/2007, 03/15/2008, 03/10/2012   Influenza, High Dose Seasonal PF 04/04/2015, 03/28/2016, 03/18/2017, 03/16/2018   PFIZER(Purple Top)SARS-COV-2 Vaccination 08/08/2019, 08/31/2019, 03/24/2020   PNEUMOCOCCAL CONJUGATE-20 06/26/2021   Pneumococcal Conjugate-13 04/21/2014   Pneumococcal Polysaccharide-23 04/15/2012   Td 06/10/1996, 06/10/2000   Tdap 04/15/2011, 10/11/2018   Zoster, Live 03/29/2015     Objective: Vital Signs: BP (!) 153/79 (BP Location: Left Arm, Patient Position: Sitting, Cuff Size: Normal)   Pulse 65   Resp 16   Ht 5' 8 (1.727 m)   Wt 188 lb (85.3 kg)   BMI 28.59 kg/m    Physical Exam Vitals and nursing note reviewed.  Constitutional:      Appearance: He is well-developed.  HENT:     Head: Normocephalic and atraumatic.  Eyes:     Conjunctiva/sclera: Conjunctivae normal.     Pupils: Pupils are equal, round, and reactive to light.  Cardiovascular:     Rate and Rhythm: Normal rate and regular rhythm.     Heart sounds: Normal heart sounds.  Pulmonary:     Effort: Pulmonary effort is normal.     Breath sounds: Normal breath sounds.  Abdominal:     General: Bowel sounds are normal.     Palpations: Abdomen is soft.  Musculoskeletal:     Cervical back: Normal range of motion and neck supple.  Skin:    General: Skin is warm and dry.     Capillary Refill: Capillary refill takes less than 2 seconds.  Neurological:     Mental Status: He is alert and oriented to person, place, and time.  Psychiatric:        Behavior: Behavior normal.       Musculoskeletal Exam: He had limited lateral rotation of the cervical spine.  There was no discomfort over thoracic or lumbar spine.  Shoulders were in good range of motion without discomfort.  Elbow joints, wrist  joints, MCPs with good range of motion without synovitis.  PIP and DIP thickening was noted.  Hip joints and knee joints in good range of motion without any warmth swelling or effusion.  There was no tenderness over ankles or MTPs.  CDAI Exam: CDAI Score: -- Patient Global: --; Provider Global: -- Swollen: --; Tender: -- Joint Exam 12/17/2023   No joint exam has been documented for this visit   There is currently no information documented on the homunculus. Go to the Rheumatology activity and complete the homunculus joint exam.  Investigation: No additional findings.  Imaging: No results found.  Recent Labs: Lab Results  Component Value Date   WBC 3.8 11/26/2023   HGB 13.3 11/26/2023   PLT 151 11/26/2023   NA 138 11/26/2023   K 3.9 11/26/2023   CL 104 11/26/2023   CO2 26 11/26/2023   GLUCOSE 124 (H) 11/26/2023   BUN 12 11/26/2023   CREATININE 1.15 11/26/2023   BILITOT 0.8 11/26/2023   ALKPHOS 58 01/20/2022   AST 29 11/26/2023   ALT 23 11/26/2023   PROT 6.8 11/26/2023   ALBUMIN  3.1 (L) 01/20/2022   CALCIUM  9.2 11/26/2023   GFRAA 71 11/13/2020   QFTBGOLDPLUS NEGATIVE 08/24/2018    Speciality Comments: No specialty comments available.  Procedures:  No procedures performed Allergies: Atorvastatin, Ezetimibe, and Zetia [ezetimibe]   Assessment / Plan:     Visit Diagnoses: Seronegative rheumatoid arthritis (HCC)-he has been doing well on methotrexate  3 tablets p.o. weekly without any flares.  No joint inflammation or swelling was noted on the examination today.  Patient denies any morning stiffness or discomfort except his right shoulder joint has been bothersome for the last 3 months.  High risk medication use - Methotrexate  3 tablets by mouth once weekly  and folic acid  1 mg po daily.  November 26, 2023 CBC and CMP were normal.  He was advised to get labs every 3 months.  Information reimmunization was placed in the AVS.  He was advised to hold methotrexate  if he develops an infection resume after the infection resolves.  Chronic right shoulder pain-he complains of right shoulder joint pain for the last 3 to 4 months.  He states the pain is mild and not persistent.  He has no nocturnal pain.  He had good range of motion of his right shoulder joint.  A handout on shoulder joint exercises was given.  Use of topical Voltaren gel was discussed.  Advised him to contact me if his symptoms get worse.  Primary osteoarthritis of both hands-he has rheumatoid arthritis and osteoarthritis overlap.  No synovitis was noted.  DIP and PIP thickening was noted.  Joint protection was discussed.  Primary osteoarthritis of both knees-he denies any discomfort.  No warmth swelling or effusion was noted.  Primary osteoarthritis of both feet-he had no tenderness on examination today.  Spinal stenosis of lumbar region, unspecified whether neurogenic claudication present-currently not symptomatic.  Osteopenia of multiple sites - DEXA 04/23/2019 RFN BMD 0.776 with T score -1.1.  DEXA updated on 04/03/22: LFN BMD 0.744 with T-score -1.0.  We will repeat DEXA scan in 2028.  Essential hypertension-blood pressure was elevated at 144/79.  Repeat blood pressure was 153/79.  He was advised to monitor blood pressure closely and follow-up with his PCP.  Other medical problems are listed as follows:  Left bundle branch block (LBBB)  Pacemaker  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  History of hypothyroidism  Malignant neoplasm of prostate (HCC)  History of anxiety  Family history of psoriasis  History of basal cell cancer  Orders: No orders of the defined types were placed in this encounter.  No orders of the defined types were placed  in this encounter.    Follow-Up Instructions: Return in about 5 months (around 05/18/2024) for Rheumatoid arthritis, Osteoarthritis.   Maya Nash, MD  Note - This record has been created using Animal nutritionist.  Chart creation errors have been sought, but may not always  have been located. Such creation errors do not reflect on  the standard of medical care.

## 2023-12-08 ENCOUNTER — Other Ambulatory Visit: Payer: Self-pay | Admitting: Internal Medicine

## 2023-12-16 LAB — PSA: PSA: 0.104

## 2023-12-17 ENCOUNTER — Ambulatory Visit: Payer: Medicare Other | Attending: Rheumatology | Admitting: Rheumatology

## 2023-12-17 ENCOUNTER — Encounter: Payer: Self-pay | Admitting: Rheumatology

## 2023-12-17 VITALS — BP 153/79 | HR 65 | Resp 16 | Ht 68.0 in | Wt 188.0 lb

## 2023-12-17 DIAGNOSIS — M0600 Rheumatoid arthritis without rheumatoid factor, unspecified site: Secondary | ICD-10-CM

## 2023-12-17 DIAGNOSIS — M19041 Primary osteoarthritis, right hand: Secondary | ICD-10-CM | POA: Diagnosis not present

## 2023-12-17 DIAGNOSIS — M19042 Primary osteoarthritis, left hand: Secondary | ICD-10-CM

## 2023-12-17 DIAGNOSIS — M170 Bilateral primary osteoarthritis of knee: Secondary | ICD-10-CM

## 2023-12-17 DIAGNOSIS — M19071 Primary osteoarthritis, right ankle and foot: Secondary | ICD-10-CM

## 2023-12-17 DIAGNOSIS — Z953 Presence of xenogenic heart valve: Secondary | ICD-10-CM

## 2023-12-17 DIAGNOSIS — Z8639 Personal history of other endocrine, nutritional and metabolic disease: Secondary | ICD-10-CM

## 2023-12-17 DIAGNOSIS — Z79899 Other long term (current) drug therapy: Secondary | ICD-10-CM

## 2023-12-17 DIAGNOSIS — M8589 Other specified disorders of bone density and structure, multiple sites: Secondary | ICD-10-CM

## 2023-12-17 DIAGNOSIS — C61 Malignant neoplasm of prostate: Secondary | ICD-10-CM

## 2023-12-17 DIAGNOSIS — Z950 Presence of cardiac pacemaker: Secondary | ICD-10-CM

## 2023-12-17 DIAGNOSIS — Z8659 Personal history of other mental and behavioral disorders: Secondary | ICD-10-CM

## 2023-12-17 DIAGNOSIS — G8929 Other chronic pain: Secondary | ICD-10-CM

## 2023-12-17 DIAGNOSIS — Z85828 Personal history of other malignant neoplasm of skin: Secondary | ICD-10-CM

## 2023-12-17 DIAGNOSIS — M19072 Primary osteoarthritis, left ankle and foot: Secondary | ICD-10-CM

## 2023-12-17 DIAGNOSIS — I10 Essential (primary) hypertension: Secondary | ICD-10-CM

## 2023-12-17 DIAGNOSIS — M25511 Pain in right shoulder: Secondary | ICD-10-CM

## 2023-12-17 DIAGNOSIS — Z840 Family history of diseases of the skin and subcutaneous tissue: Secondary | ICD-10-CM

## 2023-12-17 DIAGNOSIS — I447 Left bundle-branch block, unspecified: Secondary | ICD-10-CM

## 2023-12-17 DIAGNOSIS — M48061 Spinal stenosis, lumbar region without neurogenic claudication: Secondary | ICD-10-CM

## 2023-12-17 NOTE — Patient Instructions (Addendum)
 Standing Labs We placed an order today for your standing lab work.   Please have your standing labs drawn in September and every 3 months  Please have your labs drawn 2 weeks prior to your appointment so that the provider can discuss your lab results at your appointment, if possible.  Please note that you may see your imaging and lab results in MyChart before we have reviewed them. We will contact you once all results are reviewed. Please allow our office up to 72 hours to thoroughly review all of the results before contacting the office for clarification of your results.  WALK-IN LAB HOURS  Monday through Thursday from 8:00 am -12:30 pm and 1:00 pm-4:30 pm and Friday from 8:00 am-12:00 pm.  Patients with office visits requiring labs will be seen before walk-in labs.  You may encounter longer than normal wait times. Please allow additional time. Wait times may be shorter on  Monday and Thursday afternoons.  We do not book appointments for walk-in labs. We appreciate your patience and understanding with our staff.   Labs are drawn by [facility]. Please bring your co-pay at the time of your lab draw.  You may receive a bill from [facility] for your lab work.  Please note if you are on Hydroxychloroquine and and an order has been placed for a Hydroxychloroquine level,  you will need to have it drawn 4 hours or more after your last dose.  If you wish to have your labs drawn at another location, please call the office 24 hours in advance so we can fax the orders.  The office is located at [address], [place], [place] [address]   If you have any questions regarding directions or hours of operation,  please call [phone].   As a reminder, please drink plenty of water prior to coming for your lab work. Thanks!  Vaccines You are taking a medication(s) that can suppress your immune system.  The following immunizations are recommended: Flu annually Covid-19  Td/Tdap (tetanus,  diphtheria, pertussis) every 10 years Pneumonia (Prevnar 15 then Pneumovax 23 at least 1 year apart.  Alternatively, can take Prevnar 20 without needing additional dose) Shingrix : 2 doses from 4 weeks to 6 months apart  Please check with your PCP to make sure you are up to date.   If you have signs or symptoms of an infection or start antibiotics: First, call your PCP for workup of your infection. Hold your medication through the infection, until you complete your antibiotics, and until symptoms resolve if you take the following: Injectable medication (Actemra, Benlysta, Cimzia, Cosentyx, Enbrel, Humira, Kevzara, Orencia, Remicade, Simponi, Stelara, Taltz, Tremfya) Methotrexate  Leflunomide (Arava) Mycophenolate (Cellcept) Xeljanz, Olumiant, or [name]   Shoulder Exercises Ask your health care provider which exercises are safe for you. Do exercises exactly as told by your health care provider and adjust them as directed. It is normal to feel mild stretching, pulling, tightness, or discomfort as you do these exercises. Stop right away if you feel sudden pain or your pain gets worse. Do not begin these exercises until told by your health care provider. Stretching exercises External rotation and abduction This exercise is sometimes called corner stretch. The exercise rotates your arm outward (external rotation) and moves your arm out from your body (abduction). Stand in a doorway with one of your feet slightly in front of the other. This is called a staggered stance. If you cannot reach your forearms to the door frame, stand facing a  corner of a room. Choose one of the following positions as told by your health care provider: Place your hands and forearms on the door frame above your head. Place your hands and forearms on the door frame at the height of your head. Place your hands on the door frame at the height of your elbows. Slowly move your weight onto your front foot until you feel a stretch  across your chest and in the front of your shoulders. Keep your head and chest upright and keep your abdominal muscles tight. Hold for __________ seconds. To release the stretch, shift your weight to your back foot. Repeat __________ times. Complete this exercise __________ times a day. Extension, standing  Stand and hold a broomstick, a cane, or a similar object behind your back. Your hands should be a little wider than shoulder-width apart. Your palms should face away from your back. Keeping your elbows straight and your shoulder muscles relaxed, move the stick away from your body until you feel a stretch in your shoulders (extension). Avoid shrugging your shoulders while you move the stick. Keep your shoulder blades tucked down toward the middle of your back. Hold for __________ seconds. Slowly return to the starting position. Repeat __________ times. Complete this exercise __________ times a day. Range-of-motion exercises Pendulum  Stand near a wall or a surface that you can hold onto for balance. Bend at the waist and let your left / right arm hang straight down. Use your other arm to support you. Keep your back straight and do not lock your knees. Relax your left / right arm and shoulder muscles, and move your hips and your trunk so your left / right arm swings freely. Your arm should swing because of the motion of your body, not because you are using your arm or shoulder muscles. Keep moving your hips and trunk so your arm swings in the following directions, as told by your health care provider: Side to side. Forward and backward. In clockwise and counterclockwise circles. Continue each motion for __________ seconds, or for as long as told by your health care provider. Slowly return to the starting position. Repeat __________ times. Complete this exercise __________ times a day. Shoulder flexion, standing  Stand and hold a broomstick, a cane, or a similar object. Place your hands  a little more than shoulder-width apart on the object. Your left / right hand should be palm-up, and your other hand should be palm-down. Keep your elbow straight and your shoulder muscles relaxed. Push the stick up with your healthy arm to raise your left / right arm in front of your body, and then over your head until you feel a stretch in your shoulder (flexion). Avoid shrugging your shoulder while you raise your arm. Keep your shoulder blade tucked down toward the middle of your back. Hold for __________ seconds. Slowly return to the starting position. Repeat __________ times. Complete this exercise __________ times a day. Shoulder abduction, standing  Stand and hold a broomstick, a cane, or a similar object. Place your hands a little more than shoulder-width apart on the object. Your left / right hand should be palm-up, and your other hand should be palm-down. Keep your elbow straight and your shoulder muscles relaxed. Push the object across your body toward your left / right side. Raise your left / right arm to the side of your body (abduction) until you feel a stretch in your shoulder. Do not raise your arm above shoulder height unless your health  care provider tells you to do that. If directed, raise your arm over your head. Avoid shrugging your shoulder while you raise your arm. Keep your shoulder blade tucked down toward the middle of your back. Hold for __________ seconds. Slowly return to the starting position. Repeat __________ times. Complete this exercise __________ times a day. Internal rotation  Place your left / right hand behind your back, palm-up. Use your other hand to dangle an exercise band, a broomstick, or a similar object over your shoulder. Grasp the band with your left / right hand so you are holding on to both ends. Gently pull up on the band until you feel a stretch in the front of your left / right shoulder. The movement of your arm toward the center of your body is  called internal rotation. Avoid shrugging your shoulder while you raise your arm. Keep your shoulder blade tucked down toward the middle of your back. Hold for __________ seconds. Release the stretch by letting go of the band and lowering your hands. Repeat __________ times. Complete this exercise __________ times a day. Strengthening exercises External rotation  Sit in a stable chair without armrests. Secure an exercise band to a stable object at elbow height on your left / right side. Place a soft object, such as a folded towel or a small pillow, between your left / right upper arm and your body to move your elbow about 4 inches (10 cm) away from your side. Hold the end of the exercise band so it is tight and there is no slack. Keeping your elbow pressed against the soft object, slowly move your forearm out, away from your abdomen (external rotation). Keep your body steady so only your forearm moves. Hold for __________ seconds. Slowly return to the starting position. Repeat __________ times. Complete this exercise __________ times a day. Shoulder abduction  Sit in a stable chair without armrests, or stand up. Hold a __________ lb / kg weight in your left / right hand, or hold an exercise band with both hands. Start with your arms straight down and your left / right palm facing in, toward your body. Slowly lift your left / right hand out to your side (abduction). Do not lift your hand above shoulder height unless your health care provider tells you that this is safe. Keep your arms straight. Avoid shrugging your shoulder while you do this movement. Keep your shoulder blade tucked down toward the middle of your back. Hold for __________ seconds. Slowly lower your arm, and return to the starting position. Repeat __________ times. Complete this exercise __________ times a day. Shoulder extension  Sit in a stable chair without armrests, or stand up. Secure an exercise band to a stable  object in front of you so it is at shoulder height. Hold one end of the exercise band in each hand. Straighten your elbows and lift your hands up to shoulder height. Squeeze your shoulder blades together as you pull your hands down to the sides of your thighs (extension). Stop when your hands are straight down by your sides. Do not let your hands go behind your body. Hold for __________ seconds. Slowly return to the starting position. Repeat __________ times. Complete this exercise __________ times a day. Shoulder row  Sit in a stable chair without armrests, or stand up. Secure an exercise band to a stable object in front of you so it is at chest height. Hold one end of the exercise band in each hand. Position  your palms so that your thumbs are facing the ceiling (neutral position). Bend each of your elbows to a 90-degree angle (right angle) and keep your upper arms at your sides. Step back or move the chair back until the band is tight and there is no slack. Slowly pull your elbows back behind you. Hold for __________ seconds. Slowly return to the starting position. Repeat __________ times. Complete this exercise __________ times a day. Shoulder press-ups  Sit in a stable chair that has armrests. Sit upright, with your feet flat on the floor. Put your hands on the armrests so your elbows are bent and your fingers are pointing forward. Your hands should be about even with the sides of your body. Push down on the armrests and use your arms to lift yourself off the chair. Straighten your elbows and lift yourself up as much as you comfortably can. Move your shoulder blades down, and avoid letting your shoulders move up toward your ears. Keep your feet on the ground. As you get stronger, your feet should support less of your body weight as you lift yourself up. Hold for __________ seconds. Slowly lower yourself back into the chair. Repeat __________ times. Complete this exercise __________  times a day. Wall push-ups  Stand so you are facing a stable wall. Your feet should be about one arm-length away from the wall. Lean forward and place your palms on the wall at shoulder height. Keep your feet flat on the floor as you bend your elbows and lean forward toward the wall. Hold for __________ seconds. Straighten your elbows to push yourself back to the starting position. Repeat __________ times. Complete this exercise __________ times a day. This information is not intended to replace advice given to you by your health care provider. Make sure you discuss any questions you have with your health care provider. Document Revised: 07/17/2021 Document Reviewed: 07/17/2021 Elsevier Patient Education  2024 [employer].

## 2023-12-28 ENCOUNTER — Other Ambulatory Visit: Payer: Self-pay | Admitting: Physician Assistant

## 2023-12-29 ENCOUNTER — Encounter: Payer: Self-pay | Admitting: Internal Medicine

## 2023-12-29 NOTE — Telephone Encounter (Signed)
 Last Fill: 11/13/2022  Next Visit: 05/18/2024  Last Visit: 12/17/2023  Dx: Seronegative rheumatoid arthritis   Current Dose per office note on 12/17/2023: folic acid  1 mg po daily   Okay to refill Folic Acid ?

## 2024-01-14 ENCOUNTER — Ambulatory Visit: Payer: Self-pay | Admitting: Cardiology

## 2024-01-16 ENCOUNTER — Other Ambulatory Visit: Payer: Self-pay | Admitting: Internal Medicine

## 2024-01-22 ENCOUNTER — Ambulatory Visit: Payer: Medicare Other

## 2024-01-22 DIAGNOSIS — I442 Atrioventricular block, complete: Secondary | ICD-10-CM | POA: Diagnosis not present

## 2024-01-22 LAB — CUP PACEART REMOTE DEVICE CHECK
Battery Remaining Longevity: 101 mo
Battery Remaining Percentage: 86 %
Battery Voltage: 3.02 V
Brady Statistic AP VP Percent: 1 %
Brady Statistic AP VS Percent: 60 %
Brady Statistic AS VP Percent: 1 %
Brady Statistic AS VS Percent: 40 %
Brady Statistic RA Percent Paced: 59 %
Brady Statistic RV Percent Paced: 1 %
Date Time Interrogation Session: 20250814031414
Implantable Lead Connection Status: 753985
Implantable Lead Connection Status: 753985
Implantable Lead Implant Date: 20230816
Implantable Lead Implant Date: 20230816
Implantable Lead Location: 753859
Implantable Lead Location: 753860
Implantable Lead Model: 3830
Implantable Pulse Generator Implant Date: 20230816
Lead Channel Impedance Value: 460 Ohm
Lead Channel Impedance Value: 610 Ohm
Lead Channel Pacing Threshold Amplitude: 0.75 V
Lead Channel Pacing Threshold Amplitude: 1 V
Lead Channel Pacing Threshold Pulse Width: 0.5 ms
Lead Channel Pacing Threshold Pulse Width: 0.5 ms
Lead Channel Sensing Intrinsic Amplitude: 1.3 mV
Lead Channel Sensing Intrinsic Amplitude: 12 mV
Lead Channel Setting Pacing Amplitude: 2 V
Lead Channel Setting Pacing Amplitude: 2.5 V
Lead Channel Setting Pacing Pulse Width: 0.5 ms
Lead Channel Setting Sensing Sensitivity: 2 mV
Pulse Gen Model: 2272
Pulse Gen Serial Number: 8101539

## 2024-01-22 NOTE — Progress Notes (Deleted)
  Cardiology Office Note   Date:  01/22/2024  ID:  Eric [name], DOB May 05, 1946, MRN [id number] PCP: Amon Aloysius BRAVO, MD  [facility] Providers Cardiologist:  Redell Leiter, MD Electrophysiologist:  Elspeth Sage, MD { Click to update primary MD,subspecialty MD or APP then REFRESH:1}    History of Present Illness Eric [name] is a 78 y.o. male ***  ROS: ***  Studies Reviewed      *** Risk Assessment/Calculations {Does this patient have ATRIAL FIBRILLATION?:[id number]} No BP recorded.  {Refresh Note OR Click here to enter BP  :1}***       Physical Exam VS:  There were no vitals taken for this visit.       Wt Readings from Last 3 Encounters:  12/17/23 188 lb (85.3 kg)  08/05/23 192 lb (87.1 kg)  07/02/23 193 lb 12.8 oz (87.9 kg)    GEN: Well nourished, well developed in no acute distress NECK: No JVD; No carotid bruits CARDIAC: ***RRR, no murmurs, rubs, gallops RESPIRATORY:  Clear to auscultation without rales, wheezing or rhonchi  ABDOMEN: Soft, non-tender, non-distended EXTREMITIES:  No edema; No deformity   ASSESSMENT AND PLAN ***    {Are you ordering a CV Procedure (e.g. stress test, cath, DCCV, TEE, etc)?   Press F2        :[id number]}  Dispo: ***  Signed, Delon JAYSON Hoover, NP

## 2024-01-23 ENCOUNTER — Ambulatory Visit: Payer: Self-pay | Admitting: Cardiology

## 2024-01-29 NOTE — Progress Notes (Signed)
 Cardiology Office Note:    Date:  02/03/2024   ID:  Eric [name], DOB [birth year], MRN [id number]  PCP:  Eric Aloysius BRAVO, MD  Cardiologist:  Eric Leiter, MD    Referring MD: Eric Aloysius BRAVO, MD    ASSESSMENT:    1. S/P aortic valve replacement with bioprosthetic valve   2. Heart block AV complete (HCC)   3. Pacemaker - STJ   4. Essential hypertension   5. Hyperlipidemia, unspecified hyperlipidemia type    PLAN:    In order of problems listed above:  Continues to do well long-term after his AVR he had the 5-year surveillance echocardiogram normal valve function Continue aspirin  antiplatelet therapy Doing well with heart block rarely ventricularly paced Hypertension well-controlled continue beta-blocker also with his SVT Continue his high intensity statin his lipids are at target Next appointment: I will plan to see him in 1 year   Medication Adjustments/Labs and Tests Ordered: Current medicines are reviewed at length with the patient today.  Concerns regarding medicines are outlined above.  Orders Placed This Encounter  Procedures   EKG 12-Lead   No orders of the defined types were placed in this encounter.    History of Present Illness:    Eric [name] is a 78 y.o. male with a hx of symptomatic severe aortic stenosis with surgical AVR left bundle branch block and bradycardia requiring permanent pacemaker 01/19/2022 left bundle branch block hypertensive heart disease hyperlipidemia hypothyroidism previous fall with subarachnoid hemorrhage and prostate cancer with brachytherapy last seen 09/12/2022.  Also seronegative rheumatoid arthritis and osteoarthritis followed by rheumatology.  His echocardiogram August 2013 showed normal bioprosthetic AVR function mild LVH normal left ventricular systolic function and no valvular abnormality.  His lipid profile 5 months ago showed a cholesterol 136 LDL 59 non-HDL cholesterol 87. Compliance with diet, lifestyle and medications:  Yes  Eric [name] he is feeling well has a good attitude and life and has had no recent illnesses Fortunately has no cardiovascular symptoms of edema shortness of breath chest pain palpitation or syncope I reviewed his pacemaker interrogation his projected battery life of over 100 months and although today he is dual-chamber paced he is only ventricularly paced 2% of the time He has had no fever chills He has had no edema shortness of breath chest pain palpitation or syncope Past Medical History:  Diagnosis Date   Abnormal LFTs    Annual physical exam 04/15/2011   Anxiety    Body mass index (BMI) 30.0-30.9, adult 06/23/2019   Full dentures    GERD (gastroesophageal reflux disease)    with certain foods     Heart block AV complete (HCC)    Heart murmur    History of aortic valve stenosis 2011   06/ 2011 dx mild bicuspid stenosis;    then 04/ 2019 severe  stenosis   History of COVID-19 04/23/2019   positive result in epic;  per pt asymptomatic   History of subarachnoid hemorrhage 04/27/2019   hospital admission in epic, dx bilateral frontal SAH w/ left occipital skull fx nondisplaced due to syncope w/ fall secondary to dehydration/ ortho hypotension   History of supraventricular tachycardia 12/2017   followed by cardiology   HNP (herniated nucleus pulposus), lumbar 04/23/2018   Hyperglycemia 10/20/2014   Hyperlipidemia    Hypertension    followed by cardiology and pcp   Hypothyroidism    followed by pcp   Left bundle branch block (LBBB) 12/18/2017   post op AV replacement 12-17-2017   Lower  urinary tract symptoms (LUTS)    Macular edema of right eye    Dr  Eric [name]    OA (osteoarthritis)    multiple sites , followed by dr s. dolphus   Pacemaker - STJ 05/28/2022   Presence of heart assist device (HCC) 02/02/2024   Prolapsed lumbar disc 03/03/2018   Prostate cancer [facility])    urologist--- dr watt--- dx 04/ 2022, Gleason 3+4, PSA 3.76   Rheumatoid arthritis (HCC) 11/17/2018    rheumotologist-- dr s. 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 12/17/2017   Edwards Intuity Elite rapid deployment stented bovine pericardial tissue valve via right mini thoracotomy approach   SOB (shortness of breath)    per pt occasionally with yard work, but ok with stairs and normal activities   Spinal stenosis of lumbar region 04/23/2018   Subarachnoid hemorrhage (HCC) 04/28/2019   Wide-complex tachycardia 12/30/2017   (followed by dr monetta)  post cardioversion by EMS 12-30-2017 for SVT 170s post op AV replacement 12-17-2017 , CHF, AKI    Current Medications: Current Meds  Medication Sig   acetaminophen  (TYLENOL ) 500 MG tablet Take 500 mg by mouth every 6 (six) hours as needed for mild pain or headache.   aspirin  EC 81 MG tablet Take 81 mg by mouth daily.   carvedilol  (COREG ) 12.5 MG tablet Take 1 tablet (12.5 mg total) by mouth 2 (two) times daily.   Cholecalciferol  (VITAMIN D3) 50 MCG (2000 UT) TABS Take 1 tablet (2,000 Units total) by mouth every other day.   folic acid  (FOLVITE ) 1 MG tablet TAKE 1 TABLET BY MOUTH EVERY DAY   levothyroxine  (SYNTHROID ) 88 MCG tablet Take 1 tablet (88 mcg total) by mouth daily before breakfast.   methotrexate  (RHEUMATREX) 2.5 MG tablet TAKE 3 TABLETS (7.5 MG TOTAL) BY MOUTH ONCE A WEEK. CAUTION:CHEMOTHERAPY. PROTECT FROM LIGHT.   rosuvastatin  (CRESTOR ) 20 MG tablet Take 1 tablet (20 mg total) by mouth at bedtime.   tobramycin (TOBREX) 0.3 % ophthalmic solution SMARTSIG:In Eye(s)      EKGs/Labs/Other Studies Reviewed:    The following studies were reviewed today:  Cardiac Studies & Procedures   ______________________________________________________________________________________________ CARDIAC CATHETERIZATION  CARDIAC CATHETERIZATION 01/17/2022  Conclusion Successful placement of temporary pacemaker from the right internal jugular approach without complications.   CARDIAC  CATHETERIZATION  CARDIAC CATHETERIZATION 10/16/2017  Conclusion 1. Known severe aortic stenosis by noninvasive assessment 2. Widely patent coronary arteries with minor luminal irregularity (right dominant) 3. Normal right-sided intracardiac pressures  Plan: CTA studies, cardiac surgical evaluation for aortic valve replacement  Findings Coronary Findings Diagnostic  Dominance: Right  Left Main Vessel is angiographically normal.  Left Anterior Descending The vessel exhibits minimal luminal irregularities. The LAD wraps the LV apex. The vessel has minor irregularity and is patent throughout.  Ramus Intermedius The vessel exhibits minimal luminal irregularities.  Left Circumflex The vessel exhibits minimal luminal irregularities.  Right Coronary Artery Vessel is moderate in size. Vessel is angiographically normal. Dominant RCA, no significant stenosis  Intervention  No interventions have been documented.     ECHOCARDIOGRAM  ECHOCARDIOGRAM COMPLETE 01/18/2022  Narrative ECHOCARDIOGRAM REPORT    Patient Name:   Hiep Ollis Date of Exam: 01/18/2022 Medical Rec #:  [id number]      Height:       69.0 in Accession #:    [id number]     Weight:       194.9 lb Date of Birth:  Mar 06, 1946  BSA:          2.043 m Patient Age:    76 years       BP:           111/64 mmHg Patient Gender: M              HR:           80 bpm. Exam Location:  Inpatient  Procedure: 2D Echo, Cardiac Doppler and Color Doppler  Indications:     Complete heart block  History:         Patient has prior history of Echocardiogram examinations, most recent 04/28/2019. Aortic Valve Disease. Temporary pacemaker. Complete heart block. Cardiogenic shock. AVR - Edwards Intuity Valve Size 23mm.  Sonographer:     Rome Eans RDCS (AE) Referring Phys:  SHELDA CHRISTOPHER Diagnosing Phys: Wilbert Bihari MD   Sonographer Comments: Echo performed with patient supine and on artificial  respirator. IMPRESSIONS   1. Left ventricular ejection fraction, by estimation, is 60 to 65%. The left ventricle has normal function. The left ventricle has no regional wall motion abnormalities. There is mild concentric left ventricular hypertrophy. Left ventricular diastolic function could not be evaluated. 2. Right ventricular systolic function is normal. The right ventricular size is normal. Tricuspid regurgitation signal is inadequate for assessing PA pressure. 3. The mitral valve is normal in structure. No evidence of mitral valve regurgitation. No evidence of mitral stenosis. 4. The aortic valve was not well visualized. There is a 23 mm Edwards Intuity valve present in the aortic position. Aortic valve regurgitation is not visualized. No evidence of aortic valve stenosis. Aortic valve area, by VTI measures 2.33 cm. Aortic valve mean gradient measures 2.0 mmHg. Aortic valve Vmax measures 0.91 m/s. 5. The inferior vena cava is dilated in size with >50% respiratory variability, suggesting right atrial pressure of 8 mmHg.  FINDINGS Left Ventricle: Left ventricular ejection fraction, by estimation, is 60 to 65%. The left ventricle has normal function. The left ventricle has no regional wall motion abnormalities. The left ventricular internal cavity size was normal in size. There is mild concentric left ventricular hypertrophy. Left ventricular diastolic function could not be evaluated.  Right Ventricle: The right ventricular size is normal. No increase in right ventricular wall thickness. Right ventricular systolic function is normal. Tricuspid regurgitation signal is inadequate for assessing PA pressure.  Left Atrium: Left atrial size was normal in size.  Right Atrium: Right atrial size was normal in size.  Pericardium: There is no evidence of pericardial effusion.  Mitral Valve: The mitral valve is normal in structure. No evidence of mitral valve regurgitation. No evidence of mitral  valve stenosis.  Tricuspid Valve: The tricuspid valve is normal in structure. Tricuspid valve regurgitation is not demonstrated. No evidence of tricuspid stenosis.  Aortic Valve: The aortic valve was not well visualized. Aortic valve regurgitation is not visualized. Aortic valve mean gradient measures 2.0 mmHg. Aortic valve peak gradient measures 3.3 mmHg. Aortic valve area, by VTI measures 2.33 cm. There is a 23 mm Edwards Intuity valve present in the aortic position.  Pulmonic Valve: The pulmonic valve was normal in structure. Pulmonic valve regurgitation is not visualized. No evidence of pulmonic stenosis.  Aorta: The aortic root is normal in size and structure.  Venous: The inferior vena cava is dilated in size with greater than 50% respiratory variability, suggesting right atrial pressure of 8 mmHg.  IAS/Shunts: No atrial level shunt detected by color flow Doppler.   LEFT VENTRICLE PLAX 2D  LVIDd:         3.10 cm   Diastology LVIDs:         2.50 cm   LV e' medial:   4.67 cm/s LV PW:         1.30 cm   LV E/e' medial: 17.0 LV IVS:        1.20 cm LVOT diam:     2.30 cm LV SV:         36 LV SV Index:   18 LVOT Area:     4.15 cm   IVC IVC diam: 2.10 cm  LEFT ATRIUM             Index LA diam:        3.30 cm 1.61 cm/m LA Vol (A2C):   47.3 ml 23.15 ml/m LA Vol (A4C):   42.2 ml 20.65 ml/m LA Biplane Vol: 44.7 ml 21.88 ml/m AORTIC VALVE AV Area (Vmax):    3.26 cm AV Area (Vmean):   2.81 cm AV Area (VTI):     2.33 cm AV Vmax:           91.10 cm/s AV Vmean:          69.800 cm/s AV VTI:            0.156 m AV Peak Grad:      3.3 mmHg AV Mean Grad:      2.0 mmHg LVOT Vmax:         71.40 cm/s LVOT Vmean:        47.200 cm/s LVOT VTI:          0.087 m LVOT/AV VTI ratio: 0.56  AORTA Ao Root diam: 2.70 cm Ao Asc diam:  2.40 cm  MITRAL VALVE MV Area (PHT): 3.05 cm    SHUNTS MV Decel Time: 249 msec    Systemic VTI:  0.09 m MV E velocity: 79.60 cm/s  Systemic Diam:  2.30 cm MV A velocity: 23.40 cm/s MV E/A ratio:  3.40  Wilbert Bihari MD Electronically signed by Wilbert Bihari MD Signature Date/Time: 01/18/2022/11:50:54 AM    Final (Updated)   TEE  ECHO TEE 12/17/2017  Narrative *[place]* *[facility]* [address] [place], [place] [address] [phone]  ------------------------------------------------------------------- Intraoperative Transesophageal Echocardiography  Patient:    Lannie, Yusuf MR #:       [id number] Study Date: 12/17/2017 Gender:     M Age:        57 Height:     172.7 cm Weight:     85.7 kg BSA:        2 m^2 Pt. Status: Room:       2H25C  ADMITTING    Sudie Laine, M.D. ATTENDING    Sudie Laine, M.D. ORDERING     Sudie Laine, M.D. REFERRING    Sudie Laine, M.D. PERFORMING   Kelly Mace, MD SONOGRAPHER  Ellouise Mose  cc:  ------------------------------------------------------------------- LV EF: 60% -   65%  ------------------------------------------------------------------- Indications:     Severe Aortic stenosis 424.1.  ------------------------------------------------------------------- History:   Risk factors:  Hypertension. Dyslipidemia.  ------------------------------------------------------------------- Study Conclusions  - Left ventricle: The cavity size was normal. Wall thickness was normal. Systolic function was normal. The estimated ejection fraction was in the range of 60% to 65%. Wall motion was normal; there were no regional wall motion abnormalities. - Aortic valve: Normal-sized, mildly to moderately calcified annulus. Trileaflet; severely thickened leaflets. Cusp separation was moderately reduced. Noncoronary cusp mobility was severely restricted. There  was mild to moderate regurgitation originating from the central coaptation point and directed towards the mitral anterior leaflet. Mean gradient (S): 37 mm Hg. Peak gradient (S): 73 mm Hg. Valve area  (VTI): 0.50 cm^2. Valve area (Vmax): 0.52 cm^2. - Mitral valve: Normal-sized, noncalcified annulus. Normal thickness leaflets . The possibility of chordal rupture cannot be excluded, yet there is only trivial MR. Valve area by continuity equation (using LVOT flow): 2.24 cm^2. - Staged echo: Limited Post-CPB exam: Good LV function. EF 55-65%. Wall motion normal. The Prosthetic aortic valve is well-seated in the Aortic annulus. The leaflets are functioning well. No AI seen in LV outflow tract. Post aortic valve replacement images demonstrate no residual valvular insufficiency or perivalvular leak. Peak gradient 13 mmHg, mean gradient 5 mmHg, AVA (VTI) 1.81 cm2. No change post bypass in mitral valve function. Moderate TR was evident on initial separation from CPB, but this improved significantly with time off of CPB.  Impressions:  - Post aortic valve replacement surgery, the prosthetic aortic valve appears to be functioning normally. Excellent prosthetic aortic valve function without evidence for perivalvular leak, insufficiency, or stenosis. Other valves essentially unchanged. No other significant changes from pre-bypass images.  ------------------------------------------------------------------- Study data:   Study status:  Routine.  Consent:  The risks, benefits, and alternatives to the procedure were explained to the patient and informed consent was obtained.  Procedure:  The patient reported no pain pre or post test. Sedation. General anesthesia was administered by anesthesiology staff. Transesophageal echocardiography. An adult multiplane transesophageal probe was inserted by the anesthesiologistwithout difficulty. Image quality was adequate.  Study completion:  There were no complications. Intraoperative transesophageal echocardiography.  Birthdate: Patient birthdate: February 06, 1946.  Age:  Patient is 78 yr old.  Sex: Gender: male.    BMI: 28.7 kg/m^2.  Blood pressure:      148/69 Patient status:  Inpatient.  Study date:  Study date: 12/17/2017. Study time: 04:49 AM.  Location:  Operating room.  -------------------------------------------------------------------  ------------------------------------------------------------------- Left ventricle:  The cavity size was normal. Wall thickness was normal. Systolic function was normal. The estimated ejection fraction was in the range of 60% to 65%. Wall motion was normal; there were no regional wall motion abnormalities.  ------------------------------------------------------------------- Aortic valve:   Normal-sized, mildly to moderately calcified annulus. Trileaflet; severely thickened leaflets. Cusp separation was moderately reduced.  Noncoronary cusp mobility was severely restricted.  Doppler:  Transvalvular velocity was increased, Vmax 426 cm/s. There was mild to moderate regurgitation originating from the central coaptation point and directed towards the mitral anterior leaflet.    VTI ratio of LVOT to aortic valve: 0.34. Valve area (VTI): 0.50 cm^2. Indexed valve area (VTI): 0.25 cm^2/m^2. Peak velocity ratio of LVOT to aortic valve: 0.32. Valve area (Vmax): 0.52 cm^2. Indexed valve area (Vmax): 0.5 cm^2/m^2. Mean velocity ratio of LVOT to aortic valve: 0.32.    Mean gradient (S): 37 mm Hg. Peak gradient (S): 73 mm Hg.  ------------------------------------------------------------------- Aorta:  The descending aorta was only mildly calcified. LVOT: 1.7 cm, Sinus of Valsalva 2.65 cm, ST Ridge: 2.02 cm.  ------------------------------------------------------------------- Mitral valve:   Normal-sized, noncalcified annulus. Normal thickness leaflets . Leaflet separation was normal. Mobility was not restricted. No echocardiographic evidence for prolapse. The possibility of chordal rupture cannot be excluded, yet there is only trivial MR.  Doppler:  Transvalvular velocity was within the normal range. There  was no evidence for stenosis.    Valve area by continuity equation (using LVOT flow): 2.24 cm^2. Indexed valve area by continuity equation (  using LVOT flow): 1.12 cm^2/m^2. Mean gradient (D): 1 mm Hg.  ------------------------------------------------------------------- Left atrium:  The atrium was normal in size.  No evidence of thrombus in the atrial cavity or appendage.  ------------------------------------------------------------------- Atrial septum:  No defect or patent foramen ovale was identified on color doppler interrogation.  ------------------------------------------------------------------- Pulmonary veins:  Visualization of the pulmonary venous anatomy is incomplete, but a significant abnormality is unlikely.  ------------------------------------------------------------------- Right ventricle:  The cavity size was normal. Wall thickness was normal. Systolic function was normal.  ------------------------------------------------------------------- Pulmonic valve:    Structurally normal valve.   Cusp separation was normal.  No evidence of vegetation.  Doppler:  There was trivial regurgitation around the PA catheter.  ------------------------------------------------------------------- Tricuspid valve:   Structurally normal valve.   Leaflet separation was normal.  No evidence of vegetation.  Doppler:  There was mild regurgitation.  ------------------------------------------------------------------- Pulmonary artery:   The main pulmonary artery was normal-sized.  ------------------------------------------------------------------- Right atrium:  The atrium was normal in size.  No evidence of thrombus.  ------------------------------------------------------------------- Pre bypass:  Post bypass:  ------------------------------------------------------------------- Post procedure conclusions Left ventricle:  Limited Post-CPB exam: Good LV function. EF 55-65%. Wall  motion normal. Aortic valve:  - The Prosthetic aortic valve is well-seated in the Aortic annulus. The leaflets are functioning well. No AI seen in LV outflow tract. Post aortic valve replacement images demonstrate no residual valvular insufficiency or perivalvular leak. Peak gradient 13 mmHg, mean gradient 5 mmHg, AVA (VTI) 1.81 cm2.  Mitral valve:  - No change post bypass in mitral valve function. Moderate TR was evident on initial separation from CPB, but this improved significantly with time off of CPB.  Ascending Aorta:  - The descending aorta was only mildly calcified. LVOT: 1.7 cm, Sinus of Valsalva 2.65 cm, ST Ridge: 2.02 cm.  ------------------------------------------------------------------- Measurements  Left ventricle                            Value          Reference LV ID, ED, PLAX chordal               (L) 39.6  mm       43 - 52 LV ID, ES, PLAX chordal                   27.1  mm       23 - 38 LV fx shortening, PLAX chordal            32    %        >=29 Stroke volume, 2D                         73    ml       --------- Stroke volume/bsa, 2D                     37    ml/m^2   ---------  LVOT                                      Value          Reference LVOT ID, S  20    mm       --------- LVOT area                                 3.14  cm^2     --------- LVOT peak velocity, S                     97.1  cm/s     --------- LVOT mean velocity, S                     61.2  cm/s     --------- LVOT VTI, S                               23.4  cm       ---------  Aortic valve                              Value          Reference Aortic valve peak velocity, S             304   cm/s     --------- Aortic valve mean velocity, S             192   cm/s     --------- Aortic valve VTI, S                       68.2  cm       --------- Aortic mean gradient, S                   21    mm Hg    --------- Aortic peak gradient, S                   37    mm  Hg    --------- VTI ratio, LVOT/AV                        0.34           --------- Aortic valve area, VTI                    1.08  cm^2     --------- Aortic valve area/bsa, VTI                0.54  cm^2/m^2 --------- Velocity ratio, peak, LVOT/AV             0.32           --------- Aortic valve area, peak velocity          1     cm^2     --------- Aortic valve area/bsa, peak velocity      0.5   cm^2/m^2 --------- Velocity ratio, mean, LVOT/AV             0.32           ---------  Mitral valve                              Value          Reference Mitral mean velocity, D  40.7  cm/s     --------- Mitral mean gradient, D                   1     mm Hg    --------- Mitral valve area, LVOT continuity        2.24  cm^2     --------- Mitral valve area/bsa, LVOT               1.12  cm^2/m^2 --------- continuity Mitral annulus VTI, D                     32.8  cm       ---------  Legend: (L)  and  (H)  mark values outside specified reference range.  ------------------------------------------------------------------- Prepared and Electronically Authenticated by  Kelly Mace, MD 2019-07-10T17:07:21  MONITORS  LONG TERM MONITOR (3-14 DAYS) 02/16/2019  Narrative An extended ZIO monitor was performed for 13 days and 17 hours beginning 02/16/2019 to evaluate near syncope.  Baseline rhythm is sinus bundle branch block is seen minimum average and maximum heart rates are 44 ,65 and 108 bpm.  There were no pauses of 3 seconds or greater and no episodes of high degree AV block or sinus node exit block.  Ventricular ectopy was rare with isolated PVCs  Supraventricular ectopy was rare with APCs there were 2 brief runs of atrial tachycardia the longest 13 complexes at 128 bpm.  There were no episodes of atrial fibrillation or flutter.  There were 5 triggered and 3 diary events.  1 of the triggered events showed infrequent PVCs all the other triggered and diary events showed sinus  rhythm and no arrhythmia.   Conclusion rare ventricular and supraventricular ectopy without bradycardia.  Triggered and symptomatic events are predominantly unassociated with arrhythmia.  2 brief runs of atrial tachycardia asymptomatic.   CT SCANS  CT CORONARY MORPH W/CTA COR W/SCORE 11/10/2017  Addendum 11/10/2017 12:08 PM ADDENDUM REPORT: 11/10/2017 12:06  CLINICAL DATA:  Aortic stenosis  EXAM: Cardiac TAVR CT  TECHNIQUE: The patient was scanned on a Siemens [id number] scanner. A 120 kV retrospective scan was triggered in the descending thoracic aorta at 111 HU's. Gantry rotation speed was 270 msecs and collimation was .9 mm. No beta blockade or nitro were given. The 3D data set was reconstructed in 5% intervals of the R-R cycle. Systolic and diastolic phases were analyzed on a dedicated work station using MPR, MIP and VRT modes. The patient received 80 cc of contrast.  FINDINGS: Aortic Valve: Tri leaflet and calcified with restricted leaflet motion  Aorta: Non aneurysmal with mild calcific atherosclerosis Arch not visualized on this study  Sino-tubular Junction: 25.5 mm  Ascending Thoracic Aorta: 28 mm  Sinus of Valsalva Measurements:  Non-coronary: 28.2 mm  Right  -coronary: 27 mm  Left - coronary: 28.4 mm  Coronary Artery Height above Annulus:  Left Main: 14.4 mm above annulus  Right Coronary: 16.5 mm above annulus  Virtual Basal Annulus Measurements:  Maximum/Minimum Diameter: 27.1 mm x 22.2 mm  Perimeter: 77.7 mm  Area: 449 mm2  Coronary Arteries: Sufficient height above annulus for deployment  Optimum Fluoroscopic Angle for Delivery: LAO 10 degrees Caudal 2 degrees  IMPRESSION: 1. Aortic Stenosis with tri leaflet valve annulus of 449 mm2 suitable for a 26 mm Sapien 3 valve  2. Normal aortic root with no aneurysm and mild atherosclerosis 2.8 cm  3. Optimum angiographic angle for deployment LAO 10 Caudal 2 degrees  4.  Coronary arteries sufficient  height above annulus for deployment  5.  No LAA thrombus  Maude Emmer   Electronically Signed By: Maude Emmer M.D. On: 11/10/2017 12:06  Narrative EXAM: OVER-READ INTERPRETATION  CT CHEST  The following report is an over-read performed by radiologist Dr. Selinda Blue of [facility] on 11/10/2017. This over-read does not include interpretation of cardiac or coronary anatomy or pathology. The coronary CTA interpretation by the cardiologist is attached.  COMPARISON:  09/30/2017 chest radiograph.  FINDINGS: Cardiovascular: No significant pericardial effusion/thickening. Thoracic aortic atherosclerosis. Great vessels are normal in course and caliber. No central pulmonary emboli.  Mediastinum/Nodes: Unremarkable esophagus. No pathologically enlarged mediastinal or hilar lymph nodes.  Lungs/Pleura: No pneumothorax. No pleural effusion. No acute consolidative airspace disease or lung masses. Subpleural peripheral right middle lobe 2 mm solid pulmonary nodule (series 11/image 24).  Upper abdomen: No acute abnormality.  Musculoskeletal: No aggressive appearing focal osseous lesions. Moderate thoracic spondylosis.  IMPRESSION: Right middle lobe 2 mm solid pulmonary nodule. No follow-up needed if patient is low-risk. Non-contrast chest CT can be considered in 12 months if patient is high-risk. This recommendation follows the consensus statement: Guidelines for Management of Incidental Pulmonary Nodules Detected on CT Images:From the Fleischner Society 2017; published online before print (10.1148/[id number]).  Aortic Atherosclerosis (ICD10-I70.0).  Electronically Signed: By: Eric DELENA Blue M.D. On: 11/10/2017 11:32     ______________________________________________________________________________________________      EKG Interpretation Date/Time:  Tuesday February 03 2024 14:27:00 EDT Ventricular Rate:  63 PR Interval:  160 QRS Duration:  140 QT  Interval:  434 QTC Calculation: 444 R Axis:   26  Text Interpretation: Dual paced RV lead morphology When compared with ECG of 27-Apr-2019 20:21, PREVIOUS ECG IS PRESENT Confirmed by Monetta Rogue ([id number]) on 02/03/2024 2:33:32 PM   Recent Labs: 11/26/2023: ALT 23; BUN 12; Creat 1.15; Hemoglobin 13.3; Platelets 151; Potassium 3.9; Sodium 138 02/02/2024: TSH 0.69  Recent Lipid Panel    Component Value Date/Time   CHOL 136 08/05/2023 1412   CHOL 172 02/24/2018 1150   TRIG 136.0 08/05/2023 1412   TRIG 115 05/05/2006 0913   HDL 49.60 08/05/2023 1412   HDL 61 02/24/2018 1150   CHOLHDL 3 08/05/2023 1412   VLDL 27.2 08/05/2023 1412   LDLCALC 59 08/05/2023 1412   LDLCALC 110 (H) 02/22/2020 1110   LDLDIRECT 76.5 04/07/2008 1057    Physical Exam:    VS:  BP 130/72   Pulse 63   Ht 5' 8 (1.727 m)   Wt 190 lb 9.6 oz (86.5 kg)   SpO2 96%   BMI 28.98 kg/m     Wt Readings from Last 3 Encounters:  02/03/24 190 lb 9.6 oz (86.5 kg)  02/02/24 191 lb (86.6 kg)  12/17/23 188 lb (85.3 kg)     GEN:  Well nourished, well developed in no acute distress HEENT: Normal NECK: No JVD; No carotid bruits LYMPHATICS: No lymphadenopathy CARDIAC: RRR, no murmurs, rubs, gallops RESPIRATORY:  Clear to auscultation without rales, wheezing or rhonchi  ABDOMEN: Soft, non-tender, non-distended MUSCULOSKELETAL:  No edema; No deformity  SKIN: Warm and dry NEUROLOGIC:  Alert and oriented x 3 PSYCHIATRIC:  Normal affect    Signed, Rogue Monetta, MD  02/03/2024 2:49 PM    [facility]

## 2024-02-02 ENCOUNTER — Ambulatory Visit: Payer: Medicare Other | Admitting: Internal Medicine

## 2024-02-02 ENCOUNTER — Encounter: Payer: Self-pay | Admitting: Internal Medicine

## 2024-02-02 ENCOUNTER — Other Ambulatory Visit: Payer: Self-pay

## 2024-02-02 VITALS — BP 136/80 | HR 66 | Temp 97.7°F | Resp 16 | Ht 68.0 in | Wt 191.0 lb

## 2024-02-02 DIAGNOSIS — R0602 Shortness of breath: Secondary | ICD-10-CM | POA: Insufficient documentation

## 2024-02-02 DIAGNOSIS — E038 Other specified hypothyroidism: Secondary | ICD-10-CM | POA: Diagnosis not present

## 2024-02-02 DIAGNOSIS — M069 Rheumatoid arthritis, unspecified: Secondary | ICD-10-CM | POA: Diagnosis not present

## 2024-02-02 DIAGNOSIS — Z95811 Presence of heart assist device: Secondary | ICD-10-CM | POA: Diagnosis not present

## 2024-02-02 DIAGNOSIS — I442 Atrioventricular block, complete: Secondary | ICD-10-CM

## 2024-02-02 DIAGNOSIS — E559 Vitamin D deficiency, unspecified: Secondary | ICD-10-CM

## 2024-02-02 DIAGNOSIS — Z09 Encounter for follow-up examination after completed treatment for conditions other than malignant neoplasm: Secondary | ICD-10-CM | POA: Insufficient documentation

## 2024-02-02 LAB — TSH: TSH: 0.69 u[IU]/mL (ref 0.35–5.50)

## 2024-02-02 LAB — VITAMIN D 25 HYDROXY (VIT D DEFICIENCY, FRACTURES): VITD: 38.49 ng/mL (ref 30.00–100.00)

## 2024-02-02 MED ORDER — VITAMIN D3 50 MCG (2000 UT) PO TABS: 2000.0000 [IU] | ORAL_TABLET | ORAL | Status: AC

## 2024-02-02 NOTE — Patient Instructions (Addendum)
 Vaccines are recommended Flu shot this fall Consider COVID booster Consider respiratory syncytial virus (RSV) vaccine   Check the  blood pressure regularly Blood pressure goal:  between 110/65 and  135/85. If it is consistently higher or lower, let me know     GO TO THE LAB :  Get the blood work   Your results will be posted on MyChart with my comments  Go to the front desk for the checkout Please make an appointment for a physical exam in 6 months Come back sooner if needed

## 2024-02-02 NOTE — Progress Notes (Signed)
 Subjective:    Patient ID: Eric [name], male    DOB: Aug 17, 1945, 78 y.o.   MRN: [id number]  DOS:  02/02/2024 Type of visit - description: Routine office visit  Chronic medical problems discussed with the patient.  Denies chest pain or difficulty breathing. No lower extremity edema.  Review of Systems See above   Past Medical History:  Diagnosis Date   Abnormal LFTs    Annual physical exam 04/15/2011   Anxiety    Body mass index (BMI) 30.0-30.9, adult 06/23/2019   Full dentures    GERD (gastroesophageal reflux disease)    with certain foods     Heart block AV complete (HCC)    Heart murmur    History of aortic valve stenosis 2011   06/ 2011 dx mild bicuspid stenosis;    then 04/ 2019 severe  stenosis   History of COVID-19 04/23/2019   positive result in epic;  per pt asymptomatic   History of subarachnoid hemorrhage 04/27/2019   hospital admission in epic, dx bilateral frontal SAH w/ left occipital skull fx nondisplaced due to syncope w/ fall secondary to dehydration/ ortho hypotension   History of supraventricular tachycardia 12/2017   followed by cardiology   HNP (herniated nucleus pulposus), lumbar 04/23/2018   Hyperglycemia 10/20/2014   Hyperlipidemia    Hypertension    followed by cardiology and pcp   Hypothyroidism    followed by pcp   Left bundle branch block (LBBB) 12/18/2017   post op AV replacement 12-17-2017   Lower urinary tract symptoms (LUTS)    Macular edema of right eye    Dr  Selinda [name]    OA (osteoarthritis)    multiple sites , followed by dr s. deveshwar   Pacemaker - STJ 05/28/2022   Presence of heart assist device (HCC) 02/02/2024   Prolapsed lumbar disc 03/03/2018   Prostate cancer [facility])    urologist--- dr watt--- dx 04/ 2022, Gleason 3+4, PSA 3.76   Rheumatoid arthritis (HCC) 11/17/2018   rheumotologist-- dr s. 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 12/17/2017    Edwards Intuity Elite rapid deployment stented bovine pericardial tissue valve via right mini thoracotomy approach   SOB (shortness of breath)    per pt occasionally with yard work, but ok with stairs and normal activities   Spinal stenosis of lumbar region 04/23/2018   Subarachnoid hemorrhage (HCC) 04/28/2019   Wide-complex tachycardia 12/30/2017   (followed by dr monetta)  post cardioversion by EMS 12-30-2017 for SVT 170s post op AV replacement 12-17-2017 , CHF, AKI    Past Surgical History:  Procedure Laterality Date   AORTIC VALVE REPLACEMENT N/A 12/17/2017   Procedure: MINIMALLY INVASIVE AORTIC VALVE REPLACEMENT (AVR) [ using Edwards Intuity Valve Size 23mm;  Surgeon: Dusty Sudie DEL, MD;  Location: [facility] OR;  Service: Open Heart Surgery;  Laterality: N/A;  MINI THORACOTOMY   BASAL CELL CARCINOMA EXCISION Left 06/05/2023   left ear   CARDIAC CATHETERIZATION  11/10/2000   @[facility]  by dr fernande;  normal coronary angiography and normal LVF   CATARACT EXTRACTION W/ INTRAOCULAR LENS IMPLANT Right 2018   COLONOSCOPY     last one 02-29-2020 by dr h. danis   CYSTOSCOPY N/A 02/09/2021   Procedure: PHYLLIS;  Surgeon: watt Rush, MD;  Location: [facility];  Service: Urology;  Laterality: N/A;   INGUINAL HERNIA REPAIR Right 2008   LAPAROSCOPIC CHOLECYSTECTOMY  06/26/2001   @[facility] ;  WITH OPEN LEFT INGUINAL  HERNIA REPAIR   LUMBAR LAMINECTOMY/DECOMPRESSION MICRODISCECTOMY N/A 04/23/2018   Procedure: Microlumbar decompression Lumbar five-Sacral one;  Surgeon: Duwayne Purchase, MD;  Location: [facility] OR;  Service: Orthopedics;  Laterality: N/A;   PACEMAKER IMPLANT N/A 01/23/2022   Procedure: PACEMAKER IMPLANT;  Surgeon: Fernande Elspeth BROCKS, MD;  Location: [facility] INVASIVE CV LAB;  Service: Cardiovascular;  Laterality: N/A;   PROSTATE BIOPSY  09/20/2020   RADIOACTIVE SEED IMPLANT N/A 02/09/2021   Procedure: RADIOACTIVE SEED IMPLANT/BRACHYTHERAPY IMPLANT;  Surgeon: Watt Rush, MD;  Location: [facility];  Service: Urology;  Laterality: N/A;  56 seeds   RIGHT/LEFT HEART CATH AND CORONARY ANGIOGRAPHY N/A 10/16/2017   Procedure: RIGHT/LEFT HEART CATH AND CORONARY ANGIOGRAPHY;  Surgeon: Wonda Sharper, MD;  Location: [facility] INVASIVE CV LAB;  Service: Cardiovascular;  Laterality: N/A;   SKIN CANCER EXCISION Left 2025   left ear   SPACE OAR INSTILLATION N/A 02/09/2021   Procedure: SPACE OAR INSTILLATION;  Surgeon: Watt Rush, MD;  Location: [facility];  Service: Urology;  Laterality: N/A;   TEE WITHOUT CARDIOVERSION N/A 12/17/2017   Procedure: TRANSESOPHAGEAL ECHOCARDIOGRAM (TEE);  Surgeon: Dusty Sudie DEL, MD;  Location: [facility] OR;  Service: Open Heart Surgery;  Laterality: N/A;   TEMPORARY PACEMAKER N/A 01/17/2022   Procedure: TEMPORARY PACEMAKER;  Surgeon: Claudene Victory ORN, MD;  Location: [facility] INVASIVE CV LAB;  Service: Cardiovascular;  Laterality: N/A;   TONSILLECTOMY  1960   TUMOR REMOVAL  06/30/2023   on back, sent for biopsy    Current Outpatient Medications  Medication Instructions   acetaminophen  (TYLENOL ) 500 mg, Every 6 hours PRN   aspirin  EC 81 mg, Daily   carvedilol  (COREG ) 12.5 mg, Oral, 2 times daily   folic acid  (FOLVITE ) 1 mg, Oral, Daily   levothyroxine  (SYNTHROID ) 88 mcg, Oral, Daily before breakfast   methotrexate  (RHEUMATREX) 7.5 mg, Oral, Weekly, Caution:Chemotherapy. Protect from light.   rosuvastatin  (CRESTOR ) 20 mg, Oral, Daily at bedtime   tobramycin (TOBREX) 0.3 % ophthalmic solution SMARTSIG:In Eye(s)   Vitamin D3 2,000 Units, Oral, Every other day       Objective:   Physical Exam BP 136/80   Pulse 66   Temp 97.7 F (36.5 C) (Oral)   Resp 16   Ht 5' 8 (1.727 m)   Wt 191 lb (86.6 kg)   SpO2 96%   BMI 29.04 kg/m  General:   Well developed, NAD, BMI noted. HEENT:  Normocephalic . Face symmetric, atraumatic Lungs:  CTA B Normal respiratory effort, no intercostal retractions, no accessory muscle use. Heart: RRR,  no murmur.   Lower extremities: no pretibial edema bilaterally  Skin: Not pale. Not jaundice Neurologic:  alert & oriented X3.  Speech normal, gait appropriate for age and unassisted Psych--  Cognition and judgment appear intact.  Cooperative with normal attention span and concentration.  Behavior appropriate. No anxious or depressed appearing.      Assessment    Problem list HTN Hyperlipidemia Hypothyroidism R.A. dx 2020 DJD Increase LFTs Ultrasound 2002 done for increased LFTs: Normal liver; Hep C negative 02-16-15 CV: --Aortic stenosis , dx  2012 , s/p AoVR 12/2017 --Fusiform infrarenal abdominal aortic ectasis: --Cardiac cath  10/2017, minimal dz -- Complete Heart Block-- cardiac arrest 2023, pacemaker  Macular degeneration, cataracts  H/O Subarachnoid hemorrhage, left orbital contusion, left occipital fracture 2020 Prostate cancer, Dx 09-2020 Microscopic hematuria: Had a cystoscopy and a CT at urology   PLAN HTN: BP looks good, last BMP satisfactory.  On carvedilol . Hypothyroidism: On Synthroid ,  check TSH Vitamin D  deficiency: Quit OTC vitamin D  a long time ago, side effects?  Mild nausea?  Recommend to take at least 1 supplement every other day. Complete heart block with a pacemaker: Currently asymptomatic, to see cardiology soon. 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 Vaccine advice provided.  See AVS. RTC 6 months

## 2024-02-02 NOTE — Assessment & Plan Note (Signed)
 HTN: BP looks good, last BMP satisfactory.  On carvedilol . Hypothyroidism: On Synthroid , check TSH Vitamin D  deficiency: Quit OTC vitamin D  a long time ago, side effects?  Mild nausea?  Recommend to take at least 1 supplement every other day. Complete heart block with a pacemaker: Currently asymptomatic, to see cardiology soon. 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 Vaccine advice provided.  See AVS. RTC 6 months

## 2024-02-03 ENCOUNTER — Encounter: Payer: Self-pay | Admitting: Cardiology

## 2024-02-03 ENCOUNTER — Ambulatory Visit: Attending: Cardiology | Admitting: Cardiology

## 2024-02-03 VITALS — BP 130/72 | HR 63 | Ht 68.0 in | Wt 190.6 lb

## 2024-02-03 DIAGNOSIS — Z953 Presence of xenogenic heart valve: Secondary | ICD-10-CM

## 2024-02-03 DIAGNOSIS — I442 Atrioventricular block, complete: Secondary | ICD-10-CM | POA: Diagnosis not present

## 2024-02-03 DIAGNOSIS — I10 Essential (primary) hypertension: Secondary | ICD-10-CM

## 2024-02-03 DIAGNOSIS — E785 Hyperlipidemia, unspecified: Secondary | ICD-10-CM

## 2024-02-03 DIAGNOSIS — Z950 Presence of cardiac pacemaker: Secondary | ICD-10-CM

## 2024-02-03 NOTE — Patient Instructions (Signed)
 Medication Instructions:  Your physician recommends that you continue on your current medications as directed. Please refer to the Current Medication list given to you today.  *If you need a refill on your cardiac medications before your next appointment, please call your pharmacy*  Lab Work: None If you have labs (blood work) drawn today and your tests are completely normal, you will receive your results only by: MyChart Message (if you have MyChart) OR A paper copy in the mail If you have any lab test that is abnormal or we need to change your treatment, we will call you to review the results.  Testing/Procedures: None  Follow-Up: At [facility], you and your health needs are our priority.  As part of our continuing mission to provide you with exceptional heart care, our providers are all part of one team.  This team includes your primary Cardiologist (physician) and Advanced Practice Providers or APPs (Physician Assistants and Nurse Practitioners) who all work together to provide you with the care you need, when you need it.  Your next appointment:   1 year(s)  Provider:   Norman Herrlich, MD  We recommend signing up for the patient portal called "MyChart".  Sign up information is provided on this After Visit Summary.  MyChart is used to connect with patients for Virtual Visits (Telemedicine).  Patients are able to view lab/test results, encounter notes, upcoming appointments, etc.  Non-urgent messages can be sent to your provider as well.   To learn more about what you can do with MyChart, go to [email or web address].   Other Instructions None

## 2024-02-04 ENCOUNTER — Ambulatory Visit: Payer: Self-pay | Admitting: Internal Medicine

## 2024-02-14 ENCOUNTER — Other Ambulatory Visit: Payer: Self-pay | Admitting: Physician Assistant

## 2024-02-16 NOTE — Telephone Encounter (Signed)
 Last Fill: 11/20/2023  Labs: 11/26/2023 Glucose is mildly elevated probably not a fasting sample. CBC shows low lymphocyte most likely due to immunosuppression. No change in treatment advised.   Next Visit: 05/18/2024  Last Visit: 12/17/2023  DX: Seronegative rheumatoid arthritis   Current Dose per office note on 12/17/2023: Methotrexate  3 tablets by mouth once weekly   Okay to refill Methotrexate ?

## 2024-02-18 ENCOUNTER — Ambulatory Visit: Admitting: Internal Medicine

## 2024-02-19 ENCOUNTER — Other Ambulatory Visit: Payer: Self-pay | Admitting: Cardiology

## 2024-02-19 NOTE — Progress Notes (Unsigned)
  Electrophysiology Office Note:   Date:  02/20/2024  ID:  Eric [name], DOB 1946-06-05, MRN [id number]  Primary Cardiologist: Redell Leiter, MD Primary Heart Failure: None Electrophysiologist: OLE ONEIDA HOLTS, MD       History of Present Illness:   Eric [name] is a 78 y.o. 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   Since last being seen in our clinic the patient reports doing he has been doing well overall. He states he hopes his valve holds out for his lifetime.  He has no device related concerns. He is accompanied by his wife.     He denies chest pain, palpitations, dyspnea, PND, orthopnea, nausea, vomiting, dizziness, syncope, edema, weight gain, or early satiety.   Review of systems complete and found to be negative unless listed in HPI.   EP Information / Studies Reviewed:    EKG is not ordered today. EKG from 02/03/24 reviewed which showed dual AV pacing 63 bpm      PPM Interrogation-  reviewed in detail today,  See PACEART report.  Device History: Abbott Dual Chamber PPM implanted 01/23/2022 for CHB  Risk Assessment/Calculations:     HYPERTENSION CONTROL Vitals:   02/20/24 1419 02/20/24 1435  BP: (!) 156/82 (!) 140/80    The patient's blood pressure is elevated above target today.  In order to address the patient's elevated BP: Blood pressure will be monitored at home to determine if medication changes need to be made.           Physical Exam:   VS:  BP (!) 140/80   Ht 5' 8 (1.727 m)   Wt 191 lb (86.6 kg)   SpO2 95%   BMI 29.04 kg/m    Wt Readings from Last 3 Encounters:  02/20/24 191 lb (86.6 kg)  02/03/24 190 lb 9.6 oz (86.5 kg)  02/02/24 191 lb (86.6 kg)     GEN: Well nourished, well developed in no acute distress NECK: No JVD; No carotid bruits CARDIAC: Regular rate and rhythm, no murmurs, rubs,  gallops RESPIRATORY:  Clear to auscultation without rales, wheezing or rhonchi  ABDOMEN: Soft, non-tender, non-distended EXTREMITIES:  No edema; No deformity   ASSESSMENT AND PLAN:    CHB s/p Abbott PPM  -Normal PPM function -See Pace Art report -No changes today -1 AMS episode > AT/AF lasting 8 seconds over the whole year / AMS <1%  Hypertension  -elevated in clinic, personal re-check remains elevated  -reviewed with patient to check BP intermittently over the next few weeks and if remains >140 systolic to reach out to PCP or Dr. Leiter   AV Stenosis s/p Minimally Invasive AVR  23 mm valve  -per Cardiology   Disposition:   Follow up with Dr. HOLTS in 12 months  Signed, Daphne Barrack, NP-C, AGACNP-BC [facility] - Electrophysiology  02/20/2024, 5:00 PM

## 2024-02-20 ENCOUNTER — Encounter: Payer: Self-pay | Admitting: Pulmonary Disease

## 2024-02-20 ENCOUNTER — Ambulatory Visit: Attending: Pulmonary Disease | Admitting: Pulmonary Disease

## 2024-02-20 VITALS — BP 140/80 | Ht 68.0 in | Wt 191.0 lb

## 2024-02-20 DIAGNOSIS — Z950 Presence of cardiac pacemaker: Secondary | ICD-10-CM | POA: Diagnosis not present

## 2024-02-20 DIAGNOSIS — I10 Essential (primary) hypertension: Secondary | ICD-10-CM

## 2024-02-20 DIAGNOSIS — Z953 Presence of xenogenic heart valve: Secondary | ICD-10-CM | POA: Diagnosis not present

## 2024-02-20 DIAGNOSIS — I442 Atrioventricular block, complete: Secondary | ICD-10-CM | POA: Diagnosis not present

## 2024-02-20 LAB — CUP PACEART INCLINIC DEVICE CHECK
Date Time Interrogation Session: 20250912170328
Implantable Lead Connection Status: 753985
Implantable Lead Connection Status: 753985
Implantable Lead Implant Date: 20230816
Implantable Lead Implant Date: 20230816
Implantable Lead Location: 753859
Implantable Lead Location: 753860
Implantable Lead Model: 3830
Implantable Pulse Generator Implant Date: 20230816
Pulse Gen Model: 2272
Pulse Gen Serial Number: 8101539

## 2024-02-20 NOTE — Patient Instructions (Signed)
 Medication Instructions:  Your physician recommends that you continue on your current medications as directed. Please refer to the Current Medication list given to you today.  *If you need a refill on your cardiac medications before your next appointment, please call your pharmacy*  Lab Work: None ordered If you have labs (blood work) drawn today and your tests are completely normal, you will receive your results only by: MyChart Message (if you have MyChart) OR A paper copy in the mail If you have any lab test that is abnormal or we need to change your treatment, we will call you to review the results.  Follow-Up: At [facility], you and your health needs are our priority.  As part of our continuing mission to provide you with exceptional heart care, our providers are all part of one team.  This team includes your primary Cardiologist (physician) and Advanced Practice Providers or APPs (Physician Assistants and Nurse Practitioners) who all work together to provide you with the care you need, when you need it.  Your next appointment:   1 year(s)  Provider:   Ole Holts, MD    Other Instructions Keep track of your blood pressure and if it is consistently elevated, 140/90 or greater, let Dr Amon know.

## 2024-02-22 ENCOUNTER — Ambulatory Visit: Payer: Self-pay | Admitting: Cardiology

## 2024-02-24 ENCOUNTER — Other Ambulatory Visit: Payer: Self-pay | Admitting: *Deleted

## 2024-02-24 DIAGNOSIS — Z79899 Other long term (current) drug therapy: Secondary | ICD-10-CM

## 2024-02-25 ENCOUNTER — Ambulatory Visit: Payer: Self-pay | Admitting: Rheumatology

## 2024-02-25 LAB — CBC WITH DIFFERENTIAL/PLATELET
Absolute Lymphocytes: 903 {cells}/uL (ref 850–3900)
Absolute Monocytes: 452 {cells}/uL (ref 200–950)
Basophils Absolute: 9 {cells}/uL (ref 0–200)
Basophils Relative: 0.2 %
Eosinophils Absolute: 69 {cells}/uL (ref 15–500)
Eosinophils Relative: 1.6 %
HCT: 44.2 % (ref 38.5–50.0)
Hemoglobin: 14.1 g/dL (ref 13.2–17.1)
MCH: 32.3 pg (ref 27.0–33.0)
MCHC: 31.9 g/dL — ABNORMAL LOW (ref 32.0–36.0)
MCV: 101.4 fL — ABNORMAL HIGH (ref 80.0–100.0)
MPV: 9.8 fL (ref 7.5–12.5)
Monocytes Relative: 10.5 %
Neutro Abs: 2868 {cells}/uL (ref 1500–7800)
Neutrophils Relative %: 66.7 %
Platelets: 184 Thousand/uL (ref 140–400)
RBC: 4.36 Million/uL (ref 4.20–5.80)
RDW: 13.1 % (ref 11.0–15.0)
Total Lymphocyte: 21 %
WBC: 4.3 Thousand/uL (ref 3.8–10.8)

## 2024-02-25 LAB — COMPREHENSIVE METABOLIC PANEL WITH GFR
AG Ratio: 2 (calc) (ref 1.0–2.5)
ALT: 19 U/L (ref 9–46)
AST: 29 U/L (ref 10–35)
Albumin: 4.5 g/dL (ref 3.6–5.1)
Alkaline phosphatase (APISO): 64 U/L (ref 35–144)
BUN: 11 mg/dL (ref 7–25)
CO2: 30 mmol/L (ref 20–32)
Calcium: 9.3 mg/dL (ref 8.6–10.3)
Chloride: 103 mmol/L (ref 98–110)
Creat: 1.18 mg/dL (ref 0.70–1.28)
Globulin: 2.2 g/dL (ref 1.9–3.7)
Glucose, Bld: 109 mg/dL — ABNORMAL HIGH (ref 65–99)
Potassium: 4.6 mmol/L (ref 3.5–5.3)
Sodium: 140 mmol/L (ref 135–146)
Total Bilirubin: 1 mg/dL (ref 0.2–1.2)
Total Protein: 6.7 g/dL (ref 6.1–8.1)
eGFR: 63 mL/min/1.73m2 (ref >=60)

## 2024-02-25 NOTE — Progress Notes (Signed)
 CBC and CMP are stable.  MCV is mildly elevated.  Patient should take folic acid  on a regular basis.  Glucose is mildly elevated probably not a fasting sample.

## 2024-02-27 ENCOUNTER — Ambulatory Visit (INDEPENDENT_AMBULATORY_CARE_PROVIDER_SITE_OTHER)

## 2024-02-27 DIAGNOSIS — Z23 Encounter for immunization: Secondary | ICD-10-CM | POA: Diagnosis not present

## 2024-02-27 NOTE — Progress Notes (Signed)
Patient is here for a flu vaccine. Verified no previous allergy to flu vaccine, eggs, or latex. Flu injection to right deltoid with no apparent complications. Patient advised to call with any problems.   

## 2024-03-04 NOTE — Progress Notes (Signed)
 Remote PPM Transmission

## 2024-04-19 ENCOUNTER — Ambulatory Visit (INDEPENDENT_AMBULATORY_CARE_PROVIDER_SITE_OTHER): Admitting: *Deleted

## 2024-04-19 VITALS — BP 138/76 | HR 61 | Temp 98.8°F | Resp 18 | Ht 68.0 in | Wt 189.2 lb

## 2024-04-19 DIAGNOSIS — Z0000 Encounter for general adult medical examination without abnormal findings: Secondary | ICD-10-CM

## 2024-04-19 NOTE — Progress Notes (Signed)
 Please attest this visit in the absence of patient primary care provider.    Subjective:   Eric [name] is a 78 y.o. male who presents for a Medicare Annual Wellness Visit.  Allergies (verified) Atorvastatin, Ezetimibe, and Zetia [ezetimibe]   History: Past Medical History:  Diagnosis Date   Abnormal LFTs    Annual physical exam 04/15/2011   Anxiety    Body mass index (BMI) 30.0-30.9, adult 06/23/2019   Full dentures    GERD (gastroesophageal reflux disease)    with certain foods     Heart block AV complete (HCC)    Heart murmur    History of aortic valve stenosis 2011   06/ 2011 dx mild bicuspid stenosis;    then 04/ 2019 severe  stenosis   History of COVID-19 04/23/2019   positive result in epic;  per pt asymptomatic   History of subarachnoid hemorrhage 04/27/2019   hospital admission in epic, dx bilateral frontal SAH w/ left occipital skull fx nondisplaced due to syncope w/ fall secondary to dehydration/ ortho hypotension   History of supraventricular tachycardia 12/2017   followed by cardiology   HNP (herniated nucleus pulposus), lumbar 04/23/2018   Hyperglycemia 10/20/2014   Hyperlipidemia    Hypertension    followed by cardiology and pcp   Hypothyroidism    followed by pcp   Left bundle branch block (LBBB) 12/18/2017   post op AV replacement 12-17-2017   Lower urinary tract symptoms (LUTS)    Macular edema of right eye    Dr  Selinda Slocumb    OA (osteoarthritis)    multiple sites , followed by dr s. deveshwar   Pacemaker - STJ 05/28/2022   Presence of heart assist device (HCC) 02/02/2024   Prolapsed lumbar disc 03/03/2018   Prostate cancer [facility])    urologist--- dr watt--- dx 04/ 2022, Gleason 3+4, PSA 3.76   Rheumatoid arthritis (HCC) 11/17/2018   rheumotologist-- dr s. 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 12/17/2017   Edwards Intuity Elite rapid deployment stented bovine pericardial  tissue valve via right mini thoracotomy approach   SOB (shortness of breath)    per pt occasionally with yard work, but ok with stairs and normal activities   Spinal stenosis of lumbar region 04/23/2018   Subarachnoid hemorrhage (HCC) 04/28/2019   Wide-complex tachycardia 12/30/2017   (followed by dr monetta)  post cardioversion by EMS 12-30-2017 for SVT 170s post op AV replacement 12-17-2017 , CHF, AKI   Past Surgical History:  Procedure Laterality Date   AORTIC VALVE REPLACEMENT N/A 12/17/2017   Procedure: MINIMALLY INVASIVE AORTIC VALVE REPLACEMENT (AVR) [ using Edwards Intuity Valve Size 23mm;  Surgeon: Dusty Sudie DEL, MD;  Location: [facility] OR;  Service: Open Heart Surgery;  Laterality: N/A;  MINI THORACOTOMY   BASAL CELL CARCINOMA EXCISION Left 06/05/2023   left ear   CARDIAC CATHETERIZATION  11/10/2000   @[facility]  by dr fernande;  normal coronary angiography and normal LVF   CATARACT EXTRACTION W/ INTRAOCULAR LENS IMPLANT Right 2018   COLONOSCOPY     last one 02-29-2020 by dr h. danis   CYSTOSCOPY N/A 02/09/2021   Procedure: PHYLLIS;  Surgeon: Watt Rush, MD;  Location: [facility]  Service: Urology;  Laterality: N/A;   INGUINAL HERNIA REPAIR Right 2008   LAPAROSCOPIC CHOLECYSTECTOMY  06/26/2001   @[facility] ;  WITH OPEN LEFT INGUINAL HERNIA REPAIR   LUMBAR LAMINECTOMY/DECOMPRESSION MICRODISCECTOMY N/A 04/23/2018   Procedure: Microlumbar decompression Lumbar five-Sacral  one;  Surgeon: Duwayne Purchase, MD;  Location: [facility] OR;  Service: Orthopedics;  Laterality: N/A;   PACEMAKER IMPLANT N/A 01/23/2022   Procedure: PACEMAKER IMPLANT;  Surgeon: Fernande Elspeth BROCKS, MD;  Location: [facility] INVASIVE CV LAB;  Service: Cardiovascular;  Laterality: N/A;   PROSTATE BIOPSY  09/20/2020   RADIOACTIVE SEED IMPLANT N/A 02/09/2021   Procedure: RADIOACTIVE SEED IMPLANT/BRACHYTHERAPY IMPLANT;  Surgeon: Watt Rush, MD;  Location: [facility];  Service: Urology;  Laterality: N/A;  56 seeds    RIGHT/LEFT HEART CATH AND CORONARY ANGIOGRAPHY N/A 10/16/2017   Procedure: RIGHT/LEFT HEART CATH AND CORONARY ANGIOGRAPHY;  Surgeon: Wonda Sharper, MD;  Location: [facility] INVASIVE CV LAB;  Service: Cardiovascular;  Laterality: N/A;   SKIN CANCER EXCISION Left 2025   left ear   SPACE OAR INSTILLATION N/A 02/09/2021   Procedure: SPACE OAR INSTILLATION;  Surgeon: Watt Rush, MD;  Location: [facility];  Service: Urology;  Laterality: N/A;   TEE WITHOUT CARDIOVERSION N/A 12/17/2017   Procedure: TRANSESOPHAGEAL ECHOCARDIOGRAM (TEE);  Surgeon: Dusty Sudie DEL, MD;  Location: [facility] OR;  Service: Open Heart Surgery;  Laterality: N/A;   TEMPORARY PACEMAKER N/A 01/17/2022   Procedure: TEMPORARY PACEMAKER;  Surgeon: Claudene Victory ORN, MD;  Location: [facility] INVASIVE CV LAB;  Service: Cardiovascular;  Laterality: N/A;   TONSILLECTOMY  1960   TUMOR REMOVAL  06/30/2023   on back, sent for biopsy   Family History  Problem Relation Age of Onset   Diabetes Mother    Stroke Mother    Diabetes Brother        ?   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    Comment: 05/2008  Tobacco Use   Smoking status: Former    Current packs/day: 0.00    Average packs/day: 2.0 packs/day for 35.0 years (70.0 ttl pk-yrs)    Types: Cigarettes    Start date: 04/16/1955    Quit date: 04/15/1990    Years since quitting: 34.0    Passive exposure: Never   Smokeless tobacco: Never  Vaping Use   Vaping status: Never Used  Substance and Sexual Activity   Alcohol use: Not Currently   Drug use: Never   Sexual activity: Not Currently   Tobacco Counseling Counseling given:  Not Answered  SDOH Screenings   Food Insecurity: No Food Insecurity (04/19/2024)  Housing: Low Risk  (04/19/2024)  Transportation Needs: No Transportation Needs (04/19/2024)  Alcohol Screen: Low Risk  (07/09/2021)  Depression (PHQ2-9): Low Risk  (04/19/2024)  Financial Resource Strain: Patient Declined (10/24/2022)  Physical Activity: Insufficiently Active (04/19/2024)  Social Connections: Moderately Integrated (04/19/2024)  Stress: No Stress Concern Present (04/19/2024)  Tobacco Use: Medium Risk (04/19/2024)   Depression Screen    04/19/2024    9:46 AM 02/02/2024    7:55 AM 08/05/2023    1:51 PM 10/25/2022    8:13 AM 06/27/2022    9:04 AM 12/24/2021   10:15 AM 07/09/2021   10:45 AM  PHQ 2/9 Scores  PHQ - 2 Score 1 0 0 0 0 0 0  PHQ- 9 Score 2 0           Data saved with a previous flowsheet row definition      Goals Addressed             This Visit's Progress    Increase physical activity   On track      Visit info / Clinical Intake: Medicare Wellness Visit Type:: Subsequent Annual Wellness Visit Medicare Wellness Visit Mode:: In-person (required for WTM) Interpreter Needed?: No Pre-visit prep was completed: yes AWV questionnaire completed by patient prior to visit?: no Living arrangements:: lives with spouse/significant other Patient's Overall Health Status Rating: very good Typical amount of pain: some (back / hands) Does pain affect daily life?: (!) yes (only once in a while, otherwise can still function) Are you currently prescribed opioids?: no  Dietary Habits and Nutritional Risks How many meals a day?: 3 Most meals are obtained by: preparing own meals Diabetic:: no  Functional Status Activities of Daily Living (to include ambulation/medication): Independent Ambulation: Independent Medication Administration: Independent Home Management: Independent Manage your own finances?: yes Primary transportation is: driving Concerns about vision?: no *vision  screening is required for WTM* (up to date with Selinda Slocumb every 6 months) Concerns about hearing?: no (notes slight change)  Fall Screening Falls in the past year?: 0 Number of falls in past year: 0 Was there an injury with Fall?: 0 Fall Risk Category Calculator: 0 Patient Fall Risk Level: Low Fall Risk  Fall Risk Patient at Risk for Falls Due to: No Fall Risks Fall risk Follow up: Falls evaluation completed  Home and Transportation Safety: All rugs have non-skid backing?: yes All stairs or steps have railings?: yes Grab bars in the bathtub or shower?: (!) no Have non-skid surface in bathtub or shower?: yes Good home lighting?: yes Regular seat belt use?: yes Hospital stays in the last year:: no  Cognitive Assessment Difficulty concentrating, remembering, or making decisions? : yes (notes some forgetfulness) Will 6CIT or Mini Cog be Completed: yes What year is it?: 0 points What month is it?: 0 points Give patient an address phrase to remember (5 components): [address] [place]  About what time is it?: 0 points Count backwards from 20 to 1: 0 points Say the months of the year in reverse: 0 points Repeat the address phrase from earlier: 4 points 6 CIT Score: 4 points  Advance Directives (For Healthcare) Does Patient Have a Medical Advance Directive?: Yes Does patient want to make changes to medical advance directive?: No - Patient declined Type of Advance Directive: Healthcare Power of Attorney Copy of Healthcare Power of Attorney in Chart?: Yes - validated most recent copy scanned in chart (See row information)  Reviewed/Updated  Reviewed/Updated: All        Objective:    Today's Vitals   04/19/24 0927 04/19/24 1010  BP: (!) 154/68 138/76  Pulse: 61   Resp: 18   Temp: 98.8 F (37.1 C)   TempSrc: Oral   SpO2: 97%   Weight: 189 lb 3.2 oz (85.8 kg)   Height: 5' 8 (1.727 m)    Body mass index is 28.77 kg/m.  Current Medications  (verified) Outpatient Encounter Medications as of 04/19/2024  Medication Sig   acetaminophen  (TYLENOL ) 500 MG tablet Take 500 mg by mouth every 6 (six) hours as needed for mild pain or headache.   aspirin  EC 81 MG tablet Take 81 mg  by mouth daily.   carvedilol  (COREG ) 12.5 MG tablet TAKE 1 TABLET BY MOUTH 2 TIMES DAILY.   Cholecalciferol  (VITAMIN D3) 50 MCG (2000 UT) TABS Take 1 tablet (2,000 Units total) by mouth every other day.   folic acid  (FOLVITE ) 1 MG tablet TAKE 1 TABLET BY MOUTH EVERY DAY   levothyroxine  (SYNTHROID ) 88 MCG tablet Take 1 tablet (88 mcg total) by mouth daily before breakfast.   methotrexate  (RHEUMATREX) 2.5 MG tablet TAKE 3 TABLETS (7.5 MG TOTAL) BY MOUTH ONCE A WEEK. CAUTION:CHEMOTHERAPY. PROTECT FROM LIGHT.   rosuvastatin  (CRESTOR ) 20 MG tablet Take 1 tablet (20 mg total) by mouth at bedtime.   tobramycin (TOBREX) 0.3 % ophthalmic solution SMARTSIG:In Eye(s)   No facility-administered encounter medications on file as of 04/19/2024.   Hearing/Vision screen No results found. Immunizations and Health Maintenance Health Maintenance  Topic Date Due   COVID-19 Vaccine (4 - 2025-26 season) 04/19/2025 (Originally 02/09/2024)   Zoster Vaccines- Shingrix  (1 of 2) 11/02/2026 (Originally 01/18/1965)   Medicare Annual Wellness (AWV)  04/19/2025   DTaP/Tdap/Td (5 - Td or Tdap) 10/10/2028   Pneumococcal Vaccine: 50+ Years  Completed   Influenza Vaccine  Completed   Hepatitis C Screening  Completed   Meningococcal B Vaccine  Aged Out   Colonoscopy  Discontinued        Assessment/Plan:  This is a routine wellness examination for Jaxyn.  Patient Care Team: Amon Aloysius BRAVO, MD as PCP - General (Internal Medicine) Monetta Redell PARAS, MD as PCP - Cardiology (Cardiology) Cindie Ole DASEN, MD as PCP - Electrophysiology (Cardiology) Jarold Mayo, MD as Consulting Physician (Ophthalmology) Duwayne Purchase, MD as Consulting Physician (Orthopedic Surgery) Watt Rush, MD as  Attending Physician (Urology) Patrcia Cough, MD as Consulting Physician (Radiation Oncology) Crawford, Morna Pickle, NP as Nurse Practitioner (Hematology and Oncology) Amon Aloysius BRAVO, MD  I have personally reviewed and noted the following in the patient's chart:   Medical and social history Use of alcohol, tobacco or illicit drugs  Current medications and supplements including opioid prescriptions. Functional ability and status Nutritional status Physical activity Advanced directives List of other physicians Hospitalizations, surgeries, and ER visits in previous 12 months Vitals Screenings to include cognitive, depression, and falls Referrals and appointments  No orders of the defined types were placed in this encounter.  In addition, I have reviewed and discussed with patient certain preventive protocols, quality metrics, and best practice recommendations. A written personalized care plan for preventive services as well as general preventive health recommendations were provided to patient.   Lolita Libra, CMA   04/19/2024   Return in 1 year (on 04/19/2025).  After Visit Summary: (In Person-Printed) AVS printed and given to the patient  Nurse Notes: nothing significant to report

## 2024-04-19 NOTE — Patient Instructions (Addendum)
 Eric [name],  Thank you for taking the time for your Medicare Wellness Visit. I appreciate your continued commitment to your health goals. Please review the care plan we discussed, and feel free to reach out if I can assist you further.  Please note that Annual Wellness Visits do not include a physical exam. Some assessments may be limited, especially if the visit was conducted virtually. If needed, we may recommend an in-person follow-up with your provider.  Goal:  To keep increasing physical activity.  Congratulations on your success so far, keep up the good work!  Ongoing Care Seeing your primary care provider every 3 to 6 months helps us  monitor your health and provide consistent, personalized care.   Dr Amon: 08/10/24 9:20am Annual Wellness Visit: 04/21/25 9am, in person  Recommended Screenings:  Health Maintenance  Topic Date Due   Medicare Annual Wellness Visit  Never done   COVID-19 Vaccine (4 - 2025-26 season) 04/19/2025*   Zoster (Shingles) Vaccine (1 of 2) 11/02/2026*   DTaP/Tdap/Td vaccine (5 - Td or Tdap) 10/10/2028   Pneumococcal Vaccine for age over 97  Completed   Flu Shot  Completed   Hepatitis C Screening  Completed   Meningitis B Vaccine  Aged Out   Colon Cancer Screening  Discontinued  *Topic was postponed. The date shown is not the original due date.       04/19/2024    9:34 AM  Advanced Directives  Does Patient Have a Medical Advance Directive? Yes  Type of Advance Directive Healthcare Power of Attorney  Does patient want to make changes to medical advance directive? No - Patient declined  Copy of Healthcare Power of Attorney in Chart? Yes - validated most recent copy scanned in chart (See row information)  Once completed and notarized, you may return a copy of your Advanced Directive(s) by either of the following:  Bring a copy of your health care power of attorney and living will to the office to be added to your chart at your convenience. You can mail a  copy to [facility] [address] [place], [place] [address] or email to [email or web address]   Vision: Annual vision screenings are recommended for early detection of glaucoma, cataracts, and diabetic retinopathy. These exams can also reveal signs of chronic conditions such as diabetes and high blood pressure.  Dental: Annual dental screenings help detect early signs of oral cancer, gum disease, and other conditions linked to overall health, including heart disease and diabetes.  Please see the attached documents for additional preventive care recommendations.

## 2024-04-22 ENCOUNTER — Ambulatory Visit: Payer: Medicare Other

## 2024-04-22 DIAGNOSIS — I442 Atrioventricular block, complete: Secondary | ICD-10-CM

## 2024-04-23 LAB — CUP PACEART REMOTE DEVICE CHECK
Battery Remaining Longevity: 100 mo
Battery Remaining Percentage: 83 %
Battery Voltage: 3.02 V
Brady Statistic AP VP Percent: 1 %
Brady Statistic AP VS Percent: 50 %
Brady Statistic AS VP Percent: 1 %
Brady Statistic AS VS Percent: 50 %
Brady Statistic RA Percent Paced: 50 %
Brady Statistic RV Percent Paced: 1 %
Date Time Interrogation Session: 20251113020022
Implantable Lead Connection Status: 753985
Implantable Lead Connection Status: 753985
Implantable Lead Implant Date: 20230816
Implantable Lead Implant Date: 20230816
Implantable Lead Location: 753859
Implantable Lead Location: 753860
Implantable Lead Model: 3830
Implantable Pulse Generator Implant Date: 20230816
Lead Channel Impedance Value: 480 Ohm
Lead Channel Impedance Value: 600 Ohm
Lead Channel Pacing Threshold Amplitude: 0.5 V
Lead Channel Pacing Threshold Amplitude: 1 V
Lead Channel Pacing Threshold Pulse Width: 0.5 ms
Lead Channel Pacing Threshold Pulse Width: 0.5 ms
Lead Channel Sensing Intrinsic Amplitude: 0.9 mV
Lead Channel Sensing Intrinsic Amplitude: 12 mV
Lead Channel Setting Pacing Amplitude: 2 V
Lead Channel Setting Pacing Amplitude: 2.5 V
Lead Channel Setting Pacing Pulse Width: 0.5 ms
Lead Channel Setting Sensing Sensitivity: 2 mV
Pulse Gen Model: 2272
Pulse Gen Serial Number: 8101539

## 2024-04-27 ENCOUNTER — Ambulatory Visit: Payer: Self-pay | Admitting: Cardiology

## 2024-04-27 NOTE — Progress Notes (Signed)
 Remote PPM Transmission

## 2024-05-04 NOTE — Progress Notes (Unsigned)
 Office Visit Note  Patient: Eric [name]             Date of Birth: 08/30/1945           MRN: [id number]             PCP: Amon Aloysius BRAVO, MD Referring: Amon Aloysius BRAVO, MD Visit Date: 05/18/2024 Occupation: Data Unavailable  Subjective:  Medication monitoring   History of Present Illness: [name] is a 78 y.o. male with history of seronegative rheumatoid arthritis. Patient remains on  Methotrexate  3 tablets by mouth once weekly and folic acid  1 mg po daily.  He is tolerating low-dose methotrexate  without any side effects and has not had any gaps in therapy.  He denies any signs or symptoms of a flare.  He has not been experiencing any morning stiffness or nocturnal pain.  He denies any difficulty performing ADLs.  He has not noticed any joint swelling.  He denies any recent or recurrent infections.   Activities of Daily Living:  Patient reports morning stiffness for 0 minute.   Patient Reports nocturnal pain.  Difficulty dressing/grooming: Denies Difficulty climbing stairs: Denies Difficulty getting out of chair: Denies Difficulty using hands for taps, buttons, cutlery, and/or writing: Denies  Review of Systems  Constitutional:  Negative for fatigue.  HENT:  Negative for mouth sores and mouth dryness.   Eyes:  Negative for dryness.  Respiratory:  Positive for shortness of breath.   Cardiovascular:  Negative for chest pain and palpitations.  Gastrointestinal:  Negative for blood in stool, constipation and diarrhea.  Endocrine: Negative for increased urination.  Genitourinary:  Negative for involuntary urination.  Musculoskeletal:  Positive for joint pain, gait problem, joint pain, myalgias, muscle weakness and myalgias. Negative for joint swelling, morning stiffness and muscle tenderness.  Skin:  Negative for color change, rash, hair loss and sensitivity to sunlight.  Allergic/Immunologic: Negative for susceptible to infections.  Neurological:  Negative for dizziness and  headaches.  Hematological:  Negative for swollen glands.  Psychiatric/Behavioral:  Negative for depressed mood and sleep disturbance. The patient is not nervous/anxious.     PMFS History:  Patient Active Problem List   Diagnosis Date Noted   Presence of heart assist device (HCC) 02/02/2024   Follow-up exam 02/02/2024   SOB (shortness of breath)    Pacemaker - STJ 05/28/2022   Heart block AV complete (HCC)    Prostate cancer (HCC) 10/24/2020   Heart murmur    GERD (gastroesophageal reflux disease)    OA (osteoarthritis)    Anxiety    Body mass index (BMI) 30.0-30.9, adult 06/23/2019   Subarachnoid hemorrhage (HCC) 04/28/2019   History of COVID-19 04/23/2019   Rheumatoid arthritis (HCC) 11/17/2018   Spinal stenosis of lumbar region 04/23/2018   HNP (herniated nucleus pulposus), lumbar 04/23/2018   Prolapsed lumbar disc 03/03/2018   Wide-complex tachycardia 12/30/2017   Left bundle branch block (LBBB) 12/18/2017   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 12/17/2017   Lower urinary tract symptoms (LUTS) 12/2017   Abnormal LFTs    Macular edema of right eye    PCP NOTES >>>>>>>>>>>> 04/27/2015   Hyperglycemia 10/20/2014   Annual physical exam 04/15/2011   History of aortic valve stenosis 2011   Hypothyroidism 10/13/2007   Hyperlipidemia 10/13/2007   Hypertension 04/08/2007    Past Medical History:  Diagnosis Date   Abnormal LFTs    Annual physical exam 04/15/2011   Anxiety    Body mass index (BMI) 30.0-30.9, adult  06/23/2019   Full dentures    GERD (gastroesophageal reflux disease)    with certain foods     Heart block AV complete (HCC)    Heart murmur    History of aortic valve stenosis 2011   06/ 2011 dx mild bicuspid stenosis;    then 04/ 2019 severe  stenosis   History of COVID-19 04/23/2019   positive result in epic;  per pt asymptomatic   History of subarachnoid hemorrhage 04/27/2019   hospital admission in epic, dx bilateral  frontal SAH w/ left occipital skull fx nondisplaced due to syncope w/ fall secondary to dehydration/ ortho hypotension   History of supraventricular tachycardia 12/2017   followed by cardiology   HNP (herniated nucleus pulposus), lumbar 04/23/2018   Hyperglycemia 10/20/2014   Hyperlipidemia    Hypertension    followed by cardiology and pcp   Hypothyroidism    followed by pcp   Left bundle branch block (LBBB) 12/18/2017   post op AV replacement 12-17-2017   Lower urinary tract symptoms (LUTS)    Macular edema of right eye    Dr  Selinda Slocumb    OA (osteoarthritis)    multiple sites , followed by dr s. deveshwar   Pacemaker - STJ 05/28/2022   Presence of heart assist device (HCC) 02/02/2024   Prolapsed lumbar disc 03/03/2018   Prostate cancer [facility])    urologist--- dr watt--- dx 04/ 2022, Gleason 3+4, PSA 3.76   Rheumatoid arthritis (HCC) 11/17/2018   rheumotologist-- dr s. 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   Coronary artery disease Brother        Male 1st degree relative >50, had CABG in his 65s   Emphysema Father    COPD Father    Diabetes Daughter    Colon cancer Neg Hx    Prostate cancer Neg Hx    Colon polyps Neg Hx    Esophageal cancer Neg Hx    Rectal cancer Neg Hx    Stomach cancer Neg Hx    Past  Surgical History:  Procedure Laterality Date   AORTIC VALVE REPLACEMENT N/A 12/17/2017   Procedure: MINIMALLY INVASIVE AORTIC VALVE REPLACEMENT (AVR) [ using Edwards Intuity Valve Size 23mm;  Surgeon: Dusty Sudie DEL, MD;  Location: [facility] OR;  Service: Open Heart Surgery;  Laterality: N/A;  MINI THORACOTOMY   BASAL CELL CARCINOMA EXCISION Left 06/05/2023   left ear   CARDIAC CATHETERIZATION  11/10/2000   @[facility]  by dr fernande;  normal coronary angiography and normal LVF   CATARACT EXTRACTION W/ INTRAOCULAR LENS IMPLANT Right 2018   COLONOSCOPY     last one 02-29-2020 by dr h. danis   CYSTOSCOPY N/A 02/09/2021   Procedure: PHYLLIS;  Surgeon: Watt Rush, MD;  Location: [facility];  Service: Urology;  Laterality: N/A;   INGUINAL HERNIA REPAIR Right 2008   LAPAROSCOPIC CHOLECYSTECTOMY  06/26/2001   @[facility] ;  WITH OPEN LEFT INGUINAL HERNIA REPAIR   LUMBAR LAMINECTOMY/DECOMPRESSION MICRODISCECTOMY N/A 04/23/2018   Procedure: Microlumbar decompression Lumbar five-Sacral one;  Surgeon: Duwayne Purchase, MD;  Location: [facility] OR;  Service: Orthopedics;  Laterality: N/A;   PACEMAKER IMPLANT N/A 01/23/2022   Procedure: PACEMAKER IMPLANT;  Surgeon: Fernande Elspeth BROCKS, MD;  Location: [facility] INVASIVE CV LAB;  Service: Cardiovascular;  Laterality: N/A;   PROSTATE BIOPSY  09/20/2020   RADIOACTIVE SEED IMPLANT N/A 02/09/2021   Procedure: RADIOACTIVE SEED IMPLANT/BRACHYTHERAPY IMPLANT;  Surgeon: Watt Rush, MD;  Location[facility];  Service: Urology;  Laterality: N/A;  56 seeds   RIGHT/LEFT HEART CATH AND CORONARY ANGIOGRAPHY N/A 10/16/2017   Procedure: RIGHT/LEFT HEART CATH AND CORONARY ANGIOGRAPHY;  Surgeon: Wonda Sharper, MD;  Location: [facility] INVASIVE CV LAB;  Service: Cardiovascular;  Laterality: N/A;   SKIN CANCER EXCISION Left 2025   left ear   SPACE OAR INSTILLATION N/A 02/09/2021   Procedure: SPACE OAR INSTILLATION;  Surgeon: Watt Rush, MD;  Location: [facility];   Service: Urology;  Laterality: N/A;   TEE WITHOUT CARDIOVERSION N/A 12/17/2017   Procedure: TRANSESOPHAGEAL ECHOCARDIOGRAM (TEE);  Surgeon: Dusty Sudie DEL, MD;  Location[facility] OR;  Service: Open Heart Surgery;  Laterality: N/A;   TEMPORARY PACEMAKER N/A 01/17/2022   Procedure: TEMPORARY PACEMAKER;  Surgeon: Claudene Victory ORN, MD;  Location: [facility] INVASIVE CV LAB;  Service: Cardiovascular;  Laterality: N/A;   TONSILLECTOMY  1960   TUMOR REMOVAL  06/30/2023   on back, sent for biopsy   Social History   Tobacco Use   Smoking status: Former    Current packs/day: 0.00    Average packs/day: 2.0 packs/day for 35.0 years (70.0 ttl pk-yrs)    Types: Cigarettes    Start date: 04/16/1955    Quit date: 04/15/1990    Years since quitting: 34.1    Passive exposure: Never   Smokeless tobacco: Never  Vaping Use   Vaping status: Never Used  Substance Use Topics   Alcohol use: Not Currently   Drug use: Never   Social History   Social History Narrative    Lives w/ wife   3 children, 7 g-kids ,  1g-g-child               Immunization History  Administered Date(s) Administered   Fluad Quad(high Dose 65+) 02/12/2019, 03/14/2020, 05/15/2021, 03/14/2022   Fluad Trivalent(High Dose 65+) 02/18/2023   INFLUENZA, HIGH DOSE SEASONAL PF 04/04/2015, 03/28/2016, 03/18/2017, 03/16/2018, 02/27/2024   Influenza Split 03/11/2013, 02/17/2014   Influenza Whole 04/13/2007, 03/15/2008, 03/10/2012   PFIZER(Purple Top)SARS-COV-2 Vaccination 08/08/2019, 08/31/2019, 03/24/2020   PNEUMOCOCCAL CONJUGATE-20 06/26/2021   Pneumococcal Conjugate-13 04/21/2014   Pneumococcal Polysaccharide-23 04/15/2012   Td 06/10/1996, 06/10/2000   Tdap 04/15/2011, 10/11/2018   Zoster, Live 03/29/2015     Objective: Vital Signs: BP (!) 147/83   Pulse 61   Temp 98.1 F (36.7 C)   Resp 14   Ht 5' 7.5 (1.715 m)   Wt 188 lb 12.8 oz (85.6 kg)   BMI 29.13 kg/m    Physical Exam Vitals and nursing note reviewed.  Constitutional:       Appearance: He is well-developed.  HENT:     Head: Normocephalic and atraumatic.  Eyes:     Conjunctiva/sclera: Conjunctivae normal.     Pupils: Pupils are equal, round, and reactive to light.  Cardiovascular:  Rate and Rhythm: Normal rate and regular rhythm.     Heart sounds: Normal heart sounds.  Pulmonary:     Effort: Pulmonary effort is normal.     Breath sounds: Normal breath sounds.  Abdominal:     General: Bowel sounds are normal.     Palpations: Abdomen is soft.  Musculoskeletal:     Cervical back: Normal range of motion and neck supple.  Skin:    General: Skin is warm and dry.     Capillary Refill: Capillary refill takes less than 2 seconds.  Neurological:     Mental Status: He is alert and oriented to person, place, and time.  Psychiatric:        Behavior: Behavior normal.      Musculoskeletal Exam: C-spine has slightly limited ROM with lateral rotation.  Thoracic spine and lumbar spine have good range of motion.  No midline spinal tenderness.  No SI joint tenderness.  Shoulder joints, elbow joints, wrist joints, MCPs, PIPs, DIPs have good range of motion with no synovitis.  CMC, PIP, and DIP thickening.  Complete fist formation bilaterally.  Hip joints have slightly limited ROM but no discomfort.  Knee joints have good range of motion no warmth or effusion.  Ankle joints have good range of motion no tenderness or joint swelling.    CDAI Exam: CDAI Score: -- Patient Global: --; Provider Global: -- Swollen: --; Tender: -- Joint Exam 05/18/2024   No joint exam has been documented for this visit   There is currently no information documented on the homunculus. Go to the Rheumatology activity and complete the homunculus joint exam.  Investigation: No additional findings.  Imaging: CUP PACEART REMOTE DEVICE CHECK Result Date: 04/23/2024 PPM scheduled remote reviewed. Normal device function.  Presenting rhythm: AS-VS 60s Next remote transmission per protocol.  AB, CVRS   Recent Labs: Lab Results  Component Value Date   WBC 4.3 02/24/2024   HGB 14.1 02/24/2024   PLT 184 02/24/2024   NA 140 02/24/2024   K 4.6 02/24/2024   CL 103 02/24/2024   CO2 30 02/24/2024   GLUCOSE 109 (H) 02/24/2024   BUN 11 02/24/2024   CREATININE 1.18 02/24/2024   BILITOT 1.0 02/24/2024   ALKPHOS 58 01/20/2022   AST 29 02/24/2024   ALT 19 02/24/2024   PROT 6.7 02/24/2024   ALBUMIN  3.1 (L) 01/20/2022   CALCIUM  9.3 02/24/2024   GFRAA 71 11/13/2020   QFTBGOLDPLUS NEGATIVE 08/24/2018    Speciality Comments: No specialty comments available.  Procedures:  No procedures performed Allergies: Atorvastatin, Ezetimibe, and Zetia [ezetimibe]   Assessment / Plan:     Visit Diagnoses: Seronegative rheumatoid arthritis (HCC): He has no joint tenderness or synovitis on examination today.  He has not had any signs or symptoms of a rheumatoid arthritis flare.  He has clinically been doing well taking methotrexate  3 tablets by mouth once weekly and folic acid  1 mg daily.  He is tolerating methotrexate  without any side effects and has not had any gaps in therapy.  No recent or recurrent infections.  Plan to update CBC and CMP today to monitor for drug toxicity.  He has not been experiencing any morning stiffness, nocturnal pain, or difficulty performing ADLs.  No medication changes will be made at this time.  He was advised to notify us  if he develops any signs or symptoms of a flare.  He will follow-up in the office in 5 months or sooner if needed.  High risk medication use -  Methotrexate  3 tablets by mouth once weekly and folic acid  1 mg po daily.  CBC and CMP updated on 02/24/24.  Orders for CBC and CMP released today.   Lipid panel 08/05/23.   No recent or recurrent infections.  Discussed the importance of holding methotrexate  if he develops signs or symptoms of an infection and to resume once the infection has completely cleared.   Patient received the annual flu shot. - Plan:  CBC with Differential/Platelet, Comprehensive metabolic panel with GFR  Chronic right shoulder pain: Not currently symptomatic.  Good range of motion with no discomfort currently.  No tenderness upon palpation.  Primary osteoarthritis of both hands: CMC, PIP, DIP thickening consistent with osteoarthritis of both hands.  No synovitis noted.  Primary osteoarthritis of both knees: He has good range of motion of both knee joints with no warmth or effusion noted.  Primary osteoarthritis of both feet: Good ROM of both ankle joints with no tenderness or joint swelling.  Patient is wearing proper fitting shoes.  Spinal stenosis of lumbar region, unspecified whether neurogenic claudication present: No midline spinal tenderness.  No SI joint tenderness.  No symptoms of radiculopathy.   Osteopenia of multiple sites - DEXA 04/23/2019 RFN BMD 0.776 with T score -1.1.  DEXA updated on 04/03/22: LFN BMD 0.744 with T-score -1.0.  We will repeat DEXA scan in 2028.  Essential hypertension: Blood pressure was slightly elevated today in the office and was rechecked prior to leaving.  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    Follow-Up Instructions: Return in about 5 months (around 10/16/2024) for Rheumatoid arthritis.   Waddell CHRISTELLA Craze, PA-C  Note - This record has been created using Dragon software.  Chart creation errors have been sought, but may not always  have been located. Such creation errors do not reflect on  the standard of medical care.

## 2024-05-07 ENCOUNTER — Other Ambulatory Visit: Payer: Self-pay | Admitting: Physician Assistant

## 2024-05-10 NOTE — Telephone Encounter (Signed)
 Last Fill: 02/16/2024  Labs: 02/24/2024 CBC and CMP are stable. MCV is mildly elevated.   Next Visit: 05/18/2024  Last Visit: 12/17/2023  DX: Seronegative rheumatoid arthritis   Current Dose per office note 12/17/2023: Methotrexate  3 tablets by mouth once weekly   Okay to refill Methotrexate ?

## 2024-05-18 ENCOUNTER — Encounter: Payer: Self-pay | Admitting: Physician Assistant

## 2024-05-18 ENCOUNTER — Ambulatory Visit: Attending: Physician Assistant | Admitting: Physician Assistant

## 2024-05-18 VITALS — BP 146/84 | HR 84 | Temp 98.1°F | Resp 14 | Ht 67.5 in | Wt 188.8 lb

## 2024-05-18 DIAGNOSIS — M0600 Rheumatoid arthritis without rheumatoid factor, unspecified site: Secondary | ICD-10-CM

## 2024-05-18 DIAGNOSIS — Z840 Family history of diseases of the skin and subcutaneous tissue: Secondary | ICD-10-CM

## 2024-05-18 DIAGNOSIS — M19041 Primary osteoarthritis, right hand: Secondary | ICD-10-CM

## 2024-05-18 DIAGNOSIS — M48061 Spinal stenosis, lumbar region without neurogenic claudication: Secondary | ICD-10-CM

## 2024-05-18 DIAGNOSIS — C61 Malignant neoplasm of prostate: Secondary | ICD-10-CM

## 2024-05-18 DIAGNOSIS — I447 Left bundle-branch block, unspecified: Secondary | ICD-10-CM

## 2024-05-18 DIAGNOSIS — Z8639 Personal history of other endocrine, nutritional and metabolic disease: Secondary | ICD-10-CM

## 2024-05-18 DIAGNOSIS — Z79899 Other long term (current) drug therapy: Secondary | ICD-10-CM

## 2024-05-18 DIAGNOSIS — M19071 Primary osteoarthritis, right ankle and foot: Secondary | ICD-10-CM

## 2024-05-18 DIAGNOSIS — Z950 Presence of cardiac pacemaker: Secondary | ICD-10-CM

## 2024-05-18 DIAGNOSIS — Z953 Presence of xenogenic heart valve: Secondary | ICD-10-CM

## 2024-05-18 DIAGNOSIS — M8589 Other specified disorders of bone density and structure, multiple sites: Secondary | ICD-10-CM

## 2024-05-18 DIAGNOSIS — G8929 Other chronic pain: Secondary | ICD-10-CM

## 2024-05-18 DIAGNOSIS — Z85828 Personal history of other malignant neoplasm of skin: Secondary | ICD-10-CM

## 2024-05-18 DIAGNOSIS — Z8659 Personal history of other mental and behavioral disorders: Secondary | ICD-10-CM

## 2024-05-18 DIAGNOSIS — I10 Essential (primary) hypertension: Secondary | ICD-10-CM

## 2024-05-18 DIAGNOSIS — M170 Bilateral primary osteoarthritis of knee: Secondary | ICD-10-CM

## 2024-05-18 LAB — CBC WITH DIFFERENTIAL/PLATELET
Absolute Lymphocytes: 940 {cells}/uL (ref 850–3900)
Absolute Monocytes: 480 {cells}/uL (ref 200–950)
Basophils Absolute: 10 {cells}/uL (ref 0–200)
Basophils Relative: 0.2 %
Eosinophils Absolute: 50 {cells}/uL (ref 15–500)
Eosinophils Relative: 1 %
HCT: 44.6 % (ref 39.4–51.1)
Hemoglobin: 14.8 g/dL (ref 13.2–17.1)
MCH: 32.8 pg (ref 27.0–33.0)
MCHC: 33.2 g/dL (ref 31.6–35.4)
MCV: 98.9 fL (ref 81.4–101.7)
MPV: 9.7 fL (ref 7.5–12.5)
Monocytes Relative: 9.6 %
Neutro Abs: 3520 {cells}/uL (ref 1500–7800)
Neutrophils Relative %: 70.4 %
Platelets: 196 Thousand/uL (ref 140–400)
RBC: 4.51 Million/uL (ref 4.20–5.80)
RDW: 13.1 % (ref 11.0–15.0)
Total Lymphocyte: 18.8 %
WBC: 5 Thousand/uL (ref 3.8–10.8)

## 2024-05-18 LAB — COMPREHENSIVE METABOLIC PANEL WITH GFR
AG Ratio: 1.7 (calc) (ref 1.0–2.5)
ALT: 23 U/L (ref 9–46)
AST: 36 U/L — ABNORMAL HIGH (ref 10–35)
Albumin: 4.7 g/dL (ref 3.6–5.1)
Alkaline phosphatase (APISO): 70 U/L (ref 35–144)
BUN: 12 mg/dL (ref 7–25)
CO2: 31 mmol/L (ref 20–32)
Calcium: 9.9 mg/dL (ref 8.6–10.3)
Chloride: 100 mmol/L (ref 98–110)
Creat: 1.28 mg/dL (ref 0.70–1.28)
Globulin: 2.7 g/dL (ref 1.9–3.7)
Glucose, Bld: 105 mg/dL — ABNORMAL HIGH (ref 65–99)
Potassium: 4.7 mmol/L (ref 3.5–5.3)
Sodium: 138 mmol/L (ref 135–146)
Total Bilirubin: 1.2 mg/dL (ref 0.2–1.2)
Total Protein: 7.4 g/dL (ref 6.1–8.1)
eGFR: 57 mL/min/1.73m2 — ABNORMAL LOW (ref >=60)

## 2024-05-18 NOTE — Patient Instructions (Signed)
 Standing Labs We placed an order today for your standing lab work.   Please have your standing labs drawn in March and every 3 months   Please have your labs drawn 2 weeks prior to your appointment so that the provider can discuss your lab results at your appointment, if possible.  Please note that you may see your imaging and lab results in MyChart before we have reviewed them. We will contact you once all results are reviewed. Please allow our office up to 72 hours to thoroughly review all of the results before contacting the office for clarification of your results.  WALK-IN LAB HOURS  Monday through Thursday from 8:00 am - 4:30 pm and Friday from 8:00 am-12:00 pm.  Patients with office visits requiring labs will be seen before walk-in labs.  You may encounter longer than normal wait times. Please allow additional time. Wait times may be shorter on  Monday and Thursday afternoons.  We do not book appointments for walk-in labs. We appreciate your patience and understanding with our staff.   Labs are drawn by [facility]. Please bring your co-pay at the time of your lab draw.  You may receive a bill from [facility] for your lab work.  Please note if you are on Hydroxychloroquine and and an order has been placed for a Hydroxychloroquine level,  you will need to have it drawn 4 hours or more after your last dose.  If you wish to have your labs drawn at another location, please call the office 24 hours in advance so we can fax the orders.  The office is located at [address], [place], [place] [address]   If you have any questions regarding directions or hours of operation,  please call [phone].   As a reminder, please drink plenty of water  prior to coming for your lab work. Thanks!

## 2024-05-19 ENCOUNTER — Ambulatory Visit: Payer: Self-pay | Admitting: Physician Assistant

## 2024-05-19 NOTE — Progress Notes (Signed)
 CBC WNL GFR is slighlty low-57-gradually trending down.  AST is elevated-36.  Please clarify if he is taking any tylenol , NSAIDs, or alcohol use? If not-please recommend reducing methotrexate  to 2 tablets once weekly-recheck CMP in 2-3 weeks.

## 2024-05-20 ENCOUNTER — Other Ambulatory Visit: Payer: Self-pay | Admitting: Internal Medicine

## 2024-05-26 MED ORDER — METHOTREXATE SODIUM 2.5 MG PO TABS: 5.0000 mg | ORAL_TABLET | ORAL | 0 refills | Status: AC

## 2024-05-26 NOTE — Telephone Encounter (Signed)
 Patient contacted the office and inquired if he should be changing his dose methotrexate . Please advise.

## 2024-05-26 NOTE — Progress Notes (Signed)
 Yes-recommend reducing methotrexate  to 2 tablets weekly--recheck CMP in 2-3 weeks after dose change.

## 2024-06-17 ENCOUNTER — Telehealth: Payer: Self-pay

## 2024-06-17 DIAGNOSIS — M069 Rheumatoid arthritis, unspecified: Secondary | ICD-10-CM

## 2024-06-17 DIAGNOSIS — C61 Malignant neoplasm of prostate: Secondary | ICD-10-CM

## 2024-06-17 DIAGNOSIS — Z0100 Encounter for examination of eyes and vision without abnormal findings: Secondary | ICD-10-CM

## 2024-06-17 DIAGNOSIS — Z1283 Encounter for screening for malignant neoplasm of skin: Secondary | ICD-10-CM

## 2024-06-17 NOTE — Telephone Encounter (Signed)
 Referrals placed

## 2024-06-17 NOTE — Telephone Encounter (Signed)
 Copied from CRM [id number]. Topic: Referral - Request for Referral >> Jun 17, 2024  9:48 AM Charolett L wrote: Did the patient discuss referral with their provider in the last year? Yes (If No - schedule appointment) (If Yes - send message)  Appointment offered? Yes  Type of order/referral and detailed reason for visit: rheumatologist [address]  Preference of office, provider, location: Shaili B. Dolphus, MD [facility] [address] [place], [place] [address] [phone]  If referral order, have you been seen by this specialty before? Yes (If Yes, this issue or another issue? When? Where? Patient has an appt 06/18/2024 Can we respond through MyChart? Yes

## 2024-06-17 NOTE — Telephone Encounter (Signed)
 Copied from CRM [id number]. Topic: Referral - Request for Referral >> Jun 17, 2024  9:41 AM Charolett L wrote: Did the patient discuss referral with their provider in the last year? Yes (If No - schedule appointment) (If Yes - send message)  Appointment offered? Yes  Type of order/referral and detailed reason for visit: Urologist  Preference of office, provider, location: Norleen PARAS. Watt, M.D. [facility], [address] [place], [place], [place] [address] Phone: [phone] FAX# [phone]  If referral order, have you been seen by this specialty before? Yes (If Yes, this issue or another issue? When? Where? Patient has an appt 06/28/24 and 07/05/24 Can we respond through MyChart? Yes

## 2024-06-17 NOTE — Telephone Encounter (Signed)
 Copied from CRM #[id number]. Topic: Referral - Request for Referral >> Jun 17, 2024  9:52 AM Charolett L wrote: Did the patient discuss referral with their provider in the last year? Yes (If No - schedule appointment) (If Yes - send message)  Appointment offered? Yes  Type of order/referral and detailed reason for visit: Dermatology  Preference of office, provider, location: Rockey VEAR Hind, M.D. & Richarda Kubas, MD [facility] Address: [address] [place], [place] [address] Phone: [phone] Fax# [phone] If referral order, have you been seen by this specialty before? Yes (If Yes, this issue or another issue? When? Where? Patient has an appt 08/11/2024 and 08/24/2024 And 10/25/2024 Can we respond through MyChart? Yes

## 2024-06-17 NOTE — Telephone Encounter (Signed)
 Copied from CRM #[id number]. Topic: Referral - Request for Referral >> Jun 17, 2024  9:45 AM Charolett L wrote: Did the patient discuss referral with their provider in the last year? Yes (If No - schedule appointment) (If Yes - send message)  Appointment offered? Yes  Type of order/referral and detailed reason for visit: Ophthalmologist  Preference of office, provider, location: Selinda Slocumb Address: [address], [place], [place] [address] Phone: [phone] Fax# [phone]  If referral order, have you been seen by this specialty before? Yes (If Yes, this issue or another issue? When? Where? Patient has an appt 07/07/24 Can we respond through MyChart? Yes

## 2024-06-18 ENCOUNTER — Other Ambulatory Visit: Payer: Self-pay | Admitting: *Deleted

## 2024-06-18 DIAGNOSIS — Z79899 Other long term (current) drug therapy: Secondary | ICD-10-CM

## 2024-06-19 LAB — COMPREHENSIVE METABOLIC PANEL WITH GFR
AG Ratio: 1.7 (calc) (ref 1.0–2.5)
ALT: 20 U/L (ref 9–46)
AST: 29 U/L (ref 10–35)
Albumin: 4.2 g/dL (ref 3.6–5.1)
Alkaline phosphatase (APISO): 73 U/L (ref 35–144)
BUN: 11 mg/dL (ref 7–25)
CO2: 24 mmol/L (ref 20–32)
Calcium: 9 mg/dL (ref 8.6–10.3)
Chloride: 104 mmol/L (ref 98–110)
Creat: 1.1 mg/dL (ref 0.70–1.28)
Globulin: 2.5 g/dL (ref 1.9–3.7)
Glucose, Bld: 117 mg/dL — ABNORMAL HIGH (ref 65–99)
Potassium: 4 mmol/L (ref 3.5–5.3)
Sodium: 138 mmol/L (ref 135–146)
Total Bilirubin: 0.9 mg/dL (ref 0.2–1.2)
Total Protein: 6.7 g/dL (ref 6.1–8.1)
eGFR: 69 mL/min/1.73m2

## 2024-06-21 ENCOUNTER — Ambulatory Visit: Payer: Self-pay | Admitting: Physician Assistant

## 2024-06-21 NOTE — Progress Notes (Signed)
 Ok to continue reduced dose of methotrexate  2 tablets weekly as long as he is not experiencing any symptoms of a flare

## 2024-06-21 NOTE — Progress Notes (Signed)
 Glucose is 117, rest of CMP WNL.   AST has returned to WNL.

## 2024-07-14 ENCOUNTER — Other Ambulatory Visit: Payer: Self-pay | Admitting: Internal Medicine

## 2024-08-10 ENCOUNTER — Encounter: Admitting: Internal Medicine

## 2024-10-19 ENCOUNTER — Ambulatory Visit: Admitting: Physician Assistant

## 2025-04-21 ENCOUNTER — Ambulatory Visit
